# Patient Record
Sex: Female | Born: 1965
Health system: Southern US, Community
[De-identification: ages and names within clinical notes are randomized; demographics above are authoritative.]

## PROBLEM LIST (undated history)

## (undated) DIAGNOSIS — J849 Interstitial pulmonary disease, unspecified: Secondary | ICD-10-CM

## (undated) DIAGNOSIS — F32A Depression, unspecified: Secondary | ICD-10-CM

## (undated) DIAGNOSIS — M3320 Polymyositis, organ involvement unspecified: Secondary | ICD-10-CM

## (undated) DIAGNOSIS — G43909 Migraine, unspecified, not intractable, without status migrainosus: Secondary | ICD-10-CM

## (undated) DIAGNOSIS — M797 Fibromyalgia: Secondary | ICD-10-CM

## (undated) DIAGNOSIS — R011 Cardiac murmur, unspecified: Secondary | ICD-10-CM

## (undated) DIAGNOSIS — R0600 Dyspnea, unspecified: Secondary | ICD-10-CM

## (undated) DIAGNOSIS — E559 Vitamin D deficiency, unspecified: Secondary | ICD-10-CM

## (undated) DIAGNOSIS — I341 Nonrheumatic mitral (valve) prolapse: Secondary | ICD-10-CM

## (undated) DIAGNOSIS — I773 Arterial fibromuscular dysplasia: Secondary | ICD-10-CM

## (undated) DIAGNOSIS — R0902 Hypoxemia: Secondary | ICD-10-CM

## (undated) DIAGNOSIS — N920 Excessive and frequent menstruation with regular cycle: Secondary | ICD-10-CM

## (undated) DIAGNOSIS — R42 Dizziness and giddiness: Secondary | ICD-10-CM

## (undated) DIAGNOSIS — R7989 Other specified abnormal findings of blood chemistry: Secondary | ICD-10-CM

## (undated) DIAGNOSIS — R945 Abnormal results of liver function studies: Secondary | ICD-10-CM

## (undated) DIAGNOSIS — M069 Rheumatoid arthritis, unspecified: Secondary | ICD-10-CM

## (undated) DIAGNOSIS — I10 Essential (primary) hypertension: Secondary | ICD-10-CM

## (undated) DIAGNOSIS — E538 Deficiency of other specified B group vitamins: Secondary | ICD-10-CM

## (undated) DIAGNOSIS — M1990 Unspecified osteoarthritis, unspecified site: Secondary | ICD-10-CM

## (undated) DIAGNOSIS — M2550 Pain in unspecified joint: Secondary | ICD-10-CM

## (undated) DIAGNOSIS — J8410 Pulmonary fibrosis, unspecified: Secondary | ICD-10-CM

## (undated) DIAGNOSIS — G4730 Sleep apnea, unspecified: Secondary | ICD-10-CM

## (undated) DIAGNOSIS — F419 Anxiety disorder, unspecified: Secondary | ICD-10-CM

## (undated) HISTORY — DX: Rheumatoid arthritis, unspecified: M06.9

## (undated) HISTORY — DX: Migraine, unspecified, not intractable, without status migrainosus: G43.909

## (undated) HISTORY — DX: Nonrheumatic mitral (valve) prolapse: I34.1

## (undated) HISTORY — DX: Excessive and frequent menstruation with regular cycle: N92.0

## (undated) HISTORY — DX: Pain in unspecified joint: M25.50

## (undated) HISTORY — DX: Hypoxemia: R09.02

## (undated) HISTORY — DX: Essential (primary) hypertension: I10

## (undated) HISTORY — DX: Other specified abnormal findings of blood chemistry: R79.89

## (undated) HISTORY — DX: Anxiety disorder, unspecified: F41.9

## (undated) HISTORY — DX: Interstitial pulmonary disease, unspecified: J84.9

## (undated) HISTORY — DX: Sleep apnea, unspecified: G47.30

## (undated) HISTORY — DX: Dyspnea, unspecified: R06.00

## (undated) HISTORY — DX: Arterial fibromuscular dysplasia: I77.3

## (undated) HISTORY — DX: Polymyositis, organ involvement unspecified: M33.20

## (undated) HISTORY — DX: Depression, unspecified: F32.A

## (undated) HISTORY — DX: Abnormal results of liver function studies: R94.5

## (undated) HISTORY — DX: Cardiac murmur, unspecified: R01.1

## (undated) HISTORY — DX: Dizziness and giddiness: R42

## (undated) HISTORY — DX: Vitamin D deficiency, unspecified: E55.9

## (undated) HISTORY — DX: Fibromyalgia: M79.7

## (undated) HISTORY — DX: Pulmonary fibrosis, unspecified: J84.10

## (undated) HISTORY — DX: Unspecified osteoarthritis, unspecified site: M19.90

## (undated) HISTORY — DX: Deficiency of other specified B group vitamins: E53.8

---

## 2000-03-05 ENCOUNTER — Encounter: Admission: RE | Admit: 2000-03-05 | Discharge: 2000-03-05 | Payer: Self-pay | Admitting: Family Medicine

## 2000-03-05 ENCOUNTER — Encounter: Payer: Self-pay | Admitting: Family Medicine

## 2000-07-09 ENCOUNTER — Encounter: Admission: RE | Admit: 2000-07-09 | Discharge: 2000-07-09 | Payer: Self-pay | Admitting: Orthopedic Surgery

## 2000-07-09 ENCOUNTER — Encounter: Payer: Self-pay | Admitting: Orthopedic Surgery

## 2000-08-06 HISTORY — PX: CARDIAC CATHETERIZATION: SHX172

## 2000-09-20 ENCOUNTER — Ambulatory Visit (HOSPITAL_COMMUNITY): Admission: RE | Admit: 2000-09-20 | Discharge: 2000-09-20 | Payer: Self-pay | Admitting: Family Medicine

## 2001-05-05 ENCOUNTER — Observation Stay (HOSPITAL_COMMUNITY): Admission: EM | Admit: 2001-05-05 | Discharge: 2001-05-06 | Payer: Self-pay | Admitting: *Deleted

## 2001-05-05 ENCOUNTER — Encounter: Payer: Self-pay | Admitting: *Deleted

## 2001-05-30 ENCOUNTER — Ambulatory Visit (HOSPITAL_COMMUNITY): Admission: RE | Admit: 2001-05-30 | Discharge: 2001-05-30 | Payer: Self-pay | Admitting: *Deleted

## 2001-05-30 ENCOUNTER — Encounter: Payer: Self-pay | Admitting: *Deleted

## 2001-07-07 ENCOUNTER — Ambulatory Visit (HOSPITAL_COMMUNITY): Admission: RE | Admit: 2001-07-07 | Discharge: 2001-07-07 | Payer: Self-pay | Admitting: *Deleted

## 2001-07-07 ENCOUNTER — Encounter: Payer: Self-pay | Admitting: Family Medicine

## 2004-11-30 ENCOUNTER — Encounter: Admission: RE | Admit: 2004-11-30 | Discharge: 2004-11-30 | Payer: Self-pay | Admitting: Family Medicine

## 2005-02-27 ENCOUNTER — Encounter: Admission: RE | Admit: 2005-02-27 | Discharge: 2005-02-27 | Payer: Self-pay | Admitting: Family Medicine

## 2005-03-23 ENCOUNTER — Encounter: Admission: RE | Admit: 2005-03-23 | Discharge: 2005-03-23 | Payer: Self-pay | Admitting: Family Medicine

## 2005-03-28 ENCOUNTER — Ambulatory Visit: Payer: Self-pay | Admitting: Pulmonary Disease

## 2005-04-02 ENCOUNTER — Ambulatory Visit: Admission: RE | Admit: 2005-04-02 | Discharge: 2005-04-02 | Payer: Self-pay | Admitting: Pulmonary Disease

## 2005-04-10 ENCOUNTER — Ambulatory Visit: Payer: Self-pay | Admitting: Pulmonary Disease

## 2005-05-07 ENCOUNTER — Ambulatory Visit: Payer: Self-pay | Admitting: Pulmonary Disease

## 2005-06-13 ENCOUNTER — Ambulatory Visit: Payer: Self-pay | Admitting: Pulmonary Disease

## 2005-07-27 ENCOUNTER — Ambulatory Visit: Payer: Self-pay | Admitting: Gastroenterology

## 2005-08-08 ENCOUNTER — Ambulatory Visit: Payer: Self-pay | Admitting: Gastroenterology

## 2005-08-20 ENCOUNTER — Ambulatory Visit: Payer: Self-pay | Admitting: Pulmonary Disease

## 2005-11-13 ENCOUNTER — Encounter: Admission: RE | Admit: 2005-11-13 | Discharge: 2005-11-13 | Payer: Self-pay | Admitting: Rheumatology

## 2005-12-27 ENCOUNTER — Ambulatory Visit: Payer: Self-pay | Admitting: Pulmonary Disease

## 2007-03-28 ENCOUNTER — Encounter: Admission: RE | Admit: 2007-03-28 | Discharge: 2007-03-28 | Payer: Self-pay | Admitting: Rheumatology

## 2008-01-29 ENCOUNTER — Encounter: Admission: RE | Admit: 2008-01-29 | Discharge: 2008-01-29 | Payer: Self-pay | Admitting: Rheumatology

## 2008-02-10 DIAGNOSIS — J479 Bronchiectasis, uncomplicated: Secondary | ICD-10-CM | POA: Insufficient documentation

## 2008-02-11 ENCOUNTER — Ambulatory Visit: Payer: Self-pay | Admitting: Pulmonary Disease

## 2008-02-11 DIAGNOSIS — I059 Rheumatic mitral valve disease, unspecified: Secondary | ICD-10-CM | POA: Insufficient documentation

## 2008-02-11 DIAGNOSIS — M3320 Polymyositis, organ involvement unspecified: Secondary | ICD-10-CM | POA: Insufficient documentation

## 2008-02-17 ENCOUNTER — Ambulatory Visit: Payer: Self-pay | Admitting: Internal Medicine

## 2008-02-26 ENCOUNTER — Telehealth: Payer: Self-pay | Admitting: Pulmonary Disease

## 2008-02-29 ENCOUNTER — Emergency Department (HOSPITAL_COMMUNITY): Admission: EM | Admit: 2008-02-29 | Discharge: 2008-02-29 | Payer: Self-pay | Admitting: Emergency Medicine

## 2008-03-03 ENCOUNTER — Ambulatory Visit: Payer: Self-pay | Admitting: Pulmonary Disease

## 2008-03-05 ENCOUNTER — Ambulatory Visit: Payer: Self-pay | Admitting: Pulmonary Disease

## 2008-03-05 DIAGNOSIS — R42 Dizziness and giddiness: Secondary | ICD-10-CM | POA: Insufficient documentation

## 2008-04-20 ENCOUNTER — Ambulatory Visit: Payer: Self-pay | Admitting: Pulmonary Disease

## 2008-05-19 ENCOUNTER — Ambulatory Visit: Payer: Self-pay | Admitting: Pulmonary Disease

## 2008-07-06 LAB — CONVERTED CEMR LAB: Pap Smear: NEGATIVE

## 2008-07-16 ENCOUNTER — Encounter: Payer: Self-pay | Admitting: Pulmonary Disease

## 2008-08-02 ENCOUNTER — Ambulatory Visit: Payer: Self-pay | Admitting: Pulmonary Disease

## 2008-08-06 HISTORY — PX: OTHER SURGICAL HISTORY: SHX169

## 2008-08-31 ENCOUNTER — Encounter (HOSPITAL_COMMUNITY): Payer: Self-pay | Admitting: Obstetrics and Gynecology

## 2008-08-31 ENCOUNTER — Ambulatory Visit (HOSPITAL_COMMUNITY): Admission: RE | Admit: 2008-08-31 | Discharge: 2008-08-31 | Payer: Self-pay | Admitting: Obstetrics and Gynecology

## 2008-08-31 HISTORY — PX: DILATION AND CURETTAGE OF UTERUS: SHX78

## 2008-09-10 ENCOUNTER — Ambulatory Visit: Payer: Self-pay | Admitting: Pulmonary Disease

## 2008-09-10 ENCOUNTER — Ambulatory Visit: Payer: Self-pay | Admitting: Internal Medicine

## 2008-09-14 ENCOUNTER — Encounter: Payer: Self-pay | Admitting: Adult Health

## 2008-09-14 ENCOUNTER — Ambulatory Visit: Payer: Self-pay | Admitting: Pulmonary Disease

## 2008-09-17 ENCOUNTER — Telehealth: Payer: Self-pay | Admitting: Adult Health

## 2008-09-17 ENCOUNTER — Encounter (INDEPENDENT_AMBULATORY_CARE_PROVIDER_SITE_OTHER): Payer: Self-pay

## 2008-11-30 ENCOUNTER — Ambulatory Visit: Payer: Self-pay | Admitting: Pulmonary Disease

## 2008-12-01 ENCOUNTER — Ambulatory Visit: Payer: Self-pay | Admitting: Cardiology

## 2008-12-21 ENCOUNTER — Ambulatory Visit: Payer: Self-pay | Admitting: Pulmonary Disease

## 2009-02-22 ENCOUNTER — Encounter: Admission: RE | Admit: 2009-02-22 | Discharge: 2009-02-22 | Payer: Self-pay | Admitting: Orthopedic Surgery

## 2009-04-18 ENCOUNTER — Ambulatory Visit: Payer: Self-pay | Admitting: Pulmonary Disease

## 2009-04-19 LAB — CONVERTED CEMR LAB
ALT: 37 units/L — ABNORMAL HIGH (ref 0–35)
BUN: 10 mg/dL (ref 6–23)
Bilirubin, Direct: 0.1 mg/dL (ref 0.0–0.3)
Calcium: 9.2 mg/dL (ref 8.4–10.5)
Creatinine, Ser: 0.7 mg/dL (ref 0.4–1.2)
Eosinophils Absolute: 0.1 10*3/uL (ref 0.0–0.7)
Eosinophils Relative: 1.5 % (ref 0.0–5.0)
GFR calc non Af Amer: 117.21 mL/min (ref >60)
Glucose, Bld: 69 mg/dL — ABNORMAL LOW (ref 70–99)
Lymphocytes Relative: 16 % (ref 12.0–46.0)
MCHC: 33.6 g/dL (ref 30.0–36.0)
Monocytes Absolute: 0.6 10*3/uL (ref 0.1–1.0)
Neutro Abs: 6.5 10*3/uL (ref 1.4–7.7)
Platelets: 230 10*3/uL (ref 150.0–400.0)
Potassium: 3.7 meq/L (ref 3.5–5.1)
RBC: 4.41 M/uL (ref 3.87–5.11)
Total Protein: 7.7 g/dL (ref 6.0–8.3)

## 2009-05-25 ENCOUNTER — Ambulatory Visit: Payer: Self-pay | Admitting: Pulmonary Disease

## 2009-06-20 ENCOUNTER — Ambulatory Visit: Payer: Self-pay | Admitting: Internal Medicine

## 2009-06-20 DIAGNOSIS — M797 Fibromyalgia: Secondary | ICD-10-CM | POA: Insufficient documentation

## 2009-07-07 ENCOUNTER — Encounter: Payer: Self-pay | Admitting: Pulmonary Disease

## 2009-07-07 ENCOUNTER — Ambulatory Visit: Payer: Self-pay

## 2009-07-07 ENCOUNTER — Encounter: Payer: Self-pay | Admitting: Internal Medicine

## 2009-07-07 ENCOUNTER — Ambulatory Visit (HOSPITAL_COMMUNITY): Admission: RE | Admit: 2009-07-07 | Discharge: 2009-07-07 | Payer: Self-pay | Admitting: Internal Medicine

## 2009-07-07 ENCOUNTER — Ambulatory Visit: Payer: Self-pay | Admitting: Cardiology

## 2009-07-08 ENCOUNTER — Ambulatory Visit: Payer: Self-pay | Admitting: Internal Medicine

## 2009-07-08 LAB — CONVERTED CEMR LAB
Albumin: 3.9 g/dL (ref 3.5–5.2)
BUN: 7 mg/dL (ref 6–23)
Bilirubin Urine: NEGATIVE
Calcium: 9.3 mg/dL (ref 8.4–10.5)
Cholesterol: 158 mg/dL (ref 0–200)
Creatinine, Ser: 0.6 mg/dL (ref 0.4–1.2)
GFR calc non Af Amer: 139.89 mL/min (ref >60)
Glucose, Bld: 74 mg/dL (ref 70–99)
HDL: 58 mg/dL (ref >39.00)
Hemoglobin: 13 g/dL (ref 12.0–15.0)
Ketones, ur: NEGATIVE mg/dL
LDL Cholesterol: 93 mg/dL (ref 0–99)
MCHC: 33.5 g/dL (ref 30.0–36.0)
MCV: 88.4 fL (ref 78.0–100.0)
Potassium: 4.2 meq/L (ref 3.5–5.1)
RBC: 4.37 M/uL (ref 3.87–5.11)
RDW: 13.7 % (ref 11.5–14.6)
Specific Gravity, Urine: 1.01 (ref 1.000–1.030)
Total Bilirubin: 0.7 mg/dL (ref 0.3–1.2)
Total Protein: 7.4 g/dL (ref 6.0–8.3)
Triglycerides: 37 mg/dL (ref 0.0–149.0)
Urine Glucose: NEGATIVE mg/dL
VLDL: 7.4 mg/dL (ref 0.0–40.0)
Vit D, 25-Hydroxy: 48 ng/mL (ref 30–89)
WBC: 7.6 10*3/uL (ref 4.5–10.5)

## 2009-07-11 ENCOUNTER — Encounter: Payer: Self-pay | Admitting: Internal Medicine

## 2009-07-12 ENCOUNTER — Ambulatory Visit: Payer: Self-pay | Admitting: Internal Medicine

## 2009-07-12 DIAGNOSIS — Z8639 Personal history of other endocrine, nutritional and metabolic disease: Secondary | ICD-10-CM

## 2009-07-12 DIAGNOSIS — Z862 Personal history of diseases of the blood and blood-forming organs and certain disorders involving the immune mechanism: Secondary | ICD-10-CM | POA: Insufficient documentation

## 2009-07-12 LAB — CONVERTED CEMR LAB
HCV Ab: NEGATIVE
Hep A IgM: NEGATIVE
Hep B C IgM: NEGATIVE
Hepatitis B Surface Ag: NEGATIVE

## 2009-07-15 ENCOUNTER — Encounter: Payer: Self-pay | Admitting: Internal Medicine

## 2009-11-22 ENCOUNTER — Encounter: Payer: Self-pay | Admitting: Pulmonary Disease

## 2009-12-27 ENCOUNTER — Encounter: Payer: Self-pay | Admitting: Pulmonary Disease

## 2009-12-27 ENCOUNTER — Encounter: Payer: Self-pay | Admitting: Internal Medicine

## 2009-12-30 ENCOUNTER — Ambulatory Visit: Payer: Self-pay | Admitting: Pulmonary Disease

## 2010-02-15 ENCOUNTER — Ambulatory Visit: Payer: Self-pay | Admitting: Internal Medicine

## 2010-02-15 DIAGNOSIS — G43909 Migraine, unspecified, not intractable, without status migrainosus: Secondary | ICD-10-CM | POA: Insufficient documentation

## 2010-03-21 ENCOUNTER — Encounter: Payer: Self-pay | Admitting: Internal Medicine

## 2010-03-31 ENCOUNTER — Telehealth: Payer: Self-pay | Admitting: Internal Medicine

## 2010-05-26 ENCOUNTER — Ambulatory Visit: Payer: Self-pay | Admitting: Internal Medicine

## 2010-05-26 DIAGNOSIS — N3941 Urge incontinence: Secondary | ICD-10-CM | POA: Insufficient documentation

## 2010-05-26 LAB — CONVERTED CEMR LAB
Hemoglobin, Urine: NEGATIVE
Specific Gravity, Urine: 1.02 (ref 1.000–1.030)
Total Protein, Urine: NEGATIVE mg/dL
Urine Glucose: NEGATIVE mg/dL
pH: 5.5 (ref 5.0–8.0)

## 2010-06-01 ENCOUNTER — Ambulatory Visit: Payer: Self-pay | Admitting: Pulmonary Disease

## 2010-07-11 ENCOUNTER — Encounter: Payer: Self-pay | Admitting: Pulmonary Disease

## 2010-07-11 ENCOUNTER — Encounter: Payer: Self-pay | Admitting: Internal Medicine

## 2010-08-27 ENCOUNTER — Encounter: Payer: Self-pay | Admitting: Gastroenterology

## 2010-09-07 NOTE — Letter (Signed)
Summary: Aundra Dubin MD  Aundra Dubin MD   Imported By: Sherian Rein 12/21/2009 13:30:31  _____________________________________________________________________  External Attachment:    Type:   Image     Comment:   External Document

## 2010-09-07 NOTE — Letter (Signed)
Summary: [name] MD  [name] MD   Imported By: Sherian Rein 01/17/2010 10:44:36  _____________________________________________________________________  External Attachment:    Type:   Image     Comment:   External Document

## 2010-09-07 NOTE — Letter (Signed)
Summary: Aundra Dubin MD  Aundra Dubin MD   Imported By: Lester [place] 07/24/2010 07:47:06  _____________________________________________________________________  External Attachment:    Type:   Image     Comment:   External Document

## 2010-09-07 NOTE — Letter (Signed)
Summary: [name] MD  [name] MD   Imported By: Lennie Odor 07/20/2010 11:24:23  _____________________________________________________________________  External Attachment:    Type:   Image     Comment:   External Document

## 2010-09-07 NOTE — Letter (Signed)
Summary: [name] MD  [name] MD   Imported By: Sherian Rein 03/29/2010 13:44:51  _____________________________________________________________________  External Attachment:    Type:   Image     Comment:   External Document

## 2010-09-07 NOTE — Assessment & Plan Note (Signed)
Summary: 4-6 months/apc   Visit Type:  Follow-up Copy to:  [name] Primary Chelsei Mcchesney/Referring Jill Harper:  Newt Lukes MD  CC:  Bronchiectasis follow-up...the patient says her mucus production increased after she stopped the Prednisone therapy...she is not using Symbicort on a daily basis.  History of Present Illness: I saw [name] in follow up for her bronchiectasis, polymyositis, and dyspnea.  She has noticed some increase in cough and sputum since she stopped prednisone.  She is bringing up clear sputum.  She gets occasional chest tightness and wheeze with exertion.  She has not been using her symbicort on a regular basis.  She has not been using her proventil.  Last chest xray Dec 30, 2009.  Current Medications (verified): 1)  Symbicort 80-4.5 Mcg/act Aero (Budesonide-Formoterol Fumarate) .... Two Puffs Two Times A Day 2)  Proventil Hfa 108 (90 Base) Mcg/act  Aers (Albuterol Sulfate) .... 2 Puffs Qid As Needed 3)  Methotrexate 2.5 Mg  Tabs (Methotrexate Sodium) .... Take 8 Pills Every Saturday. 4)  Folic Acid 1 Mg  Tabs (Folic Acid) .... Take One Tablet By Mouth Once Daily 5)  Atenolol 25 Mg  Tabs (Atenolol) .... Take 1/2 Tab By Mouth Once Daily As Needed 6)  Tramadol Hcl 50 Mg Tabs (Tramadol Hcl) .[name].. 1 By Mouth 3-4 Times Daily As Needed 7)  Cyclobenzaprine Hcl 10 Mg  Tabs (Cyclobenzaprine Hcl) .... 1/2 Tab At Bedtime As Needed 8)  Midrin 325-65-100 Mg  Caps (Apap-Isometheptene-Dichloral) .... Tale One Capsule By Mouth At Onset of Headache; Repeat in 2 Hours. Max Is 8 Capsules Per Day. 9)  Meclizine Hcl 25 Mg  Chew (Meclizine Hcl) .... Take One Tablet By Mouth Every 6 Hours As Needed. 10)  Mucinex 600 Mg Xr12h-Tab (Guaifenesin) .... One By Mouth Two Times A Day As Needed Cough 11)  Imitrex 50 Mg Tabs (Sumatriptan Succinate) .[name].. 1 By Mouth At Onset of Migraine, May Repeat in 2hour If Needed - Max 200mg /24h 12)  Toviaz 4 Mg Xr24h-Tab (Fesoterodine Fumarate) .[name].. 1 By  Mouth Once Daily  Allergies (verified): 1)  ! Sulfa  Past History:  Past Medical History: Reviewed history from 05/26/2010 and no changes required. Bronchiectasis      - PFT 03/03/08 FEV1 3.18 (103%), FVC 3.83 (97%), FEV1% 83, TLC 4.85 (83%), DLCO 59%, no BD      - PFT 12/21/08 FEV1 3.22 (109%), FVC 3.71 (97%), FEV1% 87, TLC 4.68 (83%), DLCO 66%, no BD Polymyositis      - Dr. [name] MVP Fibromyalgia Abnormal liver function tests Menorrhagia OAB    MD roster - rheum - [facility] gyn-[facility] - Sood  Past Surgical History: Reviewed history from 05/25/2009 and no changes required. Normal cardiac cath 2002 D & C       - 08/31/08 Dr. Zelphia Cairo  Review of Systems       The patient complains of shortness of breath with activity, productive cough, and non-productive cough.  The patient denies shortness of breath at rest, coughing up blood, chest pain, difficulty swallowing, nasal congestion/difficulty breathing through nose, rash, change in color of mucus, and fever.    Vital Signs:  Patient profile:   45 year old female Height:      67 inches (170.18 cm) Weight:      215.38 pounds (97.90 kg) BMI:     33.86 O2 Sat:      98 % on Room air Temp:     98.2 degrees F (36.78 degrees C) oral  Pulse rate:   80 / minute BP sitting:   118 / 68  (left arm) Cuff size:   regular  Vitals Entered By: Michel Bickers CMA (June 01, 2010 2:32 PM)  O2 Sat at Rest %:  98 O2 Flow:  Room air CC: Bronchiectasis follow-up...the patient says her mucus production increased after she stopped the Prednisone therapy...she is not using Symbicort on a daily basis Comments Medications reviewed with patient Michel Bickers [facility]  June 01, 2010 2:33 PM   Physical Exam  General:  normal appearance and obese.   Nose:  no sinus tenderness, prominent turbinates Mouth:  MP 3, 2+ tonsills Neck:  no JVD.   Lungs:  decreased breath sounds, basilar crackles partially clear with cough, no  wheezing Heart:  regular rhythm, normal rate, and no murmurs.   Extremities:  no edema Cervical Nodes:  no significant adenopathy Psych:  alert and cooperative; normal mood and affect; normal attention span and concentration   Impression & Recommendations:  Problem # 1:  BRONCHIECTASIS (ICD-494.0) Will stop symbicort and restart Qvar.  Explained instructions on how to use this.  Will use proventil as needed.  Will give flu shot today.  Problem # 2:  POLYMYOSITIS (ICD-710.4)  She is maintained on MTX er rheumatology.  Medications Added to Medication List This Visit: 1)  Qvar 80 Mcg/act Aers (Beclomethasone dipropionate) .... Two puffs two times a day for 2 weeks, and if okay then 2 puffs at bedtime  Complete Medication List: 1)  Proventil Hfa 108 (90 Base) Mcg/act Aers (Albuterol sulfate) .... 2 puffs qid as needed 2)  Methotrexate 2.5 Mg Tabs (Methotrexate sodium) .... Take 8 pills every saturday. 3)  Folic Acid 1 Mg Tabs (Folic acid) .... Take one tablet by mouth once daily 4)  Atenolol 25 Mg Tabs (Atenolol) .... Take 1/2 tab by mouth once daily as needed 5)  Tramadol Hcl 50 Mg Tabs (Tramadol hcl) .[name].. 1 by mouth 3-4 times daily as needed 6)  Cyclobenzaprine Hcl 10 Mg Tabs (Cyclobenzaprine hcl) .... 1/2 tab at bedtime as needed 7)  Midrin 325-65-100 Mg Caps (Apap-isometheptene-dichloral) .... Tale one capsule by mouth at onset of headache; repeat in 2 hours. max is 8 capsules per day. 8)  Meclizine Hcl 25 Mg Chew (Meclizine hcl) .... Take one tablet by mouth every 6 hours as needed. 9)  Mucinex 600 Mg Xr12h-tab (Guaifenesin) .... One by mouth two times a day as needed cough 10)  Imitrex 50 Mg Tabs (Sumatriptan succinate) .[name].. 1 by mouth at onset of migraine, may repeat in 2hour if needed - max 200mg /24h 11)  Toviaz 4 Mg Xr24h-tab (Fesoterodine fumarate) .[name].. 1 by mouth once daily 12)  Qvar 80 Mcg/act Aers (Beclomethasone dipropionate) .... Two puffs two times a day for 2 weeks, and if  okay then 2 puffs at bedtime  Other Orders: Est. Patient Level III ([address])  Patient Instructions: 1)  Stop symbicort 2)  Qvar two puffs two times a day for two weeks, and if okay then two puffs at bedtime.  Rinse your mouth after using Qvar 3)  Proventil two puffs as needed  4)  Flu shot today 5)  Follow up in 4 to 6 months Prescriptions: PROVENTIL HFA 108 (90 BASE) MCG/ACT  AERS (ALBUTEROL SULFATE) 2 puffs qid as needed  #1 x 6   Entered and Authorized by:   [name] MD   Signed by:   [name] MD on 06/01/2010   Method used:   Electronically to  [facility] [address] [phone]* (retail)       [address]       [address]       [place], [place]  [address]       Ph: [phone]       Fax: [phone]   RxID:   [id number] QVAR 80 MCG/ACT AERS (BECLOMETHASONE DIPROPIONATE) two puffs two times a day for 2 weeks, and if okay then 2 puffs at bedtime  #1 x 6   Entered and Authorized by:   [name] MD   Signed by:   [name] MD on 06/01/2010   Method used:   Electronically to        [facility] [phone]* (retail)       [address]       [address]       [place], [place]  [address]       Ph: [phone]       Fax: [phone]   RxID:   [id number]   Appended Document: flu update Flu Vaccine Consent Questions     Do you have a history of severe allergic reactions to this vaccine? no    Any prior history of allergic reactions to egg and/or gelatin? no    Do you have a sensitivity to the preservative Thimersol? no    Do you have a past history of Guillan-Barre Syndrome? no    Do you currently have an acute febrile illness? no    Have you ever had a severe reaction to latex? no    Vaccine information given and explained to patient? yes    Are you currently pregnant? no    Lot Number:[id number]   Exp Date:02/03/2011   Site Given  Left Deltoid IM  Mindy Silva  June 01, 2010 3:00 PM    Clinical Lists Changes  Orders: Added new Service  order of Admin 1st Vaccine (17616) - Signed Added new Service order of Flu Vaccine 56yrs + [phone]) - Signed Observations: Added new observation of FLU VAX VIS: 02/28/10 version (06/01/2010 14:59) Added new observation of FLU VAXLOT: [id number] (06/01/2010 14:59) Added new observation of FLU VAXMFR: [employer] (06/01/2010 14:59) Added new observation of FLU VAX EXP: 02/03/2011 (06/01/2010 14:59) Added new observation of FLU VAX DSE: 0.73ml (06/01/2010 14:59) Added new observation of FLU VAX: Fluvax 3+ (06/01/2010 14:59)

## 2010-09-07 NOTE — Assessment & Plan Note (Signed)
Summary: rov/apc   Visit Type:  Follow-up Copy to:  [name] Primary Provider/Referring Provider:  Newt Lukes MD  CC:  Bronchiectasis.  The patient c/o occ SOB. No change in her cough or mucus production. She says she is only using the Symbicort once a week and is not using her flutter valve.[name]  History of Present Illness: I saw [name] in follow up for her bronchiectasis, polymyositis, and dyspnea.  She was doing okay with her breathing until a few weeks ago.  This was associated with an attempt to decrease her MTX dose.  She developed worsening muscle pain as well.  She has been having trouble with her LFT's and this was felt to be related to MTX.  She was started on prednisone, and will remain on this through June.  She gets occasional cough.  She will bring up sputum occasionally and this is clear to yellow.  She is not having fever, chest pain, or wheezing.  She only uses her symbicort sporadically, and has not been using her proventil.    Current Medications (verified): 1)  Symbicort 80-4.5 Mcg/act Aero (Budesonide-Formoterol Fumarate) .... Two Puffs Two Times A Day 2)  Proventil Hfa 108 (90 Base) Mcg/act  Aers (Albuterol Sulfate) .... 2 Puffs Qid As Needed 3)  Methotrexate 2.5 Mg  Tabs (Methotrexate Sodium) .... Take 8 Pills Every Saturday. 4)  Folic Acid 1 Mg  Tabs (Folic Acid) .... Take One Tablet By Mouth Once Daily 5)  Atenolol 25 Mg  Tabs (Atenolol) .... Take 1/2 Tab By Mouth Once Daily As Needed 6)  Tramadol Hcl 50 Mg Tabs (Tramadol Hcl) .[name].. 1 By Mouth 3-4 Times Daily As Needed 7)  Cyclobenzaprine Hcl 10 Mg  Tabs (Cyclobenzaprine Hcl) .... 1/2 Tab At Bedtime As Needed 8)  Midrin 325-65-100 Mg  Caps (Apap-Isometheptene-Dichloral) .... Tale One Capsule By Mouth At Onset of Headache; Repeat in 2 Hours. Max Is 8 Capsules Per Day. 9)  Meclizine Hcl 25 Mg  Chew (Meclizine Hcl) .... Take One Tablet By Mouth Every 6 Hours As Needed. 10)  Mucinex 600 Mg Xr12h-Tab  (Guaifenesin) .... One By Mouth Two Times A Day As Needed Cough 11)  Prednisone 5 Mg Tabs (Prednisone) .[name].. 10 Mg Every Other Day Alternating With 5 Mg Every Other Day  Allergies (verified): 1)  ! Sulfa  Past History:  Past Medical History: Bronchiectasis      - PFT 03/03/08 FEV1 3.18 (103%), FVC 3.83 (97%), FEV1% 83, TLC 4.85 (83%), DLCO 59%, no BD      - PFT 12/21/08 FEV1 3.22 (109%), FVC 3.71 (97%), FEV1% 87, TLC 4.68 (83%), DLCO 66%, no BD Polymyositis      - Dr. [name] MVP Fibromyalgia Abnormal liver function tests Menorrhagia  MD rooster - rheum - [facility]low gyn-[facility] - Jolyn Deshmukh  Past Surgical History: Reviewed history from 05/25/2009 and no changes required. Normal cardiac cath 2002 D & C       - 08/31/08 Dr. Zelphia Cairo  Vital Signs:  Patient profile:   45 year old female Height:      67 inches (170.18 cm) Weight:      220 pounds (100.00 kg) BMI:     34.58 O2 Sat:      99 % on Room air Temp:     98.3 degrees F (36.83 degrees C) oral Pulse rate:   83 / minute BP sitting:   110 / 74  (left arm) Cuff size:   regular  Vitals Entered  By: Michel Bickers CMA (Dec 30, 2009 3:10 PM)  O2 Sat at Rest %:  99 O2 Flow:  Room air CC: Bronchiectasis.  The patient c/o occ SOB. No change in her cough or mucus production. She says she is only using the Symbicort once a week and is not using her flutter valve. Comments Medications reviewed. Daytime phone number verified. Michel Bickers CMA  Dec 30, 2009 3:11 PM   Physical Exam  General:  normal appearance and obese.   Nose:  no sinus tenderness, prominent turbinates Mouth:  MP 3, 2+ tonsills Neck:  no JVD.   Lungs:  decreased breath sounds, basilar crackles, no wheezing Heart:  regular rhythm, normal rate, and no murmurs.   Extremities:  no edema Cervical Nodes:  no significant adenopathy   Impression & Recommendations:  Problem # 1:  BRONCHIECTASIS (ICD-494.0) She may be getting benefit from her  prednisone use.  I will repeat her chest xray today.  She can use symbicort and proventil as needed.  Advised her to monitor her symptoms as she comes down on the dose of her prednisone.  Problem # 2:  POLYMYOSITIS (ICD-710.4) She will f/u with rheumatology.  Medications Added to Medication List This Visit: 1)  Tramadol Hcl 50 Mg Tabs (Tramadol hcl) .[name].. 1 by mouth 3-4 times daily as needed 2)  Prednisone 5 Mg Tabs (Prednisone) .[name].. 10 mg every other day alternating with 5 mg every other day  Complete Medication List: 1)  Symbicort 80-4.5 Mcg/act Aero (Budesonide-formoterol fumarate) .... Two puffs two times a day 2)  Proventil Hfa 108 (90 Base) Mcg/act Aers (Albuterol sulfate) .... 2 puffs qid as needed 3)  Methotrexate 2.5 Mg Tabs (Methotrexate sodium) .... Take 8 pills every saturday. 4)  Folic Acid 1 Mg Tabs (Folic acid) .... Take one tablet by mouth once daily 5)  Atenolol 25 Mg Tabs (Atenolol) .... Take 1/2 tab by mouth once daily as needed 6)  Tramadol Hcl 50 Mg Tabs (Tramadol hcl) .[name].. 1 by mouth 3-4 times daily as needed 7)  Cyclobenzaprine Hcl 10 Mg Tabs (Cyclobenzaprine hcl) .... 1/2 tab at bedtime as needed 8)  Midrin 325-65-100 Mg Caps (Apap-isometheptene-dichloral) .... Tale one capsule by mouth at onset of headache; repeat in 2 hours. max is 8 capsules per day. 9)  Meclizine Hcl 25 Mg Chew (Meclizine hcl) .... Take one tablet by mouth every 6 hours as needed. 10)  Mucinex 600 Mg Xr12h-tab (Guaifenesin) .... One by mouth two times a day as needed cough 11)  Prednisone 5 Mg Tabs (Prednisone) .[name].. 10 mg every other day alternating with 5 mg every other day  Other Orders: Est. Patient Level III (53664) T-2 View CXR (71020TC)  Patient Instructions: 1)  Chest xray today 2)  Follow up in 4 to 6 months

## 2010-09-07 NOTE — Letter (Signed)
Summary: Aundra Dubin MD  Aundra Dubin MD   Imported By: Lester Smyer 01/13/2010 08:15:38  _____________________________________________________________________  External Attachment:    Type:   Image     Comment:   External Document

## 2010-09-07 NOTE — Assessment & Plan Note (Signed)
Summary: problem controlling bladder/cd   Vital Signs:  Patient profile:   45 year old female Height:      67 inches Weight:      212 pounds BMI:     33.32 O2 Sat:      99 % on Room air Temp:     98.7 degrees F oral Pulse rate:   79 / minute BP sitting:   112 / 70  (left arm) Cuff size:   regular  Vitals Entered By: Alysia Penna (May 26, 2010 11:14 AM)  O2 Flow:  Room air CC: pt c/o of periods of not being able to control her bladder. /cp sma   Primary Care Provider:  Newt Lukes MD  CC:  pt c/o of periods of not being able to control her bladder. /cp sma.  History of Present Illness: c/o overactive bladder symptoms  onset 3 mo ago, course progressive episodes occur 1x/week or more describes as sudden urge assoc with episode of incontinence driving home due to symptoms  no dysuria or hematuria no recent change in meds -  no uro intervention or surg in past 12 mo no back pain, no leg weakness or numbness in "saddle"  Clinical Review Panels:  Immunizations   Last Tetanus Booster:  Tdap (07/12/2009)   Last Flu Vaccine:  Historical (05/15/2009)   Last Pneumovax:  Historical (08/07/2007)  CBC   WBC:  7.6 (07/08/2009)   RBC:  4.37 (07/08/2009)   Hgb:  13.0 (07/08/2009)   Hct:  38.6 (07/08/2009)   Platelets:  239.0 (07/08/2009)   MCV  88.4 (07/08/2009)   MCHC  33.5 (07/08/2009)   RDW  13.7 (07/08/2009)   PMN:  68.3 (07/08/2009)   Lymphs:  23.0 (07/08/2009)   Monos:  6.2 (07/08/2009)   Eosinophils:  1.8 (07/08/2009)   Basophil:  0.7 (07/08/2009)  Complete Metabolic Panel   Glucose:  74 (07/08/2009)   Sodium:  141 (07/08/2009)   Potassium:  4.2 (07/08/2009)   Chloride:  105 (07/08/2009)   CO2:  29 (07/08/2009)   BUN:  7 (07/08/2009)   Creatinine:  0.6 (07/08/2009)   Albumin:  3.9 (07/08/2009)   Total Protein:  7.4 (07/08/2009)   Calcium:  9.3 (07/08/2009)   Total Bili:  0.7 (07/08/2009)   Alk Phos:  62 (07/08/2009)   SGPT (ALT):  108  (07/08/2009)   SGOT (AST):  70 (07/08/2009)   Current Medications (verified): 1)  Symbicort 80-4.5 Mcg/act Aero (Budesonide-Formoterol Fumarate) .... Two Puffs Two Times A Day 2)  Proventil Hfa 108 (90 Base) Mcg/act  Aers (Albuterol Sulfate) .... 2 Puffs Qid As Needed 3)  Methotrexate 2.5 Mg  Tabs (Methotrexate Sodium) .... Take 8 Pills Every Saturday. 4)  Folic Acid 1 Mg  Tabs (Folic Acid) .... Take One Tablet By Mouth Once Daily 5)  Atenolol 25 Mg  Tabs (Atenolol) .... Take 1/2 Tab By Mouth Once Daily As Needed 6)  Tramadol Hcl 50 Mg Tabs (Tramadol Hcl) .[name].. 1 By Mouth 3-4 Times Daily As Needed 7)  Cyclobenzaprine Hcl 10 Mg  Tabs (Cyclobenzaprine Hcl) .... 1/2 Tab At Bedtime As Needed 8)  Midrin 325-65-100 Mg  Caps (Apap-Isometheptene-Dichloral) .... Tale One Capsule By Mouth At Onset of Headache; Repeat in 2 Hours. Max Is 8 Capsules Per Day. 9)  Meclizine Hcl 25 Mg  Chew (Meclizine Hcl) .... Take One Tablet By Mouth Every 6 Hours As Needed. 10)  Mucinex 600 Mg Xr12h-Tab (Guaifenesin) .... One By Mouth Two Times A Day As Needed  Cough 11)  Imitrex 50 Mg Tabs (Sumatriptan Succinate) .[name].. 1 By Mouth At Onset of Migraine, May Repeat in 2hour If Needed - Max 200mg /24h  Allergies (verified): 1)  ! Sulfa  Past History:  Past Medical History: Bronchiectasis      - PFT 03/03/08 FEV1 3.18 (103%), FVC 3.83 (97%), FEV1% 83, TLC 4.85 (83%), DLCO 59%, no BD      - PFT 12/21/08 FEV1 3.22 (109%), FVC 3.71 (97%), FEV1% 87, TLC 4.68 (83%), DLCO 66%, no BD Polymyositis      - Dr. [name] MVP Fibromyalgia Abnormal liver function tests Menorrhagia OAB    MD roster - rheum - Truslow gyn-[facility] - Sood  Review of Systems  The patient denies weight loss, abdominal pain, and hematuria.    Physical Exam  General:  overweight-appearing.  alert, well-developed, well-nourished, and cooperative to examination.    Lungs:  normal respiratory effort, no intercostal retractions or use  of accessory muscles; normal breath sounds bilaterally - no crackles and no wheezes.    Heart:  normal rate, regular rhythm, no murmur, and no rub. BLE without edema.    Impression & Recommendations:  Problem # 1:  INCONTINENCE, URGE (ICD-788.31) OAB symptoms evident by hx -  check urine studies and treat if +infx start toviaz trial - samples and erx done if unimproved, refer for urodynamic testing - uro or ?gyn ([facility]) Orders: TLB-Udip w/ Micro (81001-URINE) T-Culture, Urine ([id number]) Prescription Created Electronically [id number])  Complete Medication List: 1)  Symbicort 80-4.5 Mcg/act Aero (Budesonide-formoterol fumarate) .... Two puffs two times a day 2)  Proventil Hfa 108 (90 Base) Mcg/act Aers (Albuterol sulfate) .... 2 puffs qid as needed 3)  Methotrexate 2.5 Mg Tabs (Methotrexate sodium) .... Take 8 pills every saturday. 4)  Folic Acid 1 Mg Tabs (Folic acid) .... Take one tablet by mouth once daily 5)  Atenolol 25 Mg Tabs (Atenolol) .... Take 1/2 tab by mouth once daily as needed 6)  Tramadol Hcl 50 Mg Tabs (Tramadol hcl) .[name].. 1 by mouth 3-4 times daily as needed 7)  Cyclobenzaprine Hcl 10 Mg Tabs (Cyclobenzaprine hcl) .... 1/2 tab at bedtime as needed 8)  Midrin 325-65-100 Mg Caps (Apap-isometheptene-dichloral) .... Tale one capsule by mouth at onset of headache; repeat in 2 hours. max is 8 capsules per day. 9)  Meclizine Hcl 25 Mg Chew (Meclizine hcl) .... Take one tablet by mouth every 6 hours as needed. 10)  Mucinex 600 Mg Xr12h-tab (Guaifenesin) .... One by mouth two times a day as needed cough 11)  Imitrex 50 Mg Tabs (Sumatriptan succinate) .[name].. 1 by mouth at onset of migraine, may repeat in 2hour if needed - max 200mg /24h 12)  Toviaz 4 Mg Xr24h-tab (Fesoterodine fumarate) .[name].. 1 by mouth once daily  Patient Instructions: 1)  it was good to see you today. 2)  test(s) ordered today - your results will be posted on the phone tree for review in 48-72 hours from the time of  test completion; call [phone] and enter your 9 digit MRN (listed above on this page, just below your name); if any changes need to be made or there are abnormal results, you will be contacted directly. 3)  start Toviaz for overactive bladder symptoms (urge incontence) - samples + coupon given today and your prescription has been electronically submitted to your pharmacy. Please take as directed. Contact our office if you believe you're having problems with the medication(s).  4)  If continued symptoms despite medication, let us know  and we can refer to urologist for "urodynamic" testing to help with this problem Prescriptions: TOVIAZ 4 MG XR24H-TAB (FESOTERODINE FUMARATE) 1 by mouth once daily  #30 x 3   Entered and Authorized by:   Newt Lukes MD   Signed by:   Newt Lukes MD on 05/26/2010   Method used:   Electronically to        [facility] [phone]* (retail)       [address]       [address]       [place], [place]  [address]       Ph: [phone]       Fax: [phone]   RxID:   [id number]    Orders Added: 1)  Est. Patient Level IV [64403] 2)  TLB-Udip w/ Micro [81001-URINE] 3)  T-Culture, Urine [[id number]] 4)  Prescription Created Electronically [id number]

## 2010-09-07 NOTE — Assessment & Plan Note (Signed)
Summary: migraines/cd   Vital Signs:  Patient profile:   45 year old female Height:      67 inches (170.18 cm) Weight:      215.12 pounds (97.78 kg) O2 Sat:      98 % on Room air Temp:     98.9 degrees F (37.17 degrees C) oral Pulse rate:   97 / minute BP sitting:   110 / 72  (left arm) Cuff size:   large  Vitals Entered By: Orlan Leavens (February 15, 2010 1:40 PM)  O2 Flow:  Room air CC: migraines, Headaches Is Patient Diabetic? No Pain Assessment Patient in pain? no        Primary Care Provider:  Newt Lukes MD  CC:  migraines and Headaches.  History of Present Illness:  Headaches      This is a 45 year old woman who presents with Headaches.  The symptoms began 1 week ago.  On a scale of 1 to 10, the intensity is described as a 5.  +prior hx migraines but none in recent years - prev relieved with midrin rx.  The patient reports photophobia, but denies nausea, vomiting, nasal congestion, sinus pain, and sinus pressure.  The headache is described as intermittent and sharp.  The location of the pain is bitemporal.  The patient denies the following high-risk features: fever, neck pain/stiffness, vision loss or change, focal weakness, altered mental status, rash, trauma, pain worse with exertion, new type of headache, and age >50 years.  The headaches are precipitated by change in weather.  Prior treatment has included no medication and a narcotic.    Current Medications (verified): 1)  Symbicort 80-4.5 Mcg/act Aero (Budesonide-Formoterol Fumarate) .... Two Puffs Two Times A Day 2)  Proventil Hfa 108 (90 Base) Mcg/act  Aers (Albuterol Sulfate) .... 2 Puffs Qid As Needed 3)  Methotrexate 2.5 Mg  Tabs (Methotrexate Sodium) .... Take 8 Pills Every Saturday. 4)  Folic Acid 1 Mg  Tabs (Folic Acid) .... Take One Tablet By Mouth Once Daily 5)  Atenolol 25 Mg  Tabs (Atenolol) .... Take 1/2 Tab By Mouth Once Daily As Needed 6)  Tramadol Hcl 50 Mg Tabs (Tramadol Hcl) .[name].. 1 By Mouth 3-4  Times Daily As Needed 7)  Cyclobenzaprine Hcl 10 Mg  Tabs (Cyclobenzaprine Hcl) .... 1/2 Tab At Bedtime As Needed 8)  Midrin 325-65-100 Mg  Caps (Apap-Isometheptene-Dichloral) .... Tale One Capsule By Mouth At Onset of Headache; Repeat in 2 Hours. Max Is 8 Capsules Per Day. 9)  Meclizine Hcl 25 Mg  Chew (Meclizine Hcl) .... Take One Tablet By Mouth Every 6 Hours As Needed. 10)  Mucinex 600 Mg Xr12h-Tab (Guaifenesin) .... One By Mouth Two Times A Day As Needed Cough 11)  Prednisone 5 Mg Tabs (Prednisone) .[name].. 10 Mg Every Other Day Alternating With 5 Mg Every Other Day  Allergies (verified): 1)  ! Sulfa  Past History:  Past Medical History: Bronchiectasis      - PFT 03/03/08 FEV1 3.18 (103%), FVC 3.83 (97%), FEV1% 83, TLC 4.85 (83%), DLCO 59%, no BD      - PFT 12/21/08 FEV1 3.22 (109%), FVC 3.71 (97%), FEV1% 87, TLC 4.68 (83%), DLCO 66%, no BD Polymyositis      - Dr. [name] MVP Fibromyalgia Abnormal liver function tests Menorrhagia  MD roster - rheum - Truslow gyn-[facility]kins pulmonary - Sood  Review of Systems  The patient denies vision loss, chest pain, and syncope.    Physical Exam  General:  overweight-appearing.  alert, well-developed, well-nourished, and cooperative to examination.    Lungs:  normal respiratory effort, no intercostal retractions or use of accessory muscles; normal breath sounds bilaterally - no crackles and no wheezes.    Heart:  normal rate, regular rhythm, no murmur, and no rub. BLE without edema.  Neurologic:  alert & oriented X3 and cranial nerves II-XII symetrically intact.  strength normal in all extremities, sensation intact to light touch, and gait normal. speech fluent without dysarthria or aphasia; follows commands with good comprehension.    Impression & Recommendations:  Problem # 1:  MIGRAINE HEADACHE (ICD-346.90)  hx same - reports prior neuro eval including MRI neg - neuro exam benign good relief in past with midrin - not avaiable  at this time will try imitrex with NSAID at the start of each episode - erx done if worse or not improved, refer to headache center for further eval and yx - pt agrees Her updated medication list for this problem includes:    Atenolol 25 Mg Tabs (Atenolol) .[name]... Take 1/2 tab by mouth once daily as needed    Tramadol Hcl 50 Mg Tabs (Tramadol hcl) .[name]... 1 by mouth 3-4 times daily as needed    Midrin 325-65-100 Mg Caps (Apap-isometheptene-dichloral) .[name]... Tale one capsule by mouth at onset of headache; repeat in 2 hours. max is 8 capsules per day.    Imitrex 50 Mg Tabs (Sumatriptan succinate) .[name]... 1 by mouth at onset of migraine, may repeat in 2hour if needed - max 200mg /24h  Headache diary reviewed.  Orders: Prescription Created Electronically [phone])  Complete Medication List: 1)  Symbicort 80-4.5 Mcg/act Aero (Budesonide-formoterol fumarate) .... Two puffs two times a day 2)  Proventil Hfa 108 (90 Base) Mcg/act Aers (Albuterol sulfate) .... 2 puffs qid as needed 3)  Methotrexate 2.5 Mg Tabs (Methotrexate sodium) .... Take 8 pills every saturday. 4)  Folic Acid 1 Mg Tabs (Folic acid) .... Take one tablet by mouth once daily 5)  Atenolol 25 Mg Tabs (Atenolol) .... Take 1/2 tab by mouth once daily as needed 6)  Tramadol Hcl 50 Mg Tabs (Tramadol hcl) .[name].. 1 by mouth 3-4 times daily as needed 7)  Cyclobenzaprine Hcl 10 Mg Tabs (Cyclobenzaprine hcl) .... 1/2 tab at bedtime as needed 8)  Midrin 325-65-100 Mg Caps (Apap-isometheptene-dichloral) .... Tale one capsule by mouth at onset of headache; repeat in 2 hours. max is 8 capsules per day. 9)  Meclizine Hcl 25 Mg Chew (Meclizine hcl) .... Take one tablet by mouth every 6 hours as needed. 10)  Mucinex 600 Mg Xr12h-tab (Guaifenesin) .... One by mouth two times a day as needed cough 11)  Prednisone 5 Mg Tabs (Prednisone) .[name].. 10 mg every other day alternating with 5 mg every other day 12)  Imitrex 50 Mg Tabs (Sumatriptan succinate) .[name].. 1 by mouth  at onset of migraine, may repeat in 2hour if needed - max 200mg /24h  Patient Instructions: 1)  it was good to see you today. 2)  try imitrex for your migriane symptoms as discussed - take with ibuprofen or naproxene for antinflammatiry relief as needed  3)  your prescriptions have been electronically submitted to your pharmacy. Please take as directed. Contact our office if you believe you're having problems with the medication(s).  4)  if continued or worsening symptoms, call and we can make referral to neurology or try different medications as needed  Prescriptions: IMITREX 50 MG TABS (SUMATRIPTAN SUCCINATE) 1 by mouth at onset of  migraine, may repeat in 2hour if needed - max 200mg /24h  #12 x 1   Entered and Authorized by:   Newt Lukes MD   Signed by:   Newt Lukes MD on 02/15/2010   Method used:   Electronically to        [facility] [phone]* (retail)       [address]       [address]       [place], [place]  [address]       Ph: [phone]       Fax: [phone]   RxID:   [id number]

## 2010-09-07 NOTE — Progress Notes (Signed)
Summary: Call Report  Phone Note Other Incoming   Caller: [facility] Summary of Call: [facility] Triage Call Report Triage Record Num: [id number] Operator: Coralee North Royal Patient Name: Jill Harper Call Date & Time: 03/30/2010 6:57:54PM Patient Phone: [phone] PCP: Rene Paci Patient Gender: Female PCP Fax : [phone] Patient DOB: [birth year] Practice Name: [facility] Reason for Call: Pt calling about thinking she was dehydrated and has been drinking but is feeling worse. Onset: 8/25, afebrile. When she stands to walk she starts having so much muscle aches that she sits back down. Advised to f/u with office for an appt within 72 hrs. Protocol(s) Used: Muscle Pain Recommended Outcome per Protocol: See Provider within 72 Hours Reason for Outcome: Impairs ability to perform activities of daily living (ADLs) Care Advice:  ~ Maintain good posture. Maintain physical activity through regular weight bearing mild exercise such as walking 20-30 minutes, 3 to 4 times per week if approved by provider.  ~  ~ SYMPTOM / CONDITION MANAGEMENT 03/30/2010 7:10:18PM Page 1 of 1 [email or web address] Initial call taken by: Margaret Pyle, CMA,  March 31, 2010 8:10 AM  Follow-up for Phone Call        noted - Follow-up by: Newt Lukes MD,  March 31, 2010 9:21 AM

## 2010-10-03 ENCOUNTER — Encounter: Payer: Self-pay | Admitting: Pulmonary Disease

## 2010-10-03 ENCOUNTER — Ambulatory Visit (INDEPENDENT_AMBULATORY_CARE_PROVIDER_SITE_OTHER): Payer: Federal, State, Local not specified - PPO | Admitting: Pulmonary Disease

## 2010-10-03 DIAGNOSIS — J479 Bronchiectasis, uncomplicated: Secondary | ICD-10-CM

## 2010-10-03 DIAGNOSIS — M3320 Polymyositis, organ involvement unspecified: Secondary | ICD-10-CM

## 2010-10-12 NOTE — Assessment & Plan Note (Signed)
Summary: rov/jd   Copy to:  [name] Primary Provider/Referring Provider:  Newt Lukes MD  CC:  follow up. Pt states her breathing has been doing "pretty good". Pt c/o chest tightness x couple days and cough w/ dark yellow phlem. [name]  History of Present Illness: 45 yo female with bronchiectasis, polymyositis, and dyspnea.  She has occasional cough with yellow sputum.  She has noticed the sputum is a little more dark in color.  She denies wheeze or chest pain.  She has not had fever.  Her sinuses and throat have been okay.  She is not using proventil much.    Current Medications (verified): 1)  Proventil Hfa 108 (90 Base) Mcg/act  Aers (Albuterol Sulfate) .... 2 Puffs Qid As Needed 2)  Methotrexate 2.5 Mg  Tabs (Methotrexate Sodium) .... Take 8 Pills Every Saturday. 3)  Folic Acid 1 Mg  Tabs (Folic Acid) .... Take One Tablet By Mouth Once Daily 4)  Atenolol 25 Mg  Tabs (Atenolol) .... Take 1/2 Tab By Mouth Once Daily As Needed 5)  Tramadol Hcl 50 Mg Tabs (Tramadol Hcl) .[name].. 1 By Mouth 3-4 Times Daily As Needed 6)  Cyclobenzaprine Hcl 10 Mg  Tabs (Cyclobenzaprine Hcl) .... 1/2 Tab At Bedtime As Needed 7)  Midrin 325-65-100 Mg  Caps (Apap-Isometheptene-Dichloral) .... Tale One Capsule By Mouth At Onset of Headache; Repeat in 2 Hours. Max Is 8 Capsules Per Day. 8)  Meclizine Hcl 25 Mg  Chew (Meclizine Hcl) .... Take One Tablet By Mouth Every 6 Hours As Needed. 9)  Mucinex 600 Mg Xr12h-Tab (Guaifenesin) .... One By Mouth Two Times A Day As Needed Cough 10)  Imitrex 50 Mg Tabs (Sumatriptan Succinate) .[name].. 1 By Mouth At Onset of Migraine, May Repeat in 2hour If Needed - Max 200mg /24h 11)  Qvar 80 Mcg/act Aers (Beclomethasone Dipropionate) .... Two Puffs Two Times A Day For 2 Weeks, and If Okay Then 2 Puffs At Bedtime  Allergies (verified): 1)  ! Sulfa  Past History:  Past Medical History: Reviewed history from 05/26/2010 and no changes required. Bronchiectasis      - PFT  03/03/08 FEV1 3.18 (103%), FVC 3.83 (97%), FEV1% 83, TLC 4.85 (83%), DLCO 59%, no BD      - PFT 12/21/08 FEV1 3.22 (109%), FVC 3.71 (97%), FEV1% 87, TLC 4.68 (83%), DLCO 66%, no BD Polymyositis      - Dr. [name] MVP Fibromyalgia Abnormal liver function tests Menorrhagia OAB    MD roster - rheum - Tr[facility]low gyn-[facility] pulmonary - Rhia Blatchford  Past Surgical History: Reviewed history from 05/25/2009 and no changes required. Normal cardiac cath 2002 D & C       - 08/31/08 Dr. Zelphia Cairo  Vital Signs:  Patient profile:   45 year old female Height:      67 inches Weight:      205.25 pounds BMI:     32.26 O2 Sat:      99 % on Room air Temp:     98.2 degrees F oral Pulse rate:   82 / minute BP sitting:   108 / 78  (left arm) Cuff size:   large  Vitals Entered By: Carver Fila (October 03, 2010 12:04 PM)  O2 Flow:  Room air CC: follow up. Pt states her breathing has been doing "pretty good". Pt c/o chest tightness x couple days, cough w/ dark yellow phlem.  Comments meds and allergies updated Phone number updated Carver Fila  October 03, 2010 12:06  PM    Physical Exam  General:  normal appearance and obese.   Nose:  no sinus tenderness, prominent turbinates Mouth:  MP 3, 2+ tonsills Neck:  no JVD.   Lungs:  decreased breath sounds, no raales, no wheezing Heart:  regular rhythm, normal rate, and no murmurs.   Extremities:  no edema Neurologic:  normal CN II-XII.   Cervical Nodes:  no significant adenopathy   Impression & Recommendations:  Problem # 1:  BRONCHIECTASIS (ICD-494.0) Advised her to use proventil more and continue Qvar.  She will call to update her status, and then decide if she needs to restart Symbicort.  Problem # 2:  POLYMYOSITIS (ICD-710.4)  She is maintained on MTX er rheumatology.  Complete Medication List: 1)  Proventil Hfa 108 (90 Base) Mcg/act Aers (Albuterol sulfate) .... 2 puffs qid as needed 2)  Methotrexate 2.5 Mg Tabs  (Methotrexate sodium) .... Take 8 pills every saturday. 3)  Folic Acid 1 Mg Tabs (Folic acid) .... Take one tablet by mouth once daily 4)  Atenolol 25 Mg Tabs (Atenolol) .... Take 1/2 tab by mouth once daily as needed 5)  Tramadol Hcl 50 Mg Tabs (Tramadol hcl) .[name].. 1 by mouth 3-4 times daily as needed 6)  Cyclobenzaprine Hcl 10 Mg Tabs (Cyclobenzaprine hcl) .... 1/2 tab at bedtime as needed 7)  Midrin 325-65-100 Mg Caps (Apap-isometheptene-dichloral) .... Tale one capsule by mouth at onset of headache; repeat in 2 hours. max is 8 capsules per day. 8)  Meclizine Hcl 25 Mg Chew (Meclizine hcl) .... Take one tablet by mouth every 6 hours as needed. 9)  Mucinex 600 Mg Xr12h-tab (Guaifenesin) .... One by mouth two times a day as needed cough 10)  Imitrex 50 Mg Tabs (Sumatriptan succinate) .[name].. 1 by mouth at onset of migraine, may repeat in 2hour if needed - max 200mg /24h 11)  Qvar 80 Mcg/act Aers (Beclomethasone dipropionate) .... Two puffs two times a day for 2 weeks, and if okay then 2 puffs at bedtime  Other Orders: Est. Patient Level III (16109)  Patient Instructions: 1)  Try using proventil more as needed 2)  Follow up in 6 months

## 2010-11-20 LAB — CBC
HCT: 31.3 % — ABNORMAL LOW (ref 36.0–46.0)
MCHC: 33 g/dL (ref 30.0–36.0)
Platelets: 257 10*3/uL (ref 150–400)
RBC: 3.73 MIL/uL — ABNORMAL LOW (ref 3.87–5.11)

## 2010-12-19 NOTE — Op Note (Signed)
NAMEMARIBETH, Jill Harper                  ACCOUNT NO.:  [id number]   MEDICAL RECORD NO.:  [id number]          PATIENT TYPE:  AMB   LOCATION:  SDC                           FACILITY:  [facility]   PHYSICIAN:  Zelphia Cairo, MD    DATE OF BIRTH:  1966-02-16   DATE OF PROCEDURE:  08/31/2008  DATE OF DISCHARGE:                               OPERATIVE REPORT   PREOPERATIVE DIAGNOSIS:  Menorrhagia.   POSTOPERATIVE DIAGNOSIS:  Menorrhagia, path pending.   PROCEDURES:  1. Cervical block.  2. Hysteroscopy.  3. Dilation and curettage.  4. NovaSure ablation.   SURGEON:  Zelphia Cairo, MD   ASSISTANT:  None.   ANESTHESIA:  Local with general.   SPECIMEN:  Endometrial curettings to Pathology.   ESTIMATED BLOOD LOSS:  Minimal.   COMPLICATIONS:  None.   CONDITION:  Stable to recovery room.   PROCEDURE:  The patient was taken to the operating room where anesthesia  was found to be adequate.  She was placed in the dorsal lithotomy  position using Allen stirrups.  She was prepped and draped in sterile  fashion and a catheter was used to drain her bladder sterilely.  Bivalve  speculum was placed in the vagina and a single-tooth tenaculum was  placed on the anterior lip of the cervix.  Cervical block was provided  using 1% Nesacaine.  The uterine cavity sounded to 8 cm.  The cervical  length was 4 cm.  Hysteroscopy was inserted under direct visualization  and a survey of the endometrial cavity was performed.  Bilateral ostia  were visualized and appeared normal.  Uterine cavity appeared normal.  There was fluffy thickened tissue noted on the anterior lower uterine  segment of the uterus.  Hysteroscope was removed and a gentle curetting  was performed.  Specimen was placed on Telfa and passed off to be sent  to Pathology.   The NovaSure device was then inserted into the endometrial cavity and  NovaSure ablation was performed using standard manufacture guidelines.  Once the cycle time was  complete, the NovaSure device was removed.  Single-tooth tenaculum was removed.  Cervix was found to be hemostatic,  and the patient was taken to the recovery room in stable condition.  Sponge, lap, needle, and instrument counts were correct x2.      Zelphia Cairo, MD  Electronically Signed     GA/MEDQ  D:  08/31/2008  T:  08/31/2008  Job:  [id number]

## 2010-12-22 NOTE — Discharge Summary (Signed)
[place]  Patient:    Jill Harper, Jill Harper Visit Number: [id number] MRN: [id number]          Service Type: MED Location: 2000 2010 01 Attending Physician:  Veneda Melter Dictated by:   Tereso Newcomer, P.A. Admit Date:  05/05/2001 Discharge Date: 05/06/2001   CC:         Duffy Rhody C. Andrey Campanile, M.D.   Discharge Summary  DATE OF BIRTH:  1965/12/05  DISCHARGE DIAGNOSES: 1. Chest pain, etiology unclear. 2. Chest CT this admission revealing shotty mediastinal and hilar lymph nodes    and definite enlarged lymph nodes by CT criteria within the chest.    Scattered small peripheral nodular opacities within both lungs dependently.    These are nonspecific and could represent inflammatory process.  Recommend    followup in three months. 3. History of mitral valve prolapse by report. 4. Migraine headaches. 5. Family history of premature coronary artery disease. 6. Depression.  HOSPITAL COURSE:  This 45 year old female was sent to the emergency room by her primary care physician, Dr. Duffy Rhody C. Wilson, on May 05, 2001 for the evaluation of chest pain.  Patient has a long history of chest pain associated with her mitral valve prolapse.  Over the three to four days prior to admission, she had noticed substernal chest pain that was similar to what she had experienced in the past with her mitral valve prolapse.  She had substernal tightness that was worse with exertion and better with rest. Instead of taking her atenolol 25 mg one-half tablet, she took one whole tablet q.d. for the past three to four days prior to admission.  She thought this would help.  Her pain worsened.  She reported being unable to do minimal exertion without getting chest pain (for example, walking down the hallway in her home).  She also noted associated shortness of breath and radiation down bilateral upper extremities and into her neck and diaphoresis but no nausea. She had also  noticed decreased exercise tolerance of late.  She was seen in her primary care physicians office on the date of admission and then was sent to the emergency room for further evaluation.  She denied any recent prolonged trips.  She denied any pleuritic chest pain.  She did note that her chest pain got worse when she had her arms extended above her head for a prolonged period of time.  She denied any syncope but did feel dizzy over the past few days. On initial exam, her blood pressure was 112/69, pulse 48 to 55.  Neck without JVD.  Cardiac:  S1 click, S2, regular rate and rhythm with no murmurs.  Lungs clear to auscultation.  Extremities without edema.  Abdomen was soft with mild epigastric tenderness, positive bowel sounds.  Chest wall with positive pain to palpation in the substernal and pectoral regions, especially on the right. EKG showed sinus bradycardia with a heart rate of 54 and nonspecific ST-T wave changes.  Chest x-ray showed no acute disease.  She was admitted and ruled out for MI by enzymes.  Her CK-MB and troponin Is were both negative x 3. D-dimer was elevated at 1.13.  She was sent for a chest CT to rule out pulmonary embolism.  This showed no evidence of pulmonary embolus or DVT. There was also some bibasilar atelectasis/scarring noted.  Also, there were shotty mediastinal and hilar lymph nodes and definite enlarged lymph nodes by CT criteria within the chest as noted in the  discharge diagnoses above. Patient was seen on May 06, 2001.  She was still complaining of chest pain while moving around in her room.  She was seen by Dr. Corinda Gubler on this date. It was decided to place the patient on ibuprofen 600 mg q.8h. x 5 to 7 days for possible chest wall pain.  She would need a followup chest CT in three months.  ANA and erythrocyte sedimentation rate were also ordered and are pending at the time of this dictation.  It was felt that she could be discharged to home with followup  stress-only Cardiolite in the office on Friday, October 4th, at 1:15 p.m.  She would need close followup with her primary care physician, Dr. Andrey Campanile, as well.  LABORATORY DATA:  WBC 7600, hemoglobin 13.2, hematocrit 39, platelet count 257,000, MCV 80.3, normal differential.  INR 1.1.  D-dimer as noted above 1.13.  Sodium 140, potassium 3.7, chloride 108, CO2 27, glucose 91, BUN 6, creatinine 0.8, total bilirubin 0.9, alkaline phosphatase 50, AST 20, ALT 19, total protein 8.0, albumin 3.9, calcium 8.9.  Cardiac enzymes as noted above. TSH 4.729.  Pending at the time of this dictation:  Lipid profile, ANA and sed rate.  DISCHARGE MEDICATIONS: 1. Atenolol 25 mg one-half daily. 2. Paxil 20 mg q.d. 3. Coated aspirin 325 mg q.d. 4. Ibuprofen 600 mg q.8h. x 5 to 7 days.  ACTIVITY:  Activity is as tolerated.  DIET:  Low fat, low sodium.  FOLLOWUP:  The patient is to follow up with Dr. Andrey Campanile in 7 to 10 days and she should call for an appointment; she will also follow up in our office for a stress-only Cardiolite on Friday, October 4th, at 1:15 p.m.  FOLLOWUP CONSIDERATIONS: 1. The patient needs a followup chest CT in three months to be compared to the    scan that was done on May 05, 2001.  Please see the above discharge    diagnoses for the complete report from that chest CT. 2. The patient has an erythrocyte sedimentation rate and ANA test pending at    the time of this dictation.  She will need to follow up with her primary    care physician for these issues. 3. As noted above, she has a stress test on Friday, October 4th, to rule out    ischemic heart disease. Dictated by:   Tereso Newcomer, P.A. Attending Physician:  Veneda Melter DD:  05/06/01 TD:  05/06/01 Job: [id number]

## 2010-12-22 NOTE — Cardiovascular Report (Signed)
[place]  Patient:    Jill Harper, Jill Harper Visit Number: [id number] MRN: [id number]          Service Type: CAT Location: [facility] 2899 13 Attending Physician:  Veneda Melter Dictated by:   Daisey Must, M.D. [facility] Proc. Date: 05/30/01 Admit Date:  05/30/2001 Discharge Date: 05/30/2001   CC:         Duffy Rhody C. Andrey Campanile, M.D.  Veneda Melter, M.D.  Cardiac Catheterization Laboratory   Cardiac Catheterization  PROCEDURES PERFORMED: Left heart catheterization with coronary angiography and left ventriculography.  INDICATIONS: The patient is a 45 year old woman, with a history of mitral valve prolapse and chest pain. She has had recurrent episodes of substernal chest pain. A stress Cardiolite showed no evidence of ischemia. However, because of her recurrent chest pain, it was felt best to proceed with cardiac catheterization to rule out coronary artery disease.  DESCRIPTION OF PROCEDURE: A 6 French sheath was placed in the right femoral artery. Standard Judkins 6 French catheters were utilized. Contrast was Omnipaque. There were no complications.  RESULTS:  HEMODYNAMICS: Left ventricular pressure 122/12, aortic pressure 116/72. There was no aortic valve gradient.  LEFT VENTRICULOGRAM: Wall motion is normal. Ejection fraction calculated at 58%. There is no mitral regurgitation.  CORONARY ARTERIOGRAPHY: (Left dominant).  Left main: Normal.  Left anterior descending: The left anterior descending artery is a large vessel which wraps around the apex and supplies the distal portion of the inferior septal wall. The LAD is free of angiographic disease.  Left circumflex: The left circumflex is a dominant vessel. It gives rise to a normal sized branching ramus intermedius, small marginal branch, small posterolateral branch and a small posterior descending artery. The left circumflex is free of angiographic disease.  Right coronary artery: The right  coronary artery is a small nondominant vessel. It is free of angiographic disease.  IMPRESSIONS: 1. Normal left ventricular systolic function. 2. Normal coronary arteries by angiography. Dictated by:   Daisey Must, M.D. [facility] Attending Physician:  Veneda Melter DD:  05/30/01 TD:  06/02/01 Job: [id number]

## 2011-01-25 ENCOUNTER — Encounter: Payer: Self-pay | Admitting: Adult Health

## 2011-01-25 ENCOUNTER — Ambulatory Visit (INDEPENDENT_AMBULATORY_CARE_PROVIDER_SITE_OTHER): Payer: Federal, State, Local not specified - PPO | Admitting: Adult Health

## 2011-01-25 DIAGNOSIS — J479 Bronchiectasis, uncomplicated: Secondary | ICD-10-CM

## 2011-01-25 MED ORDER — AMOXICILLIN-POT CLAVULANATE 875-125 MG PO TABS: 1.0000 | ORAL_TABLET | Freq: Two times a day (BID) | ORAL | Status: AC

## 2011-01-25 NOTE — Progress Notes (Signed)
  Subjective:    Patient ID: Jill Harper, female    DOB: [birth year], 45 y.o.   MRN: [id number]  HPI 45 yo female with bronchiectasis, polymyositis, and dyspnea.  01/25/11 Acute OV  Pt complains of increased cough and congestion x couple of weeks.   Mucus colored changed to dark yellow and Mccravy x 3 days ago.  Slight increased SOB with exertion.  Mild wheezing at times. Mucus is very thick at times. OTC not helping.  She is on Methotrexate weekly by Rheumatology.   Review of Systems Constitutional:   No  weight loss, night sweats,  Fevers, chills, fatigue, or  lassitude.  HEENT:   No headaches,  Difficulty swallowing,  Tooth/dental problems, or  Sore throat,                No sneezing, itching, ear ache, nasal congestion, post nasal drip,   CV:  No chest pain,  Orthopnea, PND, swelling in lower extremities, anasarca, dizziness, palpitations, syncope.   GI  No heartburn, indigestion, abdominal pain, nausea, vomiting, diarrhea, change in bowel habits, loss of appetite, bloody stools.   Resp:  No coughing up of blood.     No chest wall deformity  Skin: no rash or lesions.  GU: no dysuria, change in color of urine, no urgency or frequency.  No flank pain, no hematuria      Psych:  No change in mood or affect. No depression or anxiety.  No memory loss.         Objective:   Physical Exam GEN: A/Ox3; pleasant , NAD, well nourished   HEENT:  [place]/AT,  EACs-clear, TMs-wnl, NOSE-clear, THROAT-clear, no lesions, no postnasal drip or exudate noted.   NECK:  Supple w/ fair ROM; no JVD; normal carotid impulses w/o bruits; no thyromegaly or nodules palpated; no lymphadenopathy.  RESP  Coarse BS w/ no wheezing  no accessory muscle use, no dullness to percussion  CARD:  RRR, no m/r/g  , no peripheral edema, pulses intact, no cyanosis or clubbing.  GI:   Soft & nt; nml bowel sounds; no organomegaly or masses detected.  Musco: Warm bil, no deformities or joint swelling noted.   Neuro:  alert, no focal deficits noted.    Skin: Warm, no lesions or rashes         Assessment & Plan:

## 2011-01-25 NOTE — Patient Instructions (Signed)
Augmentin 875mg  Twice daily  For 10 days  Mucinex DM Twice daily  As needed   Fluids and rest  Please contact office for sooner follow up if symptoms do not improve or worsen or seek emergency care  follow up Dr. Craige Cotta as planned and As needed

## 2011-01-25 NOTE — Assessment & Plan Note (Addendum)
Flare   Plan  Augmentin 875mg  Twice daily  For 10 days  Mucinex DM Twice daily  As needed   Fluids and rest  Please contact office for sooner follow up if symptoms do not improve or worsen or seek emergency care  follow up Dr. Craige Cotta as planned and As needed

## 2011-01-26 NOTE — Progress Notes (Signed)
Reviewed, and agree with plan.

## 2011-01-30 ENCOUNTER — Encounter: Payer: Self-pay | Admitting: Internal Medicine

## 2011-03-13 ENCOUNTER — Ambulatory Visit (INDEPENDENT_AMBULATORY_CARE_PROVIDER_SITE_OTHER): Payer: Federal, State, Local not specified - PPO | Admitting: Pulmonary Disease

## 2011-03-13 ENCOUNTER — Encounter: Payer: Self-pay | Admitting: Pulmonary Disease

## 2011-03-13 DIAGNOSIS — J479 Bronchiectasis, uncomplicated: Secondary | ICD-10-CM

## 2011-03-13 DIAGNOSIS — M3320 Polymyositis, organ involvement unspecified: Secondary | ICD-10-CM

## 2011-03-13 NOTE — Patient Instructions (Signed)
Follow up in 6 months 

## 2011-03-13 NOTE — Assessment & Plan Note (Signed)
She is to continue MTX per Dr. Truslow. 

## 2011-03-13 NOTE — Assessment & Plan Note (Signed)
She has recovered from episode of bronchitis in June.  She is to continue her current inhaler regimen.

## 2011-03-13 NOTE — Progress Notes (Signed)
  Subjective:    Patient ID: CHYENNE SOBCZAK, female    DOB: 1965/11/29, 45 y.o.   MRN: [id number]  HPI CC: [name]  45 yo female with bronchiectasis in the setting of polymyositis.  She was treated for a bronchitis in June.  She has improved.  She gets occasional rattle in her chest, and this improves with proventil.  She is not needing to use proventil much.  She is not having much sputum.  She denies hemoptysis, fever, or chest pain.  She was not able to tolerate lowering of her MTX dose.  She had lab work last week with Dr. Linus Galas.  Review of Systems     Objective:   Physical Exam  Blood pressure 124/72, pulse 63, temperature 97.2 F (36.2 C), temperature source Oral, height 5' 7.5" (1.715 m), weight 208 lb 3.2 oz (94.439 kg), SpO2 100.00%.  HEENT - no sinus tenderness, no oral exudate, no LAN Cardiac - s1s2 regular, no murmur Chest - no wheeze/rales Abd - soft, non-tender Ext - no edema Neuro - normal strength, CN intact Psych - normal mood, behavior      Assessment & Plan:

## 2011-04-24 ENCOUNTER — Other Ambulatory Visit: Payer: Self-pay | Admitting: *Deleted

## 2011-04-24 MED ORDER — ATENOLOL 25 MG PO TABS: 12.5000 mg | ORAL_TABLET | Freq: Every day | ORAL | Status: DC | PRN

## 2011-04-24 NOTE — Telephone Encounter (Signed)
Pt left msg on vm Monday needing new rx for her Atenolol. Old rx has expired. Tried to call pt back this am was out of office on yesterday. Left msg sending 30 day to [facility]/[place] she is overdue for yearly physical..Marland KitchenMarland Kitchen9/18/12@10 :53am/LMB

## 2011-05-04 ENCOUNTER — Encounter: Payer: Self-pay | Admitting: Adult Health

## 2011-05-04 ENCOUNTER — Ambulatory Visit (INDEPENDENT_AMBULATORY_CARE_PROVIDER_SITE_OTHER)
Admission: RE | Admit: 2011-05-04 | Discharge: 2011-05-04 | Disposition: A | Discharge status: Home / Self Care | Payer: Federal, State, Local not specified - PPO | Source: Ambulatory Visit | Attending: Adult Health | Admitting: Adult Health

## 2011-05-04 ENCOUNTER — Ambulatory Visit (INDEPENDENT_AMBULATORY_CARE_PROVIDER_SITE_OTHER): Payer: Federal, State, Local not specified - PPO | Admitting: Adult Health

## 2011-05-04 ENCOUNTER — Encounter: Payer: Self-pay | Admitting: *Deleted

## 2011-05-04 DIAGNOSIS — J479 Bronchiectasis, uncomplicated: Secondary | ICD-10-CM

## 2011-05-04 LAB — POCT I-STAT, CHEM 8
BUN: 10
Calcium, Ion: 1.17
Chloride: 107
Creatinine, Ser: 0.8
Glucose, Bld: 87
HCT: 37
Hemoglobin: 12.6
Potassium: 4
Sodium: 141
TCO2: 25

## 2011-05-04 LAB — DIFFERENTIAL
Basophils Relative: 1
Eosinophils Absolute: 0
Eosinophils Relative: 0
Lymphs Abs: 1.9
Monocytes Relative: 8

## 2011-05-04 LAB — CBC
HCT: 37.1
MCHC: 32.8
MCV: 87.7
Platelets: 254
RBC: 4.23
WBC: 9.5

## 2011-05-04 MED ORDER — CEFDINIR 300 MG PO CAPS: 300.0000 mg | ORAL_CAPSULE | Freq: Two times a day (BID) | ORAL | Status: AC

## 2011-05-04 NOTE — Progress Notes (Signed)
Reviewed and agree with plan.

## 2011-05-04 NOTE — Patient Instructions (Signed)
Omnicef 300mg   Twice daily  For 10 days  Mucinex DM Twice daily  As needed   Fluids and rest  Please contact office for sooner follow up if symptoms do not improve or worsen or seek emergency care  follow up Dr. Craige Cotta as planned and As needed

## 2011-05-04 NOTE — Progress Notes (Signed)
  Subjective:    Patient ID: Jill Harper, female    DOB: 04/11/1966, 45 y.o.   MRN: [id number]  HPI  CC: [name]  45 yo female with bronchiectasis in the setting of polymyositis.  03/13/11  Follow up OV  She was treated for a bronchitis in June.  She has improved.  She gets occasional rattle in her chest, and this improves with proventil.  She is not needing to use proventil much.  She is not having much sputum.  She denies hemoptysis, fever, or chest pain.  She was not able to tolerate lowering of her MTX dose.  She had lab work last week with Dr. Linus Galas. >>no changes   05/04/2011 Acute OV  Complains of  cough w/ [place] phlem, chest congestion, chest tightness for  4 days .  Pt also states she feels extremely tired. OTC not helping. Did have trace of blood specs in mucus initially.  No chest pain or edema. Appetite is good. No recent travel or abx  Use .   Review of Systems Constitutional:   No  weight loss, night sweats,  Fevers, chills,  +fatigue, or  lassitude.  HEENT:   No headaches,  Difficulty swallowing,  Tooth/dental problems, or  Sore throat,                + sneezing nasal congestion, post nasal drip,   CV:  No chest pain,  Orthopnea, PND, swelling in lower extremities, anasarca, dizziness, palpitations, syncope.   GI  No heartburn, indigestion, abdominal pain, nausea, vomiting, diarrhea, change in bowel habits, loss of appetite, bloody stools.   Resp:   No coughing up of blood.     No chest wall deformity  Skin: no rash or lesions.  GU: no dysuria, change in color of urine, no urgency or frequency.  No flank pain, no hematuria   MS:  No joint pain or swelling.    Psych:  No change in mood or affect. No depression or anxiety.  No memory loss.          Objective:   Physical Exam GEN: A/Ox3; pleasant , NAD, well nourished   HEENT:  [place]/AT,  EACs-clear, TMs-wnl, NOSE-clear, THROAT-clear, no lesions, no postnasal drip or exudate noted.   NECK:  Supple w/  fair ROM; no JVD; normal carotid impulses w/o bruits; no thyromegaly or nodules palpated; no lymphadenopathy.  RESP  Coarse BS w/o wheezing no accessory muscle use, no dullness to percussion  CARD:  RRR, no m/r/g  , no peripheral edema, pulses intact, no cyanosis or clubbing.  GI:   Soft & nt; nml bowel sounds; no organomegaly or masses detected.  Musco: Warm bil, no deformities or joint swelling noted.   Neuro: alert, no focal deficits noted.    Skin: Warm, no lesions or rashes    Blood pressure 120/68, pulse 76, temperature 98.2 F (36.8 C), temperature source Oral, height 5' 7.5" (1.715 m), weight 214 lb (97.07 kg), SpO2 99.00%.        Assessment & Plan:

## 2011-05-04 NOTE — Assessment & Plan Note (Signed)
Flare  xray pending.   Plan;  Omnicef 300mg   Twice daily  For 10 days  Mucinex DM Twice daily  As needed   Fluids and rest  Please contact office for sooner follow up if symptoms do not improve or worsen or seek emergency care  follow up Dr. Craige Cotta as planned and As needed

## 2011-05-11 ENCOUNTER — Telehealth: Payer: Self-pay | Admitting: *Deleted

## 2011-05-11 DIAGNOSIS — Z0000 Encounter for general adult medical examination without abnormal findings: Secondary | ICD-10-CM

## 2011-05-11 NOTE — Telephone Encounter (Signed)
Received staff msg pt made appt for cpx in November. Needing labs entered in EPIC....05/11/11@2 :48pm/LMB

## 2011-06-14 ENCOUNTER — Other Ambulatory Visit (INDEPENDENT_AMBULATORY_CARE_PROVIDER_SITE_OTHER): Payer: Federal, State, Local not specified - PPO

## 2011-06-14 DIAGNOSIS — Z0000 Encounter for general adult medical examination without abnormal findings: Secondary | ICD-10-CM

## 2011-06-14 LAB — BASIC METABOLIC PANEL
CO2: 28 mEq/L (ref 19–32)
Chloride: 107 mEq/L (ref 96–112)
Creatinine, Ser: 0.6 mg/dL (ref 0.4–1.2)
Sodium: 140 mEq/L (ref 135–145)

## 2011-06-14 LAB — CBC WITH DIFFERENTIAL/PLATELET
Basophils Absolute: 0 10*3/uL (ref 0.0–0.1)
Hemoglobin: 12.9 g/dL (ref 12.0–15.0)
Lymphocytes Relative: 21 % (ref 12.0–46.0)
Monocytes Relative: 8 % (ref 3.0–12.0)
Neutro Abs: 5.6 10*3/uL (ref 1.4–7.7)
RBC: 4.22 Mil/uL (ref 3.87–5.11)
RDW: 13.4 % (ref 11.5–14.6)
WBC: 8.1 10*3/uL (ref 4.5–10.5)

## 2011-06-14 LAB — URINALYSIS, ROUTINE W REFLEX MICROSCOPIC
Ketones, ur: NEGATIVE
Leukocytes, UA: NEGATIVE
Specific Gravity, Urine: 1.015 (ref 1.000–1.030)
Total Protein, Urine: NEGATIVE
Urine Glucose: NEGATIVE
pH: 5.5 (ref 5.0–8.0)

## 2011-06-14 LAB — HEPATIC FUNCTION PANEL
AST: 21 U/L (ref 0–37)
Alkaline Phosphatase: 52 U/L (ref 39–117)
Bilirubin, Direct: 0.1 mg/dL (ref 0.0–0.3)
Total Bilirubin: 0.8 mg/dL (ref 0.3–1.2)

## 2011-06-14 LAB — LIPID PANEL: Total CHOL/HDL Ratio: 2

## 2011-06-18 ENCOUNTER — Ambulatory Visit (INDEPENDENT_AMBULATORY_CARE_PROVIDER_SITE_OTHER): Payer: Federal, State, Local not specified - PPO | Admitting: Internal Medicine

## 2011-06-18 ENCOUNTER — Encounter: Payer: Self-pay | Admitting: Internal Medicine

## 2011-06-18 VITALS — BP 110/72 | HR 92 | Temp 99.1°F | Resp 14 | Ht 68.0 in | Wt 215.5 lb

## 2011-06-18 DIAGNOSIS — Z0000 Encounter for general adult medical examination without abnormal findings: Secondary | ICD-10-CM

## 2011-06-18 DIAGNOSIS — Z136 Encounter for screening for cardiovascular disorders: Secondary | ICD-10-CM

## 2011-06-18 NOTE — Progress Notes (Signed)
Subjective:    Patient ID: Jill Harper, female    DOB: Feb 09, 1966, 45 y.o.   MRN: [id number]  HPI  patient is here today for annual physical. Patient feels well and has no complaints.  Past Medical History  Diagnosis Date  . Bronchiectasis     PFT 03-03-08 FEV1 3.18 (103%), FVC 3,83 (97%), FEV1% 83, TLC 4,85 (83%),DLCO 59%, no BD. PFT 12-21-08 FEV1 3.22 (109%), FVC 3.71 (97%), FEV1% 87, TLC 4.68 (83%), DLCO 66%, no BD  . Polymyositis     Dr [name]; chronic steroids  . MVP (mitral valve prolapse)   . Fibromyalgia   . Abnormal liver function test   . Menorrhagia   . OAB (overactive bladder)   . MIGRAINE HEADACHE    Family History  Problem Relation Age of Onset  . Diabetes Mother   . Fibromyalgia Mother   . Ulcers Mother    History  Substance Use Topics  . Smoking status: Never Smoker   . Smokeless tobacco: Never Used   Comment: Married, lives with spouse. 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 Alcohol Use: No    Review of Systems Constitutional: Negative for fever or weight change.  Respiratory: Negative for cough and shortness of breath.   Cardiovascular: Negative for chest pain or palpitations.  Gastrointestinal: Negative for abdominal pain, no bowel changes.  Musculoskeletal: Negative for gait problem or joint swelling.  Skin: Negative for rash.  Neurological: Negative for dizziness or headache.  No other specific complaints in a complete review of systems (except as listed in HPI above).     Objective:   Physical Exam BP 110/72  Pulse 92  Temp(Src) 99.1 F (37.3 C) (Oral)  Resp 14  Ht 5\' 8"  (1.727 m)  Wt 215 lb 8 oz (97.75 kg)  BMI 32.77 kg/m2  SpO2 99% Wt Readings from Last 3 Encounters:  06/18/11 215 lb 8 oz (97.75 kg)  05/04/11 214 lb (97.07 kg)  03/13/11 208 lb 3.2 oz (94.439 kg)   Constitutional: She is overweight; appears well-developed and well-nourished. No distress.  HENT: Head: Normocephalic and  atraumatic. Ears: B TMs ok, no erythema or effusion; Nose: Nose normal.  Mouth/Throat: Oropharynx is clear and moist. No oropharyngeal exudate.  Eyes: Conjunctivae and EOM are normal. Pupils are equal, round, and reactive to light. No scleral icterus.  Neck: Normal range of motion. Neck supple. No JVD present. No thyromegaly present.  Cardiovascular: Normal rate, regular rhythm and normal heart sounds.  No murmur heard. No BLE edema. Pulmonary/Chest: Effort normal and breath sounds normal. No respiratory distress. She has no wheezes.  Abdominal: Soft. Bowel sounds are normal. She exhibits no distension. There is no tenderness. no masses Musculoskeletal: Normal range of motion, no joint effusions. No gross deformities Neurological: She is alert and oriented to person, place, and time. No cranial nerve deficit. Coordination normal.  Skin: Skin is warm and dry. No rash noted. No erythema.  Psychiatric: She has a normal mood and affect. Her behavior is normal. Judgment and thought content normal.   Lab Results  Component Value Date   WBC 8.1 06/14/2011   HGB 12.9 06/14/2011   HCT 38.3 06/14/2011   PLT 200.0 06/14/2011   GLUCOSE 75 06/14/2011   CHOL 133 06/14/2011   TRIG 34.0 06/14/2011   HDL 65.80 06/14/2011   LDLCALC 60 06/14/2011   ALT 22 06/14/2011   AST 21 06/14/2011   NA 140 06/14/2011   K 3.7  06/14/2011   CL 107 06/14/2011   CREATININE 0.6 06/14/2011   BUN 14 06/14/2011   CO2 28 06/14/2011   TSH 2.26 06/14/2011    ECG: NSR 75bpm - short PR(.114) - new since 2010    Assessment & Plan:   CPX - v70.0 - Patient has been counseled on age-appropriate routine health concerns for screening and prevention. These are reviewed and up-to-date. Immunizations are up-to-date or declined. Labs and ECG reviewed.

## 2011-06-18 NOTE — Patient Instructions (Addendum)
It was good to see you today. Exam looks good and labs reviewed - keep up the good work! Health Maintenance reviewed - everything up to date.  Continue to follow with your other providers as ongoing Work on lifestyle changes as discussed (low fat, low carb, increased protein diet; improved exercise efforts; weight loss) to control sugar, blood pressure and cholesterol levels and/or reduce risk of developing other medical problems. Look into [email or web address] or other type of food journal to assist you in this process. Please schedule followup in 12 months for annual physical, call sooner if problems.

## 2011-08-02 ENCOUNTER — Ambulatory Visit (INDEPENDENT_AMBULATORY_CARE_PROVIDER_SITE_OTHER): Payer: Federal, State, Local not specified - PPO | Admitting: Internal Medicine

## 2011-08-02 ENCOUNTER — Encounter: Payer: Self-pay | Admitting: Internal Medicine

## 2011-08-02 VITALS — BP 122/70 | HR 76 | Temp 97.8°F

## 2011-08-02 DIAGNOSIS — J019 Acute sinusitis, unspecified: Secondary | ICD-10-CM

## 2011-08-02 DIAGNOSIS — M3320 Polymyositis, organ involvement unspecified: Secondary | ICD-10-CM

## 2011-08-02 MED ORDER — FLUTICASONE PROPIONATE 50 MCG/ACT NA SUSP: 2.0000 | Freq: Every day | NASAL | Status: DC

## 2011-08-02 MED ORDER — AMOXICILLIN-POT CLAVULANATE 875-125 MG PO TABS: 1.0000 | ORAL_TABLET | Freq: Two times a day (BID) | ORAL | Status: AC

## 2011-08-02 NOTE — Assessment & Plan Note (Signed)
On chronic methotrexate  follows with rheumatology Dr. Kellie Simmering and pulmonary Dr. Craige Cotta for same

## 2011-08-02 NOTE — Patient Instructions (Signed)
It was good to see you today. For your sinus symptoms, start Flonase spray daily for next 30 days and Augmentin antibiotics for next 10 days as discussed - Your prescription(s) have been submitted to your pharmacy. Please take as directed and contact our office if you believe you are having problem(s) with the medication(s). Okay to continue Sudafed over-the-counter as a discussed Please call if symptoms unimproved, sooner if worse

## 2011-08-02 NOTE — Progress Notes (Signed)
Subjective:    Patient ID: Jill Harper, female    DOB: February 08, 1966, 45 y.o.   MRN: [id number]  HPI  Complains of sinus pressure Located in the frontal and maxillary region bilaterally Onset of symptoms 3 weeks ago, gradually progressive pressure Associated with mild frontal headache and upper toothaches/pressure Pain worse with leaning head forward and down Denies nasal discharge but positive postnasal drip No specific fever but chills and fatigue No change in chronic immunosuppression methotrexate dose Started Sudafed 48 hours ago with questionable minimal improvement> increase in postnasal drip yellow Ambroise discoloration of sputum No history of prior allergies or sinus problems  Past Medical History  Diagnosis Date  . Bronchiectasis     PFT 03-03-08 FEV1 3.18 (103%), FVC 3,83 (97%), FEV1% 83, TLC 4,85 (83%),DLCO 59%, no BD. PFT 12-21-08 FEV1 3.22 (109%), FVC 3.71 (97%), FEV1% 87, TLC 4.68 (83%), DLCO 66%, no BD  . Polymyositis     Dr [name]; chronic MTX  . MVP (mitral valve prolapse)   . Fibromyalgia   . Abnormal liver function test   . Menorrhagia   . OAB (overactive bladder)   . MIGRAINE HEADACHE     Review of Systems  HENT: Positive for congestion, postnasal drip and sinus pressure. Negative for ear pain, nosebleeds, rhinorrhea, sneezing and dental problem.   Respiratory: Negative for cough, chest tightness and shortness of breath.   Cardiovascular: Negative for palpitations and leg swelling.  Neurological: Negative for seizures, facial asymmetry and weakness.       Objective:   Physical Exam BP 122/70  Pulse 76  Temp(Src) 97.8 F (36.6 C) (Oral)  SpO2 98% Constitutional: She appears well-developed and well-nourished. No distress.  HENT: Head: Normocephalic and atraumatic, mild swelling over right maxillary region with tenderness over both maxillary greater than frontal regions. Ears: B TMs ok, no erythema or effusion; Nose: Nose swollen turbinates but no  purulent discharge or ulceration. Mouth/Throat: Oropharynx is mildly red but clear and moist. No oropharyngeal exudate.  Eyes: Conjunctivae and EOM are normal. Pupils are equal, round, and reactive to light. No scleral icterus.  Neck: Normal range of motion. Neck supple. No JVD present. No thyromegaly present.  Cardiovascular: Normal rate, regular rhythm and normal heart sounds.  No murmur heard. No BLE edema. Pulmonary/Chest: Effort normal and breath sounds normal. No respiratory distress. She has no wheezes.  Neurological: She is alert and oriented to person, place, and time. No cranial nerve deficit. Coordination normal.  Skin: Skin is warm and dry. No rash noted. No erythema.  Psychiatric: She has a normal mood and affect. Her behavior is normal. Judgment and thought content normal.   Lab Results  Component Value Date   WBC 8.1 06/14/2011   HGB 12.9 06/14/2011   HCT 38.3 06/14/2011   PLT 200.0 06/14/2011   GLUCOSE 75 06/14/2011   CHOL 133 06/14/2011   TRIG 34.0 06/14/2011   HDL 65.80 06/14/2011   LDLCALC 60 06/14/2011   ALT 22 06/14/2011   AST 21 06/14/2011   NA 140 06/14/2011   K 3.7 06/14/2011   CL 107 06/14/2011   CREATININE 0.6 06/14/2011   BUN 14 06/14/2011   CO2 28 06/14/2011   TSH 2.26 06/14/2011        Assessment & Plan:  Pansinusitis - acute progressive symptoms over past 3 weeks Subjective fever and chills but afebrile today High-risk due to chronic immunosuppression therapy -but nontoxic today Treat for typical bacterial infection with 10 days Augmentin and initiation of nasal  steroid Continue systemic decongestants and use Tylenol as needed Patient to call if symptoms worse or unimproved with therapy

## 2011-09-11 ENCOUNTER — Other Ambulatory Visit (INDEPENDENT_AMBULATORY_CARE_PROVIDER_SITE_OTHER): Payer: Federal, State, Local not specified - PPO

## 2011-09-11 ENCOUNTER — Encounter: Payer: Self-pay | Admitting: Internal Medicine

## 2011-09-11 ENCOUNTER — Ambulatory Visit (INDEPENDENT_AMBULATORY_CARE_PROVIDER_SITE_OTHER): Payer: Federal, State, Local not specified - PPO | Admitting: Internal Medicine

## 2011-09-11 DIAGNOSIS — R5383 Other fatigue: Secondary | ICD-10-CM

## 2011-09-11 DIAGNOSIS — R5381 Other malaise: Secondary | ICD-10-CM

## 2011-09-11 DIAGNOSIS — R079 Chest pain, unspecified: Secondary | ICD-10-CM

## 2011-09-11 DIAGNOSIS — M3320 Polymyositis, organ involvement unspecified: Secondary | ICD-10-CM

## 2011-09-11 LAB — CBC WITH DIFFERENTIAL/PLATELET
Basophils Absolute: 0 10*3/uL (ref 0.0–0.1)
HCT: 39.1 % (ref 36.0–46.0)
Lymphs Abs: 1.3 10*3/uL (ref 0.7–4.0)
MCHC: 34.1 g/dL (ref 30.0–36.0)
MCV: 90.3 fl (ref 78.0–100.0)
Monocytes Absolute: 0.5 10*3/uL (ref 0.1–1.0)
Platelets: 197 10*3/uL (ref 150.0–400.0)
RDW: 13.3 % (ref 11.5–14.6)

## 2011-09-11 LAB — BASIC METABOLIC PANEL
BUN: 14 mg/dL (ref 6–23)
Chloride: 107 mEq/L (ref 96–112)
Glucose, Bld: 71 mg/dL (ref 70–99)
Potassium: 4 mEq/L (ref 3.5–5.1)

## 2011-09-11 LAB — SEDIMENTATION RATE: Sed Rate: 21 mm/hr (ref 0–22)

## 2011-09-11 LAB — HEPATIC FUNCTION PANEL: Total Bilirubin: 0.8 mg/dL (ref 0.3–1.2)

## 2011-09-11 NOTE — Progress Notes (Signed)
Subjective:    Patient ID: Jill Harper, female    DOB: Jan 11, 1966, 46 y.o.   MRN: [id number]  HPI  complains of chest pain  Onset 5 days ago - 10/10 initially, now 2-3/10 Located L anterior chest Radiates to R side chest across sternum Described initially as gripping and tight Lasted >12h Unchanged by intake, activity, position or meds (seltzer, Bbloc or muscle relaxer) lessenede discomfort to heavy pressure persisting for last 3 days - 2-3/10 now  Past Medical History  Diagnosis Date  . Bronchiectasis     PFT 03-03-08 FEV1 3.18 (103%), FVC 3,83 (97%), FEV1% 83, TLC 4,85 (83%),DLCO 59%, no BD. PFT 12-21-08 FEV1 3.22 (109%), FVC 3.71 (97%), FEV1% 87, TLC 4.68 (83%), DLCO 66%, no BD  . Polymyositis     Dr [name]; chronic MTX  . MVP (mitral valve prolapse)   . Fibromyalgia   . Abnormal liver function test   . Menorrhagia   . OAB (overactive bladder)   . MIGRAINE HEADACHE     Review of Systems  Constitutional: Positive for fatigue. Negative for fever.  Respiratory: Negative for cough, chest tightness and shortness of breath.   Cardiovascular: Positive for chest pain. Negative for palpitations and leg swelling.  Gastrointestinal: Negative for nausea, vomiting and abdominal pain.  Neurological: Negative for seizures, facial asymmetry and weakness.       Objective:   Physical Exam  BP 110/70  Pulse 80  Temp(Src) 99.6 F (37.6 C) (Oral)  Ht 5\' 7"  (1.702 m)  Wt 226 lb 4 oz (102.626 kg)  BMI 35.44 kg/m2  SpO2 98% Wt Readings from Last 3 Encounters:  09/11/11 226 lb 4 oz (102.626 kg)  06/18/11 215 lb 8 oz (97.75 kg)  05/04/11 214 lb (97.07 kg)   Constitutional: She appears well-developed and well-nourished. No distress.  Eyes: Conjunctivae and EOM are normal. Pupils are equal, round, and reactive to light. No scleral icterus.  Neck: Normal range of motion. Neck supple. No JVD present. No thyromegaly present.  Cardiovascular: Normal rate, regular rhythm and normal  heart sounds.  No murmur or rub heard. No BLE edema. Pulmonary/Chest: Effort normal and breath sounds normal. No respiratory distress. She has no wheezes.  Neurological: She is alert and oriented to person, place, and time. No cranial nerve deficit. Coordination normal.  Skin: Skin is warm and dry. No rash noted. No erythema.  Psychiatric: She has a normal mood and affect. Her behavior is normal. Judgment and thought content normal.   Lab Results  Component Value Date   WBC 8.1 06/14/2011   HGB 12.9 06/14/2011   HCT 38.3 06/14/2011   PLT 200.0 06/14/2011   GLUCOSE 75 06/14/2011   CHOL 133 06/14/2011   TRIG 34.0 06/14/2011   HDL 65.80 06/14/2011   LDLCALC 60 06/14/2011   ALT 22 06/14/2011   AST 21 06/14/2011   NA 140 06/14/2011   K 3.7 06/14/2011   CL 107 06/14/2011   CREATININE 0.6 06/14/2011   BUN 14 06/14/2011   CO2 28 06/14/2011   TSH 2.26 06/14/2011   EKG: NSR @ 72 - no ST-T changes     Assessment & Plan:   1) Chest pain - episode 5 days ago reviewed as above Tight gripping pain x 12h unrelieved with belching, beta-blocker or muscle relaxer Pain lessened to "pressure" but remains unresolved  2) polymyositis - see below - on immunosuppressant and follows with rheum for same - stable dz  3) fatigue   Plan: EKG today >  no ischemic change Check labs, CXR and arrange for stress echo  No med changes at this time

## 2011-09-11 NOTE — Patient Instructions (Addendum)
It was good to see you today. EKG today looks ok - no evidence of heart problems at this time - but will need further evaluation of same Test(s) ordered today. Your results will be called to you after review (48-72hours after test completion). If any changes need to be made, you will be notified at that time. we'll make referral for stress test of heart . Our office will contact you regarding appointment(s) once made. Medications reviewed, no changes at this time. if your symptoms continue to worsen (pain, fever, etc), or if you are unable take anything by mouth (pills, fluids, etc) before outpatient tests complete, you should go to the emergency room for further evaluation and treatment.

## 2011-09-11 NOTE — Assessment & Plan Note (Signed)
On chronic methotrexate  follows with rheumatology Dr. Truslow and pulmonary Dr. Sood for same 

## 2011-09-20 ENCOUNTER — Ambulatory Visit (HOSPITAL_COMMUNITY): Payer: Federal, State, Local not specified - PPO | Attending: Internal Medicine | Admitting: Radiology

## 2011-09-20 ENCOUNTER — Ambulatory Visit (HOSPITAL_BASED_OUTPATIENT_CLINIC_OR_DEPARTMENT_OTHER): Payer: Federal, State, Local not specified - PPO | Admitting: Radiology

## 2011-09-20 ENCOUNTER — Other Ambulatory Visit (HOSPITAL_COMMUNITY): Payer: Self-pay | Admitting: Internal Medicine

## 2011-09-20 DIAGNOSIS — R072 Precordial pain: Secondary | ICD-10-CM

## 2011-09-20 DIAGNOSIS — R079 Chest pain, unspecified: Secondary | ICD-10-CM | POA: Insufficient documentation

## 2011-09-20 DIAGNOSIS — I059 Rheumatic mitral valve disease, unspecified: Secondary | ICD-10-CM | POA: Insufficient documentation

## 2011-09-20 DIAGNOSIS — Z8249 Family history of ischemic heart disease and other diseases of the circulatory system: Secondary | ICD-10-CM | POA: Insufficient documentation

## 2011-09-20 DIAGNOSIS — R0989 Other specified symptoms and signs involving the circulatory and respiratory systems: Secondary | ICD-10-CM

## 2011-09-20 DIAGNOSIS — M3320 Polymyositis, organ involvement unspecified: Secondary | ICD-10-CM

## 2011-09-20 DIAGNOSIS — IMO0001 Reserved for inherently not codable concepts without codable children: Secondary | ICD-10-CM | POA: Insufficient documentation

## 2011-09-20 DIAGNOSIS — G43909 Migraine, unspecified, not intractable, without status migrainosus: Secondary | ICD-10-CM | POA: Insufficient documentation

## 2011-09-20 DIAGNOSIS — J479 Bronchiectasis, uncomplicated: Secondary | ICD-10-CM | POA: Insufficient documentation

## 2011-10-10 ENCOUNTER — Ambulatory Visit (INDEPENDENT_AMBULATORY_CARE_PROVIDER_SITE_OTHER): Payer: Federal, State, Local not specified - PPO | Admitting: Pulmonary Disease

## 2011-10-10 ENCOUNTER — Ambulatory Visit (INDEPENDENT_AMBULATORY_CARE_PROVIDER_SITE_OTHER)
Admission: RE | Admit: 2011-10-10 | Discharge: 2011-10-10 | Disposition: A | Discharge status: Home / Self Care | Payer: Federal, State, Local not specified - PPO | Source: Ambulatory Visit | Attending: Pulmonary Disease | Admitting: Pulmonary Disease

## 2011-10-10 ENCOUNTER — Ambulatory Visit: Payer: Federal, State, Local not specified - PPO

## 2011-10-10 ENCOUNTER — Encounter: Payer: Self-pay | Admitting: Pulmonary Disease

## 2011-10-10 VITALS — BP 134/82 | HR 69 | Temp 97.3°F | Ht 67.5 in | Wt 232.6 lb

## 2011-10-10 DIAGNOSIS — J479 Bronchiectasis, uncomplicated: Secondary | ICD-10-CM

## 2011-10-10 MED ORDER — PREDNISONE 10 MG PO TABS: ORAL_TABLET | ORAL | Status: DC

## 2011-10-10 MED ORDER — BECLOMETHASONE DIPROPIONATE 80 MCG/ACT IN AERS: 2.0000 | INHALATION_SPRAY | Freq: Two times a day (BID) | RESPIRATORY_TRACT | Status: DC

## 2011-10-10 NOTE — Assessment & Plan Note (Signed)
She has progressive symptoms.  I have advised her to try using her albuterol more.  Will give her course of prednisone.  Will arrange for PFT's and chest xray today.  Will follow up in two weeks, and re-assess.  She may also need f/u CT chest.  I don't think she needs antibiotics.  She is to continue using Qvar.

## 2011-10-10 NOTE — Patient Instructions (Signed)
Prednisone 10 mg pills>>3 pills for 3 days, then 2 pills for 3 days, then 1 pill for 3 days, then 1/2 pill for 4 days Chest xray today Will schedule breathing test (PFT) Follow up in 2 to 3 weeks

## 2011-10-10 NOTE — Progress Notes (Signed)
Chief Complaint  Patient presents with  . Follow-up    Pt c/o increased sob, cold air makes this worse...breathing is labored when talking...increased mucus prod with cough, bloody at times...recent Echo, stress test and EKG.    History of Present Illness: Jill Harper is a 46 y.o. female with bronchiectasis in the setting of polymyositis.  She has been getting more trouble with her breathing since February.  She went out into the cold, and felt like her heart was getting squeezed.  She has tried taking prilosec and flexeril for this w/o help.  She had recent cardiac evaluation which was negative.  She was seen by PCP and rheumatology, and advised to follow up with pulmonary.  She has been feeling more fatigued.  She has low grade temp of 99.8, and gets better with tylenol.  She has more cough with dark sputum, and some times has blood streaks in this.  She also has more sinus congestion.  She is not wheezing much.  She continues to use Qvar.  She has not been using her proventil.  She forgets about this.   Past Medical History  Diagnosis Date  . Bronchiectasis     PFT 03-03-08 FEV1 3.18 (103%), FVC 3,83 (97%), FEV1% 83, TLC 4,85 (83%),DLCO 59%, no BD. PFT 12-21-08 FEV1 3.22 (109%), FVC 3.71 (97%), FEV1% 87, TLC 4.68 (83%), DLCO 66%, no BD  . Polymyositis     Dr [name]; chronic MTX  . MVP (mitral valve prolapse)   . Fibromyalgia   . Abnormal liver function test   . Menorrhagia   . OAB (overactive bladder)   . MIGRAINE HEADACHE     Past Surgical History  Procedure Date  . Cardiac catheterization 2002    normal  . Dilation and curettage of uterus 08-31-08    Dr Zelphia Cairo    Allergies  Allergen Reactions  . Sulfonamide Derivatives     REACTION: hives    Physical Exam:  Blood pressure 134/82, pulse 69, temperature 97.3 F (36.3 C), temperature source Oral, height 5' 7.5" (1.715 m), weight 232 lb 9.6 oz (105.507 kg), SpO2 98.00%. Body mass index is 35.89  kg/(m^2). Wt Readings from Last 2 Encounters:  10/10/11 232 lb 9.6 oz (105.507 kg)  09/11/11 226 lb 4 oz (102.626 kg)   General - no distress HEENT - no sinus tenderness, no oral exudate, no LAN  Cardiac - s1s2 regular, no murmur  Chest - no wheeze/rales  Abd - soft, non-tender  Ext - no edema  Neuro - normal strength, CN intact  Psych - normal mood, behavior   Assessment/Plan:  Outpatient Encounter Prescriptions as of 10/10/2011  Medication Sig Dispense Refill  . albuterol (PROVENTIL HFA) 108 (90 BASE) MCG/ACT inhaler Inhale 2 puffs into the lungs 4 (four) times daily as needed.        [name] atenolol (TENORMIN) 25 MG tablet Take 0.5 tablets (12.5 mg total) by mouth daily as needed.  30 tablet  0  . beclomethasone (QVAR) 80 MCG/ACT inhaler Inhale 2 puffs into the lungs 2 (two) times daily.        . cyclobenzaprine (FLEXERIL) 10 MG tablet Take 5 mg by mouth at bedtime as needed.        . fluticasone (FLONASE) 50 MCG/ACT nasal spray Place 2 sprays into the nose daily as needed.      . folic acid (FOLVITE) 1 MG tablet Take 1 mg by mouth daily.        [name] guaiFENesin (  MUCINEX) 600 MG 12 hr tablet Take 1,200 mg by mouth 2 (two) times daily as needed.       . isometheptene-acetaminophen-dichloralphenazone (MIDRIN) 65-325-100 MG per capsule Take 1 capsule by mouth at onset of headache; repeat in 2 hours. Max is 8 capsules per day       . meclizine (ANTIVERT) 25 MG tablet Take 25 mg by mouth every 6 (six) hours as needed.        . methotrexate (RHEUMATREX) 2.5 MG tablet Take 8 pills every Saturday. Caution:Chemotherapy. Protect from light.       . SUMAtriptan (IMITREX) 50 MG tablet Take 50 mg by mouth every 2 (two) hours as needed.        . traMADol (ULTRAM) 50 MG tablet Take 50 mg by mouth every 6 (six) hours as needed.        . VOLTAREN 1 % GEL As needed      . DISCONTD: fluticasone (FLONASE) 50 MCG/ACT nasal spray Place 2 sprays into the nose daily.  16 g  2    Jill Harper Pager:   [phone] 10/10/2011, 3:47 PM

## 2011-10-11 ENCOUNTER — Telehealth: Payer: Self-pay | Admitting: Pulmonary Disease

## 2011-10-11 NOTE — Telephone Encounter (Signed)
Dg Chest 2 View  10/10/2011  *RADIOLOGY REPORT*  Clinical Data: Progressive dyspnea.  Productive coughing.  History of bronchiectasis.  CHEST - 2 VIEW  Comparison: 05/04/2011.  Findings: The cardiac silhouette is normal size and shape.  Hilar and mediastinal contours appear stable.  There is slight ectasia of the aorta.  On the lateral image there is slight increased perihilar markings with minimal central peribronchial thickening. There is slight prominence of the reticular interstitial markings in the posterior base on lateral image.  No skeletal lesions are seen.  IMPRESSION: Slight increased perihilar markings with minimal central peribronchial thickening.  This may be associated with bronchitis, asthma, and reactive airway disease.  No peripheral infiltrate or consolidation is seen.  There is slight nonspecific prominence of reticular interstitial markings in the posterior base.  Original Report Authenticated By: Crawford Givens, M.D.    Discussed with pt over phone.  No change to treatment plan detailed from 10/10/11.

## 2011-10-30 ENCOUNTER — Ambulatory Visit (INDEPENDENT_AMBULATORY_CARE_PROVIDER_SITE_OTHER): Payer: Federal, State, Local not specified - PPO | Admitting: Pulmonary Disease

## 2011-10-30 DIAGNOSIS — J479 Bronchiectasis, uncomplicated: Secondary | ICD-10-CM

## 2011-10-30 LAB — PULMONARY FUNCTION TEST

## 2011-10-30 NOTE — Progress Notes (Signed)
PFT done today. 

## 2011-10-31 ENCOUNTER — Ambulatory Visit (INDEPENDENT_AMBULATORY_CARE_PROVIDER_SITE_OTHER): Payer: Federal, State, Local not specified - PPO | Admitting: Pulmonary Disease

## 2011-10-31 ENCOUNTER — Encounter: Payer: Self-pay | Admitting: Pulmonary Disease

## 2011-10-31 VITALS — BP 122/78 | HR 65 | Temp 98.9°F | Ht 67.5 in | Wt 232.2 lb

## 2011-10-31 DIAGNOSIS — J479 Bronchiectasis, uncomplicated: Secondary | ICD-10-CM

## 2011-10-31 NOTE — Patient Instructions (Signed)
Follow up in 6 months 

## 2011-10-31 NOTE — Assessment & Plan Note (Signed)
She has improved after recent therapy with prednisone.  I don't think she needs additional prednisone at this time.  Her PFT was essentially normal.  Will continue Qvar, prn albuterol, and bronchial hygiene.  She is to call if her symptoms progress.

## 2011-10-31 NOTE — Progress Notes (Signed)
Chief Complaint  Patient presents with  . Follow-up    pft 10/30/11.  Pt states her breathing is better from a few weeks, occasional cough w/ green to [place] phlem. denies any wheezing,/chest tightness   CC: Jill Harper  History of Present Illness: Jill Harper is a 46 y.o. female with bronchiectasis in the setting of polymyositis.  She has been feeling better after recent course of prednisone.  She finished this, and has not noticed her breathing get worse off prednisone.  She still has occasional cough with clear to [place] sputum.  She is not needing to use albuterol much.   Past Medical History  Diagnosis Date  . Bronchiectasis     PFT 03-03-08 FEV1 3.18 (103%), FVC 3,83 (97%), FEV1% 83, TLC 4,85 (83%),DLCO 59%, no BD. PFT 12-21-08 FEV1 3.22 (109%), FVC 3.71 (97%), FEV1% 87, TLC 4.68 (83%), DLCO 66%, no BD  . Polymyositis     Dr Jill Harper; chronic MTX  . MVP (mitral valve prolapse)   . Fibromyalgia   . Abnormal liver function test   . Menorrhagia   . OAB (overactive bladder)   . MIGRAINE HEADACHE     Past Surgical History  Procedure Date  . Cardiac catheterization 2002    normal  . Dilation and curettage of uterus 08-31-08    Dr Zelphia Cairo    Allergies  Allergen Reactions  . Sulfonamide Derivatives     REACTION: hives    Physical Exam:  Blood pressure 122/78, pulse 65, temperature 98.9 F (37.2 C), temperature source Oral, height 5' 7.5" (1.715 m), weight 232 lb 3.2 oz (105.325 kg), SpO2 100.00%.  Body mass index is 35.83 kg/(m^2).  Wt Readings from Last 2 Encounters:  10/31/11 232 lb 3.2 oz (105.325 kg)  10/10/11 232 lb 9.6 oz (105.507 kg)    General - no distress HEENT - no sinus tenderness, no oral exudate, no LAN  Cardiac - s1s2 regular, no murmur  Chest - no wheeze/rales  Abd - soft, non-tender  Ext - no edema  Neuro - normal strength, CN intact  Psych - normal mood, behavior   PFT 10/30/11>>FEV1 3.31 (114%), FEV1% 85, TLC 6.06 (108%),  DLCO 77%, no BD response   Assessment/Plan:  Outpatient Encounter Prescriptions as of 10/31/2011  Medication Sig Dispense Refill  . albuterol (PROVENTIL HFA) 108 (90 BASE) MCG/ACT inhaler Inhale 2 puffs into the lungs 4 (four) times daily as needed.        [name] atenolol (TENORMIN) 25 MG tablet Take 0.5 tablets (12.5 mg total) by mouth daily as needed.  30 tablet  0  . beclomethasone (QVAR) 80 MCG/ACT inhaler Inhale 2 puffs into the lungs 2 (two) times daily.  1 Inhaler  11  . cyclobenzaprine (FLEXERIL) 10 MG tablet Take 5 mg by mouth at bedtime as needed.        . fluticasone (FLONASE) 50 MCG/ACT nasal spray Place 2 sprays into the nose daily as needed.      . folic acid (FOLVITE) 1 MG tablet Take 1 mg by mouth daily.        [name] guaiFENesin (MUCINEX) 600 MG 12 hr tablet Take 1,200 mg by mouth 2 (two) times daily as needed.       . isometheptene-acetaminophen-dichloralphenazone (MIDRIN) 65-325-100 MG per capsule Take 1 capsule by mouth at onset of headache; repeat in 2 hours. Max is 8 capsules per day       . meclizine (ANTIVERT) 25 MG tablet Take 25 mg by mouth  every 6 (six) hours as needed.        . methotrexate (RHEUMATREX) 2.5 MG tablet Take 8 pills every Saturday. Caution:Chemotherapy. Protect from light.       . SUMAtriptan (IMITREX) 50 MG tablet Take 50 mg by mouth every 2 (two) hours as needed.        . traMADol (ULTRAM) 50 MG tablet Take 50 mg by mouth every 6 (six) hours as needed.        . VOLTAREN 1 % GEL As needed      . DISCONTD: predniSONE (DELTASONE) 10 MG tablet 3 pills for 3 days, then 2 pills for 3 days, then 1 pill for 3 days, then 1/2 pill for 4 days  20 tablet  0    Okey Zelek Pager:  [phone] 10/31/2011, 11:59 AM

## 2011-12-11 ENCOUNTER — Encounter: Payer: Self-pay | Admitting: Internal Medicine

## 2011-12-11 ENCOUNTER — Ambulatory Visit (INDEPENDENT_AMBULATORY_CARE_PROVIDER_SITE_OTHER): Payer: Federal, State, Local not specified - PPO | Admitting: Internal Medicine

## 2011-12-11 VITALS — BP 112/72 | HR 85 | Temp 98.1°F | Ht 67.5 in | Wt 228.8 lb

## 2011-12-11 DIAGNOSIS — R05 Cough: Secondary | ICD-10-CM

## 2011-12-11 DIAGNOSIS — J479 Bronchiectasis, uncomplicated: Secondary | ICD-10-CM

## 2011-12-11 DIAGNOSIS — J309 Allergic rhinitis, unspecified: Secondary | ICD-10-CM

## 2011-12-11 DIAGNOSIS — R059 Cough, unspecified: Secondary | ICD-10-CM

## 2011-12-11 MED ORDER — PHENYLEPH-PROMETHAZINE-COD 5-6.25-10 MG/5ML PO SYRP: 5.0000 mL | ORAL_SOLUTION | Freq: Three times a day (TID) | ORAL | Status: AC | PRN

## 2011-12-11 MED ORDER — CETIRIZINE HCL 10 MG PO TABS: 10.0000 mg | ORAL_TABLET | Freq: Every day | ORAL | Status: DC

## 2011-12-11 NOTE — Assessment & Plan Note (Signed)
Flare 10/2011 causing chest pain symptoms - resolved s/p pred No evidence of current flare but will reconsider same if not responding to tx of allergic rhinitis as above

## 2011-12-11 NOTE — Patient Instructions (Signed)
It was good to see you today. If you develop worsening symptoms or fever, call and we can reconsider antibiotics, but it does not appear necessary to use antibiotics at this time. Use Zyrtec 10mg  once daily, resume your flonase nose spray daily and start cough syrup as needed, especially at night Your prescription(s) have been submitted to your pharmacy. Please take as directed and contact our office if you believe you are having problem(s) with the medication(s). Use Albuterol rescue inhaler for cough symptoms during daytime as needed

## 2011-12-11 NOTE — Progress Notes (Signed)
  Subjective:    Patient ID: Jill Harper, female    DOB: Nov 18, 1965, 46 y.o.   MRN: [id number]  HPI  complains of nasal congestion and drainage associated with sore throat and cough, esp worse at night Not improved with OTC medications - benadryl, claritin, zyrtec  Past Medical History  Diagnosis Date  . Bronchiectasis     PFT 03-03-08 FEV1 3.18 (103%), FVC 3,83 (97%), FEV1% 83, TLC 4,85 (83%),DLCO 59%, no BD. PFT 12-21-08 FEV1 3.22 (109%), FVC 3.71 (97%), FEV1% 87, TLC 4.68 (83%), DLCO 66%, no BD  . Polymyositis     Dr [name]; chronic MTX  . MVP (mitral valve prolapse)   . Fibromyalgia   . Abnormal liver function test   . Menorrhagia   . OAB (overactive bladder)   . MIGRAINE HEADACHE     Review of Systems  Constitutional: Negative for fever and fatigue.  HENT: Positive for congestion, rhinorrhea, sneezing, postnasal drip and sinus pressure. Negative for nosebleeds.   Eyes: Negative for itching.  Respiratory: Positive for cough. Negative for chest tightness and wheezing.   Cardiovascular: Negative for chest pain and leg swelling.  Neurological: Negative for headaches.       Objective:   Physical Exam BP 112/72  Pulse 85  Temp(Src) 98.1 F (36.7 C) (Oral)  Ht 5' 7.5" (1.715 m)  Wt 228 lb 12.8 oz (103.783 kg)  BMI 35.31 kg/m2  SpO2 98%  Constitutional: She appears well-developed and well-nourished. No distress.  HENT: Head: Normocephalic and atraumatic, sinus non tender to palpation. Ears: B TMs ok, no erythema or effusion; Nose: Nose normal. Mouth/Throat: Oropharynx is clear and moist. Clear PND evident. No oropharyngeal exudate.  Eyes: Conjunctivae and EOM are normal. Pupils are equal, round, and reactive to light. No scleral icterus.  Neck: Normal range of motion. Neck supple. No JVD present. No thyromegaly present.  Cardiovascular: Normal rate, regular rhythm and normal heart sounds.  No murmur heard. No BLE edema. Pulmonary/Chest: Effort normal and breath  sounds normal. No respiratory distress. She has no wheezes.  Skin: Skin is warm and dry. No rash noted. No erythema.  Psychiatric: She has a normal mood and affect. Her behavior is normal. Judgment and thought content normal.   Lab Results  Component Value Date   WBC 7.1 09/11/2011   HGB 13.3 09/11/2011   HCT 39.1 09/11/2011   PLT 197.0 09/11/2011   GLUCOSE 71 09/11/2011   CHOL 133 06/14/2011   TRIG 34.0 06/14/2011   HDL 65.80 06/14/2011   LDLCALC 60 06/14/2011   ALT 24 09/11/2011   AST 23 09/11/2011   NA 138 09/11/2011   K 4.0 09/11/2011   CL 107 09/11/2011   CREATININE 0.6 09/11/2011   BUN 14 09/11/2011   CO2 24 09/11/2011   TSH 2.26 06/14/2011      Assessment & Plan:  allergic rhinitis Cough, suspect due to PND  will add daily antihistamine, resume nasal steroid and use prometh VC with codeine for nocturnal congestion/cough - no evidence for infection or bronchiectasis flare at this time, but pt agrees to call if worse or unimproved

## 2012-02-26 ENCOUNTER — Encounter: Payer: Self-pay | Admitting: Internal Medicine

## 2012-02-26 ENCOUNTER — Ambulatory Visit (INDEPENDENT_AMBULATORY_CARE_PROVIDER_SITE_OTHER): Payer: Federal, State, Local not specified - PPO | Admitting: Internal Medicine

## 2012-02-26 VITALS — BP 114/70 | HR 72 | Temp 98.2°F | Wt 230.0 lb

## 2012-02-26 DIAGNOSIS — M3320 Polymyositis, organ involvement unspecified: Secondary | ICD-10-CM

## 2012-02-26 DIAGNOSIS — G43909 Migraine, unspecified, not intractable, without status migrainosus: Secondary | ICD-10-CM

## 2012-02-26 MED ORDER — ELETRIPTAN HYDROBROMIDE 20 MG PO TABS: 20.0000 mg | ORAL_TABLET | ORAL | Status: DC | PRN

## 2012-02-26 NOTE — Assessment & Plan Note (Signed)
On chronic methotrexate - denies recent flares or change in symptoms  follows with rheumatology Dr. Kellie Simmering and pulmonary Dr. Craige Cotta for same

## 2012-02-26 NOTE — Assessment & Plan Note (Signed)
Chronic history of same since 1990s Change in pattern last week - duration >72h and not responding to Imitrex Today, no pain and neuro exam benign Change triptan and check MRI brain given co-morbid rheumatologic dz Continue Midrin as needed

## 2012-02-26 NOTE — Progress Notes (Signed)
Subjective:    Patient ID: Jill Harper, female    DOB: 10-07-1965, 46 y.o.   MRN: [id number]  Migraine  This is a recurrent problem. The current episode started in the past 7 days. The problem occurs intermittently. The problem has been waxing and waning. The pain is located in the occipital region. The pain does not radiate. The pain quality is similar to prior headaches (but episode last week lasted 3 days (change in pattern, usually <24h)). The pain is moderate. Associated symptoms include blurred vision (transient upon waking). Pertinent negatives include no abnormal behavior, coughing, eye pain, eye redness, eye watering, fever, hearing loss, loss of balance, nausea, neck pain, numbness, phonophobia, photophobia, scalp tenderness, seizures, sinus pressure, sore throat or tinnitus. The symptoms are aggravated by unknown. She has tried triptans and acetaminophen for the symptoms. The treatment provided mild relief. Her past medical history is significant for immunosuppression and migraine headaches. There is no history of cancer, hypertension, recent head traumas or sinus disease.   Past Medical History  Diagnosis Date  . Bronchiectasis     PFT 03-03-08 FEV1 3.18 (103%), FVC 3,83 (97%), FEV1% 83, TLC 4,85 (83%),DLCO 59%, no BD. PFT 12-21-08 FEV1 3.22 (109%), FVC 3.71 (97%), FEV1% 87, TLC 4.68 (83%), DLCO 66%, no BD  . Polymyositis     Dr [name]; chronic MTX  . MVP (mitral valve prolapse)   . Fibromyalgia   . Abnormal liver function test   . Menorrhagia   . OAB (overactive bladder)   . MIGRAINE HEADACHE     Review of Systems  Constitutional: Negative for fever.  HENT: Negative for hearing loss, sore throat, neck pain, sinus pressure and tinnitus.   Eyes: Positive for blurred vision (transient upon waking). Negative for photophobia, pain and redness.  Respiratory: Negative for cough.   Gastrointestinal: Negative for nausea.  Neurological: Negative for seizures, numbness and loss of  balance.       Objective:   Physical Exam BP 114/70  Pulse 72  Temp 98.2 F (36.8 C) (Oral)  Wt 230 lb (104.327 kg)  SpO2 98% Wt Readings from Last 3 Encounters:  02/26/12 230 lb (104.327 kg)  12/11/11 228 lb 12.8 oz (103.783 kg)  10/31/11 232 lb 3.2 oz (105.325 kg)   Constitutional: She appears well-developed and well-nourished. No distress.  HENT: Head: Normocephalic and atraumatic. Ears: B TMs ok, no erythema or effusion; Nose: Nose normal. Mouth/Throat: Oropharynx is clear and moist. No oropharyngeal exudate.  Eyes: Conjunctivae and EOM are normal. Pupils are equal, round, and reactive to light. No scleral icterus.  Neck: Normal range of motion. Neck supple. No JVD present. No thyromegaly present.  Cardiovascular: Normal rate, regular rhythm and normal heart sounds.  No murmur heard. No BLE edema. Pulmonary/Chest: Effort normal and breath sounds normal. No respiratory distress. She has no wheezes.  Neurological: She is alert and oriented to person, place, and time. No cranial nerve deficit. Coordination normal.  Skin: Skin is warm and dry. No rash noted. No erythema.  Psychiatric: She has a normal mood and affect. Her behavior is normal. Judgment and thought content normal.   Lab Results  Component Value Date   WBC 7.1 09/11/2011   HGB 13.3 09/11/2011   HCT 39.1 09/11/2011   PLT 197.0 09/11/2011   GLUCOSE 71 09/11/2011   CHOL 133 06/14/2011   TRIG 34.0 06/14/2011   HDL 65.80 06/14/2011   LDLCALC 60 06/14/2011   ALT 24 09/11/2011   AST 23 09/11/2011  NA 138 09/11/2011   K 4.0 09/11/2011   CL 107 09/11/2011   CREATININE 0.6 09/11/2011   BUN 14 09/11/2011   CO2 24 09/11/2011   TSH 2.26 06/14/2011       Assessment & Plan:   See problem list. Medications and labs reviewed today.

## 2012-02-26 NOTE — Patient Instructions (Signed)
It was good to see you today. We have reviewed your prior records including labs and tests today we'll make referral for MRI brain today. Our office will contact you regarding appointment(s) once made. Stop Imitrex and use Relpax as needed for migraine symptoms - also Midrin if continued headache pain symptoms  Your prescription(s) have been submitted to your pharmacy. Please take as directed and contact our office if you believe you are having problem(s) with the medication(s). Other Medications reviewed, no additional changes at this time.

## 2012-03-04 ENCOUNTER — Ambulatory Visit
Admission: RE | Admit: 2012-03-04 | Discharge: 2012-03-04 | Disposition: A | Discharge status: Home / Self Care | Payer: Federal, State, Local not specified - PPO | Source: Ambulatory Visit | Attending: Internal Medicine | Admitting: Internal Medicine

## 2012-03-04 DIAGNOSIS — G43909 Migraine, unspecified, not intractable, without status migrainosus: Secondary | ICD-10-CM

## 2012-05-07 ENCOUNTER — Ambulatory Visit (INDEPENDENT_AMBULATORY_CARE_PROVIDER_SITE_OTHER): Payer: Federal, State, Local not specified - PPO | Admitting: Pulmonary Disease

## 2012-05-07 ENCOUNTER — Encounter: Payer: Self-pay | Admitting: Pulmonary Disease

## 2012-05-07 VITALS — BP 118/62 | HR 65 | Temp 98.6°F | Ht 67.5 in | Wt 242.6 lb

## 2012-05-07 DIAGNOSIS — J479 Bronchiectasis, uncomplicated: Secondary | ICD-10-CM

## 2012-05-07 DIAGNOSIS — M3320 Polymyositis, organ involvement unspecified: Secondary | ICD-10-CM

## 2012-05-07 DIAGNOSIS — R6889 Other general symptoms and signs: Secondary | ICD-10-CM | POA: Insufficient documentation

## 2012-05-07 NOTE — Assessment & Plan Note (Signed)
Likely related to polymyositis.  She is stable on MTX and Qvar.  Will see if she can tolerate dose reduction of Qvar.  She can use albuterol as needed.  She will arrange for flu shot later this month.

## 2012-05-07 NOTE — Patient Instructions (Signed)
Try decrease dose of Qvar as tolerated.  Give two weeks in between each dose change. Call if symptoms get worse Follow up in 6 months

## 2012-05-07 NOTE — Assessment & Plan Note (Signed)
Advised her to monitor her symptoms, and call back if this gets worse.

## 2012-05-07 NOTE — Progress Notes (Signed)
Chief Complaint  Patient presents with  . Follow-up    breathing is okay. cough has unchanged-color of phlem is light but it doens't heppen daily. denies any wheezing, chest tx,   CC: Jill Harper Harper  History of Present Illness: Jill Harper Harper is a 46 y.o. female with bronchiectasis in the setting of polymyositis.  Overall she has been doing well.  She has not needed prednisone since her last visit.  Her muscle strength has been good, and she has been on stable dose of MTX.  Her lab work is followed by Dr. Kellie Simmering.  She is using Qvar, and this helps.  She is not using albuterol much.  She gets occasional cough with clear to yellow sputum.  She denies fever, sweats, wheeze, chest pain, or hemoptysis.    She has been clearing her throat.  She does not noticed this much, but has been told by others she does this.  She denies post-nasal drip or reflux.  Tests: CT chest 03/21/05>>cylindrical BTX b/l lower lobes with areas of GGO CT chest 02/17/08>>peripheral and basilar predominant peribronchovascular and subpleural GGO. CT chest 12/01/08>>Subpleural and peribronchovascular GGO is again seen Echo 07/07/09>>EF 60 to 65%, grade 1 diastolic dysfx, mild LA dilation Stress Echo 09/20/11>>normal PFT 10/30/11>>FEV1 3.31 (114%), FEV1% 85, TLC 6.06 (108%), DLCO 77%, no BD response  Past Medical History  Diagnosis Date  . Bronchiectasis        . Polymyositis     Dr Jill Harper Harper; chronic MTX  . MVP (mitral valve prolapse)   . Fibromyalgia   . Abnormal liver function test   . Menorrhagia   . OAB (overactive bladder)   . MIGRAINE HEADACHE     Past Surgical History  Procedure Date  . Cardiac catheterization 2002    normal  . Dilation and curettage of uterus 08-31-08    Dr Zelphia Cairo    Allergies  Allergen Reactions  . Sulfonamide Derivatives     REACTION: hives    Physical Exam:  Blood pressure 118/62, pulse 65, temperature 98.6 F (37 C), temperature source Oral, height 5'  7.5" (1.715 m), weight 242 lb 9.6 oz (110.043 kg), SpO2 98.00%.  Body mass index is 37.44 kg/(m^2).  Wt Readings from Last 2 Encounters:  05/07/12 242 lb 9.6 oz (110.043 kg)  02/26/12 230 lb (104.327 kg)    General - no distress HEENT - no sinus tenderness, no oral exudate, no LAN  Cardiac - s1s2 regular, no murmur  Chest - no wheeze/rales  Abd - soft, non-tender  Ext - no edema  Neuro - normal strength, CN intact  Psych - normal mood, behavior   Assessment/Plan:  Outpatient Encounter Prescriptions as of 05/07/2012  Medication Sig Dispense Refill  . albuterol (PROVENTIL HFA) 108 (90 BASE) MCG/ACT inhaler Inhale 2 puffs into the lungs 4 (four) times daily as needed.        [name] atenolol (TENORMIN) 25 MG tablet Take 0.5 tablets (12.5 mg total) by mouth daily as needed.  30 tablet  0  . beclomethasone (QVAR) 80 MCG/ACT inhaler Inhale 2 puffs into the lungs 2 (two) times daily.  1 Inhaler  11  . cetirizine (ZYRTEC) 10 MG tablet Take 1 tablet (10 mg total) by mouth daily.  30 tablet  11  . cyclobenzaprine (FLEXERIL) 10 MG tablet Take 5 mg by mouth at bedtime as needed.        . eletriptan (RELPAX) 20 MG tablet One tablet by mouth at onset of headache. May repeat in  2 hours if headache persists or recurs.  10 tablet  0  . fluticasone (FLONASE) 50 MCG/ACT nasal spray Place 2 sprays into the nose daily as needed.      . folic acid (FOLVITE) 1 MG tablet Take 1 mg by mouth daily.        [name] guaiFENesin (MUCINEX) 600 MG 12 hr tablet Take 1,200 mg by mouth 2 (two) times daily as needed.       . isometheptene-acetaminophen-dichloralphenazone (MIDRIN) 65-325-100 MG per capsule Take 1 capsule by mouth at onset of headache; repeat in 2 hours. Max is 8 capsules per day       . meclizine (ANTIVERT) 25 MG tablet Take 25 mg by mouth every 6 (six) hours as needed.        . methotrexate (RHEUMATREX) 2.5 MG tablet Take 8 pills every Saturday. Caution:Chemotherapy. Protect from light.       . traMADol (ULTRAM)  50 MG tablet Take 50 mg by mouth every 6 (six) hours as needed.        . VOLTAREN 1 % GEL As needed        Jill Harper Harper Pager:  [phone] 05/07/2012, 3:20 PM

## 2012-05-07 NOTE — Assessment & Plan Note (Signed)
She is to continue MTX per Dr. Truslow. 

## 2012-05-27 ENCOUNTER — Encounter: Payer: Self-pay | Admitting: Internal Medicine

## 2012-05-27 ENCOUNTER — Ambulatory Visit (INDEPENDENT_AMBULATORY_CARE_PROVIDER_SITE_OTHER): Payer: Federal, State, Local not specified - PPO | Admitting: Internal Medicine

## 2012-05-27 ENCOUNTER — Other Ambulatory Visit: Payer: Self-pay | Admitting: Cardiology

## 2012-05-27 ENCOUNTER — Encounter (INDEPENDENT_AMBULATORY_CARE_PROVIDER_SITE_OTHER): Payer: Federal, State, Local not specified - PPO

## 2012-05-27 VITALS — BP 110/70 | HR 94 | Temp 98.2°F | Ht 67.5 in | Wt 236.0 lb

## 2012-05-27 DIAGNOSIS — M7989 Other specified soft tissue disorders: Secondary | ICD-10-CM

## 2012-05-27 DIAGNOSIS — R609 Edema, unspecified: Secondary | ICD-10-CM

## 2012-05-27 DIAGNOSIS — I8390 Asymptomatic varicose veins of unspecified lower extremity: Secondary | ICD-10-CM

## 2012-05-27 DIAGNOSIS — M3320 Polymyositis, organ involvement unspecified: Secondary | ICD-10-CM

## 2012-05-27 DIAGNOSIS — M79609 Pain in unspecified limb: Secondary | ICD-10-CM

## 2012-05-27 NOTE — Assessment & Plan Note (Signed)
On chronic methotrexate - denies recent flares or change in symptoms  follows with rheumatology Dr. Truslow and pulmonary Dr. Sood for same  

## 2012-05-27 NOTE — Patient Instructions (Signed)
It was good to see you today. We have reviewed your prior records including labs and tests today I suspect the swelling and discoloration is from irritated varicose vein valve -continue ice and Tylenol as needed Refer today for venous Doppler to exclude other problem causing the swelling -we will then contact you after test is complete with the results and treatment (if any changes needed) Medications reviewed, no changes at this time.

## 2012-05-27 NOTE — Progress Notes (Signed)
  Subjective:    Patient ID: Jill Harper, female    DOB: [birth year], 46 y.o.   MRN: [id number]  HPI   complains of nodular swelling over proximal left calf Onset 4 days ago, gradually improving since that time Initially associated with tender, soft nodular swelling, progressed to flattened skin surface but with round skin discoloration at site of prior swelling Denies trauma, insect bite Denies prior history of clotting or soft tissue swelling No flare of myositis symptoms: Specifically no joint swelling, diffuse muscle tenderness, weakness, shortness of breath or rash No recent medication changes, last labs at rheumatology 8 weeks ago "stable" per patient report    Past Medical History  Diagnosis Date  . Bronchiectasis        . Polymyositis     Dr [name]; chronic MTX  . MVP (mitral valve prolapse)   . Fibromyalgia   . Abnormal liver function test   . Menorrhagia   . OAB (overactive bladder)   . MIGRAINE HEADACHE     Review of Systems  Constitutional: Negative for fever, activity change and fatigue.  Respiratory: Negative for chest tightness, shortness of breath and wheezing.   Cardiovascular: Positive for leg swelling. Negative for chest pain.  Musculoskeletal: Negative for myalgias, joint swelling and arthralgias.       Objective:   Physical Exam  BP 110/70  Pulse 94  Temp 98.2 F (36.8 C) (Oral)  Ht 5' 7.5" (1.715 m)  Wt 236 lb (107.049 kg)  BMI 36.42 kg/m2  SpO2 98%  Constitutional: She appears well-developed and well-nourished. No distress.  nontoxic Neck: Normal range of motion. Neck supple. No JVD present. No thyromegaly present.  Cardiovascular: Normal rate, regular rhythm and normal heart sounds.  No murmur heard. No BLE edema. Pulmonary/Chest: Effort normal and breath sounds normal. No respiratory distress. She has no wheezes. MSkel - 1.5 cm round skin discoloration on left proximal calf, lateral edge below posterior knee. Soft tissue fullness to  palpation but no visual evidence of swelling. Fullness of Baker's cyst posterior knee. Varicose veins evident on palpation exam  Skin: As above. Skin is warm and dry. No rash noted. No erythema.  Psychiatric: She has a normal mood and affect. Her behavior is normal. Judgment and thought content normal.   Lab Results  Component Value Date   WBC 7.1 09/11/2011   HGB 13.3 09/11/2011   HCT 39.1 09/11/2011   PLT 197.0 09/11/2011   GLUCOSE 71 09/11/2011   CHOL 133 06/14/2011   TRIG 34.0 06/14/2011   HDL 65.80 06/14/2011   LDLCALC 60 06/14/2011   ALT 24 09/11/2011   AST 23 09/11/2011   NA 138 09/11/2011   K 4.0 09/11/2011   CL 107 09/11/2011   CREATININE 0.6 09/11/2011   BUN 14 09/11/2011   CO2 24 09/11/2011   TSH 2.26 06/14/2011      Assessment & Plan:   Nodular swelling LLE, proximal calf Suspect varicose vein dysfunction with improving inflammation no evidence for vasculitis or other autoimmune/rheumatologic flare on history or exam  Check lower extremity venous Doppler now, rule out DVT, Baker's cyst and evaluate venous structure Reassurance provided, continue symptomatic care pending ultrasound results

## 2012-06-09 ENCOUNTER — Ambulatory Visit: Payer: Federal, State, Local not specified - PPO | Admitting: Internal Medicine

## 2012-06-18 ENCOUNTER — Encounter: Payer: Self-pay | Admitting: Internal Medicine

## 2012-06-18 ENCOUNTER — Ambulatory Visit (INDEPENDENT_AMBULATORY_CARE_PROVIDER_SITE_OTHER): Payer: Federal, State, Local not specified - PPO | Admitting: Internal Medicine

## 2012-06-18 VITALS — BP 110/70 | HR 66 | Temp 99.9°F | Ht 67.5 in | Wt 237.1 lb

## 2012-06-18 DIAGNOSIS — J479 Bronchiectasis, uncomplicated: Secondary | ICD-10-CM

## 2012-06-18 DIAGNOSIS — Z23 Encounter for immunization: Secondary | ICD-10-CM

## 2012-06-18 DIAGNOSIS — M3320 Polymyositis, organ involvement unspecified: Secondary | ICD-10-CM

## 2012-06-18 DIAGNOSIS — L52 Erythema nodosum: Secondary | ICD-10-CM

## 2012-06-18 NOTE — Assessment & Plan Note (Signed)
On chronic methotrexate - denies recent flares or change in symptoms  follows with rheumatology Dr. Kellie Simmering and pulmonary Dr. Craige Cotta for same Reports normal labs late 06/02/12 at rheum office

## 2012-06-18 NOTE — Assessment & Plan Note (Addendum)
Follows with pulm for same, felt related to PM, on MTX and Qvar for same

## 2012-06-18 NOTE — Addendum Note (Signed)
Addended by: Deatra James on: 06/18/2012 09:56 AM   Modules accepted: Orders

## 2012-06-18 NOTE — Progress Notes (Signed)
  Subjective:    Patient ID: Jill Harper, female    DOB: March 15, 1966, 46 y.o.   MRN: [id number]  HPI complains of tender/painful nodule/bruise at R ankle Onset 1 week ago, iproved Hx same - LLE eval here late 05/2012 Also episodes on BUE in past 3 mo Denies flare of PM symptoms No productive sputum, other rash, or URI symptoms   Past Medical History  Diagnosis Date  . Bronchiectasis        . Polymyositis     Dr [name]; chronic MTX  . MVP (mitral valve prolapse)   . Fibromyalgia   . Abnormal liver function test   . Menorrhagia   . OAB (overactive bladder)   . MIGRAINE HEADACHE     Review of Systems  Constitutional: Negative for fatigue and unexpected weight change.  HENT: Negative for sore throat and sinus pressure.   Cardiovascular: Negative for chest pain and leg swelling.  Gastrointestinal: Negative for nausea, vomiting, abdominal pain and diarrhea.  Genitourinary: Negative for dysuria and frequency.       Objective:   Physical Exam BP 110/70  Pulse 66  Temp 99.9 F (37.7 C) (Oral)  Ht 5' 7.5" (1.715 m)  Wt 237 lb 1.9 oz (107.557 kg)  BMI 36.59 kg/m2  SpO2 99% Wt Readings from Last 3 Encounters:  06/18/12 237 lb 1.9 oz (107.557 kg)  05/27/12 236 lb (107.049 kg)  05/07/12 242 lb 9.6 oz (110.043 kg)   Constitutional: She appears well-developed and well-nourished. No distress.  Neck: Normal range of motion. Neck supple. No JVD present. No thyromegaly present.  Cardiovascular: Normal rate, regular rhythm and normal heart sounds.  No murmur heard. No BLE edema. Pulmonary/Chest: Effort normal and breath sounds normal. No respiratory distress. She has no wheezes.  Neurological: She is alert and oriented to person, place, and time. No cranial nerve deficit. Coordination normal.  Skin: erythema nodosum nodule distal RLE - fading "bruise" at prior LLE nodule and R shin. Remaining skin is warm and dry. No rash noted. No erythema.  Psychiatric: She has a normal  mood and affect. Her behavior is normal. Judgment and thought content normal.    Lab Results  Component Value Date   WBC 7.1 09/11/2011   HGB 13.3 09/11/2011   HCT 39.1 09/11/2011   PLT 197.0 09/11/2011   GLUCOSE 71 09/11/2011   CHOL 133 06/14/2011   TRIG 34.0 06/14/2011   HDL 65.80 06/14/2011   LDLCALC 60 06/14/2011   ALT 24 09/11/2011   AST 23 09/11/2011   NA 138 09/11/2011   K 4.0 09/11/2011   CL 107 09/11/2011   CREATININE 0.6 09/11/2011   BUN 14 09/11/2011   CO2 24 09/11/2011   TSH 2.26 06/14/2011        Assessment & Plan:   erythema nodosum -  new nodule on R ankle, prior LLE episode reviewed Educated on dx and etiology The patient is reassured that these symptoms do not appear to represent a serious or threatening condition.

## 2012-06-18 NOTE — Patient Instructions (Signed)
It was good to see you today. I believe your tender leg spots are related to erythema nodosum -  If continued symptoms, please follow up with your rheumatologist as needed for review  Erythema Nodosum Erythema nodosum is also called "painful red nodules on the legs". Symptoms can include sudden onset of fever, fatigue and joint pains. Red, deep, tender, raised, bruise-like bumps form on the shin, or sometimes on the arms or trunk. The skin over the bumps is usually shiny. The symptoms usually last 1-2 weeks. The symptoms usually improve once the cause is treated. This illness may be caused by strep or fungal infection, drug reaction, pregnancy, or other medical conditions. Treating the cause is important to early recovery. Erythema nodosum occurs at any age and in both sexes but more often in young adult women. Erythema nodosum is not a skin infection. It is a reaction to something internal. In most cases the illness and bumps go away with treatment. You should avoid any medicine that may have caused this reaction. Antihistamine drugs, anti-inflammatory drugs or cortisone medicine may be prescribed to relieve symptoms. SSKI drops ([place]) taken with juice at breakfast, lunch and dinner may have an anti-inflammatory effect and speed the healing. Bed rest and limiting vigorous exercise help to shorten the course of erythema nodosum. Elevation of the affected limb also helps in recovery. As the condition gets better, the bumps flatten and your skin may heal with temporary bruise marks. These dark marks will clear up in several months and they are a good sign that your skin is healing.   SEEK IMMEDIATE MEDICAL CARE IF:    Your condition worsens, or if you have more severe symptoms such a high fever, sore throat, or repeated vomiting.  Document Released: 08/30/2004 Document Revised: 10/15/2011 Document Reviewed: 09/03/2008 [facility] Patient Information 2013 [place], [place].

## 2012-08-26 ENCOUNTER — Ambulatory Visit: Payer: Federal, State, Local not specified - PPO | Admitting: Internal Medicine

## 2012-08-28 ENCOUNTER — Encounter: Payer: Self-pay | Admitting: Internal Medicine

## 2012-08-28 ENCOUNTER — Ambulatory Visit (INDEPENDENT_AMBULATORY_CARE_PROVIDER_SITE_OTHER): Payer: Federal, State, Local not specified - PPO | Admitting: Internal Medicine

## 2012-08-28 ENCOUNTER — Other Ambulatory Visit (INDEPENDENT_AMBULATORY_CARE_PROVIDER_SITE_OTHER): Payer: Federal, State, Local not specified - PPO

## 2012-08-28 VITALS — BP 122/72 | HR 71 | Temp 99.0°F | Ht 67.5 in | Wt 242.8 lb

## 2012-08-28 DIAGNOSIS — Z0000 Encounter for general adult medical examination without abnormal findings: Secondary | ICD-10-CM

## 2012-08-28 DIAGNOSIS — I059 Rheumatic mitral valve disease, unspecified: Secondary | ICD-10-CM

## 2012-08-28 DIAGNOSIS — M3320 Polymyositis, organ involvement unspecified: Secondary | ICD-10-CM

## 2012-08-28 DIAGNOSIS — L52 Erythema nodosum: Secondary | ICD-10-CM

## 2012-08-28 DIAGNOSIS — I341 Nonrheumatic mitral (valve) prolapse: Secondary | ICD-10-CM

## 2012-08-28 LAB — URINALYSIS, ROUTINE W REFLEX MICROSCOPIC
Ketones, ur: NEGATIVE
Leukocytes, UA: NEGATIVE
Specific Gravity, Urine: 1.015 (ref 1.000–1.030)
Urine Glucose: NEGATIVE
pH: 6 (ref 5.0–8.0)

## 2012-08-28 LAB — LIPID PANEL
HDL: 57.1 mg/dL (ref >39.00)
Triglycerides: 51 mg/dL (ref 0.0–149.0)

## 2012-08-28 LAB — BASIC METABOLIC PANEL
Calcium: 9.1 mg/dL (ref 8.4–10.5)
GFR: 128.03 mL/min (ref >60.00)
Sodium: 139 mEq/L (ref 135–145)

## 2012-08-28 LAB — HEPATIC FUNCTION PANEL
ALT: 27 U/L (ref 0–35)
AST: 26 U/L (ref 0–37)
Albumin: 3.5 g/dL (ref 3.5–5.2)
Total Protein: 7.3 g/dL (ref 6.0–8.3)

## 2012-08-28 LAB — CBC WITH DIFFERENTIAL/PLATELET
Basophils Absolute: 0 10*3/uL (ref 0.0–0.1)
Basophils Relative: 0.5 % (ref 0.0–3.0)
Hemoglobin: 13.7 g/dL (ref 12.0–15.0)
Lymphocytes Relative: 13.9 % (ref 12.0–46.0)
Monocytes Relative: 6 % (ref 3.0–12.0)
Neutro Abs: 5.8 10*3/uL (ref 1.4–7.7)
RBC: 4.6 Mil/uL (ref 3.87–5.11)

## 2012-08-28 LAB — TSH: TSH: 3.27 u[IU]/mL (ref 0.35–5.50)

## 2012-08-28 MED ORDER — ATENOLOL 25 MG PO TABS: 12.5000 mg | ORAL_TABLET | Freq: Every day | ORAL | Status: DC | PRN

## 2012-08-28 MED ORDER — FLUTICASONE PROPIONATE 50 MCG/ACT NA SUSP: 2.0000 | Freq: Every day | NASAL | Status: DC | PRN

## 2012-08-28 NOTE — Assessment & Plan Note (Signed)
On chronic methotrexate - denies recent flares or change in symptoms  follows with rheumatology Dr. Kellie Simmering and pulmonary Dr. Craige Cotta for same Reports normal labs late 06/02/12 at rheum office, follows q40mo for CBC and LFTs

## 2012-08-28 NOTE — Assessment & Plan Note (Signed)
Occasional symptomatic palpitation - takes prn low dose beta-blocker for same Refill today The current medical regimen is effective;  continue present plan and medications.

## 2012-08-28 NOTE — Progress Notes (Signed)
Subjective:    Patient ID: Jill Harper, female    DOB: 05-21-1966, 47 y.o.   MRN: [id number]  Pt here for follow-up of chronic medical problems.  Pt overall reports feeling well. HPI Polymyositis- Pt denies flares of symptoms but c/o good and bad days.  Pt continuing MTX as prescribed and follows with Dr. Marin Roberts and Dr. Wendelyn Breslow. Erythema Nodosum- Pt seen last visit for nodules on lower legs in November.  Pt notes resolution of painful tenderness of nodules and legs with only residual bruising remaining for some sites.   Obesity- Pt aware of weight status.  Pt follows healthy diet but has trouble maintaining exercise regimen with polymyositis.  Past Medical History  Diagnosis Date  . Bronchiectasis        . Polymyositis     Dr [name]; chronic MTX  . MVP (mitral valve prolapse)   . Fibromyalgia   . Abnormal liver function test   . Menorrhagia   . OAB (overactive bladder)   . MIGRAINE HEADACHE     Review of Systems  Constitutional: Negative for fever, activity change and fatigue.  Respiratory: Negative for cough and shortness of breath.   Cardiovascular: Negative for chest pain and leg swelling.  Neurological: Negative for syncope and headaches.      Objective:   Physical Exam  Constitutional: She is oriented to person, place, and time. She appears well-developed and well-nourished. No distress.  Neck: Normal range of motion. Neck supple. No thyromegaly present.  Cardiovascular: Normal rate and regular rhythm.  Exam reveals no gallop and no friction rub.   Murmur heard.  Systolic murmur is present with a grade of 2/6       Mitral Valve Prolapse-end systolic soft click.  Pulmonary/Chest: Effort normal and breath sounds normal. No respiratory distress. She has no wheezes.  Abdominal: Soft. Normal appearance and bowel sounds are normal. She exhibits no distension. There is no tenderness.  Lymphadenopathy:    She has no cervical adenopathy.    Neurological: She is alert and oriented to person, place, and time.  Skin: Skin is warm and dry. No erythema.  Psychiatric: She has a normal mood and affect. Her behavior is normal. Judgment and thought content normal.   BP 122/72  Pulse 71  Temp 99 F (37.2 C) (Oral)  Ht 5' 7.5" (1.715 m)  Wt 242 lb 12.8 oz (110.133 kg)  BMI 37.47 kg/m2  SpO2 99% Wt Readings from Last 3 Encounters:  08/28/12 242 lb 12.8 oz (110.133 kg)  06/18/12 237 lb 1.9 oz (107.557 kg)  05/27/12 236 lb (107.049 kg)   Lab Results  Component Value Date   WBC 7.1 09/11/2011   HGB 13.3 09/11/2011   HCT 39.1 09/11/2011   PLT 197.0 09/11/2011   GLUCOSE 71 09/11/2011   CHOL 133 06/14/2011   TRIG 34.0 06/14/2011   HDL 65.80 06/14/2011   LDLCALC 60 06/14/2011   ALT 24 09/11/2011   AST 23 09/11/2011   NA 138 09/11/2011   K 4.0 09/11/2011   CL 107 09/11/2011   CREATININE 0.6 09/11/2011   BUN 14 09/11/2011   CO2 24 09/11/2011   TSH 2.26 06/14/2011      Assessment & Plan:  Follow-up for Polymyosyitis, Erythema Nodosum, and obesity.  Pt compliant with medications with no reports of adverse side effects. No changes to medical regimen today. Will continue healthy diet and attempt exercise as able.  Refill on atenolol for MVP and flonase for allergies/nasal congestion today.  Follow-up  with GYN in 10/2012 for yearly pelvic and mammogram.  Follow-up for yearly general medical physical in 1 year.  Advised to call with any issues/concerns before then.   I have personally reviewed this case with PA student. I also personally examined this patient. I agree with history and findings as documented above. I reviewed, discussed and approve of the assessment and plan as listed above. Rene Paci, MD

## 2012-08-28 NOTE — Assessment & Plan Note (Signed)
Flare summer-fall 2013, stable at this time Reviewed symptoms - pt will call as needed for any future flare

## 2012-08-28 NOTE — Progress Notes (Signed)
Subjective:    Patient ID: Jill Harper, female    DOB: August 06, 1966, 47 y.o.   MRN: [id number]  HPI  patient is here today for annual physical and follow up. Patient feels well overall and has no complaints.  Past Medical History  Diagnosis Date  . Bronchiectasis        . Polymyositis     Dr [name]; chronic MTX  . MVP (mitral valve prolapse)   . Fibromyalgia   . Abnormal liver function test   . Menorrhagia   . OAB (overactive bladder)   . MIGRAINE HEADACHE    Family History  Problem Relation Age of Onset  . Diabetes Mother   . Fibromyalgia Mother   . Ulcers Mother    History  Substance Use Topics  . Smoking status: Never Smoker   . Smokeless tobacco: Never Used     Comment: Married, lives with spouse. 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 Alcohol Use: No    Review of Systems  Constitutional: Negative for fever or weight change.  Respiratory: Negative for cough and shortness of breath.   Cardiovascular: Negative for chest pain or palpitations.  Gastrointestinal: Negative for abdominal pain, no bowel changes.  Musculoskeletal: Negative for gait problem or joint swelling.  Skin: Negative for rash.  Neurological: Negative for dizziness or headache.  No other specific complaints in a complete review of systems (except as listed in HPI above).     Objective:   Physical Exam  BP 122/72  Pulse 71  Temp 99 F (37.2 C) (Oral)  Ht 5' 7.5" (1.715 m)  Wt 242 lb 12.8 oz (110.133 kg)  BMI 37.47 kg/m2  SpO2 99% Wt Readings from Last 3 Encounters:  08/28/12 242 lb 12.8 oz (110.133 kg)  06/18/12 237 lb 1.9 oz (107.557 kg)  05/27/12 236 lb (107.049 kg)   Constitutional: She is overweight, but appears well-developed and well-nourished. No distress.  HENT: Head: Normocephalic and atraumatic. Ears: B TMs ok, no erythema or effusion; Nose: Nose normal. Mouth/Throat: Oropharynx is clear and moist. No oropharyngeal exudate.  Eyes:  Conjunctivae and EOM are normal. Pupils are equal, round, and reactive to light. No scleral icterus.  Neck: Normal range of motion. Neck supple. No JVD present. No thyromegaly present.  Cardiovascular: Normal rate, regular rhythm and normal heart sounds.  Soft MVP click, no significant murmur heard. No BLE edema. Pulmonary/Chest: Effort normal and breath sounds normal. No respiratory distress. She has no wheezes.  Abdominal: Soft. Bowel sounds are normal. She exhibits no distension. There is no tenderness. no masses Musculoskeletal: Normal range of motion, no joint effusions. No gross deformities Neurological: She is alert and oriented to person, place, and time. No cranial nerve deficit. Coordination normal.  Skin: Skin is warm and dry. No rash noted. No erythema. faded dark areas at prior EN sites over BLE Psychiatric: She has a normal mood and affect. Her behavior is normal. Judgment and thought content normal.   Lab Results  Component Value Date   WBC 7.1 09/11/2011   HGB 13.3 09/11/2011   HCT 39.1 09/11/2011   PLT 197.0 09/11/2011   GLUCOSE 71 09/11/2011   CHOL 133 06/14/2011   TRIG 34.0 06/14/2011   HDL 65.80 06/14/2011   LDLCALC 60 06/14/2011   ALT 24 09/11/2011   AST 23 09/11/2011   NA 138 09/11/2011   K 4.0 09/11/2011   CL 107 09/11/2011   CREATININE 0.6 09/11/2011   BUN  14 09/11/2011   CO2 24 09/11/2011   TSH 2.26 06/14/2011       Assessment & Plan:   CPX - v70.0 - Patient has been counseled on age-appropriate routine health concerns for screening and prevention. These are reviewed and up-to-date. Immunizations are up-to-date or declined. Labs ordered and reviewed.  Also See problem list. Medications and labs reviewed today.

## 2012-08-28 NOTE — Patient Instructions (Addendum)
It was good to see you today. We have reviewed your prior records including labs and tests today Health Maintenance reviewed - all recommended immunizations and age-appropriate screenings are up-to-date. Test(s) ordered today. Your results will be released to MyChart (or called to you) after review, usually within 72hours after test completion. If any changes need to be made, you will be notified at that same time. Medications reviewed and updated, no changes at this time. Refill on medication(s) as discussed today. Continue working with your other specialists as ongoing Please schedule followup in one year for annual medical physical and review, call sooner if problems. Health Maintenance, Females A healthy lifestyle and preventative care can promote health and wellness.  Maintain regular health, dental, and eye exams.   Eat a healthy diet. Foods like vegetables, fruits, whole grains, low-fat dairy products, and lean protein foods contain the nutrients you need without too many calories. Decrease your intake of foods high in solid fats, added sugars, and salt. Get information about a proper diet from your caregiver, if necessary.   Regular physical exercise is one of the most important things you can do for your health. Most adults should get at least 150 minutes of moderate-intensity exercise (any activity that increases your heart rate and causes you to sweat) each week. In addition, most adults need muscle-strengthening exercises on 2 or more days a week.     Maintain a healthy weight. The body mass index (BMI) is a screening tool to identify possible weight problems. It provides an estimate of body fat based on height and weight. Your caregiver can help determine your BMI, and can help you achieve or maintain a healthy weight. For adults 20 years and older:   A BMI below 18.5 is considered underweight.   A BMI of 18.5 to 24.9 is normal.   A BMI of 25 to 29.9 is considered overweight.   A  BMI of 30 and above is considered obese.   Maintain normal blood lipids and cholesterol by exercising and minimizing your intake of saturated fat. Eat a balanced diet with plenty of fruits and vegetables. Blood tests for lipids and cholesterol should begin at age 37 and be repeated every 5 years. If your lipid or cholesterol levels are high, you are over 50, or you are a high risk for heart disease, you may need your cholesterol levels checked more frequently. Ongoing high lipid and cholesterol levels should be treated with medicines if diet and exercise are not effective.   If you smoke, find out from your caregiver how to quit. If you do not use tobacco, do not start.   If you are pregnant, do not drink alcohol. If you are breastfeeding, be very cautious about drinking alcohol. If you are not pregnant and choose to drink alcohol, do not exceed 1 drink per day. One drink is considered to be 12 ounces (355 mL) of beer, 5 ounces (148 mL) of wine, or 1.5 ounces (44 mL) of liquor.   Avoid use of street drugs. Do not share needles with anyone. Ask for help if you need support or instructions about stopping the use of drugs.   High blood pressure causes heart disease and increases the risk of stroke. Blood pressure should be checked at least every 1 to 2 years. Ongoing high blood pressure should be treated with medicines, if weight loss and exercise are not effective.   If you are 79 to 47 years old, ask your caregiver if you should take  aspirin to prevent strokes.   Diabetes screening involves taking a blood sample to check your fasting blood sugar level. This should be done once every 3 years, after age 93, if you are within normal weight and without risk factors for diabetes. Testing should be considered at a younger age or be carried out more frequently if you are overweight and have at least 1 risk factor for diabetes.   Breast cancer screening is essential preventative care for women. You should  practice "breast self-awareness." This means understanding the normal appearance and feel of your breasts and may include breast self-examination. Any changes detected, no matter how small, should be reported to a caregiver. Women in their 43s and 30s should have a clinical breast exam (CBE) by a caregiver as part of a regular health exam every 1 to 3 years. After age 41, women should have a CBE every year. Starting at age 7, women should consider having a mammogram (breast X-ray) every year. Women who have a family history of breast cancer should talk to their caregiver about genetic screening. Women at a high risk of breast cancer should talk to their caregiver about having an MRI and a mammogram every year.   The Pap test is a screening test for cervical cancer. Women should have a Pap test starting at age 54. Between ages 71 and 12, Pap tests should be repeated every 2 years. Beginning at age 67, you should have a Pap test every 3 years as long as the past 3 Pap tests have been normal. If you had a hysterectomy for a problem that was not cancer or a condition that could lead to cancer, then you no longer need Pap tests. If you are between ages 40 and 35, and you have had normal Pap tests going back 10 years, you no longer need Pap tests. If you have had past treatment for cervical cancer or a condition that could lead to cancer, you need Pap tests and screening for cancer for at least 20 years after your treatment. If Pap tests have been discontinued, risk factors (such as a new sexual partner) need to be reassessed to determine if screening should be resumed. Some women have medical problems that increase the chance of getting cervical cancer. In these cases, your caregiver may recommend more frequent screening and Pap tests.   The human papillomavirus (HPV) test is an additional test that may be used for cervical cancer screening. The HPV test looks for the virus that can cause the cell changes on the  cervix. The cells collected during the Pap test can be tested for HPV. The HPV test could be used to screen women aged 20 years and older, and should be used in women of any age who have unclear Pap test results. After the age of 48, women should have HPV testing at the same frequency as a Pap test.   Colorectal cancer can be detected and often prevented. Most routine colorectal cancer screening begins at the age of 58 and continues through age 54. However, your caregiver may recommend screening at an earlier age if you have risk factors for colon cancer. On a yearly basis, your caregiver may provide home test kits to check for hidden blood in the stool. Use of a small camera at the end of a tube, to directly examine the colon (sigmoidoscopy or colonoscopy), can detect the earliest forms of colorectal cancer. Talk to your caregiver about this at age 88, when routine screening begins.  Direct examination of the colon should be repeated every 5 to 10 years through age 34, unless early forms of pre-cancerous polyps or small growths are found.   Hepatitis C blood testing is recommended for all people born from [birth year] through 1965 and any individual with known risks for hepatitis C.   Practice safe sex. Use condoms and avoid high-risk sexual practices to reduce the spread of sexually transmitted infections (STIs). Sexually active women aged 98 and younger should be checked for Chlamydia, which is a common sexually transmitted infection. Older women with new or multiple partners should also be tested for Chlamydia. Testing for other STIs is recommended if you are sexually active and at increased risk.   Osteoporosis is a disease in which the bones lose minerals and strength with aging. This can result in serious bone fractures. The risk of osteoporosis can be identified using a bone density scan. Women ages 23 and over and women at risk for fractures or osteoporosis should discuss screening with their caregivers. Ask  your caregiver whether you should be taking a calcium supplement or vitamin D to reduce the rate of osteoporosis.   Menopause can be associated with physical symptoms and risks. Hormone replacement therapy is available to decrease symptoms and risks. You should talk to your caregiver about whether hormone replacement therapy is right for you.   Use sunscreen with a sun protection factor (SPF) of 30 or greater. Apply sunscreen liberally and repeatedly throughout the day. You should seek shade when your shadow is shorter than you. Protect yourself by wearing long sleeves, pants, a wide-brimmed hat, and sunglasses year round, whenever you are outdoors.   Notify your caregiver of new moles or changes in moles, especially if there is a change in shape or color. Also notify your caregiver if a mole is larger than the size of a pencil eraser.   Stay current with your immunizations.  Document Released: 02/05/2011 Document Revised: 10/15/2011 Document Reviewed: 02/05/2011 [facility] Patient Information 2013 [place], [place].

## 2012-10-23 ENCOUNTER — Other Ambulatory Visit: Payer: Self-pay | Admitting: Obstetrics and Gynecology

## 2012-10-23 DIAGNOSIS — R928 Other abnormal and inconclusive findings on diagnostic imaging of breast: Secondary | ICD-10-CM

## 2012-11-03 ENCOUNTER — Ambulatory Visit
Admission: RE | Admit: 2012-11-03 | Discharge: 2012-11-03 | Disposition: A | Discharge status: Home / Self Care | Payer: Federal, State, Local not specified - PPO | Source: Ambulatory Visit | Attending: Obstetrics and Gynecology | Admitting: Obstetrics and Gynecology

## 2012-11-03 DIAGNOSIS — R928 Other abnormal and inconclusive findings on diagnostic imaging of breast: Secondary | ICD-10-CM

## 2012-11-05 ENCOUNTER — Other Ambulatory Visit (INDEPENDENT_AMBULATORY_CARE_PROVIDER_SITE_OTHER): Payer: Federal, State, Local not specified - PPO

## 2012-11-05 ENCOUNTER — Ambulatory Visit (INDEPENDENT_AMBULATORY_CARE_PROVIDER_SITE_OTHER): Payer: Federal, State, Local not specified - PPO | Admitting: Pulmonary Disease

## 2012-11-05 ENCOUNTER — Ambulatory Visit (INDEPENDENT_AMBULATORY_CARE_PROVIDER_SITE_OTHER)
Admission: RE | Admit: 2012-11-05 | Discharge: 2012-11-05 | Disposition: A | Discharge status: Home / Self Care | Payer: Federal, State, Local not specified - PPO | Source: Ambulatory Visit | Attending: Pulmonary Disease | Admitting: Pulmonary Disease

## 2012-11-05 ENCOUNTER — Encounter: Payer: Self-pay | Admitting: Pulmonary Disease

## 2012-11-05 VITALS — BP 118/72 | HR 68 | Temp 97.8°F | Ht 67.5 in | Wt 251.2 lb

## 2012-11-05 DIAGNOSIS — R6889 Other general symptoms and signs: Secondary | ICD-10-CM

## 2012-11-05 DIAGNOSIS — M3320 Polymyositis, organ involvement unspecified: Secondary | ICD-10-CM

## 2012-11-05 DIAGNOSIS — J479 Bronchiectasis, uncomplicated: Secondary | ICD-10-CM

## 2012-11-05 LAB — COMPREHENSIVE METABOLIC PANEL
Albumin: 3.4 g/dL — ABNORMAL LOW (ref 3.5–5.2)
BUN: 8 mg/dL (ref 6–23)
CO2: 28 mEq/L (ref 19–32)
Calcium: 8.8 mg/dL (ref 8.4–10.5)
Chloride: 105 mEq/L (ref 96–112)
GFR: 125.65 mL/min (ref >60.00)
Glucose, Bld: 95 mg/dL (ref 70–99)
Potassium: 4.2 mEq/L (ref 3.5–5.1)
Sodium: 138 mEq/L (ref 135–145)
Total Protein: 7.1 g/dL (ref 6.0–8.3)

## 2012-11-05 LAB — CBC WITH DIFFERENTIAL/PLATELET
Basophils Relative: 0.6 % (ref 0.0–3.0)
Eosinophils Relative: 2.1 % (ref 0.0–5.0)
Lymphocytes Relative: 20.1 % (ref 12.0–46.0)
MCV: 87.7 fl (ref 78.0–100.0)
Monocytes Absolute: 0.5 10*3/uL (ref 0.1–1.0)
Monocytes Relative: 7.7 % (ref 3.0–12.0)
Neutrophils Relative %: 69.5 % (ref 43.0–77.0)
RBC: 4.46 Mil/uL (ref 3.87–5.11)
WBC: 6.9 10*3/uL (ref 4.5–10.5)

## 2012-11-05 LAB — CK: Total CK: 53 U/L (ref 7–177)

## 2012-11-05 LAB — SEDIMENTATION RATE: Sed Rate: 22 mm/hr (ref 0–22)

## 2012-11-05 NOTE — Patient Instructions (Signed)
Lab work and chest xray today and call with results Will schedule breathing test and call with results Follow up in 6 months

## 2012-11-05 NOTE — Progress Notes (Signed)
Chief Complaint  Patient presents with  . Follow-up    Pt states her breathing has been okay. C/o cough w/ yellow-green phlem. Occasionally with have tinge of blood. Pt reports if she is trying to do several different things at one time then she feels like she does not get enough air. Denies any wheezing. Pt would life to discuss setting up PFT.     CC: Jill Harper  History of Present Illness: Jill Harper is a 47 y.o. female with bronchiectasis in the setting of polymyositis.  She has been getting more cough with sputum.  She has more discomfort in her chest.  She is noticing more trouble getting winded with activity.  She denies fever.  She has been on stable dose of methotrexate.    TESTS: CT chest 03/23/05 >> patchy b/l lower lung ASD with cylindrical BTX CT chest 02/17/08 >> peripheral and basilar predominant subpleural GGO CT chest 12/01/08 >> no change PFT 10/30/11 >> FEV1 3.31 (114%), FEV1% 85, TLC 6.06 (108%), DLCO 77%, no BD  Jill Harper  has a past medical history of Bronchiectasis; Polymyositis; MVP (mitral valve prolapse); Fibromyalgia; Abnormal liver function test; Menorrhagia; OAB (overactive bladder); and MIGRAINE HEADACHE.  Jill Harper  has past surgical history that includes Cardiac catheterization (2002) and Dilation and curettage of uterus (08-31-08).  Prior to Admission medications   Medication Sig Start Date End Date Taking? Authorizing Provider  albuterol (PROVENTIL HFA) 108 (90 BASE) MCG/ACT inhaler Inhale 2 puffs into the lungs 4 (four) times daily as needed.     Yes Historical Provider, MD  atenolol (TENORMIN) 25 MG tablet Take 0.5 tablets (12.5 mg total) by mouth daily as needed. 08/28/12  Yes Newt Lukes, MD  beclomethasone (QVAR) 80 MCG/ACT inhaler Inhale 2 puffs into the lungs daily. 10/10/11  Yes [name], MD  cetirizine (ZYRTEC) 10 MG tablet Take 10 mg by mouth daily as needed. 12/11/11 12/10/12 Yes Newt Lukes, MD  cyclobenzaprine (FLEXERIL)  10 MG tablet Take 5 mg by mouth at bedtime as needed.     Yes Historical Provider, MD  eletriptan (RELPAX) 20 MG tablet One tablet by mouth at onset of headache. May repeat in 2 hours if headache persists or recurs. 02/26/12  Yes Newt Lukes, MD  fluticasone (FLONASE) 50 MCG/ACT nasal spray Place 2 sprays into the nose daily as needed. 08/28/12  Yes Newt Lukes, MD  folic acid (FOLVITE) 1 MG tablet Take 1 mg by mouth daily.     Yes Historical Provider, MD  guaiFENesin (MUCINEX) 600 MG 12 hr tablet Take 1,200 mg by mouth 2 (two) times daily as needed.    Yes Historical Provider, MD  isometheptene-acetaminophen-dichloralphenazone (MIDRIN) 65-325-100 MG per capsule Take 1 capsule by mouth at onset of headache; repeat in 2 hours. Max is 8 capsules per day    Yes Historical Provider, MD  meclizine (ANTIVERT) 25 MG tablet Take 25 mg by mouth every 6 (six) hours as needed.     Yes Historical Provider, MD  methotrexate (RHEUMATREX) 2.5 MG tablet Take 8 pills every Saturday. Caution:Chemotherapy. Protect from light.    Yes Historical Provider, MD  traMADol (ULTRAM) 50 MG tablet Take 50 mg by mouth every 6 (six) hours as needed.     Yes Historical Provider, MD  VOLTAREN 1 % GEL As needed   Yes Historical Provider, MD    Allergies  Allergen Reactions  . Sulfonamide Derivatives     REACTION: hives  Physical Exam:  General - No distress ENT - No sinus tenderness, no oral exudate, no LAN Cardiac - s1s2 regular, no murmur Chest - No wheeze/rales/dullness Back - No focal tenderness Abd - Soft, non-tender Ext - No edema Neuro - Normal strength Skin - No rashes Psych - normal mood, and behavior   Assessment/Plan:  Chesley Mires, MD [place] [facility]/Critical Care/Sleep Pager:  [phone]

## 2012-11-09 NOTE — Assessment & Plan Note (Signed)
Likely related to post-nasal drip.  She is to continue flonase and zyrtec.

## 2012-11-09 NOTE — Assessment & Plan Note (Addendum)
She has some progression in her symptoms, but these are mild. Will check her chest xray, lab work, and PFT and call her with results.  Depending on results will determine if she needs repeat CT chest.  She is to continue qvar, prn albuterol, and mucinex.

## 2012-11-09 NOTE — Assessment & Plan Note (Signed)
She is to continue MTX per Dr. Kellie Simmering.

## 2012-11-13 ENCOUNTER — Telehealth: Payer: Self-pay | Admitting: Pulmonary Disease

## 2012-11-13 NOTE — Telephone Encounter (Signed)
11/05/2012  *RADIOLOGY REPORT*   Clinical Data: Polymyositis, bronchiectasis, increased dyspnea, follow-up   CHEST - 2 VIEW   Comparison: 10/10/2011  Findings: Normal heart size, mediastinal contours, and pulmonary vascularity. Bibasilar volume loss and mild atelectasis versus scarring. Minimal peribronchial thickening. No segmental consolidation, pleural effusion or pneumothorax. Bones unremarkable.  IMPRESSION: Mild bibasilar atelectasis versus scarring and minimal bronchitic changes. No acute infiltrate.    Original Report Authenticated By: Ulyses Southward, M.D.     [facility]     Component Value Date/Time   NA 138 11/05/2012 1008   K 4.2 11/05/2012 1008   CL 105 11/05/2012 1008   CO2 28 11/05/2012 1008   GLUCOSE 95 11/05/2012 1008   BUN 8 11/05/2012 1008   CREATININE 0.7 11/05/2012 1008   CALCIUM 8.8 11/05/2012 1008   PROT 7.1 11/05/2012 1008   ALBUMIN 3.4* 11/05/2012 1008   AST 23 11/05/2012 1008   ALT 23 11/05/2012 1008   ALKPHOS 72 11/05/2012 1008   BILITOT 0.7 11/05/2012 1008    CBC    Component Value Date/Time   WBC 6.9 11/05/2012 1008   RBC 4.46 11/05/2012 1008   HGB 13.4 11/05/2012 1008   HCT 39.1 11/05/2012 1008   PLT 242.0 11/05/2012 1008   MCV 87.7 11/05/2012 1008   MCHC 34.2 11/05/2012 1008   RDW 13.2 11/05/2012 1008   LYMPHSABS 1.4 11/05/2012 1008   MONOABS 0.5 11/05/2012 1008   EOSABS 0.1 11/05/2012 1008   BASOSABS 0.0 11/05/2012 1008    Lab Results  Component Value Date   CKTOTAL 53 11/05/2012    Lab Results  Component Value Date   ESRSEDRATE 22 11/05/2012    Left message detailing that lab results are normal, and CXR shows stable changes.  No change to current tx plan.  Await PFT results.  Advised pt to call back with questions.

## 2012-11-18 ENCOUNTER — Ambulatory Visit (INDEPENDENT_AMBULATORY_CARE_PROVIDER_SITE_OTHER): Payer: Federal, State, Local not specified - PPO | Admitting: Pulmonary Disease

## 2012-11-18 DIAGNOSIS — J479 Bronchiectasis, uncomplicated: Secondary | ICD-10-CM

## 2012-11-18 NOTE — Progress Notes (Signed)
PFT done today. 

## 2012-11-26 ENCOUNTER — Telehealth: Payer: Self-pay | Admitting: Pulmonary Disease

## 2012-11-26 NOTE — Telephone Encounter (Signed)
PFT 10/30/11 >> FEV1 3.31 (114%), FEV1% 85, TLC 6.06 (108%), DLCO 77%, no BD PFT 11/18/12 >> FEV1 3.18 (111%), FEV1% 84, TLC 5.34 (95%), DLCO 67%, no BD  Results d/w pt.  Explained that there is very minimal change from 2013.  Her recent lab work was unremarkable, and recent CXR did not show significant change.  She is still feeling fatigue.  Explained that it is less likely her fatigue is related to her lung disease.  Advised her to d/w her PCP and rheumatologist to further assess.  No change to current pulmonary regimen.

## 2013-01-28 ENCOUNTER — Encounter: Payer: Self-pay | Admitting: Internal Medicine

## 2013-01-28 ENCOUNTER — Ambulatory Visit (INDEPENDENT_AMBULATORY_CARE_PROVIDER_SITE_OTHER): Payer: Federal, State, Local not specified - PPO | Admitting: Internal Medicine

## 2013-01-28 VITALS — BP 128/78 | HR 95 | Temp 98.2°F | Wt 248.8 lb

## 2013-01-28 DIAGNOSIS — L52 Erythema nodosum: Secondary | ICD-10-CM

## 2013-01-28 DIAGNOSIS — M3320 Polymyositis, organ involvement unspecified: Secondary | ICD-10-CM

## 2013-01-28 MED ORDER — PREDNISONE 5 MG PO TABS: 5.0000 mg | ORAL_TABLET | Freq: Every day | ORAL | Status: DC

## 2013-01-28 NOTE — Progress Notes (Signed)
  Subjective:    Patient ID: Jill Harper, female    DOB: [birth year], 47 y.o.   MRN: [id number]  HPI   complains leg swelling B Onset 48h ago recent flare of myositis symptoms: Specifically diffuse muscle tenderness, weakness, shortness of breath and rash - has been on 6 weeks pred for tx same,  Last dose 24h prior to onset of recurrent symptoms    Past Medical History  Diagnosis Date  . Bronchiectasis        . Polymyositis     Dr [name]; chronic MTX  . MVP (mitral valve prolapse)   . Fibromyalgia   . Abnormal liver function test   . Menorrhagia   . OAB (overactive bladder)   . MIGRAINE HEADACHE     Review of Systems  Constitutional: Negative for fever, activity change and fatigue.  Respiratory: Negative for chest tightness, shortness of breath and wheezing.   Cardiovascular: Positive for leg swelling. Negative for chest pain.  Musculoskeletal: Negative for myalgias, joint swelling and arthralgias.       Objective:   Physical Exam  BP 128/78  Pulse 95  Temp(Src) 98.2 F (36.8 C) (Oral)  Wt 248 lb 12.8 oz (112.855 kg)  BMI 38.37 kg/m2  SpO2 98%  Constitutional: She is overweight, but appears well-developed and well-nourished. No distress.  nontoxic Neck: Normal range of motion. Neck supple. No JVD present. No thyromegaly present.  Cardiovascular: Normal rate, regular rhythm and normal heart sounds.  No murmur heard. No BLE edema. Pulmonary/Chest: Effort normal and breath sounds normal. No respiratory distress. She has no wheezes. MSkel - Soft tissue fullness B distal legs and forearms, tender to palpation but no visual evidence of swelling. No joint effusions or gross deformities - FROM  Skin: mild EN B distal legs. Skin is warm and dry. No rash noted. No erythema.  Psychiatric: She has a normal mood and affect. Her behavior is normal. Judgment and thought content normal.   Lab Results  Component Value Date   WBC 6.9 11/05/2012   HGB 13.4 11/05/2012   HCT 39.1  11/05/2012   PLT 242.0 11/05/2012   GLUCOSE 95 11/05/2012   CHOL 141 08/28/2012   TRIG 51.0 08/28/2012   HDL 57.10 08/28/2012   LDLCALC 74 08/28/2012   ALT 23 11/05/2012   AST 23 11/05/2012   NA 138 11/05/2012   K 4.2 11/05/2012   CL 105 11/05/2012   CREATININE 0.7 11/05/2012   BUN 8 11/05/2012   CO2 28 11/05/2012   TSH 3.27 08/28/2012      Assessment & Plan:   Flare of PM/EN -  Recent completion of 6 week pred tx for same - prompting recurrent symptoms within 24h of last pred dose Resume pred now - continue low dose and slow taper -  No edema or joint effusions on exam Pulm and CV exam benign Reassurance provided - to follow up rheum as planned, sooner if continued problems

## 2013-01-28 NOTE — Patient Instructions (Signed)
It was good to see you today. Continue "slow" prednisone taper as discussed: 5mg  daily x 1 week, then 2.5 mg daily x 1 week - then ?every other day - talk with rheumatology if questions or continued problems

## 2013-02-12 ENCOUNTER — Other Ambulatory Visit: Payer: Self-pay | Admitting: Pulmonary Disease

## 2013-05-25 ENCOUNTER — Ambulatory Visit (INDEPENDENT_AMBULATORY_CARE_PROVIDER_SITE_OTHER): Payer: Federal, State, Local not specified - PPO | Admitting: Pulmonary Disease

## 2013-05-25 ENCOUNTER — Encounter: Payer: Self-pay | Admitting: Pulmonary Disease

## 2013-05-25 VITALS — BP 126/84 | HR 74 | Ht 67.5 in | Wt 260.0 lb

## 2013-05-25 DIAGNOSIS — J479 Bronchiectasis, uncomplicated: Secondary | ICD-10-CM

## 2013-05-25 DIAGNOSIS — M3320 Polymyositis, organ involvement unspecified: Secondary | ICD-10-CM

## 2013-05-25 MED ORDER — LEVOFLOXACIN 500 MG PO TABS: 500.0000 mg | ORAL_TABLET | Freq: Every day | ORAL | Status: DC

## 2013-05-25 NOTE — Progress Notes (Signed)
Chief Complaint  Patient presents with  . Recurrent Pneumonia    Breathing is unchanged. Reports cough. Denies  chest tightness, SOB or wheezing.    CC: Jill Harper  History of Present Illness: Jill Harper is a 47 y.o. female with bronchiectasis in the setting of polymyositis.  She has intermittent cough with sputum, and appearance is greener than usual.  She denies wheezing or chest tightness.  She has not had fever, sweats, weight change, skin rashes, joint swelling, or gland swelling.  She has been off prednisone since August.  She had her MTX dose increased 3 weeks ago.  TESTS: CT chest 03/23/05 >> patchy b/l lower lung ASD with cylindrical BTX CT chest 02/17/08 >> peripheral and basilar predominant subpleural GGO CT chest 12/01/08 >> no change PFT 10/30/11 >> FEV1 3.31 (114%), FEV1% 85, TLC 6.06 (108%), DLCO 77%, no BD PFT 11/18/12 >> FEV1 3.18 (111%), FEV1% 84, TLC 5.34 (95%), DLCO 67%, no BD   Jill Harper  has a past medical history of Bronchiectasis; Polymyositis; MVP (mitral valve prolapse); Fibromyalgia; Abnormal liver function test; Menorrhagia; OAB (overactive bladder); and MIGRAINE HEADACHE.  Jill Harper  has past surgical history that includes Cardiac catheterization (2002) and Dilation and curettage of uterus (08-31-08).  Prior to Admission medications   Medication Sig Start Date End Date Taking? Authorizing Provider  albuterol (PROVENTIL HFA) 108 (90 BASE) MCG/ACT inhaler Inhale 2 puffs into the lungs 4 (four) times daily as needed.     Yes Historical Provider, MD  atenolol (TENORMIN) 25 MG tablet Take 0.5 tablets (12.5 mg total) by mouth daily as needed. 08/28/12  Yes Newt Lukes, MD  beclomethasone (QVAR) 80 MCG/ACT inhaler Inhale 2 puffs into the lungs daily. 10/10/11  Yes Jill Helling, MD  cetirizine (ZYRTEC) 10 MG tablet Take 10 mg by mouth daily as needed. 12/11/11 12/10/12 Yes Newt Lukes, MD  cyclobenzaprine (FLEXERIL) 10 MG tablet Take 5 mg by mouth at  bedtime as needed.     Yes Historical Provider, MD  eletriptan (RELPAX) 20 MG tablet One tablet by mouth at onset of headache. May repeat in 2 hours if headache persists or recurs. 02/26/12  Yes Newt Lukes, MD  fluticasone (FLONASE) 50 MCG/ACT nasal spray Place 2 sprays into the nose daily as needed. 08/28/12  Yes Newt Lukes, MD  folic acid (FOLVITE) 1 MG tablet Take 1 mg by mouth daily.     Yes Historical Provider, MD  guaiFENesin (MUCINEX) 600 MG 12 hr tablet Take 1,200 mg by mouth 2 (two) times daily as needed.    Yes Historical Provider, MD  isometheptene-acetaminophen-dichloralphenazone (MIDRIN) 65-325-100 MG per capsule Take 1 capsule by mouth at onset of headache; repeat in 2 hours. Max is 8 capsules per day    Yes Historical Provider, MD  meclizine (ANTIVERT) 25 MG tablet Take 25 mg by mouth every 6 (six) hours as needed.     Yes Historical Provider, MD  methotrexate (RHEUMATREX) 2.5 MG tablet Take 8 pills every Saturday. Caution:Chemotherapy. Protect from light.    Yes Historical Provider, MD  traMADol (ULTRAM) 50 MG tablet Take 50 mg by mouth every 6 (six) hours as needed.     Yes Historical Provider, MD  VOLTAREN 1 % GEL As needed   Yes Historical Provider, MD    Allergies  Allergen Reactions  . Sulfonamide Derivatives     REACTION: hives     Physical Exam:  General - No distress ENT - No sinus  tenderness, no oral exudate, no LAN Cardiac - s1s2 regular, no murmur Chest - No wheeze/rales/dullness Back - No focal tenderness Abd - Soft, non-tender Ext - No edema Neuro - Normal strength Skin - No rashes Psych - normal mood, and behavior   Assessment/Plan:  Jill Helling, MD [place] [facility]/Critical Care/Sleep Pager:  [phone]

## 2013-05-25 NOTE — Patient Instructions (Signed)
levaquin 500 mg pill >> one pill daily for 7 days Call if not feeling better Follow up in 6 months

## 2013-05-26 NOTE — Assessment & Plan Note (Signed)
She is to follow up with rheumatology to adjust her MTX. 

## 2013-05-26 NOTE — Assessment & Plan Note (Signed)
She may be getting mild flare.  Will give course of levaquin.  She is to continue Qvar and prn albuterol.

## 2013-06-11 ENCOUNTER — Other Ambulatory Visit: Payer: Self-pay

## 2013-08-27 ENCOUNTER — Encounter: Payer: Self-pay | Admitting: Internal Medicine

## 2013-08-27 ENCOUNTER — Ambulatory Visit (INDEPENDENT_AMBULATORY_CARE_PROVIDER_SITE_OTHER): Payer: Federal, State, Local not specified - PPO | Admitting: Internal Medicine

## 2013-08-27 VITALS — BP 114/70 | HR 73 | Temp 98.8°F | Ht 68.5 in | Wt 258.5 lb

## 2013-08-27 DIAGNOSIS — J479 Bronchiectasis, uncomplicated: Secondary | ICD-10-CM

## 2013-08-27 DIAGNOSIS — R221 Localized swelling, mass and lump, neck: Secondary | ICD-10-CM

## 2013-08-27 DIAGNOSIS — R220 Localized swelling, mass and lump, head: Secondary | ICD-10-CM

## 2013-08-27 NOTE — Patient Instructions (Signed)
Please continue all other medications as before, and refills have been done if requested.  Please have the pharmacy call with any other refills you may need.  You will be contacted regarding the referral for: thyroid (neck) ultrasound

## 2013-08-27 NOTE — Assessment & Plan Note (Signed)
Ok for neck u/s but cannot tell any > 1 cm at this time, consider ENT for abnormal u/s, hold on antibx at this time

## 2013-08-27 NOTE — Progress Notes (Signed)
Subjective:    Patient ID: Jill Harper, female    DOB: 06-11-1966, 48 y.o.   MRN: [id number]  HPI Here to f/u after seeing dentist originally nov 2014 with noting apparently submandib/neck lumps, then more recent f/u last wk with mention per dental suspicion of lumps larger, asked to f/u here. Pt had not noted any problem or lumbs, no pain, no recent URI, and  Pt denies fever, wt loss, night sweats, loss of appetite, or other constitutional symptoms  Has ongoing chronic cough related to bronchiectasis no change in prod, freq, severity.  No hemoptysis. 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 Past Medical History  Diagnosis Date  . Bronchiectasis        . Polymyositis     Dr [name]; chronic MTX  . MVP (mitral valve prolapse)   . Fibromyalgia   . Abnormal liver function test   . Menorrhagia   . OAB (overactive bladder)   . MIGRAINE HEADACHE    Past Surgical History  Procedure Laterality Date  . Cardiac catheterization  2002    normal  . Dilation and curettage of uterus  08-31-08    Dr [name]    reports that she has never smoked. She has never used smokeless tobacco. She reports that she does not drink alcohol or use illicit drugs. family history includes Diabetes in her mother; Fibromyalgia in her mother; Ulcers in her mother. Allergies  Allergen Reactions  . Sulfonamide Derivatives     REACTION: hives   Current Outpatient Prescriptions on File Prior to Visit  Medication Sig Dispense Refill  . albuterol (PROVENTIL HFA) 108 (90 BASE) MCG/ACT inhaler Inhale 2 puffs into the lungs 4 (four) times daily as needed.        [name] atenolol (TENORMIN) 25 MG tablet Take 0.5 tablets (12.5 mg total) by mouth daily as needed.  30 tablet  5  . beclomethasone (QVAR) 80 MCG/ACT inhaler Inhale 2 puffs into the lungs daily.      . cyclobenzaprine (FLEXERIL) 10 MG tablet Take 5 mg by mouth at bedtime as needed.        . eletriptan (RELPAX) 20 MG tablet One tablet by  mouth at onset of headache. May repeat in 2 hours if headache persists or recurs.  10 tablet  0  . fluticasone (FLONASE) 50 MCG/ACT nasal spray Place 2 sprays into the nose daily as needed.  16 g  2  . folic acid (FOLVITE) 1 MG tablet Take 1 mg by mouth daily.        [name] guaiFENesin (MUCINEX) 600 MG 12 hr tablet Take 1,200 mg by mouth 2 (two) times daily as needed.       . isometheptene-acetaminophen-dichloralphenazone (MIDRIN) 65-325-100 MG per capsule Take 1 capsule by mouth at onset of headache; repeat in 2 hours. Max is 8 capsules per day       . methotrexate (RHEUMATREX) 2.5 MG tablet Take 10 pills every Saturday. Caution:Chemotherapy. Protect from light.      . traMADol (ULTRAM) 50 MG tablet Take 50 mg by mouth every 6 (six) hours as needed.        . VOLTAREN 1 % GEL As needed      . cetirizine (ZYRTEC) 10 MG tablet Take 10 mg by mouth daily as needed.       No current facility-administered medications on file prior to visit.   Review of Systems  Constitutional: Negative for unexpected weight change, or unusual diaphoresis  HENT: Negative for tinnitus.   Eyes: Negative for photophobia and visual disturbance.  Respiratory: Negative for choking and stridor.   Gastrointestinal: Negative for vomiting and blood in stool.  Genitourinary: Negative for hematuria and decreased urine volume.  Musculoskeletal: Negative for acute joint swelling Skin: Negative for color change and wound.  Neurological: Negative for tremors and numbness other than noted  Psychiatric/Behavioral: Negative for decreased concentration or  hyperactivity.       Objective:   Physical Exam BP 114/70  Pulse 73  Temp(Src) 98.8 F (37.1 C) (Oral)  Ht 5' 8.5" (1.74 m)  Wt 258 lb 8 oz (117.255 kg)  BMI 38.73 kg/m2  SpO2 99% VS noted,  Constitutional: Pt appears well-developed and well-nourished.  HENT: Head: NCAT.  Right Ear: External ear normal.  Left Ear: External ear normal.  Eyes: Conjunctivae and EOM are  normal. Pupils are equal, round, and reactive to light.  Neck: Normal range of motion. Neck supple. Has several submandib prob Lymph nodes and right pre-scm as well, NT, none > 1 cm I can tell Cardiovascular: Normal rate and regular rhythm.   Pulmonary/Chest: Effort normal and breath sounds normal.  Abd:  Soft, NT, non-distended, + BS, no HSM Neurological: Pt is alert. Not confused  Skin: Skin is warm. No erythema.  Psychiatric: Pt behavior is normal. Thought content normal.     Assessment & Plan:

## 2013-08-27 NOTE — Assessment & Plan Note (Signed)
stable overall by history and exam,  and pt to continue medical treatment as before,  to f/u any worsening symptoms or concerns, d/w pt - very unlikely relate to any neck symptoms or lumps/LA

## 2013-08-27 NOTE — Progress Notes (Signed)
Pre-visit discussion using our clinic review tool. No additional management support is needed unless otherwise documented below in the visit note.  

## 2013-09-01 ENCOUNTER — Ambulatory Visit
Admission: RE | Admit: 2013-09-01 | Discharge: 2013-09-01 | Disposition: A | Discharge status: Home / Self Care | Payer: Federal, State, Local not specified - PPO | Source: Ambulatory Visit | Attending: Internal Medicine | Admitting: Internal Medicine

## 2013-09-01 ENCOUNTER — Other Ambulatory Visit: Payer: Self-pay | Admitting: Internal Medicine

## 2013-09-01 DIAGNOSIS — R221 Localized swelling, mass and lump, neck: Secondary | ICD-10-CM

## 2013-09-07 ENCOUNTER — Other Ambulatory Visit: Payer: Self-pay | Admitting: Otolaryngology

## 2013-09-07 DIAGNOSIS — D380 Neoplasm of uncertain behavior of larynx: Secondary | ICD-10-CM

## 2013-09-09 ENCOUNTER — Encounter: Payer: Self-pay | Admitting: Pulmonary Disease

## 2013-09-09 ENCOUNTER — Ambulatory Visit (INDEPENDENT_AMBULATORY_CARE_PROVIDER_SITE_OTHER): Payer: Federal, State, Local not specified - PPO | Admitting: Pulmonary Disease

## 2013-09-09 ENCOUNTER — Ambulatory Visit
Admission: RE | Admit: 2013-09-09 | Discharge: 2013-09-09 | Disposition: A | Discharge status: Home / Self Care | Payer: Federal, State, Local not specified - PPO | Source: Ambulatory Visit | Attending: Otolaryngology | Admitting: Otolaryngology

## 2013-09-09 VITALS — BP 112/82 | HR 76 | Ht 68.0 in | Wt 265.0 lb

## 2013-09-09 DIAGNOSIS — M3320 Polymyositis, organ involvement unspecified: Secondary | ICD-10-CM

## 2013-09-09 DIAGNOSIS — D380 Neoplasm of uncertain behavior of larynx: Secondary | ICD-10-CM

## 2013-09-09 DIAGNOSIS — J479 Bronchiectasis, uncomplicated: Secondary | ICD-10-CM

## 2013-09-09 MED ORDER — MOMETASONE FURO-FORMOTEROL FUM 100-5 MCG/ACT IN AERO: 2.0000 | INHALATION_SPRAY | Freq: Two times a day (BID) | RESPIRATORY_TRACT | Status: DC

## 2013-09-09 MED ORDER — IOHEXOL 300 MG/ML  SOLN
75.0000 mL | Freq: Once | INTRAMUSCULAR | Status: AC | PRN
Start: 2013-09-09 — End: 2013-09-09
  Administered 2013-09-09: 75 mL via INTRAVENOUS

## 2013-09-09 NOTE — Patient Instructions (Signed)
Try Dulera two puffs twice per day >> rinse mouth after each use. Call in two weeks to update status Can try over the counter mucinex and/or delsym to help with cough Follow up in 6 months

## 2013-09-09 NOTE — Progress Notes (Signed)
Chief Complaint  Patient presents with  . Recurrent Pneumonia    Coughing has been present for about 3 weeks. Non productive. Was given rx for Tessalon without relief.    CC: Jill Harper  History of Present Illness: Jill Harper is a 48 y.o. female with bronchiectasis in the setting of polymyositis.  She was treated with levaquin in October 2014 for bronchitis and this helped.  She continues to have cough.  She is not bringing up as much sputum, and the phlegm has a clearer appearance.  She has been using Robitussin DM at night.  She tried tessalon, but this did not help.  She denies fever, wheeze, or hemoptysis.  She does sometimes hear a rattle in her chest.  She increased Qvar to two puffs bid, but this has not helped.  She has not been using albuterol.  She was seen by Dr. Erik Obey with ENT recently for neck gland swelling, and had a CT neck earlier this month.  TESTS: CT chest 03/23/05 >> patchy b/l lower lung ASD with cylindrical BTX CT chest 02/17/08 >> peripheral and basilar predominant subpleural GGO CT chest 12/01/08 >> no change PFT 10/30/11 >> FEV1 3.31 (114%), FEV1% 85, TLC 6.06 (108%), DLCO 77%, no BD PFT 11/18/12 >> FEV1 3.18 (111%), FEV1% 84, TLC 5.34 (95%), DLCO 67%, no BD   Jill Harper  has a past medical history of Bronchiectasis; Polymyositis; MVP (mitral valve prolapse); Fibromyalgia; Abnormal liver function test; Menorrhagia; OAB (overactive bladder); and MIGRAINE HEADACHE.  Jill Harper  has past surgical history that includes Cardiac catheterization (2002) and Dilation and curettage of uterus (08-31-08).  Prior to Admission medications   Medication Sig Start Date End Date Taking? Authorizing Provider  albuterol (PROVENTIL HFA) 108 (90 BASE) MCG/ACT inhaler Inhale 2 puffs into the lungs 4 (four) times daily as needed.     Yes Historical Provider, MD  atenolol (TENORMIN) 25 MG tablet Take 0.5 tablets (12.5 mg total) by mouth daily as needed. 08/28/12  Yes Rowe Clack, MD  beclomethasone (QVAR) 80 MCG/ACT inhaler Inhale 2 puffs into the lungs daily. 10/10/11  Yes Chesley Mires, MD  cetirizine (ZYRTEC) 10 MG tablet Take 10 mg by mouth daily as needed. 12/11/11 12/10/12 Yes Rowe Clack, MD  cyclobenzaprine (FLEXERIL) 10 MG tablet Take 5 mg by mouth at bedtime as needed.     Yes Historical Provider, MD  eletriptan (RELPAX) 20 MG tablet One tablet by mouth at onset of headache. May repeat in 2 hours if headache persists or recurs. 02/26/12  Yes Rowe Clack, MD  fluticasone (FLONASE) 50 MCG/ACT nasal spray Place 2 sprays into the nose daily as needed. 08/28/12  Yes Rowe Clack, MD  folic acid (FOLVITE) 1 MG tablet Take 1 mg by mouth daily.     Yes Historical Provider, MD  guaiFENesin (MUCINEX) 600 MG 12 hr tablet Take 1,200 mg by mouth 2 (two) times daily as needed.    Yes Historical Provider, MD  isometheptene-acetaminophen-dichloralphenazone (MIDRIN) 65-325-100 MG per capsule Take 1 capsule by mouth at onset of headache; repeat in 2 hours. Max is 8 capsules per day    Yes Historical Provider, MD  meclizine (ANTIVERT) 25 MG tablet Take 25 mg by mouth every 6 (six) hours as needed.     Yes Historical Provider, MD  methotrexate (RHEUMATREX) 2.5 MG tablet Take 8 pills every Saturday. Caution:Chemotherapy. Protect from light.    Yes Historical Provider, MD  traMADol (ULTRAM) 50 MG tablet  Take 50 mg by mouth every 6 (six) hours as needed.     Yes Historical Provider, MD  VOLTAREN 1 % GEL As needed   Yes Historical Provider, MD    Allergies  Allergen Reactions  . Sulfonamide Derivatives     REACTION: hives     Physical Exam:  General - No distress ENT - No sinus tenderness, no oral exudate, no LAN Cardiac - s1s2 regular, no murmur Chest - No wheeze/rales/dullness Back - No focal tenderness Abd - Soft, non-tender Ext - No edema Neuro - Normal strength Skin - No rashes Psych - normal mood, and behavior   Assessment/Plan:  Chesley Mires, MD [facility] Pager:  [phone]

## 2013-09-10 NOTE — Assessment & Plan Note (Signed)
Will give her trial of dulera in place of Qvar.  She is to continue albuterol prn >> reviewed when she should consider using albuterol.  I don't think she needs additional antibiotics or chest xray at this time.

## 2013-09-10 NOTE — Assessment & Plan Note (Signed)
She is to follow up with rheumatology to adjust her MTX. 

## 2013-09-11 ENCOUNTER — Telehealth: Payer: Self-pay | Admitting: Pulmonary Disease

## 2013-09-11 MED ORDER — AZITHROMYCIN 250 MG PO TABS: ORAL_TABLET | ORAL | Status: DC

## 2013-09-11 NOTE — Telephone Encounter (Signed)
Pt advised and rx sent. Jennifer Castillo, CMA  

## 2013-09-11 NOTE — Telephone Encounter (Signed)
Ok to try zpak

## 2013-09-11 NOTE — Telephone Encounter (Signed)
lmomtcb x1 for pt 

## 2013-09-11 NOTE — Telephone Encounter (Signed)
Spoke with pt. She saw VS on 09/09/13. Pt report she was coughing then. This AM she woke up and once she coughed up a blood clot. Has not happened since and is not having a nose bleed. Pt reports last time this happened we palced her on ABX. She does not want to come in since she was just seen. Please advise MW as VS is not in thanks  Allergies  Allergen Reactions  . Sulfonamide Derivatives     REACTION: hives

## 2013-10-13 ENCOUNTER — Telehealth: Payer: Self-pay | Admitting: Pulmonary Disease

## 2013-10-13 MED ORDER — BECLOMETHASONE DIPROPIONATE 80 MCG/ACT IN AERS: 2.0000 | INHALATION_SPRAY | Freq: Every day | RESPIRATORY_TRACT | Status: DC

## 2013-10-13 NOTE — Telephone Encounter (Signed)
She can resume Qvar 80 mcg two puffs bid and stop dulera.

## 2013-10-13 NOTE — Telephone Encounter (Signed)
Pt is aware of VS's recs. She will need new rx for Qvar sent to her pharmacy. This has been done. Nothing further is needed.

## 2013-10-13 NOTE — Telephone Encounter (Signed)
Spoke with pt. At her last OV, VS started her on Dulera 100mg . 15-20 minutes after using Dulera she would get a migraine. She used the inhaler for several days and each time she used it, the same pain would come back. Was seen for a cough that she has had since 06/2013. Dulera did not help with cough. Would like to know how to proceed.  VS - please advise. Thanks.  *Pt is to follow up in 11/2013.*

## 2013-10-14 ENCOUNTER — Encounter (HOSPITAL_COMMUNITY): Payer: Self-pay | Admitting: Emergency Medicine

## 2013-10-14 ENCOUNTER — Emergency Department (HOSPITAL_COMMUNITY): Payer: Federal, State, Local not specified - PPO

## 2013-10-14 ENCOUNTER — Telehealth: Payer: Self-pay | Admitting: Internal Medicine

## 2013-10-14 ENCOUNTER — Emergency Department (HOSPITAL_COMMUNITY)
Admission: EM | Admit: 2013-10-14 | Discharge: 2013-10-14 | Disposition: A | Discharge status: Home / Self Care | Payer: Federal, State, Local not specified - PPO | Attending: Emergency Medicine | Admitting: Emergency Medicine

## 2013-10-14 DIAGNOSIS — R05 Cough: Secondary | ICD-10-CM

## 2013-10-14 DIAGNOSIS — R509 Fever, unspecified: Secondary | ICD-10-CM | POA: Insufficient documentation

## 2013-10-14 DIAGNOSIS — Z79899 Other long term (current) drug therapy: Secondary | ICD-10-CM | POA: Insufficient documentation

## 2013-10-14 DIAGNOSIS — Z8709 Personal history of other diseases of the respiratory system: Secondary | ICD-10-CM | POA: Insufficient documentation

## 2013-10-14 DIAGNOSIS — IMO0002 Reserved for concepts with insufficient information to code with codable children: Secondary | ICD-10-CM | POA: Insufficient documentation

## 2013-10-14 DIAGNOSIS — Z8679 Personal history of other diseases of the circulatory system: Secondary | ICD-10-CM | POA: Insufficient documentation

## 2013-10-14 DIAGNOSIS — J029 Acute pharyngitis, unspecified: Secondary | ICD-10-CM | POA: Insufficient documentation

## 2013-10-14 DIAGNOSIS — Z8612 Personal history of poliomyelitis: Secondary | ICD-10-CM | POA: Insufficient documentation

## 2013-10-14 DIAGNOSIS — R5381 Other malaise: Secondary | ICD-10-CM | POA: Insufficient documentation

## 2013-10-14 DIAGNOSIS — R059 Cough, unspecified: Secondary | ICD-10-CM | POA: Insufficient documentation

## 2013-10-14 DIAGNOSIS — J3489 Other specified disorders of nose and nasal sinuses: Secondary | ICD-10-CM | POA: Insufficient documentation

## 2013-10-14 DIAGNOSIS — R5383 Other fatigue: Secondary | ICD-10-CM

## 2013-10-14 MED ORDER — HYDROCOD POLST-CHLORPHEN POLST 10-8 MG/5ML PO LQCR
5.0000 mL | Freq: Once | ORAL | Status: AC
  Administered 2013-10-14: 5 mL via ORAL
  Filled 2013-10-14: qty 5

## 2013-10-14 MED ORDER — HYDROCOD POLST-CHLORPHEN POLST 10-8 MG/5ML PO LQCR: 5.0000 mL | Freq: Two times a day (BID) | ORAL | Status: DC | PRN

## 2013-10-14 NOTE — ED Provider Notes (Signed)
CSN: [id number]     Arrival date & time 10/14/13  1700 History  This chart was scribed for non-physician practitioner Charlann Lange working with Tanna Furry, MD by Mercy Moore, ED Scribe. This patient was seen in room WTR6/WTR6 and the patient's care was started at 5:30 PM.   Chief Complaint  Patient presents with  . Cough  . Nasal Congestion      The history is provided by the patient. No language interpreter was used.  HPI Comments: Jill Harper is a 48 y.o. female who presents to the Emergency Department complaining of fatigue, onset 5 days ago. Patient additionally reports sore throat, subjective fever, nasal congestion, dry croupy cough, difficulty breathing and low grade fever. Patient reports that Friday her symptoms presented. Saturday, chills presented. Monday night patient says she awakened due to her sore throat and her feeling as if she had been hit by a truck. Tuesday, rhinorrhea presented and has been clear, persistent drainage. Patient reports she called her PCP and was advised to come into the ED. While on the phone with Triage patient reports that her labored breathing was noted and she was advised to come in for a CXR to rule out pneumonia.   Patient shares that her cough has been present since November and that in October she coughed up blood and was prescribed Levaquin. She again  coughed up a blood clot in January or February for which she was given a Z pack. Patient reports visiting her pulmonologist throughout these instances and has not had any other episodes of hemoptysis.    Patient reports that she has not taken any medication, because she has been unsure of what exactly she is treating.  Past Medical History  Diagnosis Date  . Bronchiectasis        . Polymyositis     Dr [name]; chronic MTX  . MVP (mitral valve prolapse)   . Fibromyalgia   . Abnormal liver function test   . Menorrhagia   . OAB (overactive bladder)   . MIGRAINE HEADACHE    Past  Surgical History  Procedure Laterality Date  . Cardiac catheterization  2002    normal  . Dilation and curettage of uterus  08-31-08    Dr [name]   Family History  Problem Relation Age of Onset  . Diabetes Mother   . Fibromyalgia Mother   . Ulcers Mother    History  Substance Use Topics  . Smoking status: Never Smoker   . Smokeless tobacco: Never Used     Comment: Married, lives with spouse. 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 Alcohol Use: No   OB History   Grav Para Term Preterm Abortions TAB SAB Ect Mult Living                 Review of Systems  Constitutional: Positive for fever, chills and fatigue.  HENT: Positive for congestion, rhinorrhea and sore throat. Negative for ear pain.   Respiratory: Positive for cough.   Gastrointestinal: Negative for nausea, vomiting, abdominal pain, diarrhea and constipation.      Allergies  Sulfonamide derivatives  Home Medications   Current Outpatient Rx  Name  Route  Sig  Dispense  Refill  . albuterol (PROVENTIL HFA) 108 (90 BASE) MCG/ACT inhaler   Inhalation   Inhale 2 puffs into the lungs 4 (four) times daily as needed.           [name] atenolol (TENORMIN)  25 MG tablet   Oral   Take 0.5 tablets (12.5 mg total) by mouth daily as needed.   30 tablet   5   . azithromycin (ZITHROMAX) 250 MG tablet      As directed   6 each   0   . beclomethasone (QVAR) 80 MCG/ACT inhaler   Inhalation   Inhale 2 puffs into the lungs daily.   1 Inhaler   5   . cetirizine (ZYRTEC) 10 MG tablet   Oral   Take 10 mg by mouth daily as needed.         . cyclobenzaprine (FLEXERIL) 10 MG tablet   Oral   Take 5 mg by mouth at bedtime as needed.           . eletriptan (RELPAX) 20 MG tablet   Oral   One tablet by mouth at onset of headache. May repeat in 2 hours if headache persists or recurs.   10 tablet   0   . fluticasone (FLONASE) 50 MCG/ACT nasal spray   Nasal   Place 2 sprays into  the nose daily as needed.   16 g   2   . folic acid (FOLVITE) 1 MG tablet   Oral   Take 1 mg by mouth daily.           [name] guaiFENesin (MUCINEX) 600 MG 12 hr tablet   Oral   Take 1,200 mg by mouth 2 (two) times daily as needed.          . isometheptene-acetaminophen-dichloralphenazone (MIDRIN) 65-325-100 MG per capsule      Take 1 capsule by mouth at onset of headache; repeat in 2 hours. Max is 8 capsules per day          . methotrexate (RHEUMATREX) 2.5 MG tablet      Take 10 pills every Saturday. Caution:Chemotherapy. Protect from light.         . mometasone-formoterol (DULERA) 100-5 MCG/ACT AERO   Inhalation   Inhale 2 puffs into the lungs 2 (two) times daily.   1 Inhaler   5   . traMADol (ULTRAM) 50 MG tablet   Oral   Take 50 mg by mouth every 6 (six) hours as needed.           . VOLTAREN 1 % GEL      As needed          BP 158/77  Pulse 75  Temp(Src) 98.6 F (37 C) (Oral)  Resp 18  SpO2 100% Physical Exam  Nursing note and vitals reviewed. Constitutional: She is oriented to person, place, and time. She appears well-developed and well-nourished. No distress.  HENT:  Head: Normocephalic and atraumatic.  Eyes: EOM are normal.  Neck: Neck supple. No tracheal deviation present.  Cardiovascular: Normal rate, regular rhythm and normal heart sounds.   No murmur heard. Pulmonary/Chest: Effort normal and breath sounds normal. No respiratory distress. She has no wheezes. She has no rales.  Musculoskeletal: Normal range of motion.  Neurological: She is alert and oriented to person, place, and time.  Skin: Skin is warm and dry.  Psychiatric: She has a normal mood and affect. Her behavior is normal.    ED Course  Procedures (including critical care time) DIAGNOSTIC STUDIES: Oxygen Saturation is 100% on room air, normal by my interpretation.    COORDINATION OF CARE: 5:40 PM- Pt advised of plan for treatment and pt agrees.    Labs Review Labs Reviewed  - No data  to display Imaging Review No results found.   EKG Interpretation None      MDM   Final diagnoses:  None   1. Cough   Discussed with Dr. Jeneen Rinks. CXR negative. VSS in ED. She does not appear to be SOB and appears comfortable. Will give cough medication (Tussionex) and encourage follow up with [name] for recheck tomorrow. Patient is comfortable with discharge home.   I personally performed the services described in this documentation, which was scribed in my presence. The recorded information has been reviewed and is accurate.     Dewaine Oats, PA-C 10/17/13 [phone]

## 2013-10-14 NOTE — ED Notes (Signed)
Pt c/o fatigue since Friday, subjective fever, sore throat, nasal congestion, cough.  Hx of bronchiectasis.

## 2013-10-14 NOTE — Discharge Instructions (Signed)
Cough, Adult  A cough is a reflex. It helps you clear your throat and airways. A cough can help heal your body. A cough can last 2 or 3 weeks (acute) or may last more than 8 weeks (chronic). Some common causes of a cough can include an infection, allergy, or a cold. HOME CARE  Only take medicine as told by your doctor.  If given, take your medicines (antibiotics) as told. Finish them even if you start to feel better.  Use a cold steam vaporizer or humidier in your home. This can help loosen thick spit (secretions).  Sleep so you are almost sitting up (semi-upright). Use pillows to do this. This helps reduce coughing.  Rest as needed.  Stop smoking if you smoke. GET HELP RIGHT AWAY IF:  You have yellowish-white fluid (pus) in your thick spit.  Your cough gets worse.  Your medicine does not reduce coughing, and you are losing sleep.  You cough up blood.  You have trouble breathing.  Your pain gets worse and medicine does not help.  You have a fever. MAKE SURE YOU:   Understand these instructions.  Will watch your condition.  Will get help right away if you are not doing well or get worse. Document Released: 04/05/2011 Document Revised: 10/15/2011 Document Reviewed: 04/05/2011 Exit[employer] Patient Information 2014 [employer].  

## 2013-10-14 NOTE — Telephone Encounter (Signed)
Patient Information:  Caller Name: Juanisha  Phone: [phone]  Patient: Jill Harper, Jill Harper  Gender: Female  DOB: May 27, 1966  Age: 48 Years  PCP: Gwendolyn Grant (Adults only)  Pregnant: No  Office Follow Up:  Does the office need to follow up with this patient?: Yes  Instructions For The Office: disp is ED, but would like to be seen in the office or go downstairs for a CXR; please call back   Symptoms  Reason For Call & Symptoms: c/o possible flu sxs; constant clear runny nose; feels like she has been hit by a truck c/o cough and sore throat; c/o fatigue Friday and over the w/e with no fever; cold sxs started Monday; worsened 3/9;  Reviewed Health History In EMR: Yes  Reviewed Medications In EMR: Yes  Reviewed Allergies In EMR: Yes  Reviewed Surgeries / Procedures: Yes  Date of Onset of Symptoms: 10/09/2013  Treatments Tried: Qvar inhaler  Treatments Tried Worked: No  Any Fever: Yes  Fever Taken: Ear Thermometer  Fever Time Of Reading: 14:30:00  Fever Last Reading: 100 OB / GYN:  LMP: Unknown  Guideline(s) Used:  Colds  Cough  Disposition Per Guideline:   Go to ED Now  Reason For Disposition Reached:   Difficulty breathing  Advice Given:  N/A  RN Overrode Recommendation:  Follow Up With Office Later  would like to come in the office

## 2013-10-17 NOTE — ED Provider Notes (Signed)
Medical screening examination/treatment/procedure(s) were performed by non-physician practitioner and as supervising physician I was immediately available for consultation/collaboration.   EKG Interpretation None        Kimla Furth, MD 10/17/13 0721 

## 2013-10-30 ENCOUNTER — Encounter: Payer: Self-pay | Admitting: Pulmonary Disease

## 2013-10-30 ENCOUNTER — Ambulatory Visit (INDEPENDENT_AMBULATORY_CARE_PROVIDER_SITE_OTHER): Payer: Federal, State, Local not specified - PPO | Admitting: Pulmonary Disease

## 2013-10-30 VITALS — BP 148/84 | HR 71 | Temp 98.9°F | Ht 68.0 in | Wt 262.2 lb

## 2013-10-30 DIAGNOSIS — J479 Bronchiectasis, uncomplicated: Secondary | ICD-10-CM

## 2013-10-30 DIAGNOSIS — R0989 Other specified symptoms and signs involving the circulatory and respiratory systems: Secondary | ICD-10-CM

## 2013-10-30 DIAGNOSIS — R6889 Other general symptoms and signs: Secondary | ICD-10-CM

## 2013-10-30 NOTE — Patient Instructions (Signed)
Follow up in 6 months 

## 2013-10-30 NOTE — Progress Notes (Signed)
Chief Complaint  Patient presents with  . Follow-up    Pt states breathing has improved since last visit. C/o same cough since Nov., producing sputum is yellow. Pt feels chest soreness when coughing.     CC: Jill Harper  History of Present Illness: Jill Harper is a 48 y.o. female with bronchiectasis in the setting of polymyositis.  She thinks she had a respiratory virus for the past few weeks.  She had to go to the ER.  She was getting sinus drainage and low grade temperature.  She also felt achy and tired.  With this her cough was worse with more sputum production.  She was given tussionex from ER, but was not given antibiotics or prednisone.  She is much better now.  She tried dulera after her last visit, but this caused terrible headaches.  She has since changed to Qvar, and this helps.  She is not needing to use albuterol as much.  She still gets cough from post-nasal drip.  She had CXR on 10/14/13 which did not show any new findings.  TESTS: CT chest 03/23/05 >> patchy b/l lower lung ASD with cylindrical BTX CT chest 02/17/08 >> peripheral and basilar predominant subpleural GGO CT chest 12/01/08 >> no change PFT 10/30/11 >> FEV1 3.31 (114%), FEV1% 85, TLC 6.06 (108%), DLCO 77%, no BD PFT 11/18/12 >> FEV1 3.18 (111%), FEV1% 84, TLC 5.34 (95%), DLCO 67%, no BD   Jill Harper  has a past medical history of Bronchiectasis; Polymyositis; MVP (mitral valve prolapse); Fibromyalgia; Abnormal liver function test; Menorrhagia; OAB (overactive bladder); and MIGRAINE HEADACHE.  Jill Harper  has past surgical history that includes Cardiac catheterization (2002) and Dilation and curettage of uterus (08-31-08).  Prior to Admission medications   Medication Sig Start Date End Date Taking? Authorizing Provider  albuterol (PROVENTIL HFA) 108 (90 BASE) MCG/ACT inhaler Inhale 2 puffs into the lungs 4 (four) times daily as needed.     Yes Historical Provider, MD  atenolol (TENORMIN) 25 MG tablet Take  0.5 tablets (12.5 mg total) by mouth daily as needed. 08/28/12  Yes Rowe Clack, MD  beclomethasone (QVAR) 80 MCG/ACT inhaler Inhale 2 puffs into the lungs daily. 10/10/11  Yes Chesley Mires, MD  cetirizine (ZYRTEC) 10 MG tablet Take 10 mg by mouth daily as needed. 12/11/11 12/10/12 Yes Rowe Clack, MD  cyclobenzaprine (FLEXERIL) 10 MG tablet Take 5 mg by mouth at bedtime as needed.     Yes Historical Provider, MD  eletriptan (RELPAX) 20 MG tablet One tablet by mouth at onset of headache. May repeat in 2 hours if headache persists or recurs. 02/26/12  Yes Rowe Clack, MD  fluticasone (FLONASE) 50 MCG/ACT nasal spray Place 2 sprays into the nose daily as needed. 08/28/12  Yes Rowe Clack, MD  folic acid (FOLVITE) 1 MG tablet Take 1 mg by mouth daily.     Yes Historical Provider, MD  guaiFENesin (MUCINEX) 600 MG 12 hr tablet Take 1,200 mg by mouth 2 (two) times daily as needed.    Yes Historical Provider, MD  isometheptene-acetaminophen-dichloralphenazone (MIDRIN) 65-325-100 MG per capsule Take 1 capsule by mouth at onset of headache; repeat in 2 hours. Max is 8 capsules per day    Yes Historical Provider, MD  meclizine (ANTIVERT) 25 MG tablet Take 25 mg by mouth every 6 (six) hours as needed.     Yes Historical Provider, MD  methotrexate (RHEUMATREX) 2.5 MG tablet Take 8 pills every Saturday. Caution:Chemotherapy.  Protect from light.    Yes Historical Provider, MD  traMADol (ULTRAM) 50 MG tablet Take 50 mg by mouth every 6 (six) hours as needed.     Yes Historical Provider, MD  VOLTAREN 1 % GEL As needed   Yes Historical Provider, MD    Allergies  Allergen Reactions  . Sulfonamide Derivatives     REACTION: hives     Physical Exam:  General - No distress ENT - No sinus tenderness, no oral exudate, no LAN Cardiac - s1s2 regular, no murmur Chest - No wheeze/rales/dullness Back - No focal tenderness Abd - Soft, non-tender Ext - No edema Neuro - Normal strength Skin - No  rashes Psych - normal mood, and behavior   Dg Chest 2 View  10/14/2013   CLINICAL DATA Cough and nasal congestion  EXAM CHEST  2 VIEW  COMPARISON 11/05/2012  FINDINGS Moderate interstitial coarsening at the bases, which is not progressed from priors. No fibrotic change. No acute infiltrate, edema, effusion, or pneumothorax. Normal heart size and mediastinal contours.  IMPRESSION Negative for pneumonia.  SIGNATURE  Electronically Signed   By: Jorje Guild M.D.   On: 10/14/2013 18:16    Assessment/Plan:  Chesley Mires, MD [facility]/Sleep Pager:  [phone]

## 2013-11-04 NOTE — Assessment & Plan Note (Signed)
She is to continue Qvar and prn albuterol.

## 2013-11-04 NOTE — Assessment & Plan Note (Signed)
Current symptoms likely related to recent upper respiratory infection with persistent post-nasal drip.  Advised her to call back if this persists.

## 2014-04-20 ENCOUNTER — Ambulatory Visit (INDEPENDENT_AMBULATORY_CARE_PROVIDER_SITE_OTHER): Payer: Federal, State, Local not specified - PPO | Admitting: Pulmonary Disease

## 2014-04-20 ENCOUNTER — Encounter: Payer: Self-pay | Admitting: Pulmonary Disease

## 2014-04-20 VITALS — BP 112/70 | HR 66 | Temp 97.8°F | Ht 68.0 in | Wt 264.4 lb

## 2014-04-20 DIAGNOSIS — Z23 Encounter for immunization: Secondary | ICD-10-CM

## 2014-04-20 DIAGNOSIS — M3320 Polymyositis, organ involvement unspecified: Secondary | ICD-10-CM

## 2014-04-20 DIAGNOSIS — R0989 Other specified symptoms and signs involving the circulatory and respiratory systems: Secondary | ICD-10-CM

## 2014-04-20 DIAGNOSIS — R6889 Other general symptoms and signs: Secondary | ICD-10-CM

## 2014-04-20 DIAGNOSIS — J479 Bronchiectasis, uncomplicated: Secondary | ICD-10-CM

## 2014-04-20 NOTE — Progress Notes (Signed)
Chief Complaint  Patient presents with  . Follow-up    Pt denies any breathing issues since last OV. Using QVAR qd and albuterol prn.     CC: [name]  History of Present Illness: ELANIE HAMMITT is a 48 y.o. female with bronchiectasis in the setting of polymyositis.  Her cough and throat clearing have improved.  She notices this more in the morning.  She also notices more cough around cool air from a fan and from dust exposure.  She is using Qvar once daily.  She has not needed to use proventil.  She denies chest pain, fever, skin rash, or wheeze.  TESTS: CT chest 03/23/05 >> patchy b/l lower lung ASD with cylindrical BTX CT chest 02/17/08 >> peripheral and basilar predominant subpleural GGO CT chest 12/01/08 >> no change PFT 10/30/11 >> FEV1 3.31 (114%), FEV1% 85, TLC 6.06 (108%), DLCO 77%, no BD PFT 11/18/12 >> FEV1 3.18 (111%), FEV1% 84, TLC 5.34 (95%), DLCO 67%, no BD  PMHx, PSHx, Medications, Allergies, Fhx, Shx reviewed.  Physical Exam:  General - No distress ENT - No sinus tenderness, no oral exudate, no LAN Cardiac - s1s2 regular, no murmur Chest - No wheeze/rales/dullness Back - No focal tenderness Abd - Soft, non-tender Ext - No edema Neuro - Normal strength Skin - No rashes Psych - normal mood, and behavior   Assessment/Plan:  Chesley Mires, MD [facility]/Critical [facility]/Sleep Pager:  [phone]

## 2014-04-20 NOTE — Assessment & Plan Note (Signed)
She is to follow up with rheumatology to adjust her MTX.

## 2014-04-20 NOTE — Assessment & Plan Note (Signed)
Likely related to allergies ?from dust mite exposure.  Improved since last visit.  Discussed environmental control techniques.

## 2014-04-20 NOTE — Assessment & Plan Note (Signed)
She is to continue Qvar daily and prn albuterol.

## 2014-04-20 NOTE — Patient Instructions (Signed)
Flu shot today Follow up in 6 months 

## 2014-09-15 ENCOUNTER — Ambulatory Visit (INDEPENDENT_AMBULATORY_CARE_PROVIDER_SITE_OTHER): Payer: Federal, State, Local not specified - PPO | Admitting: Pulmonary Disease

## 2014-09-15 ENCOUNTER — Encounter: Payer: Self-pay | Admitting: Pulmonary Disease

## 2014-09-15 VITALS — BP 124/76 | HR 67 | Temp 97.0°F | Ht 68.0 in | Wt 267.6 lb

## 2014-09-15 DIAGNOSIS — J471 Bronchiectasis with (acute) exacerbation: Secondary | ICD-10-CM

## 2014-09-15 MED ORDER — LEVOFLOXACIN 750 MG PO TABS
750.0000 mg | ORAL_TABLET | Freq: Every day | ORAL | Status: DC
Start: 2014-09-15 — End: 2014-10-27

## 2014-09-15 NOTE — Patient Instructions (Signed)
Will treat with levaquin 750mg  one a day for 7 days for your flare. Drink plenty of fluids, and can try mucinex to see if helps. Keep your followup with [name], but call us if not getting better.

## 2014-09-15 NOTE — Progress Notes (Signed)
   Subjective:    Patient ID: Jill Harper, female    DOB: 1966-03-30, 49 y.o.   MRN: [id number]  HPI The patient comes in today for an acute sick visit. She has known bronchiectasis, and gives a one-week history of increasing chest congestion with cough of purulent mucus. She has seen an increase in the quantity of mucus and change in character over her usual baseline. It started out with a runny nose and sore throat, but it has persisted and is even getting worse. This makes a viral infection less likely. It should be noted the patient is on methotrexate.   Review of Systems  Constitutional: Negative for fever and unexpected weight change.  HENT: Positive for congestion, postnasal drip and rhinorrhea. Negative for dental problem, ear pain, nosebleeds, sinus pressure, sneezing, sore throat and trouble swallowing.   Eyes: Negative for redness and itching.  Respiratory: Positive for cough. Negative for chest tightness, shortness of breath and wheezing.   Cardiovascular: Negative for palpitations and leg swelling.  Gastrointestinal: Negative for nausea and vomiting.  Genitourinary: Negative for dysuria.  Musculoskeletal: Negative for joint swelling.  Skin: Negative for rash.  Neurological: Negative for headaches.  Hematological: Does not bruise/bleed easily.  Psychiatric/Behavioral: Negative for dysphoric mood. The patient is not nervous/anxious.        Objective:   Physical Exam Overweight female in no acute distress Nose without purulence or discharge noted Neck without lymphadenopathy or thyromegaly Chest with a few scattered crackles, no wheezing Cardiac exam with regular rate and rhythm Lower extremities with minimal edema, no cyanosis Alert and oriented, moves all 4 extremities.       Assessment & Plan:

## 2014-09-15 NOTE — Progress Notes (Signed)
Reviewed and agree with assessment/plan. 

## 2014-09-15 NOTE — Assessment & Plan Note (Signed)
The patient is describing increasing chest congestion for the last week with a significant change in the color and quantity of her mucus. She is also had some low-grade fever. Her history is most consistent with a bronchiectasis flare, and we'll therefore treat her with an antibiotic. I would prefer something more broad-spectrum given her history of bronchiectasis and the fact that she is on immunosuppressive agents.

## 2014-10-12 ENCOUNTER — Other Ambulatory Visit: Payer: Self-pay | Admitting: Otolaryngology

## 2014-10-12 DIAGNOSIS — D380 Neoplasm of uncertain behavior of larynx: Secondary | ICD-10-CM

## 2014-10-15 ENCOUNTER — Ambulatory Visit
Admission: RE | Admit: 2014-10-15 | Discharge: 2014-10-15 | Disposition: A | Discharge status: Home / Self Care | Payer: Federal, State, Local not specified - PPO | Source: Ambulatory Visit | Attending: Otolaryngology | Admitting: Otolaryngology

## 2014-10-15 DIAGNOSIS — D380 Neoplasm of uncertain behavior of larynx: Secondary | ICD-10-CM

## 2014-10-27 ENCOUNTER — Ambulatory Visit (INDEPENDENT_AMBULATORY_CARE_PROVIDER_SITE_OTHER): Payer: Federal, State, Local not specified - PPO | Admitting: Pulmonary Disease

## 2014-10-27 ENCOUNTER — Encounter: Payer: Self-pay | Admitting: Pulmonary Disease

## 2014-10-27 VITALS — BP 110/72 | HR 70 | Ht 68.0 in | Wt 271.4 lb

## 2014-10-27 DIAGNOSIS — M3320 Polymyositis, organ involvement unspecified: Secondary | ICD-10-CM

## 2014-10-27 DIAGNOSIS — R198 Other specified symptoms and signs involving the digestive system and abdomen: Secondary | ICD-10-CM

## 2014-10-27 DIAGNOSIS — J479 Bronchiectasis, uncomplicated: Secondary | ICD-10-CM | POA: Diagnosis not present

## 2014-10-27 DIAGNOSIS — R6889 Other general symptoms and signs: Secondary | ICD-10-CM

## 2014-10-27 DIAGNOSIS — R0989 Other specified symptoms and signs involving the circulatory and respiratory systems: Secondary | ICD-10-CM

## 2014-10-27 NOTE — Patient Instructions (Signed)
Can try nasacort if sinus congestion gets worse Can try prilosec 20 mg if you have more trouble with reflux Sip water when you have urge to cough Follow up in 6 months

## 2014-10-27 NOTE — Progress Notes (Signed)
Chief Complaint  Patient presents with  . Follow-up    Pt reports no change since last seen - reports good and bad days. Pt states that she is still coughing up normal mucus with some discoloration but no blood.    CC: Jill Harper  History of Present Illness: Jill Harper is a 49 y.o. female with bronchiectasis in the setting of polymyositis.  She was seen by Dr. Gwenette Greet in February for BTX exacerbation.  She has been doing better since.  She still has episodes of cough.  She usually feels like something is stuck in her throat.  She will clear her throat.  She sometimes brings up clear sputum.  She denies chest pain, hemoptysis, fever, or wheeze.  TESTS: CT chest 03/23/05 >> patchy b/l lower lung ASD with cylindrical BTX CT chest 02/17/08 >> peripheral and basilar predominant subpleural GGO CT chest 12/01/08 >> no change PFT 10/30/11 >> FEV1 3.31 (114%), FEV1% 85, TLC 6.06 (108%), DLCO 77%, no BD PFT 11/18/12 >> FEV1 3.18 (111%), FEV1% 84, TLC 5.34 (95%), DLCO 67%, no BD  PMHx >> MVP, HA  PSHx, Medications, Allergies, Fhx, Shx reviewed.  Physical Exam: Blood pressure 110/72, pulse 70, height 5\' 8"  (1.727 m), weight 271 lb 6.4 oz (123.106 kg), SpO2 97 %. Body mass index is 41.28 kg/(m^2).  General - No distress ENT - No sinus tenderness, no oral exudate, no LAN Cardiac - s1s2 regular, no murmur Chest - No wheeze/rales/dullness Back - No focal tenderness Abd - Soft, non-tender Ext - No edema Neuro - Normal strength Skin - No rashes Psych - normal mood, and behavior  Assessment/Plan:  Bronchiectasis in setting of polymyositis. Plan: - continue Qvar and prn albuterol  Throat clearing and cough. Plan: - advised her to sip water when she has urge to cough - she can try OTC nasacort if her post-nasal drip gets worse - she can try OTC prilosec if she has more trouble with reflux   Chesley Mires, MD [facility]/Critical Care/Sleep Pager:  [phone]

## 2014-11-26 ENCOUNTER — Other Ambulatory Visit: Payer: Self-pay | Admitting: Pulmonary Disease

## 2015-02-15 ENCOUNTER — Other Ambulatory Visit: Payer: Self-pay | Admitting: Pulmonary Disease

## 2015-04-15 ENCOUNTER — Other Ambulatory Visit: Payer: Self-pay | Admitting: Pulmonary Disease

## 2015-05-26 ENCOUNTER — Ambulatory Visit (INDEPENDENT_AMBULATORY_CARE_PROVIDER_SITE_OTHER): Payer: Federal, State, Local not specified - PPO | Admitting: Adult Health

## 2015-05-26 ENCOUNTER — Encounter: Payer: Self-pay | Admitting: Adult Health

## 2015-05-26 VITALS — BP 134/84 | HR 66 | Temp 98.3°F | Ht 68.0 in | Wt 268.0 lb

## 2015-05-26 DIAGNOSIS — M3320 Polymyositis, organ involvement unspecified: Secondary | ICD-10-CM | POA: Diagnosis not present

## 2015-05-26 DIAGNOSIS — Z23 Encounter for immunization: Secondary | ICD-10-CM

## 2015-05-26 NOTE — Progress Notes (Signed)
   Subjective:    Patient ID: IYAUNA SING, female    DOB: 06/16/1966, 49 y.o.   MRN: [id number]  HPI 49 yo female with Bronchiectasis in setting of polymyositis   05/26/2015 Follow up : Bronchiectasis Returns for 6 month follow up . Doing well overall.  Feels her breathing is doing well with no flare of coughing or dyspnea.  No flare >3month , no abx >6 month .  Needs flu shot today.  Remains on Methotrexate , fby Rheumatology    TEST  CT chest 03/23/05 >> patchy b/l lower lung ASD with cylindrical BTX CT chest 02/17/08 >> peripheral and basilar predominant subpleural GGO CT chest 12/01/08 >> no change PFT 10/30/11 >> FEV1 3.31 (114%), FEV1% 85, TLC 6.06 (108%), DLCO 77%, no BD PFT 11/18/12 >> FEV1 3.18 (111%), FEV1% 84, TLC 5.34 (95%), DLCO 67%, no BD   Review of Systems Constitutional:   No  weight loss, night sweats,  Fevers, chills, fatigue, or  lassitude.  HEENT:   No headaches,  Difficulty swallowing,  Tooth/dental problems, or  Sore throat,                No sneezing, itching, ear ache, nasal congestion, post nasal drip,   CV:  No chest pain,  Orthopnea, PND, swelling in lower extremities, anasarca, dizziness, palpitations, syncope.   GI  No heartburn, indigestion, abdominal pain, nausea, vomiting, diarrhea, change in bowel habits, loss of appetite, bloody stools.   Resp: No shortness of breath with exertion or at rest.  No excess mucus, no productive cough,  No non-productive cough,  No coughing up of blood.  No change in color of mucus.  No wheezing.  No chest wall deformity  Skin: no rash or lesions.  GU: no dysuria, change in color of urine, no urgency or frequency.  No flank pain, no hematuria   MS:  No joint pain or swelling.  No decreased range of motion.  No back pain.  Psych:  No change in mood or affect. No depression or anxiety.  No memory loss.         Objective:   Physical Exam  GEN: A/Ox3; pleasant , NAD,obese   HEENT:  [place]/AT,  EACs-clear, TMs-wnl,  NOSE-clear, THROAT-clear, no lesions, no postnasal drip or exudate noted.   NECK:  Supple w/ fair ROM; no JVD; normal carotid impulses w/o bruits; no thyromegaly or nodules palpated; no lymphadenopathy.  RESP  Clear  P & A; w/o, wheezes/ rales/ or rhonchi.no accessory muscle use, no dullness to percussion  CARD:  RRR, no m/r/g  , no peripheral edema, pulses intact, no cyanosis or clubbing.  GI:   Soft & nt; nml bowel sounds; no organomegaly or masses detected.  Musco: Warm bil, no deformities or joint swelling noted.   Neuro: alert, no focal deficits noted.    Skin: Warm, no lesions or rashes        Assessment & Plan:

## 2015-05-26 NOTE — Progress Notes (Signed)
Reviewed and agree with assessment/plan. 

## 2015-05-26 NOTE — Patient Instructions (Signed)
Flu shot today Follow up in 6 months with [name]  .

## 2015-05-26 NOTE — Assessment & Plan Note (Signed)
Doing well without flare   Plan  Flu shot today Follow up in 6 months with [name]  .

## 2015-05-26 NOTE — Assessment & Plan Note (Signed)
Doing well with no flare   Plan  follow up with Rheumatology as planned

## 2015-07-12 ENCOUNTER — Encounter: Payer: Self-pay | Admitting: Family

## 2015-07-12 ENCOUNTER — Ambulatory Visit (INDEPENDENT_AMBULATORY_CARE_PROVIDER_SITE_OTHER): Payer: Federal, State, Local not specified - PPO | Admitting: Family

## 2015-07-12 VITALS — BP 130/90 | HR 87 | Temp 98.4°F | Resp 16 | Ht 68.0 in | Wt 261.0 lb

## 2015-07-12 DIAGNOSIS — J069 Acute upper respiratory infection, unspecified: Secondary | ICD-10-CM | POA: Diagnosis not present

## 2015-07-12 MED ORDER — LEVOFLOXACIN 500 MG PO TABS: 500.0000 mg | ORAL_TABLET | Freq: Every day | ORAL | Status: DC

## 2015-07-12 MED ORDER — FLUCONAZOLE 150 MG PO TABS: 150.0000 mg | ORAL_TABLET | Freq: Once | ORAL | Status: DC

## 2015-07-12 NOTE — Progress Notes (Signed)
Subjective:    Patient ID: MERRIAM REVEAL, female    DOB: 1966/03/26, 49 y.o.   MRN: [id number]  Chief Complaint  Patient presents with  . Nasal Congestion    a week and a half, congestion, no appetite, body aches, cough, low grade fever    HPI:  SERINA JAMESON is a 49 y.o. female who  has a past medical history of Bronchiectasis; Polymyositis ([place]); MVP (mitral valve prolapse); Fibromyalgia; Abnormal liver function test; Menorrhagia; OAB (overactive bladder); and MIGRAINE HEADACHE. and presents today for an acute office visit.  This is a new problem. Associated symptoms of congestion, decreased appetite, body aches, cough, and low-grade fever going on for approximately a week and a half. Modifying factors include Tylenol multi-symptom which did not help very much. Notes that she was starting to feel better however got worse the last several days.  T-max was about 100 and the last fever was 2 days ago. Denies any recent antibiotics.    Allergies  Allergen Reactions  . Sulfonamide Derivatives     REACTION: hives     Current Outpatient Prescriptions on File Prior to Visit  Medication Sig Dispense Refill  . albuterol (PROVENTIL HFA) 108 (90 BASE) MCG/ACT inhaler Inhale 2 puffs into the lungs 4 (four) times daily as needed.      [name] atenolol (TENORMIN) 25 MG tablet Take 12.5 mg by mouth daily as needed (for symptoms of mitral prolapse).    . beclomethasone (QVAR) 80 MCG/ACT inhaler Inhale 2 puffs into the lungs daily.     . cyclobenzaprine (FLEXERIL) 10 MG tablet Take 5 mg by mouth at bedtime as needed.      . eletriptan (RELPAX) 20 MG tablet One tablet by mouth at onset of headache. May repeat in 2 hours if headache persists or recurs. 10 tablet 0  . folic acid (FOLVITE) 1 MG tablet Take 1 mg by mouth daily.      . methotrexate (RHEUMATREX) 2.5 MG tablet Take 10 pills every Saturday. Caution:Chemotherapy. Protect from light.    [name] QVAR 80 MCG/ACT inhaler INHALE 2 PUFFS INTO THE LUNGS EVERY  DAY 8.7 g 4  . traMADol (ULTRAM) 50 MG tablet Take 50 mg by mouth every 6 (six) hours as needed for moderate pain.     [name] VOLTAREN 1 % GEL Apply 2 g topically as needed (pain).      No current facility-administered medications on file prior to visit.    Review of Systems  Constitutional: Positive for fever and fatigue. Negative for chills.  HENT: Positive for congestion. Negative for sinus pressure and sore throat.   Respiratory: Positive for cough. Negative for chest tightness and shortness of breath.   Cardiovascular: Negative for chest pain.  Neurological: Positive for headaches.      Objective:    BP 130/90 mmHg  Pulse 87  Temp(Src) 98.4 F (36.9 C) (Oral)  Resp 16  Ht 5\' 8"  (1.727 m)  Wt 261 lb (118.389 kg)  BMI 39.69 kg/m2  SpO2 98% Nursing note and vital signs reviewed.  Physical Exam  Constitutional: She is oriented to person, place, and time. She appears well-developed and well-nourished. No distress.  HENT:  Right Ear: Hearing, tympanic membrane, external ear and ear canal normal.  Left Ear: Hearing, tympanic membrane, external ear and ear canal normal.  Nose: Nose normal.  Mouth/Throat: Uvula is midline, oropharynx is clear and moist and mucous membranes are normal.  Eyes: Conjunctivae are normal. Pupils are equal, round, and reactive  to light.  Cardiovascular: Normal rate, regular rhythm, normal heart sounds and intact distal pulses.   Pulmonary/Chest: Effort normal and breath sounds normal. She has no wheezes. She has no rales.  Neurological: She is alert and oriented to person, place, and time.  Skin: Skin is warm and dry.  Psychiatric: She has a normal mood and affect. Her behavior is normal. Judgment and thought content normal.       Assessment & Plan:   Problem List Items Addressed This Visit      Respiratory   Acute upper respiratory infection - Primary    Symptoms and exam consistent with a bacterial upper respiratory infection. Start levofloxacin.  Start fluconazole as needed for post-antibiotic candidiasis. Continue over the counter medications as needed for symptom relief and supportive care. Follow up if symptoms worsen or fail to improve.       Relevant Medications   levofloxacin (LEVAQUIN) 500 MG tablet   fluconazole (DIFLUCAN) 150 MG tablet

## 2015-07-12 NOTE — Patient Instructions (Addendum)
Thank you for choosing [facility].  Summary/Instructions:  Your prescription(s) have been submitted to your pharmacy or been printed and provided for you. Please take as directed and contact our office if you believe you are having problem(s) with the medication(s) or have any questions.  If your symptoms worsen or fail to improve, please contact our office for further instruction, or in case of emergency go directly to the emergency room at the closest medical facility.   General Recommendations:    Please drink plenty of fluids.  Get plenty of rest   Sleep in humidified air  Use saline nasal sprays  Netti pot   OTC Medications:  Decongestants - helps relieve congestion   Flonase (generic fluticasone) or Nasacort (generic triamcinolone) - please make sure to use the "cross-over" technique at a 45 degree angle towards the opposite eye as opposed to straight up the nasal passageway.   Sudafed (generic pseudoephedrine - Note this is the one that is available behind the pharmacy counter); Products with phenylephrine (-PE) may also be used but is often not as effective as pseudoephedrine.   If you have HIGH BLOOD PRESSURE - Coricidin HBP; AVOID any product that is -D as this contains pseudoephedrine which may increase your blood pressure.  Afrin (oxymetazoline) every 6-8 hours for up to 3 days.   Allergies - helps relieve runny nose, itchy eyes and sneezing   Claritin (generic loratidine), Allegra (fexofenidine), or Zyrtec (generic cyrterizine) for runny nose. These medications should not cause drowsiness.  Note - Benadryl (generic diphenhydramine) may be used however may cause drowsiness  Cough -   Delsym or Robitussin (generic dextromethorphan)  Expectorants - helps loosen mucus to ease removal   Mucinex (generic guaifenesin) as directed on the package.  Headaches / General Aches   Tylenol (generic acetaminophen) - DO NOT EXCEED 3 grams (3,000 mg) in a 24  hour time period  Advil/Motrin (generic ibuprofen)   Sore Throat -   Salt water gargle   Chloraseptic (generic benzocaine) spray or lozenges / Sucrets (generic dyclonine)      

## 2015-07-12 NOTE — Assessment & Plan Note (Signed)
Symptoms and exam consistent with a bacterial upper respiratory infection. Start levofloxacin. Start fluconazole as needed for post-antibiotic candidiasis. Continue over the counter medications as needed for symptom relief and supportive care. Follow up if symptoms worsen or fail to improve.

## 2015-07-12 NOTE — Progress Notes (Signed)
Pre visit review using our clinic review tool, if applicable. No additional management support is needed unless otherwise documented below in the visit note. 

## 2015-07-18 ENCOUNTER — Encounter: Payer: Self-pay | Admitting: Family

## 2015-07-18 ENCOUNTER — Ambulatory Visit (INDEPENDENT_AMBULATORY_CARE_PROVIDER_SITE_OTHER)
Admission: RE | Admit: 2015-07-18 | Discharge: 2015-07-18 | Disposition: A | Discharge status: Home / Self Care | Payer: Federal, State, Local not specified - PPO | Source: Ambulatory Visit | Attending: Family | Admitting: Family

## 2015-07-18 ENCOUNTER — Ambulatory Visit (INDEPENDENT_AMBULATORY_CARE_PROVIDER_SITE_OTHER): Payer: Federal, State, Local not specified - PPO | Admitting: Family

## 2015-07-18 VITALS — BP 118/80 | HR 73 | Temp 98.3°F | Resp 14 | Ht 68.0 in | Wt 261.8 lb

## 2015-07-18 DIAGNOSIS — R05 Cough: Secondary | ICD-10-CM

## 2015-07-18 DIAGNOSIS — R059 Cough, unspecified: Secondary | ICD-10-CM | POA: Insufficient documentation

## 2015-07-18 MED ORDER — AZITHROMYCIN 250 MG PO TABS: ORAL_TABLET | ORAL | Status: DC

## 2015-07-18 NOTE — Assessment & Plan Note (Signed)
Continues to experience symptoms or cough with one day remaining of her level levofloxacin. Describes extreme fatigue and chest discomfort with cough. Obtain chest x-ray to rule out underlying pathology, however cannot rule out bronchiectasis. Start azithromycin if symptoms do not improve following completion of levofloxacin. Follow-up with pulmonology if no resolution is obtained after azithromycin.

## 2015-07-18 NOTE — Patient Instructions (Signed)
Thank you for choosing [facility].  Summary/Instructions:  Your prescription(s) have been submitted to your pharmacy or been printed and provided for you. Please take as directed and contact our office if you believe you are having problem(s) with the medication(s) or have any questions.  Please stop by radiology on the basement level of the building for your x-rays. Your results will be released to MyChart (or called to you) after review, usually within 72 hours after test completion. If any treatments or changes are necessary, you will be notified at that same time.  If your symptoms worsen or fail to improve, please contact our office for further instruction, or in case of emergency go directly to the emergency room at the closest medical facility.     

## 2015-07-18 NOTE — Progress Notes (Signed)
Subjective:    Patient ID: Jill Harper, female    DOB: 12/03/1965, 49 y.o.   MRN: [id number]  Chief Complaint  Patient presents with  . Fatigue    the antibiotic that was given to her last visit has helped some sxs but now she is extremely fatigue, chills, cough that hurts chest and back, little appetite, feels like she needs another antibiotic    HPI:  Jill Harper is a 49 y.o. female who  has a past medical history of Bronchiectasis; Polymyositis ([place]); MVP (mitral valve prolapse); Fibromyalgia; Abnormal liver function test; Menorrhagia; OAB (overactive bladder); and MIGRAINE HEADACHE. and presents today for a follow up office visit.   Recently seen in the office and diagnosed with acute upper respiratory infection and treated with a course of levofloxacin. Takes the antibiotic as prescribed and denies adverse side effects. Notes that the antibiotic helped a little, however continues to experience fatigue, chills, cough, and decreased appetite. Denies fevers. Describes cough as not as productive, but feels it in her chest more. Concern for exacerbation of the bronchiectasis.   Allergies  Allergen Reactions  . Sulfonamide Derivatives     REACTION: hives     Current Outpatient Prescriptions on File Prior to Visit  Medication Sig Dispense Refill  . albuterol (PROVENTIL HFA) 108 (90 BASE) MCG/ACT inhaler Inhale 2 puffs into the lungs 4 (four) times daily as needed.      [name] atenolol (TENORMIN) 25 MG tablet Take 12.5 mg by mouth daily as needed (for symptoms of mitral prolapse).    . beclomethasone (QVAR) 80 MCG/ACT inhaler Inhale 2 puffs into the lungs daily.     . cyclobenzaprine (FLEXERIL) 10 MG tablet Take 5 mg by mouth at bedtime as needed.      . eletriptan (RELPAX) 20 MG tablet One tablet by mouth at onset of headache. May repeat in 2 hours if headache persists or recurs. 10 tablet 0  . fluconazole (DIFLUCAN) 150 MG tablet Take 1 tablet (150 mg total) by mouth once. 1 tablet 0  .  folic acid (FOLVITE) 1 MG tablet Take 1 mg by mouth daily.      [name] levofloxacin (LEVAQUIN) 500 MG tablet Take 1 tablet (500 mg total) by mouth daily. 7 tablet 0  . methotrexate (RHEUMATREX) 2.5 MG tablet Take 10 pills every Saturday. Caution:Chemotherapy. Protect from light.    [name] QVAR 80 MCG/ACT inhaler INHALE 2 PUFFS INTO THE LUNGS EVERY DAY 8.7 g 4  . traMADol (ULTRAM) 50 MG tablet Take 50 mg by mouth every 6 (six) hours as needed for moderate pain.     [name] VOLTAREN 1 % GEL Apply 2 g topically as needed (pain).      No current facility-administered medications on file prior to visit.    Review of Systems  Constitutional: Positive for appetite change and fatigue. Negative for fever and chills.  Respiratory: Positive for cough.       Objective:    BP 118/80 mmHg  Pulse 73  Temp(Src) 98.3 F (36.8 C) (Oral)  Resp 14  Ht 5\' 8"  (1.727 m)  Wt 261 lb 12.8 oz (118.752 kg)  BMI 39.82 kg/m2  SpO2 99% Nursing note and vital signs reviewed.  Physical Exam  Constitutional: She is oriented to person, place, and time. She appears well-developed and well-nourished. No distress.  HENT:  Right Ear: Hearing, tympanic membrane, external ear and ear canal normal.  Left Ear: Hearing, tympanic membrane, external ear and ear canal normal.  Nose: Nose normal. Right sinus exhibits no maxillary sinus tenderness and no frontal sinus tenderness. Left sinus exhibits no maxillary sinus tenderness and no frontal sinus tenderness.  Mouth/Throat: Uvula is midline, oropharynx is clear and moist and mucous membranes are normal.  Cardiovascular: Normal rate, regular rhythm, normal heart sounds and intact distal pulses.   Pulmonary/Chest: Effort normal and breath sounds normal. No respiratory distress. She has no wheezes. She has no rales.  Neurological: She is alert and oriented to person, place, and time.  Skin: Skin is warm and dry.  Psychiatric: She has a normal mood and affect. Her behavior is normal. Judgment  and thought content normal.       Assessment & Plan:   Problem List Items Addressed This Visit      Other   Cough - Primary    Continues to experience symptoms or cough with one day remaining of her level levofloxacin. Describes extreme fatigue and chest discomfort with cough. Obtain chest x-ray to rule out underlying pathology, however cannot rule out bronchiectasis. Start azithromycin if symptoms do not improve following completion of levofloxacin. Follow-up with pulmonology if no resolution is obtained after azithromycin.      Relevant Medications   azithromycin (ZITHROMAX) 250 MG tablet   Other Relevant Orders   DG Chest 2 View

## 2015-07-18 NOTE — Progress Notes (Signed)
Pre visit review using our clinic review tool, if applicable. No additional management support is needed unless otherwise documented below in the visit note. 

## 2015-07-26 ENCOUNTER — Telehealth: Payer: Self-pay | Admitting: Pulmonary Disease

## 2015-07-26 NOTE — Telephone Encounter (Signed)
Spoke with pt and relayed the below recs.  Pt opted for ov instead of prednisone.  Pt scheduled for Thursday as this was the next available appt.  Nothing further needed at this time.

## 2015-07-26 NOTE — Telephone Encounter (Signed)
Both are strong abx should have helped CXR did not show any pna.  Can see her on Friday if she can come in  If unable can try prednisone 10mg  4 tabs for 2 days, then 3 tabs for 2 days, 2 tabs for 2 days, then 1 tab for 2 days, then stop  #20 .  If she is not feeling better will need ov to be assessed.  She has complicated hx with polymyositis on MTX and bronchiectasis .  Please contact office for sooner follow up if symptoms do not improve or worsen or seek emergency care

## 2015-07-26 NOTE — Telephone Encounter (Signed)
Spoke with pt, for 3 weeks c/o fatigue, weakness, prod cough with dark yellow/[place] mucus, chest tightness.  Denies nausea, unsure of fever.  Has seen PCP twice for this, was given levaquin and zpak.  Took last dose of zpak today.  Pt also had cxr ordered by PCP, was told this was "fine".  cxr in Epic.  Pt requesting further recs.   Pt uses [facility] on [address].   Sending to TP as VS is unavailable.  TP please advise on further recs.  Thanks!

## 2015-07-28 ENCOUNTER — Encounter: Payer: Self-pay | Admitting: Adult Health

## 2015-07-28 ENCOUNTER — Ambulatory Visit (INDEPENDENT_AMBULATORY_CARE_PROVIDER_SITE_OTHER): Payer: Federal, State, Local not specified - PPO | Admitting: Adult Health

## 2015-07-28 VITALS — BP 126/84 | HR 78 | Temp 98.5°F | Ht 68.0 in | Wt 259.0 lb

## 2015-07-28 DIAGNOSIS — J471 Bronchiectasis with (acute) exacerbation: Secondary | ICD-10-CM | POA: Diagnosis not present

## 2015-07-28 MED ORDER — PREDNISONE 10 MG PO TABS: ORAL_TABLET | ORAL | Status: DC

## 2015-07-28 NOTE — Patient Instructions (Addendum)
Prednisone taper over next week.  Increase QVAR 2 puffs Twice daily  Until better then resume daily dosing.  Mucinex Twice daily  As needed cough and congestion  Saline nasal rinses As needed   Please contact office for sooner follow up if symptoms do not improve or worsen or seek emergency care  Follow up 4 months with [name]  And As needed

## 2015-07-28 NOTE — Assessment & Plan Note (Signed)
Slow to resolve flare  Hold on additonal abx for now - Steroid trial   Plan  Prednisone taper over next week.  Mucinex Twice daily  As needed cough and congestion  Saline nasal rinses As needed   Please contact office for sooner follow up if symptoms do not improve or worsen or seek emergency care  Follow up 4 months with [name]  And As needed

## 2015-07-28 NOTE — Progress Notes (Signed)
   Subjective:    Patient ID: Jill Harper, female    DOB: 11/30/1965, 49 y.o.   MRN: [id number]  HPI  49 yo female with Bronchiectasis in setting of polymyositis   07/28/2015 Acute OV  : Bronchiectasis Presents for an acute office visit.  Complains of 2-3 weeks of  prod cough with dark green mucus, chest congestion/tightness, fatigue, and wheezing at times. Low grade fever, chills, nausea and vomiting initially . Vomiting has resolved . Has low appetite .  CXR on 12/12 with chronic changes.  Seen by PCP , She completed zpak and levaquin.  She is feeling some better but remains very weak. Denies any chest pain, orthopnea, PND, leg swelling.  Remains on Methotrexate , fby Rheumatology     TEST  CT chest 03/23/05 >> patchy b/l lower lung ASD with cylindrical BTX CT chest 02/17/08 >> peripheral and basilar predominant subpleural GGO CT chest 12/01/08 >> no change PFT 10/30/11 >> FEV1 3.31 (114%), FEV1% 85, TLC 6.06 (108%), DLCO 77%, no BD PFT 11/18/12 >> FEV1 3.18 (111%), FEV1% 84, TLC 5.34 (95%), DLCO 67%, no BD   Review of Systems  Constitutional:   No  weight loss, night sweats,  Fevers, chills, +fatigue, or  lassitude.  HEENT:   No headaches,  Difficulty swallowing,  Tooth/dental problems, or  Sore throat,                No sneezing, itching, ear ache, nasal congestion, post nasal drip,   CV:  No chest pain,  Orthopnea, PND, swelling in lower extremities, anasarca, dizziness, palpitations, syncope.   GI  No heartburn, indigestion, abdominal pain, nausea, vomiting, diarrhea, change in bowel habits, loss of appetite, bloody stools.   Resp:    No chest wall deformity  Skin: no rash or lesions.  GU: no dysuria, change in color of urine, no urgency or frequency.  No flank pain, no hematuria   MS:  No joint pain or swelling.  No decreased range of motion.  No back pain.  Psych:  No change in mood or affect. No depression or anxiety.  No memory loss.         Objective:   Physical Exam  Filed Vitals:   07/28/15 1114  BP: 126/84  Pulse: 78  Temp: 98.5 F (36.9 C)  TempSrc: Oral  Height: 5\' 8"  (1.727 m)  Weight: 259 lb (117.482 kg)  SpO2: 98%    GEN: A/Ox3; pleasant , NAD,obese   HEENT:  [place]/AT,  EACs-clear, TMs-wnl, NOSE-clear, THROAT-clear, no lesions, no postnasal drip or exudate noted.   NECK:  Supple w/ fair ROM; no JVD; normal carotid impulses w/o bruits; no thyromegaly or nodules palpated; no lymphadenopathy.  RESP  Clear  P & A; w/o, wheezes/ rales/ or rhonchi.no accessory muscle use, no dullness to percussion  CARD:  RRR, no m/r/g  , no peripheral edema, pulses intact, no cyanosis or clubbing.  GI:   Soft & nt; nml bowel sounds; no organomegaly or masses detected.  Musco: Warm bil, no deformities or joint swelling noted.   Neuro: alert, no focal deficits noted.    Skin: Warm, no lesions or rashes        Assessment & Plan:

## 2015-07-30 NOTE — Progress Notes (Signed)
Reviewed and agree with assessment/plan. 

## 2015-08-15 ENCOUNTER — Telehealth: Payer: Self-pay | Admitting: Pulmonary Disease

## 2015-08-15 NOTE — Telephone Encounter (Signed)
Spoke with the pt  She states that is calling to check the status of her FMLA forms  They were supposed to have already been faxed to [name] according to [employer]  I do not see these in Dr Juanetta Gosling lookat  Will forward to him and Ashtyn to check on these  Please advise thanks!

## 2015-08-16 NOTE — Telephone Encounter (Signed)
FMLA forms completed and have been sent back to [place] records to be processed.  ATTNElmyra Ricks w/ [employer]  Pt aware that forms are being sent back to [place] at [facility] records.  Nothing further needed.

## 2015-08-16 NOTE — Telephone Encounter (Signed)
I do not have anything in my possession nor is there anything in Dr Collie Siad look at. I will check with him this afternoon to see if he has these forms.

## 2015-08-16 NOTE — Telephone Encounter (Signed)
Will route message to Jill Harper. VS might return the form to her.

## 2015-08-16 NOTE — Telephone Encounter (Signed)
Form completed.

## 2015-09-03 ENCOUNTER — Encounter: Payer: Self-pay | Admitting: Primary Care

## 2015-09-03 ENCOUNTER — Ambulatory Visit (INDEPENDENT_AMBULATORY_CARE_PROVIDER_SITE_OTHER): Payer: Federal, State, Local not specified - PPO | Admitting: Primary Care

## 2015-09-03 VITALS — BP 120/70 | HR 75 | Temp 98.3°F | Wt 263.0 lb

## 2015-09-03 DIAGNOSIS — R059 Cough, unspecified: Secondary | ICD-10-CM

## 2015-09-03 DIAGNOSIS — R05 Cough: Secondary | ICD-10-CM

## 2015-09-03 MED ORDER — BENZONATATE 200 MG PO CAPS: 200.0000 mg | ORAL_CAPSULE | Freq: Three times a day (TID) | ORAL | Status: DC | PRN

## 2015-09-03 NOTE — Progress Notes (Signed)
Subjective:    Patient ID: Jill Harper, female    DOB: 1966/07/16, 50 y.o.   MRN: [id number]  HPI  Jill Harper is a 50 year old female who presents today with a chief complaint of cough. She was evaluated in December 2016, diagnosed with acute bacterial URI, and initiated on a levaquin course.She was evaluated again several days later for post infectious cough. She underwent chest xray and was though to have bronchiectasis. She was initiated on a Zpak during that visit and sent to pulmonology for further evaluation. She followed with pulmonolgy several days later and was placed on a prednisone taper, mucinex, and saline nasal rinses.   Her cough recently has been present for the previous 3 days. She also reports scratchy throat, post nasal drip, swelling to her glands, chills. Her cough is productive with yellow/[place]. Denies fevers. She's been taking Mucinex, tylenol, and increase intake of fluids. She is afraid that the cycle of symptoms from early December are re-starting. She has an appointment Wednesday next week to see pulmonology.   Review of Systems  Constitutional: Positive for chills and fatigue. Negative for fever.  HENT: Positive for postnasal drip and rhinorrhea. Negative for congestion, ear pain and sinus pressure.   Respiratory: Positive for cough. Negative for shortness of breath and wheezing.   Cardiovascular: Negative for chest pain.       Past Medical History  Diagnosis Date  . Bronchiectasis        . Polymyositis ([place])     Dr [name]; chronic MTX  . MVP (mitral valve prolapse)   . Fibromyalgia   . Abnormal liver function test   . Menorrhagia   . OAB (overactive bladder)   . MIGRAINE HEADACHE     Social History   Social History  . Marital Status: Married    Spouse Name: N/A  . Number of Children: N/A  . Years of Education: N/A   Occupational History  . [name] in Sales executive for delivery     Social History Main Topics  .  Smoking status: Never Smoker   . Smokeless tobacco: Never Used     Comment: Married, lives with spouse. 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 Alcohol Use: No  . Drug Use: No  . Sexual Activity: Not on file   Other Topics Concern  . Not on file   Social History Narrative    Past Surgical History  Procedure Laterality Date  . Cardiac catheterization  2002    normal  . Dilation and curettage of uterus  08-31-08    Dr [name]    Family History  Problem Relation Age of Onset  . Diabetes Mother   . Fibromyalgia Mother   . Ulcers Mother     Allergies  Allergen Reactions  . Sulfonamide Derivatives     REACTION: hives    Current Outpatient Prescriptions on File Prior to Visit  Medication Sig Dispense Refill  . atenolol (TENORMIN) 25 MG tablet Take 12.5 mg by mouth daily as needed (for symptoms of mitral prolapse).    . beclomethasone (QVAR) 80 MCG/ACT inhaler Inhale 2 puffs into the lungs daily.     [name] eletriptan (RELPAX) 20 MG tablet One tablet by mouth at onset of headache. May repeat in 2 hours if headache persists or recurs. 10 tablet 0  . folic acid (FOLVITE) 1 MG tablet Take 1 mg by mouth daily.      [name]  methotrexate (RHEUMATREX) 2.5 MG tablet Take 10 pills every Saturday. Caution:Chemotherapy. Protect from light.    . predniSONE (DELTASONE) 10 MG tablet 4 tabs for 2 days, then 3 tabs for 2 days, 2 tabs for 2 days, then 1 tab for 2 days, then stop 20 tablet 0  . VOLTAREN 1 % GEL Apply 2 g topically as needed (pain).     [name] albuterol (PROVENTIL HFA) 108 (90 BASE) MCG/ACT inhaler Inhale 2 puffs into the lungs 4 (four) times daily as needed. Reported on 09/03/2015    . cyclobenzaprine (FLEXERIL) 10 MG tablet Take 5 mg by mouth at bedtime as needed. Reported on 09/03/2015    . traMADol (ULTRAM) 50 MG tablet Take 50 mg by mouth every 6 (six) hours as needed for moderate pain. Reported on 09/03/2015     No current facility-administered  medications on file prior to visit.    BP 120/70 mmHg  Pulse 75  Temp(Src) 98.3 F (36.8 C)  Wt 263 lb (119.296 kg)  SpO2 99%    Objective:   Physical Exam  Constitutional: She appears well-nourished.  HENT:  Right Ear: Ear canal normal.  Left Ear: Ear canal normal.  Nose: Mucosal edema present. Right sinus exhibits no frontal sinus tenderness. Left sinus exhibits no frontal sinus tenderness.  Mouth/Throat: Oropharynx is clear and moist.  Dullness to TM's bilaterally. Mild tenderness to maxillary sinuses bilaterally.  Cardiovascular: Normal rate and regular rhythm.   Pulmonary/Chest: Effort normal. She has no wheezes. She has no rales.  Skin: Skin is warm and dry.          Assessment & Plan:  Allergic Rhinitis vs URI:  Cough, postnasal drip, rhinorrhea, scrachy throat x 3 days. No fevers, vomiting. Exam with clear lungs throughout, nasal mucosa edema to nares. Postnasal drip noted to posterior pharynx.  Suspect viral/allergy involvement at this point. Does not appear toxic and is stable for follow up with pulmonology next week if symptoms persist.  Discussed course of viral URI and allergic rhinitis and provided home instructions for treatment. Start Zyrtec HS for postnasal drip. Continue Mucinex. RX for tessalon pearls sent for cough. Fluids, rest, return precautions provided.

## 2015-09-03 NOTE — Patient Instructions (Signed)
You may take Benzonatate capsules for cough. Take 1 capsule by mouth three times daily as needed for cough.  Continue taking Mucinex DM for chest congestion and cough.  Start taking Zyrtec at bedtime for throat drainage.  Keep you appointment with Pulmonology on Wednesday.  Ensure you are staying hydrated with water.  It was a pleasure meeting you!   Upper Respiratory Infection, Adult Most upper respiratory infections (URIs) are a viral infection of the air passages leading to the lungs. A URI affects the nose, throat, and upper air passages. The most common type of URI is nasopharyngitis and is typically referred to as "the common cold." URIs run their course and usually go away on their own. Most of the time, a URI does not require medical attention, but sometimes a bacterial infection in the upper airways can follow a viral infection. This is called a secondary infection. Sinus and middle ear infections are common types of secondary upper respiratory infections. Bacterial pneumonia can also complicate a URI. A URI can worsen asthma and chronic obstructive pulmonary disease (COPD). Sometimes, these complications can require emergency medical care and may be life threatening.  CAUSES Almost all URIs are caused by viruses. A virus is a type of germ and can spread from one person to another.  RISKS FACTORS You may be at risk for a URI if:   You smoke.   You have chronic heart or lung disease.  You have a weakened defense (immune) system.   You are very young or very old.   You have nasal allergies or asthma.  You work in crowded or poorly ventilated areas.  You work in health care facilities or schools. SIGNS AND SYMPTOMS  Symptoms typically develop 2-3 days after you come in contact with a cold virus. Most viral URIs last 7-10 days. However, viral URIs from the influenza virus (flu virus) can last 14-18 days and are typically more severe. Symptoms may include:   Runny or  stuffy (congested) nose.   Sneezing.   Cough.   Sore throat.   Headache.   Fatigue.   Fever.   Loss of appetite.   Pain in your forehead, behind your eyes, and over your cheekbones (sinus pain).  Muscle aches.  DIAGNOSIS  Your health care provider may diagnose a URI by:  Physical exam.  Tests to check that your symptoms are not due to another condition such as:  Strep throat.  Sinusitis.  Pneumonia.  Asthma. TREATMENT  A URI goes away on its own with time. It cannot be cured with medicines, but medicines may be prescribed or recommended to relieve symptoms. Medicines may help:  Reduce your fever.  Reduce your cough.  Relieve nasal congestion. HOME CARE INSTRUCTIONS   Take medicines only as directed by your health care provider.   Gargle warm saltwater or take cough drops to comfort your throat as directed by your health care provider.  Use a warm mist humidifier or inhale steam from a shower to increase air moisture. This may make it easier to breathe.  Drink enough fluid to keep your urine clear or pale yellow.   Eat soups and other clear broths and maintain good nutrition.   Rest as needed.   Return to work when your temperature has returned to normal or as your health care provider advises. You may need to stay home longer to avoid infecting others. You can also use a face mask and careful hand washing to prevent spread of the virus.  Increase the usage of your inhaler if you have asthma.   Do not use any tobacco products, including cigarettes, chewing tobacco, or electronic cigarettes. If you need help quitting, ask your health care provider. PREVENTION  The best way to protect yourself from getting a cold is to practice good hygiene.   Avoid oral or hand contact with people with cold symptoms.   Wash your hands often if contact occurs.  There is no clear evidence that vitamin C, vitamin E, echinacea, or exercise reduces the chance  of developing a cold. However, it is always recommended to get plenty of rest, exercise, and practice good nutrition.  SEEK MEDICAL CARE IF:   You are getting worse rather than better.   Your symptoms are not controlled by medicine.   You have chills.  You have worsening shortness of breath.  You have Fait or red mucus.  You have yellow or [place] nasal discharge.  You have pain in your face, especially when you bend forward.  You have a fever.  You have swollen neck glands.  You have pain while swallowing.  You have white areas in the back of your throat. SEEK IMMEDIATE MEDICAL CARE IF:   You have severe or persistent:  Headache.  Ear pain.  Sinus pain.  Chest pain.  You have chronic lung disease and any of the following:  Wheezing.  Prolonged cough.  Coughing up blood.  A change in your usual mucus.  You have a stiff neck.  You have changes in your:  Vision.  Hearing.  Thinking.  Mood. MAKE SURE YOU:   Understand these instructions.  Will watch your condition.  Will get help right away if you are not doing well or get worse.   This information is not intended to replace advice given to you by your health care provider. Make sure you discuss any questions you have with your health care provider.   Document Released: 01/16/2001 Document Revised: 12/07/2014 Document Reviewed: 10/28/2013 Elsevier Interactive Patient Education [employer].

## 2015-09-07 ENCOUNTER — Ambulatory Visit (INDEPENDENT_AMBULATORY_CARE_PROVIDER_SITE_OTHER): Payer: Federal, State, Local not specified - PPO | Admitting: Adult Health

## 2015-09-07 ENCOUNTER — Ambulatory Visit (INDEPENDENT_AMBULATORY_CARE_PROVIDER_SITE_OTHER)
Admission: RE | Admit: 2015-09-07 | Discharge: 2015-09-07 | Disposition: A | Discharge status: Home / Self Care | Payer: Federal, State, Local not specified - PPO | Source: Ambulatory Visit | Attending: Cardiovascular Disease | Admitting: Cardiovascular Disease

## 2015-09-07 ENCOUNTER — Ambulatory Visit (INDEPENDENT_AMBULATORY_CARE_PROVIDER_SITE_OTHER)
Admission: RE | Admit: 2015-09-07 | Discharge: 2015-09-07 | Disposition: A | Discharge status: Home / Self Care | Payer: Federal, State, Local not specified - PPO | Source: Ambulatory Visit | Attending: Adult Health | Admitting: Adult Health

## 2015-09-07 ENCOUNTER — Encounter: Payer: Self-pay | Admitting: Adult Health

## 2015-09-07 VITALS — BP 122/82 | HR 84 | Temp 98.7°F | Ht 68.0 in | Wt 257.0 lb

## 2015-09-07 DIAGNOSIS — J479 Bronchiectasis, uncomplicated: Secondary | ICD-10-CM

## 2015-09-07 DIAGNOSIS — J471 Bronchiectasis with (acute) exacerbation: Secondary | ICD-10-CM

## 2015-09-07 MED ORDER — HYDROCODONE-HOMATROPINE 5-1.5 MG/5ML PO SYRP: ORAL_SOLUTION | ORAL | Status: DC

## 2015-09-07 MED ORDER — LEVALBUTEROL HCL 0.63 MG/3ML IN NEBU
0.6300 mg | INHALATION_SOLUTION | Freq: Once | RESPIRATORY_TRACT | Status: AC
  Administered 2015-09-07: 0.63 mg via RESPIRATORY_TRACT

## 2015-09-07 MED ORDER — PREDNISONE 10 MG PO TABS: ORAL_TABLET | ORAL | Status: DC

## 2015-09-07 MED ORDER — AMOXICILLIN-POT CLAVULANATE 875-125 MG PO TABS: 1.0000 | ORAL_TABLET | Freq: Two times a day (BID) | ORAL | Status: AC

## 2015-09-07 NOTE — Patient Instructions (Signed)
Augmentin 875mg  Twice daily  For 10 days . Take with food Eat yogurt daily .  Prednisone taper over next week.  Hydromet 1 tsp every 6hr As needed  Cough, may make you sleepy .  Continue on  QVAR 2 puffs Twice daily  Until better then resume daily dosing.  Mucinex Twice daily  As needed cough and congestion  Saline nasal rinses As needed  Set up for CT sinus and HR CT Chest .   Please contact office for sooner follow up if symptoms do not improve or worsen or seek emergency care  Follow up 2  months with [name]  And As needed

## 2015-09-07 NOTE — Progress Notes (Signed)
   Subjective:    Patient ID: Jill Harper, female    DOB: May 04, 1966, 50 y.o.   MRN: [id number]  HPI 50 yo female with Bronchiectasis in setting of polymyositis   09/07/2015 Acute OV  : Bronchiectasis Presents for an acute office visit.  Complains of 1 week of chest congestion/tightness, prod cough with yellow to tan colored mucus, and occasional wheezing starting on 08/31/15. Denies any sinus drainage/pressure, fever, nausea or vomiting.    seen for similar symptoms in 12/22 , w/ slow to resolve flare  Tx with prednsione taper . She had taken 2 abx prior to that visit . CXR on 12/12 with chronic changes. Says she got completely better unil last. Week.  She was seen by PCP last week , tx w/ zytec and tessalon.   Remains on Methotrexate , fby Rheumatology    TEST  CT chest 03/23/05 >> patchy b/l lower lung ASD with cylindrical BTX CT chest 02/17/08 >> peripheral and basilar predominant subpleural GGO CT chest 12/01/08 >> no change PFT 10/30/11 >> FEV1 3.31 (114%), FEV1% 85, TLC 6.06 (108%), DLCO 77%, no BD PFT 11/18/12 >> FEV1 3.18 (111%), FEV1% 84, TLC 5.34 (95%), DLCO 67%, no BD   Review of Systems Constitutional:   No  weight loss, night sweats,  Fevers, chills, +fatigue, or  lassitude.  HEENT:   No headaches,  Difficulty swallowing,  Tooth/dental problems, or  Sore throat,                No sneezing, itching, ear ache +, nasal congestion, post nasal drip,   CV:  No chest pain,  Orthopnea, PND, swelling in lower extremities, anasarca, dizziness, palpitations, syncope.   GI  No heartburn, indigestion, abdominal pain, nausea, vomiting, diarrhea, change in bowel habits, loss of appetite, bloody stools.   Resp:    No chest wall deformity  Skin: no rash or lesions.  GU: no dysuria, change in color of urine, no urgency or frequency.  No flank pain, no hematuria   MS:  No joint pain or swelling.  No decreased range of motion.  No back pain.  Psych:  No change in mood or affect. No  depression or anxiety.  No memory loss.         Objective:   Physical Exam Filed Vitals:   09/07/15 1155  BP: 122/82  Pulse: 84  Temp: 98.7 F (37.1 C)  TempSrc: Oral  Height: 5\' 8"  (1.727 m)  Weight: 257 lb (116.574 kg)  SpO2: 97%    GEN: A/Ox3; pleasant , NAD,obese   HEENT:  [place]/AT,  EACs-clear, TMs-wnl, NOSE-clear, mild sinus tenderness THROAT-clear, no lesions, no postnasal drip or exudate noted.   NECK:  Supple w/ fair ROM; no JVD; normal carotid impulses w/o bruits; no thyromegaly or nodules palpated; no lymphadenopathy.  RESP  Few trace rhonchi , no accessory muscle use, no dullness to percussion  CARD:  RRR, no m/r/g  , no peripheral edema, pulses intact, no cyanosis or clubbing.  GI:   Soft & nt; nml bowel sounds; no organomegaly or masses detected.  Musco: Warm bil, no deformities or joint swelling noted.   Neuro: alert, no focal deficits noted.    Skin: Warm, no lesions or rashes        Assessment & Plan:

## 2015-09-08 ENCOUNTER — Other Ambulatory Visit: Payer: Self-pay | Admitting: Adult Health

## 2015-09-08 DIAGNOSIS — R05 Cough: Secondary | ICD-10-CM

## 2015-09-08 DIAGNOSIS — J479 Bronchiectasis, uncomplicated: Secondary | ICD-10-CM

## 2015-09-08 DIAGNOSIS — R059 Cough, unspecified: Secondary | ICD-10-CM

## 2015-09-08 NOTE — Progress Notes (Signed)
Quick Note:  Called and spoke with patient. Informed her of TP's results and recs. Scheduled her for follow up ov with TP on 10/11/15. Pt voiced understanding and had no further questions. ______

## 2015-09-08 NOTE — Progress Notes (Signed)
Reviewed and agree with assessment/plan. 

## 2015-09-08 NOTE — Assessment & Plan Note (Addendum)
Recurrent exacerbation +/-sinusitis  Pt is on Methotrexate , will set up for HRCT to evaluate for possible MTX pulm tox.  Also check CT sinus for possible sinus involvement  Would check Full PFT on return   Plan  Augmentin 875mg  Twice daily  For 10 days . Take with food Eat yogurt daily .  Prednisone taper over next week.  Hydromet 1 tsp every 6hr As needed  Cough, may make you sleepy .  Continue on  QVAR 2 puffs Twice daily  Until better then resume daily dosing.  Mucinex Twice daily  As needed cough and congestion  Saline nasal rinses As needed  Set up for CT sinus and HR CT Chest .   Please contact office for sooner follow up if symptoms do not improve or worsen or seek emergency care  Follow up 2  months with [name]  And As needed

## 2015-09-08 NOTE — Progress Notes (Signed)
Quick Note:  Called and spoke with pt. Reviewed results and recs. Pt voiced understanding and had no further questions. ______ 

## 2015-09-12 ENCOUNTER — Other Ambulatory Visit: Payer: Federal, State, Local not specified - PPO

## 2015-09-12 ENCOUNTER — Inpatient Hospital Stay: Admission: RE | Admit: 2015-09-12 | Payer: Federal, State, Local not specified - PPO | Source: Ambulatory Visit

## 2015-09-20 ENCOUNTER — Telehealth: Payer: Self-pay | Admitting: Pulmonary Disease

## 2015-09-20 DIAGNOSIS — R059 Cough, unspecified: Secondary | ICD-10-CM

## 2015-09-20 DIAGNOSIS — R05 Cough: Secondary | ICD-10-CM

## 2015-09-20 NOTE — Telephone Encounter (Signed)
Spoke with pt, states that since Sunday pt has L sided "heaviness" on mid/upper back.  Pt finished augmentin prescribed by TP at last ov on Sunday Morning- s/s improved but prod cough with clear/white mucus still present, as well as SOB and fatigue.  Denies fever, nausea, chills.   Pt uses [facility] on [address]   TP please advise on recs, as VS is on vacation.  Thanks!

## 2015-09-20 NOTE — Telephone Encounter (Signed)
CT showed possible PNA , if not better will need ov  Dr. Melvyn Novas  Has opening tomorrow, can place with him as I have seen last few times.  May need cxr prior to visit to check to see if PNA is clearing.

## 2015-09-20 NOTE — Telephone Encounter (Signed)
Called pt, aware of recs.  Scheduled at 2:15 with MW tomorrow.  cxr ordered, pt will get this prior to ov.  Nothing further needed.

## 2015-09-21 ENCOUNTER — Ambulatory Visit (INDEPENDENT_AMBULATORY_CARE_PROVIDER_SITE_OTHER)
Admission: RE | Admit: 2015-09-21 | Discharge: 2015-09-21 | Disposition: A | Discharge status: Home / Self Care | Payer: Federal, State, Local not specified - PPO | Source: Ambulatory Visit | Attending: Adult Health | Admitting: Adult Health

## 2015-09-21 ENCOUNTER — Ambulatory Visit (INDEPENDENT_AMBULATORY_CARE_PROVIDER_SITE_OTHER): Payer: Federal, State, Local not specified - PPO | Admitting: Internal Medicine

## 2015-09-21 ENCOUNTER — Encounter: Payer: Self-pay | Admitting: Internal Medicine

## 2015-09-21 VITALS — BP 130/84 | HR 84 | Temp 98.0°F | Ht 68.0 in | Wt 255.2 lb

## 2015-09-21 DIAGNOSIS — R05 Cough: Secondary | ICD-10-CM | POA: Diagnosis not present

## 2015-09-21 DIAGNOSIS — J471 Bronchiectasis with (acute) exacerbation: Secondary | ICD-10-CM

## 2015-09-21 DIAGNOSIS — R059 Cough, unspecified: Secondary | ICD-10-CM

## 2015-09-21 MED ORDER — PREDNISONE 10 MG PO TABS: ORAL_TABLET | ORAL | Status: DC

## 2015-09-21 MED ORDER — LEVOFLOXACIN 500 MG PO TABS: 500.0000 mg | ORAL_TABLET | Freq: Every day | ORAL | Status: DC

## 2015-09-21 MED ORDER — FLUTTER DEVI: Status: DC

## 2015-09-21 NOTE — Assessment & Plan Note (Addendum)
CT sinus 09/07/2015 neg for acute dz.  HRCT 09/07/2015 >Basilar predominant peribronchovascular ground-glass is indicative of bronchopneumonia. . No definitive evidence of underlying interstitial lung disease. - Added flutter 09/21/2015 >>>  Pattern of recurrent localized cp typical of bronchiectasis partially resp to augmentin suggesting may now be colonized with resistant gnrs so rec rx with levaquin and more aggressive measures to improve mucus clearance including perhaps the VEST is not better with saba and flutter and max mucinex  I had an extended discussion with the patient reviewing all relevant studies completed to date and  lasting  25 minutes of a 40 minute acute extended  visit    Each maintenance medication was reviewed in detail including most importantly the difference between maintenance and prns and under what circumstances the prns are to be triggered using an action plan format that is not reflected in the computer generated alphabetically organized AVS.    Please see instructions for details which were reviewed in writing and the patient given a copy highlighting the part that I personally wrote and discussed at today's ov.

## 2015-09-21 NOTE — Patient Instructions (Addendum)
Levaquin 500 mg daily x 7 days  Prednisone 10 mg take  4 each am x 2 days,   2 each am x 2 days,  1 each am x 2 days and stop   For cough use mucinex up to 12 hours every 12 hours as needed and use the flutter valve  If still coughing use the tramadol 50 mg one every 4 hours as needed   Keep appt to see Tammy as planned but call us in meantime if needed

## 2015-09-21 NOTE — Progress Notes (Signed)
Subjective:    Patient ID: Jill Harper, female    DOB: [birth year], 50 y.o.   MRN: [id number]  HPI 18 yobf never smoker  with Bronchiectasis in setting of polymyositis   09/07/2015 NP Acute OV  : Bronchiectasis Presents for an acute office visit.  Complains of 1 week of chest congestion/tightness, prod cough with yellow to tan colored mucus, and occasional wheezing starting on 08/31/15. Denies any sinus drainage/pressure, fever, nausea or vomiting.    seen for similar symptoms in 12/22 , w/ slow to resolve flare  Tx with prednsione taper . She had taken 2 abx prior to that visit . CXR on 12/12 with chronic changes. Says she got completely better unil last. Week.  She was seen by PCP last week , tx w/ zytec and tessalon.  Remains on Methotrexate , fby Rheumatology  rec Augmentin 875mg  Twice daily  For 10 days . Take with food Eat yogurt daily .  Prednisone taper over next week.  Hydromet 1 tsp every 6hr As needed  Cough, may make you sleepy .  Continue on  QVAR 2 puffs Twice daily  Until better then resume daily dosing.  Mucinex Twice daily  As needed cough and congestion  Saline nasal rinses As needed  Set up for CT sinus and HR CT Chest .    09/21/2015 acute extended ov/Wert re: recurrent cough in pt with bronchiectasis/ polymyositis on maint mtx  Chief Complaint  Patient presents with  . Acute Visit    Pt c/o "heaviness" in her chest for the past 4-5 days. She still has some lingering cough and congestion since her last visit here. Her cough is prod with white to dark yellow/[place] sputum.    mucus was dark yellow, scant bloody abx finished abx sat 2/11 / then 2/13 turned dark [place], assoc localized L post cp 24/7 no worse with cough  Or deep breath.  No sob over baseline or need for extra saba - pain is in same areas as prev flares of bronchiectasis  No obvious day to day or daytime variability or assoc  chest tightness, subjective wheeze or overt sinus or hb symptoms. No unusual exp hx  or h/o childhood pna/ asthma or knowledge of premature birth.  Sleeping ok without nocturnal  or early am exacerbation  of respiratory  c/o's or need for noct saba. Also denies any obvious fluctuation of symptoms with weather or environmental changes or other aggravating or alleviating factors except as outlined above   Current Medications, Allergies, Complete Past Medical History, Past Surgical History, Family History, and Social History were reviewed in [employer] record.  ROS  The following are not active complaints unless bolded sore throat, dysphagia, dental problems, itching, sneezing,  nasal congestion or excess/ purulent secretions, ear ache,   fever, chills, sweats, unintended wt loss, classically pleuritic or exertional cp, hemoptysis,  orthopnea pnd or leg swelling, presyncope, palpitations, abdominal pain, anorexia, nausea, vomiting, diarrhea  or change in bowel or bladder habits, change in stools or urine, dysuria,hematuria,  rash, arthralgias, visual complaints, headache, numbness, weakness or ataxia or problems with walking or coordination,  change in mood/affect or memory.           TEST  CT chest 03/23/05 >> patchy b/l lower lung ASD with cylindrical BTX CT chest 02/17/08 >> peripheral and basilar predominant subpleural GGO CT chest 12/01/08 >> no change PFT 10/30/11 >> FEV1 3.31 (114%), FEV1% 85, TLC 6.06 (108%), DLCO 77%, no BD PFT  11/18/12 >> FEV1 3.18 (111%), FEV1% 84, TLC 5.34 (95%), DLCO 67%, no BD           Objective:   Physical Exam   GEN: A/Ox3; pleasant , NAD,obese    Wt Readings from Last 3 Encounters:  09/21/15 255 lb 3.2 oz (115.758 kg)  09/07/15 257 lb (116.574 kg)  09/03/15 263 lb (119.296 kg)    Vital signs reviewed - note sats 98% RA  HEENT:  [place]/AT,  EACs-clear, TMs-wnl, NOSE-clear, mild sinus tenderness THROAT-clear, no lesions, no postnasal drip or exudate noted.   NECK:  Supple w/ fair ROM; no JVD; normal carotid impulses  w/o bruits; no thyromegaly or nodules palpated; no lymphadenopathy.  RESP  no accessory muscle use, no dullness to percussion/ minimal insp and exp rhonchi bilaterally   CARD:  RRR, no m/r/g  , no peripheral edema, pulses intact, no cyanosis or clubbing.  GI:   Soft & nt; nml bowel sounds; no organomegaly or masses detected.  Musco: Warm bil, no deformities or joint swelling noted.   Neuro: alert, no focal deficits noted.    Skin: Warm, no lesions or rashes    CXR PA and Lateral:   09/21/2015 :    I personally reviewed images and agree with radiology impression as follows:   No visible focal airspace opacity. No active disease.    Assessment & Plan:

## 2015-10-10 ENCOUNTER — Encounter: Payer: Self-pay | Admitting: Adult Health

## 2015-10-10 ENCOUNTER — Ambulatory Visit (INDEPENDENT_AMBULATORY_CARE_PROVIDER_SITE_OTHER): Payer: Federal, State, Local not specified - PPO | Admitting: Adult Health

## 2015-10-10 VITALS — BP 132/82 | HR 74 | Temp 97.7°F | Ht 68.0 in | Wt 258.0 lb

## 2015-10-10 DIAGNOSIS — J471 Bronchiectasis with (acute) exacerbation: Secondary | ICD-10-CM | POA: Diagnosis not present

## 2015-10-10 MED ORDER — ALBUTEROL SULFATE HFA 108 (90 BASE) MCG/ACT IN AERS: 2.0000 | INHALATION_SPRAY | Freq: Four times a day (QID) | RESPIRATORY_TRACT | Status: DC | PRN

## 2015-10-10 NOTE — Patient Instructions (Signed)
Continue on current regimen .  So glad your are better.  Follow up [name]  Next month as planned with PFT .

## 2015-10-10 NOTE — Addendum Note (Signed)
Addended by: Osa Craver on: 10/10/2015 10:35 AM   Modules accepted: Orders

## 2015-10-10 NOTE — Progress Notes (Signed)
Reviewed and agree with assessment/plan. 

## 2015-10-10 NOTE — Progress Notes (Signed)
   Subjective:    Patient ID: Jill Harper, female    DOB: 05-07-1966, 50 y.o.   MRN: [id number]  HPI  50 yo female with Bronchiectasis in setting of polymyositis   10/10/2015 Follow up   : Bronchiectasis Presents for follow up for PNA and slow to resolve/recurrent  bronchiectasis flare . She had a slow to resolve flare since 07/2015 , tx w/ 2 courses of abx and steroids. HRCT Chest and Sinus CT showed BB GGO c/w PNA and neg CT sinus. She was given Levaquin and pred taper by Dr. Melvyn Novas  3 weeks ago, says she is feeling much better. Cough and congestion are much better. Started on flutter valve.   Remains on Methotrexate , fby Rheumatology for polymyositis .    TEST  HRCT chest 09/07/15 BB GGO , no definite bronchiectasis  CT sinus 09/07/15  Very mild paranasal sinus dz, no acute findings.  CT chest 03/23/05 >> patchy b/l lower lung ASD with cylindrical BTX CT chest 02/17/08 >> peripheral and basilar predominant subpleural GGO CT chest 12/01/08 >> no change PFT 10/30/11 >> FEV1 3.31 (114%), FEV1% 85, TLC 6.06 (108%), DLCO 77%, no BD PFT 11/18/12 >> FEV1 3.18 (111%), FEV1% 84, TLC 5.34 (95%), DLCO 67%, no BD   Review of Systems  Constitutional:   No  weight loss, night sweats,  Fevers, chills,fatigue, or  lassitude.  HEENT:   No headaches,  Difficulty swallowing,  Tooth/dental problems, or  Sore throat,                No sneezing, itching, ear ache, nasal congestion, post nasal drip,   CV:  No chest pain,  Orthopnea, PND, swelling in lower extremities, anasarca, dizziness, palpitations, syncope.   GI  No heartburn, indigestion, abdominal pain, nausea, vomiting, diarrhea, change in bowel habits, loss of appetite, bloody stools.   Resp:    No chest wall deformity  Skin: no rash or lesions.  GU: no dysuria, change in color of urine, no urgency or frequency.  No flank pain, no hematuria   MS:  No joint pain or swelling.  No decreased range of motion.  No back pain.  Psych:  No change in mood or  affect. No depression or anxiety.  No memory loss.         Objective:   Physical Exam  Filed Vitals:   10/10/15 0921  BP: 132/82  Pulse: 74  Temp: 97.7 F (36.5 C)  TempSrc: Oral  Height: 5\' 8"  (1.727 m)  Weight: 258 lb (117.028 kg)  SpO2: 97%    GEN: A/Ox3; pleasant , NAD,obese   HEENT:  [place]/AT,  EACs-clear, TMs-wnl, NOSE-clear, mild sinus tenderness THROAT-clear, no lesions, no postnasal drip or exudate noted.   NECK:  Supple w/ fair ROM; no JVD; normal carotid impulses w/o bruits; no thyromegaly or nodules palpated; no lymphadenopathy.  RESP CTA w /no wheezing no accessory muscle use, no dullness to percussion  CARD:  RRR, no m/r/g  , no peripheral edema, pulses intact, no cyanosis or clubbing.  GI:   Soft & nt; nml bowel sounds; no organomegaly or masses detected.  Musco: Warm bil, no deformities or joint swelling noted.   Neuro: alert, no focal deficits noted.    Skin: Warm, no lesions or rashes        Assessment & Plan:

## 2015-10-10 NOTE — Assessment & Plan Note (Addendum)
Recent flare +possible PNA on CT chest  No ILD on CT chest .  Check PFT on return   Plan  Continue on current regimen .  So glad your are better.  Follow up [name]  Next month as planned with PFT .

## 2015-10-27 ENCOUNTER — Telehealth: Payer: Self-pay | Admitting: Pulmonary Disease

## 2015-10-27 MED ORDER — AZITHROMYCIN 250 MG PO TABS: ORAL_TABLET | ORAL | Status: AC

## 2015-10-27 NOTE — Telephone Encounter (Signed)
Spoke with pt. Reports cough, wheezing and fever for the past 2 days. Cough is producing dark yellow mucus. Denies chest tightness or SOB. Temp has been running 100-101. Has been taking Mucinex and Tylenol with minimal relief. Would like medication to be called in.  Allergies  Allergen Reactions  . Sulfonamide Derivatives     REACTION: hives   Current Outpatient Prescriptions on File Prior to Visit  Medication Sig Dispense Refill  . albuterol (PROVENTIL HFA) 108 (90 Base) MCG/ACT inhaler Inhale 2 puffs into the lungs 4 (four) times daily as needed. Reported on 09/03/2015 1 Inhaler 4  . atenolol (TENORMIN) 25 MG tablet Take 12.5 mg by mouth daily as needed (for symptoms of mitral prolapse).    . beclomethasone (QVAR) 80 MCG/ACT inhaler Inhale 2 puffs into the lungs daily.     . cyclobenzaprine (FLEXERIL) 10 MG tablet Take 5 mg by mouth at bedtime as needed. Reported on 09/03/2015    . eletriptan (RELPAX) 20 MG tablet One tablet by mouth at onset of headache. May repeat in 2 hours if headache persists or recurs. 10 tablet 0  . folic acid (FOLVITE) 1 MG tablet Take 1 mg by mouth daily.      [name] HYDROcodone-homatropine (HYCODAN) 5-1.5 MG/5ML syrup 1 tsp every 6 hours as needed for cough (may make you sleepy) (Patient not taking: Reported on 10/10/2015) 240 mL 0  . methotrexate (RHEUMATREX) 2.5 MG tablet Take 10 pills every Saturday. Caution:Chemotherapy. Protect from light.    [name] Respiratory Therapy Supplies (FLUTTER) DEVI Use as directed 1 each 0  . traMADol (ULTRAM) 50 MG tablet Take 50 mg by mouth every 6 (six) hours as needed for moderate pain. Reported on 09/03/2015    . VOLTAREN 1 % GEL Apply 2 g topically as needed (pain).      No current facility-administered medications on file prior to visit.    CY - please advise since VS in unavailable. Thanks.

## 2015-10-27 NOTE — Telephone Encounter (Signed)
Offer Zpak 

## 2015-10-27 NOTE — Telephone Encounter (Signed)
Pt is aware of CY's recommendation. Rx has been sent in. Nothing further was needed. 

## 2015-11-23 DIAGNOSIS — M25512 Pain in left shoulder: Secondary | ICD-10-CM | POA: Diagnosis not present

## 2015-11-25 DIAGNOSIS — M25512 Pain in left shoulder: Secondary | ICD-10-CM | POA: Diagnosis not present

## 2015-11-28 DIAGNOSIS — M25512 Pain in left shoulder: Secondary | ICD-10-CM | POA: Diagnosis not present

## 2015-11-29 ENCOUNTER — Ambulatory Visit: Payer: Federal, State, Local not specified - PPO | Admitting: Pulmonary Disease

## 2015-11-30 DIAGNOSIS — M25512 Pain in left shoulder: Secondary | ICD-10-CM | POA: Diagnosis not present

## 2015-12-02 ENCOUNTER — Encounter: Payer: Self-pay | Admitting: Pulmonary Disease

## 2015-12-02 ENCOUNTER — Ambulatory Visit (INDEPENDENT_AMBULATORY_CARE_PROVIDER_SITE_OTHER): Payer: Federal, State, Local not specified - PPO | Admitting: Pulmonary Disease

## 2015-12-02 VITALS — BP 124/82 | HR 92 | Ht 68.5 in | Wt 260.0 lb

## 2015-12-02 DIAGNOSIS — R059 Cough, unspecified: Secondary | ICD-10-CM

## 2015-12-02 DIAGNOSIS — M3320 Polymyositis, organ involvement unspecified: Secondary | ICD-10-CM

## 2015-12-02 DIAGNOSIS — J479 Bronchiectasis, uncomplicated: Secondary | ICD-10-CM

## 2015-12-02 DIAGNOSIS — R05 Cough: Secondary | ICD-10-CM

## 2015-12-02 LAB — PULMONARY FUNCTION TEST
DL/VA % PRED: 79 %
DL/VA: 4.19 ml/min/mmHg/L
DLCO COR: 19.46 ml/min/mmHg
DLCO UNC: 19.92 ml/min/mmHg
DLCO cor % pred: 64 %
DLCO unc % pred: 65 %
FEF 25-75 POST: 4.46 L/s
FEF 25-75 Pre: 4.32 L/sec
FEF2575-%Change-Post: 3 %
FEF2575-%Pred-Post: 160 %
FEF2575-%Pred-Pre: 155 %
FEV1-%CHANGE-POST: 0 %
FEV1-%PRED-PRE: 108 %
FEV1-%Pred-Post: 108 %
FEV1-POST: 2.96 L
FEV1-PRE: 2.97 L
FEV1FVC-%CHANGE-POST: 4 %
FEV1FVC-%Pred-Pre: 107 %
FEV6-%Change-Post: -4 %
FEV6-%PRED-PRE: 102 %
FEV6-%Pred-Post: 97 %
FEV6-POST: 3.24 L
FEV6-PRE: 3.4 L
FEV6FVC-%PRED-POST: 103 %
FEV6FVC-%Pred-Pre: 103 %
FVC-%Change-Post: -4 %
FVC-%PRED-PRE: 99 %
FVC-%Pred-Post: 94 %
FVC-PRE: 3.4 L
FVC-Post: 3.24 L
POST FEV6/FVC RATIO: 100 %
PRE FEV6/FVC RATIO: 100 %
Post FEV1/FVC ratio: 92 %
Pre FEV1/FVC ratio: 87 %
RV % PRED: 76 %
RV: 1.54 L
TLC % PRED: 86 %
TLC: 4.95 L

## 2015-12-02 NOTE — Progress Notes (Signed)
Current Outpatient Prescriptions on File Prior to Visit  Medication Sig  . albuterol (PROVENTIL HFA) 108 (90 Base) MCG/ACT inhaler Inhale 2 puffs into the lungs 4 (four) times daily as needed. Reported on 09/03/2015  . atenolol (TENORMIN) 25 MG tablet Take 12.5 mg by mouth daily as needed (for symptoms of mitral prolapse).  . beclomethasone (QVAR) 80 MCG/ACT inhaler Inhale 2 puffs into the lungs daily.   . cyclobenzaprine (FLEXERIL) 10 MG tablet Take 5 mg by mouth at bedtime as needed. Reported on 09/03/2015  . eletriptan (RELPAX) 20 MG tablet One tablet by mouth at onset of headache. May repeat in 2 hours if headache persists or recurs.  . folic acid (FOLVITE) 1 MG tablet Take 1 mg by mouth daily.    . methotrexate (RHEUMATREX) 2.5 MG tablet Take 10 pills every Saturday. Caution:Chemotherapy. Protect from light.  [name] Respiratory Therapy Supplies (FLUTTER) DEVI Use as directed  . traMADol (ULTRAM) 50 MG tablet Take 50 mg by mouth every 6 (six) hours as needed for moderate pain. Reported on 09/03/2015  . VOLTAREN 1 % GEL Apply 2 g topically as needed (pain).    No current facility-administered medications on file prior to visit.    Chief Complaint  Patient presents with  . Follow-up    Review PFT. Denies any current issues with breathing since last seen.    Tests CT chest 03/23/05 >> patchy b/l lower lung ASD with cylindrical BTX CT chest 02/17/08 >> peripheral and basilar predominant subpleural GGO CT chest 12/01/08 >> no change HRCT chest 09/07/15 >> scattered GGO  Pulmonary function testing PFT 10/30/11 >> FEV1 3.31 (114%), FEV1% 85, TLC 6.06 (108%), DLCO 77%, no BD PFT 11/18/12 >> FEV1 3.18 (111%), FEV1% 84, TLC 5.34 (95%), DLCO 67%, no BD PFT 12/02/15 >> FEV1 2.96 (108%), FEV1% 92, TLC 4.95 (86%), DLCO 65%, no BD  Past medical history MVP, HA  PSHx, Allergies, Fhx, Shx reviewed.  Vital signs BP 124/82 mmHg  Pulse 92  Ht 5' 8.5" (1.74 m)  Wt 260 lb (117.935 kg)  BMI 38.95 kg/m2   SpO2 95%  History of Present Illness: Jill Harper is a 50 y.o. female with bronchiectasis in the setting of polymyositis.  She had trouble during the winter with recurrent respiratory infections.  She is doing better recently.  She has not needed to use albuterol.  She continues to use Qvar.  She is not having sinus congestion, post-nasal drip sore throat, fever, chest pain, wheeze, or skin rash.  Physical Exam:  General - No distress ENT - No sinus tenderness, no oral exudate, no LAN Cardiac - s1s2 regular, no murmur Chest - No wheeze/rales/dullness Back - No focal tenderness Abd - Soft, non-tender Ext - No edema Neuro - Normal strength Skin - No rashes Psych - normal mood, and behavior  Assessment/Plan:  Bronchiectasis in setting of polymyositis. - continue Qvar and prn albuterol   CC: [name]  Patient Instructions  Follow up in 6 months    Chesley Mires, MD [facility] Pager:  [phone] 12/02/2015, 2:15 PM

## 2015-12-02 NOTE — Patient Instructions (Signed)
Follow up in 6 months 

## 2015-12-02 NOTE — Progress Notes (Signed)
PFT done today. 

## 2015-12-05 DIAGNOSIS — M25512 Pain in left shoulder: Secondary | ICD-10-CM | POA: Diagnosis not present

## 2015-12-09 DIAGNOSIS — M25512 Pain in left shoulder: Secondary | ICD-10-CM | POA: Diagnosis not present

## 2015-12-12 DIAGNOSIS — M25512 Pain in left shoulder: Secondary | ICD-10-CM | POA: Diagnosis not present

## 2015-12-14 DIAGNOSIS — M7532 Calcific tendinitis of left shoulder: Secondary | ICD-10-CM | POA: Diagnosis not present

## 2015-12-14 DIAGNOSIS — J479 Bronchiectasis, uncomplicated: Secondary | ICD-10-CM | POA: Diagnosis not present

## 2015-12-14 DIAGNOSIS — M3320 Polymyositis, organ involvement unspecified: Secondary | ICD-10-CM | POA: Diagnosis not present

## 2015-12-16 DIAGNOSIS — M25512 Pain in left shoulder: Secondary | ICD-10-CM | POA: Diagnosis not present

## 2015-12-20 DIAGNOSIS — M25512 Pain in left shoulder: Secondary | ICD-10-CM | POA: Diagnosis not present

## 2015-12-28 DIAGNOSIS — M25512 Pain in left shoulder: Secondary | ICD-10-CM | POA: Diagnosis not present

## 2016-01-04 DIAGNOSIS — M25512 Pain in left shoulder: Secondary | ICD-10-CM | POA: Diagnosis not present

## 2016-01-11 DIAGNOSIS — M25512 Pain in left shoulder: Secondary | ICD-10-CM | POA: Diagnosis not present

## 2016-01-18 DIAGNOSIS — M25512 Pain in left shoulder: Secondary | ICD-10-CM | POA: Diagnosis not present

## 2016-02-01 DIAGNOSIS — M25512 Pain in left shoulder: Secondary | ICD-10-CM | POA: Diagnosis not present

## 2016-02-09 DIAGNOSIS — M7532 Calcific tendinitis of left shoulder: Secondary | ICD-10-CM | POA: Diagnosis not present

## 2016-02-09 DIAGNOSIS — J479 Bronchiectasis, uncomplicated: Secondary | ICD-10-CM | POA: Diagnosis not present

## 2016-02-09 DIAGNOSIS — M3320 Polymyositis, organ involvement unspecified: Secondary | ICD-10-CM | POA: Diagnosis not present

## 2016-02-14 DIAGNOSIS — M25442 Effusion, left hand: Secondary | ICD-10-CM | POA: Diagnosis not present

## 2016-02-14 DIAGNOSIS — M25441 Effusion, right hand: Secondary | ICD-10-CM | POA: Diagnosis not present

## 2016-02-14 DIAGNOSIS — M7532 Calcific tendinitis of left shoulder: Secondary | ICD-10-CM | POA: Diagnosis not present

## 2016-02-14 DIAGNOSIS — M3320 Polymyositis, organ involvement unspecified: Secondary | ICD-10-CM | POA: Diagnosis not present

## 2016-02-15 DIAGNOSIS — M25512 Pain in left shoulder: Secondary | ICD-10-CM | POA: Diagnosis not present

## 2016-02-21 DIAGNOSIS — Z6841 Body Mass Index (BMI) 40.0 and over, adult: Secondary | ICD-10-CM | POA: Diagnosis not present

## 2016-02-21 DIAGNOSIS — Z01419 Encounter for gynecological examination (general) (routine) without abnormal findings: Secondary | ICD-10-CM | POA: Diagnosis not present

## 2016-02-21 DIAGNOSIS — Z1231 Encounter for screening mammogram for malignant neoplasm of breast: Secondary | ICD-10-CM | POA: Diagnosis not present

## 2016-02-23 LAB — HM DEXA SCAN: HM Dexa Scan: 7182017

## 2016-02-29 DIAGNOSIS — M25512 Pain in left shoulder: Secondary | ICD-10-CM | POA: Diagnosis not present

## 2016-03-20 DIAGNOSIS — Z1211 Encounter for screening for malignant neoplasm of colon: Secondary | ICD-10-CM | POA: Diagnosis not present

## 2016-03-26 ENCOUNTER — Encounter: Payer: Self-pay | Admitting: Internal Medicine

## 2016-03-26 ENCOUNTER — Ambulatory Visit (INDEPENDENT_AMBULATORY_CARE_PROVIDER_SITE_OTHER): Payer: Federal, State, Local not specified - PPO | Admitting: Internal Medicine

## 2016-03-26 VITALS — BP 122/68 | HR 92 | Temp 98.8°F | Resp 20 | Wt 268.0 lb

## 2016-03-26 DIAGNOSIS — M069 Rheumatoid arthritis, unspecified: Secondary | ICD-10-CM

## 2016-03-26 DIAGNOSIS — M7501 Adhesive capsulitis of right shoulder: Secondary | ICD-10-CM

## 2016-03-26 DIAGNOSIS — J479 Bronchiectasis, uncomplicated: Secondary | ICD-10-CM

## 2016-03-26 DIAGNOSIS — Z833 Family history of diabetes mellitus: Secondary | ICD-10-CM

## 2016-03-26 DIAGNOSIS — Z79899 Other long term (current) drug therapy: Secondary | ICD-10-CM | POA: Insufficient documentation

## 2016-03-26 DIAGNOSIS — M3320 Polymyositis, organ involvement unspecified: Secondary | ICD-10-CM

## 2016-03-26 DIAGNOSIS — I341 Nonrheumatic mitral (valve) prolapse: Secondary | ICD-10-CM

## 2016-03-26 DIAGNOSIS — G43809 Other migraine, not intractable, without status migrainosus: Secondary | ICD-10-CM

## 2016-03-26 DIAGNOSIS — Z0000 Encounter for general adult medical examination without abnormal findings: Secondary | ICD-10-CM

## 2016-03-26 DIAGNOSIS — M7500 Adhesive capsulitis of unspecified shoulder: Secondary | ICD-10-CM | POA: Insufficient documentation

## 2016-03-26 NOTE — Assessment & Plan Note (Signed)
Occasional palpitations Has not taken atenolol in over one year

## 2016-03-26 NOTE — Assessment & Plan Note (Addendum)
Taking methotrexate Uses flexeril, voltaren gel and tramadol as needed Management per rheum

## 2016-03-26 NOTE — Assessment & Plan Note (Signed)
Stressed weight loss Discussed decreased portions and exercise to help weight loss She understands the benefits of weight loss

## 2016-03-26 NOTE — Patient Instructions (Addendum)
Test(s) ordered today. Your results will be released to [facility] (or called to you) after review, usually within 72hours after test completion. If any changes need to be made, you will be notified at that same time.  All other Health Maintenance issues reviewed.   All recommended immunizations and age-appropriate screenings are up-to-date or discussed.  No immunizations administered today.   Medications reviewed and updated.  No changes recommended at this time.  Start exercising regularly.  Work on weight loss - 80% of weight loss is decreasing calorie intake.    Please followup in one year, sooner or needed   Health Maintenance, Female Adopting a healthy lifestyle and getting preventive care can go a long way to promote health and wellness. Talk with your health care provider about what schedule of regular examinations is right for you. This is a good chance for you to check in with your provider about disease prevention and staying healthy. In between checkups, there are plenty of things you can do on your own. Experts have done a lot of research about which lifestyle changes and preventive measures are most likely to keep you healthy. Ask your health care provider for more information. WEIGHT AND DIET  Eat a healthy diet  Be sure to include plenty of vegetables, fruits, low-fat dairy products, and lean protein.  Do not eat a lot of foods high in solid fats, added sugars, or salt.  Get regular exercise. This is one of the most important things you can do for your health.  Most adults should exercise for at least 150 minutes each week. The exercise should increase your heart rate and make you sweat (moderate-intensity exercise).  Most adults should also do strengthening exercises at least twice a week. This is in addition to the moderate-intensity exercise.  Maintain a healthy weight  Body mass index (BMI) is a measurement that can be used to identify possible weight problems. It  estimates body fat based on height and weight. Your health care provider can help determine your BMI and help you achieve or maintain a healthy weight.  For females 27 years of age and older:   A BMI below 18.5 is considered underweight.  A BMI of 18.5 to 24.9 is normal.  A BMI of 25 to 29.9 is considered overweight.  A BMI of 30 and above is considered obese.  Watch levels of cholesterol and blood lipids  You should start having your blood tested for lipids and cholesterol at 50 years of age, then have this test every 5 years.  You may need to have your cholesterol levels checked more often if:  Your lipid or cholesterol levels are high.  You are older than 50 years of age.  You are at high risk for heart disease.  CANCER SCREENING   Lung Cancer  Lung cancer screening is recommended for adults 50-26 years old who are at high risk for lung cancer because of a history of smoking.  A yearly low-dose CT scan of the lungs is recommended for people who:  Currently smoke.  Have quit within the past 15 years.  Have at least a 30-pack-year history of smoking. A pack year is smoking an average of one pack of cigarettes a day for 1 year.  Yearly screening should continue until it has been 15 years since you quit.  Yearly screening should stop if you develop a health problem that would prevent you from having lung cancer treatment.  Breast Cancer  Practice breast self-awareness. This  means understanding how your breasts normally appear and feel.  It also means doing regular breast self-exams. Let your health care provider know about any changes, no matter how small.  If you are in your 50s or 30s, you should have a clinical breast exam (CBE) by a health care provider every 1-3 years as part of a regular health exam.  If you are 50 or older, have a CBE every year. Also consider having a breast X-ray (mammogram) every year.  If you have a family history of breast cancer, talk  to your health care provider about genetic screening.  If you are at high risk for breast cancer, talk to your health care provider about having an MRI and a mammogram every year.  Breast cancer gene (BRCA) assessment is recommended for women who have family members with BRCA-related cancers. BRCA-related cancers include:  Breast.  Ovarian.  Tubal.  Peritoneal cancers.  Results of the assessment will determine the need for genetic counseling and BRCA1 and BRCA2 testing. Cervical Cancer Your health care provider may recommend that you be screened regularly for cancer of the pelvic organs (ovaries, uterus, and vagina). This screening involves a pelvic examination, including checking for microscopic changes to the surface of your cervix (Pap test). You may be encouraged to have this screening done every 3 years, beginning at age 50.  For women ages 50-65, health care providers may recommend pelvic exams and Pap testing every 3 years, or they may recommend the Pap and pelvic exam, combined with testing for human papilloma virus (HPV), every 5 years. Some types of HPV increase your risk of cervical cancer. Testing for HPV may also be done on women of any age with unclear Pap test results.  Other health care providers may not recommend any screening for nonpregnant women who are considered low risk for pelvic cancer and who do not have symptoms. Ask your health care provider if a screening pelvic exam is right for you.  If you have had past treatment for cervical cancer or a condition that could lead to cancer, you need Pap tests and screening for cancer for at least 20 years after your treatment. If Pap tests have been discontinued, your risk factors (such as having a new sexual partner) need to be reassessed to determine if screening should resume. Some women have medical problems that increase the chance of getting cervical cancer. In these cases, your health care provider may recommend more  frequent screening and Pap tests. Colorectal Cancer  This type of cancer can be detected and often prevented.  Routine colorectal cancer screening usually begins at 50 years of age and continues through 50 years of age.  Your health care provider may recommend screening at an earlier age if you have risk factors for colon cancer.  Your health care provider may also recommend using home test kits to check for hidden blood in the stool.  A small camera at the end of a tube can be used to examine your colon directly (sigmoidoscopy or colonoscopy). This is done to check for the earliest forms of colorectal cancer.  Routine screening usually begins at age 7.  Direct examination of the colon should be repeated every 5-10 years through 50 years of age. However, you may need to be screened more often if early forms of precancerous polyps or small growths are found. Skin Cancer  Check your skin from head to toe regularly.  Tell your health care provider about any new moles or changes  in moles, especially if there is a change in a mole's shape or color.  Also tell your health care provider if you have a mole that is larger than the size of a pencil eraser.  Always use sunscreen. Apply sunscreen liberally and repeatedly throughout the day.  Protect yourself by wearing long sleeves, pants, a wide-brimmed hat, and sunglasses whenever you are outside. HEART DISEASE, DIABETES, AND HIGH BLOOD PRESSURE   High blood pressure causes heart disease and increases the risk of stroke. High blood pressure is more likely to develop in:  People who have blood pressure in the high end of the normal range (130-139/85-89 mm Hg).  People who are overweight or obese.  People who are African American.  If you are 8-32 years of age, have your blood pressure checked every 3-5 years. If you are 12 years of age or older, have your blood pressure checked every year. You should have your blood pressure measured  twice--once when you are at a hospital or clinic, and once when you are not at a hospital or clinic. Record the average of the two measurements. To check your blood pressure when you are not at a hospital or clinic, you can use:  An automated blood pressure machine at a pharmacy.  A home blood pressure monitor.  If you are between 66 years and 20 years old, ask your health care provider if you should take aspirin to prevent strokes.  Have regular diabetes screenings. This involves taking a blood sample to check your fasting blood sugar level.  If you are at a normal weight and have a low risk for diabetes, have this test once every three years after 50 years of age.  If you are overweight and have a high risk for diabetes, consider being tested at a younger age or more often. PREVENTING INFECTION  Hepatitis B  If you have a higher risk for hepatitis B, you should be screened for this virus. You are considered at high risk for hepatitis B if:  You were born in a country where hepatitis B is common. Ask your health care provider which countries are considered high risk.  Your parents were born in a high-risk country, and you have not been immunized against hepatitis B (hepatitis B vaccine).  You have HIV or AIDS.  You use needles to inject street drugs.  You live with someone who has hepatitis B.  You have had sex with someone who has hepatitis B.  You get hemodialysis treatment.  You take certain medicines for conditions, including cancer, organ transplantation, and autoimmune conditions. Hepatitis C  Blood testing is recommended for:  Everyone born from [birth year] through 1965.  Anyone with known risk factors for hepatitis C. Sexually transmitted infections (STIs)  You should be screened for sexually transmitted infections (STIs) including gonorrhea and chlamydia if:  You are sexually active and are younger than 50 years of age.  You are older than 50 years of age and your  health care provider tells you that you are at risk for this type of infection.  Your sexual activity has changed since you were last screened and you are at an increased risk for chlamydia or gonorrhea. Ask your health care provider if you are at risk.  If you do not have HIV, but are at risk, it may be recommended that you take a prescription medicine daily to prevent HIV infection. This is called pre-exposure prophylaxis (PrEP). You are considered at risk if:  You are  sexually active and do not regularly use condoms or know the HIV status of your partner(s).  You take drugs by injection.  You are sexually active with a partner who has HIV. Talk with your health care provider about whether you are at high risk of being infected with HIV. If you choose to begin PrEP, you should first be tested for HIV. You should then be tested every 3 months for as long as you are taking PrEP.  PREGNANCY   If you are premenopausal and you may become pregnant, ask your health care provider about preconception counseling.  If you may become pregnant, take 400 to 800 micrograms (mcg) of folic acid every day.  If you want to prevent pregnancy, talk to your health care provider about birth control (contraception). OSTEOPOROSIS AND MENOPAUSE   Osteoporosis is a disease in which the bones lose minerals and strength with aging. This can result in serious bone fractures. Your risk for osteoporosis can be identified using a bone density scan.  If you are 22 years of age or older, or if you are at risk for osteoporosis and fractures, ask your health care provider if you should be screened.  Ask your health care provider whether you should take a calcium or vitamin D supplement to lower your risk for osteoporosis.  Menopause may have certain physical symptoms and risks.  Hormone replacement therapy may reduce some of these symptoms and risks. Talk to your health care provider about whether hormone replacement  therapy is right for you.  HOME CARE INSTRUCTIONS   Schedule regular health, dental, and eye exams.  Stay current with your immunizations.   Do not use any tobacco products including cigarettes, chewing tobacco, or electronic cigarettes.  If you are pregnant, do not drink alcohol.  If you are breastfeeding, limit how much and how often you drink alcohol.  Limit alcohol intake to no more than 1 drink per day for nonpregnant women. One drink equals 12 ounces of beer, 5 ounces of wine, or 1 ounces of hard liquor.  Do not use street drugs.  Do not share needles.  Ask your health care provider for help if you need support or information about quitting drugs.  Tell your health care provider if you often feel depressed.  Tell your health care provider if you have ever been abused or do not feel safe at home.   This information is not intended to replace advice given to you by your health care provider. Make sure you discuss any questions you have with your health care provider.   Document Released: 02/05/2011 Document Revised: 08/13/2014 Document Reviewed: 06/24/2013 Elsevier Interactive Patient Education [employer].

## 2016-03-26 NOTE — Assessment & Plan Note (Signed)
Doing PT, improved

## 2016-03-26 NOTE — Progress Notes (Signed)
Subjective:    Patient ID: Jill Harper, female    DOB: 09-Dec-1965, 50 y.o.   MRN: [id number]  HPI  She is here to establish with a new pcp.  She is here for follow up.  She has had multiple lung infections earlier this year and follows with pulmonary. She is bronchiectasis and has been told it takes a long time to recover. She still feels that she is not completely recovered as far as her energy level since that time. She has been following with pulmonary regularly.  She has RA, polymyositis:  She follows with rheumatology.  She is taking her medication daily as prescribed.  She states fatigue, decreased stamina and has joint pain.  She is following regularly with rheumatology and not changes in her medication were felt to be needed.   She has fatigue and is unsure if it is related to one or both of the above.  She wants to make sure nothing else is going on or causing her fatigue.   She has no other concerns.  She is trying to exercise, but it is not regular.  Her fatigue, decreased stamina and joint pain limit her exercise.    Medications and allergies reviewed with patient and updated if appropriate.  Patient Active Problem List   Diagnosis Date Noted  . Rheumatoid arthritis ([place]) 03/26/2016  . Frozen shoulder, right 03/26/2016  . Morbid obesity ([place]) 03/26/2016  . Cough 07/18/2015  . Bronchiectasis with acute exacerbation ([place]) 09/15/2014  . Neck mass 08/27/2013  . MVP (mitral valve prolapse)   . Erythema nodosum   . Throat clearing 05/07/2012  . INCONTINENCE, URGE 05/26/2010  . Migraine headache 02/15/2010  . FIBROMYALGIA 06/20/2009  . Polymyositis ([place]) 02/11/2008  . BRONCHIECTASIS 02/10/2008    Current Outpatient Prescriptions on File Prior to Visit  Medication Sig Dispense Refill  . albuterol (PROVENTIL HFA) 108 (90 Base) MCG/ACT inhaler Inhale 2 puffs into the lungs 4 (four) times daily as needed. Reported on 09/03/2015 1 Inhaler 4  . atenolol (TENORMIN) 25 MG tablet  Take 12.5 mg by mouth daily as needed (for symptoms of mitral prolapse).    . beclomethasone (QVAR) 80 MCG/ACT inhaler Inhale 2 puffs into the lungs daily.     . cyclobenzaprine (FLEXERIL) 10 MG tablet Take 5 mg by mouth at bedtime as needed. Reported on 09/03/2015    . eletriptan (RELPAX) 20 MG tablet One tablet by mouth at onset of headache. May repeat in 2 hours if headache persists or recurs. 10 tablet 0  . folic acid (FOLVITE) 1 MG tablet Take 1 mg by mouth daily.      . methotrexate (RHEUMATREX) 2.5 MG tablet Take 10 pills every Saturday. Caution:Chemotherapy. Protect from light.    [name] Respiratory Therapy Supplies (FLUTTER) DEVI Use as directed 1 each 0  . traMADol (ULTRAM) 50 MG tablet Take 50 mg by mouth every 6 (six) hours as needed for moderate pain. Reported on 09/03/2015    . VOLTAREN 1 % GEL Apply 2 g topically as needed (pain).      No current facility-administered medications on file prior to visit.     Past Medical History:  Diagnosis Date  . Abnormal liver function test   . Bronchiectasis       . Fibromyalgia   . Menorrhagia   . MIGRAINE HEADACHE   . MVP (mitral valve prolapse)   . OAB (overactive bladder)   . Polymyositis ([place])    Dr [name]; chronic  MTX    Past Surgical History:  Procedure Laterality Date  . CARDIAC CATHETERIZATION  2002   normal  . DILATION AND CURETTAGE OF UTERUS  08-31-08   Dr [name]    Social History   Social History  . Marital status: Married    Spouse name: N/A  . Number of children: N/A  . Years of education: N/A   Occupational History  . [name] in Sales executive for delivery     Social History Main Topics  . Smoking status: Never Smoker  . Smokeless tobacco: Never Used     Comment: Married, lives with spouse. 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 Alcohol use No  . Drug use: No  . Sexual activity: Not Asked   Other Topics Concern  . None   Social  History Narrative   Exercise: trying to walk - limited by fatigue    Family History  Problem Relation Age of Onset  . Diabetes Mother   . Fibromyalgia Mother   . Ulcers Mother   . Multiple myeloma Father   . Lupus Sister   . Other Sister     abdominal adhesions resulting in bowel obstruction  . Heart disease Brother   . Rheum arthritis Sister   . Hypertension Sister   . Diabetes Sister   . Hypertension Sister   . Rheum arthritis Sister   . Diabetes Sister     Review of Systems  Constitutional: Positive for fatigue. Negative for chills and fever.  Eyes: Negative for visual disturbance.  Respiratory: Positive for cough. Negative for shortness of breath and wheezing.   Cardiovascular: Negative for chest pain, palpitations and leg swelling.  Gastrointestinal: Negative for abdominal pain, blood in stool, constipation and nausea.       No gerd  Endocrine: Negative for polydipsia and polyuria.  Genitourinary: Negative for dysuria and hematuria.  Musculoskeletal: Positive for arthralgias and joint swelling (finger, wrists). Negative for myalgias.  Skin: Negative for color change and rash.  Neurological: Negative for dizziness, light-headedness and headaches.  Psychiatric/Behavioral: Negative for dysphoric mood. The patient is not nervous/anxious.        Objective:   Vitals:   03/26/16 1413  BP: 122/68  Pulse: 92  Resp: 20  Temp: 98.8 F (37.1 C)   Filed Weights   03/26/16 1413  Weight: 268 lb (121.6 kg)   Body mass index is 40.16 kg/m.   Physical Exam Constitutional: She appears well-developed and well-nourished. No distress.  HENT:  Head: Normocephalic and atraumatic.  Right Ear: External ear normal. Normal ear canal and TM Left Ear: External ear normal.  Normal ear canal and TM Mouth/Throat: Oropharynx is clear and moist.  Eyes: Conjunctivae and EOM are normal.  Neck: Neck supple. No tracheal deviation present. No thyromegaly present.  No carotid bruit    Cardiovascular: Normal rate, regular rhythm and normal heart sounds.   No murmur heard.  No edema. Pulmonary/Chest: Effort normal and breath sounds normal. No respiratory distress. She has no wheezes. She has no rales.  Breast: deferred to Gyn Abdominal: Soft. She exhibits no distension. There is no tenderness.  Lymphadenopathy: She has no cervical adenopathy.  Skin: Skin is warm and dry. She is not diaphoretic.  Psychiatric: She has a normal mood and affect. Her behavior is normal.         Assessment & Plan:   Physical exam: Screening blood work ordered Immunizations  Up to date  Colonoscopy - referred by gyn Mammogram  - Up to date  Gyn  Up to date  Eye exams - Up to date  EKG - last done in 2013, normal Exercise - exercising some, stressed more regular exercise Weight - obese - discussed weight loss - exercise and decreased portions Skin  - no concerns Substance abuse  -  No concerns  See Problem List for Assessment and Plan of chronic medical problems.   Follow up annually, sooner if needed

## 2016-03-26 NOTE — Assessment & Plan Note (Addendum)
Taking relpax as needed Has not needed relpax in months

## 2016-03-26 NOTE — Progress Notes (Signed)
Pre visit review using our clinic review tool, if applicable. No additional management support is needed unless otherwise documented below in the visit note. 

## 2016-03-26 NOTE — Assessment & Plan Note (Addendum)
Following with rhuematology Taking methotrexate, flexeril as needed

## 2016-04-04 DIAGNOSIS — M25512 Pain in left shoulder: Secondary | ICD-10-CM | POA: Diagnosis not present

## 2016-04-18 DIAGNOSIS — M7532 Calcific tendinitis of left shoulder: Secondary | ICD-10-CM | POA: Diagnosis not present

## 2016-04-18 DIAGNOSIS — M25442 Effusion, left hand: Secondary | ICD-10-CM | POA: Diagnosis not present

## 2016-04-18 DIAGNOSIS — M3320 Polymyositis, organ involvement unspecified: Secondary | ICD-10-CM | POA: Diagnosis not present

## 2016-04-18 DIAGNOSIS — M25441 Effusion, right hand: Secondary | ICD-10-CM | POA: Diagnosis not present

## 2016-04-19 ENCOUNTER — Encounter: Payer: Self-pay | Admitting: Internal Medicine

## 2016-04-20 DIAGNOSIS — K621 Rectal polyp: Secondary | ICD-10-CM | POA: Diagnosis not present

## 2016-04-20 DIAGNOSIS — D122 Benign neoplasm of ascending colon: Secondary | ICD-10-CM | POA: Diagnosis not present

## 2016-04-20 DIAGNOSIS — Z1211 Encounter for screening for malignant neoplasm of colon: Secondary | ICD-10-CM | POA: Diagnosis not present

## 2016-04-20 DIAGNOSIS — D124 Benign neoplasm of descending colon: Secondary | ICD-10-CM | POA: Diagnosis not present

## 2016-04-20 DIAGNOSIS — K635 Polyp of colon: Secondary | ICD-10-CM | POA: Diagnosis not present

## 2016-04-24 ENCOUNTER — Other Ambulatory Visit (INDEPENDENT_AMBULATORY_CARE_PROVIDER_SITE_OTHER): Payer: Federal, State, Local not specified - PPO

## 2016-04-24 DIAGNOSIS — Z0000 Encounter for general adult medical examination without abnormal findings: Secondary | ICD-10-CM

## 2016-04-24 DIAGNOSIS — Z833 Family history of diabetes mellitus: Secondary | ICD-10-CM

## 2016-04-24 LAB — LIPID PANEL
CHOL/HDL RATIO: 3
CHOLESTEROL: 110 mg/dL (ref 0–200)
HDL: 39.2 mg/dL (ref >39.00)
LDL Cholesterol: 59 mg/dL (ref 0–99)
NonHDL: 70.38
TRIGLYCERIDES: 59 mg/dL (ref 0.0–149.0)
VLDL: 11.8 mg/dL (ref 0.0–40.0)

## 2016-04-24 LAB — HEMOGLOBIN A1C: Hgb A1c MFr Bld: 4.8 % (ref 4.6–6.5)

## 2016-04-24 LAB — COMPREHENSIVE METABOLIC PANEL
ALBUMIN: 3.3 g/dL — AB (ref 3.5–5.2)
ALK PHOS: 100 U/L (ref 39–117)
ALT: 18 U/L (ref 0–35)
AST: 23 U/L (ref 0–37)
BILIRUBIN TOTAL: 0.6 mg/dL (ref 0.2–1.2)
BUN: 8 mg/dL (ref 6–23)
CALCIUM: 8.6 mg/dL (ref 8.4–10.5)
CO2: 28 mEq/L (ref 19–32)
CREATININE: 0.67 mg/dL (ref 0.40–1.20)
Chloride: 109 mEq/L (ref 96–112)
GFR: 119.6 mL/min (ref >60.00)
Glucose, Bld: 85 mg/dL (ref 70–99)
Potassium: 3.9 mEq/L (ref 3.5–5.1)
SODIUM: 141 meq/L (ref 135–145)
TOTAL PROTEIN: 6.9 g/dL (ref 6.0–8.3)

## 2016-04-24 LAB — VITAMIN D 25 HYDROXY (VIT D DEFICIENCY, FRACTURES): VITD: 10.66 ng/mL — AB (ref 30.00–100.00)

## 2016-04-24 LAB — CBC WITH DIFFERENTIAL/PLATELET
BASOS PCT: 0.7 % (ref 0.0–3.0)
Basophils Absolute: 0 10*3/uL (ref 0.0–0.1)
EOS PCT: 2.8 % (ref 0.0–5.0)
Eosinophils Absolute: 0.2 10*3/uL (ref 0.0–0.7)
HCT: 39.5 % (ref 36.0–46.0)
Hemoglobin: 13.4 g/dL (ref 12.0–15.0)
Lymphocytes Relative: 31.5 % (ref 12.0–46.0)
Lymphs Abs: 1.8 10*3/uL (ref 0.7–4.0)
MCHC: 34 g/dL (ref 30.0–36.0)
MCV: 88 fl (ref 78.0–100.0)
MONO ABS: 0.6 10*3/uL (ref 0.1–1.0)
MONOS PCT: 10 % (ref 3.0–12.0)
NEUTROS ABS: 3.1 10*3/uL (ref 1.4–7.7)
NEUTROS PCT: 55 % (ref 43.0–77.0)
PLATELETS: 206 10*3/uL (ref 150.0–400.0)
RBC: 4.49 Mil/uL (ref 3.87–5.11)
RDW: 13.5 % (ref 11.5–15.5)
WBC: 5.6 10*3/uL (ref 4.0–10.5)

## 2016-04-24 LAB — TSH: TSH: 3.88 u[IU]/mL (ref 0.35–4.50)

## 2016-04-26 ENCOUNTER — Encounter: Payer: Self-pay | Admitting: Internal Medicine

## 2016-04-26 ENCOUNTER — Other Ambulatory Visit: Payer: Self-pay | Admitting: Internal Medicine

## 2016-04-26 DIAGNOSIS — E559 Vitamin D deficiency, unspecified: Secondary | ICD-10-CM | POA: Insufficient documentation

## 2016-04-26 MED ORDER — ERGOCALCIFEROL 1.25 MG (50000 UT) PO CAPS: 50000.0000 [IU] | ORAL_CAPSULE | ORAL | 0 refills | Status: DC

## 2016-05-31 ENCOUNTER — Ambulatory Visit (INDEPENDENT_AMBULATORY_CARE_PROVIDER_SITE_OTHER): Payer: Federal, State, Local not specified - PPO | Admitting: Pulmonary Disease

## 2016-05-31 ENCOUNTER — Encounter: Payer: Self-pay | Admitting: Pulmonary Disease

## 2016-05-31 VITALS — BP 118/80 | HR 69 | Ht 68.0 in | Wt 268.6 lb

## 2016-05-31 DIAGNOSIS — J479 Bronchiectasis, uncomplicated: Secondary | ICD-10-CM | POA: Diagnosis not present

## 2016-05-31 DIAGNOSIS — Z23 Encounter for immunization: Secondary | ICD-10-CM | POA: Diagnosis not present

## 2016-05-31 DIAGNOSIS — M3320 Polymyositis, organ involvement unspecified: Secondary | ICD-10-CM | POA: Diagnosis not present

## 2016-05-31 DIAGNOSIS — M7532 Calcific tendinitis of left shoulder: Secondary | ICD-10-CM | POA: Diagnosis not present

## 2016-05-31 DIAGNOSIS — Z79899 Other long term (current) drug therapy: Secondary | ICD-10-CM | POA: Diagnosis not present

## 2016-05-31 NOTE — Patient Instructions (Signed)
Flu shot today Follow up in 6 months 

## 2016-05-31 NOTE — Progress Notes (Signed)
Current Outpatient Prescriptions on File Prior to Visit  Medication Sig  . albuterol (PROVENTIL HFA) 108 (90 Base) MCG/ACT inhaler Inhale 2 puffs into the lungs 4 (four) times daily as needed. Reported on 09/03/2015  . atenolol (TENORMIN) 25 MG tablet Take 12.5 mg by mouth daily as needed (for symptoms of mitral prolapse).  . beclomethasone (QVAR) 80 MCG/ACT inhaler Inhale 2 puffs into the lungs daily.   . cyclobenzaprine (FLEXERIL) 10 MG tablet Take 5 mg by mouth at bedtime as needed. Reported on 09/03/2015  . eletriptan (RELPAX) 20 MG tablet One tablet by mouth at onset of headache. May repeat in 2 hours if headache persists or recurs.  . ergocalciferol (VITAMIN D2) 50000 units capsule Take 1 capsule (50,000 Units total) by mouth once a week.  . folic acid (FOLVITE) 1 MG tablet Take 1 mg by mouth daily.    . methotrexate (RHEUMATREX) 2.5 MG tablet Take 10 pills every Saturday. Caution:Chemotherapy. Protect from light.  [name] Respiratory Therapy Supplies (FLUTTER) DEVI Use as directed  . traMADol (ULTRAM) 50 MG tablet Take 50 mg by mouth every 6 (six) hours as needed for moderate pain. Reported on 09/03/2015  . VOLTAREN 1 % GEL Apply 2 g topically as needed (pain).    No current facility-administered medications on file prior to visit.     Chief Complaint  Patient presents with  . Follow-up    Pt having increased cough - pt states that this is normal with the weather change this time of year. No other complaints.  Would like flu vaccine today if able.    Tests CT chest 03/23/05 >> patchy b/l lower lung ASD with cylindrical BTX CT chest 02/17/08 >> peripheral and basilar predominant subpleural GGO CT chest 12/01/08 >> no change HRCT chest 09/07/15 >> scattered GGO  Pulmonary function testing PFT 10/30/11 >> FEV1 3.31 (114%), FEV1% 85, TLC 6.06 (108%), DLCO 77%, no BD PFT 11/18/12 >> FEV1 3.18 (111%), FEV1% 84, TLC 5.34 (95%), DLCO 67%, no BD PFT 12/02/15 >> FEV1 2.96 (108%), FEV1% 92, TLC 4.95  (86%), DLCO 65%, no BD  Past medical history MVP, HA  Past surgical history, Family history, Social history, Allergies reviewed  Vital signs BP 118/80 (BP Location: Left Arm, Cuff Size: Normal)   Pulse 69   Ht 5\' 8"  (1.727 m)   Wt 268 lb 9.6 oz (121.8 kg)   SpO2 100%   BMI 40.84 kg/m   History of Present Illness: Jill Harper is a 50 y.o. female with bronchiectasis in the setting of polymyositis.  She has noticed more of a cough with change in weather.  Brings up clear sputum.  Using Qvar.  Hasn't been using albuterol.  Denies sinus congestion, sore throat, wheeze, fever, or hemoptysis.  She was started on vitamin D supplements by PCP.  Physical Exam:  General - No distress ENT - No sinus tenderness, no oral exudate, no LAN Cardiac - s1s2 regular, no murmur Chest - No wheeze/rales/dullness Back - No focal tenderness Abd - Soft, non-tender Ext - No edema Neuro - Normal strength Skin - No rashes Psych - normal mood, and behavior   CMP Latest Ref Rng & Units 04/24/2016 11/05/2012 08/28/2012  Glucose 70 - 99 mg/dL 85 95 99  BUN 6 - 23 mg/dL 8 8 10   Creatinine 0.40 - 1.20 mg/dL 0.67 0.7 0.6  Sodium 135 - 145 mEq/L 141 138 139  Potassium 3.5 - 5.1 mEq/L 3.9 4.2 4.1  Chloride 96 - 112 mEq/L 109  105 106  CO2 19 - 32 mEq/L 28 28 28   Calcium 8.4 - 10.5 mg/dL 8.6 8.8 9.1  Total Protein 6.0 - 8.3 g/dL 6.9 7.1 7.3  Total Bilirubin 0.2 - 1.2 mg/dL 0.6 0.7 0.5  Alkaline Phos 39 - 117 U/L 100 72 75  AST 0 - 37 U/L 23 23 26   ALT 0 - 35 U/L 18 23 27     CBC Latest Ref Rng & Units 04/24/2016 11/05/2012 08/28/2012  WBC 4.0 - 10.5 K/uL 5.6 6.9 7.6  Hemoglobin 12.0 - 15.0 g/dL 13.4 13.4 13.7  Hematocrit 36.0 - 46.0 % 39.5 39.1 40.6  Platelets 150.0 - 400.0 K/uL 206.0 242.0 237.0     Assessment/Plan:  Bronchiectasis in setting of polymyositis. - continue Qvar and prn albuterol   CC: Jill Harper  Patient Instructions  Flu shot today  Follow up in 6 months   Chesley Mires,  MD [facility] Pager:  [phone] 05/31/2016, 9:20 AM

## 2016-08-03 ENCOUNTER — Other Ambulatory Visit: Payer: Self-pay | Admitting: Pulmonary Disease

## 2016-08-21 DIAGNOSIS — M3320 Polymyositis, organ involvement unspecified: Secondary | ICD-10-CM | POA: Diagnosis not present

## 2016-08-21 DIAGNOSIS — M2550 Pain in unspecified joint: Secondary | ICD-10-CM | POA: Diagnosis not present

## 2016-08-21 DIAGNOSIS — M79672 Pain in left foot: Secondary | ICD-10-CM | POA: Diagnosis not present

## 2016-08-21 DIAGNOSIS — M79671 Pain in right foot: Secondary | ICD-10-CM | POA: Diagnosis not present

## 2016-08-31 ENCOUNTER — Ambulatory Visit (INDEPENDENT_AMBULATORY_CARE_PROVIDER_SITE_OTHER): Payer: Federal, State, Local not specified - PPO | Admitting: Internal Medicine

## 2016-08-31 ENCOUNTER — Encounter: Payer: Self-pay | Admitting: Internal Medicine

## 2016-08-31 VITALS — BP 138/80 | HR 105 | Temp 98.6°F | Resp 20 | Wt 261.0 lb

## 2016-08-31 DIAGNOSIS — J471 Bronchiectasis with (acute) exacerbation: Secondary | ICD-10-CM

## 2016-08-31 DIAGNOSIS — R69 Illness, unspecified: Secondary | ICD-10-CM | POA: Diagnosis not present

## 2016-08-31 DIAGNOSIS — J111 Influenza due to unidentified influenza virus with other respiratory manifestations: Secondary | ICD-10-CM

## 2016-08-31 MED ORDER — HYDROCODONE-HOMATROPINE 5-1.5 MG/5ML PO SYRP: 5.0000 mL | ORAL_SOLUTION | Freq: Four times a day (QID) | ORAL | 0 refills | Status: AC | PRN

## 2016-08-31 MED ORDER — LEVOFLOXACIN 750 MG PO TABS: 750.0000 mg | ORAL_TABLET | Freq: Every day | ORAL | 0 refills | Status: DC

## 2016-08-31 MED ORDER — OSELTAMIVIR PHOSPHATE 75 MG PO CAPS: 75.0000 mg | ORAL_CAPSULE | Freq: Two times a day (BID) | ORAL | 0 refills | Status: DC

## 2016-08-31 NOTE — Patient Instructions (Signed)
Please take all new medication as prescribed - the tamiflu and the levaquin  Please take all new medication as prescribed - the cough medicine if needed  Please continue all other medications as before, and refills have been done if requested.  Please have the pharmacy call with any other refills you may need.  Please keep your appointments with your specialists as you may have planned

## 2016-08-31 NOTE — Progress Notes (Signed)
Subjective:    Patient ID: Jill Harper, female    DOB: 1966/02/09, 51 y.o.   MRN: [id number]  HPI  Here with acute onset mild to mod 2-3 days ST, HA, general weakness and malaise, with prod cough greenish brownish sputum, but Pt denies chest pain, increased sob or doe, wheezing, orthopnea, PND, increased LE swelling, palpitations, dizziness or syncope. Today lost voice with several chills. Pt denies new neurological symptoms such as new headache, or facial or extremity weakness or numbness   Pt denies polydipsia, polyuria,  Past Medical History:  Diagnosis Date  . Abnormal liver function test   . Bronchiectasis       . Fibromyalgia   . Menorrhagia   . MIGRAINE HEADACHE   . MVP (mitral valve prolapse)   . OAB (overactive bladder)   . Polymyositis ([place])    Dr [name]; chronic MTX   Past Surgical History:  Procedure Laterality Date  . CARDIAC CATHETERIZATION  2002   normal  . DILATION AND CURETTAGE OF UTERUS  08-31-08   Dr [name]    reports that she has never smoked. She has never used smokeless tobacco. She reports that she does not drink alcohol or use drugs. family history includes Diabetes in her mother, sister, and sister; Fibromyalgia in her mother; Heart disease in her brother; Hypertension in her sister and sister; Lupus in her sister; Multiple myeloma in her father; Other in her sister; Rheum arthritis in her sister and sister; Ulcers in her mother. Allergies  Allergen Reactions  . Sulfonamide Derivatives     REACTION: hives   Current Outpatient Prescriptions on File Prior to Visit  Medication Sig Dispense Refill  . albuterol (PROVENTIL HFA) 108 (90 Base) MCG/ACT inhaler Inhale 2 puffs into the lungs 4 (four) times daily as needed. Reported on 09/03/2015 1 Inhaler 4  . atenolol (TENORMIN) 25 MG tablet Take 12.5 mg by mouth daily as needed (for symptoms of mitral prolapse).    . beclomethasone (QVAR) 80 MCG/ACT inhaler Inhale 2 puffs into the lungs daily.       . cyclobenzaprine (FLEXERIL) 10 MG tablet Take 5 mg by mouth at bedtime as needed. Reported on 09/03/2015    . eletriptan (RELPAX) 20 MG tablet One tablet by mouth at onset of headache. May repeat in 2 hours if headache persists or recurs. 10 tablet 0  . ergocalciferol (VITAMIN D2) 50000 units capsule Take 1 capsule (50,000 Units total) by mouth once a week. 8 capsule 0  . folic acid (FOLVITE) 1 MG tablet Take 1 mg by mouth daily.      . methotrexate (RHEUMATREX) 2.5 MG tablet Take 10 pills every Saturday. Caution:Chemotherapy. Protect from light.    [name] QVAR 80 MCG/ACT inhaler INHALE 2 PUFFS INTO THE LUNGS EVERY DAY 8.7 g 3  . Respiratory Therapy Supplies (FLUTTER) DEVI Use as directed 1 each 0  . traMADol (ULTRAM) 50 MG tablet Take 50 mg by mouth every 6 (six) hours as needed for moderate pain. Reported on 09/03/2015    . VOLTAREN 1 % GEL Apply 2 g topically as needed (pain).      No current facility-administered medications on file prior to visit.    Review of Systems  Constitutional: Negative for unusual diaphoresis or night sweats HENT: Negative for ear swelling or discharge Eyes: Negative for worsening visual haziness  Respiratory: Negative for choking and stridor.   Gastrointestinal: Negative for distension or worsening eructation Genitourinary: Negative for retention or change in urine  volume.  Musculoskeletal: Negative for other MSK pain or swelling Skin: Negative for color change and worsening wound Neurological: Negative for tremors and numbness other than noted  Psychiatric/Behavioral: Negative for decreased concentration or agitation other than above   All other system neg per pt    Objective:   Physical Exam BP 138/80   Pulse (!) 105   Temp 98.6 F (37 C) (Oral)   Resp 20   Wt 261 lb (118.4 kg)   SpO2 98%   BMI 39.68 kg/m  VS noted, mild ill Constitutional: Pt appears in no apparent distress HENT: Head: NCAT.  Right Ear: External ear normal.  Left Ear: External ear  normal.  Eyes: . Pupils are equal, round, and reactive to light. Conjunctivae and EOM are normal Neck: Normal range of motion. Neck supple.  Cardiovascular: Normal rate and regular rhythm.   Pulmonary/Chest: Effort normal and breath sounds decreased without rales or wheezing.  Neurological: Pt is alert. Not confused , motor grossly intact Skin: Skin is warm. No rash, no LE edema Psychiatric: Pt behavior is normal. No agitation.  No other new exam findings    Assessment & Plan:

## 2016-08-31 NOTE — Progress Notes (Signed)
Pre visit review using our clinic review tool, if applicable. No additional management support is needed unless otherwise documented below in the visit note. 

## 2016-09-01 NOTE — Assessment & Plan Note (Signed)
Mild to mod, for empiric tamiflu course,  to f/u any worsening symptoms or concerns

## 2016-09-01 NOTE — Assessment & Plan Note (Signed)
Possible exacerbation as well, Mild to mod, for antibx course,  to f/u any worsening symptoms or concerns

## 2016-09-18 ENCOUNTER — Ambulatory Visit (INDEPENDENT_AMBULATORY_CARE_PROVIDER_SITE_OTHER): Payer: Federal, State, Local not specified - PPO | Admitting: Family

## 2016-09-18 ENCOUNTER — Encounter: Payer: Self-pay | Admitting: Family

## 2016-09-18 DIAGNOSIS — H6980 Other specified disorders of Eustachian tube, unspecified ear: Secondary | ICD-10-CM | POA: Insufficient documentation

## 2016-09-18 DIAGNOSIS — H6981 Other specified disorders of Eustachian tube, right ear: Secondary | ICD-10-CM | POA: Diagnosis not present

## 2016-09-18 NOTE — Progress Notes (Signed)
Subjective:    Patient ID: Jill Harper, female    DOB: 03-03-1966, 51 y.o.   MRN: [id number]  Chief Complaint  Patient presents with  . Follow-up    following up from visit with dr. Jenny Reichmann on 08/31/16, ears are still bothering her, tender neck     HPI:  Jill Harper is a 51 y.o. female who  has a past medical history of Abnormal liver function test; Bronchiectasis; Fibromyalgia; Menorrhagia; MIGRAINE HEADACHE; MVP (mitral valve prolapse); OAB (overactive bladder); and Polymyositis ([place]). and presents today for a follow up office visit.  Recently evaluated in the office with concern for flulike symptoms and treated with a course of levofloxacin and Hycodan as well as Tamiflu. Reports taking the medication as prescribed and denies adverse side effects. Has improved since the completion of the medication. Continues to experience the associated symptoms of ear discomfort and neck tenderness. Denies fevers. Describes her ears as a "weird feeling". No discharge or pain. Family noted that she is talking louder and tender under the ears. She does also have a heavy feeling in her flank. Coughing up sputum and has noted improvements in color. Describes that she has a piece of ice located in her lower right.   Allergies  Allergen Reactions  . Sulfonamide Derivatives     REACTION: hives      Outpatient Medications Prior to Visit  Medication Sig Dispense Refill  . albuterol (PROVENTIL HFA) 108 (90 Base) MCG/ACT inhaler Inhale 2 puffs into the lungs 4 (four) times daily as needed. Reported on 09/03/2015 1 Inhaler 4  . atenolol (TENORMIN) 25 MG tablet Take 12.5 mg by mouth daily as needed (for symptoms of mitral prolapse).    . beclomethasone (QVAR) 80 MCG/ACT inhaler Inhale 2 puffs into the lungs daily.     . cyclobenzaprine (FLEXERIL) 10 MG tablet Take 5 mg by mouth at bedtime as needed. Reported on 09/03/2015    . eletriptan (RELPAX) 20 MG tablet One tablet by mouth at onset of headache. May repeat in  2 hours if headache persists or recurs. 10 tablet 0  . ergocalciferol (VITAMIN D2) 50000 units capsule Take 1 capsule (50,000 Units total) by mouth once a week. 8 capsule 0  . folic acid (FOLVITE) 1 MG tablet Take 1 mg by mouth daily.      . methotrexate (RHEUMATREX) 2.5 MG tablet Take 10 pills every Saturday. Caution:Chemotherapy. Protect from light.    [name] Respiratory Therapy Supplies (FLUTTER) DEVI Use as directed 1 each 0  . traMADol (ULTRAM) 50 MG tablet Take 50 mg by mouth every 6 (six) hours as needed for moderate pain. Reported on 09/03/2015    . VOLTAREN 1 % GEL Apply 2 g topically as needed (pain).     [name] levofloxacin (LEVAQUIN) 750 MG tablet Take 1 tablet (750 mg total) by mouth daily. 10 tablet 0  . oseltamivir (TAMIFLU) 75 MG capsule Take 1 capsule (75 mg total) by mouth 2 (two) times daily. 10 capsule 0  . QVAR 80 MCG/ACT inhaler INHALE 2 PUFFS INTO THE LUNGS EVERY DAY 8.7 g 3   No facility-administered medications prior to visit.     Review of Systems  Constitutional: Negative for chills and fever.  HENT: Negative for congestion, sinus pain, sinus pressure and sore throat.        Positive for ear fullness.  Respiratory: Positive for cough. Negative for chest tightness and shortness of breath.   Cardiovascular: Negative for chest pain, palpitations and leg  swelling.      Objective:    BP 110/80 (BP Location: Left Arm, Patient Position: Sitting, Cuff Size: Large)   Pulse 77   Temp 98.5 F (36.9 C) (Oral)   Resp 16   Ht 5\' 8"  (1.727 m)   Wt 263 lb 12.8 oz (119.7 kg)   SpO2 95%   BMI 40.11 kg/m  Nursing note and vital signs reviewed.  Physical Exam  Constitutional: She is oriented to person, place, and time. She appears well-developed and well-nourished. No distress.  HENT:  Right Ear: Hearing, tympanic membrane, external ear and ear canal normal.  Left Ear: Hearing, tympanic membrane, external ear and ear canal normal.  Nose: Nose normal. Right sinus exhibits no  maxillary sinus tenderness and no frontal sinus tenderness. Left sinus exhibits no maxillary sinus tenderness and no frontal sinus tenderness.  Mouth/Throat: Uvula is midline, oropharynx is clear and moist and mucous membranes are normal.  Cardiovascular: Normal rate, regular rhythm, normal heart sounds and intact distal pulses.   Pulmonary/Chest: Effort normal and breath sounds normal.  Neurological: She is alert and oriented to person, place, and time.  Skin: Skin is warm and dry.  Psychiatric: She has a normal mood and affect. Her behavior is normal. Judgment and thought content normal.       Assessment & Plan:   Problem List Items Addressed This Visit      Nervous and Auditory   Eustachian tube dysfunction    Symptoms and exam consistent with eustachian tube dysfunction. Treat conservatively with over-the-counter medications as needed for symptom relief and supportive care. Consider prednisone if not improved or if hearing decreases. Follow-up as needed.          I have discontinued Ms. Wageman's QVAR, oseltamivir, and levofloxacin. I am also having her maintain her cyclobenzaprine, folic acid, methotrexate, traMADol, VOLTAREN, eletriptan, atenolol, beclomethasone, FLUTTER, albuterol, and ergocalciferol.    Follow-up: Return if symptoms worsen or fail to improve.  Mauricio Po, FNP

## 2016-09-18 NOTE — Assessment & Plan Note (Signed)
Symptoms and exam consistent with eustachian tube dysfunction. Treat conservatively with over-the-counter medications as needed for symptom relief and supportive care. Consider prednisone if not improved or if hearing decreases. Follow-up as needed.

## 2016-09-18 NOTE — Patient Instructions (Addendum)
Thank you for choosing [facility].  SUMMARY AND INSTRUCTIONS:  Recommend Sudafed or Coricidin HBP and Flonase.   Medication:  Your prescription(s) have been submitted to your pharmacy or been printed and provided for you. Please take as directed and contact our office if you believe you are having problem(s) with the medication(s) or have any questions.  Follow up:  If your symptoms worsen or fail to improve, please contact our office for further instruction, or in case of emergency go directly to the emergency room at the closest medical facility.    Barotitis Media Barotitis media is inflammation of your middle ear. This occurs when the auditory tube (eustachian tube) leading from the back of your nose (nasopharynx) to your eardrum is blocked. This blockage may result from a cold, environmental allergies, or an upper respiratory infection. Unresolved barotitis media may lead to damage or hearing loss (barotrauma), which may become permanent. HOME CARE INSTRUCTIONS   Use medicines as recommended by your health care provider. Over-the-counter medicines will help unblock the canal and can help during times of air travel.  Do not put anything into your ears to clean or unplug them. Eardrops will not be helpful.  Do not swim, dive, or fly until your health care provider says it is all right to do so. If these activities are necessary, chewing gum with frequent, forceful swallowing may help. It is also helpful to hold your nose and gently blow to pop your ears for equalizing pressure changes. This forces air into the eustachian tube.  Only take over-the-counter or prescription medicines for pain, discomfort, or fever as directed by your health care provider.  A decongestant may be helpful in decongesting the middle ear and make pressure equalization easier. SEEK MEDICAL CARE IF:  You experience a serious form of dizziness in which you feel as if the room is spinning and you feel  nauseated (vertigo).  Your symptoms only involve one ear. SEEK IMMEDIATE MEDICAL CARE IF:   You develop a severe headache, dizziness, or severe ear pain.  You have bloody or pus-like drainage from your ears.  You develop a fever.  Your problems do not improve or become worse. MAKE SURE YOU:   Understand these instructions.  Will watch your condition.  Will get help right away if you are not doing well or get worse. This information is not intended to replace advice given to you by your health care provider. Make sure you discuss any questions you have with your health care provider. Document Released: 07/20/2000 Document Revised: 05/13/2013 Document Reviewed: 02/17/2013 Elsevier Interactive Patient Education  2017 [employer].

## 2016-09-28 DIAGNOSIS — M2550 Pain in unspecified joint: Secondary | ICD-10-CM | POA: Diagnosis not present

## 2016-09-28 DIAGNOSIS — Z79899 Other long term (current) drug therapy: Secondary | ICD-10-CM | POA: Diagnosis not present

## 2016-10-29 ENCOUNTER — Telehealth: Payer: Self-pay | Admitting: Internal Medicine

## 2016-10-29 DIAGNOSIS — M3320 Polymyositis, organ involvement unspecified: Secondary | ICD-10-CM

## 2016-10-29 DIAGNOSIS — M069 Rheumatoid arthritis, unspecified: Secondary | ICD-10-CM

## 2016-10-29 NOTE — Telephone Encounter (Signed)
Referral ordered

## 2016-10-29 NOTE — Telephone Encounter (Addendum)
Patient states her current rheumatologist is retiring and needs a referral to a new rheumatologist. Patient states she would like to be referred to [facility].

## 2016-11-27 ENCOUNTER — Ambulatory Visit: Payer: Federal, State, Local not specified - PPO | Admitting: Pulmonary Disease

## 2017-01-17 ENCOUNTER — Ambulatory Visit (INDEPENDENT_AMBULATORY_CARE_PROVIDER_SITE_OTHER): Payer: Federal, State, Local not specified - PPO | Admitting: Internal Medicine

## 2017-01-17 ENCOUNTER — Encounter: Payer: Self-pay | Admitting: Internal Medicine

## 2017-01-17 DIAGNOSIS — M7582 Other shoulder lesions, left shoulder: Secondary | ICD-10-CM

## 2017-01-17 DIAGNOSIS — S4381XA Sprain of other specified parts of right shoulder girdle, initial encounter: Secondary | ICD-10-CM

## 2017-01-17 DIAGNOSIS — S4380XA Sprain of other specified parts of unspecified shoulder girdle, initial encounter: Secondary | ICD-10-CM | POA: Insufficient documentation

## 2017-01-17 DIAGNOSIS — M25511 Pain in right shoulder: Secondary | ICD-10-CM | POA: Diagnosis not present

## 2017-01-17 DIAGNOSIS — M778 Other enthesopathies, not elsewhere classified: Secondary | ICD-10-CM | POA: Insufficient documentation

## 2017-01-17 NOTE — Assessment & Plan Note (Signed)
Mild to mod, cont tramadol and flexeril prn,  to f/u any worsening symptoms or concerns

## 2017-01-17 NOTE — Patient Instructions (Signed)
Please continue all other medications as before, including the tramadol and muscle relaxer  Please have the pharmacy call with any other refills you may need.  Please keep your appointments with your specialists as you may have planned  You will be contacted regarding the referral for: Dr Tamala Julian (sports medicine in this office) but you can also make an appt at the desk as you leave

## 2017-01-17 NOTE — Assessment & Plan Note (Signed)
Etiology unclear, for cont pain control as above, also refer Dr Tamala Julian r/o rot cuff dz or frozen shoulder

## 2017-01-17 NOTE — Progress Notes (Signed)
Subjective:    Patient ID: Jill Harper, female    DOB: 1966-05-09, 51 y.o.   MRN: [id number]  HPI  Here with 1 wk sharp intermittent now more constant moderate pain to right shoulder and periscapular area, then slight decreased ROM, tried some shoulder exercise she knew of from prior PT forleft shoulder, but made the pain worse, kept her up at night.  May have started with some exercise at the gym last wk but she did not think it was all that strenous.  Tramadol qhs helped the sleep, but tramadol not owrking last 2 nights, felt some "spasm" sensation to the right anterior shoulder and right upper chest. Has seen Dr Charlestine Night with left fozen shoulder before, cortisone did not help, but PT resolved the problem, No falls or trauma.  Please continue all other medications as before, and refills have been done if requested. Past Medical History:  Diagnosis Date  . Abnormal liver function test   . Bronchiectasis       . Fibromyalgia   . Menorrhagia   . MIGRAINE HEADACHE   . MVP (mitral valve prolapse)   . OAB (overactive bladder)   . Polymyositis ([employer])    Dr [name]; chronic MTX   Past Surgical History:  Procedure Laterality Date  . CARDIAC CATHETERIZATION  2002   normal  . DILATION AND CURETTAGE OF UTERUS  08-31-08   Dr [name]    reports that she has never smoked. She has never used smokeless tobacco. She reports that she does not drink alcohol or use drugs. family history includes Diabetes in her mother, sister, and sister; Fibromyalgia in her mother; Heart disease in her brother; Hypertension in her sister and sister; Lupus in her sister; Multiple myeloma in her father; Other in her sister; Rheum arthritis in her sister and sister; Ulcers in her mother. Allergies  Allergen Reactions  . Sulfonamide Derivatives     REACTION: hives   Current Outpatient Prescriptions on File Prior to Visit  Medication Sig Dispense Refill  . albuterol (PROVENTIL HFA) 108 (90 Base) MCG/ACT  inhaler Inhale 2 puffs into the lungs 4 (four) times daily as needed. Reported on 09/03/2015 1 Inhaler 4  . atenolol (TENORMIN) 25 MG tablet Take 12.5 mg by mouth daily as needed (for symptoms of mitral prolapse).    . beclomethasone (QVAR) 80 MCG/ACT inhaler Inhale 2 puffs into the lungs daily.     . cyclobenzaprine (FLEXERIL) 10 MG tablet Take 5 mg by mouth at bedtime as needed. Reported on 09/03/2015    . eletriptan (RELPAX) 20 MG tablet One tablet by mouth at onset of headache. May repeat in 2 hours if headache persists or recurs. 10 tablet 0  . ergocalciferol (VITAMIN D2) 50000 units capsule Take 1 capsule (50,000 Units total) by mouth once a week. 8 capsule 0  . folic acid (FOLVITE) 1 MG tablet Take 1 mg by mouth daily.      . methotrexate (RHEUMATREX) 2.5 MG tablet Take 10 pills every Saturday. Caution:Chemotherapy. Protect from light.    [name] Respiratory Therapy Supplies (FLUTTER) DEVI Use as directed 1 each 0  . traMADol (ULTRAM) 50 MG tablet Take 50 mg by mouth every 6 (six) hours as needed for moderate pain. Reported on 09/03/2015    . VOLTAREN 1 % GEL Apply 2 g topically as needed (pain).      No current facility-administered medications on file prior to visit.    Review of Systems  Constitutional: Negative for other  unusual diaphoresis or sweats HENT: Negative for ear discharge or swelling Eyes: Negative for other worsening visual disturbances Respiratory: Negative for stridor or other swelling  Gastrointestinal: Negative for worsening distension or other blood Genitourinary: Negative for retention or other urinary change Musculoskeletal: Negative for other MSK pain or swelling Skin: Negative for color change or other new lesions Neurological: Negative for worsening tremors and other numbness  Psychiatric/Behavioral: Negative for worsening agitation or other fatigue All other system neg per pt    Objective:   Physical Exam BP 122/86   Pulse 91   Ht _0  (1.727 m)   Wt 261 lb  (118.4 kg)   SpO2 100%   BMI 39.68 kg/m  VS noted,  Constitutional: Pt appears in NAD HENT: Head: NCAT.  Right Ear: External ear normal.  Left Ear: External ear normal.  Eyes: . Pupils are equal, round, and reactive to light. Conjunctivae and EOM are normal Nose: without d/c or deformity Neck: Neck supple. Gross normal ROM Cardiovascular: Normal rate and regular rhythm.   Pulmonary/Chest: Effort normal and breath sounds without rales or wheezing.  Right trapezoid tender with vague non discrete swelling overall to the area; Spine nontender Right shoudler NT, no swelling and mild decresaed ROM to forward elevation and lateral abduction Neurological: Pt is alert. At baseline orientation, motor grossly intact Skin: Skin is warm. No rashes, other new lesions, no LE edema Psychiatric: Pt behavior is normal without agitation  No other exam findings    Assessment & Plan:

## 2017-01-29 ENCOUNTER — Ambulatory Visit (INDEPENDENT_AMBULATORY_CARE_PROVIDER_SITE_OTHER): Payer: Federal, State, Local not specified - PPO | Admitting: Pulmonary Disease

## 2017-01-29 ENCOUNTER — Encounter: Payer: Self-pay | Admitting: Pulmonary Disease

## 2017-01-29 VITALS — BP 138/88 | HR 77 | Ht 68.0 in | Wt 263.8 lb

## 2017-01-29 DIAGNOSIS — M19042 Primary osteoarthritis, left hand: Secondary | ICD-10-CM

## 2017-01-29 DIAGNOSIS — Z23 Encounter for immunization: Secondary | ICD-10-CM | POA: Diagnosis not present

## 2017-01-29 DIAGNOSIS — M3320 Polymyositis, organ involvement unspecified: Secondary | ICD-10-CM

## 2017-01-29 DIAGNOSIS — J479 Bronchiectasis, uncomplicated: Secondary | ICD-10-CM | POA: Diagnosis not present

## 2017-01-29 DIAGNOSIS — M19041 Primary osteoarthritis, right hand: Secondary | ICD-10-CM | POA: Insufficient documentation

## 2017-01-29 DIAGNOSIS — R768 Other specified abnormal immunological findings in serum: Secondary | ICD-10-CM | POA: Insufficient documentation

## 2017-01-29 DIAGNOSIS — M19071 Primary osteoarthritis, right ankle and foot: Secondary | ICD-10-CM | POA: Insufficient documentation

## 2017-01-29 DIAGNOSIS — R5383 Other fatigue: Secondary | ICD-10-CM | POA: Insufficient documentation

## 2017-01-29 DIAGNOSIS — M19072 Primary osteoarthritis, left ankle and foot: Secondary | ICD-10-CM

## 2017-01-29 MED ORDER — PNEUMOCOCCAL 13-VAL CONJ VACC IM SUSP
0.5000 mL | Freq: Once | INTRAMUSCULAR | Status: AC
  Administered 2017-01-29: 0.5 mL via INTRAMUSCULAR

## 2017-01-29 MED ORDER — FLUTICASONE PROPIONATE HFA 44 MCG/ACT IN AERO: 2.0000 | INHALATION_SPRAY | Freq: Two times a day (BID) | RESPIRATORY_TRACT | Status: DC

## 2017-01-29 NOTE — Patient Instructions (Signed)
Flovent two puffs twice per day >> rinse mouth after each use  Prevnar shot today  Follow up in 6 months

## 2017-01-29 NOTE — Progress Notes (Signed)
Office Visit Note  Patient: Jill Harper             Date of Birth: Dec 08, 1965           MRN: [id number]             PCP: Binnie Rail, MD Referring: Binnie Rail, MD Visit Date: 01/31/2017 Occupation: _0 @    Subjective:  Joint pain   History of Present Illness: Jill Harper is a 51 y.o. female seen in consultation per request of her PCP. According to patient him in 2007 she developed upper respiratory tract infection for which she was seen by her PCP. As her PCP heard some crackles in the R long she was referred to [name]. [name] diagnosed her with bronchiectasis and also suspected autoimmune disease. Patient was experiencing some joint pain and joint swelling and stiffness in her hands and feet. She states she had intermittent swelling in her joints. In 2007 she was referred to Dr. Charlestine Night who did x-rays of her hands and some lab work and diagnosed her with possible rheumatoid arthritis. She was started on sulfasalazine. When she went for follow-up visit due to elevation of CK she had EMG test which was consistent with polymyositis per patient. She never had muscle biopsy. Her medication was switched to methotrexate that she had a reaction to sulfasalazine. She has some persistent ongoing pain and was diagnosed with fibromyalgia syndrome as well. She states over the years she has done really well on methotrexate with occasional flares approximately twice per year with increased pain and fatigue. Her bronchiectasis was also quite well-controlled but she has had recurrent infections in the last 2 years. She states she's been infection free for the last 2 months. She could not get her labs and refills on methotrexate in the transition between her rheumatologist. She started having some discomfort in her right shoulder joint. She states she had some leftover prednisone which she started taking. She was on prednisone 30 mg a day with tapering schedule. She is currently on 5 mg tablet by  mouth daily.  Activities of Daily Living:  Patient reports morning stiffness for 10 minutes.   Patient Denies nocturnal pain.  Difficulty dressing/grooming: Denies Difficulty climbing stairs: Denies Difficulty getting out of chair: Denies Difficulty using hands for taps, buttons, cutlery, and/or writing: Denies   Review of Systems  Constitutional: Positive for fatigue. Negative for night sweats, weight gain, weight loss and weakness.  HENT: Negative for mouth sores, trouble swallowing, trouble swallowing, mouth dryness and nose dryness.   Eyes: Negative for pain, redness, visual disturbance and dryness.  Respiratory: Negative for cough, shortness of breath and difficulty breathing.   Cardiovascular: Negative for chest pain, palpitations, hypertension, irregular heartbeat and swelling in legs/feet.  Gastrointestinal: Negative for blood in stool, constipation and diarrhea.  Endocrine: Negative for increased urination.  Genitourinary: Negative for vaginal dryness.  Musculoskeletal: Positive for arthralgias, joint pain, joint swelling, myalgias, morning stiffness and myalgias. Negative for muscle weakness and muscle tenderness.  Skin: Negative for color change, rash, hair loss, skin tightness, ulcers and sensitivity to sunlight.  Allergic/Immunologic: Negative for susceptible to infections.  Neurological: Negative for dizziness, memory loss and night sweats.  Hematological: Negative for swollen glands.  Psychiatric/Behavioral: Negative for depressed mood and sleep disturbance. The patient is not nervous/anxious.     PMFS History:  Patient Active Problem List   Diagnosis Date Noted  . Jo-1 antibody positive 01/29/2017  . Primary osteoarthritis of both feet  01/29/2017  . Primary osteoarthritis of both hands 01/29/2017  . Other fatigue 01/29/2017  . Trapezoid ligament sprain 01/17/2017  . Left shoulder tendonitis 01/17/2017  . Eustachian tube dysfunction 09/18/2016  . Influenza-like  illness 08/31/2016  . Vitamin D deficiency 04/26/2016  . High risk medication use 03/26/2016  . Frozen shoulder, right 03/26/2016  . Morbid obesity ([place]) 03/26/2016  . Cough 07/18/2015  . Bronchiectasis with acute exacerbation ([place]) 09/15/2014  . Neck mass 08/27/2013  . MVP (mitral valve prolapse)   . Erythema nodosum   . Throat clearing 05/07/2012  . INCONTINENCE, URGE 05/26/2010  . Migraine headache 02/15/2010  . Fibromyalgia 06/20/2009  . Polymyositis ([place]) 02/11/2008  . BRONCHIECTASIS 02/10/2008    Past Medical History:  Diagnosis Date  . Abnormal liver function test   . Bronchiectasis       . Fibromyalgia   . Menorrhagia   . MIGRAINE HEADACHE   . MVP (mitral valve prolapse)   . OAB (overactive bladder)   . Polymyositis ([place])    Dr [name]; chronic MTX    Family History  Problem Relation Age of Onset  . Diabetes Mother   . Fibromyalgia Mother   . Ulcers Mother   . Multiple myeloma Father   . Lupus Sister   . Other Sister        abdominal adhesions resulting in bowel obstruction  . Heart disease Brother   . Rheum arthritis Sister   . Hypertension Sister   . Diabetes Sister   . Hypertension Sister   . Rheum arthritis Sister   . Diabetes Sister    Past Surgical History:  Procedure Laterality Date  . CARDIAC CATHETERIZATION  2002   normal  . DILATION AND CURETTAGE OF UTERUS  08-31-08   Dr [name]   Social History   Social History Narrative   Exercise: trying to walk - limited by fatigue     Objective: Vital Signs: BP 134/86 (BP Location: Right Arm)   Pulse 82   Resp 14   Ht _0  (1.727 m)   Wt 260 lb (117.9 kg)   BMI 39.53 kg/m    Physical Exam  Constitutional: She is oriented to person, place, and time. She appears well-developed and well-nourished.  HENT:  Head: Normocephalic and atraumatic.  Eyes: Conjunctivae and EOM are normal.  Neck: Normal range of motion.  Cardiovascular: Normal rate, regular rhythm, normal heart  sounds and intact distal pulses.   Pulmonary/Chest: Effort normal and breath sounds normal.  Abdominal: Soft. Bowel sounds are normal.  Lymphadenopathy:    She has no cervical adenopathy.  Neurological: She is alert and oriented to person, place, and time.  Skin: Skin is warm and dry. Capillary refill takes less than 2 seconds.  Psychiatric: She has a normal mood and affect. Her behavior is normal.  Nursing note and vitals reviewed.    Musculoskeletal Exam: C-spine and thoracic lumbar spine good range of motion. Shoulder joints elbow joints wrist joint MCPs PIPs DIPs with good range of motion. Hip joints knee joints ankles MTPs PIPs with good range of motion. No synovitis was noted.  CDAI Exam: CDAI Homunculus Exam:   Joint Counts:  CDAI Tender Joint count: 0 CDAI Swollen Joint count: 0  Global Assessments:  Patient Global Assessment: 4 Provider Global Assessment: 0  CDAI Calculated Score: 4    Investigation: Findings:  05/31/2016 CBC CMP normal  07/14/2014 CK 51  01/30/2006 CK 801  04/15/2006 Anti Jo 1 637, ANA positive 1:80 speckled  pattern  01/07/2006  RF negative, CCP elevated 29,     Imaging: Xr Foot 2 Views Left  Result Date: 01/31/2017 PIP/DIP narrowing was noted. No MTP joint narrowing or erosive changes were noted. She has mild first MTP narrowing. Impression: Mild osteoarthritis  Xr Foot 2 Views Right  Result Date: 01/31/2017 PIP/DIP and first MTP narrowing was noted. None of the other MTPs showed joint space narrowing or erosive changes. Impression: Findings are consistent with mild osteoarthritis  Xr Hand 2 View Left  Result Date: 01/31/2017 Minimal PIP/DIP narrowing was noted. No MCP joint narrowing or erosive changes were noted. No intercarpal joint space changes were noted. Impression: Findings are consistent with mild osteoarthritis  Xr Hand 2 View Right  Result Date: 01/31/2017 Minimal PIP/DIP narrowing was noted. No MCP joint narrowing or erosive  changes were noted. No intercarpal joint space changes were noted. Impression: Findings are consistent with mild osteoarthritis   Speciality Comments: No specialty comments available.    Procedures:  No procedures performed Allergies: Sulfonamide derivatives   Assessment / Plan:     Visit Diagnoses: Polymyositis (HCC) - Diagnosed by Dr.Truslow. 01/30/2006 CK 801, Jo 1 positive, ANA 1:80 NS, RF negative, anti-CCP +29 -patient has no muscle weakness or tenderness on examination today she was also started on prednisone taper which she's been gradually reducing and currently on 5 mg a day. Plan: Urinalysis, Routine w reflex microscopic, Sedimentation rate, CK, ANA, [id number] ENA PANEL, Myositis Assessr Plus Jo-1 Autoabs  Jo-1 antibody positive per records  High risk medication use  - Methotrexate 10 tablets by mouth every week, folic acid 1 mg by mouth daily. She ran out of her medications and currently not taking any methotrexate. Indications Contraindications were discussed handout was given consent was taken my plan is to call and methotrexate and folic acid after lab results are available. - Plan: Quantiferon tb gold assay (blood), IgG, IgA, IgM, Serum protein electrophoresis with reflex, C3 and C4, HIV antibody, Hepatitis panel, acute, Serum protein electrophoresis with reflex  Pain in both hands - Plan: XR Hand 2 View Right, XR Hand 2 View Left. X-ray shows mild osteoarthritic changes  Pain in both feet - Plan: XR Foot 2 Views Right, XR Foot 2 Views Left,. X-ray showed mild osteoarthritic changes. Rheumatoid factor, Cyclic citrul peptide antibody, IgG  Bronchiectasis without complication (HCC) treated by Dr Craige Cotta  - Patient has problem with chronic bronchiectasis.   Erythema nodosum: No recent lesions.  Fibromyalgia: Fibromyalgia is a selective with generalized pain and positive tender points.  Other fatigue - Plan: CBC with Differential/Platelet, COMPLETE METABOLIC PANEL WITH GFR,  TSH  Primary osteoarthritis of both hands  Adhesive capsulitis of right shoulder: She had recent flare  Primary osteoarthritis of both feet: Proper fitting shoes discussed.  Vitamin D deficiency - Plan: VITAMIN D 25 Hydroxy (Vit-D Deficiency, Fractures)  History of mitral valve prolapse  History of migraine  Nodule of larynx treated by Dr Lazarus Salines  Family history of lupus erythematosus - In her sister    Orders: Orders Placed This Encounter  Procedures  . XR Hand 2 View Right  . XR Hand 2 View Left  . XR Foot 2 Views Right  . XR Foot 2 Views Left  . CBC with Differential/Platelet  . COMPLETE METABOLIC PANEL WITH GFR  . Urinalysis, Routine w reflex microscopic  . Sedimentation rate  . CK  . TSH  . ANA  . Rheumatoid factor  . Cyclic citrul peptide antibody, IgG  . Quantiferon  tb gold assay (blood)  . IgG, IgA, IgM  . Serum protein electrophoresis with reflex  . C3 and C4  . [id number] ENA PANEL  . HIV antibody  . Hepatitis panel, acute  . Serum protein electrophoresis with reflex  . Myositis Assessr Plus Jo-1 Autoabs  . VITAMIN D 25 Hydroxy (Vit-D Deficiency, Fractures)   Meds ordered this encounter  Medications  . diclofenac sodium (VOLTAREN) 1 % GEL    Sig: Apply 2 g topically 4 (four) times daily as needed (pain).    Dispense:  300 g    Refill:  1    Face-to-face time spent with patient was 60 minutes. 50% of time was spent in counseling and coordination of care.  Follow-Up Instructions: Return for Polymyositis, Rheumatoid arthritis fms.   Bo Merino, MD  Note - This record has been created using Editor, commissioning.  Chart creation errors have been sought, but may not always  have been located. Such creation errors do not reflect on  the standard of medical care.

## 2017-01-29 NOTE — Progress Notes (Signed)
Current Outpatient Prescriptions on File Prior to Visit  Medication Sig  . albuterol (PROVENTIL HFA) 108 (90 Base) MCG/ACT inhaler Inhale 2 puffs into the lungs 4 (four) times daily as needed. Reported on 09/03/2015  . atenolol (TENORMIN) 25 MG tablet Take 12.5 mg by mouth daily as needed (for symptoms of mitral prolapse).  . cyclobenzaprine (FLEXERIL) 10 MG tablet Take 5 mg by mouth at bedtime as needed. Reported on 09/03/2015  . eletriptan (RELPAX) 20 MG tablet One tablet by mouth at onset of headache. May repeat in 2 hours if headache persists or recurs.  . ergocalciferol (VITAMIN D2) 50000 units capsule Take 1 capsule (50,000 Units total) by mouth once a week.  . folic acid (FOLVITE) 1 MG tablet Take 1 mg by mouth daily.    . methotrexate (RHEUMATREX) 2.5 MG tablet Take 10 pills every Saturday. Caution:Chemotherapy. Protect from light.  [name] Respiratory Therapy Supplies (FLUTTER) DEVI Use as directed  . traMADol (ULTRAM) 50 MG tablet Take 50 mg by mouth every 6 (six) hours as needed for moderate pain. Reported on 09/03/2015  . VOLTAREN 1 % GEL Apply 2 g topically as needed (pain).   [name] beclomethasone (QVAR) 80 MCG/ACT inhaler Inhale 2 puffs into the lungs daily.    No current facility-administered medications on file prior to visit.     Chief Complaint  Patient presents with  . Follow-up    pt is here for 48mo follow up for cough and brnchiectasis without complication HCC. Pt is feeling better, have had to use rescue here and there for two days in last month for cough. Pt states cough is in the morning, but nothing coming up with the cough. Pt is better when on predisone currently using predisone for shoulder injury. Pt is asking about booster for pna.     Tests CT chest 03/23/05 >> patchy b/l lower lung ASD with cylindrical BTX CT chest 02/17/08 >> peripheral and basilar predominant subpleural GGO CT chest 12/01/08 >> no change HRCT chest 09/07/15 >> scattered GGO  Pulmonary function  testing PFT 10/30/11 >> FEV1 3.31 (114%), FEV1% 85, TLC 6.06 (108%), DLCO 77%, no BD PFT 11/18/12 >> FEV1 3.18 (111%), FEV1% 84, TLC 5.34 (95%), DLCO 67%, no BD PFT 12/02/15 >> FEV1 2.96 (108%), FEV1% 92, TLC 4.95 (86%), DLCO 65%, no BD  Past medical history MVP, HA  Past surgical history, Family history, Social history, Allergies reviewed  Vital signs BP 138/88 (BP Location: Left Arm, Cuff Size: Normal)   Pulse 77   Ht 5\' 8"  (1.727 m)   Wt 263 lb 12.8 oz (119.7 kg)   SpO2 99%   BMI 40.11 kg/m   History of Present Illness: Jill Harper is a 51 y.o. female with bronchiectasis in the setting of polymyositis.  She hurt her shoulder.  She has some prednisone left over and started taking this.  This has helped her breathing.  She is in the process of shifting from Dr. Charlestine Night to Dr. Estanislado Pandy for her polymyositis.  She ran out of MTX few weeks ago, and noticed her cough and breathing got worse.  She was having chest congestion with clear to yellow sputum.  She denies chest pain, fever, skin rash, or swelling.  Physical Exam:  General - pleasant Eyes - pupils reactive ENT - no sinus tenderness, no oral exudate, no LAN Cardiac - regular, no murmur Chest - no wheeze, rales Abd - soft, non tender Ext - no edema Skin - no rashes Neuro - normal strength  Psych - normal mood  Assessment/Plan:  Bronchiectasis in setting of polymyositis. - will change to Flovent - continue prn albuterol, bronchial hygiene - prevnar shot today  Polymyositis. - she will follow up with Dr. Estanislado Pandy for rheumatology and will then determine if she is resumed on MTX   Patient Instructions  Flovent two puffs twice per day >> rinse mouth after each use  Prevnar shot today  Follow up in 6 months    Chesley Mires, MD  Pulmonary/Critical Care/Sleep Pager:  [phone] 01/29/2017, 2:30 PM

## 2017-01-31 ENCOUNTER — Ambulatory Visit (INDEPENDENT_AMBULATORY_CARE_PROVIDER_SITE_OTHER): Payer: Self-pay

## 2017-01-31 ENCOUNTER — Ambulatory Visit (INDEPENDENT_AMBULATORY_CARE_PROVIDER_SITE_OTHER): Payer: Federal, State, Local not specified - PPO | Admitting: Rheumatology

## 2017-01-31 ENCOUNTER — Encounter: Payer: Self-pay | Admitting: Rheumatology

## 2017-01-31 VITALS — BP 134/86 | HR 82 | Resp 14 | Ht 68.0 in | Wt 260.0 lb

## 2017-01-31 DIAGNOSIS — M79672 Pain in left foot: Secondary | ICD-10-CM

## 2017-01-31 DIAGNOSIS — Z79899 Other long term (current) drug therapy: Secondary | ICD-10-CM | POA: Diagnosis not present

## 2017-01-31 DIAGNOSIS — M19041 Primary osteoarthritis, right hand: Secondary | ICD-10-CM

## 2017-01-31 DIAGNOSIS — J479 Bronchiectasis, uncomplicated: Secondary | ICD-10-CM

## 2017-01-31 DIAGNOSIS — M19072 Primary osteoarthritis, left ankle and foot: Secondary | ICD-10-CM

## 2017-01-31 DIAGNOSIS — R5383 Other fatigue: Secondary | ICD-10-CM | POA: Diagnosis not present

## 2017-01-31 DIAGNOSIS — M79671 Pain in right foot: Secondary | ICD-10-CM

## 2017-01-31 DIAGNOSIS — Z840 Family history of diseases of the skin and subcutaneous tissue: Secondary | ICD-10-CM

## 2017-01-31 DIAGNOSIS — M19071 Primary osteoarthritis, right ankle and foot: Secondary | ICD-10-CM

## 2017-01-31 DIAGNOSIS — Z8669 Personal history of other diseases of the nervous system and sense organs: Secondary | ICD-10-CM

## 2017-01-31 DIAGNOSIS — M19042 Primary osteoarthritis, left hand: Secondary | ICD-10-CM

## 2017-01-31 DIAGNOSIS — E559 Vitamin D deficiency, unspecified: Secondary | ICD-10-CM | POA: Diagnosis not present

## 2017-01-31 DIAGNOSIS — M79641 Pain in right hand: Secondary | ICD-10-CM

## 2017-01-31 DIAGNOSIS — J387 Other diseases of larynx: Secondary | ICD-10-CM

## 2017-01-31 DIAGNOSIS — R768 Other specified abnormal immunological findings in serum: Secondary | ICD-10-CM

## 2017-01-31 DIAGNOSIS — L52 Erythema nodosum: Secondary | ICD-10-CM

## 2017-01-31 DIAGNOSIS — M3320 Polymyositis, organ involvement unspecified: Secondary | ICD-10-CM

## 2017-01-31 DIAGNOSIS — M79642 Pain in left hand: Secondary | ICD-10-CM | POA: Diagnosis not present

## 2017-01-31 DIAGNOSIS — Z8679 Personal history of other diseases of the circulatory system: Secondary | ICD-10-CM

## 2017-01-31 DIAGNOSIS — M797 Fibromyalgia: Secondary | ICD-10-CM

## 2017-01-31 DIAGNOSIS — M7501 Adhesive capsulitis of right shoulder: Secondary | ICD-10-CM

## 2017-01-31 LAB — CBC WITH DIFFERENTIAL/PLATELET
BASOS ABS: 0 {cells}/uL (ref 0–200)
BASOS PCT: 0 %
EOS ABS: 222 {cells}/uL (ref 15–500)
Eosinophils Relative: 3 %
HCT: 42.8 % (ref 35.0–45.0)
Hemoglobin: 14.1 g/dL (ref 11.7–15.5)
LYMPHS PCT: 24 %
Lymphs Abs: 1776 cells/uL (ref 850–3900)
MCH: 28.8 pg (ref 27.0–33.0)
MCHC: 32.9 g/dL (ref 32.0–36.0)
MCV: 87.5 fL (ref 80.0–100.0)
MONO ABS: 666 {cells}/uL (ref 200–950)
MONOS PCT: 9 %
MPV: 10 fL (ref 7.5–12.5)
NEUTROS PCT: 64 %
Neutro Abs: 4736 cells/uL (ref 1500–7800)
PLATELETS: 234 10*3/uL (ref 140–400)
RBC: 4.89 MIL/uL (ref 3.80–5.10)
RDW: 12.6 % (ref 11.0–15.0)
WBC: 7.4 10*3/uL (ref 3.8–10.8)

## 2017-01-31 LAB — TSH: TSH: 4.18 m[IU]/L

## 2017-01-31 MED ORDER — DICLOFENAC SODIUM 1 % TD GEL: 2.0000 g | Freq: Four times a day (QID) | TRANSDERMAL | 1 refills | Status: DC | PRN

## 2017-01-31 NOTE — Patient Instructions (Addendum)
Standing Labs We placed an order today for your standing lab work.    Please come back and get your standing labs in 1 month then every 3 months.    We have open lab Monday through Friday from 8:30-11:30 AM and 1:30-4 PM at the office of Dr. Tresa Moore, PA.   The office is located at [address], [place], [place], [place] [address] No appointment is necessary.   Labs are drawn by [facility].  You may receive a bill from [facility] for your lab work. If you have any questions regarding directions or hours of operation,  please call [phone].    Methotrexate tablets What is this medicine? METHOTREXATE (METH oh TREX ate) is a chemotherapy drug used to treat cancer including breast cancer, leukemia, and lymphoma. This medicine can also be used to treat psoriasis and certain kinds of arthritis. This medicine may be used for other purposes; ask your health care provider or pharmacist if you have questions. COMMON BRAND NAME(S): Rheumatrex, Trexall What should I tell my health care provider before I take this medicine? They need to know if you have any of these conditions: -fluid in the stomach area or lungs -if you often drink alcohol -infection or immune system problems -kidney disease or on hemodialysis -liver disease -low blood counts, like low white cell, platelet, or red cell counts -lung disease -radiation therapy -stomach ulcers -ulcerative colitis -an unusual or allergic reaction to methotrexate, other medicines, foods, dyes, or preservatives -pregnant or trying to get pregnant -breast-feeding How should I use this medicine? Take this medicine by mouth with a glass of water. Follow the directions on the prescription label. Take your medicine at regular intervals. Do not take it more often than directed. Do not stop taking except on your doctor's advice. Make sure you know why you are taking this medicine and how often you should take it. If this medicine is  used for a condition that is not cancer, like arthritis or psoriasis, it should be taken weekly, NOT daily. Taking this medicine more often than directed can cause serious side effects, even death. Talk to your healthcare provider about safe handling and disposal of this medicine. You may need to take special precautions. Talk to your pediatrician regarding the use of this medicine in children. While this drug may be prescribed for selected conditions, precautions do apply. Overdosage: If you think you have taken too much of this medicine contact a poison control center or emergency room at once. NOTE: This medicine is only for you. Do not share this medicine with others. What if I miss a dose? If you miss a dose, talk with your doctor or health care professional. Do not take double or extra doses. What may interact with this medicine? This medicine may interact with the following medication: -acitretin -aspirin and aspirin-like medicines including salicylates -azathioprine -certain antibiotics like penicillins, tetracycline, and chloramphenicol -cyclosporine -gold -hydroxychloroquine -live virus vaccines -NSAIDs, medicines for pain and inflammation, like ibuprofen or naproxen -other cytotoxic agents -penicillamine -phenylbutazone -phenytoin -probenecid -retinoids such as isotretinoin and tretinoin -steroid medicines like prednisone or cortisone -sulfonamides like sulfasalazine and trimethoprim/sulfamethoxazole -theophylline This list may not describe all possible interactions. Give your health care provider a list of all the medicines, herbs, non-prescription drugs, or dietary supplements you use. Also tell them if you smoke, drink alcohol, or use illegal drugs. Some items may interact with your medicine. What should I watch for while using this medicine? Avoid alcoholic drinks. This medicine can  make you more sensitive to the sun. Keep out of the sun. If you cannot avoid being in the  sun, wear protective clothing and use sunscreen. Do not use sun lamps or tanning beds/booths. You may need blood work done while you are taking this medicine. Call your doctor or health care professional for advice if you get a fever, chills or sore throat, or other symptoms of a cold or flu. Do not treat yourself. This drug decreases your body's ability to fight infections. Try to avoid being around people who are sick. This medicine may increase your risk to bruise or bleed. Call your doctor or health care professional if you notice any unusual bleeding. Check with your doctor or health care professional if you get an attack of severe diarrhea, nausea and vomiting, or if you sweat a lot. The loss of too much body fluid can make it dangerous for you to take this medicine. Talk to your doctor about your risk of cancer. You may be more at risk for certain types of cancers if you take this medicine. Both men and women must use effective birth control with this medicine. Do not become pregnant while taking this medicine or until at least 1 normal menstrual cycle has occurred after stopping it. Women should inform their doctor if they wish to become pregnant or think they might be pregnant. Men should not father a child while taking this medicine and for 3 months after stopping it. There is a potential for serious side effects to an unborn child. Talk to your health care professional or pharmacist for more information. Do not breast-feed an infant while taking this medicine. What side effects may I notice from receiving this medicine? Side effects that you should report to your doctor or health care professional as soon as possible: -allergic reactions like skin rash, itching or hives, swelling of the face, lips, or tongue -breathing problems or shortness of breath -diarrhea -dry, nonproductive cough -low blood counts - this medicine may decrease the number of white blood cells, red blood cells and  platelets. You may be at increased risk for infections and bleeding. -mouth sores -redness, blistering, peeling or loosening of the skin, including inside the mouth -signs of infection - fever or chills, cough, sore throat, pain or trouble passing urine -signs and symptoms of bleeding such as bloody or black, tarry stools; red or dark-[place] urine; spitting up blood or [place] material that looks like coffee grounds; red spots on the skin; unusual bruising or bleeding from the eye, gums, or nose -signs and symptoms of kidney injury like trouble passing urine or change in the amount of urine -signs and symptoms of liver injury like dark yellow or [place]athy urine; general ill feeling or flu-like symptoms; light-colored stools; loss of appetite; nausea; right upper belly pain; unusually weak or tired; yellowing of the eyes or skin Side effects that usually do not require medical attention (report to your doctor or health care professional if they continue or are bothersome): -dizziness -hair loss -tiredness -upset stomach -vomiting This list may not describe all possible side effects. Call your doctor for medical advice about side effects. You may report side effects to FDA at 1-800-FDA-1088. Where should I keep my medicine? Keep out of the reach of children. Store at room temperature between 20 and 25 degrees C (68 and 77 degrees F). Protect from light. Throw away any unused medicine after the expiration date. NOTE: This sheet is a summary. It may not cover all  possible information. If you have questions about this medicine, talk to your doctor, pharmacist, or health care provider.  2018 Elsevier/Gold Standard (2015-03-28 05:39:22)

## 2017-01-31 NOTE — Progress Notes (Signed)
Pharmacy Note  Subjective: Patient presents today to the [facility] to see Dr. Estanislado Pandy.  Patient was previously taking methotrexate 10 tablets weekly and folic acid 1 mg daily, but she ran out of methotrexate.  Decision was made to restart patient on methotrexate once baseline labs return.  Patient seen by the pharmacist for counseling on methotrexate.    Objective: CBC    Component Value Date/Time   WBC 5.6 04/24/2016 0900   RBC 4.49 04/24/2016 0900   HGB 13.4 04/24/2016 0900   HCT 39.5 04/24/2016 0900   PLT 206.0 04/24/2016 0900   MCV 88.0 04/24/2016 0900   MCHC 34.0 04/24/2016 0900   RDW 13.5 04/24/2016 0900   LYMPHSABS 1.8 04/24/2016 0900   MONOABS 0.6 04/24/2016 0900   EOSABS 0.2 04/24/2016 0900   BASOSABS 0.0 04/24/2016 0900   CMP     Component Value Date/Time   NA 141 04/24/2016 0900   K 3.9 04/24/2016 0900   CL 109 04/24/2016 0900   CO2 28 04/24/2016 0900   GLUCOSE 85 04/24/2016 0900   BUN 8 04/24/2016 0900   CREATININE 0.67 04/24/2016 0900   CALCIUM 8.6 04/24/2016 0900   PROT 6.9 04/24/2016 0900   ALBUMIN 3.3 (L) 04/24/2016 0900   AST 23 04/24/2016 0900   ALT 18 04/24/2016 0900   ALKPHOS 100 04/24/2016 0900   BILITOT 0.6 04/24/2016 0900   GFRNONAA 139.89 07/08/2009 1117   TB Gold: ordered today Hepatitis panel: ordered today HIV: ordered today  Chest-xray:  09/21/15 "IMPRESSION: No visible focal airspace opacity.  No active disease."   PCV-13: 01/29/17   Contraception: ablation 2010  Alcohol use: none  Assessment/Plan:  Patient was counseled on the purpose, proper use, and adverse effects of methotrexate including nausea, infection, and signs and symptoms of pneumonitis.  Reviewed instructions with patient to take methotrexate weekly along with folic acid daily.  Advised patient to increase folic acid to 2 mg daily.  Discussed the importance of frequent monitoring of kidney and liver function and blood counts, and provided patient with  standing lab instructions.  Counseled patient to avoid NSAIDs and alcohol while on methotrexate.  Provided patient with educational materials on methotrexate and answered all questions.  Advised patient to get annual influenza vaccine and to get a pneumococcal vaccine if patient has not already had one.  Patient voiced understanding.  Patient consented to methotrexate use.  Will upload into chart.    Patient requested refill of diclofenac gel.  Discsussed with Dr. Estanislado Pandy who agreed to refill.    Elisabeth Most, Pharm.D., BCPS Clinical Pharmacist Pager: [phone] Phone: [phone] 01/31/2017 10:27 AM

## 2017-02-01 LAB — CK: CK TOTAL: 35 U/L (ref 29–143)

## 2017-02-01 LAB — IGG, IGA, IGM
IGM, SERUM: 136 mg/dL (ref 48–271)
IgA: 147 mg/dL (ref 81–463)
IgG (Immunoglobin G), Serum: 1992 mg/dL — ABNORMAL HIGH (ref 694–1618)

## 2017-02-01 LAB — URINALYSIS, ROUTINE W REFLEX MICROSCOPIC
Bilirubin Urine: NEGATIVE
Glucose, UA: NEGATIVE
HGB URINE DIPSTICK: NEGATIVE
KETONES UR: NEGATIVE
LEUKOCYTES UA: NEGATIVE
NITRITE: NEGATIVE
Protein, ur: NEGATIVE
SPECIFIC GRAVITY, URINE: 1.013 (ref 1.001–1.035)
pH: 6 (ref 5.0–8.0)

## 2017-02-01 LAB — HEPATITIS PANEL, ACUTE
HCV Ab: NEGATIVE
Hep A IgM: NONREACTIVE
Hep B C IgM: NONREACTIVE
Hepatitis B Surface Ag: NEGATIVE

## 2017-02-01 LAB — COMPLETE METABOLIC PANEL WITH GFR
ALT: 17 U/L (ref 6–29)
AST: 21 U/L (ref 10–35)
Albumin: 3.6 g/dL (ref 3.6–5.1)
Alkaline Phosphatase: 141 U/L — ABNORMAL HIGH (ref 33–130)
BUN: 9 mg/dL (ref 7–25)
CALCIUM: 9 mg/dL (ref 8.6–10.4)
CHLORIDE: 107 mmol/L (ref 98–110)
CO2: 22 mmol/L (ref 20–31)
CREATININE: 0.71 mg/dL (ref 0.50–1.05)
Glucose, Bld: 86 mg/dL (ref 65–99)
Potassium: 4.6 mmol/L (ref 3.5–5.3)
Sodium: 139 mmol/L (ref 135–146)
Total Bilirubin: 0.5 mg/dL (ref 0.2–1.2)
Total Protein: 7.1 g/dL (ref 6.1–8.1)

## 2017-02-01 LAB — CP5000020 ENA PANEL
ENA SM AB SER-ACNC: NEGATIVE
RIBONUCLEIC PROTEIN(ENA) ANTIBODY, IGG: NEGATIVE
SCLERODERMA (SCL-70) (ENA) ANTIBODY, IGG: NEGATIVE
SSA (Ro) (ENA) Antibody, IgG: <1
SSB (LA) (ENA) ANTIBODY, IGG: NEGATIVE
ds DNA Ab: <1 IU/mL

## 2017-02-01 LAB — ANA: Anti Nuclear Antibody(ANA): NEGATIVE

## 2017-02-01 LAB — CYCLIC CITRUL PEPTIDE ANTIBODY, IGG: CYCLIC CITRULLIN PEPTIDE AB: 213 U — AB

## 2017-02-01 LAB — C3 AND C4
C3 COMPLEMENT: 124 mg/dL (ref 83–193)
C4 Complement: 27 mg/dL (ref 15–57)

## 2017-02-01 LAB — HIV ANTIBODY (ROUTINE TESTING W REFLEX): HIV: NONREACTIVE

## 2017-02-01 LAB — SEDIMENTATION RATE: SED RATE: 12 mm/h (ref 0–30)

## 2017-02-01 LAB — RHEUMATOID FACTOR: Rhuematoid fact SerPl-aCnc: 29 IU/mL — ABNORMAL HIGH (ref <14)

## 2017-02-01 LAB — VITAMIN D 25 HYDROXY (VIT D DEFICIENCY, FRACTURES): Vit D, 25-Hydroxy: 12 ng/mL — ABNORMAL LOW (ref 30–100)

## 2017-02-01 NOTE — Progress Notes (Signed)
Vit D 50000 twice a week #90d repeat in 3 months

## 2017-02-02 LAB — QUANTIFERON TB GOLD ASSAY (BLOOD)
Interferon Gamma Release Assay: NEGATIVE
MITOGEN-NIL SO: 5.92 [IU]/mL
Quantiferon Nil Value: 0.03 IU/mL

## 2017-02-04 LAB — PROTEIN ELECTROPHORESIS, SERUM, WITH REFLEX
ALBUMIN ELP: 3.4 g/dL — AB (ref 3.8–4.8)
ALBUMIN ELP: 3.5 g/dL — AB (ref 3.8–4.8)
ALPHA-1-GLOBULIN: 0.3 g/dL (ref 0.2–0.3)
ALPHA-1-GLOBULIN: 0.3 g/dL (ref 0.2–0.3)
Alpha-2-Globulin: 0.7 g/dL (ref 0.5–0.9)
Alpha-2-Globulin: 0.7 g/dL (ref 0.5–0.9)
BETA 2: 0.3 g/dL (ref 0.2–0.5)
BETA GLOBULIN: 0.5 g/dL (ref 0.4–0.6)
Beta 2: 0.4 g/dL (ref 0.2–0.5)
Beta Globulin: 0.4 g/dL (ref 0.4–0.6)
GAMMA GLOBULIN: 1.9 g/dL — AB (ref 0.8–1.7)
Gamma Globulin: 1.8 g/dL — ABNORMAL HIGH (ref 0.8–1.7)
TOTAL PROTEIN, SERUM ELECTROPHOR: 7 g/dL (ref 6.1–8.1)
TOTAL PROTEIN, SERUM ELECTROPHOR: 7.1 g/dL (ref 6.1–8.1)

## 2017-02-11 ENCOUNTER — Telehealth: Payer: Self-pay | Admitting: Rheumatology

## 2017-02-11 DIAGNOSIS — E559 Vitamin D deficiency, unspecified: Secondary | ICD-10-CM

## 2017-02-11 MED ORDER — VITAMIN D (ERGOCALCIFEROL) 1.25 MG (50000 UNIT) PO CAPS: 50000.0000 [IU] | ORAL_CAPSULE | ORAL | 0 refills | Status: DC

## 2017-02-11 NOTE — Telephone Encounter (Signed)
Patient returned your call.

## 2017-02-11 NOTE — Telephone Encounter (Signed)
-----   Message from Bo Merino, MD sent at 02/01/2017  8:17 AM EDT ----- Vit D 50000 twice a week #90d repeat in 3 months

## 2017-02-13 ENCOUNTER — Ambulatory Visit: Payer: Federal, State, Local not specified - PPO | Admitting: Family Medicine

## 2017-02-13 ENCOUNTER — Telehealth: Payer: Self-pay | Admitting: Pulmonary Disease

## 2017-02-13 MED ORDER — FLUTICASONE PROPIONATE HFA 44 MCG/ACT IN AERO: 2.0000 | INHALATION_SPRAY | Freq: Two times a day (BID) | RESPIRATORY_TRACT | 5 refills | Status: DC

## 2017-02-13 NOTE — Telephone Encounter (Signed)
Spoke with pt, who request for Rx for Flovent to be sent to pharmacy, as pharmacy did not received Rx. Pt was instructed to start Flovent 14mcg at her OV with VS on 01/29/17. Rx was ordered as under needle and not house on 01/29/17 Rx for Flovent has been sent to preferred pharmacy. Pt is aware and voiced her understanding. Nothing further needed.

## 2017-02-14 ENCOUNTER — Other Ambulatory Visit: Payer: Self-pay | Admitting: Radiology

## 2017-02-14 ENCOUNTER — Telehealth: Payer: Self-pay | Admitting: Radiology

## 2017-02-14 MED ORDER — METHOTREXATE 2.5 MG PO TABS: 25.0000 mg | ORAL_TABLET | ORAL | 0 refills | Status: DC

## 2017-02-14 MED ORDER — FOLIC ACID 1 MG PO TABS: 2.0000 mg | ORAL_TABLET | Freq: Every day | ORAL | 3 refills | Status: DC

## 2017-02-14 NOTE — Telephone Encounter (Signed)
Sent in the MTX per plan/ per Dr Estanislado Pandy Will call patient to advise.

## 2017-02-14 NOTE — Telephone Encounter (Signed)
Ok to rx MTX

## 2017-02-14 NOTE — Telephone Encounter (Signed)
Myositis panel pending  CBC CMP are WNL   RF and CCP are positive (as expected)  Ok to send in MTX as planned or do you need to see myositis panel first ?

## 2017-02-14 NOTE — Telephone Encounter (Signed)
-----   Message from Cyndia Skeeters, [facility] sent at 01/31/2017 10:53 AM EDT ----- Methotrexate 10 tablets by mouth every week, folic acid 1 mg by mouth daily. She ran out of her medications and currently not taking any methotrexate. Indications Contraindications were discussed handout was given consent was taken my plan is to call and methotrexate and folic acid after lab results are available.

## 2017-02-15 NOTE — Telephone Encounter (Signed)
Jill Harper, I sent in the MTX yesterday per plan, will you call patient to let her know she needs Folic Acid 2mg  per day, and to resume MTX, her CBC and CMP were normal, Dr Estanislado Pandy will review the new patient labs at follow up, she should keep her follow up appt and have labs every 3 months on the MTX

## 2017-02-15 NOTE — Telephone Encounter (Signed)
Patient advised to start MTX and folic acid. Patient advised to keep follow up appointment and to have labs done in 3 months.

## 2017-02-15 NOTE — Telephone Encounter (Signed)
Attempted to contact the patient and left message for patient to call the office.  

## 2017-02-16 LAB — MYOSITIS ASSESSR PLUS JO-1 AUTOABS
EJ AUTOABS: NOT DETECTED
Jo-1 Autoabs: >8 AI — ABNORMAL HIGH
KU AUTOABS: NOT DETECTED
MI-2 AUTOABS: NOT DETECTED
OJ AUTOABS: NOT DETECTED
PL-12 AUTOABS: NOT DETECTED
PL-7 Autoabs: NOT DETECTED
SRP AUTOABS: NOT DETECTED

## 2017-02-18 NOTE — Progress Notes (Signed)
Labs are c/w PM and RA overlap. She will need CT Abdomen, Pelvis and TBBS if she never had it.

## 2017-02-21 ENCOUNTER — Telehealth: Payer: Self-pay | Admitting: *Deleted

## 2017-02-21 DIAGNOSIS — M069 Rheumatoid arthritis, unspecified: Secondary | ICD-10-CM

## 2017-02-21 DIAGNOSIS — M3320 Polymyositis, organ involvement unspecified: Secondary | ICD-10-CM

## 2017-02-21 NOTE — Telephone Encounter (Signed)
-----   Message from Bo Merino, MD sent at 02/18/2017  1:31 PM EDT ----- Labs are c/w PM and RA overlap. She will need CT Abdomen, Pelvis and TBBS if she never had it.

## 2017-02-28 ENCOUNTER — Telehealth (INDEPENDENT_AMBULATORY_CARE_PROVIDER_SITE_OTHER): Payer: Self-pay | Admitting: *Deleted

## 2017-02-28 NOTE — Progress Notes (Signed)
Office Visit Note  Patient: Jill Harper             Date of Birth: [birth year]           MRN: [id number]             PCP: Binnie Rail, MD Referring: Binnie Rail, MD Visit Date: 03/05/2017 Occupation: _0 @    Subjective:  stiffness   History of Present Illness: Jill Harper is a 51 y.o. female with history of rheumatoid arthritis and polymyositis overlap. She states she continues to do well. She has tapered off prednisone about 2 weeks ago. She's not having any increased joint pain or joint swelling. She continues to have some stiffness. There is no muscle weakness. She is seen [name] for bronchiectasis and those symptoms are stable as well.  Activities of Daily Living:  Patient reports morning stiffness for 5 minutes.   Patient Denies nocturnal pain.  Difficulty dressing/grooming: Denies Difficulty climbing stairs: Denies Difficulty getting out of chair: Denies Difficulty using hands for taps, buttons, cutlery, and/or writing: Denies   Review of Systems  Constitutional: Negative for fatigue, night sweats, weight gain, weight loss and weakness.  HENT: Negative for mouth sores, trouble swallowing, trouble swallowing, mouth dryness and nose dryness.   Eyes: Negative for pain, redness, visual disturbance and dryness.  Respiratory: Negative for cough, shortness of breath and difficulty breathing.   Cardiovascular: Negative for chest pain, palpitations, hypertension, irregular heartbeat and swelling in legs/feet.  Gastrointestinal: Negative for blood in stool, constipation and diarrhea.  Endocrine: Negative for increased urination.  Genitourinary: Negative for vaginal dryness.  Musculoskeletal: Positive for arthralgias, joint pain and morning stiffness. Negative for joint swelling, myalgias, muscle weakness, muscle tenderness and myalgias.  Skin: Negative for color change, rash, hair loss, skin tightness, ulcers and sensitivity to sunlight.  Allergic/Immunologic: Negative  for susceptible to infections.  Neurological: Negative for dizziness, memory loss and night sweats.  Hematological: Negative for swollen glands.  Psychiatric/Behavioral: Negative for depressed mood and sleep disturbance. The patient is not nervous/anxious.     PMFS History:  Patient Active Problem List   Diagnosis Date Noted  . Jo-1 antibody positive 01/29/2017  . Primary osteoarthritis of both feet 01/29/2017  . Primary osteoarthritis of both hands 01/29/2017  . Other fatigue 01/29/2017  . Trapezoid ligament sprain 01/17/2017  . Left shoulder tendonitis 01/17/2017  . Eustachian tube dysfunction 09/18/2016  . Influenza-like illness 08/31/2016  . Vitamin D deficiency 04/26/2016  . High risk medication use 03/26/2016  . Frozen shoulder, right 03/26/2016  . Morbid obesity ([place]) 03/26/2016  . Cough 07/18/2015  . Bronchiectasis with acute exacerbation ([place]) 09/15/2014  . Neck mass 08/27/2013  . MVP (mitral valve prolapse)   . Erythema nodosum   . Throat clearing 05/07/2012  . INCONTINENCE, URGE 05/26/2010  . Migraine headache 02/15/2010  . Fibromyalgia 06/20/2009  . Polymyositis ([place]) 02/11/2008  . BRONCHIECTASIS 02/10/2008    Past Medical History:  Diagnosis Date  . Abnormal liver function test   . Bronchiectasis       . Fibromyalgia   . Menorrhagia   . MIGRAINE HEADACHE   . MVP (mitral valve prolapse)   . Polymyositis ([place])    Dr [name]; chronic MTX    Family History  Problem Relation Age of Onset  . Diabetes Mother   . Fibromyalgia Mother   . Ulcers Mother   . Multiple myeloma Father   . Lupus Sister   . Other Sister  abdominal adhesions resulting in bowel obstruction  . Heart disease Brother   . Rheum arthritis Sister   . Hypertension Sister   . Diabetes Sister   . Hypertension Sister   . Rheum arthritis Sister   . Diabetes Sister    Past Surgical History:  Procedure Laterality Date  . CARDIAC CATHETERIZATION  2002   normal  . DILATION  AND CURETTAGE OF UTERUS  08-31-08   Dr [name]   Social History   Social History Narrative   Exercise: trying to walk - limited by fatigue     Objective: Vital Signs: BP 136/70 (BP Location: Left Arm, Patient Position: Sitting, Cuff Size: Normal)   Pulse 70   Ht _0  (1.727 m)   Wt 257 lb (116.6 kg)   BMI 39.08 kg/m    Physical Exam  Constitutional: She is oriented to person, place, and time. She appears well-developed and well-nourished.  HENT:  Head: Normocephalic and atraumatic.  Eyes: Conjunctivae and EOM are normal.  Neck: Normal range of motion.  Cardiovascular: Normal rate, regular rhythm, normal heart sounds and intact distal pulses.   Pulmonary/Chest: Effort normal and breath sounds normal.  Abdominal: Soft. Bowel sounds are normal.  Lymphadenopathy:    She has no cervical adenopathy.  Neurological: She is alert and oriented to person, place, and time.  Skin: Skin is warm and dry. Capillary refill takes less than 2 seconds.  Psychiatric: She has a normal mood and affect. Her behavior is normal.  Nursing note and vitals reviewed.    Musculoskeletal Exam: C-spine and thoracic lumbar spine good range of motion. Shoulder joints although joints wrist joint MCPs PIPs DIPs with good range of motion with no synovitis. Hip joints knee joints ankles MTPs PIPs DIPs are good range of motion with no synovitis. There was some muscular weakness or tenderness on examination.  CDAI Exam: CDAI Homunculus Exam:   Joint Counts:  CDAI Tender Joint count: 0 CDAI Swollen Joint count: 0  Global Assessments:  Patient Global Assessment: 4 Provider Global Assessment: 2  CDAI Calculated Score: 6    Investigation: No additional findings. 01/31/2017 CBC normal, CMP alkaline phosphatase 141, CK 35, TSH normal, ESR 12, ANA negative, RF 29, anti-CCP 213, vitamin D 12, SPEP consistent with inflammatory response, TB gold negative, IgG elevated 1992  Imaging: No results  found.  Speciality Comments: No specialty comments available.    Procedures:  No procedures performed Allergies: Sulfonamide derivatives   Assessment / Plan:     Visit Diagnoses: Rheumatoid arthritis involving multiple sites with positive rheumatoid factor (HCC) - Positive RF, positive anti-CCP. She is doing clinically well with no synovitis on examination. She's off prednisone now.  Polymyositis ([place]) - Diagnosed by Dr. Charlestine Night 2007 CK 801, Jo 1 positive, CK normal. We will continue to check her CK every 3 months.. - Plan: CK  High risk medication use - Methotrexate 10 tablets by mouth every week, folic acid 2 mg by mouth daily. Her labs were normal and she will we will continue to check her labs every 3 months.  Primary osteoarthritis of both hands - Bilateral mild: Joint protection and muscle strengthening discussed.  Primary osteoarthritis of both feet - Bilateral mild  Bronchiectasis without complication (HCC) - Chronic bronchiectasis treated by [name]  Vitamin D deficiency: She is on vitamin D 50,000 units twice a week. We will check her levels again in 3 months.  Erythema nodosum - In the past  Fibromyalgia: She does have some generalized pain  and discomfort.  Other migraine without status migrainosus, not intractable  MVP (mitral valve prolapse)    Association of heart disease with rheumatoid arthritis was discussed. Need to monitor blood pressure, cholesterol, and to exercise 30-60 minutes on daily basis was discussed. Poor dental hygiene can be a predisposing factor for rheumatoid arthritis. Good dental hygiene was discussed.  Orders: Orders Placed This Encounter  Procedures  . CK   No orders of the defined types were placed in this encounter.   Face-to-face time spent with patient was 30 minutes. Greater than 50% of time was spent in counseling and coordination of care.  Follow-Up Instructions: Return for Rheumatoid arthritis Polymyositis.   Bo Merino, MD  Note - This record has been created using Editor, commissioning.  Chart creation errors have been sought, but may not always  have been located. Such creation errors do not reflect on  the standard of medical care.

## 2017-02-28 NOTE — Telephone Encounter (Signed)
Pt has appt at [facility] Radiology Dept on Fri Aug 3 starting at 11:00 am for Bone Scan injection and back at 2pm for imaging, pt is also scheduled in between those tiimes for CT Abdomen and Pelvis at 11:30am, pt is to pick up contrast at [facility] and start drinking at 9:30am and 10:30am and this exam will start at 11:30am, is to be NPO 4 hrs before.   Called and left message on pt vm to return my call for appt information

## 2017-03-05 ENCOUNTER — Encounter: Payer: Self-pay | Admitting: Rheumatology

## 2017-03-05 ENCOUNTER — Ambulatory Visit (INDEPENDENT_AMBULATORY_CARE_PROVIDER_SITE_OTHER): Payer: Federal, State, Local not specified - PPO | Admitting: Rheumatology

## 2017-03-05 VITALS — BP 136/70 | HR 70 | Ht 68.0 in | Wt 257.0 lb

## 2017-03-05 DIAGNOSIS — M0579 Rheumatoid arthritis with rheumatoid factor of multiple sites without organ or systems involvement: Secondary | ICD-10-CM

## 2017-03-05 DIAGNOSIS — I341 Nonrheumatic mitral (valve) prolapse: Secondary | ICD-10-CM

## 2017-03-05 DIAGNOSIS — M3320 Polymyositis, organ involvement unspecified: Secondary | ICD-10-CM | POA: Diagnosis not present

## 2017-03-05 DIAGNOSIS — G43809 Other migraine, not intractable, without status migrainosus: Secondary | ICD-10-CM | POA: Diagnosis not present

## 2017-03-05 DIAGNOSIS — Z79899 Other long term (current) drug therapy: Secondary | ICD-10-CM

## 2017-03-05 DIAGNOSIS — L52 Erythema nodosum: Secondary | ICD-10-CM

## 2017-03-05 DIAGNOSIS — J479 Bronchiectasis, uncomplicated: Secondary | ICD-10-CM

## 2017-03-05 DIAGNOSIS — M19071 Primary osteoarthritis, right ankle and foot: Secondary | ICD-10-CM | POA: Diagnosis not present

## 2017-03-05 DIAGNOSIS — M797 Fibromyalgia: Secondary | ICD-10-CM

## 2017-03-05 DIAGNOSIS — E559 Vitamin D deficiency, unspecified: Secondary | ICD-10-CM | POA: Diagnosis not present

## 2017-03-05 DIAGNOSIS — M19041 Primary osteoarthritis, right hand: Secondary | ICD-10-CM

## 2017-03-05 DIAGNOSIS — M19042 Primary osteoarthritis, left hand: Secondary | ICD-10-CM

## 2017-03-05 DIAGNOSIS — M19072 Primary osteoarthritis, left ankle and foot: Secondary | ICD-10-CM

## 2017-03-05 NOTE — Patient Instructions (Addendum)
Standing Labs We placed an order today for your standing lab work.    Please come back and get your standing labs in September and every 3 months with CK  We have open lab Monday through Friday from 8:30-11:30 AM and 1:30-4 PM at the office of Dr. Bo Merino.   The office is located at [address] [place], [place], [place] [address] No appointment is necessary.   Labs are drawn by [facility].  You may receive a bill from [facility] for your lab work. If you have any questions regarding directions or hours of operation,  please call [phone].     Association of heart disease with rheumatoid arthritis was discussed. Need to monitor blood pressure, cholesterol, and to exercise 30-60 minutes on daily basis was discussed. Poor dental hygiene can be a predisposing factor for rheumatoid arthritis. Good dental hygiene was discussed.

## 2017-03-08 ENCOUNTER — Encounter (HOSPITAL_COMMUNITY)
Admission: RE | Admit: 2017-03-08 | Discharge: 2017-03-08 | Disposition: A | Discharge status: Home / Self Care | Payer: Federal, State, Local not specified - PPO | Source: Ambulatory Visit | Attending: Rheumatology | Admitting: Rheumatology

## 2017-03-08 ENCOUNTER — Ambulatory Visit (HOSPITAL_COMMUNITY)
Admission: RE | Admit: 2017-03-08 | Discharge: 2017-03-08 | Disposition: A | Discharge status: Home / Self Care | Payer: Federal, State, Local not specified - PPO | Source: Ambulatory Visit | Attending: Rheumatology | Admitting: Rheumatology

## 2017-03-08 DIAGNOSIS — K439 Ventral hernia without obstruction or gangrene: Secondary | ICD-10-CM | POA: Insufficient documentation

## 2017-03-08 DIAGNOSIS — M3320 Polymyositis, organ involvement unspecified: Secondary | ICD-10-CM | POA: Insufficient documentation

## 2017-03-08 DIAGNOSIS — M069 Rheumatoid arthritis, unspecified: Secondary | ICD-10-CM | POA: Insufficient documentation

## 2017-03-08 DIAGNOSIS — J479 Bronchiectasis, uncomplicated: Secondary | ICD-10-CM | POA: Diagnosis not present

## 2017-03-08 DIAGNOSIS — J8410 Pulmonary fibrosis, unspecified: Secondary | ICD-10-CM | POA: Insufficient documentation

## 2017-03-08 DIAGNOSIS — K449 Diaphragmatic hernia without obstruction or gangrene: Secondary | ICD-10-CM | POA: Diagnosis not present

## 2017-03-08 DIAGNOSIS — M19079 Primary osteoarthritis, unspecified ankle and foot: Secondary | ICD-10-CM | POA: Diagnosis not present

## 2017-03-08 DIAGNOSIS — R1084 Generalized abdominal pain: Secondary | ICD-10-CM | POA: Diagnosis not present

## 2017-03-08 DIAGNOSIS — M19019 Primary osteoarthritis, unspecified shoulder: Secondary | ICD-10-CM | POA: Diagnosis not present

## 2017-03-08 MED ORDER — TECHNETIUM TC 99M MEDRONATE IV KIT
22.0000 | PACK | Freq: Once | INTRAVENOUS | Status: AC | PRN
  Administered 2017-03-08: 22 via INTRAVENOUS

## 2017-03-08 MED ORDER — IOPAMIDOL (ISOVUE-300) INJECTION 61%
30.0000 mL | Freq: Once | INTRAVENOUS | Status: AC | PRN
  Administered 2017-03-08: 30 mL via ORAL

## 2017-03-08 MED ORDER — IOPAMIDOL (ISOVUE-300) INJECTION 61%
INTRAVENOUS | Status: AC
  Filled 2017-03-08: qty 30

## 2017-03-08 NOTE — Progress Notes (Signed)
CT Abd and Pelvis normal. Please, forward it to [name].

## 2017-03-10 NOTE — Progress Notes (Signed)
TBBS normal.

## 2017-03-28 DIAGNOSIS — Z01419 Encounter for gynecological examination (general) (routine) without abnormal findings: Secondary | ICD-10-CM | POA: Diagnosis not present

## 2017-03-28 DIAGNOSIS — Z6841 Body Mass Index (BMI) 40.0 and over, adult: Secondary | ICD-10-CM | POA: Diagnosis not present

## 2017-03-28 DIAGNOSIS — Z1231 Encounter for screening mammogram for malignant neoplasm of breast: Secondary | ICD-10-CM | POA: Diagnosis not present

## 2017-03-28 DIAGNOSIS — Z13820 Encounter for screening for osteoporosis: Secondary | ICD-10-CM | POA: Diagnosis not present

## 2017-04-05 ENCOUNTER — Ambulatory Visit (INDEPENDENT_AMBULATORY_CARE_PROVIDER_SITE_OTHER): Payer: Federal, State, Local not specified - PPO | Admitting: Internal Medicine

## 2017-04-05 VITALS — BP 108/82 | HR 73 | Temp 98.7°F | Ht 68.0 in | Wt 262.0 lb

## 2017-04-05 DIAGNOSIS — R059 Cough, unspecified: Secondary | ICD-10-CM

## 2017-04-05 DIAGNOSIS — Z23 Encounter for immunization: Secondary | ICD-10-CM

## 2017-04-05 DIAGNOSIS — R05 Cough: Secondary | ICD-10-CM | POA: Diagnosis not present

## 2017-04-05 DIAGNOSIS — R062 Wheezing: Secondary | ICD-10-CM | POA: Diagnosis not present

## 2017-04-05 MED ORDER — LEVOFLOXACIN 500 MG PO TABS: 500.0000 mg | ORAL_TABLET | Freq: Every day | ORAL | 0 refills | Status: AC

## 2017-04-05 MED ORDER — PREDNISONE 10 MG PO TABS: 20.0000 mg | ORAL_TABLET | Freq: Every day | ORAL | 0 refills | Status: AC

## 2017-04-05 NOTE — Assessment & Plan Note (Signed)
C/w bronchiectasis/bronchitis flare vs pna, declines cxr, ok for levaquin asd, and delsym otc prn cough

## 2017-04-05 NOTE — Patient Instructions (Signed)
Please take all new medication as prescribed  - the antibiotic, and prednisone  You can also take Delsym OTC for cough, and/or Mucinex (or it's generic off brand) for congestion, and tylenol as needed for pain.  Please continue all other medications as before, and refills have been done if requested.  Please have the pharmacy call with any other refills you may need.  Please keep your appointments with your specialists as you may have planned

## 2017-04-05 NOTE — Progress Notes (Signed)
Subjective:    Patient ID: Jill Harper, female    DOB: 1966/07/02, 51 y.o.   MRN: [id number]  HPI  Here with acute onset mild to mod 2-3 days ST, HA, general weakness and malaise, with prod cough yellow greenish sputum, assoc with mild worsening sob and wheeziness with increased inhaler use, but Pt denies chest pain,  orthopnea, PND, increased LE swelling, palpitations, dizziness or syncope.  Has hx of bronchiectasis per pulm, though not noted on CT chest 2010 and 2017.  + PNA by CT feb 2017.  Has been followed closely per Dr Halford Chessman, most recently in June 2018 with recommendation for flovent added, and prevnar given.   Past Medical History:  Diagnosis Date  . Abnormal liver function test   . Bronchiectasis       . Fibromyalgia   . Menorrhagia   . MIGRAINE HEADACHE   . MVP (mitral valve prolapse)   . Polymyositis ([place])    Dr [name]; chronic MTX   Past Surgical History:  Procedure Laterality Date  . CARDIAC CATHETERIZATION  2002   normal  . DILATION AND CURETTAGE OF UTERUS  08-31-08   Dr [name]    reports that she has never smoked. She has never used smokeless tobacco. She reports that she does not drink alcohol or use drugs. family history includes Diabetes in her mother, sister, and sister; Fibromyalgia in her mother; Heart disease in her brother; Hypertension in her sister and sister; Lupus in her sister; Multiple myeloma in her father; Other in her sister; Rheum arthritis in her sister and sister; Ulcers in her mother. Allergies  Allergen Reactions  . Sulfonamide Derivatives     REACTION: hives   Current Outpatient Prescriptions on File Prior to Visit  Medication Sig Dispense Refill  . albuterol (PROVENTIL HFA) 108 (90 Base) MCG/ACT inhaler Inhale 2 puffs into the lungs 4 (four) times daily as needed. Reported on 09/03/2015 1 Inhaler 4  . atenolol (TENORMIN) 25 MG tablet Take 12.5 mg by mouth daily as needed (for symptoms of mitral prolapse).    . cyclobenzaprine  (FLEXERIL) 10 MG tablet Take 5 mg by mouth at bedtime as needed. Reported on 09/03/2015    . diclofenac sodium (VOLTAREN) 1 % GEL Apply 2 g topically 4 (four) times daily as needed (pain). 300 g 1  . eletriptan (RELPAX) 20 MG tablet One tablet by mouth at onset of headache. May repeat in 2 hours if headache persists or recurs. 10 tablet 0  . fluticasone (FLOVENT HFA) 44 MCG/ACT inhaler Inhale 2 puffs into the lungs 2 (two) times daily. 1 Inhaler 5  . folic acid (FOLVITE) 1 MG tablet Take 2 tablets (2 mg total) by mouth daily. 180 tablet 3  . methotrexate (RHEUMATREX) 2.5 MG tablet Take 10 tablets (25 mg total) by mouth once a week. Caution:Chemotherapy. Protect from light. 120 tablet 0  . Respiratory Therapy Supplies (FLUTTER) DEVI Use as directed 1 each 0  . traMADol (ULTRAM) 50 MG tablet Take 50 mg by mouth every 6 (six) hours as needed for moderate pain. Reported on 09/03/2015    . Vitamin D, Ergocalciferol, (DRISDOL) 50000 units CAPS capsule Take 1 capsule (50,000 Units total) by mouth 2 (two) times a week. 24 capsule 0   Current Facility-Administered Medications on File Prior to Visit  Medication Dose Route Frequency Provider Last Rate Last Dose  . fluticasone (FLOVENT HFA) 44 MCG/ACT inhaler 2 puff  2 puff Inhalation BID Chesley Mires, MD  Review of Systems  Constitutional: Negative for other unusual diaphoresis or sweats HENT: Negative for ear discharge or swelling Eyes: Negative for other worsening visual disturbances Respiratory: Negative for stridor or other swelling  Gastrointestinal: Negative for worsening distension or other blood Genitourinary: Negative for retention or other urinary change Musculoskeletal: Negative for other MSK pain or swelling Skin: Negative for color change or other new lesions Neurological: Negative for worsening tremors and other numbness  Psychiatric/Behavioral: Negative for worsening agitation or other fatigue All other system neg per pt      Objective:   Physical Exam BP 108/82   Pulse 73   Temp 98.7 F (37.1 C) (Oral)   Ht _0  (1.727 m)   Wt 262 lb (118.8 kg)   SpO2 100%   BMI 39.84 kg/m  VS noted, mild ill Constitutional: Pt appears in NAD HENT: Head: NCAT.  Right Ear: External ear normal.  Left Ear: External ear normal.  Eyes: . Pupils are equal, round, and reactive to light. Conjunctivae and EOM are normal Bilat tm's with mild erythema.  Max sinus areas non tender.  Pharynx with mild erythema, no exudate  Nose: without d/c or deformity Neck: Neck supple. Gross normal ROM Cardiovascular: Normal rate and regular rhythm.   Pulmonary/Chest: Effort normal and breath sounds without rales but few scattered wheezing.  Neurological: Pt is alert. At baseline orientation, motor grossly intact Skin: Skin is warm. No rashes, other new lesions, no LE edema Psychiatric: Pt behavior is normal without agitation  No other exam findings    Assessment & Plan:

## 2017-04-05 NOTE — Assessment & Plan Note (Signed)
Very mild, to cont in halers, also prednisone 20 qd x 5 days,  to f/u any worsening symptoms or concerns

## 2017-05-07 ENCOUNTER — Other Ambulatory Visit: Payer: Self-pay | Admitting: *Deleted

## 2017-05-07 ENCOUNTER — Other Ambulatory Visit: Payer: Self-pay | Admitting: Rheumatology

## 2017-05-07 DIAGNOSIS — Z79899 Other long term (current) drug therapy: Secondary | ICD-10-CM | POA: Diagnosis not present

## 2017-05-07 DIAGNOSIS — E559 Vitamin D deficiency, unspecified: Secondary | ICD-10-CM

## 2017-05-07 DIAGNOSIS — M3320 Polymyositis, organ involvement unspecified: Secondary | ICD-10-CM | POA: Diagnosis not present

## 2017-05-08 ENCOUNTER — Telehealth: Payer: Self-pay | Admitting: *Deleted

## 2017-05-08 DIAGNOSIS — M2550 Pain in unspecified joint: Secondary | ICD-10-CM

## 2017-05-08 DIAGNOSIS — E559 Vitamin D deficiency, unspecified: Secondary | ICD-10-CM

## 2017-05-08 LAB — CBC WITH DIFFERENTIAL/PLATELET
BASOS PCT: 0.3 %
Basophils Absolute: 12 cells/uL (ref 0–200)
EOS PCT: 3 %
Eosinophils Absolute: 120 cells/uL (ref 15–500)
HEMATOCRIT: 40.7 % (ref 35.0–45.0)
Hemoglobin: 13.7 g/dL (ref 11.7–15.5)
LYMPHS ABS: 1204 {cells}/uL (ref 850–3900)
MCH: 29.5 pg (ref 27.0–33.0)
MCHC: 33.7 g/dL (ref 32.0–36.0)
MCV: 87.7 fL (ref 80.0–100.0)
MPV: 11.3 fL (ref 7.5–12.5)
Monocytes Relative: 6.6 %
NEUTROS ABS: 2400 {cells}/uL (ref 1500–7800)
NEUTROS PCT: 60 %
Platelets: 229 10*3/uL (ref 140–400)
RBC: 4.64 10*6/uL (ref 3.80–5.10)
RDW: 13.8 % (ref 11.0–15.0)
Total Lymphocyte: 30.1 %
WBC mixed population: 264 cells/uL (ref 200–950)
WBC: 4 10*3/uL (ref 3.8–10.8)

## 2017-05-08 LAB — COMPLETE METABOLIC PANEL WITH GFR
AG Ratio: 1.1 (calc) (ref 1.0–2.5)
ALT: 53 U/L — AB (ref 6–29)
AST: 56 U/L — AB (ref 10–35)
Albumin: 3.7 g/dL (ref 3.6–5.1)
Alkaline phosphatase (APISO): 96 U/L (ref 33–130)
BUN: 9 mg/dL (ref 7–25)
CALCIUM: 9.4 mg/dL (ref 8.6–10.4)
CO2: 26 mmol/L (ref 20–32)
CREATININE: 0.76 mg/dL (ref 0.50–1.05)
Chloride: 108 mmol/L (ref 98–110)
GFR, EST NON AFRICAN AMERICAN: 91 mL/min/{1.73_m2} (ref >=60)
GFR, Est African American: 105 mL/min/{1.73_m2} (ref >=60)
GLOBULIN: 3.3 g/dL (ref 1.9–3.7)
Glucose, Bld: 102 mg/dL — ABNORMAL HIGH (ref 65–99)
Potassium: 4.7 mmol/L (ref 3.5–5.3)
Sodium: 140 mmol/L (ref 135–146)
Total Bilirubin: 0.7 mg/dL (ref 0.2–1.2)
Total Protein: 7 g/dL (ref 6.1–8.1)

## 2017-05-08 LAB — VITAMIN D 25 HYDROXY (VIT D DEFICIENCY, FRACTURES): Vit D, 25-Hydroxy: 20 ng/mL — ABNORMAL LOW (ref 30–100)

## 2017-05-08 LAB — CK: Total CK: 45 U/L (ref 29–143)

## 2017-05-08 MED ORDER — VITAMIN D (ERGOCALCIFEROL) 1.25 MG (50000 UNIT) PO CAPS: 50000.0000 [IU] | ORAL_CAPSULE | ORAL | 0 refills | Status: DC

## 2017-05-08 MED ORDER — METHOTREXATE 2.5 MG PO TABS: 20.0000 mg | ORAL_TABLET | ORAL | 0 refills | Status: DC

## 2017-05-08 NOTE — Telephone Encounter (Signed)
Patient would like to know with the decrease in MTX, are you okay if she uses tumeric. Please advise.

## 2017-05-08 NOTE — Telephone Encounter (Signed)
Ok to use Turmeric.

## 2017-05-08 NOTE — Progress Notes (Signed)
Elevated LFTs. She should reduce methotrexate to 8 tablets by mouth every week.Vitamin D is still low. She should take vitamin D 50,000 units twice a week for 3 months. Recheck vitamin D, anti-tTG, antigliadin in 3 months.

## 2017-05-08 NOTE — Telephone Encounter (Signed)
-----   Message from Bo Merino, MD sent at 05/08/2017 12:06 PM EDT ----- Elevated LFTs. She should reduce methotrexate to 8 tablets by mouth every week.Vitamin D is still low. She should take vitamin D 50,000 units twice a week for 3 months. Recheck vitamin D, anti-tTG, antigliadin in 3 months.

## 2017-05-09 NOTE — Telephone Encounter (Signed)
Left message to advise patient okay to continue to use Turmeric

## 2017-05-19 NOTE — Progress Notes (Signed)
Subjective:    Patient ID: Jill Harper, female    DOB: [birth year], 51 y.o.   MRN: [id number]  HPI The patient is here for follow up.  MVP:  She was diagnosed with MVP years ago.  She would take 1/2 atenolol as needed for palpitations.  She has not needed atenolol in a while.   One week ago she woke up with right sided chest pain.  She did have a cough and thought she stained herself.  She did have increased pain with movement.  It was tender to touch. She took aleve and iced the area.  It got better.  The net morning she had the palpitations from MVP.  It lasted a few hours.  She typically gets some fatigue with it and that has not resolved after a copuple of days.  She did have some SOB with it.   She does not drink any caffeine.  She denies other things that may have caused the palpitations.  Sometimes weather changes at this time of year can cause it.    She is exercising some, but not as much as she should be doing.   Medications and allergies reviewed with patient and updated if appropriate.  Patient Active Problem List   Diagnosis Date Noted  . Wheezing 04/05/2017  . Jo-1 antibody positive 01/29/2017  . Primary osteoarthritis of both feet 01/29/2017  . Primary osteoarthritis of both hands 01/29/2017  . Other fatigue 01/29/2017  . Trapezoid ligament sprain 01/17/2017  . Left shoulder tendonitis 01/17/2017  . Eustachian tube dysfunction 09/18/2016  . Influenza-like illness 08/31/2016  . Vitamin D deficiency 04/26/2016  . High risk medication use 03/26/2016  . Frozen shoulder, right 03/26/2016  . Morbid obesity ([place]) 03/26/2016  . Cough 07/18/2015  . Neck mass 08/27/2013  . MVP (mitral valve prolapse)   . Erythema nodosum   . Throat clearing 05/07/2012  . INCONTINENCE, URGE 05/26/2010  . Migraine headache 02/15/2010  . Fibromyalgia 06/20/2009  . Polymyositis ([place]) 02/11/2008  . BRONCHIECTASIS 02/10/2008    Current Outpatient Prescriptions on File Prior to Visit    Medication Sig Dispense Refill  . albuterol (PROVENTIL HFA) 108 (90 Base) MCG/ACT inhaler Inhale 2 puffs into the lungs 4 (four) times daily as needed. Reported on 09/03/2015 1 Inhaler 4  . atenolol (TENORMIN) 25 MG tablet Take 12.5 mg by mouth daily as needed (for symptoms of mitral prolapse).    . cyclobenzaprine (FLEXERIL) 10 MG tablet Take 5 mg by mouth at bedtime as needed. Reported on 09/03/2015    . diclofenac sodium (VOLTAREN) 1 % GEL Apply 2 g topically 4 (four) times daily as needed (pain). 300 g 1  . eletriptan (RELPAX) 20 MG tablet One tablet by mouth at onset of headache. May repeat in 2 hours if headache persists or recurs. 10 tablet 0  . fluticasone (FLOVENT HFA) 44 MCG/ACT inhaler Inhale 2 puffs into the lungs 2 (two) times daily. 1 Inhaler 5  . folic acid (FOLVITE) 1 MG tablet Take 2 tablets (2 mg total) by mouth daily. 180 tablet 3  . methotrexate (RHEUMATREX) 2.5 MG tablet Take 8 tablets (20 mg total) by mouth once a week. Caution:Chemotherapy. Protect from light. 96 tablet 0  . Respiratory Therapy Supplies (FLUTTER) DEVI Use as directed 1 each 0  . traMADol (ULTRAM) 50 MG tablet Take 50 mg by mouth every 6 (six) hours as needed for moderate pain. Reported on 09/03/2015    . Vitamin D, Ergocalciferol, (DRISDOL) 50000  units CAPS capsule Take 1 capsule (50,000 Units total) by mouth 2 (two) times a week. 24 capsule 0   No current facility-administered medications on file prior to visit.     Past Medical History:  Diagnosis Date  . Abnormal liver function test   . Bronchiectasis       . Fibromyalgia   . Menorrhagia   . MIGRAINE HEADACHE   . MVP (mitral valve prolapse)   . Polymyositis ([place])    Dr [name]; chronic MTX    Past Surgical History:  Procedure Laterality Date  . CARDIAC CATHETERIZATION  2002   normal  . DILATION AND CURETTAGE OF UTERUS  08-31-08   Dr [name]    Social History   Social History  . Marital status: Married    Spouse name:  N/A  . Number of children: N/A  . Years of education: N/A   Occupational History  . [name] in Sales executive for delivery     Social History Main Topics  . Smoking status: Never Smoker  . Smokeless tobacco: Never Used     Comment: Married, lives with spouse. 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 Alcohol use No  . Drug use: No  . Sexual activity: Yes    Birth control/ protection: Surgical   Other Topics Concern  . None   Social History Narrative   Exercise: trying to walk - limited by fatigue    Family History  Problem Relation Age of Onset  . Diabetes Mother   . Fibromyalgia Mother   . Ulcers Mother   . Multiple myeloma Father   . Lupus Sister   . Other Sister        abdominal adhesions resulting in bowel obstruction  . Heart disease Brother   . Rheum arthritis Sister   . Hypertension Sister   . Diabetes Sister   . Hypertension Sister   . Rheum arthritis Sister   . Diabetes Sister     Review of Systems  Constitutional: Negative for chills, fatigue (no recent change) and fever.  Respiratory: Positive for cough (mild, dry intermittent) and shortness of breath (with palpitations, has not resolved completely). Negative for wheezing.   Cardiovascular: Positive for chest pain (right sided - muscular) and palpitations. Negative for leg swelling.  Gastrointestinal: Negative for abdominal pain and nausea.  Neurological: Positive for light-headedness. Negative for headaches.       Objective:   Vitals:   05/22/17 0850  BP: 120/82  Pulse: 87  Resp: 16  Temp: 98.9 F (37.2 C)  SpO2: 98%   Wt Readings from Last 3 Encounters:  05/22/17 260 lb (117.9 kg)  04/05/17 262 lb (118.8 kg)  03/05/17 257 lb (116.6 kg)   Body mass index is 39.53 kg/m.   Physical Exam    Constitutional: Appears well-developed and well-nourished. No distress.  HENT:  Head: Normocephalic and atraumatic.  Neck: Neck supple. No  tracheal deviation present. No thyromegaly present.  No cervical lymphadenopathy Cardiovascular: Normal rate, regular rhythm and normal heart sounds.   2/6 systolic murmur heard. No carotid bruit .  No edema Pulmonary/Chest: Effort normal and breath sounds normal. No respiratory distress. No has no wheezes. No rales.  Skin: Skin is warm and dry. Not diaphoretic.  Psychiatric: Normal mood and affect. Behavior is normal.      Assessment & Plan:    See Problem List for Assessment and Plan of chronic medical problems.

## 2017-05-22 ENCOUNTER — Ambulatory Visit (INDEPENDENT_AMBULATORY_CARE_PROVIDER_SITE_OTHER): Payer: Federal, State, Local not specified - PPO | Admitting: Internal Medicine

## 2017-05-22 ENCOUNTER — Encounter: Payer: Self-pay | Admitting: Internal Medicine

## 2017-05-22 DIAGNOSIS — I340 Nonrheumatic mitral (valve) insufficiency: Secondary | ICD-10-CM | POA: Diagnosis not present

## 2017-05-22 DIAGNOSIS — I341 Nonrheumatic mitral (valve) prolapse: Secondary | ICD-10-CM | POA: Diagnosis not present

## 2017-05-22 MED ORDER — ATENOLOL 25 MG PO TABS: 12.5000 mg | ORAL_TABLET | Freq: Every day | ORAL | 0 refills | Status: DC | PRN

## 2017-05-22 NOTE — Assessment & Plan Note (Addendum)
Rare palpitations Has not had any for years - had a recent episode Restart atenolol 12. 5 mg prn  Will recheck echo bloodwork over last few months reviewed - tsh normal, no other concerns

## 2017-05-22 NOTE — Assessment & Plan Note (Signed)
Last Echo 2013 - she did have some MR Will recheck Echo now to make sure MR has not worsened

## 2017-05-22 NOTE — Patient Instructions (Signed)
  An Echocardiogram was ordered - someone will call you to set this up.    Medications reviewed and updated.  Changes include starting atenolol as needed.   Your prescription(s) have been submitted to your pharmacy. Please take as directed and contact our office if you believe you are having problem(s) with the medication(s).

## 2017-05-29 ENCOUNTER — Ambulatory Visit (HOSPITAL_COMMUNITY): Payer: Federal, State, Local not specified - PPO | Attending: Cardiovascular Disease

## 2017-05-29 ENCOUNTER — Other Ambulatory Visit: Payer: Self-pay

## 2017-05-29 DIAGNOSIS — Z8249 Family history of ischemic heart disease and other diseases of the circulatory system: Secondary | ICD-10-CM | POA: Diagnosis not present

## 2017-05-29 DIAGNOSIS — I341 Nonrheumatic mitral (valve) prolapse: Secondary | ICD-10-CM | POA: Diagnosis not present

## 2017-05-29 DIAGNOSIS — R079 Chest pain, unspecified: Secondary | ICD-10-CM | POA: Insufficient documentation

## 2017-05-29 DIAGNOSIS — I340 Nonrheumatic mitral (valve) insufficiency: Secondary | ICD-10-CM

## 2017-05-29 DIAGNOSIS — I081 Rheumatic disorders of both mitral and tricuspid valves: Secondary | ICD-10-CM | POA: Diagnosis not present

## 2017-05-29 DIAGNOSIS — E669 Obesity, unspecified: Secondary | ICD-10-CM | POA: Diagnosis not present

## 2017-05-29 DIAGNOSIS — I517 Cardiomegaly: Secondary | ICD-10-CM

## 2017-06-02 ENCOUNTER — Encounter: Payer: Self-pay | Admitting: Internal Medicine

## 2017-06-02 DIAGNOSIS — I517 Cardiomegaly: Secondary | ICD-10-CM | POA: Insufficient documentation

## 2017-07-22 ENCOUNTER — Other Ambulatory Visit: Payer: Self-pay | Admitting: Internal Medicine

## 2017-07-30 ENCOUNTER — Other Ambulatory Visit: Payer: Self-pay | Admitting: Rheumatology

## 2017-07-31 NOTE — Telephone Encounter (Signed)
Patient advised will recheck Vitamin D level before sending in prescription.

## 2017-08-04 NOTE — Progress Notes (Signed)
Office Visit Note  Patient: Jill Harper             Date of Birth: 1965/08/25           MRN: [id number]             PCP: Binnie Rail, MD Referring: Binnie Rail, MD Visit Date: 08/15/2017 Occupation: '@GUAROCC'$ @    Subjective:  Medication management   History of Present Illness: Jill Harper is a 51 y.o. female with history of sero positive rheumatoid arthritis and polymyositis. She states she's not having any increased joint pain or joint swelling. She states she's been feeling very tired as she had to extra chores recently. There is no increased muscle weakness. She's been coughing more than usual recently. She's been using her inhaler and has an appointment with [name] next week. Her most recent labs showed persistent vitamin D deficiency she was given a prescription for vitamin D she's been taking only once a week.  Activities of Daily Living:  Patient reports morning stiffness for 5 minutes.   Patient Denies nocturnal pain.  Difficulty dressing/grooming: Denies Difficulty climbing stairs: Denies Difficulty getting out of chair: Denies Difficulty using hands for taps, buttons, cutlery, and/or writing: Denies   Review of Systems  Constitutional: Positive for fatigue and weight gain. Negative for night sweats, weight loss and weakness.  HENT: Negative for mouth sores, trouble swallowing, trouble swallowing, mouth dryness and nose dryness.   Eyes: Negative for pain, redness, visual disturbance and dryness.  Respiratory: Positive for cough. Negative for shortness of breath and difficulty breathing.   Cardiovascular: Negative for chest pain, palpitations, hypertension, irregular heartbeat and swelling in legs/feet.  Gastrointestinal: Negative for blood in stool, constipation and diarrhea.  Endocrine: Negative for increased urination.  Genitourinary: Negative for vaginal dryness.  Musculoskeletal: Positive for myalgias, morning stiffness and myalgias. Negative for joint  swelling, muscle weakness and muscle tenderness.  Skin: Negative for color change, rash, hair loss, skin tightness, ulcers and sensitivity to sunlight.  Allergic/Immunologic: Negative for susceptible to infections.  Neurological: Negative for dizziness, memory loss and night sweats.  Hematological: Negative for swollen glands.  Psychiatric/Behavioral: Negative for depressed mood and sleep disturbance. The patient is not nervous/anxious.     PMFS History:  Patient Active Problem List   Diagnosis Date Noted  . LVH (left ventricular hypertrophy) 06/02/2017  . Mitral regurgitation 05/22/2017  . Wheezing 04/05/2017  . Jo-1 antibody positive 01/29/2017  . Primary osteoarthritis of both feet 01/29/2017  . Primary osteoarthritis of both hands 01/29/2017  . Other fatigue 01/29/2017  . Trapezoid ligament sprain 01/17/2017  . Left shoulder tendonitis 01/17/2017  . Eustachian tube dysfunction 09/18/2016  . Influenza-like illness 08/31/2016  . Vitamin D deficiency 04/26/2016  . High risk medication use 03/26/2016  . Frozen shoulder, right 03/26/2016  . Morbid obesity ([place]) 03/26/2016  . Cough 07/18/2015  . Neck mass 08/27/2013  . MVP (mitral valve prolapse)   . Erythema nodosum   . Throat clearing 05/07/2012  . INCONTINENCE, URGE 05/26/2010  . Migraine headache 02/15/2010  . Fibromyalgia 06/20/2009  . Polymyositis ([place]) 02/11/2008  . BRONCHIECTASIS 02/10/2008    Past Medical History:  Diagnosis Date  . Abnormal liver function test   . Bronchiectasis       . Fibromyalgia   . Menorrhagia   . MIGRAINE HEADACHE   . MVP (mitral valve prolapse)   . Polymyositis ([place])    Dr [name]; chronic MTX    Family History  Problem Relation Age of Onset  . Diabetes Mother   . Fibromyalgia Mother   . Ulcers Mother   . Multiple myeloma Father   . Lupus Sister   . Other Sister        abdominal adhesions resulting in bowel obstruction  . Heart disease Brother   . Rheum arthritis  Sister   . Hypertension Sister   . Heart attack Sister   . Diabetes Sister   . Hypertension Sister   . Rheum arthritis Sister   . Diabetes Sister   . Diabetes Brother    Past Surgical History:  Procedure Laterality Date  . CARDIAC CATHETERIZATION  2002   normal  . DILATION AND CURETTAGE OF UTERUS  08-31-08   Dr [name]   Social History   Social History Narrative   Exercise: trying to walk - limited by fatigue     Objective: Vital Signs: BP 120/74 (BP Location: Left Arm, Patient Position: Sitting, Cuff Size: Normal)   Pulse 81   Resp 17   Ht _0  (1.727 m)   Wt 276 lb (125.2 kg)   BMI 41.97 kg/m    Physical Exam  Constitutional: She is oriented to person, place, and time. She appears well-developed and well-nourished.  HENT:  Head: Normocephalic and atraumatic.  Eyes: Conjunctivae and EOM are normal.  Neck: Normal range of motion.  Cardiovascular: Normal rate, regular rhythm and intact distal pulses.  Murmur heard. Pulmonary/Chest: Effort normal and breath sounds normal.  Abdominal: Soft. Bowel sounds are normal.  Lymphadenopathy:    She has no cervical adenopathy.  Neurological: She is alert and oriented to person, place, and time.  Skin: Skin is warm and dry. Capillary refill takes less than 2 seconds.  Psychiatric: She has a normal mood and affect. Her behavior is normal.  Nursing note and vitals reviewed.    Musculoskeletal Exam: C-spine and thoracic lumbar spine good range of motion. Shoulder joints elbow joints wrist joint MCPs PIPs DIPs with good range of motion. Hip joints knee joints ankles MTPs PIPs DIPs are good range of motion. No muscular weakness or tenderness was noted.  CDAI Exam: CDAI Homunculus Exam:   Joint Counts:  CDAI Tender Joint count: 0 CDAI Swollen Joint count: 0  Global Assessments:  Patient Global Assessment: 3 Provider Global Assessment: 3  CDAI Calculated Score: 6    Investigation: No additional findings. CBC  Latest Ref Rng & Units 05/07/2017 01/31/2017 04/24/2016  WBC 3.8 - 10.8 Thousand/uL 4.0 7.4 5.6  Hemoglobin 11.7 - 15.5 g/dL 13.7 14.1 13.4  Hematocrit 35.0 - 45.0 % 40.7 42.8 39.5  Platelets 140 - 400 Thousand/uL 229 234 206.0   CMP Latest Ref Rng & Units 05/07/2017 01/31/2017 04/24/2016  Glucose 65 - 99 mg/dL 102(H) 86 85  BUN 7 - 25 mg/dL _1 Creatinine 0.50 - 1.05 mg/dL 0.76 0.71 0.67  Sodium 135 - 146 mmol/L 140 139 141  Potassium 3.5 - 5.3 mmol/L 4.7 4.6 3.9  Chloride 98 - 110 mmol/L 108 107 109  CO2 20 - 32 mmol/L _2 Calcium 8.6 - 10.4 mg/dL 9.4 9.0 8.6  Total Protein 6.1 - 8.1 g/dL 7.0 7.1 6.9  Total Bilirubin 0.2 - 1.2 mg/dL 0.7 0.5 0.6  Alkaline Phos 33 - 130 U/L - 141(H) 100  AST 10 - 35 U/L 56(H) 21 23  ALT 6 - 29 U/L 53(H) 17 18    Imaging: No results found.  Speciality Comments: No specialty comments  available.    Procedures:  No procedures performed Allergies: Sulfonamide derivatives   Assessment / Plan:     Visit Diagnoses: Rheumatoid arthritis involving multiple sites with positive rheumatoid factor (HCC) - Positive RF, positive anti-CCP. Patient has no synovitis on examination she's been tolerating methotrexate well. We reduced her methotrexate to 8 tablets by mouth every week from 10 tablets due to elevation in her LFTs.  Polymyositis ([place]) - Diagnosed by Dr. Charlestine Night 2007 CK 801, Jo 1 positive, CK normal. -Her CK has been normal recently. Plan: CK  High risk medication use - MTX 8 tablets by mouth every week, folic acid 2 mg by mouth daily - Plan: CBC with Differential/Platelet, COMPLETE METABOLIC PANEL WITH GFR WE will check labs today and then every 3 months to monitor for drug toxicity.  Other fatigue: She does have chronic fatigue which is stable.  Primary osteoarthritis of both hands: She has some stiffness and pain in her hands.  Primary osteoarthritis of both feet: Proper fitting shoes were discussed.   Fibromyalgia: She continues to have  some chronic muscle pain.  Bronchiectasis without complication (HCC) - Chronic bronchiectasis treated by [name]. She has appointment coming up next week.  Erythema nodosum -no recent episodes.   History of mitral valve prolapse: She had recent cardiology workup.  History of vitamin D deficiency - Plan: VITAMIN D 25 Hydroxy (Vit-D Deficiency, Fractures). She's been taking vitamin D instead of twice a week only once a week. We will check her labs today. As she did not have a good response to vitamin D during the last course I would also check anti-tTG and antigliadin antibodies.  History of migraine  Pain in joint, multiple sites - Plan: Gliadin antibodies, serum, Tissue transglutaminase, IgA    Orders: Orders Placed This Encounter  Procedures  . CBC with Differential/Platelet  . COMPLETE METABOLIC PANEL WITH GFR  . VITAMIN D 25 Hydroxy (Vit-D Deficiency, Fractures)  . CK   No orders of the defined types were placed in this encounter.   Face-to-face time spent with patient was 30 minutes. Greater than 50% of time was spent in counseling and coordination of care.  Follow-Up Instructions: Return in about 5 months (around 01/13/2018) for Rheumatoid arthritis, PM, OA , FMS.   Bo Merino, MD  Note - This record has been created using Editor, commissioning.  Chart creation errors have been sought, but may not always  have been located. Such creation errors do not reflect on  the standard of medical care.

## 2017-08-15 ENCOUNTER — Ambulatory Visit: Payer: Federal, State, Local not specified - PPO | Admitting: Rheumatology

## 2017-08-15 ENCOUNTER — Encounter: Payer: Self-pay | Admitting: Rheumatology

## 2017-08-15 VITALS — BP 120/74 | HR 81 | Resp 17 | Ht 68.0 in | Wt 276.0 lb

## 2017-08-15 DIAGNOSIS — M2550 Pain in unspecified joint: Secondary | ICD-10-CM

## 2017-08-15 DIAGNOSIS — M19041 Primary osteoarthritis, right hand: Secondary | ICD-10-CM

## 2017-08-15 DIAGNOSIS — J479 Bronchiectasis, uncomplicated: Secondary | ICD-10-CM | POA: Diagnosis not present

## 2017-08-15 DIAGNOSIS — L52 Erythema nodosum: Secondary | ICD-10-CM

## 2017-08-15 DIAGNOSIS — M3320 Polymyositis, organ involvement unspecified: Secondary | ICD-10-CM | POA: Diagnosis not present

## 2017-08-15 DIAGNOSIS — Z8639 Personal history of other endocrine, nutritional and metabolic disease: Secondary | ICD-10-CM

## 2017-08-15 DIAGNOSIS — Z79899 Other long term (current) drug therapy: Secondary | ICD-10-CM | POA: Diagnosis not present

## 2017-08-15 DIAGNOSIS — M19071 Primary osteoarthritis, right ankle and foot: Secondary | ICD-10-CM | POA: Diagnosis not present

## 2017-08-15 DIAGNOSIS — M797 Fibromyalgia: Secondary | ICD-10-CM

## 2017-08-15 DIAGNOSIS — Z8669 Personal history of other diseases of the nervous system and sense organs: Secondary | ICD-10-CM | POA: Diagnosis not present

## 2017-08-15 DIAGNOSIS — M19072 Primary osteoarthritis, left ankle and foot: Secondary | ICD-10-CM

## 2017-08-15 DIAGNOSIS — Z8679 Personal history of other diseases of the circulatory system: Secondary | ICD-10-CM | POA: Diagnosis not present

## 2017-08-15 DIAGNOSIS — R5383 Other fatigue: Secondary | ICD-10-CM | POA: Diagnosis not present

## 2017-08-15 DIAGNOSIS — M19042 Primary osteoarthritis, left hand: Secondary | ICD-10-CM

## 2017-08-15 DIAGNOSIS — M0579 Rheumatoid arthritis with rheumatoid factor of multiple sites without organ or systems involvement: Secondary | ICD-10-CM | POA: Diagnosis not present

## 2017-08-15 NOTE — Patient Instructions (Signed)
Standing Labs We placed an order today for your standing lab work.    Please come back and get your standing labs in April and every 3 months  We have open lab Monday through Friday from 8:30-11:30 AM and 1:30-4 PM at the office of Dr. Shivaan Tierno.   The office is located at [address], [address], [place], [place] [address] No appointment is necessary.   Labs are drawn by [facility].  You may receive a bill from [facility] for your lab work. If you have any questions regarding directions or hours of operation,  please call [phone].    

## 2017-08-16 NOTE — Progress Notes (Signed)
Vitamin D 50,000 units twice a week for 3 months. Recheck levels in 3 months. She will need maintenance dose after 3 months.

## 2017-08-19 ENCOUNTER — Telehealth: Payer: Self-pay | Admitting: *Deleted

## 2017-08-19 DIAGNOSIS — E559 Vitamin D deficiency, unspecified: Secondary | ICD-10-CM

## 2017-08-19 LAB — COMPLETE METABOLIC PANEL WITH GFR
AG Ratio: 1.1 (calc) (ref 1.0–2.5)
ALKALINE PHOSPHATASE (APISO): 118 U/L (ref 33–130)
ALT: 12 U/L (ref 6–29)
AST: 18 U/L (ref 10–35)
Albumin: 3.9 g/dL (ref 3.6–5.1)
BUN: 8 mg/dL (ref 7–25)
CO2: 27 mmol/L (ref 20–32)
CREATININE: 0.67 mg/dL (ref 0.50–1.05)
Calcium: 9.3 mg/dL (ref 8.6–10.4)
Chloride: 107 mmol/L (ref 98–110)
GFR, Est African American: 118 mL/min/{1.73_m2} (ref >=60)
GFR, Est Non African American: 102 mL/min/{1.73_m2} (ref >=60)
Globulin: 3.7 g/dL (calc) (ref 1.9–3.7)
Glucose, Bld: 75 mg/dL (ref 65–99)
Potassium: 4.4 mmol/L (ref 3.5–5.3)
Sodium: 140 mmol/L (ref 135–146)
Total Bilirubin: 0.4 mg/dL (ref 0.2–1.2)
Total Protein: 7.6 g/dL (ref 6.1–8.1)

## 2017-08-19 LAB — CBC WITH DIFFERENTIAL/PLATELET
BASOS ABS: 53 {cells}/uL (ref 0–200)
Basophils Relative: 0.7 %
EOS PCT: 2 %
Eosinophils Absolute: 150 cells/uL (ref 15–500)
HCT: 41.7 % (ref 35.0–45.0)
HEMOGLOBIN: 13.7 g/dL (ref 11.7–15.5)
Lymphs Abs: 1695 cells/uL (ref 850–3900)
MCH: 28 pg (ref 27.0–33.0)
MCHC: 32.9 g/dL (ref 32.0–36.0)
MCV: 85.3 fL (ref 80.0–100.0)
MPV: 10.7 fL (ref 7.5–12.5)
Monocytes Relative: 10.5 %
NEUTROS ABS: 4815 {cells}/uL (ref 1500–7800)
NEUTROS PCT: 64.2 %
Platelets: 270 10*3/uL (ref 140–400)
RBC: 4.89 10*6/uL (ref 3.80–5.10)
RDW: 13 % (ref 11.0–15.0)
Total Lymphocyte: 22.6 %
WBC mixed population: 788 cells/uL (ref 200–950)
WBC: 7.5 10*3/uL (ref 3.8–10.8)

## 2017-08-19 LAB — GLIADIN ANTIBODIES, SERUM
Gliadin IgA: 6 Units
Gliadin IgG: 4 Units

## 2017-08-19 LAB — CK: CK TOTAL: 51 U/L (ref 29–143)

## 2017-08-19 LAB — VITAMIN D 25 HYDROXY (VIT D DEFICIENCY, FRACTURES): VIT D 25 HYDROXY: 14 ng/mL — AB (ref 30–100)

## 2017-08-19 LAB — TISSUE TRANSGLUTAMINASE, IGA: (TTG) AB, IGA: 1 U/mL

## 2017-08-19 MED ORDER — VITAMIN D (ERGOCALCIFEROL) 1.25 MG (50000 UNIT) PO CAPS: 50000.0000 [IU] | ORAL_CAPSULE | ORAL | 0 refills | Status: DC

## 2017-08-19 MED ORDER — METHOTREXATE 2.5 MG PO TABS: 20.0000 mg | ORAL_TABLET | ORAL | 0 refills | Status: DC

## 2017-08-19 NOTE — Telephone Encounter (Signed)
Last Visit: 08/15/17 Next Visit: 02/18/18 Labs: 08/15/17 cbc/cmp wnl  Okay to refill mtx per Dr. Estanislado Pandy

## 2017-08-19 NOTE — Telephone Encounter (Signed)
-----   Message from Bo Merino, MD sent at 08/16/2017  1:47 PM EST ----- Vitamin D 50,000 units twice a week for 3 months. Recheck levels in 3 months. She will need maintenance dose after 3 months.

## 2017-08-21 ENCOUNTER — Ambulatory Visit: Payer: Federal, State, Local not specified - PPO | Admitting: Pulmonary Disease

## 2017-08-21 ENCOUNTER — Encounter: Payer: Self-pay | Admitting: Pulmonary Disease

## 2017-08-21 VITALS — BP 120/64 | HR 91 | Ht 68.0 in | Wt 270.0 lb

## 2017-08-21 DIAGNOSIS — J479 Bronchiectasis, uncomplicated: Secondary | ICD-10-CM

## 2017-08-21 MED ORDER — FLUTICASONE FUROATE-VILANTEROL 100-25 MCG/INH IN AEPB: 1.0000 | INHALATION_SPRAY | Freq: Every day | RESPIRATORY_TRACT | 5 refills | Status: DC

## 2017-08-21 MED ORDER — FLUTICASONE FUROATE-VILANTEROL 100-25 MCG/INH IN AEPB: 1.0000 | INHALATION_SPRAY | Freq: Every day | RESPIRATORY_TRACT | 0 refills | Status: DC

## 2017-08-21 NOTE — Progress Notes (Signed)
[facility]  Chief Complaint  Patient presents with  . Follow-up    Pt uses rescue inhaler once daily in last 4 weeks due to cough and some wheezing. Pt has productive cough-Mouser in last 3 weeks it has increased, and some dark blood no bright red blood. Pt has chest tightness and some upper back pain.     Vital signs: BP 120/64 (BP Location: Left Arm, Cuff Size: Normal)   Pulse 91   Ht 5' 8" (1.727 m)   Wt 270 lb (122.5 kg)   SpO2 97%   BMI 41.05 kg/m   History of Present Illness: Jill Harper is a 52 y.o. female with bronchiectasis in setting of RA, and polymyositis.  She has noticed more cough over the past few weeks.  This seems to happen more when she goes from one temperature environment to another.  She will get into prolonged coughing spells and then vomit.  She uses her albuterol and this helps.  She has been using flovent.  She brings up [place] sputum sometimes.  She is not having fever, sinus congestion, sore throat, or skin rash.  Her chest gets sore after coughing.  Physical Exam:  General - pleasant Eyes - pupils reactive ENT - no sinus tenderness, no oral exudate, no LAN Cardiac - regular, no murmur Chest - no wheeze, rales Abd - soft, non tender Ext - no edema Skin - no rashes Neuro - normal strength Psych - normal mood  Assessment/Plan:  Bronchiectasis in setting of rheumatoid arthritis and polymyositis. - she has increased symptoms of cough - will switch her to breo and continue prn albuterol - if symptoms persist, then will need repeat chest imaging and sputum culture  Rheumatoid arthritis and olymyositis. - on MTX per Dr. Estanislado Pandy   Patient Instructions  Breo one puff daily, and rinse mouth after each use Stop using flovent while using breo Albuterol two puffs every 4 to 6 hours as needed for cough, wheeze, or chest congestion  Follow up in 6 months    Chesley Mires, MD [place] 08/21/2017, 11:45 AM Pager:  [phone]  Flow Sheet  Chest imaging: CT chest 03/23/05 >> patchy b/l lower lung ASD with cylindrical BTX CT chest 02/17/08 >> peripheral and basilar predominant subpleural GGO CT chest 12/01/08 >> no change HRCT chest 09/07/15 >> scattered GGO  Pulmonary tests: PFT 10/30/11 >> FEV1 3.31 (114%), FEV1% 85, TLC 6.06 (108%), DLCO 77%, no BD PFT 11/18/12 >> FEV1 3.18 (111%), FEV1% 84, TLC 5.34 (95%), DLCO 67%, no BD PFT 12/02/15 >> FEV1 2.96 (108%), FEV1% 92, TLC 4.95 (86%), DLCO 65%, no BD Serology 01/31/17 >> ANA negative, RF 29, anti CCP 213, SCL 70 negative, SSA/SSB negative, ENA SM negative, Jo-1 positive Quantiferon gold 01/31/17 >> negative  Cardiac tests: Echo 05/29/17 >> EF 60 to 65%, mod LVH, grade 1 DD  Past Medical History: She  has a past medical history of Abnormal liver function test, Bronchiectasis, Fibromyalgia, Menorrhagia, MIGRAINE HEADACHE, MVP (mitral valve prolapse), and Polymyositis ([place]).  Past Surgical History: She  has a past surgical history that includes Cardiac catheterization (2002) and Dilation and curettage of uterus (08-31-08).  Family History: Her family history includes Diabetes in her brother, mother, sister, and sister; Fibromyalgia in her mother; Heart attack in her sister; Heart disease in her brother; Hypertension in her sister and sister; Lupus in her sister; Multiple myeloma in her father; Other in her sister; Rheum arthritis in her  sister and sister; Ulcers in her mother.  Social History: She  reports that  has never smoked. she has never used smokeless tobacco. She reports that she does not drink alcohol or use drugs.  Medications: Allergies as of 08/21/2017      Reactions   Sulfonamide Derivatives    REACTION: hives      Medication List        Accurate as of 08/21/17 11:45 AM. Always use your most recent med list.          albuterol 108 (90 Base) MCG/ACT inhaler Commonly known as:  PROVENTIL HFA Inhale  2 puffs into the lungs 4 (four) times daily as needed. Reported on 09/03/2015   atenolol 25 MG tablet Commonly known as:  TENORMIN TAKE 1/2 TABLET BY MOUTH DAILY AS NEEDED FOR SYMPTOMS OF MITRAL PROLAPSE   cyclobenzaprine 10 MG tablet Commonly known as:  FLEXERIL Take 5 mg by mouth at bedtime as needed. Reported on 09/03/2015   diclofenac sodium 1 % Gel Commonly known as:  VOLTAREN Apply 2 g topically 4 (four) times daily as needed (pain).   eletriptan 20 MG tablet Commonly known as:  RELPAX One tablet by mouth at onset of headache. May repeat in 2 hours if headache persists or recurs.   fluticasone furoate-vilanterol 100-25 MCG/INH Aepb Commonly known as:  BREO ELLIPTA Inhale 1 puff into the lungs daily.   FLUTTER Devi Use as directed   folic acid 1 MG tablet Commonly known as:  FOLVITE Take 2 tablets (2 mg total) by mouth daily.   methotrexate 2.5 MG tablet Commonly known as:  RHEUMATREX Take 8 tablets (20 mg total) by mouth once a week. Caution:Chemotherapy. Protect from light.   traMADol 50 MG tablet Commonly known as:  ULTRAM Take 50 mg by mouth every 6 (six) hours as needed for moderate pain. Reported on 09/03/2015   Vitamin D (Ergocalciferol) 50000 units Caps capsule Commonly known as:  DRISDOL Take 1 capsule (50,000 Units total) by mouth 2 (two) times a week.

## 2017-08-21 NOTE — Patient Instructions (Signed)
Breo one puff daily, and rinse mouth after each use Stop using flovent while using breo Albuterol two puffs every 4 to 6 hours as needed for cough, wheeze, or chest congestion  Follow up in 6 months

## 2017-08-21 NOTE — Addendum Note (Signed)
Addended by: Georjean Mode on: 08/21/2017 12:05 PM   Modules accepted: Orders

## 2017-09-01 NOTE — Progress Notes (Signed)
Subjective:    Patient ID: Jill Harper, female    DOB: [birth year], 52 y.o.   MRN: [id number]  HPI She is here for an acute visit for cold symptoms.  Her symptoms started three days ago.   She is experiencing cough more than normal and it changed over the weekend, pain in chest and across her back from cough and heaviness in her chest, fatigue, decreased appetite and eating minimally, body aches, fever 102.3, vomiting after cough - everything other than water, some SOB and wheeze.  She has mild PND and has had headaches.    She has bronchiectasis and was concerned she was having a flare of that, but it felt different.  The heaviness in her chest and the increasing cough has worried her.     She has taken tylenol, inhalers  Medications and allergies reviewed with patient and updated if appropriate.  Patient Active Problem List   Diagnosis Date Noted  . LVH (left ventricular hypertrophy) 06/02/2017  . Mitral regurgitation 05/22/2017  . Wheezing 04/05/2017  . Jo-1 antibody positive 01/29/2017  . Primary osteoarthritis of both feet 01/29/2017  . Primary osteoarthritis of both hands 01/29/2017  . Other fatigue 01/29/2017  . Trapezoid ligament sprain 01/17/2017  . Left shoulder tendonitis 01/17/2017  . Eustachian tube dysfunction 09/18/2016  . Influenza-like illness 08/31/2016  . Vitamin D deficiency 04/26/2016  . High risk medication use 03/26/2016  . Frozen shoulder, right 03/26/2016  . Morbid obesity ([place]) 03/26/2016  . Cough 07/18/2015  . Neck mass 08/27/2013  . MVP (mitral valve prolapse)   . Erythema nodosum   . Throat clearing 05/07/2012  . INCONTINENCE, URGE 05/26/2010  . Migraine headache 02/15/2010  . Fibromyalgia 06/20/2009  . Polymyositis ([place]) 02/11/2008  . BRONCHIECTASIS 02/10/2008    Current Outpatient Medications on File Prior to Visit  Medication Sig Dispense Refill  . albuterol (PROVENTIL HFA) 108 (90 Base) MCG/ACT inhaler Inhale 2 puffs into the lungs 4  (four) times daily as needed. Reported on 09/03/2015 1 Inhaler 4  . atenolol (TENORMIN) 25 MG tablet TAKE 1/2 TABLET BY MOUTH DAILY AS NEEDED FOR SYMPTOMS OF MITRAL PROLAPSE 30 tablet 1  . cyclobenzaprine (FLEXERIL) 10 MG tablet Take 5 mg by mouth at bedtime as needed. Reported on 09/03/2015    . diclofenac sodium (VOLTAREN) 1 % GEL Apply 2 g topically 4 (four) times daily as needed (pain). 300 g 1  . eletriptan (RELPAX) 20 MG tablet One tablet by mouth at onset of headache. May repeat in 2 hours if headache persists or recurs. 10 tablet 0  . fluticasone furoate-vilanterol (BREO ELLIPTA) 100-25 MCG/INH AEPB Inhale 1 puff into the lungs daily. 30 each 5  . fluticasone furoate-vilanterol (BREO ELLIPTA) 100-25 MCG/INH AEPB Inhale 1 puff into the lungs daily. 1 each 0  . folic acid (FOLVITE) 1 MG tablet Take 2 tablets (2 mg total) by mouth daily. 180 tablet 3  . methotrexate (RHEUMATREX) 2.5 MG tablet Take 8 tablets (20 mg total) by mouth once a week. Caution:Chemotherapy. Protect from light. 96 tablet 0  . Respiratory Therapy Supplies (FLUTTER) DEVI Use as directed 1 each 0  . traMADol (ULTRAM) 50 MG tablet Take 50 mg by mouth every 6 (six) hours as needed for moderate pain. Reported on 09/03/2015    . Vitamin D, Ergocalciferol, (DRISDOL) 50000 units CAPS capsule Take 1 capsule (50,000 Units total) by mouth 2 (two) times a week. 24 capsule 0   No current facility-administered medications on file  prior to visit.     Past Medical History:  Diagnosis Date  . Abnormal liver function test   . Bronchiectasis       . Fibromyalgia   . Menorrhagia   . MIGRAINE HEADACHE   . MVP (mitral valve prolapse)   . Polymyositis ([place])    Dr [name]; chronic MTX    Past Surgical History:  Procedure Laterality Date  . CARDIAC CATHETERIZATION  2002   normal  . DILATION AND CURETTAGE OF UTERUS  08-31-08   Dr [name]    Social History   Socioeconomic History  . Marital status: Married     Spouse name: None  . Number of children: None  . Years of education: None  . Highest education level: None  Social Needs  . Financial resource strain: None  . Food insecurity - worry: None  . Food insecurity - inability: None  . Transportation needs - medical: None  . Transportation needs - non-medical: None  Occupational History  . 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 Smoking status: Never Smoker  . Smokeless tobacco: Never Used  . Tobacco comment: Married, lives with spouse. 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 Alcohol use: No  . Drug use: No  . Sexual activity: Yes    Birth control/protection: Surgical  Other Topics Concern  . None  Social History Narrative   Exercise: trying to walk - limited by fatigue    Family History  Problem Relation Age of Onset  . Diabetes Mother   . Fibromyalgia Mother   . Ulcers Mother   . Multiple myeloma Father   . Lupus Sister   . Other Sister        abdominal adhesions resulting in bowel obstruction  . Heart disease Brother   . Rheum arthritis Sister   . Hypertension Sister   . Heart attack Sister   . Diabetes Sister   . Hypertension Sister   . Rheum arthritis Sister   . Diabetes Sister   . Diabetes Brother     Review of Systems  Constitutional: Positive for fatigue and fever.  HENT: Positive for postnasal drip (clear). Negative for congestion, ear pain, sinus pain and sore throat.   Respiratory: Positive for cough, shortness of breath and wheezing.   Cardiovascular: Positive for chest pain.  Gastrointestinal: Positive for vomiting. Negative for diarrhea and nausea.  Musculoskeletal: Positive for myalgias.  Neurological: Positive for headaches.       Objective:   Vitals:   09/02/17 0954  BP: 106/84  Pulse: (!) 109  Resp: 18  Temp: 98.9 F (37.2 C)  SpO2: 97%   Filed Weights   09/02/17 0954    Weight: 267 lb (121.1 kg)   Body mass index is 40.6 kg/m.  Wt Readings from Last 3 Encounters:  09/02/17 267 lb (121.1 kg)  08/21/17 270 lb (122.5 kg)  08/15/17 276 lb (125.2 kg)     Physical Exam GENERAL APPEARANCE: Appears ill, NAD EYES: conjunctiva clear, no icterus HEENT: bilateral tympanic membranes and ear canals normal, oropharynx with mild erythema, no thyromegaly, trachea midline, no cervical or supraclavicular lymphadenopathy LUNGS: Clear to auscultation without wheeze or crackles, unlabored breathing, good air entry bilaterally CARDIOVASCULAR: Normal S1,S2 without murmurs, no edema SKIN: warm, dry        Assessment & Plan:   See Problem List for Assessment and  Plan of chronic medical problems.

## 2017-09-02 ENCOUNTER — Ambulatory Visit: Payer: Federal, State, Local not specified - PPO | Admitting: Internal Medicine

## 2017-09-02 ENCOUNTER — Encounter: Payer: Self-pay | Admitting: Internal Medicine

## 2017-09-02 VITALS — BP 106/84 | HR 109 | Temp 98.9°F | Resp 18 | Wt 267.0 lb

## 2017-09-02 DIAGNOSIS — J101 Influenza due to other identified influenza virus with other respiratory manifestations: Secondary | ICD-10-CM | POA: Insufficient documentation

## 2017-09-02 DIAGNOSIS — R52 Pain, unspecified: Secondary | ICD-10-CM

## 2017-09-02 DIAGNOSIS — J479 Bronchiectasis, uncomplicated: Secondary | ICD-10-CM | POA: Insufficient documentation

## 2017-09-02 DIAGNOSIS — R509 Fever, unspecified: Secondary | ICD-10-CM | POA: Diagnosis not present

## 2017-09-02 DIAGNOSIS — J471 Bronchiectasis with (acute) exacerbation: Secondary | ICD-10-CM | POA: Diagnosis not present

## 2017-09-02 LAB — POCT INFLUENZA A/B
Influenza A, POC: POSITIVE — AB
Influenza B, POC: NEGATIVE

## 2017-09-02 MED ORDER — LEVOFLOXACIN 500 MG PO TABS: 500.0000 mg | ORAL_TABLET | Freq: Every day | ORAL | 0 refills | Status: DC

## 2017-09-02 MED ORDER — OSELTAMIVIR PHOSPHATE 75 MG PO CAPS: 75.0000 mg | ORAL_CAPSULE | Freq: Two times a day (BID) | ORAL | 0 refills | Status: DC

## 2017-09-02 NOTE — Assessment & Plan Note (Signed)
Rapid flu test positive for influenza A tamiflu x 5 days Rest, fluids Tylenol, cough syrup  Call if no improvement

## 2017-09-02 NOTE — Assessment & Plan Note (Signed)
Symptoms c/w bronchiectasis exacerabtion and concern for secondary bacterial infection to flu Start levaquin Continue inhalers Rest, fluids Call if no improvement

## 2017-09-02 NOTE — Patient Instructions (Addendum)
Take the tamiflu and levaquin as prescription.    Your prescription(s) have been submitted to your pharmacy or been printed and provided for you. Please take as directed and contact our office if you believe you are having problem(s) with the medication(s) or have any questions.  If your symptoms worsen or fail to improve, please contact our office for further instruction, or in case of emergency go directly to the emergency room at the closest medical facility.   General Recommendations:    Please drink plenty of fluids.  Get plenty of rest   Sleep in humidified air  Use saline nasal sprays  Netti pot  OTC Medications:  Decongestants - helps relieve congestion   Flonase (generic fluticasone) or Nasacort (generic triamcinolone) - please make sure to use the "cross-over" technique at a 45 degree angle towards the opposite eye as opposed to straight up the nasal passageway.   Sudafed (generic pseudoephedrine - Note this is the one that is available behind the pharmacy counter); Products with phenylephrine (-PE) may also be used but is often not as effective as pseudoephedrine.   If you have HIGH BLOOD PRESSURE - Coricidin HBP; AVOID any product that is -D as this contains pseudoephedrine which may increase your blood pressure.  Afrin (oxymetazoline) every 6-8 hours for up to 3 days.  Allergies - helps relieve runny nose, itchy eyes and sneezing   Claritin (generic loratidine), Allegra (fexofenidine), or Zyrtec (generic cyrterizine) for runny nose. These medications should not cause drowsiness.  Note - Benadryl (generic diphenhydramine) may be used however may cause drowsiness  Cough -   Delsym or Robitussin (generic dextromethorphan)  Expectorants - helps loosen mucus to ease removal   Mucinex (generic guaifenesin) as directed on the package.  Headaches / General Aches   Tylenol (generic acetaminophen) - DO NOT EXCEED 3 grams (3,000 mg) in a 24 hour  time period  Advil/Motrin (generic ibuprofen)  Sore Throat -   Salt water gargle   Chloraseptic (generic benzocaine) spray or lozenges / Sucrets (generic dyclonine)

## 2017-09-10 ENCOUNTER — Encounter: Payer: Self-pay | Admitting: Internal Medicine

## 2017-09-12 NOTE — Progress Notes (Signed)
Subjective:    Patient ID: Jill Harper, female    DOB: 1966/07/28, 52 y.o.   MRN: [id number]  HPI She is here for an acute visit for cold symptoms.  She was here 09/02/17 for the flu.  She took the prescribed medication and her symptoms were getting better, but her symptoms worsened 3 days ago.  She started having a fever over 100.  She took tylenol.  She still had fatigue and the cough from the flu.  The past couple of days her ears are full, she still has a fever up to 100.8.  The cough is not productive and she has pain in her lower lungs after she is done coughing - R > L.   She does have some wheezing that is mild, mild shortness of breath and some chest pain.  She has had some headaches and lightheadedness.  She was not sure what was going on and was concerned about a lung infection.  In the past she has taken a course of Levaquin, but needed another prescription.  She has taken tylenol.   Medications and allergies reviewed with patient and updated if appropriate.  Patient Active Problem List   Diagnosis Date Noted  . Bronchiectasis with acute exacerbation ([place]) 09/02/2017  . Influenza A 09/02/2017  . LVH (left ventricular hypertrophy) 06/02/2017  . Mitral regurgitation 05/22/2017  . Wheezing 04/05/2017  . Jo-1 antibody positive 01/29/2017  . Primary osteoarthritis of both feet 01/29/2017  . Primary osteoarthritis of both hands 01/29/2017  . Other fatigue 01/29/2017  . Trapezoid ligament sprain 01/17/2017  . Left shoulder tendonitis 01/17/2017  . Vitamin D deficiency 04/26/2016  . High risk medication use 03/26/2016  . Frozen shoulder, right 03/26/2016  . Morbid obesity ([place]) 03/26/2016  . Cough 07/18/2015  . Neck mass 08/27/2013  . MVP (mitral valve prolapse)   . Erythema nodosum   . Throat clearing 05/07/2012  . INCONTINENCE, URGE 05/26/2010  . Migraine headache 02/15/2010  . Fibromyalgia 06/20/2009  . Polymyositis ([place]) 02/11/2008  . BRONCHIECTASIS 02/10/2008     Current Outpatient Medications on File Prior to Visit  Medication Sig Dispense Refill  . albuterol (PROVENTIL HFA) 108 (90 Base) MCG/ACT inhaler Inhale 2 puffs into the lungs 4 (four) times daily as needed. Reported on 09/03/2015 1 Inhaler 4  . atenolol (TENORMIN) 25 MG tablet TAKE 1/2 TABLET BY MOUTH DAILY AS NEEDED FOR SYMPTOMS OF MITRAL PROLAPSE 30 tablet 1  . cyclobenzaprine (FLEXERIL) 10 MG tablet Take 5 mg by mouth at bedtime as needed. Reported on 09/03/2015    . diclofenac sodium (VOLTAREN) 1 % GEL Apply 2 g topically 4 (four) times daily as needed (pain). 300 g 1  . eletriptan (RELPAX) 20 MG tablet One tablet by mouth at onset of headache. May repeat in 2 hours if headache persists or recurs. 10 tablet 0  . fluticasone furoate-vilanterol (BREO ELLIPTA) 100-25 MCG/INH AEPB Inhale 1 puff into the lungs daily. 30 each 5  . fluticasone furoate-vilanterol (BREO ELLIPTA) 100-25 MCG/INH AEPB Inhale 1 puff into the lungs daily. 1 each 0  . folic acid (FOLVITE) 1 MG tablet Take 2 tablets (2 mg total) by mouth daily. 180 tablet 3  . methotrexate (RHEUMATREX) 2.5 MG tablet Take 8 tablets (20 mg total) by mouth once a week. Caution:Chemotherapy. Protect from light. 96 tablet 0  . Respiratory Therapy Supplies (FLUTTER) DEVI Use as directed 1 each 0  . traMADol (ULTRAM) 50 MG tablet Take 50 mg by mouth every  6 (six) hours as needed for moderate pain. Reported on 09/03/2015    . Vitamin D, Ergocalciferol, (DRISDOL) 50000 units CAPS capsule Take 1 capsule (50,000 Units total) by mouth 2 (two) times a week. 24 capsule 0   No current facility-administered medications on file prior to visit.     Past Medical History:  Diagnosis Date  . Abnormal liver function test   . Bronchiectasis       . Fibromyalgia   . Menorrhagia   . MIGRAINE HEADACHE   . MVP (mitral valve prolapse)   . Polymyositis ([employer])    Dr [name]; chronic MTX    Past Surgical History:  Procedure Laterality Date  .  CARDIAC CATHETERIZATION  2002   normal  . DILATION AND CURETTAGE OF UTERUS  08-31-08   Dr [name]    Social History   Socioeconomic History  . Marital status: Married    Spouse name: None  . Number of children: None  . Years of education: None  . Highest education level: None  Social Needs  . Financial resource strain: None  . Food insecurity - worry: None  . Food insecurity - inability: None  . Transportation needs - medical: None  . Transportation needs - non-medical: None  Occupational History  . 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 Smoking status: Never Smoker  . Smokeless tobacco: Never Used  . Tobacco comment: Married, lives with spouse. 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 Alcohol use: No  . Drug use: No  . Sexual activity: Yes    Birth control/protection: Surgical  Other Topics Concern  . None  Social History Narrative   Exercise: trying to walk - limited by fatigue    Family History  Problem Relation Age of Onset  . Diabetes Mother   . Fibromyalgia Mother   . Ulcers Mother   . Multiple myeloma Father   . Lupus Sister   . Other Sister        abdominal adhesions resulting in bowel obstruction  . Heart disease Brother   . Rheum arthritis Sister   . Hypertension Sister   . Heart attack Sister   . Diabetes Sister   . Hypertension Sister   . Rheum arthritis Sister   . Diabetes Sister   . Diabetes Brother     Review of Systems  Constitutional: Positive for fatigue and fever.  HENT: Positive for ear pain (fullness). Negative for congestion, sinus pressure and sore throat.   Respiratory: Positive for cough, shortness of breath and wheezing (mild when laying down, sometimes).   Cardiovascular: Positive for chest pain (mild yesterday on right side).  Gastrointestinal: Positive for diarrhea (a few days ago). Negative for nausea.   Neurological: Positive for light-headedness and headaches.       Objective:   Vitals:   09/13/17 1526  BP: 134/80  Pulse: 89  Resp: 16  Temp: 98.6 F (37 C)  SpO2: 96%   Filed Weights   09/13/17 1526  Weight: 269 lb (122 kg)   Body mass index is 40.9 kg/m.  Wt Readings from Last 3 Encounters:  09/13/17 269 lb (122 kg)  09/02/17 267 lb (121.1 kg)  08/21/17 270 lb (122.5 kg)     Physical Exam GENERAL APPEARANCE: Appears stated age, well appearing, NAD EYES: conjunctiva clear, no icterus HEENT: bilateral tympanic membranes and ear canals normal,  oropharynx with mild erythema, no thyromegaly, trachea midline, no cervical or supraclavicular lymphadenopathy LUNGS: Clear to auscultation without wheeze or crackles, unlabored breathing, good air entry bilaterally CARDIOVASCULAR: Normal S1,S2 without murmurs, no edema SKIN: warm, dry        Assessment & Plan:   See Problem List for Assessment and Plan of chronic medical problems.

## 2017-09-13 ENCOUNTER — Ambulatory Visit: Payer: Federal, State, Local not specified - PPO | Admitting: Internal Medicine

## 2017-09-13 ENCOUNTER — Ambulatory Visit (INDEPENDENT_AMBULATORY_CARE_PROVIDER_SITE_OTHER)
Admission: RE | Admit: 2017-09-13 | Discharge: 2017-09-13 | Disposition: A | Discharge status: Home / Self Care | Payer: Federal, State, Local not specified - PPO | Source: Ambulatory Visit | Attending: Internal Medicine | Admitting: Internal Medicine

## 2017-09-13 ENCOUNTER — Encounter: Payer: Self-pay | Admitting: Internal Medicine

## 2017-09-13 VITALS — BP 134/80 | HR 89 | Temp 98.6°F | Resp 16 | Wt 269.0 lb

## 2017-09-13 DIAGNOSIS — R059 Cough, unspecified: Secondary | ICD-10-CM

## 2017-09-13 DIAGNOSIS — R05 Cough: Secondary | ICD-10-CM | POA: Diagnosis not present

## 2017-09-13 DIAGNOSIS — J471 Bronchiectasis with (acute) exacerbation: Secondary | ICD-10-CM

## 2017-09-13 DIAGNOSIS — R509 Fever, unspecified: Secondary | ICD-10-CM

## 2017-09-13 MED ORDER — LEVOFLOXACIN 750 MG PO TABS: 750.0000 mg | ORAL_TABLET | Freq: Every day | ORAL | 0 refills | Status: DC

## 2017-09-13 NOTE — Assessment & Plan Note (Signed)
She did complete the Levaquin, but she is having recurring symptoms of the bronchiectasis exacerbation In the past she has needed to rounds of Levaquin Chest x-ray today Start Levaquin 750 mg daily for 7-10 days-she has tolerated this well in the past Continue inhalers Call if her symptoms do not improve

## 2017-09-13 NOTE — Patient Instructions (Addendum)
Have a chest x-ray today. Your results will be released to MyChart this weekend.    Medications reviewed and updated.  Changes include starting Levaquin 750 mg daily for 7-10 days.   Your prescription(s) have been submitted to your pharmacy. Please take as directed and contact our office if you believe you are having problem(s) with the medication(s).    Call if no improvement

## 2017-09-13 NOTE — Assessment & Plan Note (Signed)
Concern for possible pneumonia Will check chest x-ray given other symptoms

## 2017-09-13 NOTE — Assessment & Plan Note (Signed)
Cough possibly related to bronchiectasis exacerbation or even pneumonia Chest x-ray today We will give another round of Levaquin-750 mg for 7-10 days Continue inhalers No need for prednisone-her lungs are clear

## 2017-09-14 ENCOUNTER — Encounter: Payer: Self-pay | Admitting: Internal Medicine

## 2017-09-19 ENCOUNTER — Telehealth: Payer: Self-pay | Admitting: Pulmonary Disease

## 2017-09-19 NOTE — Telephone Encounter (Signed)
She can try stopping breo and use albuterol prn.  She should call back if her symptoms gets worse, and then can discuss different options for inhalers.

## 2017-09-19 NOTE — Telephone Encounter (Signed)
Spoke with patient. She is aware of VS' recs. She will stop Breo for now. Nothing else needed at time of call.

## 2017-09-19 NOTE — Telephone Encounter (Signed)
Called and spoke with pt. Pt states she was dx with PNA by PCP 09/13/17 and prescribed Levaquin. Pt states she read  Breo Precautions and it stated that Breo can increased risk of PNA.  Pt is requesting recommendations.   VS please advise. Thanks.

## 2017-12-06 ENCOUNTER — Telehealth: Payer: Self-pay | Admitting: Rheumatology

## 2017-12-06 MED ORDER — METHOTREXATE 2.5 MG PO TABS: 20.0000 mg | ORAL_TABLET | ORAL | 0 refills | Status: DC

## 2017-12-06 NOTE — Telephone Encounter (Signed)
Last Visit: 08/15/17 Next visit:02/18/18 Labs: 08/15/17 cbc/cmp wnl  Left message to advise patient she is due for labs.  Okay to refill 30 day supply per Dr. Estanislado Pandy

## 2017-12-06 NOTE — Telephone Encounter (Signed)
Patient is completely out of MTX. Patient uses [facility] on [address]. Patient is due for it tomorrow, and Sunday. Patient coming in for labs on Monday.

## 2017-12-09 ENCOUNTER — Other Ambulatory Visit: Payer: Self-pay | Admitting: *Deleted

## 2017-12-09 DIAGNOSIS — Z79899 Other long term (current) drug therapy: Secondary | ICD-10-CM | POA: Diagnosis not present

## 2017-12-09 DIAGNOSIS — E559 Vitamin D deficiency, unspecified: Secondary | ICD-10-CM | POA: Diagnosis not present

## 2017-12-10 LAB — CBC WITH DIFFERENTIAL/PLATELET
Basophils Absolute: 7 cells/uL (ref 0–200)
Basophils Relative: 0.1 %
EOS ABS: 90 {cells}/uL (ref 15–500)
Eosinophils Relative: 1.3 %
HEMATOCRIT: 39.7 % (ref 35.0–45.0)
Hemoglobin: 13.3 g/dL (ref 11.7–15.5)
LYMPHS ABS: 1035 {cells}/uL (ref 850–3900)
MCH: 28.9 pg (ref 27.0–33.0)
MCHC: 33.5 g/dL (ref 32.0–36.0)
MCV: 86.1 fL (ref 80.0–100.0)
MPV: 10.4 fL (ref 7.5–12.5)
Monocytes Relative: 8 %
Neutro Abs: 5216 cells/uL (ref 1500–7800)
Neutrophils Relative %: 75.6 %
PLATELETS: 247 10*3/uL (ref 140–400)
RBC: 4.61 10*6/uL (ref 3.80–5.10)
RDW: 14.3 % (ref 11.0–15.0)
Total Lymphocyte: 15 %
WBC: 6.9 10*3/uL (ref 3.8–10.8)
WBCMIX: 552 {cells}/uL (ref 200–950)

## 2017-12-10 LAB — COMPLETE METABOLIC PANEL WITH GFR
AG Ratio: 1.3 (calc) (ref 1.0–2.5)
ALBUMIN MSPROF: 3.9 g/dL (ref 3.6–5.1)
ALKALINE PHOSPHATASE (APISO): 108 U/L (ref 33–130)
ALT: 24 U/L (ref 6–29)
AST: 29 U/L (ref 10–35)
BILIRUBIN TOTAL: 0.7 mg/dL (ref 0.2–1.2)
BUN: 7 mg/dL (ref 7–25)
CO2: 27 mmol/L (ref 20–32)
CREATININE: 0.74 mg/dL (ref 0.50–1.05)
Calcium: 9.6 mg/dL (ref 8.6–10.4)
Chloride: 109 mmol/L (ref 98–110)
GFR, Est African American: 108 mL/min/{1.73_m2} (ref >=60)
GFR, Est Non African American: 93 mL/min/{1.73_m2} (ref >=60)
Globulin: 3.1 g/dL (calc) (ref 1.9–3.7)
Glucose, Bld: 107 mg/dL — ABNORMAL HIGH (ref 65–99)
Potassium: 5.1 mmol/L (ref 3.5–5.3)
SODIUM: 142 mmol/L (ref 135–146)
Total Protein: 7 g/dL (ref 6.1–8.1)

## 2017-12-10 LAB — VITAMIN D 25 HYDROXY (VIT D DEFICIENCY, FRACTURES): Vit D, 25-Hydroxy: 30 ng/mL (ref 30–100)

## 2017-12-10 NOTE — Progress Notes (Signed)
Vit D 50,000 U twice a month. Check levels in 6 months

## 2017-12-11 ENCOUNTER — Ambulatory Visit: Payer: Federal, State, Local not specified - PPO | Admitting: Acute Care

## 2017-12-11 ENCOUNTER — Ambulatory Visit (INDEPENDENT_AMBULATORY_CARE_PROVIDER_SITE_OTHER)
Admission: RE | Admit: 2017-12-11 | Discharge: 2017-12-11 | Disposition: A | Discharge status: Home / Self Care | Payer: Federal, State, Local not specified - PPO | Source: Ambulatory Visit | Attending: Acute Care | Admitting: Acute Care

## 2017-12-11 ENCOUNTER — Telehealth: Payer: Self-pay | Admitting: *Deleted

## 2017-12-11 ENCOUNTER — Encounter: Payer: Self-pay | Admitting: Acute Care

## 2017-12-11 VITALS — BP 122/68 | HR 100 | Ht 68.0 in | Wt 273.0 lb

## 2017-12-11 DIAGNOSIS — J471 Bronchiectasis with (acute) exacerbation: Secondary | ICD-10-CM | POA: Diagnosis not present

## 2017-12-11 DIAGNOSIS — R05 Cough: Secondary | ICD-10-CM | POA: Diagnosis not present

## 2017-12-11 DIAGNOSIS — R059 Cough, unspecified: Secondary | ICD-10-CM

## 2017-12-11 DIAGNOSIS — J479 Bronchiectasis, uncomplicated: Secondary | ICD-10-CM

## 2017-12-11 DIAGNOSIS — E559 Vitamin D deficiency, unspecified: Secondary | ICD-10-CM

## 2017-12-11 LAB — NITRIC OXIDE: Nitric Oxide: 9

## 2017-12-11 MED ORDER — VITAMIN D (ERGOCALCIFEROL) 1.25 MG (50000 UNIT) PO CAPS: 50000.0000 [IU] | ORAL_CAPSULE | ORAL | 1 refills | Status: DC

## 2017-12-11 MED ORDER — DOXYCYCLINE HYCLATE 100 MG PO TABS: 100.0000 mg | ORAL_TABLET | Freq: Two times a day (BID) | ORAL | 0 refills | Status: DC

## 2017-12-11 NOTE — Progress Notes (Addendum)
History of Present Illness Jill Harper is a 52 y.o. female with bronchiectasis in setting of RA, and polymyositis. She is on maintenance MTX per Rheumatology.  She is followed by [name].   12/17/2017 Acute OV: Pt presents for an acute OV. She states she has had  an increase in mucus, light Mccutchan, and increasing cough and  Fatigue. She states she had a trial of Breo after her last visit with [name]. She stopped taking it as she was told by her pharmacist that Memory Dance was not a good inhaler when you are recovering from pneumonia. She was told to stop Breo and she has been using her rescue albuterol 5 of the last 7 days 2 puffs at least twice daily.. She is not using any maintenance inhaler at present. She states she was doing well on Qvar.  She has pneumonia in 09/2017. She feels like she is finally recovering from that illness.She thinks she is having a flare of her bronchiectasis. Secretions are dark yellow to light Villalva. She denies any fever.She has a cough. She is not taking mucinex or delsym. She has not been using her flutter valve.She denies fever, chest pain, orthopnea or hemoptysis.  Test Results: 12/11/2017 CXR IMPRESSION: Bibasilar atelectasis and chronic peribronchial opacity without acute airspace disease.   Chest imaging: CT chest 03/23/05 >> patchy b/l lower lung ASD with cylindrical BTX CT chest 02/17/08 >> peripheral and basilar predominant subpleural GGO CT chest 12/01/08 >> no change HRCT chest 09/07/15 >> scattered GGO  Pulmonary tests: PFT 10/30/11 >> FEV1 3.31 (114%), FEV1% 85, TLC 6.06 (108%), DLCO 77%, no BD PFT 11/18/12 >> FEV1 3.18 (111%), FEV1% 84, TLC 5.34 (95%), DLCO 67%, no BD PFT 12/02/15 >> FEV1 2.96 (108%), FEV1% 92, TLC 4.95 (86%), DLCO 65%, no BD Serology 01/31/17 >> ANA negative, RF 29, anti CCP 213, SCL 70 negative, SSA/SSB negative, ENA SM negative, Jo-1 positive Quantiferon gold 01/31/17 >> negative  Cardiac tests: Echo 05/29/17 >> EF 60 to 65%, mod LVH,  grade 1 DD  CBC Latest Ref Rng & Units 12/09/2017 08/15/2017 05/07/2017  WBC 3.8 - 10.8 Thousand/uL 6.9 7.5 4.0  Hemoglobin 11.7 - 15.5 g/dL 13.3 13.7 13.7  Hematocrit 35.0 - 45.0 % 39.7 41.7 40.7  Platelets 140 - 400 Thousand/uL 247 270 229    BMP Latest Ref Rng & Units 12/09/2017 08/15/2017 05/07/2017  Glucose 65 - 99 mg/dL 107(H) 75 102(H)  BUN 7 - 25 mg/dL 7 8 9   Creatinine 0.50 - 1.05 mg/dL 0.74 0.67 0.76  BUN/Creat Ratio 6 - 22 (calc) NOT APPLICABLE NOT APPLICABLE NOT APPLICABLE  Sodium 944 - 146 mmol/L 142 140 140  Potassium 3.5 - 5.3 mmol/L 5.1 4.4 4.7  Chloride 98 - 110 mmol/L 109 107 108  CO2 20 - 32 mmol/L 27 27 26   Calcium 8.6 - 10.4 mg/dL 9.6 9.3 9.4    BNP No results found for: BNP  ProBNP    Component Value Date/Time   PROBNP 58.0 04/18/2009 1157    PFT    Component Value Date/Time   FEV1PRE 2.97 12/02/2015 1156   FEV1POST 2.96 12/02/2015 1156   FVCPRE 3.40 12/02/2015 1156   FVCPOST 3.24 12/02/2015 1156   TLC 4.95 12/02/2015 1156   DLCOUNC 19.92 12/02/2015 1156   PREFEV1FVCRT 87 12/02/2015 1156   PSTFEV1FVCRT 92 12/02/2015 1156    Dg Chest 2 View  Result Date: 12/11/2017 CLINICAL DATA:  Productive cough EXAM: CHEST - 2 VIEW COMPARISON:  09/13/2017 FINDINGS: Persistent  bibasilar atelectasis. Chronic peribronchial opacities are unchanged. Normal cardiomediastinal contours. No focal consolidation. No pleural effusion or pneumothorax. IMPRESSION: Bibasilar atelectasis and chronic peribronchial opacity without acute airspace disease. Electronically Signed   By: Ulyses Jarred M.D.   On: 12/11/2017 15:27     Past medical hx Past Medical History:  Diagnosis Date  . Abnormal liver function test   . Bronchiectasis       . Fibromyalgia   . Menorrhagia   . MIGRAINE HEADACHE   . MVP (mitral valve prolapse)   . Polymyositis ([place])    Dr [name]; chronic MTX     Social History   Tobacco Use  . Smoking status: Never Smoker  . Smokeless tobacco: Never  Used  . Tobacco comment: Married, lives with spouse. 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 Alcohol use: No  . Drug use: No    [name] reports that she has never smoked. She has never used smokeless tobacco. She reports that she does not drink alcohol or use drugs.  Tobacco Cessation: Never smoker  Past surgical hx, Family hx, Social hx all reviewed.  Current Outpatient Medications on File Prior to Visit  Medication Sig  . albuterol (PROVENTIL HFA) 108 (90 Base) MCG/ACT inhaler Inhale 2 puffs into the lungs 4 (four) times daily as needed. Reported on 09/03/2015  . atenolol (TENORMIN) 25 MG tablet TAKE 1/2 TABLET BY MOUTH DAILY AS NEEDED FOR SYMPTOMS OF MITRAL PROLAPSE  . cyclobenzaprine (FLEXERIL) 10 MG tablet Take 5 mg by mouth at bedtime as needed. Reported on 09/03/2015  . diclofenac sodium (VOLTAREN) 1 % GEL Apply 2 g topically 4 (four) times daily as needed (pain).  [name] eletriptan (RELPAX) 20 MG tablet One tablet by mouth at onset of headache. May repeat in 2 hours if headache persists or recurs.  . fluticasone furoate-vilanterol (BREO ELLIPTA) 100-25 MCG/INH AEPB Inhale 1 puff into the lungs daily.  . folic acid (FOLVITE) 1 MG tablet Take 2 tablets (2 mg total) by mouth daily.  [name] levofloxacin (LEVAQUIN) 750 MG tablet Take 1 tablet (750 mg total) by mouth daily.  . methotrexate (RHEUMATREX) 2.5 MG tablet Take 8 tablets (20 mg total) by mouth once a week. Caution:Chemotherapy. Protect from light.  [name] Respiratory Therapy Supplies (FLUTTER) DEVI Use as directed  . traMADol (ULTRAM) 50 MG tablet Take 50 mg by mouth every 6 (six) hours as needed for moderate pain. Reported on 09/03/2015  . Vitamin D, Ergocalciferol, (DRISDOL) 50000 units CAPS capsule Take 1 capsule (50,000 Units total) by mouth 2 (two) times a week.   No current facility-administered medications on file prior to visit.      Allergies  Allergen Reactions  .  Sulfonamide Derivatives     REACTION: hives    Review Of Systems:  Constitutional:   No  weight loss, night sweats,  Fevers, chills, fatigue, or  lassitude.  HEENT:   No headaches,  Difficulty swallowing,  Tooth/dental problems, or  Sore throat,                No sneezing, itching, ear ache, nasal congestion, post nasal drip,   CV:  No chest pain,  Orthopnea, PND, swelling in lower extremities, anasarca, dizziness, palpitations, syncope.   GI  No heartburn, indigestion, abdominal pain, nausea, vomiting, diarrhea, change in bowel habits, loss of appetite, bloody stools.   Resp: No shortness of breath with exertion or at rest.  No excess mucus,  no productive cough,  No non-productive cough,  No coughing up of blood.  No change in color of mucus.  No wheezing.  No chest wall deformity  Skin: no rash or lesions.  GU: no dysuria, change in color of urine, no urgency or frequency.  No flank pain, no hematuria   MS:  No joint pain or swelling.  No decreased range of motion.  No back pain.  Psych:  No change in mood or affect. No depression or anxiety.  No memory loss.   Vital Signs BP 122/68 (BP Location: Left Arm, Cuff Size: Normal)   Pulse 100   Ht 5\' 8"  (1.727 m)   Wt 273 lb (123.8 kg)   SpO2 98%   BMI 41.51 kg/m    Physical Exam:  General- No distress,  A&Ox3 ENT: No sinus tenderness, TM clear, pale nasal mucosa, no oral exudate,no post nasal drip, no LAN Cardiac: S1, S2, regular rate and rhythm, no murmur Chest: No wheeze/ rales/ dullness; no accessory muscle use, no nasal flaring, no sternal retractions Abd.: Soft Non-tender Ext: No clubbing cyanosis, edema Neuro:  normal strength Skin: No rashes, warm and dry Psych: normal mood and behavior   Assessment/Plan  Bronchiectasis with acute exacerbation (HCC) Increase in sputum Fricke to tan Cough Plan: FENO>> 9 ppb We will do a CXR today.( Stat) We will call you with results. Doxycycline 100 mg twice  daily. Probiotic with antibiotic Flutter valve 4 puffs 4 times a day. Mucinex 1200 mg once daily. ( With full glass of water). Delsym every 12 hours for cough. Resume Breo 1 puff once daily if CXR ok Follow up in 1 month with [name] or Judson Roch NP to ensure you are doing well. When you are better we will culture your sputum to help determine best antibiotic to use for flares.      [name], NP 12/17/2017  5:38 PM

## 2017-12-11 NOTE — Telephone Encounter (Signed)
-----   Message from Bo Merino, MD sent at 12/10/2017 12:54 PM EDT ----- Vit D 50,000 U twice a month. Check levels in 6 months

## 2017-12-11 NOTE — Assessment & Plan Note (Signed)
Increase in sputum Straub to tan Cough Plan: FENO>> 9 ppb We will do a CXR today.( Stat) We will call you with results. Doxycycline 100 mg twice daily. Probiotic with antibiotic Flutter valve 4 puffs 4 times a day. Mucinex 1200 mg once daily. ( With full glass of water). Delsym every 12 hours for cough. Resume Breo 1 puff once daily if CXR ok Follow up in 1 month with [name] or Judson Roch NP to ensure you are doing well. When you are better we will culture your sputum to help determine best antibiotic to use for flares.

## 2017-12-11 NOTE — Patient Instructions (Addendum)
It is good to see you today. FENO>> 9 ppb We will do a CXR today.( Stat) We will call you with results. Doxycycline 100 mg twice daily. Probiotic with antibiotic Flutter valve 4 puffs 4 times a day. Mucinex 1200 mg once daily. ( With full glass of water). Delsym every 12 hours for cough. Resume Breo 1 puff once daily if CXR ok Follow up in 1 month with [name] or Judson Roch NP to ensure you are doing well. When you are better we will culture your sputum to help determine best antibiotic to use for flares.

## 2018-01-09 ENCOUNTER — Other Ambulatory Visit: Payer: Self-pay | Admitting: Rheumatology

## 2018-01-10 NOTE — Telephone Encounter (Signed)
Last Visit: 08/15/17 Next visit: 02/18/18 Labs: 12/09/17 cbc/cmp WNL  Okay to refill per Dr. Estanislado Pandy

## 2018-01-15 ENCOUNTER — Ambulatory Visit: Payer: Federal, State, Local not specified - PPO | Admitting: Acute Care

## 2018-01-15 ENCOUNTER — Encounter: Payer: Self-pay | Admitting: Acute Care

## 2018-01-15 DIAGNOSIS — J479 Bronchiectasis, uncomplicated: Secondary | ICD-10-CM | POA: Diagnosis not present

## 2018-01-15 MED ORDER — FLUTICASONE FUROATE-VILANTEROL 100-25 MCG/INH IN AEPB: 1.0000 | INHALATION_SPRAY | Freq: Every day | RESPIRATORY_TRACT | 5 refills | Status: DC

## 2018-01-15 NOTE — Progress Notes (Signed)
History of Present Illness Jill Harper is a 52 y.o. female  with bronchiectasis in setting of RA, and polymyositis. She is on maintenance MTX per Rheumatology. She is followed by [name].  01/15/2018  Pt. Presents for follow up. She was seen 12/11/2017 for an acute visit.She had an increase in mucus, light Zell, and increasing cough and  Fatigue. She had stopped taking her Breo maintenance inhaler.CXR was without acute changes. She was treated with Doxycycline 100 mg twice daily. Probiotic with antibiotic Flutter valve 4 puffs 4 times a day. Mucinex 1200 mg once daily. ( With full glass of water). Delsym every 12 hours for cough. She continues to have cough. She presents for follow-up.  She states she is doing better. But she does have her normal productive cough that she has with her bronchiectasis. Sputum is clear to yellow, which is normal for her.She does states she feels like she is much better.  She has been compliant with her flutter valve 4 times a day in addition to her Mucinex.  She completed her doxycycline antibiotic course regimen.  She states that her cough is so much better she is no longer having to use the Delsym.  She denies fever, chest pain, orthopnea or hemoptysis.   Test Results:  12/11/2017 CXR IMPRESSION: Bibasilar atelectasis and chronic peribronchial opacity without acute airspace disease.   Chest imaging: CT chest 03/23/05 >> patchy b/l lower lung ASD with cylindrical BTX CT chest 02/17/08 >> peripheral and basilar predominant subpleural GGO CT chest 12/01/08 >> no change HRCT chest 09/07/15 >> scattered GGO  Pulmonarytests: PFT 10/30/11 >> FEV1 3.31 (114%), FEV1% 85, TLC 6.06 (108%), DLCO 77%, no BD PFT 11/18/12 >> FEV1 3.18 (111%), FEV1% 84, TLC 5.34 (95%), DLCO 67%, no BD PFT 12/02/15 >> FEV1 2.96 (108%), FEV1% 92, TLC 4.95 (86%), DLCO 65%, no BD Serology 01/31/17 >> ANA negative, RF 29, anti CCP 213, SCL 70 negative, SSA/SSB negative, ENA SM negative, Jo-1  positive Quantiferon gold 01/31/17 >> negative  Cardiac tests: Echo 05/29/17 >> EF 60 to 65%, mod LVH, grade 1 DD   CBC Latest Ref Rng & Units 12/09/2017 08/15/2017 05/07/2017  WBC 3.8 - 10.8 Thousand/uL 6.9 7.5 4.0  Hemoglobin 11.7 - 15.5 g/dL 13.3 13.7 13.7  Hematocrit 35.0 - 45.0 % 39.7 41.7 40.7  Platelets 140 - 400 Thousand/uL 247 270 229    BMP Latest Ref Rng & Units 12/09/2017 08/15/2017 05/07/2017  Glucose 65 - 99 mg/dL 107(H) 75 102(H)  BUN 7 - 25 mg/dL 7 8 9   Creatinine 0.50 - 1.05 mg/dL 0.74 0.67 0.76  BUN/Creat Ratio 6 - 22 (calc) NOT APPLICABLE NOT APPLICABLE NOT APPLICABLE  Sodium 161 - 146 mmol/L 142 140 140  Potassium 3.5 - 5.3 mmol/L 5.1 4.4 4.7  Chloride 98 - 110 mmol/L 109 107 108  CO2 20 - 32 mmol/L 27 27 26   Calcium 8.6 - 10.4 mg/dL 9.6 9.3 9.4    BNP No results found for: BNP  ProBNP    Component Value Date/Time   PROBNP 58.0 04/18/2009 1157    PFT    Component Value Date/Time   FEV1PRE 2.97 12/02/2015 1156   FEV1POST 2.96 12/02/2015 1156   FVCPRE 3.40 12/02/2015 1156   FVCPOST 3.24 12/02/2015 1156   TLC 4.95 12/02/2015 1156   DLCOUNC 19.92 12/02/2015 1156   PREFEV1FVCRT 87 12/02/2015 1156   PSTFEV1FVCRT 92 12/02/2015 1156    No results found.   Past medical hx Past Medical History:  Diagnosis Date  . Abnormal liver function test   . Bronchiectasis       . Fibromyalgia   . Menorrhagia   . MIGRAINE HEADACHE   . MVP (mitral valve prolapse)   . Polymyositis ([place])    Dr [name]; chronic MTX     Social History   Tobacco Use  . Smoking status: Never Smoker  . Smokeless tobacco: Never Used  . Tobacco comment: Married, lives with spouse. 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 Alcohol use: No  . Drug use: No    Jill Harper reports that she has never smoked. She has never used smokeless tobacco. She reports that she does not drink alcohol or use drugs.  Tobacco  Cessation: Never smoker  Past surgical hx, Family hx, Social hx all reviewed.  Current Outpatient Medications on File Prior to Visit  Medication Sig  . albuterol (PROVENTIL HFA) 108 (90 Base) MCG/ACT inhaler Inhale 2 puffs into the lungs 4 (four) times daily as needed. Reported on 09/03/2015  . atenolol (TENORMIN) 25 MG tablet TAKE 1/2 TABLET BY MOUTH DAILY AS NEEDED FOR SYMPTOMS OF MITRAL PROLAPSE  . cyclobenzaprine (FLEXERIL) 10 MG tablet Take 5 mg by mouth at bedtime as needed. Reported on 09/03/2015  . diclofenac sodium (VOLTAREN) 1 % GEL Apply 2 g topically 4 (four) times daily as needed (pain).  [name] eletriptan (RELPAX) 20 MG tablet One tablet by mouth at onset of headache. May repeat in 2 hours if headache persists or recurs.  . fluticasone furoate-vilanterol (BREO ELLIPTA) 100-25 MCG/INH AEPB Inhale 1 puff into the lungs daily.  . folic acid (FOLVITE) 1 MG tablet Take 2 tablets (2 mg total) by mouth daily.  [name] levofloxacin (LEVAQUIN) 750 MG tablet Take 1 tablet (750 mg total) by mouth daily.  . methotrexate (RHEUMATREX) 2.5 MG tablet TAKE 8 TABLETS BY MOUTH ONCE A WEEK. CAUTION: CHEMOTHERAPY. PROTECT FROM LIGHT.  [name] Respiratory Therapy Supplies (FLUTTER) DEVI Use as directed  . traMADol (ULTRAM) 50 MG tablet Take 50 mg by mouth every 6 (six) hours as needed for moderate pain. Reported on 09/03/2015  . Vitamin D, Ergocalciferol, (DRISDOL) 50000 units CAPS capsule Take 1 capsule (50,000 Units total) by mouth every 14 (fourteen) days.   No current facility-administered medications on file prior to visit.      Allergies  Allergen Reactions  . Sulfonamide Derivatives     REACTION: hives    Review Of Systems:  Constitutional:   No  weight loss, night sweats,  Fevers, chills, fatigue, or  lassitude.  HEENT:   No headaches,  Difficulty swallowing,  Tooth/dental problems, or  Sore throat,                No sneezing, itching, ear ache, nasal congestion, post nasal drip,   CV:  No chest  pain,  Orthopnea, PND, swelling in lower extremities, anasarca, dizziness, palpitations, syncope.   GI  No heartburn, indigestion, abdominal pain, nausea, vomiting, diarrhea, change in bowel habits, loss of appetite, bloody stools.   Resp: No shortness of breath with exertion or at rest.  + Baseline  mucus, + baseline productive cough,  + Baseline  non-productive cough,  No coughing up of blood.  No change in color of mucus.  No wheezing.  No chest wall deformity  Skin: no rash or lesions.  GU: no dysuria, change in color of urine, no urgency or frequency.  No flank pain, no hematuria  MS:  No joint pain or swelling.  No decreased range of motion.  No back pain.  Psych:  No change in mood or affect. No depression or anxiety.  No memory loss.   Vital Signs BP 126/80 (BP Location: Left Arm, Cuff Size: Normal)   Pulse 72   Ht 5\' 8"  (1.727 m)   Wt 273 lb 3.2 oz (123.9 kg)   SpO2 98%   BMI 41.54 kg/m    Physical Exam:  General- No distress,  A&Ox3, pleasant ENT: No sinus tenderness, TM clear, pale nasal mucosa, no oral exudate,no post nasal drip, no LAN Cardiac: S1, S2, regular rate and rhythm, no murmur Chest: No wheeze/ rales/ dullness; no accessory muscle use, no nasal flaring, no sternal retractions Abd.: Soft Non-tender, BS +, ND, Body mass index is 41.54 kg/m. Ext: No clubbing cyanosis, edema Neuro:  normal strength Skin: No rashes, warm and dry Psych: normal mood and behavior   Assessment/Plan  BRONCHIECTASIS Improved bronchiectasis after treatment with doxy, and addition of flutter valve. Plan: Flutter valve 4 puffs 4 times a day. Mucinex 1200 mg once daily. ( With full glass of water). Delsym every 12 hours for cough. As needed. Breo 1 puff once daily>> We will send in prescription Rinse mouth after use. Follow up with [name] or Judson Roch NP in 6 months Please contact office for sooner follow up if symptoms do not improve or worsen or seek emergency care       [name], NP 01/15/2018  3:42 PM

## 2018-01-15 NOTE — Patient Instructions (Addendum)
It is nice to see you today. Flutter valve 4 puffs 4 times a day. Mucinex 1200 mg once daily. ( With full glass of water). Delsym every 12 hours for cough. As needed. Breo 1 puff once daily>> We will send in prescription Rinse mouth after use. Follow up with [name] or Judson Roch NP in 6 months Please contact office for sooner follow up if symptoms do not improve or worsen or seek emergency care

## 2018-01-15 NOTE — Assessment & Plan Note (Signed)
Improved bronchiectasis after treatment with doxy, and addition of flutter valve. Plan: Flutter valve 4 puffs 4 times a day. Mucinex 1200 mg once daily. ( With full glass of water). Delsym every 12 hours for cough. As needed. Breo 1 puff once daily>> We will send in prescription Rinse mouth after use. Follow up with [name] or Judson Roch NP in 6 months Please contact office for sooner follow up if symptoms do not improve or worsen or seek emergency care

## 2018-02-04 NOTE — Progress Notes (Signed)
Office Visit Note  Patient: Jill Harper             Date of Birth: [birth year]           MRN: [id number]             PCP: Binnie Rail, MD Referring: Binnie Rail, MD Visit Date: 02/18/2018 Occupation: @GUAROCC @    Subjective:  Myalgias   History of Present Illness: Jill Harper is a 52 y.o. female with history of seropositive rheumatoid arthritis, polymyositis, osteoarthritis, and fibromyalgia.  She takes methotrexate 8 tablets by mouth once weekly and folic acid 2 mg daily.  Patient reports that overall her rheumatoid arthritis has been well controlled.  Denies any recent flares.  She reports that she was off of methotrexate in February due to being diagnosed with the flu as well as pneumonia been treating with Levaquin x2.  She states she also was diagnosed with bronchitis and treated with doxycycline in May 2019.  She denies any shortness of breath at this time.  She states she followed up with [name] as well as Eric Form, NP.  She has a repeat CXR that was stable.  She denies any other recent infections.  She restarted on MTX when the infection resolved.  She states she has been having some pain in bilateral wrist joint and bilateral ankle joints.  She reports that 3 weeks ago for a period of 3 days she had significant muscle aches and joint pain in the lower extremities.  She describes the pain as constant intense dull ache. She denies lower extremity weakness.  She denies any difficulty getting up from a chair.  She reports that she took Tylenol and use Voltaren gel which provided very minimal relief.  She states before taking Tylenol her pain is a 9-10 and after taking Tylenol it was a 7 out of 10.  She reports upper extremity muscle weakness and muscle aches.  She denies any difficulty lifting her arms.  She continues to have generalized muscle aches and muscle tenderness due to fibromyalgia.  She continues to have chronic insomnia and wakes up several times during the night.  She  states that she feels her fatigue is also been worsening.  She states that when she is in the sun given for short amount of time she develops a rash on her chest as well as increased myalgias and fatigue.  She states she has been trying to wear sun protective clothing and limiting the amount of time she is outside.    Activities of Daily Living:  Patient reports morning stiffness for 5 minutes.   Patient Reports nocturnal pain.  Difficulty dressing/grooming: Denies Difficulty climbing stairs: Denies Difficulty getting out of chair: Reports Difficulty using hands for taps, buttons, cutlery, and/or writing: Denies   Review of Systems  Constitutional: Positive for fatigue.  HENT: Positive for mouth sores. Negative for trouble swallowing, trouble swallowing, mouth dryness and nose dryness.   Eyes: Negative for pain, visual disturbance and dryness.  Respiratory: Negative for cough, hemoptysis, shortness of breath and difficulty breathing.   Cardiovascular: Negative for chest pain, palpitations, hypertension and swelling in legs/feet.  Gastrointestinal: Negative for abdominal pain, blood in stool, constipation and diarrhea.  Endocrine: Negative for increased urination.  Genitourinary: Negative for painful urination and pelvic pain.  Musculoskeletal: Positive for arthralgias, joint pain, joint swelling, muscle weakness and morning stiffness. Negative for myalgias, muscle tenderness and myalgias.  Skin: Positive for sensitivity to sunlight. Negative for  color change, pallor, rash, hair loss, nodules/bumps, skin tightness and ulcers.  Allergic/Immunologic: Negative for susceptible to infections.  Neurological: Negative for dizziness, light-headedness, numbness, headaches and memory loss.  Hematological: Negative for swollen glands.  Psychiatric/Behavioral: Positive for sleep disturbance. Negative for depressed mood and confusion. The patient is not nervous/anxious.     PMFS History:  Patient  Active Problem List   Diagnosis Date Noted  . Fever 09/13/2017  . Bronchiectasis with acute exacerbation ([place]) 09/02/2017  . Influenza A 09/02/2017  . LVH (left ventricular hypertrophy) 06/02/2017  . Mitral regurgitation 05/22/2017  . Wheezing 04/05/2017  . Jo-1 antibody positive 01/29/2017  . Primary osteoarthritis of both feet 01/29/2017  . Primary osteoarthritis of both hands 01/29/2017  . Other fatigue 01/29/2017  . Trapezoid ligament sprain 01/17/2017  . Left shoulder tendonitis 01/17/2017  . Vitamin D deficiency 04/26/2016  . High risk medication use 03/26/2016  . Frozen shoulder, right 03/26/2016  . Morbid obesity ([place]) 03/26/2016  . Cough 07/18/2015  . Neck mass 08/27/2013  . MVP (mitral valve prolapse)   . Erythema nodosum   . Throat clearing 05/07/2012  . INCONTINENCE, URGE 05/26/2010  . Migraine headache 02/15/2010  . Fibromyalgia 06/20/2009  . Polymyositis ([place]) 02/11/2008  . BRONCHIECTASIS 02/10/2008    Past Medical History:  Diagnosis Date  . Abnormal liver function test   . Bronchiectasis       . Fibromyalgia   . Menorrhagia   . MIGRAINE HEADACHE   . MVP (mitral valve prolapse)   . Polymyositis ([place])    Dr [name]; chronic MTX    Family History  Problem Relation Age of Onset  . Diabetes Mother   . Fibromyalgia Mother   . Ulcers Mother   . Multiple myeloma Father   . Lupus Sister   . Other Sister        abdominal adhesions resulting in bowel obstruction  . Heart disease Brother   . Rheum arthritis Sister   . Hypertension Sister   . Heart attack Sister   . Diabetes Sister   . Hypertension Sister   . Rheum arthritis Sister   . Diabetes Sister   . Diabetes Brother    Past Surgical History:  Procedure Laterality Date  . CARDIAC CATHETERIZATION  2002   normal  . DILATION AND CURETTAGE OF UTERUS  08-31-08   Dr [name]   Social History   Social History Narrative   Exercise: trying to walk - limited by fatigue      Objective: Vital Signs: BP 124/74 (BP Location: Left Arm, Patient Position: Sitting, Cuff Size: Large)   Pulse 73   Resp 16   Ht 5' 8"  (1.727 m)   Wt 272 lb (123.4 kg)   BMI 41.36 kg/m    Physical Exam  Constitutional: She is oriented to person, place, and time. She appears well-developed and well-nourished.  HENT:  Head: Normocephalic and atraumatic.  Eyes: Conjunctivae and EOM are normal.  Neck: Normal range of motion.  Cardiovascular: Normal rate, regular rhythm, normal heart sounds and intact distal pulses.  Pulmonary/Chest: Effort normal and breath sounds normal.  Crackles in bilateral lung bases   Abdominal: Soft. Bowel sounds are normal.  Lymphadenopathy:    She has no cervical adenopathy.  Neurological: She is alert and oriented to person, place, and time.  Skin: Skin is warm and dry. Capillary refill takes less than 2 seconds.  Psychiatric: She has a normal mood and affect. Her behavior is normal.  Nursing note and  vitals reviewed.    Musculoskeletal Exam: C-spine, thoracic spine, and lumbar spine good ROM.  No midline spinal tenderness.  No SI joint tenderness.  Shoulder joints, elbow joints, wrist joints, MCPs, PIPs, and DIPs good ROM with no synovitis.  Tenderness of muscles in upper extremities.  5/5 strength of upper and lower extremities. Hip joints, knee joints, ankle joints, MTPs, PIPs, and DIPs good ROM with no synovitis. No warmth or effusion of knee joints. No tenderness of trochanteric bursa bilaterally.    CDAI Exam: CDAI Homunculus Exam:   Joint Counts:  CDAI Tender Joint count: 0 CDAI Swollen Joint count: 0  Global Assessments:  Patient Global Assessment: 4 Provider Global Assessment: 4  CDAI Calculated Score: 8    Investigation: No additional findings. CBC Latest Ref Rng & Units 12/09/2017 08/15/2017 05/07/2017  WBC 3.8 - 10.8 Thousand/uL 6.9 7.5 4.0  Hemoglobin 11.7 - 15.5 g/dL 13.3 13.7 13.7  Hematocrit 35.0 - 45.0 % 39.7 41.7 40.7   Platelets 140 - 400 Thousand/uL 247 270 229   CMP Latest Ref Rng & Units 12/09/2017 08/15/2017 05/07/2017  Glucose 65 - 99 mg/dL 107(H) 75 102(H)  BUN 7 - 25 mg/dL 7 8 9   Creatinine 0.50 - 1.05 mg/dL 0.74 0.67 0.76  Sodium 135 - 146 mmol/L 142 140 140  Potassium 3.5 - 5.3 mmol/L 5.1 4.4 4.7  Chloride 98 - 110 mmol/L 109 107 108  CO2 20 - 32 mmol/L 27 27 26   Calcium 8.6 - 10.4 mg/dL 9.6 9.3 9.4  Total Protein 6.1 - 8.1 g/dL 7.0 7.6 7.0  Total Bilirubin 0.2 - 1.2 mg/dL 0.7 0.4 0.7  Alkaline Phos 33 - 130 U/L - - -  AST 10 - 35 U/L 29 18 56(H)  ALT 6 - 29 U/L 24 12 53(H)    Imaging: No results found.  Speciality Comments: No specialty comments available.    Procedures:  No procedures performed Allergies: Sulfonamide derivatives   Assessment / Plan:     Visit Diagnoses: Rheumatoid arthritis involving multiple sites with positive rheumatoid factor (HCC) - Positive RF, positive anti-CCP: She has no active synovitis on exam.  She has not had any recent rheumatoid arthritis flares.  She reports joint pain in bilateral wrists and bilateral ankles but no synovitis was noted.  She is clinically doing well on methotrexate 8 tablets by mouth once weekly and folic acid 2 mg by mouth daily.  Refill folic acid was sent to the pharmacy today.  She is advised to notify us if she develops increased joint pain or joint swelling.  Polymyositis ([place]) - Diagnosed by Dr. Charlestine Night 2007 CK 801, Jo 1 positive.  Patient gives a history of increased myalgias and weakness of the upper extremities.  She had no difficulty raising her arms and has 5 out of 5 strength of upper and lower extremities.  She has no difficulty getting up from a chair at this time.  We will check a CK, sed rate, and TSH today.  We will call her with the results and make any necessary adjustments at that time.- Plan: CK, Sedimentation rate, TSH  High risk medication use  - MTX 8 tablets by mouth every week, folic acid 2 mg by mouth daily.   CBC and CMP are drawn today to monitor for drug toxicity.  She will return in October and every 3 months for lab work.- Plan: CBC with Differential/Platelet, COMPLETE METABOLIC PANEL WITH GFR  Other fatigue: She has chronic fatigue.  Primary osteoarthritis of  both hands: She experiences discomfort in bilateral wrists and bilateral hands on occasion.  She also has stiffness first thing in the morning.  She has no synovitis on exam.  She is complete fist formation bilaterally.  Joint protection and muscle strengthening were discussed.  Primary osteoarthritis of both feet: She has no discomfort in her feet at this time.  She was proper fitting shoes.  Fibromyalgia: She continues to have generalized muscle aches and muscle tenderness due to fibromyalgia.  She has not any recent flares of fibromyalgia.  She continues to have chronic insomnia and fatigue.  She was encouraged to exercise on a regular basis.  Bronchiectasis without complication (HCC) - Chronic bronchiectasis treated by [name].  She is diagnosed with the flu and pneumonia in February 2019 and was treated with Levaquin x2.  She was diagnosed with bronchitis in May 2019 and was treated with doxycycline.  She was off of methotrexate during both of these infections.  She resumed MTX once she was treated for the infections.  She had a chest x-ray that did not reveal any acute findings in May 2019.  She continues to follow-up with pulmonology.  She denies any shortness of breath or coughing at this time.  She uses her Brio inhaler.  She has crackles at the bases of bilateral lungs.  Other medical conditions are listed as follows:   Erythema nodosum  History of migraine  History of mitral valve prolapse  History of vitamin D deficiency: She is on a vitamin D supplement.   Orders: Orders Placed This Encounter  Procedures  . CBC with Differential/Platelet  . COMPLETE METABOLIC PANEL WITH GFR  . CK  . Sedimentation rate  . TSH   Meds  ordered this encounter  Medications  . folic acid (FOLVITE) 1 MG tablet    Sig: Take 2 tablets (2 mg total) by mouth daily.    Dispense:  180 tablet    Refill:  3    Face-to-face time spent with patient was 30 minutes. Greater than 50% of time was spent in counseling and coordination of care.  Follow-Up Instructions: Return in about 5 months (around 07/21/2018) for Rheumatoid arthritis, Polymyositis, Osteoarthritis, Fibromyalgia.   Hazel Sams PA-C  I examined and evaluated the patient with Hazel Sams PA.  Patient had no synovitis on my examination.  She had no muscular weakness.  She experienced some tenderness in her muscles.  The plan of care was discussed as noted above.  Bo Merino, MD     Note - This record has been created using Editor, commissioning.  Chart creation errors have been sought, but may not always  have been located. Such creation errors do not reflect on  the standard of medical care.

## 2018-02-18 ENCOUNTER — Ambulatory Visit: Payer: Federal, State, Local not specified - PPO | Admitting: Rheumatology

## 2018-02-18 ENCOUNTER — Encounter: Payer: Self-pay | Admitting: Rheumatology

## 2018-02-18 VITALS — BP 124/74 | HR 73 | Resp 16 | Ht 68.0 in | Wt 272.0 lb

## 2018-02-18 DIAGNOSIS — M19071 Primary osteoarthritis, right ankle and foot: Secondary | ICD-10-CM | POA: Diagnosis not present

## 2018-02-18 DIAGNOSIS — M0579 Rheumatoid arthritis with rheumatoid factor of multiple sites without organ or systems involvement: Secondary | ICD-10-CM | POA: Diagnosis not present

## 2018-02-18 DIAGNOSIS — L52 Erythema nodosum: Secondary | ICD-10-CM | POA: Diagnosis not present

## 2018-02-18 DIAGNOSIS — Z8669 Personal history of other diseases of the nervous system and sense organs: Secondary | ICD-10-CM

## 2018-02-18 DIAGNOSIS — Z79899 Other long term (current) drug therapy: Secondary | ICD-10-CM | POA: Diagnosis not present

## 2018-02-18 DIAGNOSIS — M19072 Primary osteoarthritis, left ankle and foot: Secondary | ICD-10-CM

## 2018-02-18 DIAGNOSIS — M797 Fibromyalgia: Secondary | ICD-10-CM | POA: Diagnosis not present

## 2018-02-18 DIAGNOSIS — Z8679 Personal history of other diseases of the circulatory system: Secondary | ICD-10-CM | POA: Diagnosis not present

## 2018-02-18 DIAGNOSIS — R5383 Other fatigue: Secondary | ICD-10-CM

## 2018-02-18 DIAGNOSIS — Z8639 Personal history of other endocrine, nutritional and metabolic disease: Secondary | ICD-10-CM | POA: Diagnosis not present

## 2018-02-18 DIAGNOSIS — J479 Bronchiectasis, uncomplicated: Secondary | ICD-10-CM | POA: Diagnosis not present

## 2018-02-18 DIAGNOSIS — M19041 Primary osteoarthritis, right hand: Secondary | ICD-10-CM | POA: Diagnosis not present

## 2018-02-18 DIAGNOSIS — M3320 Polymyositis, organ involvement unspecified: Secondary | ICD-10-CM | POA: Diagnosis not present

## 2018-02-18 DIAGNOSIS — M19042 Primary osteoarthritis, left hand: Secondary | ICD-10-CM

## 2018-02-18 MED ORDER — FOLIC ACID 1 MG PO TABS: 2.0000 mg | ORAL_TABLET | Freq: Every day | ORAL | 3 refills | Status: DC

## 2018-02-18 NOTE — Patient Instructions (Addendum)
Standing Labs We placed an order today for your standing lab work.   Please come back and get your standing labs in October and every 3 months   We have open lab Monday through Friday from 8:30-11:30 AM and 1:30-4:00 PM  at the office of Dr. [name] Deveshwar.   You may experience shorter wait times on Monday and Friday afternoons. The office is located at [address], [address], [place], [place] [address] No appointment is necessary.   Labs are drawn by [facility].  You may receive a bill from [facility] for your lab work. If you have any questions regarding directions or hours of operation,  please call [phone].    

## 2018-02-19 LAB — CBC WITH DIFFERENTIAL/PLATELET
BASOS ABS: 27 {cells}/uL (ref 0–200)
BASOS PCT: 0.4 %
EOS ABS: 161 {cells}/uL (ref 15–500)
EOS PCT: 2.4 %
HCT: 41.3 % (ref 35.0–45.0)
HEMOGLOBIN: 13.8 g/dL (ref 11.7–15.5)
Lymphs Abs: 1802 cells/uL (ref 850–3900)
MCH: 28.7 pg (ref 27.0–33.0)
MCHC: 33.4 g/dL (ref 32.0–36.0)
MCV: 85.9 fL (ref 80.0–100.0)
MONOS PCT: 6.9 %
MPV: 10.5 fL (ref 7.5–12.5)
NEUTROS ABS: 4248 {cells}/uL (ref 1500–7800)
Neutrophils Relative %: 63.4 %
PLATELETS: 304 10*3/uL (ref 140–400)
RBC: 4.81 10*6/uL (ref 3.80–5.10)
RDW: 13.7 % (ref 11.0–15.0)
TOTAL LYMPHOCYTE: 26.9 %
WBC mixed population: 462 cells/uL (ref 200–950)
WBC: 6.7 10*3/uL (ref 3.8–10.8)

## 2018-02-19 LAB — COMPLETE METABOLIC PANEL WITH GFR
AG RATIO: 1.2 (calc) (ref 1.0–2.5)
ALKALINE PHOSPHATASE (APISO): 103 U/L (ref 33–130)
ALT: 42 U/L — AB (ref 6–29)
AST: 44 U/L — AB (ref 10–35)
Albumin: 4 g/dL (ref 3.6–5.1)
BUN: 11 mg/dL (ref 7–25)
CHLORIDE: 105 mmol/L (ref 98–110)
CO2: 27 mmol/L (ref 20–32)
Calcium: 9.5 mg/dL (ref 8.6–10.4)
Creat: 0.75 mg/dL (ref 0.50–1.05)
GFR, Est African American: 106 mL/min/{1.73_m2} (ref >=60)
GFR, Est Non African American: 92 mL/min/{1.73_m2} (ref >=60)
GLOBULIN: 3.4 g/dL (ref 1.9–3.7)
Glucose, Bld: 85 mg/dL (ref 65–99)
POTASSIUM: 4.5 mmol/L (ref 3.5–5.3)
SODIUM: 138 mmol/L (ref 135–146)
Total Bilirubin: 0.4 mg/dL (ref 0.2–1.2)
Total Protein: 7.4 g/dL (ref 6.1–8.1)

## 2018-02-19 LAB — TSH: TSH: 4.82 mIU/L — ABNORMAL HIGH

## 2018-02-19 LAB — SEDIMENTATION RATE: SED RATE: 25 mm/h (ref 0–30)

## 2018-02-19 LAB — CK: CK TOTAL: 43 U/L (ref 29–143)

## 2018-02-19 NOTE — Progress Notes (Signed)
CBC WNL.  CK is WNL at 43.  Sed rate WNL.   LFTs are elevated.  Please advise patient to return in 3 weeks to recheck LFTs.   TSH is elevated. Please notify patient and advise her to follow up with PCP due to the elevated TSH possibly contributing to her symptoms.  Please send lab results to PCP.

## 2018-03-18 ENCOUNTER — Other Ambulatory Visit (INDEPENDENT_AMBULATORY_CARE_PROVIDER_SITE_OTHER): Payer: Federal, State, Local not specified - PPO

## 2018-03-18 ENCOUNTER — Ambulatory Visit: Payer: Federal, State, Local not specified - PPO | Admitting: Internal Medicine

## 2018-03-18 ENCOUNTER — Encounter: Payer: Self-pay | Admitting: Internal Medicine

## 2018-03-18 VITALS — BP 110/80 | HR 76 | Ht 68.0 in | Wt 277.0 lb

## 2018-03-18 DIAGNOSIS — R1310 Dysphagia, unspecified: Secondary | ICD-10-CM

## 2018-03-18 DIAGNOSIS — R1111 Vomiting without nausea: Secondary | ICD-10-CM | POA: Diagnosis not present

## 2018-03-18 DIAGNOSIS — R7989 Other specified abnormal findings of blood chemistry: Secondary | ICD-10-CM

## 2018-03-18 LAB — T4, FREE: Free T4: 0.84 ng/dL (ref 0.60–1.60)

## 2018-03-18 LAB — T3, FREE: T3 FREE: 3.9 pg/mL (ref 2.3–4.2)

## 2018-03-18 LAB — TSH: TSH: 5.28 u[IU]/mL — ABNORMAL HIGH (ref 0.35–4.50)

## 2018-03-18 NOTE — Assessment & Plan Note (Addendum)
tfts - tsh, ft3, ft4  Thyroid antibodies [name] of thyroid - h/o abn thyroid imaging 2015-6 Will consider treatment based on results

## 2018-03-18 NOTE — Assessment & Plan Note (Addendum)
Vomiting since PNA in 2018 - sometimes occurs after cough, but not always No associated nausea/pain Occurs randomly - can occur two days in a row and then she can go weeks without vomiting Refer to Dr Collene Mares

## 2018-03-18 NOTE — Assessment & Plan Note (Signed)
Intermittent Thyroid US - unlikely related to thyroid Refer to GI - sees Dr Collene Mares

## 2018-03-18 NOTE — Patient Instructions (Addendum)
  Test(s) ordered today. Your results will be released to [facility] (or called to you) after review, usually within 72hours after test completion. If any changes need to be made, you will be notified at that same time.   Medications reviewed and updated.  No changes recommended at this time.   A referral was ordered for GI - Dr Collene Mares

## 2018-03-18 NOTE — Progress Notes (Signed)
Subjective:    Patient ID: Jill Harper, female    DOB: 04/29/1966, 52 y.o.   MRN: [id number]  HPI The patient is here for follow up.  She had blood work done last month by her rheumatologist and her thyroid was slightly underactive.  Her TSH was 4.82.  She is here for further evaluation.  She has intermittent hair loss or brittleness in her hair for different reasons.  She sometimes has fatigue and muscle aches but thinks it is from other reasons.    Vomiting:  She can vomit at any time of day - it is not always related to eating or coughing/clearing lungs from bronchiectasis.  Sometimes she will do a little cough and bring up a little bit of mucus and sometimes she will have projectile vomiting.  sometimes she then has to cough because the vomit feels logged in her throat.  This past weekend it occurred Saturday and sunday.  She only has one episode at a time - it does not occur mutliple times in one day.   She does not feel sick on her stomach prior.  She feels fatigued after, but then feels normal.  The vomiting does not always occur after a cough.  This started after she had PNA in 2018.  She tried probiotics after that thinking she just need to rebalance her bacteria.  She can go weeks between episodes.  It occurs randomly.     Dysphagia:  A few days ago she turned her head while swallowing and it felt like her throat closed up.  She turned back and took a sip of water and she was ok.  She sometiems has a choked sensation/dysphagia if eating or drinking.  She tries to keep liquid with her at all times - that helps.   Medications and allergies reviewed with patient and updated if appropriate.  Patient Active Problem List   Diagnosis Date Noted  . Elevated TSH 03/18/2018  . Fever 09/13/2017  . Bronchiectasis with acute exacerbation ([place]) 09/02/2017  . LVH (left ventricular hypertrophy) 06/02/2017  . Mitral regurgitation 05/22/2017  . Wheezing 04/05/2017  . Jo-1 antibody positive  01/29/2017  . Primary osteoarthritis of both feet 01/29/2017  . Primary osteoarthritis of both hands 01/29/2017  . Other fatigue 01/29/2017  . Trapezoid ligament sprain 01/17/2017  . Left shoulder tendonitis 01/17/2017  . Vitamin D deficiency 04/26/2016  . High risk medication use 03/26/2016  . Frozen shoulder, right 03/26/2016  . Morbid obesity ([place]) 03/26/2016  . Cough 07/18/2015  . Neck mass 08/27/2013  . MVP (mitral valve prolapse)   . Erythema nodosum   . Throat clearing 05/07/2012  . INCONTINENCE, URGE 05/26/2010  . Migraine headache 02/15/2010  . Fibromyalgia 06/20/2009  . Polymyositis ([place]) 02/11/2008  . BRONCHIECTASIS 02/10/2008    Current Outpatient Medications on File Prior to Visit  Medication Sig Dispense Refill  . albuterol (PROVENTIL HFA) 108 (90 Base) MCG/ACT inhaler Inhale 2 puffs into the lungs 4 (four) times daily as needed. Reported on 09/03/2015 1 Inhaler 4  . atenolol (TENORMIN) 25 MG tablet TAKE 1/2 TABLET BY MOUTH DAILY AS NEEDED FOR SYMPTOMS OF MITRAL PROLAPSE 30 tablet 1  . cyclobenzaprine (FLEXERIL) 10 MG tablet Take 5 mg by mouth at bedtime as needed. Reported on 09/03/2015    . diclofenac sodium (VOLTAREN) 1 % GEL Apply 2 g topically 4 (four) times daily as needed (pain). 300 g 1  . eletriptan (RELPAX) 20 MG tablet One tablet by mouth at  onset of headache. May repeat in 2 hours if headache persists or recurs. 10 tablet 0  . fluticasone furoate-vilanterol (BREO ELLIPTA) 100-25 MCG/INH AEPB Inhale 1 puff into the lungs daily. 60 each 5  . folic acid (FOLVITE) 1 MG tablet Take 2 tablets (2 mg total) by mouth daily. 180 tablet 3  . methotrexate (RHEUMATREX) 2.5 MG tablet TAKE 8 TABLETS BY MOUTH ONCE A WEEK. CAUTION: CHEMOTHERAPY. PROTECT FROM LIGHT. 96 tablet 0  . Respiratory Therapy Supplies (FLUTTER) DEVI Use as directed (Patient taking differently: 4 (four) times daily. Use as directed) 1 each 0  . traMADol (ULTRAM) 50 MG tablet Take 50 mg by mouth every 6  (six) hours as needed for moderate pain. Reported on 09/03/2015    . Vitamin D, Ergocalciferol, (DRISDOL) 50000 units CAPS capsule Take 1 capsule (50,000 Units total) by mouth every 14 (fourteen) days. 6 capsule 1   No current facility-administered medications on file prior to visit.     Past Medical History:  Diagnosis Date  . Abnormal liver function test   . Bronchiectasis       . Fibromyalgia   . Menorrhagia   . MIGRAINE HEADACHE   . MVP (mitral valve prolapse)   . Polymyositis ([place])    Dr [name]; chronic MTX    Past Surgical History:  Procedure Laterality Date  . CARDIAC CATHETERIZATION  2002   normal  . DILATION AND CURETTAGE OF UTERUS  08-31-08   Dr [name]    Social History   Socioeconomic History  . Marital status: Married    Spouse name: Not on file  . Number of children: Not on file  . Years of education: Not on file  . Highest education level: Not on file  Occupational History  . 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 Financial resource strain: Not on file  . Food insecurity:    Worry: Not on file    Inability: Not on file  . Transportation needs:    Medical: Not on file    Non-medical: Not on file  Tobacco Use  . Smoking status: Never Smoker  . Smokeless tobacco: Never Used  . Tobacco comment: Married, lives with spouse. 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 Alcohol use: No  . Drug use: No  . Sexual activity: Yes    Birth control/protection: Surgical  Lifestyle  . Physical activity:    Days per week: Not on file    Minutes per session: Not on file  . Stress: Not on file  Relationships  . Social connections:    Talks on phone: Not on file    Gets together: Not on file    Attends religious service: Not on file    Active member of club or organization: Not on file    Attends meetings of clubs or organizations:  Not on file    Relationship status: Not on file  Other Topics Concern  . Not on file  Social History Narrative   Exercise: trying to walk - limited by fatigue    Family History  Problem Relation Age of Onset  . Diabetes Mother   . Fibromyalgia Mother   . Ulcers Mother   . Multiple myeloma Father   . Lupus Sister   . Other Sister        abdominal adhesions resulting in bowel obstruction  . Heart  disease Brother   . Rheum arthritis Sister   . Hypertension Sister   . Heart attack Sister   . Diabetes Sister   . Hypertension Sister   . Rheum arthritis Sister   . Diabetes Sister   . Diabetes Brother     Review of Systems  Constitutional: Negative for chills and fever.  HENT: Positive for trouble swallowing.   Respiratory: Positive for cough (with bronchiectasis). Negative for shortness of breath and wheezing.   Cardiovascular: Negative for chest pain and palpitations.  Gastrointestinal: Positive for nausea (only related to mtx) and vomiting. Negative for abdominal pain, blood in stool, constipation and diarrhea.       Objective:   Vitals:   03/18/18 1537  BP: 110/80  Pulse: 76  SpO2: 98%   BP Readings from Last 3 Encounters:  03/18/18 110/80  02/18/18 124/74  01/15/18 126/80   Wt Readings from Last 3 Encounters:  03/18/18 277 lb (125.6 kg)  02/18/18 272 lb (123.4 kg)  01/15/18 273 lb 3.2 oz (123.9 kg)   Body mass index is 42.12 kg/m.   Physical Exam    Constitutional: Appears well-developed and well-nourished. No distress.  HENT:  Head: Normocephalic and atraumatic.  Neck: Neck supple. No tracheal deviation present. No thyromegaly present.  No cervical lymphadenopathy Cardiovascular: Normal rate, regular rhythm and normal heart sounds.   No murmur heard. No carotid bruit .  No edema Pulmonary/Chest: Effort normal. No respiratory distress. No has no wheezes. Bibasilar crackles - dry  Skin: Skin is warm and dry. Not diaphoretic.  Psychiatric: Normal mood  and affect. Behavior is normal.      Assessment & Plan:    See Problem List for Assessment and Plan of chronic medical problems.

## 2018-03-19 LAB — THYROID ANTIBODIES: Thyroperoxidase Ab SerPl-aCnc: 2 IU/mL (ref <9)

## 2018-03-22 ENCOUNTER — Encounter: Payer: Self-pay | Admitting: Internal Medicine

## 2018-03-22 DIAGNOSIS — R7989 Other specified abnormal findings of blood chemistry: Secondary | ICD-10-CM

## 2018-03-31 MED ORDER — LEVOTHYROXINE SODIUM 25 MCG PO TABS: 25.0000 ug | ORAL_TABLET | Freq: Every day | ORAL | 5 refills | Status: DC

## 2018-04-01 ENCOUNTER — Telehealth: Payer: Self-pay | Admitting: Pulmonary Disease

## 2018-04-01 ENCOUNTER — Encounter: Payer: Self-pay | Admitting: Pulmonary Disease

## 2018-04-01 ENCOUNTER — Ambulatory Visit: Payer: Federal, State, Local not specified - PPO | Admitting: Pulmonary Disease

## 2018-04-01 VITALS — BP 130/78 | HR 76 | Temp 98.2°F | Ht 67.0 in | Wt 275.8 lb

## 2018-04-01 DIAGNOSIS — R0609 Other forms of dyspnea: Secondary | ICD-10-CM | POA: Diagnosis not present

## 2018-04-01 DIAGNOSIS — R0600 Dyspnea, unspecified: Secondary | ICD-10-CM

## 2018-04-01 DIAGNOSIS — Z0000 Encounter for general adult medical examination without abnormal findings: Secondary | ICD-10-CM

## 2018-04-01 DIAGNOSIS — J479 Bronchiectasis, uncomplicated: Secondary | ICD-10-CM

## 2018-04-01 DIAGNOSIS — J471 Bronchiectasis with (acute) exacerbation: Secondary | ICD-10-CM

## 2018-04-01 MED ORDER — FLUTICASONE FUROATE-VILANTEROL 200-25 MCG/INH IN AEPB: 1.0000 | INHALATION_SPRAY | Freq: Every day | RESPIRATORY_TRACT | 0 refills | Status: DC

## 2018-04-01 NOTE — Progress Notes (Signed)
@Patient  ID: Jill Harper, female    DOB: Nov 11, 1965, 52 y.o.   MRN: [id number]  Chief Complaint  Patient presents with  . Acute Visit    sob  with exertion     Referring provider: Binnie Rail, MD  HPI: 52 year old female followed in our office for bronchiectasis in the setting of rheumatoid arthritis and polymyositis  PMH: Rheumatoid arthritis, polymyositis, maintained on methotrexate per rheumatology Smoker/ Smoking History: Never smoker Maintenance: Breo Ellipta 100 Pt of: [name]  Recent [facility] Encounters:   01/15/2018-office visit- SG Patient presents for follow-up.  She was seen on 12/11/17.  Patient was treated with doxycycline as well as started on a probiotic daily and this.  Patient reports she is doing much better today.   Plan: Continue flutter valve use, continue Mucinex can use Delsym over-the-counter cough medicine, continue Breo Ellipta, follow-up in 6 months   04/01/2018  - Visit   52 year old patient seen for follow-up visit today.  Patient reports that she was seen by 2 of her specialist and noted to have crackles.  Patient also reports increased shortness of breath and dyspnea with exertion over the past couple weeks.  Patient wanted to present today to see why she could be having this crackles.    Tests:   Chest imaging: CT chest 03/23/05 >> patchy b/l lower lung ASD with cylindrical BTX CT chest 02/17/08 >> peripheral and basilar predominant subpleural GGO CT chest 12/01/08 >> no change HRCT chest 09/07/15 >> scattered GGO  Pulmonarytests: PFT 10/30/11 >> FEV1 3.31 (114%), FEV1% 85, TLC 6.06 (108%), DLCO 77%, no BD PFT 11/18/12 >> FEV1 3.18 (111%), FEV1% 84, TLC 5.34 (95%), DLCO 67%, no BD PFT 12/02/15 >> FEV1 2.96 (108%), FEV1% 92, TLC 4.95 (86%), DLCO 65%, no BD Serology 01/31/17 >> ANA negative, RF 29, anti CCP 213, SCL 70 negative, SSA/SSB negative, ENA SM negative, Jo-1 positive Quantiferon gold 01/31/17 >> negative  Cardiac tests: Echo  05/29/17 >> EF 60 to 65%, mod LVH, grade 1 DD     Chart Review:      Allergies  Allergen Reactions  . Sulfonamide Derivatives     REACTION: hives    Immunization History  Administered Date(s) Administered  . Influenza Split 04/10/2011, 06/18/2012, 05/06/2013  . Influenza Whole 05/19/2008, 05/15/2009, 06/01/2010  . Influenza,inj,Quad PF,6+ Mos 04/20/2014, 05/26/2015, 05/31/2016, 04/05/2017  . Pneumococcal Conjugate-13 01/29/2017  . Pneumococcal Polysaccharide-23 08/07/2007  . Td 07/12/2009   >>> Patient received flu vaccine at follow-up appointment next month, patient to receive Pneumovax 23 at follow-up visit next month >>> Patient will need Tdap in December 2020  Past Medical History:  Diagnosis Date  . Abnormal liver function test   . Bronchiectasis       . Fibromyalgia   . Menorrhagia   . MIGRAINE HEADACHE   . MVP (mitral valve prolapse)   . Polymyositis ([place])    Dr [name]; chronic MTX    Tobacco History: Social History   Tobacco Use  Smoking Status Never Smoker  Smokeless Tobacco Never Used  Tobacco Comment   Married, lives with spouse. 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 albuterol (PROVENTIL HFA) 108 (90 Base) MCG/ACT inhaler Inhale 2 puffs into the lungs 4 (four) times daily  as needed. Reported on 09/03/2015  . atenolol (TENORMIN) 25 MG tablet TAKE 1/2 TABLET BY MOUTH DAILY AS NEEDED FOR SYMPTOMS OF MITRAL PROLAPSE  . cyclobenzaprine (FLEXERIL) 10 MG tablet Take 5 mg by mouth at bedtime as needed. Reported on 09/03/2015  . diclofenac sodium (VOLTAREN) 1 % GEL Apply 2 g topically 4 (four) times daily as needed (pain).  [name] eletriptan (RELPAX) 20 MG tablet One tablet by mouth at onset of headache.  May repeat in 2 hours if headache persists or recurs.  . fluticasone furoate-vilanterol (BREO ELLIPTA) 100-25 MCG/INH AEPB Inhale 1 puff into the lungs daily.  . folic acid (FOLVITE) 1 MG tablet Take 2 tablets (2 mg total) by mouth daily.  . methotrexate (RHEUMATREX) 2.5 MG tablet TAKE 8 TABLETS BY MOUTH ONCE A WEEK. CAUTION: CHEMOTHERAPY. PROTECT FROM LIGHT.  [name] Respiratory Therapy Supplies (FLUTTER) DEVI Use as directed (Patient taking differently: 4 (four) times daily. Use as directed)  . traMADol (ULTRAM) 50 MG tablet Take 50 mg by mouth every 6 (six) hours as needed for moderate pain. Reported on 09/03/2015  . Vitamin D, Ergocalciferol, (DRISDOL) 50000 units CAPS capsule Take 1 capsule (50,000 Units total) by mouth every 14 (fourteen) days.  . fluticasone furoate-vilanterol (BREO ELLIPTA) 200-25 MCG/INH AEPB Inhale 1 puff into the lungs daily.  [name] levothyroxine (SYNTHROID, LEVOTHROID) 25 MCG tablet Take 1 tablet (25 mcg total) by mouth daily before breakfast. (Patient not taking: Reported on 04/01/2018)   No facility-administered encounter medications on file as of 04/01/2018.      Review of Systems  Review of Systems  Constitutional: Positive for fatigue. Negative for chills, fever and unexpected weight change.  HENT: Negative for congestion, ear pain and postnasal drip.   Respiratory: Positive for chest tightness and shortness of breath. Negative for cough and wheezing.   Cardiovascular: Negative for chest pain and palpitations.  Gastrointestinal: Negative for blood in stool, diarrhea, nausea and vomiting.  Genitourinary: Negative for dysuria, frequency and urgency.  Musculoskeletal: Negative for arthralgias.  Skin: Negative for color change.  Allergic/Immunologic: Negative for environmental allergies and food allergies.  Neurological: Negative for dizziness, light-headedness and headaches.  Psychiatric/Behavioral: Negative for dysphoric mood. The patient is not nervous/anxious.       Physical Exam  BP 130/78 (BP Location: Left Arm, Cuff Size: Normal)   Pulse 76   Temp 98.2 F (36.8 C) (Oral)   Ht 5\' 7"  (1.702 m)   Wt 275 lb 12.8 oz (125.1 kg)   SpO2 96%   BMI 43.20 kg/m   Wt Readings from Last 5 Encounters:  04/01/18 275 lb 12.8 oz (125.1 kg)  03/18/18 277 lb (125.6 kg)  02/18/18 272 lb (123.4 kg)  01/15/18 273 lb 3.2 oz (123.9 kg)  12/11/17 273 lb (123.8 kg)     Physical Exam  Constitutional: She is oriented to person, place, and time and well-developed, well-nourished, and in no distress. No distress.  HENT:  Head: Normocephalic and atraumatic.  Right Ear: Hearing, tympanic membrane, external ear and ear canal normal.  Left Ear: Hearing, tympanic membrane, external ear and ear canal normal.  Mouth/Throat: Uvula is midline and oropharynx is clear and moist. No oropharyngeal exudate.  Eyes: Pupils are equal, round, and reactive to light.  Neck: Normal range of motion. Neck supple. No JVD present.  Cardiovascular: Normal rate, regular rhythm and normal heart sounds.  Pulmonary/Chest: Effort normal. No accessory muscle usage. No respiratory distress. She has no decreased breath sounds. She has no wheezes. She has no  rhonchi. She has rales (bibasilar crackles L > R).  Musculoskeletal: Normal range of motion. She exhibits no edema.  Lymphadenopathy:    She has no cervical adenopathy.  Neurological: She is alert and oriented to person, place, and time. Gait normal.  Skin: Skin is warm and dry. She is not diaphoretic. No erythema.  Psychiatric: Mood, memory, affect and judgment normal.  Nursing note and vitals reviewed.     Lab Results:  CBC    Component Value Date/Time   WBC 6.7 02/18/2018 1609   RBC 4.81 02/18/2018 1609   HGB 13.8 02/18/2018 1609   HCT 41.3 02/18/2018 1609   PLT 304 02/18/2018 1609   MCV 85.9 02/18/2018 1609   MCH 28.7 02/18/2018 1609   MCHC 33.4 02/18/2018 1609   RDW 13.7 02/18/2018 1609   LYMPHSABS 1,802 02/18/2018 1609    MONOABS 666 01/31/2017 1006   EOSABS 161 02/18/2018 1609   BASOSABS 27 02/18/2018 1609    BMET    Component Value Date/Time   NA 138 02/18/2018 1609   K 4.5 02/18/2018 1609   CL 105 02/18/2018 1609   CO2 27 02/18/2018 1609   GLUCOSE 85 02/18/2018 1609   BUN 11 02/18/2018 1609   CREATININE 0.75 02/18/2018 1609   CALCIUM 9.5 02/18/2018 1609   GFRNONAA 92 02/18/2018 1609   GFRAA 106 02/18/2018 1609    BNP No results found for: BNP  ProBNP    Component Value Date/Time   PROBNP 58.0 04/18/2009 1157    Imaging: No results found.     Assessment & Plan:   Pleasant 51 year old patient seen for follow-up visit today.  Patient with new onset bibasilar crackles left greater than right.  Will repeat high-res CT.  We will also obtain spirometry with DLCO.  We can compare the studies to their 2017 studies respectively.  We will give patient sample of Breo Ellipta 200 for patient to trial over the next 2 weeks.  Patient to contact our office if she feels this is helped her breathing.  If so we can place order for Breo Ellipta 200 instead of her Breo Ellipta 100.  If patient notices no change she can resume her Breo Ellipta 100.  Bring patient back in 2 to 4 weeks to be seen office visit we will go through these results with her.  Patient to receive Pneumovax 23 at next office visit as well as flu vaccine.  Dyspnea Please complete high-res CT prior to next appointment  Spirometry with DLCO prior to next appointment  Follow-up with our office in 2 to 4 weeks  We will do Breo Ellipta 200 sample:  Breo Ellipta 200 >>> Take 1 puff daily in the morning right when you wake up >>>Rinse your mouth out after use >>>This is a daily maintenance inhaler, NOT a rescue inhaler >>>Contact our office if you are having difficulties affording or obtaining this medication >>>It is important for you to be able to take this daily and not miss any doses   >>>This replaces your Breo 100, after  using the Breo 200 contact our office if you feel this is helped her breathing then we can place an order for the [place] maintenance Patient received flu vaccine and Pneumovax 23 vaccine at next office visit Patient received Tdap in DEC/ 2020  This appointment was 28 minutes along with her 50% of the time direct face-to-face patient care, assessment, plan of care discussion.   Lauraine Rinne, NP 04/01/2018

## 2018-04-01 NOTE — Patient Instructions (Signed)
Please complete high-res CT prior to next appointment  Spirometry with DLCO prior to next appointment  Follow-up with our office in 2 to 4 weeks  We will do Breo Ellipta 200 sample:  Breo Ellipta 200 >>> Take 1 puff daily in the morning right when you wake up >>>Rinse your mouth out after use >>>This is a daily maintenance inhaler, NOT a rescue inhaler >>>Contact our office if you are having difficulties affording or obtaining this medication >>>It is important for you to be able to take this daily and not miss any doses   >>>This replaces your Breo 100, after using the Breo 200 contact our office if you feel this is helped her breathing then we can place an order for the Breo 200    Please contact the office if your symptoms worsen or you have concerns that you are not improving.   Thank you for choosing  [facility] for your healthcare, and for allowing [name] to partner with you on your healthcare journey. I am thankful to be able to provide care to you today.   [name] FNP-C

## 2018-04-01 NOTE — Progress Notes (Signed)
Reviewed and agree with assessment/plan.   Cheralyn Oliver, MD [facility] 08/01/2016, 12:24 PM Pager:  [phone]  

## 2018-04-01 NOTE — Assessment & Plan Note (Signed)
Please complete high-res CT prior to next appointment  Spirometry with DLCO prior to next appointment  Follow-up with our office in 2 to 4 weeks  We will do Breo Ellipta 200 sample:  Breo Ellipta 200 >>> Take 1 puff daily in the morning right when you wake up >>>Rinse your mouth out after use >>>This is a daily maintenance inhaler, NOT a rescue inhaler >>>Contact our office if you are having difficulties affording or obtaining this medication >>>It is important for you to be able to take this daily and not miss any doses   >>>This replaces your Breo 100, after using the Breo 200 contact our office if you feel this is helped her breathing then we can place an order for the Breo 200

## 2018-04-01 NOTE — Telephone Encounter (Signed)
Left message for patient. Patient needs to come in a for an appt. Will call back.

## 2018-04-01 NOTE — Assessment & Plan Note (Signed)
Patient received flu vaccine and Pneumovax 23 vaccine at next office visit Patient received Tdap in DEC/ 2020

## 2018-04-02 NOTE — Telephone Encounter (Signed)
Left message for patient to call back  

## 2018-04-03 ENCOUNTER — Ambulatory Visit
Admission: RE | Admit: 2018-04-03 | Discharge: 2018-04-03 | Disposition: A | Discharge status: Home / Self Care | Payer: Federal, State, Local not specified - PPO | Source: Ambulatory Visit | Attending: Internal Medicine | Admitting: Internal Medicine

## 2018-04-03 ENCOUNTER — Other Ambulatory Visit: Payer: Self-pay

## 2018-04-03 ENCOUNTER — Encounter: Payer: Self-pay | Admitting: Internal Medicine

## 2018-04-03 DIAGNOSIS — R946 Abnormal results of thyroid function studies: Secondary | ICD-10-CM | POA: Diagnosis not present

## 2018-04-03 DIAGNOSIS — E559 Vitamin D deficiency, unspecified: Secondary | ICD-10-CM | POA: Diagnosis not present

## 2018-04-03 DIAGNOSIS — R7989 Other specified abnormal findings of blood chemistry: Secondary | ICD-10-CM

## 2018-04-03 DIAGNOSIS — R945 Abnormal results of liver function studies: Secondary | ICD-10-CM | POA: Diagnosis not present

## 2018-04-03 NOTE — Telephone Encounter (Signed)
Pt had an appointment with Aaron Edelman on 04/01/18 at 3:30pm. Message will be closed.

## 2018-04-04 ENCOUNTER — Telehealth: Payer: Self-pay | Admitting: *Deleted

## 2018-04-04 LAB — TEST AUTHORIZATION

## 2018-04-04 LAB — HEPATIC FUNCTION PANEL
AG Ratio: 1.1 (calc) (ref 1.0–2.5)
ALBUMIN MSPROF: 4.2 g/dL (ref 3.6–5.1)
ALKALINE PHOSPHATASE (APISO): 92 U/L (ref 33–130)
ALT: 54 U/L — AB (ref 6–29)
AST: 38 U/L — AB (ref 10–35)
BILIRUBIN DIRECT: 0.1 mg/dL (ref 0.0–0.2)
BILIRUBIN TOTAL: 0.4 mg/dL (ref 0.2–1.2)
Globulin: 3.7 g/dL (calc) (ref 1.9–3.7)
Indirect Bilirubin: 0.3 mg/dL (calc) (ref 0.2–1.2)
Total Protein: 7.9 g/dL (ref 6.1–8.1)

## 2018-04-04 LAB — VITAMIN D 25 HYDROXY (VIT D DEFICIENCY, FRACTURES): Vit D, 25-Hydroxy: 30 ng/mL (ref 30–100)

## 2018-04-04 MED ORDER — METHOTREXATE 2.5 MG PO TABS: 15.0000 mg | ORAL_TABLET | ORAL | 0 refills | Status: DC

## 2018-04-04 MED ORDER — VITAMIN D (ERGOCALCIFEROL) 1.25 MG (50000 UNIT) PO CAPS: 50000.0000 [IU] | ORAL_CAPSULE | ORAL | 1 refills | Status: DC

## 2018-04-04 NOTE — Progress Notes (Signed)
Continue Vit D 50,000 q month

## 2018-04-04 NOTE — Telephone Encounter (Signed)
-----   Message from Bo Merino, MD sent at 04/04/2018  8:23 AM EDT ----- Continue Vit D 50,000 q month

## 2018-04-04 NOTE — Progress Notes (Signed)
Patient was clinically doing well on methotrexate to 8 tablets/week.  Her LFTs are still mildly elevated.  Please have her reduce methotrexate to 6 tablets p.o. weekly.

## 2018-04-04 NOTE — Telephone Encounter (Signed)
Patient states she was seen by a pulmonologist . Patient states she is scheduled CT Scan on 04/15/18 and see will follow up on 04/17/18.

## 2018-04-04 NOTE — Telephone Encounter (Signed)
-----   Message from Bo Merino, MD sent at 04/04/2018  2:39 PM EDT ----- Patient was clinically doing well on methotrexate to 8 tablets/week.  Her LFTs are still mildly elevated.  Please have her reduce methotrexate to 6 tablets p.o. weekly.

## 2018-04-10 ENCOUNTER — Other Ambulatory Visit: Payer: Self-pay | Admitting: Gastroenterology

## 2018-04-10 DIAGNOSIS — R1011 Right upper quadrant pain: Secondary | ICD-10-CM | POA: Diagnosis not present

## 2018-04-10 DIAGNOSIS — R1310 Dysphagia, unspecified: Secondary | ICD-10-CM

## 2018-04-10 DIAGNOSIS — R112 Nausea with vomiting, unspecified: Secondary | ICD-10-CM | POA: Diagnosis not present

## 2018-04-10 DIAGNOSIS — R194 Change in bowel habit: Secondary | ICD-10-CM | POA: Diagnosis not present

## 2018-04-15 ENCOUNTER — Ambulatory Visit (INDEPENDENT_AMBULATORY_CARE_PROVIDER_SITE_OTHER)
Admission: RE | Admit: 2018-04-15 | Discharge: 2018-04-15 | Disposition: A | Discharge status: Home / Self Care | Payer: Federal, State, Local not specified - PPO | Source: Ambulatory Visit | Attending: Pulmonary Disease | Admitting: Pulmonary Disease

## 2018-04-15 DIAGNOSIS — J479 Bronchiectasis, uncomplicated: Secondary | ICD-10-CM | POA: Diagnosis not present

## 2018-04-15 DIAGNOSIS — R0600 Dyspnea, unspecified: Secondary | ICD-10-CM

## 2018-04-15 DIAGNOSIS — R0609 Other forms of dyspnea: Secondary | ICD-10-CM

## 2018-04-15 DIAGNOSIS — J471 Bronchiectasis with (acute) exacerbation: Secondary | ICD-10-CM | POA: Diagnosis not present

## 2018-04-17 ENCOUNTER — Ambulatory Visit: Payer: Federal, State, Local not specified - PPO | Admitting: Pulmonary Disease

## 2018-04-17 ENCOUNTER — Ambulatory Visit (INDEPENDENT_AMBULATORY_CARE_PROVIDER_SITE_OTHER): Payer: Federal, State, Local not specified - PPO | Admitting: Pulmonary Disease

## 2018-04-17 ENCOUNTER — Encounter: Payer: Self-pay | Admitting: Pulmonary Disease

## 2018-04-17 DIAGNOSIS — J479 Bronchiectasis, uncomplicated: Secondary | ICD-10-CM

## 2018-04-17 DIAGNOSIS — J471 Bronchiectasis with (acute) exacerbation: Secondary | ICD-10-CM

## 2018-04-17 DIAGNOSIS — R05 Cough: Secondary | ICD-10-CM | POA: Diagnosis not present

## 2018-04-17 DIAGNOSIS — Z0000 Encounter for general adult medical examination without abnormal findings: Secondary | ICD-10-CM | POA: Diagnosis not present

## 2018-04-17 DIAGNOSIS — R0600 Dyspnea, unspecified: Secondary | ICD-10-CM

## 2018-04-17 DIAGNOSIS — M069 Rheumatoid arthritis, unspecified: Secondary | ICD-10-CM | POA: Diagnosis not present

## 2018-04-17 DIAGNOSIS — R0609 Other forms of dyspnea: Secondary | ICD-10-CM

## 2018-04-17 DIAGNOSIS — J849 Interstitial pulmonary disease, unspecified: Secondary | ICD-10-CM | POA: Insufficient documentation

## 2018-04-17 DIAGNOSIS — R059 Cough, unspecified: Secondary | ICD-10-CM

## 2018-04-17 LAB — PULMONARY FUNCTION TEST
DL/VA % PRED: 98 %
DL/VA: 4.91 ml/min/mmHg/L
DLCO UNC % PRED: 76 %
DLCO cor % pred: 75 %
DLCO cor: 20.01 ml/min/mmHg
DLCO unc: 20.26 ml/min/mmHg
FEF 25-75 Pre: 3.9 L/sec
FEF2575-%Pred-Pre: 157 %
FEV1-%PRED-PRE: 101 %
FEV1-PRE: 2.46 L
FEV1FVC-%PRED-PRE: 110 %
FEV6-%Pred-Pre: 92 %
FEV6-Pre: 2.74 L
FEV6FVC-%PRED-PRE: 103 %
FVC-%Pred-Pre: 90 %
FVC-PRE: 2.74 L
Pre FEV1/FVC ratio: 90 %
Pre FEV6/FVC Ratio: 100 %

## 2018-04-17 MED ORDER — AMOXICILLIN-POT CLAVULANATE 875-125 MG PO TABS: 1.0000 | ORAL_TABLET | Freq: Two times a day (BID) | ORAL | 0 refills | Status: DC

## 2018-04-17 MED ORDER — FLUTICASONE FUROATE-VILANTEROL 100-25 MCG/INH IN AEPB: 1.0000 | INHALATION_SPRAY | Freq: Every day | RESPIRATORY_TRACT | 0 refills | Status: DC

## 2018-04-17 MED ORDER — PREDNISONE 10 MG PO TABS: ORAL_TABLET | ORAL | 0 refills | Status: DC

## 2018-04-17 NOTE — Patient Instructions (Addendum)
Augmentin >>> Take 1 875-125 mg tablet every 12 hours for the next 7 days >>> Take with food  Prednisone 10mg  tablet  >>>4 tabs for 2 days, then 3 tabs for 2 days, 2 tabs for 2 days, then 1 tab for 2 days, then stop >>>take with food  >>>take in the morning   Breo Ellipta 100 >>> Take 1 puff daily in the morning right when you wake up >>>Rinse your mouth out after use >>>This is a daily maintenance inhaler, NOT a rescue inhaler >>>Contact our office if you are having difficulties affording or obtaining this medication >>>It is important for you to be able to take this daily and not miss any doses   Follow-up with [name] the next 4 to 6 weeks sooner if symptoms are not improving  Hold flu and pneumonia vaccines at this time   Bronchiectasis: This is the medical term which indicates that you have damage, dilated airways making you more susceptible to respiratory infection. Use a flutter valve 10 breaths twice a day or 4 to 5 breaths 4-5 times a day to help clear mucus out Let us know if you have cough with change in mucus color or fevers or chills.  At that point you would need an antibiotic. Maintain a healthy nutritious diet, eating whole foods Take your medications as prescribed     It is flu season:   >>>Remember to be washing your hands regularly, using hand sanitizer, be careful to use around herself with has contact with people who are sick will increase her chances of getting sick yourself. >>> Best ways to protect herself from the flu: Receive the yearly flu vaccine, practice good hand hygiene washing with soap and also using hand sanitizer when available, eat a nutritious meals, get adequate rest, hydrate appropriately   Please contact the office if your symptoms worsen or you have concerns that you are not improving.   Thank you for choosing [facility] for your healthcare, and for allowing [name] to partner with you on your healthcare journey. I am thankful to  be able to provide care to you today.   [name] FNP-C     Bronchiectasis Bronchiectasis is a condition in which the airways (bronchi) are damaged and widened. This makes it difficult for the lungs to get rid of mucus. As a result, mucus gathers in the airways, and this often leads to lung infections. Infection can cause inflammation in the airways, which may further weaken and damage the bronchi. What are the causes? Bronchiectasis may be present at birth (congenital) or may develop later in life. Sometimes there is no apparent cause. Some common causes include:  Cystic fibrosis.  Recurrent lung infections (such as pneumonia, tuberculosis, or fungal infections).  Foreign bodies or other blockages in the lungs.  Breathing in fluid, food, or other foreign objects (aspiration).  What are the signs or symptoms? Common symptoms include:  A daily cough that brings up mucus and lasts for more than 3 weeks.  Frequent lung infections (such as pneumonia, tuberculosis, or fungal infections).  Shortness of breath and wheezing.  Weakness and fatigue.  How is this diagnosed? Various tests may be done to help diagnose bronchiectasis. Tests may include:  Chest X-rays or CT scans.  Breathing tests to help determine how your lungs are working.  Sputum cultures to check for infection.  Blood tests and other tests to check for related diseases or causes, such as cystic fibrosis.  How is this treated?  Treatment varies depending on the severity of the condition. Medicines may be given to loosen the mucus to be coughed up (expectorants), to relax the muscles of the air passages (bronchodilators), or to prevent or treat infections (antibiotics). Physical therapy methods may be recommended to help clear mucus from the lungs. For severe cases, surgery may be done to remove the affected part of the lung. Follow these instructions at home:  Get plenty of rest.  Only take over-the-counter or  prescription medicines as directed by your health care provider. If antibiotic medicines were prescribed, take them as directed. Finish them even if you start to feel better.  Avoid sedatives and antihistamines unless otherwise directed by your health care provider. These medicines tend to thicken the mucus in the lungs.  Perform any breathing exercises or techniques to clear the lungs as directed by your health care provider.  Drink enough fluids to keep your urine clear or pale yellow.  Consider using a cold steam vaporizer or humidifier in your room or home to help loosen secretions.  If the cough is worse at night, try sleeping in a semi-upright position in a recliner or using a couple of pillows.  Avoid cigarette smoke and lung irritants. If you smoke, quit.  Stay inside when pollution and ozone levels are high.  Stay current with vaccinations and immunizations.  Follow up with your health care provider as directed. Contact a health care provider if:  You cough up more thick, discolored mucus (sputum) that is yellow to green in color.  You have a fever or persistent symptoms for more than 2-3 days.  You cannot control your cough and are losing sleep. Get help right away if:  You cough up blood.  You have chest pain or increasing shortness of breath.  You have pain that is getting worse or is uncontrolled with medicines.  You have a fever and your symptoms suddenly get worse. This information is not intended to replace advice given to you by your health care provider. Make sure you discuss any questions you have with your health care provider. Document Released: 05/20/2007 Document Revised: 01/04/2016 Document Reviewed: 01/28/2013 Elsevier Interactive Patient Education  2017 [employer].

## 2018-04-17 NOTE — Assessment & Plan Note (Signed)
We will treat today as bronchiectatic flare  Augmentin >>> Take 1 875-125 mg tablet every 12 hours for the next 7 days >>> Take with food  Prednisone 10mg  tablet  >>>4 tabs for 2 days, then 3 tabs for 2 days, 2 tabs for 2 days, then 1 tab for 2 days, then stop >>>take with food  >>>take in the morning   Breo Ellipta 100 >>> Take 1 puff daily in the morning right when you wake up >>>Rinse your mouth out after use >>>This is a daily maintenance inhaler, NOT a rescue inhaler >>>Contact our office if you are having difficulties affording or obtaining this medication >>>It is important for you to be able to take this daily and not miss any doses   Follow-up with [name] the next 4 to 6 weeks sooner if symptoms are not improving  Hold flu and pneumonia vaccines at this time  Bronchiectasis: This is the medical term which indicates that you have damage, dilated airways making you more susceptible to respiratory infection. Use a flutter valve 10 breaths twice a day or 4 to 5 breaths 4-5 times a day to help clear mucus out Let us know if you have cough with change in mucus color or fevers or chills.  At that point you would need an antibiotic. Maintain a healthy nutritious diet, eating whole foods Take your medications as prescribed

## 2018-04-17 NOTE — Progress Notes (Signed)
Discussed results with patient in office.  Nothing further is needed at this time.  Koda Defrank FNP  

## 2018-04-17 NOTE — Assessment & Plan Note (Signed)
Follow-up with rheumatology in regards to progression of fibrosis on CT scans Discussed methotrexate with rheumatology Follow-up with [name] in 4 to 6 weeks

## 2018-04-17 NOTE — Progress Notes (Signed)
Discussed results with patient in office.  Discussed with patient patient needs to follow-up with rheumatologist to discuss findings on CT chest.  Routed note from office visit today to rheumatology.  Keep follow-up with our office in 4 to 6 weeks.  Continue forward with plan of care of Augmentin as well as prednisone taper.  Follow-up with our office if you have any worsening shortness of breath, cough, concerns regarding your breathing.  [name] FNP

## 2018-04-17 NOTE — Assessment & Plan Note (Signed)
Hold flu and Pneumovax 23 vaccines today

## 2018-04-17 NOTE — Assessment & Plan Note (Signed)
We will have patient follow-up with rheumatologist to discuss progression on CT high-res   Patient also to discuss with rheumatologist methotrexate dose  Discuss with rheumatologist if methotrexate is the best chronic management for RA

## 2018-04-17 NOTE — Assessment & Plan Note (Signed)
We will treat his bronchiectatic flare today Prednisone taper Augmentin for 7 days Follow-up with our office in 4 to 6 weeks

## 2018-04-17 NOTE — Progress Notes (Signed)
@Patient  ID: Jill Harper, female    DOB: [birth year], 52 y.o.   MRN: [id number]  Chief Complaint  Patient presents with  . Follow-up    PFT today    Referring provider: Binnie Rail, MD  HPI:  52 year old female followed in our office for bronchiectasis in the setting of rheumatoid arthritis and polymyositis  PMH: Rheumatoid arthritis, polymyositis, maintained on methotrexate per rheumatology Smoker/ Smoking History: Never smoker Maintenance: Breo Ellipta 200 Pt of: [name]  Recent [facility] Encounters:   04/01/2018  - Visit - BM 52 year old patient seen for follow-up visit today.  Patient was noted to have crackles by 2 of her specialist.  Patient wanted to present here to be evaluated. Plan: High-res CT, spirometry with DLCO, increase Breo Ellipta to 200 provided sample, flu vaccine and pneumonia vaccine at next office visit  04/17/2018  - Visit   52 year old patient presenting today for follow-up visit.  Patient completed spirometry with DLCO today prior to office visit.  Patient also has completed a high-res CT.  Patient would like to discuss those results.  At last office visit patient was increased to [place] 200 patient did not notice any response or improvement in her breathing.  Patient is still experiencing increased cough, shortness of breath.  Patient does not feel like she is able to adequately clear secretions as well she had in the past.  Patient reports "this is not like my normal bronchiectasis cough, I typically cough in the morning clear mucus and then can go about my day".  Patient has been having increased cough throughout the day.  Patient reports that it is clear to thick white mucus.  But increased mucus production over these past couple weeks.  Patient reports adherence to flutter valve.  Patient endorses regular follow-up with rheumatology.  Patient reports that due to increased liver function testing she has had to decrease her methotrexate  under instruction of her rheumatologist.   Tests:   Chest imaging: CT chest 03/23/05 >> patchy b/l lower lung ASD with cylindrical BTX CT chest 02/17/08 >> peripheral and basilar predominant subpleural GGO CT chest 12/01/08 >> no change HRCT chest 09/07/15 >> scattered GGO 04/15/2018-CT chest high-res- spectrum of findings compatible with basilar predominant fibrotic interstitial lung disease with progression since 2017,  UIP  Pulmonarytests: PFT 10/30/11 >> FEV1 3.31 (114%), FEV1% 85, TLC 6.06 (108%), DLCO 77%, no BD PFT 11/18/12 >> FEV1 3.18 (111%), FEV1% 84, TLC 5.34 (95%), DLCO 67%, no BD PFT 12/02/15 >> FEV1 2.96 (108%), FEV1% 92, TLC 4.95 (86%), DLCO 65%, no BD 04/17/2018-spirometry with DLCO-DLCO 76%  Serology 01/31/17 >> ANA negative, RF 29, anti CCP 213, SCL 70 negative, SSA/SSB negative, ENA SM negative, Jo-1 positive Quantiferon gold 01/31/17 >> negative  Cardiac tests: Echo 05/29/17 >> EF 60 to 65%, mod LVH, grade 1 DD   Chart Review:     Specialty Problems      Pulmonary Problems   BRONCHIECTASIS    Dr Halford Chessman  CT sinus 09/07/2015 neg for acute dz.  HRCT 09/07/2015 >Basilar predominant peribronchovascular ground-glass is indicative of bronchopneumonia. . No definitive evidence of underlying interstitial lung disease.            Throat clearing   Cough   Wheezing   Bronchiectasis (HCC)   Dyspnea    PFT 12/02/15 >> FEV1 2.96 (108%), FEV1% 92, TLC 4.95 (86%), DLCO 65%, no BD  HRCT chest 09/07/15 >> scattered GGO      ILD (interstitial lung  disease) ([place])    HRCT chest 09/07/15 >> scattered GGO 04/15/2018-CT chest high-res- spectrum of findings compatible with basilar predominant fibrotic interstitial lung disease with progression since 2017,  UIP  Pulmonarytests: PFT 10/30/11 >> FEV1 3.31 (114%), FEV1% 85, TLC 6.06 (108%), DLCO 77%, no BD PFT 11/18/12 >> FEV1 3.18 (111%), FEV1% 84, TLC 5.34 (95%), DLCO 67%, no BD PFT 12/02/15 >> FEV1 2.96 (108%), FEV1% 92, TLC 4.95  (86%), DLCO 65%, no BD 04/17/2018-spirometry with DLCO-DLCO 76%  Serology 01/31/17 >> ANA negative, RF 29, anti CCP 213, SCL 70 negative, SSA/SSB negative, ENA SM negative, Jo-1 positive Quantiferon gold 01/31/17 >> negative          Allergies  Allergen Reactions  . Sulfonamide Derivatives     REACTION: hives    Immunization History  Administered Date(s) Administered  . Influenza Split 04/10/2011, 06/18/2012, 05/06/2013  . Influenza Whole 05/19/2008, 05/15/2009, 06/01/2010  . Influenza,inj,Quad PF,6+ Mos 04/20/2014, 05/26/2015, 05/31/2016, 04/05/2017  . Pneumococcal Conjugate-13 01/29/2017  . Pneumococcal Polysaccharide-23 08/07/2007  . Td 07/12/2009   Patient needs flu vaccine as well as Pneumovax 23, will hold on this administration today  Past Medical History:  Diagnosis Date  . Abnormal liver function test   . Bronchiectasis       . Fibromyalgia   . Menorrhagia   . MIGRAINE HEADACHE   . MVP (mitral valve prolapse)   . Polymyositis ([place])    Dr [name]; chronic MTX    Tobacco History: Social History   Tobacco Use  Smoking Status Never Smoker  Smokeless Tobacco Never Used  Tobacco Comment   Married, lives with spouse. 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 works at [name] in [employer] [place] [employer]  Continue to not smoke  Outpatient Encounter Medications as of 04/17/2018  Medication Sig  . albuterol (PROVENTIL HFA) 108 (90 Base) MCG/ACT inhaler Inhale 2 puffs into the lungs 4 (four) times daily as needed. Reported on 09/03/2015  . atenolol (TENORMIN) 25 MG tablet TAKE 1/2 TABLET BY MOUTH DAILY AS NEEDED FOR SYMPTOMS OF MITRAL PROLAPSE  . cyclobenzaprine (FLEXERIL) 10 MG tablet Take 5 mg by mouth at bedtime as needed. Reported on 09/03/2015  . diclofenac sodium (VOLTAREN) 1 % GEL Apply 2 g topically 4 (four) times daily as needed (pain).    [name] eletriptan (RELPAX) 20 MG tablet One tablet by mouth at onset of headache. May repeat in 2 hours if headache persists or recurs.  . fluticasone furoate-vilanterol (BREO ELLIPTA) 100-25 MCG/INH AEPB Inhale 1 puff into the lungs daily.  . folic acid (FOLVITE) 1 MG tablet Take 2 tablets (2 mg total) by mouth daily.  [name] levothyroxine (SYNTHROID, LEVOTHROID) 25 MCG tablet Take 1 tablet (25 mcg total) by mouth daily before breakfast.  . methotrexate (RHEUMATREX) 2.5 MG tablet Take 6 tablets (15 mg total) by mouth once a week. Caution:Chemotherapy. Protect from light.  [name] Respiratory Therapy Supplies (FLUTTER) DEVI Use as directed (Patient taking differently: 4 (four) times daily. Use as directed)  . traMADol (ULTRAM) 50 MG tablet Take 50 mg by mouth every 6 (six) hours as needed for moderate pain. Reported on 09/03/2015  . Vitamin D, Ergocalciferol, (DRISDOL) 50000 units CAPS capsule Take 1 capsule (50,000 Units total) by mouth every 30 (thirty) days.  . [DISCONTINUED] fluticasone furoate-vilanterol (BREO ELLIPTA) 200-25 MCG/INH AEPB Inhale 1 puff into the lungs daily.  [name] amoxicillin-clavulanate (AUGMENTIN) 875-125  MG tablet Take 1 tablet by mouth 2 (two) times daily.  . fluticasone furoate-vilanterol (BREO ELLIPTA) 100-25 MCG/INH AEPB Inhale 1 puff into the lungs daily.  . predniSONE (DELTASONE) 10 MG tablet 4 tabs for 2 days, then 3 tabs for 2 days, 2 tabs for 2 days, then 1 tab for 2 days, then stop  . [DISCONTINUED] Vitamin D, Ergocalciferol, (DRISDOL) 50000 units CAPS capsule Take 1 capsule (50,000 Units total) by mouth every 14 (fourteen) days.   No facility-administered encounter medications on file as of 04/17/2018.      Review of Systems  Review of Systems  Constitutional: Positive for fatigue. Negative for chills, fever and unexpected weight change.  HENT: Negative for congestion, ear pain, postnasal drip, sinus pressure and sinus pain.   Respiratory: Positive for cough (Occasionally  productive, thick white mucus throughout day) and chest tightness (Occasional). Negative for shortness of breath and wheezing.   Cardiovascular: Negative for chest pain and palpitations.  Gastrointestinal: Negative for blood in stool, diarrhea, nausea and vomiting.       Denies indigestion  Genitourinary: Negative for dysuria, frequency and urgency.  Musculoskeletal: Negative for arthralgias.  Skin: Negative for color change.  Allergic/Immunologic: Negative for environmental allergies and food allergies.  Neurological: Negative for dizziness, light-headedness and headaches.  Psychiatric/Behavioral: Negative for dysphoric mood. The patient is not nervous/anxious.   All other systems reviewed and are negative.    Physical Exam  BP 130/78 (BP Location: Left Arm, Cuff Size: Normal)   Pulse 89   Ht 5' 5.7" (1.669 m)   Wt 275 lb 12.8 oz (125.1 kg)   SpO2 98%   BMI 44.92 kg/m   Wt Readings from Last 5 Encounters:  04/17/18 275 lb 12.8 oz (125.1 kg)  04/01/18 275 lb 12.8 oz (125.1 kg)  03/18/18 277 lb (125.6 kg)  02/18/18 272 lb (123.4 kg)  01/15/18 273 lb 3.2 oz (123.9 kg)    Physical Exam  Constitutional: She is oriented to person, place, and time and well-developed, well-nourished, and in no distress. No distress.  HENT:  Head: Normocephalic and atraumatic.  Right Ear: Hearing, tympanic membrane, external ear and ear canal normal.  Left Ear: Hearing, tympanic membrane, external ear and ear canal normal.  Nose: Nose normal. Right sinus exhibits no maxillary sinus tenderness and no frontal sinus tenderness. Left sinus exhibits no maxillary sinus tenderness and no frontal sinus tenderness.  Mouth/Throat: Uvula is midline and oropharynx is clear and moist. No oropharyngeal exudate.  Eyes: Pupils are equal, round, and reactive to light.  Neck: Normal range of motion. Neck supple. No JVD present.  Cardiovascular: Normal rate, regular rhythm and normal heart sounds.  Pulmonary/Chest:  Effort normal. No accessory muscle usage. No respiratory distress. She has no decreased breath sounds. She has no wheezes. She has no rhonchi. She has rales (in bases - Left > right).  Deep breath elicits cough  Abdominal: Soft. Bowel sounds are normal. There is no tenderness.  Musculoskeletal: Normal range of motion. She exhibits no edema.  Lymphadenopathy:    She has no cervical adenopathy.  Neurological: She is alert and oriented to person, place, and time. Gait normal.  Skin: Skin is warm and dry. She is not diaphoretic. No erythema.  Psychiatric: Mood, memory, affect and judgment normal.  Nursing note and vitals reviewed.     Lab Results:  CBC    Component Value Date/Time   WBC 6.7 02/18/2018 1609   RBC 4.81 02/18/2018 1609   HGB 13.8 02/18/2018 1609  HCT 41.3 02/18/2018 1609   PLT 304 02/18/2018 1609   MCV 85.9 02/18/2018 1609   MCH 28.7 02/18/2018 1609   MCHC 33.4 02/18/2018 1609   RDW 13.7 02/18/2018 1609   LYMPHSABS 1,802 02/18/2018 1609   MONOABS 666 01/31/2017 1006   EOSABS 161 02/18/2018 1609   BASOSABS 27 02/18/2018 1609    BMET    Component Value Date/Time   NA 138 02/18/2018 1609   K 4.5 02/18/2018 1609   CL 105 02/18/2018 1609   CO2 27 02/18/2018 1609   GLUCOSE 85 02/18/2018 1609   BUN 11 02/18/2018 1609   CREATININE 0.75 02/18/2018 1609   CALCIUM 9.5 02/18/2018 1609   GFRNONAA 92 02/18/2018 1609   GFRAA 106 02/18/2018 1609    BNP No results found for: BNP  ProBNP    Component Value Date/Time   PROBNP 58.0 04/18/2009 1157    Imaging: Ct Chest High Resolution  Result Date: 04/15/2018 CLINICAL DATA:  Follow-up bronchiectasis. Dyspnea on exertion. Worsening cough. Rheumatoid arthritis on methotrexate. EXAM: CT CHEST WITHOUT CONTRAST TECHNIQUE: Multidetector CT imaging of the chest was performed following the standard protocol without intravenous contrast. High resolution imaging of the lungs, as well as inspiratory and expiratory imaging, was  performed. COMPARISON:  12/11/2017 chest radiograph. 09/07/2015 high-resolution chest CT. FINDINGS: Cardiovascular: Normal heart size. No significant pericardial effusion/thickening. Great vessels are normal in course and caliber. Mediastinum/Nodes: No discrete thyroid nodules. Unremarkable esophagus. No pathologically enlarged axillary, mediastinal or hilar lymph nodes, noting limited sensitivity for the detection of hilar adenopathy on this noncontrast study. Lungs/Pleura: No pneumothorax. No pleural effusion. There is patchy confluent subpleural and peripheral peribronchovascular reticulation and ground-glass opacity throughout both lungs with a strong basilar gradient. There is associated mild traction bronchiectasis and mild architectural distortion. These findings have progressed since 09/07/2015 high-resolution chest CT. No frank honeycombing. No acute consolidative airspace disease, lung masses or significant pulmonary nodules. No significant air trapping on the expiration sequence. Upper abdomen: No acute abnormality. Musculoskeletal: No aggressive appearing focal osseous lesions. Mild thoracic spondylosis. IMPRESSION: Spectrum of findings compatible with basilar predominant fibrotic interstitial lung disease with progression since 2017 high-resolution chest CT study. Findings are considered typical of usual interstitial pneumonia (UIP) pattern due to connective tissue disease. Electronically Signed   By: Ilona Sorrel M.D.   On: 04/15/2018 16:55   US Thyroid  Result Date: 04/03/2018 CLINICAL DATA:  Elevated TSH EXAM: THYROID ULTRASOUND TECHNIQUE: Ultrasound examination of the thyroid gland and adjacent soft tissues was performed. COMPARISON:  None. FINDINGS: Parenchymal Echotexture: Normal Isthmus: 2 mm Right lobe: 3.4 x 0.9 x 1.2 cm Left lobe: 2.7 x 0.8 x 1 cm _________________________________________________________ Estimated total number of nodules >/= 1 cm: 0 Number of spongiform nodules >/=  2 cm  not described below (TR1): 0 Number of mixed cystic and solid nodules >/= 1.5 cm not described below (TR2): 0 _________________________________________________________ Incidental tiny subcentimeter hypoechoic and cystic nodules noted bilaterally, all measuring 3 mm or less in size. No significant thyroid nodule or focal abnormality. No adenopathy. Normal vascularity. IMPRESSION: No significant thyroid abnormality by ultrasound. The above is in keeping with the ACR TI-RADS recommendations - J Am Coll Radiol 2017;14:587-595. Electronically Signed   By: Jerilynn Mages.  Shick M.D.   On: 04/03/2018 16:54      Assessment & Plan:   Pleasant 52 year old female patient presenting today for follow-up.  Discussed CT and spirometry with DLCO results with patient.  Explained to patient she needs follow-up with rheumatology to ensure that  methotrexate is adequately controlling her RA with the progression that were seen on her CT scans.  Can also discuss with rheumatology if methotrexate is the best option for chronic management for her RA at this time.  I explained to the patient that if DLCO is actually improved from 2017.  Which is reassuring.  For patient's acute symptoms we will treat his bronchiectatic flare with Augmentin and prednisone taper.  Follow-up with [name] in 4 to 6 weeks.  Could consider flu vaccine as well as Pneumovax 23 at that time.  Bronchiectasis ([place]) We will treat today as bronchiectatic flare  Augmentin >>> Take 1 875-125 mg tablet every 12 hours for the next 7 days >>> Take with food  Prednisone 10mg  tablet  >>>4 tabs for 2 days, then 3 tabs for 2 days, 2 tabs for 2 days, then 1 tab for 2 days, then stop >>>take with food  >>>take in the morning   Breo Ellipta 100 >>> Take 1 puff daily in the morning right when you wake up >>>Rinse your mouth out after use >>>This is a daily maintenance inhaler, NOT a rescue inhaler >>>Contact our office if you are having difficulties affording or  obtaining this medication >>>It is important for you to be able to take this daily and not miss any doses   Follow-up with [name] the next 4 to 6 weeks sooner if symptoms are not improving  Hold flu and pneumonia vaccines at this time  Bronchiectasis: This is the medical term which indicates that you have damage, dilated airways making you more susceptible to respiratory infection. Use a flutter valve 10 breaths twice a day or 4 to 5 breaths 4-5 times a day to help clear mucus out Let us know if you have cough with change in mucus color or fevers or chills.  At that point you would need an antibiotic. Maintain a healthy nutritious diet, eating whole foods Take your medications as prescribed    Healthcare maintenance Hold flu and Pneumovax 23 vaccines today  Cough We will treat his bronchiectatic flare today Prednisone taper Augmentin for 7 days Follow-up with our office in 4 to 6 weeks  Rheumatoid arthritis ([place]) We will have patient follow-up with rheumatologist to discuss progression on CT high-res   Patient also to discuss with rheumatologist methotrexate dose  Discuss with rheumatologist if methotrexate is the best chronic management for RA  ILD (interstitial lung disease) ([place]) Follow-up with rheumatology in regards to progression of fibrosis on CT scans Discussed methotrexate with rheumatology Follow-up with [name] in 4 to 6 weeks      Lauraine Rinne, NP 04/17/2018

## 2018-04-17 NOTE — Progress Notes (Signed)
PFT completed today. 04/17/18

## 2018-04-18 ENCOUNTER — Other Ambulatory Visit: Payer: Self-pay

## 2018-04-18 ENCOUNTER — Telehealth: Payer: Self-pay | Admitting: Rheumatology

## 2018-04-18 DIAGNOSIS — M0579 Rheumatoid arthritis with rheumatoid factor of multiple sites without organ or systems involvement: Secondary | ICD-10-CM

## 2018-04-18 NOTE — Addendum Note (Signed)
Addended by: Carole Binning on: 04/18/2018 02:40 PM   Modules accepted: Orders

## 2018-04-18 NOTE — Telephone Encounter (Signed)
Patient has been on methotrexate by Dr. Charlestine Night for several years.  I notice that she has had trouble with recurrent bronchiectasis.  Her ILD appears to be worsening per pulmonary.  We can switch her to Imuran.  Please have patient get T PMT and schedule an appointment to discuss treatment options.

## 2018-04-18 NOTE — Progress Notes (Signed)
Office Visit Note  Patient: Jill Harper             Date of Birth: Jun 05, 1966           MRN: [id number]             PCP: Binnie Rail, MD Referring: Binnie Rail, MD Visit Date: 04/24/2018 Occupation: _0 @  Subjective:  Pain in multiple joints   History of Present Illness: Jill Harper is a 52 y.o. female with history of seropositive rheumatoid arthritis, polymyositis, osteoarthritis, and fibromyalgia.  She cannot take MTX due to worsening ILD. Her last dose was 2 weeks ago.  She is here today to discuss starting on Imuran. She is currently on Augmentin (7 day course) for treatment of bronchiectatic flare.  She has 2 more days of antibiotics.  She has not noticed any improvement in her cough.  She reports that since missing her last dose of methotrexate she has been having increased pain in both hands and both feet.  She says she is also having stiffness in her hands and feet.  She denies any joint swelling at this time.  She reports that her fibromyalgia has been well controlled.  She denies any muscle aches or muscle tenderness at this time.  She states that she has been having increased muscle weakness and worsening muscle fatigue.  She denies any difficulty getting up from a chair raising her arms above her head. She states that she will be following up with Dr. Collene Mares this afternoon.  Over the past 1 year she has had vomiting off and on.  No trigger has been identified.  She states that the vomiting is very forceful.  She states last time she vomited was 2 days ago.  She states that she had a barium swallow which was normal as well as an abdominal ultrasound.    Activities of Daily Living:  Patient reports morning stiffness for 3-4 minutes.   Patient Reports nocturnal pain.  Difficulty dressing/grooming: Denies Difficulty climbing stairs: Denies Difficulty getting out of chair: Denies Difficulty using hands for taps, buttons, cutlery, and/or writing: Denies  Review of Systems    Constitutional: Positive for fatigue.  HENT: Negative for mouth sores, trouble swallowing, trouble swallowing, mouth dryness and nose dryness.   Eyes: Positive for pain and dryness. Negative for visual disturbance.       Right eye   Respiratory: Positive for cough. Negative for hemoptysis, shortness of breath, wheezing and difficulty breathing.   Cardiovascular: Negative for chest pain, palpitations, hypertension and swelling in legs/feet.  Gastrointestinal: Positive for abdominal pain. Negative for blood in stool, constipation, diarrhea and nausea.  Endocrine: Negative for increased urination.  Genitourinary: Negative for painful urination, nocturia and pelvic pain.  Musculoskeletal: Positive for arthralgias, joint pain, joint swelling and morning stiffness. Negative for myalgias, muscle weakness, muscle tenderness and myalgias.  Skin: Negative for color change, pallor, rash, hair loss, nodules/bumps, skin tightness, ulcers and sensitivity to sunlight.  Allergic/Immunologic: Negative for susceptible to infections.  Neurological: Negative for dizziness, light-headedness, numbness, headaches and memory loss.  Hematological: Negative for bruising/bleeding tendency and swollen glands.  Psychiatric/Behavioral: Negative for depressed mood, confusion and sleep disturbance. The patient is not nervous/anxious.     PMFS History:  Patient Active Problem List   Diagnosis Date Noted  . Rheumatoid arthritis ([place]) 04/17/2018  . ILD (interstitial lung disease) ([place]) 04/17/2018  . Dyspnea 04/01/2018  . Healthcare maintenance 04/01/2018  . Elevated TSH 03/18/2018  . Non-intractable vomiting  without nausea 03/18/2018  . Dysphagia 03/18/2018  . Fever 09/13/2017  . Bronchiectasis ([place]) 09/02/2017  . LVH (left ventricular hypertrophy) 06/02/2017  . Mitral regurgitation 05/22/2017  . Wheezing 04/05/2017  . Jo-1 antibody positive 01/29/2017  . Primary osteoarthritis of both feet 01/29/2017  . Primary  osteoarthritis of both hands 01/29/2017  . Other fatigue 01/29/2017  . Trapezoid ligament sprain 01/17/2017  . Left shoulder tendonitis 01/17/2017  . Vitamin D deficiency 04/26/2016  . High risk medication use 03/26/2016  . Frozen shoulder, right 03/26/2016  . Morbid obesity ([place]) 03/26/2016  . Cough 07/18/2015  . Neck mass 08/27/2013  . MVP (mitral valve prolapse)   . Erythema nodosum   . Throat clearing 05/07/2012  . INCONTINENCE, URGE 05/26/2010  . Migraine headache 02/15/2010  . Fibromyalgia 06/20/2009  . Polymyositis ([place]) 02/11/2008  . BRONCHIECTASIS 02/10/2008    Past Medical History:  Diagnosis Date  . Abnormal liver function test   . Bronchiectasis       . Fibromyalgia   . Menorrhagia   . MIGRAINE HEADACHE   . MVP (mitral valve prolapse)   . Polymyositis ([place])    Dr [name]; chronic MTX    Family History  Problem Relation Age of Onset  . Diabetes Mother   . Fibromyalgia Mother   . Ulcers Mother   . Multiple myeloma Father   . Lupus Sister   . Other Sister        abdominal adhesions resulting in bowel obstruction  . Heart disease Brother   . Rheum arthritis Sister   . Multiple myeloma Sister   . Hypertension Sister   . Heart attack Sister   . Diabetes Sister   . Hypertension Sister   . Rheum arthritis Sister   . Diabetes Sister   . Diabetes Brother    Past Surgical History:  Procedure Laterality Date  . CARDIAC CATHETERIZATION  2002   normal  . DILATION AND CURETTAGE OF UTERUS  08-31-08   Dr [name]   Social History   Social History Narrative   Exercise: trying to walk - limited by fatigue    Objective: Vital Signs: BP 118/78 (BP Location: Left Arm, Patient Position: Sitting, Cuff Size: Large)   Pulse 75   Resp 16   Ht 5' 7.5" (1.715 m)   Wt 278 lb 9.6 oz (126.4 kg)   BMI 42.99 kg/m    Physical Exam  Constitutional: She is oriented to person, place, and time. She appears well-developed and well-nourished.  HENT:  Head:  Normocephalic and atraumatic.  Eyes: Conjunctivae and EOM are normal.  Neck: Normal range of motion.  Cardiovascular: Normal rate, regular rhythm, normal heart sounds and intact distal pulses.  Pulmonary/Chest: Effort normal and breath sounds normal.  Abdominal: Soft. Bowel sounds are normal.  Lymphadenopathy:    She has no cervical adenopathy.  Neurological: She is alert and oriented to person, place, and time.  Skin: Skin is warm and dry. Capillary refill takes less than 2 seconds.  Psychiatric: She has a normal mood and affect. Her behavior is normal.  Nursing note and vitals reviewed.    Musculoskeletal Exam: C-spine, thoracic spine, lumbar spine good range of motion.  No midline spinal tenderness.  No SI joint tenderness.  Shoulder joints, elbow joints, wrist joints, MCPs, PIPs, DIPs good range of motion with no synovitis.  She has complete fist formation bilaterally.  Hip joints, knee joints, ankle joints, MTPs, PIPs, DIPs good range of motion with no synovitis.  No  warmth or effusion of bilateral knee joints.  She has bilateral pedal edema.  No tenderness of trochanteric bursa bilaterally.  CDAI Exam: CDAI Score: 0.8  Patient Global Assessment: 4 (mm); Provider Global Assessment: 4 (mm) Swollen: 0 ; Tender: 0  Joint Exam   Not documented   There is currently no information documented on the homunculus. Go to the Rheumatology activity and complete the homunculus joint exam.  Investigation: No additional findings.  Imaging: US Abdomen Complete  Result Date: 04/22/2018 CLINICAL DATA:  Right upper quadrant pain EXAM: ABDOMEN ULTRASOUND COMPLETE COMPARISON:  None. FINDINGS: Gallbladder: No gallstones or wall thickening visualized. No sonographic Murphy sign noted by sonographer. Common bile duct: Diameter: 2.5 mm Liver: No focal lesion identified. Within normal limits in parenchymal echogenicity. Portal vein is patent on color Doppler imaging with normal direction of blood flow  towards the liver. IVC: No abnormality visualized. Pancreas: Visualized portion unremarkable. Spleen: Size and appearance within normal limits. Right Kidney: Length: 10.2 cm. Echogenicity within normal limits. No mass or hydronephrosis visualized. Left Kidney: Length: 11 cm. Echogenicity within normal limits. 12 x 9 x 12 mm anechoic left renal mass most consistent with a cyst. No solid mass or hydronephrosis visualized. Abdominal aorta: No aneurysm visualized. Other findings: None. IMPRESSION: 1. No acute abdominal abnormality. 2. No cholelithiasis or sonographic evidence of acute cholecystitis. Electronically Signed   By: Kathreen Devoid   On: 04/22/2018 09:47   Dg Esophagus  Result Date: 04/21/2018 CLINICAL DATA:  Vomiting EXAM: ESOPHOGRAM / BARIUM SWALLOW / BARIUM TABLET STUDY TECHNIQUE: Combined double contrast and single contrast examination performed using effervescent crystals, thick barium liquid, and thin barium liquid. The patient was observed with fluoroscopy swallowing a 13 mm barium sulphate tablet. FLUOROSCOPY TIME:  Fluoroscopy Time:  1 minutes and 24 seconds Radiation Exposure Index (if provided by the fluoroscopic device): 140.5 mGy Number of Acquired Spot Images: COMPARISON:  None. FINDINGS: Frontal and lateral views of the hypopharynx while swallowing are normal. Double contrast imaging of the esophagus shows no stricture, diverticulum, mass lesion, or mucosal ulceration. Esophageal motility is normal. 13 mm barium tablet passes readily into the stomach when taken with water. IMPRESSION: Normal double contrast barium esophagram. Electronically Signed   By: Misty Stanley M.D.   On: 04/21/2018 11:09   Ct Chest High Resolution  Result Date: 04/15/2018 CLINICAL DATA:  Follow-up bronchiectasis. Dyspnea on exertion. Worsening cough. Rheumatoid arthritis on methotrexate. EXAM: CT CHEST WITHOUT CONTRAST TECHNIQUE: Multidetector CT imaging of the chest was performed following the standard protocol  without intravenous contrast. High resolution imaging of the lungs, as well as inspiratory and expiratory imaging, was performed. COMPARISON:  12/11/2017 chest radiograph. 09/07/2015 high-resolution chest CT. FINDINGS: Cardiovascular: Normal heart size. No significant pericardial effusion/thickening. Great vessels are normal in course and caliber. Mediastinum/Nodes: No discrete thyroid nodules. Unremarkable esophagus. No pathologically enlarged axillary, mediastinal or hilar lymph nodes, noting limited sensitivity for the detection of hilar adenopathy on this noncontrast study. Lungs/Pleura: No pneumothorax. No pleural effusion. There is patchy confluent subpleural and peripheral peribronchovascular reticulation and ground-glass opacity throughout both lungs with a strong basilar gradient. There is associated mild traction bronchiectasis and mild architectural distortion. These findings have progressed since 09/07/2015 high-resolution chest CT. No frank honeycombing. No acute consolidative airspace disease, lung masses or significant pulmonary nodules. No significant air trapping on the expiration sequence. Upper abdomen: No acute abnormality. Musculoskeletal: No aggressive appearing focal osseous lesions. Mild thoracic spondylosis. IMPRESSION: Spectrum of findings compatible with basilar predominant fibrotic interstitial lung  disease with progression since 2017 high-resolution chest CT study. Findings are considered typical of usual interstitial pneumonia (UIP) pattern due to connective tissue disease. Electronically Signed   By: Ilona Sorrel M.D.   On: 04/15/2018 16:55   Nm Hepato W/eject Fract  Result Date: 04/22/2018 CLINICAL DATA:  Right upper quadrant pain EXAM: NUCLEAR MEDICINE HEPATOBILIARY IMAGING WITH GALLBLADDER EF VIEWS: Anterior right upper quadrant RADIOPHARMACEUTICALS:  5.3 mCi Tc-68m Choletec IV COMPARISON:  None. FINDINGS: Liver uptake of radiotracer is unremarkable. There is prompt  visualization of gallbladder and small bowel, indicating patency of the cystic and common bile ducts. The patient consumed 8 ounces of Ensure orally with calculation of the computer generated ejection fraction of radiotracer from the gallbladder. Patient experienced pain with the oral Ensure consumption. The computer generated ejection fraction of radiotracer from the gallbladder is normal at 68%, normal greater than 33% using the oral agent. IMPRESSION: Normal ejection fraction of radiotracer from the gallbladder. The patient did experience pain with the oral Ensure consumption. Cystic and common bile ducts are patent as is evidenced by visualization of gallbladder and small bowel. Electronically Signed   By: WLowella GripIII M.D.   On: 04/22/2018 13:20   UKoreaThyroid  Result Date: 04/03/2018 CLINICAL DATA:  Elevated TSH EXAM: THYROID ULTRASOUND TECHNIQUE: Ultrasound examination of the thyroid gland and adjacent soft tissues was performed. COMPARISON:  None. FINDINGS: Parenchymal Echotexture: Normal Isthmus: 2 mm Right lobe: 3.4 x 0.9 x 1.2 cm Left lobe: 2.7 x 0.8 x 1 cm _________________________________________________________ Estimated total number of nodules >/= 1 cm: 0 Number of spongiform nodules >/=  2 cm not described below (TR1): 0 Number of mixed cystic and solid nodules >/= 1.5 cm not described below (TR2): 0 _________________________________________________________ Incidental tiny subcentimeter hypoechoic and cystic nodules noted bilaterally, all measuring 3 mm or less in size. No significant thyroid nodule or focal abnormality. No adenopathy. Normal vascularity. IMPRESSION: No significant thyroid abnormality by ultrasound. The above is in keeping with the ACR TI-RADS recommendations - J Am Coll Radiol 2017;14:587-595. Electronically Signed   By: MJerilynn Mages  Shick M.D.   On: 04/03/2018 16:54    Recent Labs: Lab Results  Component Value Date   WBC 6.7 02/18/2018   HGB 13.8 02/18/2018   PLT 304  02/18/2018   NA 138 02/18/2018   K 4.5 02/18/2018   CL 105 02/18/2018   CO2 27 02/18/2018   GLUCOSE 85 02/18/2018   BUN 11 02/18/2018   CREATININE 0.75 02/18/2018   BILITOT 0.4 04/03/2018   ALKPHOS 141 (H) 01/31/2017   AST 38 (H) 04/03/2018   ALT 54 (H) 04/03/2018   PROT 7.9 04/03/2018   ALBUMIN 3.6 01/31/2017   CALCIUM 9.5 02/18/2018   GFRAA 106 02/18/2018    Speciality Comments: No specialty comments available.  Procedures:  No procedures performed Allergies: Sulfonamide derivatives   Assessment / Plan:     Visit Diagnoses: Rheumatoid arthritis involving multiple sites with positive rheumatoid factor (HCC) - +RF, +CCP: She has no synovitis on exam today.  Her last dose of methotrexate was 2 weeks ago.  She is been having increased discomfort in bilateral hands and bilateral feet since she missed a dose of methotrexate.  Patient had a chest CT performed on 04/15/2018 that revealed further progression of ILD since 2017 comparison CT.  Her pulmonologist recommended switching from methotrexate to Imuran.  We discussed the indications, contraindications, and potential side effects of Imuran today.  All questions were addressed.  Consent was obtained.  T PMT was within normal limits.  She was advised to start taking Imuran when she completes the Augmentin she is taking currently.  Medication counseling:  TPMT: WNL  Patient was counseled on the purpose, proper use, and adverse effects of azathioprine including risk of infection, nausea, rash, and hair loss.  Reviewed risk of cancer after long term use.  Discussed risk of myelosupression and reviewed importance of frequent lab work to monitor blood counts.  Reviewed drug-drug interactions including contraindication with allopurinol.  Provided patient with educational materials on azathioprine and answered all questions.  Patient consented to azathioprine.  Will upload consent into the media tab.   Polymyositis ([place]) - Dx by Dr. Charlestine Night 2007,  CK 801, Jo-1 positive: She has 5 out of 5 muscle strength of upper and lower extremities.  She has no difficulty getting up from a chair raising her arms above her head.  She has been having worsening fatigue but does not appear to have any muscle weakness.  CK and sed rate were within normal limits on 02/18/2018.  Jo-1 antibody positive  High risk medication use  -She will be starting on Imuran.  Discontinue methotrexate due to further progression of ILD.  Primary osteoarthritis of both hands: She has mild PIP and DIP synovial thickening consistent with osteoarthritis of bilateral hands.  She is complete fist formation bilaterally.  She is having increased discomfort in bilateral hands.  Joint protection and muscle strengthening were discussed.  Primary osteoarthritis of both feet: She has mild osteoarthritic changes in bilateral feet.  She was proper fitting shoes.  She is been having increased discomfort in both feet.  Fibromyalgia: She has not had any recent fibromyalgia flares.  She has no muscle aches or muscle tenderness at this time.  She continues to have chronic fatigue.  Bronchiectasis without complication ([place]) - She is currently taking Augmentin (7-day course)  Other medical conditions are listed as follows:  Erythema nodosum  History of migraine  History of mitral valve prolapse  History of vitamin D deficiency  Other fatigue   Orders: No orders of the defined types were placed in this encounter.  No orders of the defined types were placed in this encounter.   Face-to-face time spent with patient was 30 minutes. Greater than 50% of time was spent in counseling and coordination of care.  Follow-Up Instructions: Return in about 5 months (around 09/24/2018) for Rheumatoid arthritis, Polymyositis, Fibromyalgia.   Ofilia Neas, PA-C   I examined and evaluated the patient with Hazel Sams PA.  Patient had worsening of her interstitial lung disease.  Her arthritis is  quite well controlled.  Per recommendation of pulmonologist we counseled patient to switch to Imuran.  Methotrexate will be discontinued and she will be starting Imuran.  Indications side effects contraindications were discussed at length.  She will be starting at 50 mg p.o. daily and increase it 250 mg p.o. daily if the labs are stable.  She will have labs every 2 weeks x 3 and then every 2 months to monitor for drug toxicity.  The plan of care was discussed as noted above.  Bo Merino, MD  Note - This record has been created using Editor, commissioning.  Chart creation errors have been sought, but may not always  have been located. Such creation errors do not reflect on  the standard of medical care.

## 2018-04-18 NOTE — Telephone Encounter (Signed)
FYI: Patient calling in regards to Pulmonary appt yesterday. Dr.  there is concerned about MTX. Their office is sending notes, to include CT report to Dr. Estanislado Pandy. Patient has Fibrosis in lungs that showed up on CT, and wants doctor to be aware of this in regards to MTX.

## 2018-04-18 NOTE — Telephone Encounter (Signed)
Patient advised of recommendation. Patient will come to the office to have lab drawn today and has been scheduled for an appointment 04/24/18.

## 2018-04-21 ENCOUNTER — Telehealth: Payer: Self-pay | Admitting: Pulmonary Disease

## 2018-04-21 ENCOUNTER — Ambulatory Visit (HOSPITAL_COMMUNITY)
Admission: RE | Admit: 2018-04-21 | Discharge: 2018-04-21 | Disposition: A | Discharge status: Home / Self Care | Payer: Federal, State, Local not specified - PPO | Source: Ambulatory Visit | Attending: Gastroenterology | Admitting: Gastroenterology

## 2018-04-21 DIAGNOSIS — R1310 Dysphagia, unspecified: Secondary | ICD-10-CM | POA: Insufficient documentation

## 2018-04-21 DIAGNOSIS — R1011 Right upper quadrant pain: Secondary | ICD-10-CM | POA: Insufficient documentation

## 2018-04-21 DIAGNOSIS — R1110 Vomiting, unspecified: Secondary | ICD-10-CM | POA: Diagnosis not present

## 2018-04-22 ENCOUNTER — Ambulatory Visit (HOSPITAL_COMMUNITY)
Admission: RE | Admit: 2018-04-22 | Discharge: 2018-04-22 | Disposition: A | Discharge status: Home / Self Care | Payer: Federal, State, Local not specified - PPO | Source: Ambulatory Visit | Attending: Gastroenterology | Admitting: Gastroenterology

## 2018-04-22 DIAGNOSIS — R1011 Right upper quadrant pain: Secondary | ICD-10-CM

## 2018-04-22 DIAGNOSIS — R1310 Dysphagia, unspecified: Secondary | ICD-10-CM | POA: Diagnosis not present

## 2018-04-22 MED ORDER — TECHNETIUM TC 99M MEBROFENIN IV KIT
5.3000 | PACK | Freq: Once | INTRAVENOUS | Status: AC | PRN
  Administered 2018-04-22: 5.3 via INTRAVENOUS

## 2018-04-22 NOTE — Progress Notes (Signed)
Reviewed and agree with assessment/plan.   Dezeray Puccio, MD [facility] 08/01/2016, 12:24 PM Pager:  [phone]  

## 2018-04-23 LAB — THIOPURINE METHYLTRANSFERASE (TPMT), RBC: THIOPURINE METHYLTRANSFERASE, RBC: 17 nmol/h/mL

## 2018-04-23 NOTE — Telephone Encounter (Signed)
rec'd purple folder from [employer] and placed on VS desk to be completed VS is aware of the folder needing to be completed and signed.  VS please let me know when completed and signed. Thank you.

## 2018-04-24 ENCOUNTER — Encounter: Payer: Self-pay | Admitting: Physician Assistant

## 2018-04-24 ENCOUNTER — Ambulatory Visit: Payer: Federal, State, Local not specified - PPO | Admitting: Rheumatology

## 2018-04-24 VITALS — BP 118/78 | HR 75 | Resp 16 | Ht 67.5 in | Wt 278.6 lb

## 2018-04-24 DIAGNOSIS — M19041 Primary osteoarthritis, right hand: Secondary | ICD-10-CM

## 2018-04-24 DIAGNOSIS — M19042 Primary osteoarthritis, left hand: Secondary | ICD-10-CM

## 2018-04-24 DIAGNOSIS — Z79899 Other long term (current) drug therapy: Secondary | ICD-10-CM

## 2018-04-24 DIAGNOSIS — M19072 Primary osteoarthritis, left ankle and foot: Secondary | ICD-10-CM

## 2018-04-24 DIAGNOSIS — M3320 Polymyositis, organ involvement unspecified: Secondary | ICD-10-CM | POA: Diagnosis not present

## 2018-04-24 DIAGNOSIS — M0579 Rheumatoid arthritis with rheumatoid factor of multiple sites without organ or systems involvement: Secondary | ICD-10-CM | POA: Diagnosis not present

## 2018-04-24 DIAGNOSIS — J479 Bronchiectasis, uncomplicated: Secondary | ICD-10-CM

## 2018-04-24 DIAGNOSIS — Z8639 Personal history of other endocrine, nutritional and metabolic disease: Secondary | ICD-10-CM

## 2018-04-24 DIAGNOSIS — M19071 Primary osteoarthritis, right ankle and foot: Secondary | ICD-10-CM

## 2018-04-24 DIAGNOSIS — M797 Fibromyalgia: Secondary | ICD-10-CM

## 2018-04-24 DIAGNOSIS — R768 Other specified abnormal immunological findings in serum: Secondary | ICD-10-CM

## 2018-04-24 DIAGNOSIS — R5383 Other fatigue: Secondary | ICD-10-CM

## 2018-04-24 DIAGNOSIS — R1011 Right upper quadrant pain: Secondary | ICD-10-CM | POA: Diagnosis not present

## 2018-04-24 DIAGNOSIS — L52 Erythema nodosum: Secondary | ICD-10-CM

## 2018-04-24 DIAGNOSIS — R112 Nausea with vomiting, unspecified: Secondary | ICD-10-CM | POA: Diagnosis not present

## 2018-04-24 DIAGNOSIS — Z8669 Personal history of other diseases of the nervous system and sense organs: Secondary | ICD-10-CM

## 2018-04-24 DIAGNOSIS — Z8679 Personal history of other diseases of the circulatory system: Secondary | ICD-10-CM

## 2018-04-24 MED ORDER — AZATHIOPRINE 50 MG PO TABS: 50.0000 mg | ORAL_TABLET | Freq: Every day | ORAL | 2 refills | Status: DC

## 2018-04-24 NOTE — Patient Instructions (Addendum)
Standing Labs We placed an order today for your standing lab work.    Please come back and get your standing labs in 2 weeks, then 4 weeks, then 8 weeks, then every 3 months.  We have open lab Monday through Friday from 8:30-11:30 AM and 1:30-4:00 PM  at the office of Dr. Bo Merino.   You may experience shorter wait times on Monday and Friday afternoons. The office is located at [address], [place], [place], [place] [address] No appointment is necessary.   Labs are drawn by [facility].  You may receive a bill from [facility] for your lab work. If you have any questions regarding directions or hours of operation,  please call [phone].    Vaccines You are taking a medication(s) that can suppress your immune system.  The following immunizations are recommended: . Flu annually . Pneumonia . Shingles . Hepatitis B  Please check with your PCP to make sure you are up to date.  Azathioprine tablets What is this medicine? AZATHIOPRINE (ay za THYE oh preen) suppresses the immune system. It is used to prevent organ rejection after a transplant. It is also used to treat rheumatoid arthritis. This medicine may be used for other purposes; ask your health care provider or pharmacist if you have questions. COMMON BRAND NAME(S): Azasan, Imuran What should I tell my health care provider before I take this medicine? They need to know if you have any of these conditions: -infection -kidney disease -liver disease -an unusual or allergic reaction to azathioprine, other medicines, lactose, foods, dyes, or preservatives -pregnant or trying to get pregnant -breast feeding How should I use this medicine? Take this medicine by mouth with a full glass of water. Follow the directions on the prescription label. Take your medicine at regular intervals. Do not take your medicine more often than directed. Continue to take your medicine even if you feel better. Do not stop taking except on your  doctor's advice. Talk to your pediatrician regarding the use of this medicine in children. Special care may be needed. Overdosage: If you think you have taken too much of this medicine contact a poison control center or emergency room at once. NOTE: This medicine is only for you. Do not share this medicine with others. What if I miss a dose? If you miss a dose, take it as soon as you can. If it is almost time for your next dose, take only that dose. Do not take double or extra doses. What may interact with this medicine? Do not take this medicine with any of the following medications: -febuxostat -mercaptopurine This medicine may also interact with the following medications: -allopurinol -aminosalicylates like sulfasalazine, mesalamine, balsalazide, and olsalazine -leflunomide -medicines called ACE inhibitors like benazepril, captopril, enalapril, fosinopril, quinapril, lisinopril, ramipril, and trandolapril -mycophenolate -sulfamethoxazole; trimethoprim -vaccines -warfarin This list may not describe all possible interactions. Give your health care provider a list of all the medicines, herbs, non-prescription drugs, or dietary supplements you use. Also tell them if you smoke, drink alcohol, or use illegal drugs. Some items may interact with your medicine. What should I watch for while using this medicine? Visit your doctor or health care professional for regular checks on your progress. You will need frequent blood checks during the first few months you are receiving the medicine. If you get a cold or other infection while receiving this medicine, call your doctor or health care professional. Do not treat yourself. The medicine may increase your risk of getting an infection.  Women should inform their doctor if they wish to become pregnant or think they might be pregnant. There is a potential for serious side effects to an unborn child. Talk to your health care professional or pharmacist for  more information. Men may have a reduced sperm count while they are taking this medicine. Talk to your health care professional for more information. This medicine may increase your risk of getting certain kinds of cancer. Talk to your doctor about healthy lifestyle choices, important screenings, and your risk. What side effects may I notice from receiving this medicine? Side effects that you should report to your doctor or health care professional as soon as possible: -allergic reactions like skin rash, itching or hives, swelling of the face, lips, or tongue -changes in vision -confusion -fever, chills, or any other sign of infection -loss of balance or coordination -severe stomach pain -unusual bleeding, bruising -unusually weak or tired -vomiting -yellowing of the eyes or skin Side effects that usually do not require medical attention (report to your doctor or health care professional if they continue or are bothersome): -hair loss -nausea This list may not describe all possible side effects. Call your doctor for medical advice about side effects. You may report side effects to FDA at 1-800-FDA-1088. Where should I keep my medicine? Keep out of the reach of children. Store at room temperature between 15 and 25 degrees C (59 and 77 degrees F). Protect from light. Throw away any unused medicine after the expiration date. NOTE: This sheet is a summary. It may not cover all possible information. If you have questions about this medicine, talk to your doctor, pharmacist, or health care provider.  2018 Elsevier/Gold Standard (2013-11-17 12:00:31)

## 2018-04-24 NOTE — Progress Notes (Signed)
Pharmacy Counseling Note  Patient was counseled on the purpose, proper use, and adverse effects of azathioprine including risk of infection, nausea, rash, and hair loss.  Reviewed risk of cancer after long term use.  Discussed risk of myelosupression and reviewed importance of frequent lab work to monitor blood counts.  Reviewed drug-drug interactions including contraindication with allopurinol.  Provided patient with educational materials on azathioprine and answered all questions.  Patient consented to azathioprine.  Will upload consent into the media tab.   Mariella Saa, PharmD, [facility] Clinical Pharmacist  04/24/2018 3:15 PM

## 2018-04-25 ENCOUNTER — Telehealth: Payer: Self-pay | Admitting: Pulmonary Disease

## 2018-04-25 MED ORDER — LEVOFLOXACIN 500 MG PO TABS: 500.0000 mg | ORAL_TABLET | Freq: Every day | ORAL | 0 refills | Status: DC

## 2018-04-25 NOTE — Telephone Encounter (Signed)
I have called the patient is still coughing and producing mucus yellow in color. She saw Aaron Edelman NP on 04/17/18. She was put on amoxicillin and prednisone and will finish it tomorrow. But has had no improvement she actually feels worse.  [name] please advise.

## 2018-04-25 NOTE — Telephone Encounter (Signed)
Send script for levaquin 500 mg daily, #7 with no refills.  She can stop taking amoxicillin once she starts levaquin.  She should call back on Monday if not feeling better.

## 2018-04-25 NOTE — Telephone Encounter (Signed)
Spoke with patient-aware of recs and Rx from [facility]. Will call back Monday if no better. Nothing more needed at this time.

## 2018-04-28 ENCOUNTER — Telehealth: Payer: Self-pay | Admitting: Rheumatology

## 2018-04-28 NOTE — Telephone Encounter (Signed)
Please advise if these have been completed. Thanks!

## 2018-04-28 NOTE — Telephone Encounter (Signed)
Patient called to let Dr. Estanislado Pandy know that [name], Pulmonologist put her on a stronger antibiotic (Levofloxacin) which she will take until Thursday.  Patient states she will start her Imuran on Friday 05/02/18 and then have her labwork done 2 weeks later.

## 2018-05-07 NOTE — Telephone Encounter (Signed)
Awaiting completion of forms. Will route to [facility].   [facility] please advise. Thanks.

## 2018-05-13 NOTE — Telephone Encounter (Signed)
[name] - please advise if this paperwork has been completed. Thanks.

## 2018-05-15 NOTE — Telephone Encounter (Signed)
Forms have been retrieved from Dr. Juanetta Gosling desk and given to [facility].

## 2018-05-15 NOTE — Telephone Encounter (Signed)
Form completed.

## 2018-05-16 ENCOUNTER — Encounter: Payer: Self-pay | Admitting: Pulmonary Disease

## 2018-05-16 ENCOUNTER — Ambulatory Visit: Payer: Federal, State, Local not specified - PPO | Admitting: Pulmonary Disease

## 2018-05-16 VITALS — BP 124/80 | HR 85 | Ht 68.0 in | Wt 282.6 lb

## 2018-05-16 DIAGNOSIS — M069 Rheumatoid arthritis, unspecified: Secondary | ICD-10-CM | POA: Diagnosis not present

## 2018-05-16 DIAGNOSIS — R011 Cardiac murmur, unspecified: Secondary | ICD-10-CM | POA: Diagnosis not present

## 2018-05-16 DIAGNOSIS — M3320 Polymyositis, organ involvement unspecified: Secondary | ICD-10-CM

## 2018-05-16 DIAGNOSIS — J479 Bronchiectasis, uncomplicated: Secondary | ICD-10-CM

## 2018-05-16 DIAGNOSIS — J849 Interstitial pulmonary disease, unspecified: Secondary | ICD-10-CM | POA: Diagnosis not present

## 2018-05-16 MED ORDER — GUAIFENESIN ER 600 MG PO TB12: 1200.0000 mg | ORAL_TABLET | Freq: Two times a day (BID) | ORAL | Status: AC | PRN

## 2018-05-16 MED ORDER — ALBUTEROL SULFATE (2.5 MG/3ML) 0.083% IN NEBU: 2.5000 mg | INHALATION_SOLUTION | Freq: Four times a day (QID) | RESPIRATORY_TRACT | 5 refills | Status: AC | PRN

## 2018-05-16 NOTE — Progress Notes (Signed)
[facility]  Chief Complaint  Patient presents with  . Follow-up    Pt has increase of SOB with exertion since last ov with B.Mack. Pt has some wheezing, has productive cough- clear/yellow.     Constitutional:  BP 124/80 (BP Location: Left Arm, Cuff Size: Normal)   Pulse 85   Ht 5' 8"  (1.727 m)   Wt 282 lb 9.6 oz (128.2 kg)   SpO2 97%   BMI 42.97 kg/m   Past Medical History:  Fibromyalgia, HA, MVP  Brief Summary:  Jill Harper is a 52 y.o. female with bronchiectasis in the setting of rheumatoid arthritis and polymyositis.  Since her last visit with me she had CT chest that showed progression of ILD (reviewed by me).  She was seen by Dr. Estanislado Pandy.  Transitioned off MTX and started imuran couple weeks ago.  Not a full dose yet.  She is getting more cough with clear to yellow sputum.  Not having fever, hemoptysis, sinus congestion, or sore throat.  Not having trouble standing.  Gets winded with minimal activity.  She has been using flutter valve 4 times per day.  This wears her out.  She has a nebulizer machine at home but hasn't been using.  She hasn't been using mucinex.  Maintain oxygen level with ambulation on room air today, but only could do two laps.  Heart rate up to 112.  Physical Exam:   Appearance - well kempt  ENMT - clear nasal mucosa, midline nasal septum, no oral exudates, no LAN, trachea midline Respiratory - normal chest wall, normal respiratory effort, no accessory muscle use, no wheeze/rales CV - s1s2 regular rate and rhythm, 2/6 systolic murmurs, no peripheral edema, radial pulses symmetric GI - soft, non tender, no masses Lymph - no adenopathy noted in neck and axillary areas MSK - normal muscle strength and tone, normal gait Ext - no cyanosis, clubbing, or joint inflammation noted Skin - no rashes, lesions, or ulcers Neuro - oriented to person, place, and time Psych - normal mood and affect   Assessment/Plan:    ILD and Bronchiectasis in setting of RA and polymyositis. - transitioning to imuran - continue breo - mucinex, flutter valve bid, albuterol nebulizer bid - don't think she needs ABx or prednisone at this time  More prominent murmur with hx of MVP. - repeat Echo  Dyspnea on exertion. - she can't walk more than 200 feet w/o having to stop and rest - handicap parking form completed - obesity and deconditioning could be contributing also   Patient Instructions  Mucinex twice per day as needed to help clear phlegm Albuterol nebulized twice per day as needed to help clear phlegm Flutter twice per day as needed after using albuterol Will schedule Echocardiogram Follow up in 6 weeks    Chesley Mires, MD [place] Pager: [phone] 05/16/2018, 4:51 PM  Flow Sheet     Chest imaging:  CT chest 03/23/05 >> patchy b/l lower lung ASD with cylindrical BTX CT chest 02/17/08 >> peripheral and basilar predominant subpleural GGO CT chest 12/01/08 >> no change HRCT chest 09/07/15 >> scattered GGO HRCT chest 04/15/18 >> patchy confluent subpleural and peripheral peribronchovascular reticulation and ground-glass opacity throughout both lungs with a strong basilar gradient, traction BTX  Pulmonary tests:  PFT 10/30/11 >> FEV1 3.31 (114%), FEV1% 85, TLC 6.06 (108%), DLCO 77%, no BD PFT 11/18/12 >> FEV1 3.18 (111%), FEV1% 84, TLC 5.34 (95%), DLCO 67%, no BD PFT 12/02/15 >>  FEV1 2.96 (108%), FEV1% 92, TLC 4.95 (86%), DLCO 65%, no BD Serology 01/31/17 >> ANA negative, RF 29, anti CCP 213, SCL 70 negative, SSA/SSB negative, ENA SM negative, Jo-1 positive Quantiferon gold 01/31/17 >> negative PFT 04/17/18 >> FEV1 2.46 (101%), FEV1% 90, DLCO 76%  Cardiac tests:  Echo 05/29/17 >> EF 60 to 65%, mod LVH, grade 1 DD   Medications:   Allergies as of 05/16/2018      Reactions   Sulfonamide Derivatives    REACTION: hives      Medication List        Accurate as of 05/16/18   4:51 PM. Always use your most recent med list.          albuterol 108 (90 Base) MCG/ACT inhaler Commonly known as:  PROVENTIL HFA;VENTOLIN HFA Inhale 2 puffs into the lungs 4 (four) times daily as needed. Reported on 09/03/2015   albuterol (2.5 MG/3ML) 0.083% nebulizer solution Commonly known as:  PROVENTIL Take 3 mLs (2.5 mg total) by nebulization every 6 (six) hours as needed for wheezing or shortness of breath.   atenolol 25 MG tablet Commonly known as:  TENORMIN TAKE 1/2 TABLET BY MOUTH DAILY AS NEEDED FOR SYMPTOMS OF MITRAL PROLAPSE   azaTHIOprine 50 MG tablet Commonly known as:  IMURAN Take 1 tablet (50 mg total) by mouth daily.   cyclobenzaprine 10 MG tablet Commonly known as:  FLEXERIL Take 5 mg by mouth at bedtime as needed. Reported on 09/03/2015   diclofenac sodium 1 % Gel Commonly known as:  VOLTAREN Apply 2 g topically 4 (four) times daily as needed (pain).   eletriptan 20 MG tablet Commonly known as:  RELPAX One tablet by mouth at onset of headache. May repeat in 2 hours if headache persists or recurs.   fluticasone furoate-vilanterol 100-25 MCG/INH Aepb Commonly known as:  BREO ELLIPTA Inhale 1 puff into the lungs daily.   FLUTTER Devi Use as directed   folic acid 1 MG tablet Commonly known as:  FOLVITE Take 2 tablets (2 mg total) by mouth daily.   guaiFENesin 600 MG 12 hr tablet Commonly known as:  MUCINEX Take 2 tablets (1,200 mg total) by mouth 2 (two) times daily as needed for cough or to loosen phlegm.   traMADol 50 MG tablet Commonly known as:  ULTRAM Take 50 mg by mouth every 6 (six) hours as needed for moderate pain. Reported on 09/03/2015   Vitamin D (Ergocalciferol) 50000 units Caps capsule Commonly known as:  DRISDOL Take 1 capsule (50,000 Units total) by mouth every 30 (thirty) days.       Past Surgical History:  She  has a past surgical history that includes Cardiac catheterization (2002) and Dilation and curettage of uterus  (08-31-08).  Family History:  Her family history includes Diabetes in her brother, mother, sister, and sister; Fibromyalgia in her mother; Heart attack in her sister; Heart disease in her brother; Hypertension in her sister and sister; Lupus in her sister; Multiple myeloma in her father and sister; Other in her sister; Rheum arthritis in her sister and sister; Ulcers in her mother.  Social History:  She  reports that she has never smoked. She has never used smokeless tobacco. She reports that she does not drink alcohol or use drugs.

## 2018-05-16 NOTE — Patient Instructions (Signed)
Mucinex twice per day as needed to help clear phlegm Albuterol nebulized twice per day as needed to help clear phlegm Flutter twice per day as needed after using albuterol Will schedule Echocardiogram Follow up in 6 weeks

## 2018-05-16 NOTE — Telephone Encounter (Signed)
Rec'd completed paperwork from [place] to [employer] via [employer] -pr

## 2018-05-19 ENCOUNTER — Other Ambulatory Visit: Payer: Self-pay | Admitting: *Deleted

## 2018-05-19 DIAGNOSIS — Z79899 Other long term (current) drug therapy: Secondary | ICD-10-CM

## 2018-05-19 LAB — CBC WITH DIFFERENTIAL/PLATELET
BASOS ABS: 41 {cells}/uL (ref 0–200)
Basophils Relative: 0.5 %
EOS PCT: 2.1 %
Eosinophils Absolute: 172 cells/uL (ref 15–500)
HCT: 43 % (ref 35.0–45.0)
Hemoglobin: 14.2 g/dL (ref 11.7–15.5)
LYMPHS ABS: 1533 {cells}/uL (ref 850–3900)
MCH: 27.8 pg (ref 27.0–33.0)
MCHC: 33 g/dL (ref 32.0–36.0)
MCV: 84.1 fL (ref 80.0–100.0)
MONOS PCT: 9.3 %
MPV: 11.4 fL (ref 7.5–12.5)
NEUTROS PCT: 69.4 %
Neutro Abs: 5691 cells/uL (ref 1500–7800)
Platelets: 261 10*3/uL (ref 140–400)
RBC: 5.11 10*6/uL — ABNORMAL HIGH (ref 3.80–5.10)
RDW: 12.6 % (ref 11.0–15.0)
Total Lymphocyte: 18.7 %
WBC mixed population: 763 cells/uL (ref 200–950)
WBC: 8.2 10*3/uL (ref 3.8–10.8)

## 2018-05-19 LAB — COMPLETE METABOLIC PANEL WITH GFR
AG Ratio: 1.2 (calc) (ref 1.0–2.5)
ALBUMIN MSPROF: 4 g/dL (ref 3.6–5.1)
ALKALINE PHOSPHATASE (APISO): 87 U/L (ref 33–130)
ALT: 19 U/L (ref 6–29)
AST: 20 U/L (ref 10–35)
BUN: 9 mg/dL (ref 7–25)
CALCIUM: 9.4 mg/dL (ref 8.6–10.4)
CO2: 26 mmol/L (ref 20–32)
CREATININE: 0.71 mg/dL (ref 0.50–1.05)
Chloride: 107 mmol/L (ref 98–110)
GFR, EST NON AFRICAN AMERICAN: 98 mL/min/{1.73_m2} (ref >=60)
GFR, Est African American: 113 mL/min/{1.73_m2} (ref >=60)
Globulin: 3.4 g/dL (calc) (ref 1.9–3.7)
Glucose, Bld: 88 mg/dL (ref 65–99)
Potassium: 4.4 mmol/L (ref 3.5–5.3)
Sodium: 141 mmol/L (ref 135–146)
TOTAL PROTEIN: 7.4 g/dL (ref 6.1–8.1)
Total Bilirubin: 0.6 mg/dL (ref 0.2–1.2)

## 2018-05-23 ENCOUNTER — Other Ambulatory Visit: Payer: Self-pay

## 2018-05-23 ENCOUNTER — Ambulatory Visit (HOSPITAL_COMMUNITY): Payer: Federal, State, Local not specified - PPO | Attending: Cardiology

## 2018-05-23 DIAGNOSIS — R011 Cardiac murmur, unspecified: Secondary | ICD-10-CM | POA: Diagnosis not present

## 2018-05-23 MED ORDER — PERFLUTREN LIPID MICROSPHERE
1.0000 mL | INTRAVENOUS | Status: AC | PRN
  Administered 2018-05-23: 2 mL via INTRAVENOUS

## 2018-06-01 ENCOUNTER — Other Ambulatory Visit: Payer: Self-pay | Admitting: Internal Medicine

## 2018-06-01 ENCOUNTER — Other Ambulatory Visit: Payer: Self-pay | Admitting: Pulmonary Disease

## 2018-06-06 ENCOUNTER — Other Ambulatory Visit: Payer: Self-pay | Admitting: Rheumatology

## 2018-06-09 DIAGNOSIS — R0609 Other forms of dyspnea: Principal | ICD-10-CM

## 2018-06-09 DIAGNOSIS — R0600 Dyspnea, unspecified: Secondary | ICD-10-CM

## 2018-06-09 NOTE — Telephone Encounter (Signed)
Dr. Sood please advise on patients e-mail below. Thank you.  

## 2018-06-10 NOTE — Telephone Encounter (Signed)
Echo 05/23/18 >> mod LVH, EF 60 to 65%, grade 1 DD, mild MR   Discussed results with patient. She continues to have dyspnea.  Concerned she could also have underlying cardiac disease as a contributor in addition to interstitial lung disease.  Will arrange for referral to cardiology for further assessment.

## 2018-06-15 NOTE — Progress Notes (Signed)
@Patient  ID: Jill Harper, female    DOB: [birth year], 52 y.o.   MRN: [id number]  Chief Complaint  Patient presents with  . Follow-up    ILD, now on Imuran, SOB unchanged     Referring provider: Binnie Rail, MD  HPI:  52 year old female followed in our office for bronchiectasis in the setting of rheumatoid arthritis and polymyositis  PMH: Rheumatoid arthritis, polymyositis, maintained on Imuran per rheumatology Smoker/ Smoking History: Never smoker Maintenance: Breo Ellipta 100 Pt of: [name]  06/16/2018  - Visit   52 year old female patient presenting today for follow-up visit.  I last office visit the murmur was heard and patient was referred to cardiology as well as echocardiogram was ordered.  Patient is completed echocardiogram but will follow-up with cardiology later on this week.  Patient reports that shortness of breath is still present without change.  Patient reports she has a cough all the time.  It is still productive.  She believes the cough is tighter now.  Patient endorses shortness of breath and wheezing with exertion.  Patient also endorses headache with exertion and also morning headaches.   The patient was recently switched from methotrexate to Imuran.  She is being followed by rheumatology Dr. Estanislado Pandy.  Patient reports she will follow back up with them in January.  Is continuing to do lab work every 2 weeks following her Imuran.  Patient reports that initial lab work has been good and stable.   Tests:   Chest imaging: CT chest 03/23/05 >> patchy b/l lower lung ASD with cylindrical BTX CT chest 02/17/08 >> peripheral and basilar predominant subpleural GGO CT chest 12/01/08 >> no change HRCT chest 09/07/15 >> scattered GGO 04/15/2018-CT chest high-res- spectrum of findings compatible with basilar predominant fibrotic interstitial lung disease with progression since 2017,  UIP  Pulmonarytests: PFT 10/30/11 >> FEV1 3.31 (114%), FEV1% 85, TLC 6.06 (108%), DLCO  77%, no BD PFT 11/18/12 >> FEV1 3.18 (111%), FEV1% 84, TLC 5.34 (95%), DLCO 67%, no BD PFT 12/02/15 >> FEV1 2.96 (108%), FEV1% 92, TLC 4.95 (86%), DLCO 65%, no BD 04/17/2018-spirometry with DLCO-DLCO 76%  Serology 01/31/17 >> ANA negative, RF 29, anti CCP 213, SCL 70 negative, SSA/SSB negative, ENA SM negative, Jo-1 positive Quantiferon gold 01/31/17 >> negative  Cardiac tests: Echo 05/29/17 >> EF 60 to 65%, mod LVH, grade 1 DD Echo 05/23/18 >> mod LVH, EF 60 to 65%, grade 1 DD, mild MR  Results of the Epworth flowsheet 06/16/2018  Sitting and reading 0  Watching TV 1  Sitting, inactive in a public place (e.g. a theatre or a meeting) 0  As a passenger in a car for an hour without a break 2  Lying down to rest in the afternoon when circumstances permit 3  Sitting and talking to someone 0  Sitting quietly after a lunch without alcohol 1  In a car, while stopped for a few minutes in traffic 0  Total score 7     FENO:  Lab Results  Component Value Date   NITRICOXIDE 9 12/11/2017    PFT: PFT Results Latest Ref Rng & Units 04/17/2018 12/02/2015  FVC-Pre L 2.74 3.40  FVC-Predicted Pre % 90 99  FVC-Post L - 3.24  FVC-Predicted Post % - 94  Pre FEV1/FVC % % 90 87  Post FEV1/FCV % % - 92  FEV1-Pre L 2.46 2.97  FEV1-Predicted Pre % 101 108  FEV1-Post L - 2.96  DLCO UNC% % 76 65  DLCO COR %  Predicted % 98 79  TLC L - 4.95  TLC % Predicted % - 86  RV % Predicted % - 76    Imaging: No results found.  Chart Review:    Specialty Problems      Pulmonary Problems   BRONCHIECTASIS    Dr Halford Chessman  CT sinus 09/07/2015 neg for acute dz.  HRCT 09/07/2015 >Basilar predominant peribronchovascular ground-glass is indicative of bronchopneumonia. . No definitive evidence of underlying interstitial lung disease.            Throat clearing   Cough   Wheezing   Bronchiectasis (HCC)   Dyspnea    PFT 12/02/15 >> FEV1 2.96 (108%), FEV1% 92, TLC 4.95 (86%), DLCO 65%, no BD  HRCT chest  09/07/15 >> scattered GGO      ILD (interstitial lung disease) ([place])    HRCT chest 09/07/15 >> scattered GGO 04/15/2018-CT chest high-res- spectrum of findings compatible with basilar predominant fibrotic interstitial lung disease with progression since 2017,  UIP  Pulmonarytests: PFT 10/30/11 >> FEV1 3.31 (114%), FEV1% 85, TLC 6.06 (108%), DLCO 77%, no BD PFT 11/18/12 >> FEV1 3.18 (111%), FEV1% 84, TLC 5.34 (95%), DLCO 67%, no BD PFT 12/02/15 >> FEV1 2.96 (108%), FEV1% 92, TLC 4.95 (86%), DLCO 65%, no BD 04/17/2018-spirometry with DLCO-DLCO 76%  Serology 01/31/17 >> ANA negative, RF 29, anti CCP 213, SCL 70 negative, SSA/SSB negative, ENA SM negative, Jo-1 positive Quantiferon gold 01/31/17 >> negative          Allergies  Allergen Reactions  . Sulfonamide Derivatives     REACTION: hives    Immunization History  Administered Date(s) Administered  . Influenza Split 04/10/2011, 06/18/2012, 05/06/2013  . Influenza Whole 05/19/2008, 05/15/2009, 06/01/2010  . Influenza,inj,Quad PF,6+ Mos 04/20/2014, 05/26/2015, 05/31/2016, 04/05/2017, 06/16/2018  . Pneumococcal Conjugate-13 01/29/2017  . Pneumococcal Polysaccharide-23 08/07/2007, 06/16/2018  . Td 07/12/2009   Flu today  Pneumovax today   Past Medical History:  Diagnosis Date  . Abnormal liver function test   . Bronchiectasis       . Fibromyalgia   . Menorrhagia   . MIGRAINE HEADACHE   . MVP (mitral valve prolapse)   . Polymyositis ([place])    Dr [name]; chronic MTX    Tobacco History: Social History   Tobacco Use  Smoking Status Never Smoker  Smokeless Tobacco Never Used  Tobacco Comment   Married, lives with spouse. 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 works at [name] in [employer] [place] [employer]  Continue to not smoke   Outpatient Encounter Medications as of 06/16/2018  Medication Sig  .  albuterol (PROVENTIL HFA) 108 (90 Base) MCG/ACT inhaler Inhale 2 puffs into the lungs 4 (four) times daily as needed. Reported on 09/03/2015  . albuterol (PROVENTIL) (2.5 MG/3ML) 0.083% nebulizer solution Take 3 mLs (2.5 mg total) by nebulization every 6 (six) hours as needed for wheezing or shortness of breath.  [name] atenolol (TENORMIN) 25 MG tablet TAKE 1/2 TABLET BY MOUTH DAILY AS NEEDED FOR SYMPTOMS OF MITRAL PROLAPSE  . azaTHIOprine (IMURAN) 50 MG tablet Take 1 tablet (50 mg total) by mouth daily.  . cyclobenzaprine (FLEXERIL) 10 MG tablet Take 5 mg by mouth at bedtime as needed. Reported on 09/03/2015  . diclofenac sodium (VOLTAREN) 1 % GEL Apply 2 g topically 4 (four) times daily as needed (pain).  [name] eletriptan (RELPAX) 20 MG tablet One tablet  by mouth at onset of headache. May repeat in 2 hours if headache persists or recurs.  . fluticasone furoate-vilanterol (BREO ELLIPTA) 100-25 MCG/INH AEPB Inhale 1 puff into the lungs daily.  . folic acid (FOLVITE) 1 MG tablet Take 2 tablets (2 mg total) by mouth daily.  [name] guaiFENesin (MUCINEX) 600 MG 12 hr tablet Take 2 tablets (1,200 mg total) by mouth 2 (two) times daily as needed for cough or to loosen phlegm.  [name] Respiratory Therapy Supplies (FLUTTER) DEVI Use as directed (Patient taking differently: 4 (four) times daily. Use as directed)  . traMADol (ULTRAM) 50 MG tablet Take 50 mg by mouth every 6 (six) hours as needed for moderate pain. Reported on 09/03/2015  . Vitamin D, Ergocalciferol, (DRISDOL) 50000 units CAPS capsule Take 1 capsule (50,000 Units total) by mouth every 30 (thirty) days.  . [DISCONTINUED] pneumococcal 23 valent vaccine (PNEUMOVAX 23) 25 MCG/0.5ML injection Inject 0.5 mLs into the muscle tomorrow at 10 am for 1 dose.   No facility-administered encounter medications on file as of 06/16/2018.      Review of Systems  Review of Systems  Constitutional: Positive for fatigue (all the time ). Negative for chills, fever and unexpected  weight change.  HENT: Negative for congestion, ear pain, postnasal drip, sinus pressure and sinus pain.   Respiratory: Positive for cough (productive cough ) and shortness of breath (all the time ). Negative for chest tightness and wheezing.   Cardiovascular: Negative for chest pain and palpitations.  Gastrointestinal: Negative for blood in stool, diarrhea, nausea and vomiting.  Genitourinary: Negative for dysuria, frequency and urgency.  Musculoskeletal: Negative for arthralgias.  Skin: Negative for color change.  Allergic/Immunologic: Negative for environmental allergies and food allergies.  Neurological: Positive for dizziness, light-headedness and headaches (with exertion and in the morning ).  Psychiatric/Behavioral: Negative for dysphoric mood. The patient is not nervous/anxious.   All other systems reviewed and are negative.    Physical Exam  BP 122/88 (BP Location: Left Arm, Patient Position: Sitting, Cuff Size: Normal)   Pulse 98   Ht 5' 7.5" (1.715 m)   Wt 277 lb 6.4 oz (125.8 kg)   SpO2 98%   BMI 42.81 kg/m   Wt Readings from Last 5 Encounters:  06/16/18 277 lb 6.4 oz (125.8 kg)  05/16/18 282 lb 9.6 oz (128.2 kg)  04/24/18 278 lb 9.6 oz (126.4 kg)  04/17/18 275 lb 12.8 oz (125.1 kg)  04/01/18 275 lb 12.8 oz (125.1 kg)    Physical Exam  Constitutional: She is oriented to person, place, and time and well-developed, well-nourished, and in no distress. Vital signs are normal. No distress.  HENT:  Head: Normocephalic and atraumatic.  Right Ear: Hearing, tympanic membrane, external ear and ear canal normal.  Left Ear: Hearing, tympanic membrane, external ear and ear canal normal.  Nose: Nose normal. Right sinus exhibits no maxillary sinus tenderness and no frontal sinus tenderness. Left sinus exhibits no maxillary sinus tenderness and no frontal sinus tenderness.  Mouth/Throat: Uvula is midline and oropharynx is clear and moist. No oropharyngeal exudate.  +mallampati II    Eyes: Pupils are equal, round, and reactive to light.  Neck: Normal range of motion. Neck supple. No JVD present.  Cardiovascular: Normal rate and regular rhythm.  Murmur heard. Pulmonary/Chest: Effort normal and breath sounds normal. No accessory muscle usage. No respiratory distress. She has no decreased breath sounds. She has no wheezes. She has no rhonchi.  +BB crackles L>R  Musculoskeletal: Normal range of motion.  She exhibits no edema.  Lymphadenopathy:    She has no cervical adenopathy.  Neurological: She is alert and oriented to person, place, and time. Gait normal.  Skin: Skin is warm and dry. She is not diaphoretic. No erythema.  Psychiatric: Mood, memory, affect and judgment normal.  Nursing note and vitals reviewed.     Lab Results:  CBC    Component Value Date/Time   WBC 8.2 05/19/2018 1446   RBC 5.11 (H) 05/19/2018 1446   HGB 14.2 05/19/2018 1446   HCT 43.0 05/19/2018 1446   PLT 261 05/19/2018 1446   MCV 84.1 05/19/2018 1446   MCH 27.8 05/19/2018 1446   MCHC 33.0 05/19/2018 1446   RDW 12.6 05/19/2018 1446   LYMPHSABS 1,533 05/19/2018 1446   MONOABS 666 01/31/2017 1006   EOSABS 172 05/19/2018 1446   BASOSABS 41 05/19/2018 1446    BMET    Component Value Date/Time   NA 141 05/19/2018 1446   K 4.4 05/19/2018 1446   CL 107 05/19/2018 1446   CO2 26 05/19/2018 1446   GLUCOSE 88 05/19/2018 1446   BUN 9 05/19/2018 1446   CREATININE 0.71 05/19/2018 1446   CALCIUM 9.4 05/19/2018 1446   GFRNONAA 98 05/19/2018 1446   GFRAA 113 05/19/2018 1446    BNP No results found for: BNP  ProBNP    Component Value Date/Time   PROBNP 58.0 04/18/2009 1157      Assessment & Plan:   52 year old female patient completing follow-up with our office today.  Patient to complete follow-up with cardiology this week.  Patient follow-up with our office in 6 weeks with a 6-minute walk.  Flu vaccine and Pneumovax 23 today.  Patient to continue on Imuran will order home sleep  study to rule out overnight desaturations as well as sleep apnea.  ILD (interstitial lung disease) (HCC) 6 min walk before next OV   Follow-up in 6 weeks with [name] or [name], NP  Regular flu vaccine today  Pneumovax 23 today    Continue Breo Ellipta 100 >>> Take 1 puff daily in the morning right when you wake up >>>Rinse your mouth out after use >>>This is a daily maintenance inhaler, NOT a rescue inhaler >>>Contact our office if you are having difficulties affording or obtaining this medication >>>It is important for you to be able to take this daily and not miss any doses   Keep follow-up with rheumatology   Could definitely consider pulmonary rehab in the future once cleared from cardiology   Mitral regurgitation   Keep follow-up with cardiology  Could definitely consider pulmonary rehab in the future once cleared from cardiology   Healthcare maintenance  Epworth today 7 with morning headaches and snoring  >>>Home sleep study ordered   Regular flu vaccine today  Pneumovax 23 today    Could definitely consider pulmonary rehab in the future once cleared from cardiology   Suspected sleep apnea -Postmenopausal woman -Obesity -Large neck circumference  -Epworth today 7  -morning headaches -snoring  >>>Home sleep study ordered    Rheumatoid arthritis [facility]) Keep follow-up rheumatology  Continue [place], NP 06/16/2018

## 2018-06-16 ENCOUNTER — Encounter: Payer: Self-pay | Admitting: Pulmonary Disease

## 2018-06-16 ENCOUNTER — Ambulatory Visit: Payer: Federal, State, Local not specified - PPO | Admitting: Pulmonary Disease

## 2018-06-16 VITALS — BP 122/88 | HR 98 | Ht 67.5 in | Wt 277.4 lb

## 2018-06-16 DIAGNOSIS — I340 Nonrheumatic mitral (valve) insufficiency: Secondary | ICD-10-CM

## 2018-06-16 DIAGNOSIS — Z23 Encounter for immunization: Secondary | ICD-10-CM | POA: Diagnosis not present

## 2018-06-16 DIAGNOSIS — J849 Interstitial pulmonary disease, unspecified: Secondary | ICD-10-CM

## 2018-06-16 DIAGNOSIS — R29818 Other symptoms and signs involving the nervous system: Secondary | ICD-10-CM | POA: Insufficient documentation

## 2018-06-16 DIAGNOSIS — Z0000 Encounter for general adult medical examination without abnormal findings: Secondary | ICD-10-CM

## 2018-06-16 DIAGNOSIS — M069 Rheumatoid arthritis, unspecified: Secondary | ICD-10-CM

## 2018-06-16 MED ORDER — PNEUMOCOCCAL VAC POLYVALENT 25 MCG/0.5ML IJ INJ: 0.5000 mL | INJECTION | INTRAMUSCULAR | 0 refills | Status: DC

## 2018-06-16 NOTE — Assessment & Plan Note (Signed)
6 min walk before next OV   Follow-up in 6 weeks with [name] or [name], NP  Regular flu vaccine today  Pneumovax 23 today    Continue Breo Ellipta 100 >>> Take 1 puff daily in the morning right when you wake up >>>Rinse your mouth out after use >>>This is a daily maintenance inhaler, NOT a rescue inhaler >>>Contact our office if you are having difficulties affording or obtaining this medication >>>It is important for you to be able to take this daily and not miss any doses   Keep follow-up with rheumatology   Could definitely consider pulmonary rehab in the future once cleared from cardiology

## 2018-06-16 NOTE — Assessment & Plan Note (Addendum)
Keep follow-up rheumatology  Continue Imuran

## 2018-06-16 NOTE — Assessment & Plan Note (Signed)
  Epworth today 7 with morning headaches and snoring  >>>Home sleep study ordered   Regular flu vaccine today  Pneumovax 23 today    Could definitely consider pulmonary rehab in the future once cleared from cardiology

## 2018-06-16 NOTE — Assessment & Plan Note (Signed)
-  Postmenopausal woman -Obesity -Large neck circumference  -Epworth today 7  -morning headaches -snoring  >>>Home sleep study ordered

## 2018-06-16 NOTE — Assessment & Plan Note (Signed)
  Keep follow-up with cardiology  Could definitely consider pulmonary rehab in the future once cleared from cardiology

## 2018-06-16 NOTE — Patient Instructions (Addendum)
6 min walk before next OV   Follow-up in 6 weeks with [name] or [name], NP  [place] today 7 with morning headaches and snoring  >>>Home sleep study ordered   Regular flu vaccine today  Pneumovax 23 today    Continue Breo Ellipta 100 >>> Take 1 puff daily in the morning right when you wake up >>>Rinse your mouth out after use >>>This is a daily maintenance inhaler, NOT a rescue inhaler >>>Contact our office if you are having difficulties affording or obtaining this medication >>>It is important for you to be able to take this daily and not miss any doses   Keep follow-up with cardiology and rheumatology   Could definitely consider pulmonary rehab in the future once cleared from cardiology   November/2019 we will be moving! We will no longer be at our [place] location.  Be on the look out for a post card/mailer to let you know we have officially moved.  Our new address and phone number will be:  [address] [place], [place] [address] Telephone number: [phone]  It is flu season:   >>>Remember to be washing your hands regularly, using hand sanitizer, be careful to use around herself with has contact with people who are sick will increase her chances of getting sick yourself. >>> Best ways to protect herself from the flu: Receive the yearly flu vaccine, practice good hand hygiene washing with soap and also using hand sanitizer when available, eat a nutritious meals, get adequate rest, hydrate appropriately   Please contact the office if your symptoms worsen or you have concerns that you are not improving.   Thank you for choosing [facility] for your healthcare, and for allowing [name] to partner with you on your healthcare journey. I am thankful to be able to provide care to you today.   [name] FNP-C        Pneumococcal Vaccine, Polyvalent solution for injection What is this medicine? PNEUMOCOCCAL VACCINE, POLYVALENT (NEU mo KOK al vak SEEN,  pol ee VEY luhnt) is a vaccine to prevent pneumococcus bacteria infection. These bacteria are a major cause of ear infections, Strep throat infections, and serious pneumonia, meningitis, or blood infections worldwide. These vaccines help the body to produce antibodies (protective substances) that help your body defend against these bacteria. This vaccine is recommended for people 52 years of age and older with health problems. It is also recommended for all adults over 1 years old. This vaccine will not treat an infection. This medicine may be used for other purposes; ask your health care provider or pharmacist if you have questions. COMMON BRAND NAME(S): Pneumovax 23 What should I tell my health care provider before I take this medicine? They need to know if you have any of these conditions: -bleeding problems -bone marrow or organ transplant -cancer, Hodgkin's disease -fever -infection -immune system problems -low platelet count in the blood -seizures -an unusual or allergic reaction to pneumococcal vaccine, diphtheria toxoid, other vaccines, latex, other medicines, foods, dyes, or preservatives -pregnant or trying to get pregnant -breast-feeding How should I use this medicine? This vaccine is for injection into a muscle or under the skin. It is given by a health care professional. A copy of Vaccine Information Statements will be given before each vaccination. Read this sheet carefully each time. The sheet may change frequently. Talk to your pediatrician regarding the use of this medicine in children. While this drug may be prescribed for children as young  as 69 years of age for selected conditions, precautions do apply. Overdosage: If you think you have taken too much of this medicine contact a poison control center or emergency room at once. NOTE: This medicine is only for you. Do not share this medicine with others. What if I miss a dose? It is important not to miss your dose. Call your  doctor or health care professional if you are unable to keep an appointment. What may interact with this medicine? -medicines for cancer chemotherapy -medicines that suppress your immune function -medicines that treat or prevent blood clots like warfarin, enoxaparin, and dalteparin -steroid medicines like prednisone or cortisone This list may not describe all possible interactions. Give your health care provider a list of all the medicines, herbs, non-prescription drugs, or dietary supplements you use. Also tell them if you smoke, drink alcohol, or use illegal drugs. Some items may interact with your medicine. What should I watch for while using this medicine? Mild fever and pain should go away in 3 days or less. Report any unusual symptoms to your doctor or health care professional. What side effects may I notice from receiving this medicine? Side effects that you should report to your doctor or health care professional as soon as possible: -allergic reactions like skin rash, itching or hives, swelling of the face, lips, or tongue -breathing problems -confused -fever over 102 degrees F -pain, tingling, numbness in the hands or feet -seizures -unusual bleeding or bruising -unusual muscle weakness Side effects that usually do not require medical attention (report to your doctor or health care professional if they continue or are bothersome): -aches and pains -diarrhea -fever of 102 degrees F or less -headache -irritable -loss of appetite -pain, tender at site where injected -trouble sleeping This list may not describe all possible side effects. Call your doctor for medical advice about side effects. You may report side effects to FDA at 1-800-FDA-1088. Where should I keep my medicine? This does not apply. This vaccine is given in a clinic, pharmacy, doctor's office, or other health care setting and will not be stored at home. NOTE: This sheet is a summary. It may not cover all possible  information. If you have questions about this medicine, talk to your doctor, pharmacist, or health care provider.  2018 Elsevier/Gold Standard (2008-02-27 14:32:37)      Influenza Virus Vaccine injection What is this medicine? INFLUENZA VIRUS VACCINE (in floo EN zuh VAHY ruhs vak SEEN) helps to reduce the risk of getting influenza also known as the flu. The vaccine only helps protect you against some strains of the flu. This medicine may be used for other purposes; ask your health care provider or pharmacist if you have questions. COMMON BRAND NAME(S): Afluria, Agriflu, Alfuria, FLUAD, Fluarix, Fluarix Quadrivalent, Flublok, Flublok Quadrivalent, FLUCELVAX, Flulaval, Fluvirin, Fluzone, Fluzone High-Dose, Fluzone Intradermal What should I tell my health care provider before I take this medicine? They need to know if you have any of these conditions: -bleeding disorder like hemophilia -fever or infection -Guillain-Barre syndrome or other neurological problems -immune system problems -infection with the human immunodeficiency virus (HIV) or AIDS -low blood platelet counts -multiple sclerosis -an unusual or allergic reaction to influenza virus vaccine, latex, other medicines, foods, dyes, or preservatives. Different brands of vaccines contain different allergens. Some may contain latex or eggs. Talk to your doctor about your allergies to make sure that you get the right vaccine. -pregnant or trying to get pregnant -breast-feeding How should I use this medicine? This  vaccine is for injection into a muscle or under the skin. It is given by a health care professional. A copy of Vaccine Information Statements will be given before each vaccination. Read this sheet carefully each time. The sheet may change frequently. Talk to your healthcare provider to see which vaccines are right for you. Some vaccines should not be used in all age groups. Overdosage: If you think you have taken too much of this  medicine contact a poison control center or emergency room at once. NOTE: This medicine is only for you. Do not share this medicine with others. What if I miss a dose? This does not apply. What may interact with this medicine? -chemotherapy or radiation therapy -medicines that lower your immune system like etanercept, anakinra, infliximab, and adalimumab -medicines that treat or prevent blood clots like warfarin -phenytoin -steroid medicines like prednisone or cortisone -theophylline -vaccines This list may not describe all possible interactions. Give your health care provider a list of all the medicines, herbs, non-prescription drugs, or dietary supplements you use. Also tell them if you smoke, drink alcohol, or use illegal drugs. Some items may interact with your medicine. What should I watch for while using this medicine? Report any side effects that do not go away within 3 days to your doctor or health care professional. Call your health care provider if any unusual symptoms occur within 6 weeks of receiving this vaccine. You may still catch the flu, but the illness is not usually as bad. You cannot get the flu from the vaccine. The vaccine will not protect against colds or other illnesses that may cause fever. The vaccine is needed every year. What side effects may I notice from receiving this medicine? Side effects that you should report to your doctor or health care professional as soon as possible: -allergic reactions like skin rash, itching or hives, swelling of the face, lips, or tongue Side effects that usually do not require medical attention (report to your doctor or health care professional if they continue or are bothersome): -fever -headache -muscle aches and pains -pain, tenderness, redness, or swelling at the injection site -tiredness This list may not describe all possible side effects. Call your doctor for medical advice about side effects. You may report side effects to  FDA at 1-800-FDA-1088. Where should I keep my medicine? The vaccine will be given by a health care professional in a clinic, pharmacy, doctor's office, or other health care setting. You will not be given vaccine doses to store at home. NOTE: This sheet is a summary. It may not cover all possible information. If you have questions about this medicine, talk to your doctor, pharmacist, or health care provider.  2018 Elsevier/Gold Standard (2015-02-11 10:07:28)

## 2018-06-18 NOTE — Progress Notes (Signed)
Reviewed and agree with assessment/plan.   Bowman Higbie, MD [facility] 08/01/2016, 12:24 PM Pager:  [phone]  

## 2018-06-19 ENCOUNTER — Encounter: Payer: Self-pay | Admitting: Interventional Cardiology

## 2018-06-19 ENCOUNTER — Ambulatory Visit: Payer: Federal, State, Local not specified - PPO | Admitting: Interventional Cardiology

## 2018-06-19 VITALS — BP 124/88 | HR 73 | Ht 67.5 in | Wt 280.2 lb

## 2018-06-19 DIAGNOSIS — R072 Precordial pain: Secondary | ICD-10-CM

## 2018-06-19 DIAGNOSIS — R0609 Other forms of dyspnea: Secondary | ICD-10-CM | POA: Diagnosis not present

## 2018-06-19 DIAGNOSIS — I341 Nonrheumatic mitral (valve) prolapse: Secondary | ICD-10-CM

## 2018-06-19 DIAGNOSIS — Z01812 Encounter for preprocedural laboratory examination: Secondary | ICD-10-CM

## 2018-06-19 DIAGNOSIS — R0600 Dyspnea, unspecified: Secondary | ICD-10-CM

## 2018-06-19 NOTE — Progress Notes (Signed)
Cardiology Office Note   Date:  06/19/2018   ID:  Jill Harper, DOB 13-Jan-1966, MRN [id number]  PCP:  Binnie Rail, MD    No chief complaint on file.  Chest pain/shortness of breath  Wt Readings from Last 3 Encounters:  06/19/18 280 lb 3.2 oz (127.1 kg)  06/16/18 277 lb 6.4 oz (125.8 kg)  05/16/18 282 lb 9.6 oz (128.2 kg)       History of Present Illness: Jill Harper is a 52 y.o. female who is being seen today for the evaluation of chest pain and SHOB at the request of Chesley Mires, MD.  She has had several autoimmune diseases including RA and polymyositis.  She has a h/o MVP.  She was treated with prn atenolol for a "pinch" in her chest.  She may wake up and feel like she was running all night.  This was not problematic for some time.    She has had worsening DOE.  SHe has had some CP as well, occasionally associated with walking.   She has had some palpitations.  THese feel like "flips" in her heart that last a second.  She has ILD na\\and bronchiectasis as well.   Due to wrsening sx, she was reffered here.  She had an echo in 10/19 showing: Left ventricle: The cavity size was normal. There was moderate   concentric hypertrophy. Systolic function was normal. The   estimated ejection fraction was in the range of 60% to 65%. Wall   motion was normal; there were no regional wall motion   abnormalities. Doppler parameters are consistent with abnormal   left ventricular relaxation (grade 1 diastolic dysfunction).   There was no evidence of elevated ventricular filling pressure by   Doppler parameters. - Aortic valve: There was no regurgitation. - Mitral valve: There was mild regurgitation. - Left atrium: The atrium was normal in size. - Right ventricle: Systolic function was normal. - Right atrium: The atrium was normal in size. - Tricuspid valve: There was trivial regurgitation. - Pulmonic valve: There was no regurgitation. - Pulmonary arteries: Systolic pressure  was within the normal   range. - Inferior vena cava: The vessel was normal in size. - Pericardium, extracardiac: There was no pericardial effusion.  She has had a heart cath which was clean in 2002.  SHe was diagnosed with pericarditis.  Oldest brother had a heart surgery.  Another sibling had an MI, but was a smoker.  No one with stents.    Past Medical History:  Diagnosis Date  . Abnormal liver function test   . Bronchiectasis       . Fibromyalgia   . Menorrhagia   . MIGRAINE HEADACHE   . MVP (mitral valve prolapse)   . Polymyositis ([place])    Dr [name]; chronic MTX    Past Surgical History:  Procedure Laterality Date  . CARDIAC CATHETERIZATION  2002   normal  . DILATION AND CURETTAGE OF UTERUS  08-31-08   Dr [name]     Current Outpatient Medications  Medication Sig Dispense Refill  . albuterol (PROVENTIL HFA) 108 (90 Base) MCG/ACT inhaler Inhale 2 puffs into the lungs 4 (four) times daily as needed. Reported on 09/03/2015 1 Inhaler 4  . albuterol (PROVENTIL) (2.5 MG/3ML) 0.083% nebulizer solution Take 3 mLs (2.5 mg total) by nebulization every 6 (six) hours as needed for wheezing or shortness of breath. 120 vial 5  . atenolol (TENORMIN) 25 MG tablet TAKE 1/2 TABLET BY  MOUTH DAILY AS NEEDED FOR SYMPTOMS OF MITRAL PROLAPSE 45 tablet 1  . azaTHIOprine (IMURAN) 50 MG tablet Take 1 tablet (50 mg total) by mouth daily. 30 tablet 2  . cyclobenzaprine (FLEXERIL) 10 MG tablet Take 5 mg by mouth at bedtime as needed. Reported on 09/03/2015    . diclofenac sodium (VOLTAREN) 1 % GEL Apply 2 g topically 4 (four) times daily as needed (pain). 300 g 1  . eletriptan (RELPAX) 20 MG tablet One tablet by mouth at onset of headache. May repeat in 2 hours if headache persists or recurs. 10 tablet 0  . fluticasone furoate-vilanterol (BREO ELLIPTA) 100-25 MCG/INH AEPB Inhale 1 puff into the lungs daily. 60 each 5  . folic acid (FOLVITE) 1 MG tablet Take 2 tablets (2 mg total) by  mouth daily. 180 tablet 3  . guaiFENesin (MUCINEX) 600 MG 12 hr tablet Take 2 tablets (1,200 mg total) by mouth 2 (two) times daily as needed for cough or to loosen phlegm.    [name] Respiratory Therapy Supplies (FLUTTER) DEVI Use as directed (Patient taking differently: 4 (four) times daily. Use as directed) 1 each 0  . traMADol (ULTRAM) 50 MG tablet Take 50 mg by mouth every 6 (six) hours as needed for moderate pain. Reported on 09/03/2015    . Vitamin D, Ergocalciferol, (DRISDOL) 50000 units CAPS capsule Take 1 capsule (50,000 Units total) by mouth every 30 (thirty) days. 3 capsule 1   No current facility-administered medications for this visit.     Allergies:   Sulfonamide derivatives    Social History:  The patient  reports that she has never smoked. She has never used smokeless tobacco. She reports that she does not drink alcohol or use drugs.   Family History:  The patient's family history includes Diabetes in her brother, mother, sister, and sister; Fibromyalgia in her mother; Heart attack in her sister; Heart disease in her brother; Hypertension in her sister and sister; Lupus in her sister; Multiple myeloma in her father and sister; Other in her sister; Rheum arthritis in her sister and sister; Ulcers in her mother.    ROS:  Please see the history of present illness.   Otherwise, review of systems are positive for DOE.   All other systems are reviewed and negative.    PHYSICAL EXAM: VS:  BP 124/88   Pulse 73   Ht 5' 7.5" (1.715 m)   Wt 280 lb 3.2 oz (127.1 kg)   SpO2 98%   BMI 43.24 kg/m  , BMI Body mass index is 43.24 kg/m. GEN: Well nourished, well developed, in no acute distress  HEENT: normal  Neck: no JVD, carotid bruits, or masses Cardiac: RRR; 2/6 systolic murmurs, no rubs, or gallops,no edema  Respiratory:  clear to auscultation bilaterally, normal work of breathing GI: soft, nontender, nondistended, + BS MS: no deformity or atrophy  Skin: warm and dry, no rash Neuro:   Strength and sensation are intact Psych: euthymic mood, full affect   EKG:   The ekg ordered today demonstrates NSR, no ST changes   Recent Labs: 03/18/2018: TSH 5.28 05/19/2018: ALT 19; BUN 9; Creat 0.71; Hemoglobin 14.2; Platelets 261; Potassium 4.4; Sodium 141   Lipid Panel    Component Value Date/Time   CHOL 110 04/24/2016 0900   TRIG 59.0 04/24/2016 0900   HDL 39.20 04/24/2016 0900   CHOLHDL 3 04/24/2016 0900   VLDL 11.8 04/24/2016 0900   LDLCALC 59 04/24/2016 0900     Other studies Reviewed:  Additional studies/ records that were reviewed today with results demonstrating: Prior CT result and echo result reviewed.   ASSESSMENT AND PLAN:  1. Chest pain: Some atypical features.  Plan for CTA coronaries to eval for reversible cause of DOE.  If negative for significant CAD, would focus more on deconditioning and pulmonary issues as the cause of her shortness of breath.  Echocardiogram did not show any significant cause of severe shortness of breath. 2. OK to use atenolol to slow HR down for CT scan. 3. HQU:IQNV mild MR.  I do not see signs of CHF.   4. Morbid Obesity: She would benefit from weight loss.  She does want to increase her exercise.  We talked about doing this gradually.   Current medicines are reviewed at length with the patient today.  The patient concerns regarding her medicines were addressed.  The following changes have been made:  No change  Labs/ tests ordered today include:  No orders of the defined types were placed in this encounter.   Recommend 150 minutes/week of aerobic exercise Low fat, low carb, high fiber diet recommended  Disposition:   FU based on results   Signed, Larae Grooms, MD  06/19/2018 2:21 PM    [facility] [address], [place], [place]  [address] Phone: [phone]; Fax: [phone]

## 2018-06-19 NOTE — Patient Instructions (Addendum)
Medication Instructions:  Your physician recommends that you continue on your current medications as directed. Please refer to the Current Medication list given to you today.  If you need a refill on your cardiac medications before your next appointment, please call your pharmacy.   Lab work: Your physician recommends that you return for lab work for BMET prior to Cardiac CT  If you have labs (blood work) drawn today and your tests are completely normal, you will receive your results only by: [name] MyChart Message (if you have MyChart) OR . A paper copy in the mail If you have any lab test that is abnormal or we need to change your treatment, we will call you to review the results.  Testing/Procedures: Your physician has requested that you have cardiac CT. Cardiac computed tomography (CT) is a painless test that uses an x-ray machine to take clear, detailed pictures of your heart. For further information please visit [email or web address]. Please follow instruction sheet as given.  Follow-Up: . Based on test results  Any Other Special Instructions Will Be Listed Below (If Applicable).  CARDIAC CT INSTRUCTIONS  Please arrive at the [facility] main entrance of [facility] at xx:xx AM (30-45 minutes prior to test start time)  [facility] [place], [place] [address] [phone]  Proceed to the [facility] Radiology Department (First Floor).  Please follow these instructions carefully (unless otherwise directed):  On the Night Before the Test: . Be sure to Drink plenty of water. . Do not consume any caffeinated/decaffeinated beverages or chocolate 12 hours prior to your test. . Do not take any antihistamines 12 hours prior to your test.   On the Day of the Test: . Drink plenty of water. Do not drink any water within one hour of the test. . Do not eat any food 4 hours prior to the test. . You may take your regular medications prior to the test.   . Take atenolol 50 mg two hours prior to test       After the Test: . Drink plenty of water. . After receiving IV contrast, you may experience a mild flushed feeling. This is normal. . On occasion, you may experience a mild rash up to 24 hours after the test. This is not dangerous. If this occurs, you can take Benadryl 25 mg and increase your fluid intake. . If you experience trouble breathing, this can be serious. If it is severe call 911 IMMEDIATELY. If it is mild, please call our office.

## 2018-06-26 ENCOUNTER — Other Ambulatory Visit: Payer: Self-pay

## 2018-06-26 DIAGNOSIS — Z79899 Other long term (current) drug therapy: Secondary | ICD-10-CM | POA: Diagnosis not present

## 2018-06-26 DIAGNOSIS — Z1231 Encounter for screening mammogram for malignant neoplasm of breast: Secondary | ICD-10-CM | POA: Diagnosis not present

## 2018-06-26 DIAGNOSIS — Z6841 Body Mass Index (BMI) 40.0 and over, adult: Secondary | ICD-10-CM | POA: Diagnosis not present

## 2018-06-26 DIAGNOSIS — Z01419 Encounter for gynecological examination (general) (routine) without abnormal findings: Secondary | ICD-10-CM | POA: Diagnosis not present

## 2018-06-26 LAB — HM PAP SMEAR: HM Pap smear: NEGATIVE

## 2018-06-26 LAB — HM MAMMOGRAPHY

## 2018-06-27 LAB — CBC WITH DIFFERENTIAL/PLATELET
BASOS ABS: 52 {cells}/uL (ref 0–200)
Basophils Relative: 0.6 %
EOS ABS: 215 {cells}/uL (ref 15–500)
Eosinophils Relative: 2.5 %
HEMATOCRIT: 41 % (ref 35.0–45.0)
HEMOGLOBIN: 13.5 g/dL (ref 11.7–15.5)
Lymphs Abs: 2055 cells/uL (ref 850–3900)
MCH: 27.4 pg (ref 27.0–33.0)
MCHC: 32.9 g/dL (ref 32.0–36.0)
MCV: 83.3 fL (ref 80.0–100.0)
MONOS PCT: 6.8 %
MPV: 11 fL (ref 7.5–12.5)
NEUTROS ABS: 5693 {cells}/uL (ref 1500–7800)
NEUTROS PCT: 66.2 %
Platelets: 287 10*3/uL (ref 140–400)
RBC: 4.92 10*6/uL (ref 3.80–5.10)
RDW: 12.5 % (ref 11.0–15.0)
Total Lymphocyte: 23.9 %
WBC mixed population: 585 cells/uL (ref 200–950)
WBC: 8.6 10*3/uL (ref 3.8–10.8)

## 2018-06-27 LAB — COMPLETE METABOLIC PANEL WITH GFR
AG Ratio: 1.1 (calc) (ref 1.0–2.5)
ALBUMIN MSPROF: 3.8 g/dL (ref 3.6–5.1)
ALT: 14 U/L (ref 6–29)
AST: 19 U/L (ref 10–35)
Alkaline phosphatase (APISO): 79 U/L (ref 33–130)
BILIRUBIN TOTAL: 0.3 mg/dL (ref 0.2–1.2)
BUN: 14 mg/dL (ref 7–25)
CO2: 27 mmol/L (ref 20–32)
CREATININE: 0.73 mg/dL (ref 0.50–1.05)
Calcium: 9.3 mg/dL (ref 8.6–10.4)
Chloride: 108 mmol/L (ref 98–110)
GFR, Est African American: 110 mL/min/{1.73_m2} (ref >=60)
GFR, Est Non African American: 95 mL/min/{1.73_m2} (ref >=60)
GLUCOSE: 91 mg/dL (ref 65–99)
Globulin: 3.4 g/dL (calc) (ref 1.9–3.7)
Potassium: 4.3 mmol/L (ref 3.5–5.3)
Sodium: 143 mmol/L (ref 135–146)
Total Protein: 7.2 g/dL (ref 6.1–8.1)

## 2018-06-27 NOTE — Progress Notes (Signed)
Question about Imuran dose.  Patient asked if she should increase to 3 tablets daily.  Prescription called in for 1 tablet daily and patient is currently taking 2 tablets daily.  Dose unclear in notes.  Instructed patient to continue taking 2 tablets  (100mg ) daily.

## 2018-07-03 IMAGING — DX DG CHEST 2V
2 series · 2 of 2 positions shown · non-contrast
Comparison: 09/21/2015.

CLINICAL DATA: Cough and fever for the past 10 days. History of
bronchiectasis.

EXAM:
CHEST  2 VIEW

[chest pa]
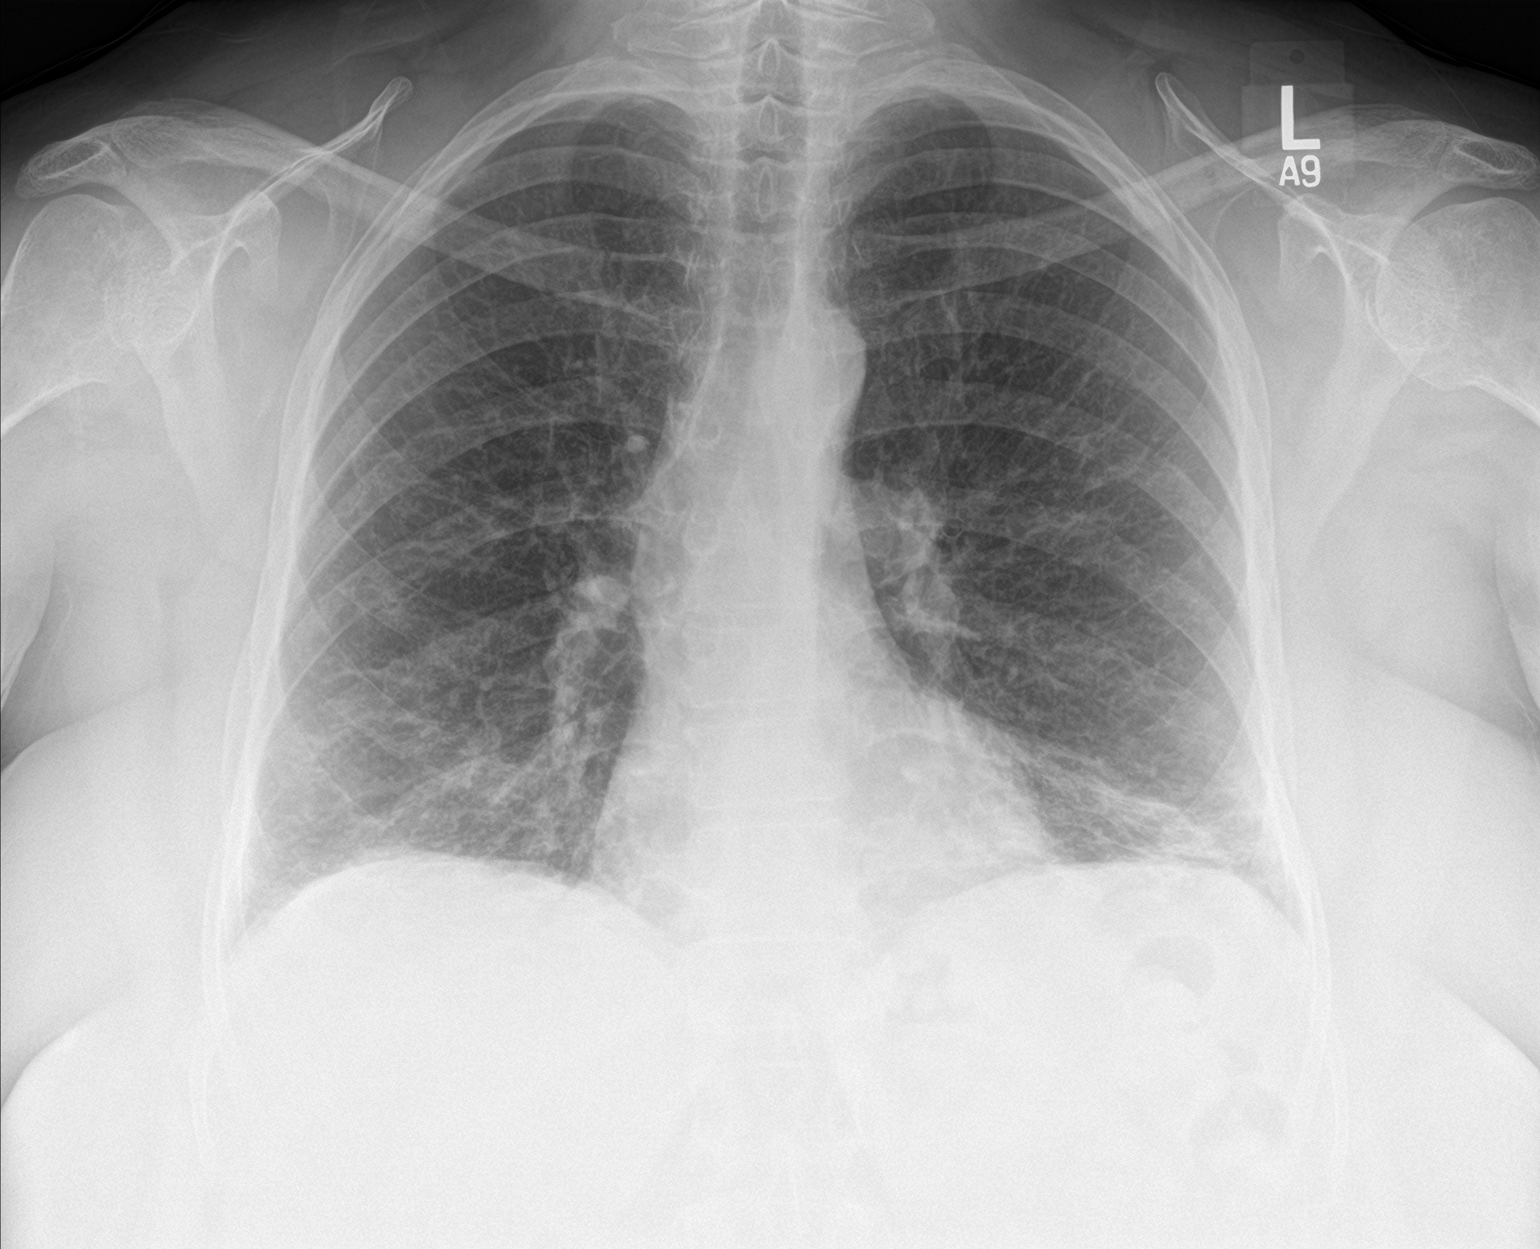

[chest lat]
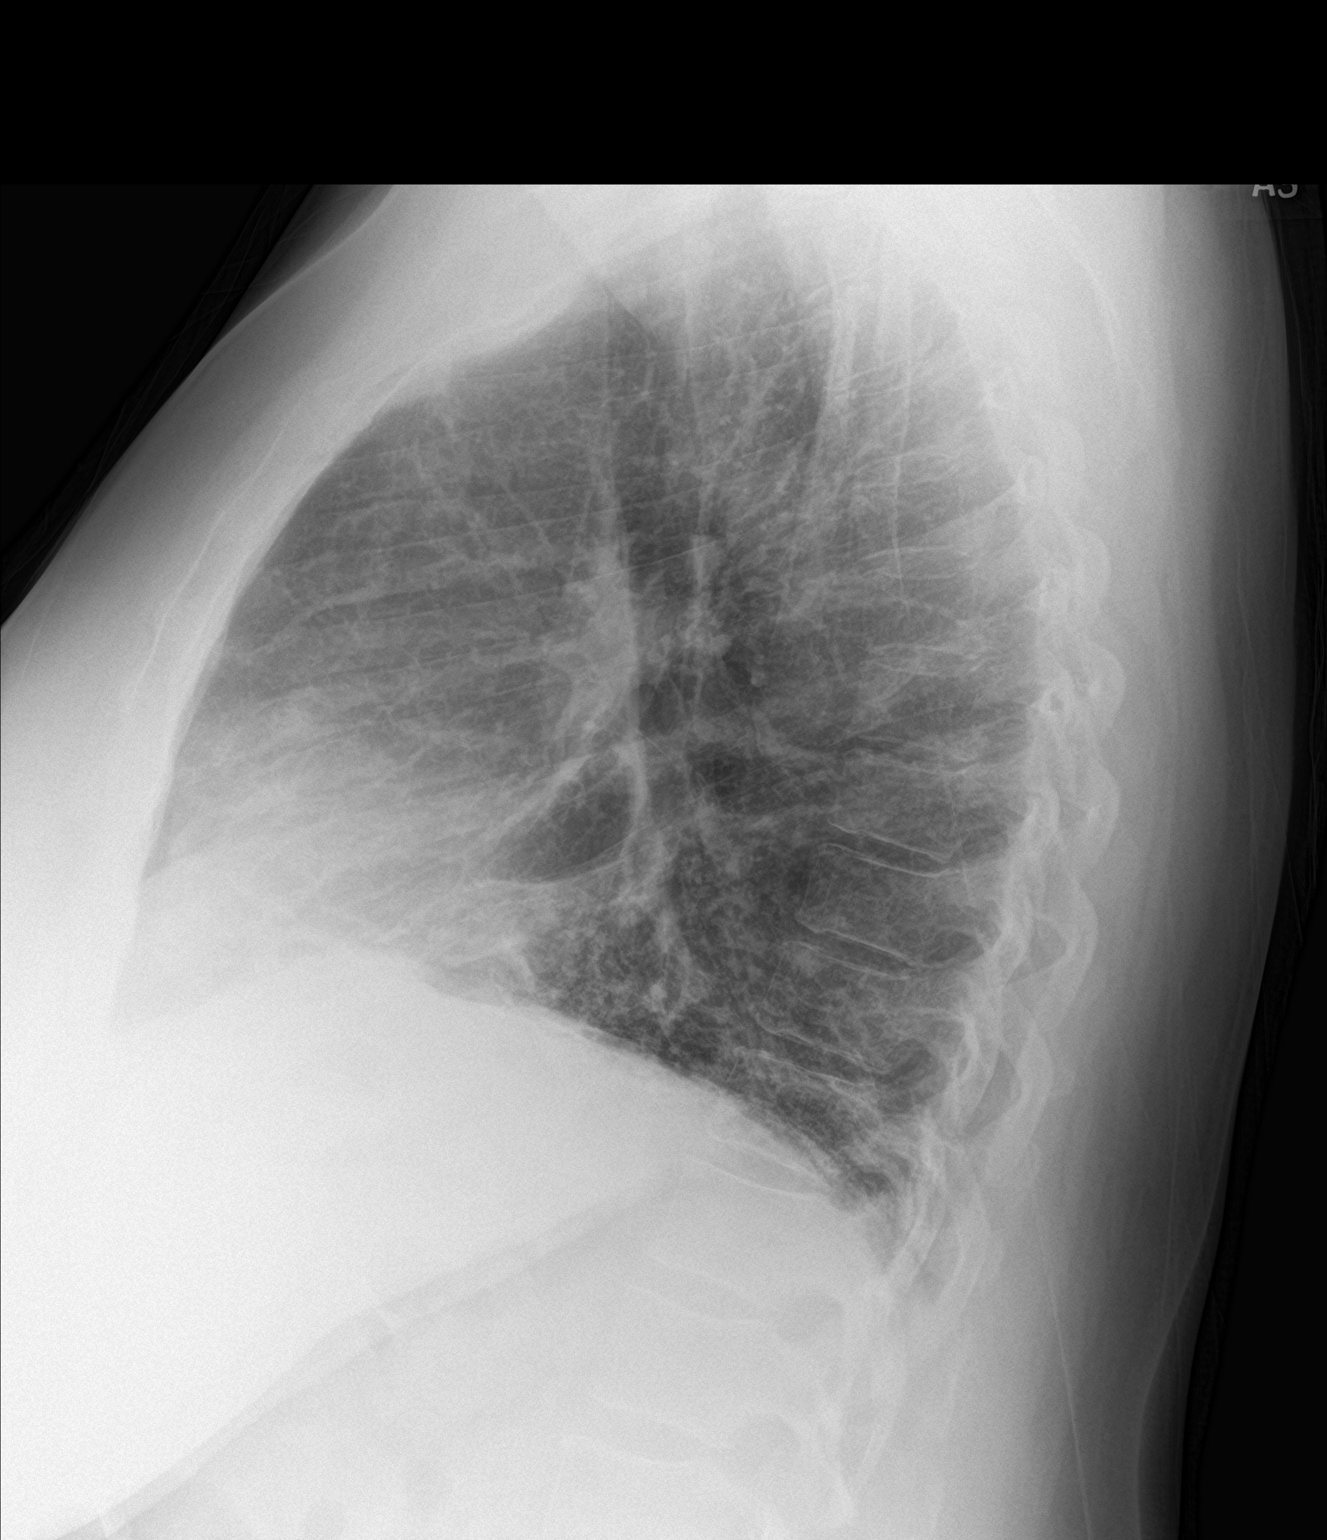

[2 of 2 positions shown; findings below may reference images not displayed]

FINDINGS: Normal sized heart. Mild increase in linear and patchy density at
the left lung base with interval minimal patchy density at the right
lung base. Stable diffuse prominence of the interstitial markings
with mild diffuse peribronchial thickening. Unremarkable bones.
IMPRESSION: 1. Interval mild patchy opacity at both lung bases, suspicious for
pneumonia.
2. Mildly progressive linear atelectasis or scarring at the left
lung base.
3. Stable chronic bronchitic changes and chronic interstitial lung
disease.

## 2018-07-08 ENCOUNTER — Other Ambulatory Visit: Payer: Self-pay | Admitting: Rheumatology

## 2018-07-08 MED ORDER — AZATHIOPRINE 50 MG PO TABS: 100.0000 mg | ORAL_TABLET | Freq: Every day | ORAL | 2 refills | Status: DC

## 2018-07-08 MED ORDER — AZATHIOPRINE 50 MG PO TABS: 50.0000 mg | ORAL_TABLET | Freq: Every day | ORAL | 2 refills | Status: DC

## 2018-07-08 NOTE — Addendum Note (Signed)
Addended by: Carole Binning on: 07/08/2018 01:32 PM   Modules accepted: Orders

## 2018-07-08 NOTE — Telephone Encounter (Signed)
Patient left voicemail requesting prescription refill of Imuran to be sent to [facility] on [address]

## 2018-07-08 NOTE — Telephone Encounter (Signed)
Last Visit: 04/24/18 Next Visit: 09/25/18 Labs: 06/26/18 WNL  Okay to refill per Dr.Deveshwar

## 2018-07-18 ENCOUNTER — Ambulatory Visit (HOSPITAL_COMMUNITY): Admission: RE | Admit: 2018-07-18 | Payer: Federal, State, Local not specified - PPO | Source: Ambulatory Visit

## 2018-07-18 ENCOUNTER — Encounter (HOSPITAL_COMMUNITY): Payer: Self-pay

## 2018-07-18 ENCOUNTER — Ambulatory Visit (HOSPITAL_COMMUNITY)
Admission: RE | Admit: 2018-07-18 | Discharge: 2018-07-18 | Disposition: A | Discharge status: Home / Self Care | Payer: Federal, State, Local not specified - PPO | Source: Ambulatory Visit | Attending: Interventional Cardiology | Admitting: Interventional Cardiology

## 2018-07-18 DIAGNOSIS — R072 Precordial pain: Secondary | ICD-10-CM | POA: Diagnosis not present

## 2018-07-18 DIAGNOSIS — Z006 Encounter for examination for normal comparison and control in clinical research program: Secondary | ICD-10-CM

## 2018-07-18 MED ORDER — NITROGLYCERIN 0.4 MG SL SUBL
SUBLINGUAL_TABLET | SUBLINGUAL | Status: AC
  Filled 2018-07-18: qty 2

## 2018-07-18 MED ORDER — IOPAMIDOL (ISOVUE-370) INJECTION 76%
80.0000 mL | Freq: Once | INTRAVENOUS | Status: AC | PRN
  Administered 2018-07-18: 80 mL via INTRAVENOUS

## 2018-07-18 MED ORDER — NITROGLYCERIN 0.4 MG SL SUBL
0.8000 mg | SUBLINGUAL_TABLET | Freq: Once | SUBLINGUAL | Status: AC
  Administered 2018-07-18: 0.8 mg via SUBLINGUAL
  Filled 2018-07-18: qty 25

## 2018-07-18 NOTE — Research (Signed)
Jill Harper met inclusion and exclusion criteria.  The informed consent form, study requirements and expectations were reviewed with the subject and questions and concerns were addressed prior to the signing of the consent form.  The subject verbalized understanding of the trial requirements.  The subject agreed to participate in the CADFEM trial and signed the informed consent.  The informed consent was obtained prior to performance of any protocol-specific procedures for the subject.  A copy of the signed informed consent was given to the subject and a copy was placed in the subject's medical record.   [place] Assistant

## 2018-07-21 ENCOUNTER — Telehealth: Payer: Self-pay | Admitting: Pulmonary Disease

## 2018-07-21 ENCOUNTER — Telehealth: Payer: Self-pay

## 2018-07-21 MED ORDER — AZITHROMYCIN 250 MG PO TABS: ORAL_TABLET | ORAL | 0 refills | Status: DC

## 2018-07-21 NOTE — Telephone Encounter (Signed)
Can send script for Zpak.

## 2018-07-21 NOTE — Telephone Encounter (Signed)
Called and spoke with pt letting her know we were sending zpak abx to her pharmacy for her. Pt expressed understanding. Rx sent to pt's preferred pharmacy. Nothing further needed.

## 2018-07-21 NOTE — Telephone Encounter (Signed)
-----   Message from Jettie Booze, MD sent at 07/18/2018  5:50 PM EST ----- No CAD.  Noncardiac chest pain.  Mention of fibrotic interstitial lung disease.  F/u with PMD.

## 2018-07-21 NOTE — Telephone Encounter (Signed)
Called and spoke with pt who stated she has had a cough x1 week now but states her cough has changed from her usual cough to a more "loose-sounding" cough. Pt is coughing up thick mucus which has changed to a pale yellow to now dark yellow-tan in color.  Pt denies any fever. Pt states she does have some increased SOB due to the intense coughing.  Pt does have an appt scheduled next Monday with [name] but due to her cough becoming worse, she would like something to be prescribed. [name], please advise on this for pt. Thanks!

## 2018-07-21 NOTE — Telephone Encounter (Signed)
Left message for patient to call back  

## 2018-07-22 ENCOUNTER — Ambulatory Visit: Payer: Federal, State, Local not specified - PPO | Admitting: Physician Assistant

## 2018-07-22 NOTE — Telephone Encounter (Signed)
The patient has been notified of the result and verbalized understanding. Patient sees [name] for her ILD. Patient sent a MyChart message regarding being marked for no show for her CT. Made patient aware that since her CT scan was normal they did not have to send off for FFR and therefore marked it as a no show. Made patient aware that it will not count against her. All questions (if any) were answered.

## 2018-07-22 NOTE — Telephone Encounter (Signed)
See telephone encounter 07/21/18

## 2018-07-28 ENCOUNTER — Ambulatory Visit (INDEPENDENT_AMBULATORY_CARE_PROVIDER_SITE_OTHER): Payer: Federal, State, Local not specified - PPO | Admitting: *Deleted

## 2018-07-28 ENCOUNTER — Encounter: Payer: Self-pay | Admitting: Pulmonary Disease

## 2018-07-28 ENCOUNTER — Ambulatory Visit: Payer: Federal, State, Local not specified - PPO | Admitting: Pulmonary Disease

## 2018-07-28 VITALS — BP 108/56 | HR 73 | Ht 68.0 in | Wt 282.0 lb

## 2018-07-28 DIAGNOSIS — G4733 Obstructive sleep apnea (adult) (pediatric): Secondary | ICD-10-CM | POA: Diagnosis not present

## 2018-07-28 DIAGNOSIS — J849 Interstitial pulmonary disease, unspecified: Secondary | ICD-10-CM

## 2018-07-28 DIAGNOSIS — M069 Rheumatoid arthritis, unspecified: Secondary | ICD-10-CM | POA: Diagnosis not present

## 2018-07-28 DIAGNOSIS — J479 Bronchiectasis, uncomplicated: Secondary | ICD-10-CM | POA: Diagnosis not present

## 2018-07-28 DIAGNOSIS — R0609 Other forms of dyspnea: Secondary | ICD-10-CM

## 2018-07-28 DIAGNOSIS — R29818 Other symptoms and signs involving the nervous system: Secondary | ICD-10-CM

## 2018-07-28 DIAGNOSIS — M3320 Polymyositis, organ involvement unspecified: Secondary | ICD-10-CM | POA: Diagnosis not present

## 2018-07-28 DIAGNOSIS — R0600 Dyspnea, unspecified: Secondary | ICD-10-CM

## 2018-07-28 MED ORDER — BUDESONIDE-FORMOTEROL FUMARATE 80-4.5 MCG/ACT IN AERO: 2.0000 | INHALATION_SPRAY | Freq: Two times a day (BID) | RESPIRATORY_TRACT | 0 refills | Status: DC

## 2018-07-28 NOTE — Assessment & Plan Note (Signed)
    Bronchiectasis: This is the medical term which indicates that you have damage, dilated airways making you more susceptible to respiratory infection. Use a flutter valve 10 breaths twice a day or 4 to 5 breaths 4-5 times a day to help clear mucus out Let us know if you have cough with change in mucus color or fevers or chills.  At that point you would need an antibiotic. Maintain a healthy nutritious diet, eating whole foods Take your medications as prescribed   Follow-up in 4 to 6 weeks or sooner if symptoms or not improving

## 2018-07-28 NOTE — Assessment & Plan Note (Signed)
Continue Imuran Continue follow-up with rheumatology

## 2018-07-28 NOTE — Assessment & Plan Note (Signed)
Patient to take home sleep study equipment home today

## 2018-07-28 NOTE — Progress Notes (Signed)
@Patient  ID: Jill Harper, female    DOB: 12-Feb-1966, 52 y.o.   MRN: [id number]  Chief Complaint  Patient presents with  . Follow-up    6-minute walk, 6-week follow-up    Referring provider: Binnie Rail, MD  HPI:  52 year old female followed in our office for bronchiectasis in the setting of rheumatoid arthritis and polymyositis  PMH: Rheumatoid arthritis, polymyositis, maintained on Imuran per rheumatology Smoker/ Smoking History: Never smoker Maintenance: Breo Ellipta 100 Pt of: [name]  07/28/2018  - Visit   52 year old female patient presenting today for follow-up visit as well as 6-minute walk.  6-minute walk results listed below.  Patient was previously treated via telephone encounter and was prescribed a Z-Pak.  Patient reports that the cough and her sputum color has improved but since finishing the Z-Pak she does report the cough has slightly started to come back.  Sputum color is still tan.  Patient with baseline bronchiectasis and has been using her flutter valve.  Patient reports she has not completed a sleep study that was previously ordered last office visit.  Patient is not using any     Tests:  Chest imaging: CT chest 03/23/05 >> patchy b/l lower lung ASD with cylindrical BTX CT chest 02/17/08 >> peripheral and basilar predominant subpleural GGO CT chest 12/01/08 >> no change HRCT chest 09/07/15 >> scattered GGO 04/15/2018-CT chest high-res- spectrum of findings compatible with basilar predominant fibrotic interstitial lung disease with progression since 2017,  UIP  Pulmonarytests: PFT 10/30/11 >> FEV1 3.31 (114%), FEV1% 85, TLC 6.06 (108%), DLCO 77%, no BD PFT 11/18/12 >> FEV1 3.18 (111%), FEV1% 84, TLC 5.34 (95%), DLCO 67%, no BD PFT 12/02/15 >> FEV1 2.96 (108%), FEV1% 92, TLC 4.95 (86%), DLCO 65%, no BD  04/17/2018-spirometry with DLCO-DLCO 76%  Serology 01/31/17 >> ANA negative, RF 29, anti CCP 213, SCL 70 negative, SSA/SSB negative, ENA SM negative, Jo-1  positive Quantiferon gold 01/31/17 >> negative  HST >>>  Cardiac tests: Echo 05/29/17 >> EF 60 to 65%, mod LVH, grade 1 DD Echo 05/23/18 >> mod LVH, EF 60 to 65%, grade 1 DD, mild MR  Results of the Epworth flowsheet 06/16/2018  Sitting and reading 0  Watching TV 1  Sitting, inactive in a public place (e.g. a theatre or a meeting) 0  As a passenger in a car for an hour without a break 2  Lying down to rest in the afternoon when circumstances permit 3  Sitting and talking to someone 0  Sitting quietly after a lunch without alcohol 1  In a car, while stopped for a few minutes in traffic 0  Total score 7     SIX MIN WALK 07/28/2018 05/16/2018  Medications Breo 100 at 7am -  Supplimental Oxygen during Test? (L/min) No No  Laps 9 -  Partial Lap (in Meters) 0 -  Baseline BP (sitting) 118/70 -  Baseline Heartrate 83 -  Baseline Dyspnea (Borg Scale) 1 -  Baseline Fatigue (Borg Scale) 0.5 -  Baseline SPO2 95 -  BP (sitting) 130/80 -  Heartrate 84 -  Dyspnea (Borg Scale) 3 -  Fatigue (Borg Scale) 3 -  SPO2 96 -  BP (sitting) 122/76 -  Heartrate 79 -  SPO2 97 -  Interpretation Angina;Dizziness;Leg pain -  Distance Completed 306 -  Tech Comments: pt walked at a normal pace during walk denying any complaints during walk.  pt did occassionally cough during walk pt had to stop after two laps due  to SOB and feeling dizzy.kmw    FENO:  Lab Results  Component Value Date   NITRICOXIDE 9 12/11/2017    PFT: PFT Results Latest Ref Rng & Units 04/17/2018 12/02/2015  FVC-Pre L 2.74 3.40  FVC-Predicted Pre % 90 99  FVC-Post L - 3.24  FVC-Predicted Post % - 94  Pre FEV1/FVC % % 90 87  Post FEV1/FCV % % - 92  FEV1-Pre L 2.46 2.97  FEV1-Predicted Pre % 101 108  FEV1-Post L - 2.96  DLCO UNC% % 76 65  DLCO COR %Predicted % 98 79  TLC L - 4.95  TLC % Predicted % - 86  RV % Predicted % - 76    Imaging: Ct Coronary Morph W/cta Cor W/score W/ca W/cm &/or Wo/cm  Addendum Date:  07/18/2018   ADDENDUM REPORT: 07/18/2018 16:48 CLINICAL DATA:  52 year old female with h/o obesity and atypical chest pain. EXAM: Cardiac/Coronary  CT TECHNIQUE: The patient was scanned on a [employer]. FINDINGS: A 120 kV prospective scan was triggered in the descending thoracic aorta at 111 HU's. Axial non-contrast 3 mm slices were carried out through the heart. The data set was analyzed on a dedicated work station and scored using the Morrison. Gantry rotation speed was 250 msecs and collimation was .6 mm. No beta blockade and 0.8 mg of sl NTG was given. The 3D data set was reconstructed in 5% intervals of the 67-82 % of the R-R cycle. Diastolic phases were analyzed on a dedicated work station using MPR, MIP and VRT modes. The patient received 80 cc of contrast. Aorta:  Normal size.  No calcifications.  No dissection. Aortic Valve:  Trileaflet.  No calcifications. Coronary Arteries:  Normal coronary origin.  Left dominance. Left main is a large and long artery that gives rise to LAD and LCX arteries. There is no plaque in the left main. LAD is a large vessel that wraps around the apex and supplies majority of the PDA territory. There is no plaque. LCX is a large dominant artery that gives rise to two OM branches and a small PDA. PLA is not visualized. There is no plaque RCA is a very small non-dominant artery. Other findings: Normal pulmonary vein drainage into the left atrium. Normal let atrial appendage without a thrombus. Normal size of the pulmonary artery. IMPRESSION: 1. Coronary calcium score of 0. This was 0 percentile for age and sex matched control. 2. Normal coronary origin with left dominance. 3. This study quality is affected by patient's size, however there is no evidence of CAD. Electronically Signed   By: Ena Dawley   On: 07/18/2018 16:48   Result Date: 07/18/2018 EXAM: OVER-READ INTERPRETATION  CT CHEST The following report is an over-read performed by radiologist Dr.  Aletta Edouard of [facility], [place] on 07/18/2018. This over-read does not include interpretation of cardiac or coronary anatomy or pathology. The coronary CTA interpretation by the cardiologist is attached. COMPARISON:  CT of the chest on 04/15/2018 FINDINGS: Vascular: No incidental findings. Mediastinum/Nodes: Stable small nonenlarged mediastinal lymph nodes. Lungs/Pleura: Stable fibrotic interstitial lung disease, predominantly in the lower lung zones. No overt pulmonary edema, focal airspace disease, pneumothorax or pleural fluid. Upper Abdomen: Small hiatal hernia. Musculoskeletal: No chest wall mass or suspicious bone lesions identified. IMPRESSION: Stable fibrotic interstitial lung disease.  Small hiatal hernia. Electronically Signed: By: Aletta Edouard M.D. On: 07/18/2018 16:38      Specialty Problems      Pulmonary Problems   BRONCHIECTASIS  Dr Halford Chessman  CT sinus 09/07/2015 neg for acute dz.  HRCT 09/07/2015 >Basilar predominant peribronchovascular ground-glass is indicative of bronchopneumonia. . No definitive evidence of underlying interstitial lung disease.            Throat clearing   Cough   Wheezing   Bronchiectasis (HCC)   Dyspnea    PFT 12/02/15 >> FEV1 2.96 (108%), FEV1% 92, TLC 4.95 (86%), DLCO 65%, no BD  HRCT chest 09/07/15 >> scattered GGO      ILD (interstitial lung disease) ([place])    HRCT chest 09/07/15 >> scattered GGO 04/15/2018-CT chest high-res- spectrum of findings compatible with basilar predominant fibrotic interstitial lung disease with progression since 2017,  UIP  Pulmonarytests: PFT 10/30/11 >> FEV1 3.31 (114%), FEV1% 85, TLC 6.06 (108%), DLCO 77%, no BD PFT 11/18/12 >> FEV1 3.18 (111%), FEV1% 84, TLC 5.34 (95%), DLCO 67%, no BD PFT 12/02/15 >> FEV1 2.96 (108%), FEV1% 92, TLC 4.95 (86%), DLCO 65%, no BD 04/17/2018-spirometry with DLCO-DLCO 76%  Serology 01/31/17 >> ANA negative, RF 29, anti CCP 213, SCL 70 negative, SSA/SSB negative, ENA SM  negative, Jo-1 positive Quantiferon gold 01/31/17 >> negative          Allergies  Allergen Reactions  . Sulfonamide Derivatives     REACTION: hives    Immunization History  Administered Date(s) Administered  . Influenza Split 04/10/2011, 06/18/2012, 05/06/2013  . Influenza Whole 05/19/2008, 05/15/2009, 06/01/2010  . Influenza,inj,Quad PF,6+ Mos 04/20/2014, 05/26/2015, 05/31/2016, 04/05/2017, 06/16/2018  . Pneumococcal Conjugate-13 01/29/2017  . Pneumococcal Polysaccharide-23 08/07/2007, 06/16/2018  . Td 07/12/2009    Past Medical History:  Diagnosis Date  . Abnormal liver function test   . Bronchiectasis       . Fibromyalgia   . Menorrhagia   . MIGRAINE HEADACHE   . MVP (mitral valve prolapse)   . Polymyositis ([place])    Dr [name]; chronic MTX    Tobacco History: Social History   Tobacco Use  Smoking Status Never Smoker  Smokeless Tobacco Never Used  Tobacco Comment   Married, lives with spouse. 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 works at [name] in [employer] [place] & [employer]  Never smoker   Outpatient Encounter Medications as of 07/28/2018  Medication Sig  . albuterol (PROVENTIL HFA) 108 (90 Base) MCG/ACT inhaler Inhale 2 puffs into the lungs 4 (four) times daily as needed. Reported on 09/03/2015  . albuterol (PROVENTIL) (2.5 MG/3ML) 0.083% nebulizer solution Take 3 mLs (2.5 mg total) by nebulization every 6 (six) hours as needed for wheezing or shortness of breath.  [name] atenolol (TENORMIN) 25 MG tablet TAKE 1/2 TABLET BY MOUTH DAILY AS NEEDED FOR SYMPTOMS OF MITRAL PROLAPSE  . azaTHIOprine (IMURAN) 50 MG tablet Take 2 tablets (100 mg total) by mouth daily.  . cyclobenzaprine (FLEXERIL) 10 MG tablet Take 5 mg by mouth at bedtime as needed. Reported on 09/03/2015  . diclofenac sodium (VOLTAREN) 1 % GEL Apply 2 g topically 4 (four) times  daily as needed (pain).  [name] eletriptan (RELPAX) 20 MG tablet One tablet by mouth at onset of headache. May repeat in 2 hours if headache persists or recurs.  . fluticasone furoate-vilanterol (BREO ELLIPTA) 100-25 MCG/INH AEPB Inhale 1 puff into the lungs daily.  . folic acid (FOLVITE) 1 MG tablet Take 2 tablets (2 mg total) by mouth daily.  [name] guaiFENesin (MUCINEX) 600 MG 12 hr tablet Take  2 tablets (1,200 mg total) by mouth 2 (two) times daily as needed for cough or to loosen phlegm.  [name] Respiratory Therapy Supplies (FLUTTER) DEVI Use as directed (Patient taking differently: 4 (four) times daily. Use as directed)  . traMADol (ULTRAM) 50 MG tablet Take 50 mg by mouth every 6 (six) hours as needed for moderate pain. Reported on 09/03/2015  . Vitamin D, Ergocalciferol, (DRISDOL) 50000 units CAPS capsule Take 1 capsule (50,000 Units total) by mouth every 30 (thirty) days.  . budesonide-formoterol (SYMBICORT) 80-4.5 MCG/ACT inhaler Inhale 2 puffs into the lungs 2 (two) times daily.  . [DISCONTINUED] azithromycin (ZITHROMAX) 250 MG tablet Take two today and then one daily until finished. (Patient not taking: Reported on 07/28/2018)   No facility-administered encounter medications on file as of 07/28/2018.      Review of Systems  Review of Systems  Constitutional: Positive for fatigue. Negative for chills, fever and unexpected weight change.  HENT: Negative for congestion, ear pain, postnasal drip, sinus pressure and sinus pain.   Respiratory: Positive for cough, shortness of breath and wheezing (all the time ). Negative for chest tightness.   Cardiovascular: Negative for chest pain and palpitations.  Gastrointestinal: Negative for blood in stool, diarrhea, nausea and vomiting.  Genitourinary: Negative for dysuria, frequency and urgency.  Musculoskeletal: Negative for arthralgias.       +leg pain bilaterally   Skin: Negative for color change.  Allergic/Immunologic: Negative for environmental  allergies and food allergies.  Neurological: Positive for weakness (legs bilaterally ) and headaches (Specifically when waking up, as well as with physical exertion). Negative for dizziness and light-headedness.  Psychiatric/Behavioral: Positive for confusion. Negative for dysphoric mood. The patient is not nervous/anxious.   All other systems reviewed and are negative.    Physical Exam  BP (!) 108/56 (BP Location: Left Wrist, Cuff Size: Normal)   Pulse 73   Ht 5\' 8"  (1.727 m)   Wt 282 lb (127.9 kg)   SpO2 95%   BMI 42.88 kg/m   Wt Readings from Last 5 Encounters:  07/28/18 282 lb (127.9 kg)  06/19/18 280 lb 3.2 oz (127.1 kg)  06/16/18 277 lb 6.4 oz (125.8 kg)  05/16/18 282 lb 9.6 oz (128.2 kg)  04/24/18 278 lb 9.6 oz (126.4 kg)    Physical Exam  Constitutional: She is oriented to person, place, and time and well-developed, well-nourished, and in no distress. No distress.  HENT:  Head: Normocephalic and atraumatic.  Right Ear: Hearing, tympanic membrane, external ear and ear canal normal.  Left Ear: Hearing, tympanic membrane, external ear and ear canal normal.  Nose: Nose normal. Right sinus exhibits no maxillary sinus tenderness and no frontal sinus tenderness. Left sinus exhibits no maxillary sinus tenderness and no frontal sinus tenderness.  Mouth/Throat: Uvula is midline and oropharynx is clear and moist. No oropharyngeal exudate.  Eyes: Pupils are equal, round, and reactive to light.  Neck: Normal range of motion. Neck supple.  Cardiovascular: Normal rate, regular rhythm and normal heart sounds.  Pulmonary/Chest: Effort normal. No accessory muscle usage. No respiratory distress. She has no decreased breath sounds. She has no wheezes. She has no rhonchi. She has rales (Bibasilar crackles).  Musculoskeletal: Normal range of motion.        General: No edema.  Lymphadenopathy:    She has no cervical adenopathy.  Neurological: She is alert and oriented to person, place, and  time. She has intact cranial nerves. She displays no weakness and facial symmetry. Gait normal. Coordination and  gait normal. GCS score is 15.  Skin: Skin is warm and dry. She is not diaphoretic. No erythema.  Psychiatric: Mood, memory, affect and judgment normal.  Nursing note and vitals reviewed.     Lab Results:  CBC    Component Value Date/Time   WBC 8.6 06/26/2018 1538   RBC 4.92 06/26/2018 1538   HGB 13.5 06/26/2018 1538   HCT 41.0 06/26/2018 1538   PLT 287 06/26/2018 1538   MCV 83.3 06/26/2018 1538   MCH 27.4 06/26/2018 1538   MCHC 32.9 06/26/2018 1538   RDW 12.5 06/26/2018 1538   LYMPHSABS 2,055 06/26/2018 1538   MONOABS 666 01/31/2017 1006   EOSABS 215 06/26/2018 1538   BASOSABS 52 06/26/2018 1538    BMET    Component Value Date/Time   NA 143 06/26/2018 1538   K 4.3 06/26/2018 1538   CL 108 06/26/2018 1538   CO2 27 06/26/2018 1538   GLUCOSE 91 06/26/2018 1538   BUN 14 06/26/2018 1538   CREATININE 0.73 06/26/2018 1538   CALCIUM 9.3 06/26/2018 1538   GFRNONAA 95 06/26/2018 1538   GFRAA 110 06/26/2018 1538    BNP No results found for: BNP  ProBNP    Component Value Date/Time   PROBNP 58.0 04/18/2009 1157      Assessment & Plan:   Pleasant 52 year old female complaining follow-up with our office today.  Patient to do home sleep study tonight.  Patient with occasional headaches with exertion as well as at night.  Bronchitis symptoms do seem to have resolved slightly with Z-Pak and appear to be improving.  If symptoms continue to worsen we could consider additional antibiotics.  Patient would like to hold off on steroids at this time as she says they make her "unable to sleep and kind of crazy".  I suspect the patient does not have a deep enough inspiratory pull for her Breo 100 we can trial patient on Symbicort 80 to see if this helps with her symptoms.  Will refer patient to pulmonary rehab as she is deconditioned and continues have shortness of breath for  interstitial lung disease.  I will discuss the patient's case with [name] as I believe the patient may benefit from either a neurology consult for the headaches or an ischemic work-up by cardiology.  Cardiology's note does clearly state that they do not believe this is a cardiology issue and believe we should manage the pulmonary needs.  As CTA was negative.  Bronchiectasis ([place])    Bronchiectasis: This is the medical term which indicates that you have damage, dilated airways making you more susceptible to respiratory infection. Use a flutter valve 10 breaths twice a day or 4 to 5 breaths 4-5 times a day to help clear mucus out Let us know if you have cough with change in mucus color or fevers or chills.  At that point you would need an antibiotic. Maintain a healthy nutritious diet, eating whole foods Take your medications as prescribed   Follow-up in 4 to 6 weeks or sooner if symptoms or not improving  ILD (interstitial lung disease) (HCC)  Trial of Symbicort 80 >>> 2 puffs in the morning right when you wake up, rinse out your mouth after use, 12 hours later 2 puffs, rinse after use >>> Take this daily, no matter what >>> This is not a rescue inhaler   Hold Breo 100 when using Symbicort 80  Referral to pulmonary rehab for interstitial lung disease  I will discuss your case  with [name] >>>We may need to consider either ischemic work-up by cardiology or neurology referral based off of your symptoms  Continue follow-up with rheumatology Follow-up in 4 to 6 weeks or sooner if symptoms or not improving  Rheumatoid arthritis ([place]) Continue Imuran Continue follow-up with rheumatology  Polymyositis ([place]) Continue Imuran Continue follow-up with rheumatology  Dyspnea Trial of Symbicort 80 >>> 2 puffs in the morning right when you wake up, rinse out your mouth after use, 12 hours later 2 puffs, rinse after use >>> Take this daily, no matter what >>> This is not a rescue inhaler    Hold Breo 100 when using Symbicort 80  Only use your albuterol as a rescue medication to be used if you can't catch your breath by resting or doing a relaxed purse lip breathing pattern.  - The less you use it, the better it will work when you need it. - Ok to use up to 2 puffs  every 4 hours if you must but call for immediate appointment if use goes up over your usual need - Don't leave home without it !!  (think of it like the spare tire for your car)   Referral to pulmonary rehab for interstitial lung disease  I will discuss your case with [name] >>>We may need to consider either ischemic work-up by cardiology or neurology referral based off of your symptoms  Continue follow-up with rheumatology Continue follow-up with primary care  Follow-up in 4 to 6 weeks or sooner if symptoms or not improving  Suspected sleep apnea Patient to take home sleep study equipment home today     Lauraine Rinne, NP 07/28/2018   This appointment was 43 min long with over 50% of the time in direct face-to-face patient care, assessment, plan of care, and follow-up.

## 2018-07-28 NOTE — Assessment & Plan Note (Signed)
Trial of Symbicort 80 >>> 2 puffs in the morning right when you wake up, rinse out your mouth after use, 12 hours later 2 puffs, rinse after use >>> Take this daily, no matter what >>> This is not a rescue inhaler   Hold Breo 100 when using Symbicort 80  Only use your albuterol as a rescue medication to be used if you can't catch your breath by resting or doing a relaxed purse lip breathing pattern.  - The less you use it, the better it will work when you need it. - Ok to use up to 2 puffs  every 4 hours if you must but call for immediate appointment if use goes up over your usual need - Don't leave home without it !!  (think of it like the spare tire for your car)   Referral to pulmonary rehab for interstitial lung disease  I will discuss your case with [name] >>>We may need to consider either ischemic work-up by cardiology or neurology referral based off of your symptoms  Continue follow-up with rheumatology Continue follow-up with primary care  Follow-up in 4 to 6 weeks or sooner if symptoms or not improving

## 2018-07-28 NOTE — Patient Instructions (Addendum)
Trial of Symbicort 80 >>> 2 puffs in the morning right when you wake up, rinse out your mouth after use, 12 hours later 2 puffs, rinse after use >>> Take this daily, no matter what >>> This is not a rescue inhaler   Hold Breo 100 when using Symbicort 80  Only use your albuterol as a rescue medication to be used if you can't catch your breath by resting or doing a relaxed purse lip breathing pattern.  - The less you use it, the better it will work when you need it. - Ok to use up to 2 puffs  every 4 hours if you must but call for immediate appointment if use goes up over your usual need - Don't leave home without it !!  (think of it like the spare tire for your car)     Bronchiectasis: This is the medical term which indicates that you have damage, dilated airways making you more susceptible to respiratory infection. Use a flutter valve 10 breaths twice a day or 4 to 5 breaths 4-5 times a day to help clear mucus out Let Jill Harper know if you have cough with change in mucus color or fevers or chills.  At that point you would need an antibiotic. Maintain a healthy nutritious diet, eating whole foods Take your medications as prescribed   Referral to pulmonary rehab for interstitial lung disease  We will ensure you get scheduled today for home sleep study   I will discuss your case with [name] >>>We may need to consider either ischemic work-up by cardiology or neurology referral based off of your symptoms  Continue follow-up with rheumatology Continue follow-up with primary care  Follow-up in 4 to 6 weeks or sooner if symptoms or not improving   It is flu season:   >>>Remember to be washing your hands regularly, using hand sanitizer, be careful to use around herself with has contact with people who are sick will increase her chances of getting sick yourself. >>> Best ways to protect herself from the flu: Receive the yearly flu vaccine, practice good hand hygiene washing with soap and also  using hand sanitizer when available, eat a nutritious meals, get adequate rest, hydrate appropriately   Please contact the office if your symptoms worsen or you have concerns that you are not improving.   Thank you for choosing [facility] for your healthcare, and for allowing Jill Harper to partner with you on your healthcare journey. I am thankful to be able to provide care to you today.   [name] FNP-C

## 2018-07-28 NOTE — Assessment & Plan Note (Addendum)
Trial of Symbicort 80 >>> 2 puffs in the morning right when you wake up, rinse out your mouth after use, 12 hours later 2 puffs, rinse after use >>> Take this daily, no matter what >>> This is not a rescue inhaler   Hold Breo 100 when using Symbicort 80  Referral to pulmonary rehab for interstitial lung disease  I will discuss your case with [name] >>>We may need to consider either ischemic work-up by cardiology or neurology referral based off of your symptoms  Continue follow-up with rheumatology Follow-up in 4 to 6 weeks or sooner if symptoms or not improving

## 2018-07-28 NOTE — Progress Notes (Signed)
SIX MIN WALK 07/28/2018 05/16/2018  Medications Breo 100 at 7am -  Supplimental Oxygen during Test? (L/min) No No  Laps 9 -  Partial Lap (in Meters) 0 -  Baseline BP (sitting) 118/70 -  Baseline Heartrate 83 -  Baseline Dyspnea (Borg Scale) 1 -  Baseline Fatigue (Borg Scale) 0.5 -  Baseline SPO2 95 -  BP (sitting) 130/80 -  Heartrate 84 -  Dyspnea (Borg Scale) 3 -  Fatigue (Borg Scale) 3 -  SPO2 96 -  BP (sitting) 122/76 -  Heartrate 79 -  SPO2 97 -  Interpretation Angina;Dizziness;Leg pain -  Distance Completed 306 -  Tech Comments: pt walked at a normal pace during walk denying any complaints during walk.  pt did occassionally cough during walk pt had to stop after two laps due to SOB and feeling dizzy.kmw

## 2018-07-29 ENCOUNTER — Telehealth: Payer: Self-pay | Admitting: Pulmonary Disease

## 2018-07-29 ENCOUNTER — Telehealth (HOSPITAL_COMMUNITY): Payer: Self-pay

## 2018-07-29 DIAGNOSIS — R519 Headache, unspecified: Secondary | ICD-10-CM

## 2018-07-29 DIAGNOSIS — R51 Headache: Principal | ICD-10-CM

## 2018-07-29 NOTE — Telephone Encounter (Signed)
Referral received from [facility] for Pulmonary Rehab with diagnosis of ILD. Clinical review of pt follow up appt on 12/23 Pulmonary office note with [facility] NP.   Pt appropriate for scheduling for Pulmonary rehab.  Will forward to support staff for scheduling and verification of insurance eligibility/benefits with pt  consent. Joycelyn Man, RN, BSN Cardiac and Pulmonary Rehab Nurse

## 2018-07-29 NOTE — Progress Notes (Signed)
Reviewed and agree with assessment/plan.   Eriq Hufford, MD [facility] 08/01/2016, 12:24 PM Pager:  [phone]  

## 2018-07-29 NOTE — Telephone Encounter (Signed)
07/29/18 1121  Was able to reach the patient to discuss plan of care after last office visit.  Will refer patient to neurology for further evaluation of headaches.  I do believe the patient may benefit from an ischemic cardiac work-up. May benefit from [facility]. Patient to follow-up with cardiology regarding persistent exertional headaches.  [name] FNP

## 2018-07-31 NOTE — Telephone Encounter (Signed)
Noted. Thanks for the quick follow up. With the exertional headaches will refer to neurology.   [name] FNP

## 2018-07-31 NOTE — Telephone Encounter (Signed)
She had a CTA of the coronaries on 12/13 showing no plaque, calcium score of 0.  No need for any other cardiac ischemia w/u.  From a cardiac standpoint, she just needs weight loss and other risk factor modification.

## 2018-08-04 ENCOUNTER — Other Ambulatory Visit: Payer: Self-pay | Admitting: *Deleted

## 2018-08-04 DIAGNOSIS — J849 Interstitial pulmonary disease, unspecified: Secondary | ICD-10-CM

## 2018-08-12 ENCOUNTER — Telehealth: Payer: Self-pay | Admitting: Pulmonary Disease

## 2018-08-12 ENCOUNTER — Ambulatory Visit: Payer: Federal, State, Local not specified - PPO | Admitting: Neurology

## 2018-08-12 ENCOUNTER — Encounter: Payer: Self-pay | Admitting: Neurology

## 2018-08-12 VITALS — BP 132/78 | HR 74 | Ht 68.0 in | Wt 279.0 lb

## 2018-08-12 DIAGNOSIS — G4484 Primary exertional headache: Secondary | ICD-10-CM

## 2018-08-12 DIAGNOSIS — G4483 Primary cough headache: Secondary | ICD-10-CM | POA: Diagnosis not present

## 2018-08-12 DIAGNOSIS — R51 Headache with orthostatic component, not elsewhere classified: Secondary | ICD-10-CM

## 2018-08-12 DIAGNOSIS — R519 Headache, unspecified: Secondary | ICD-10-CM

## 2018-08-12 DIAGNOSIS — G4733 Obstructive sleep apnea (adult) (pediatric): Secondary | ICD-10-CM | POA: Diagnosis not present

## 2018-08-12 NOTE — Progress Notes (Signed)
[facility]    Provider:  Dr Jaynee Eagles Referring Provider: Binnie Rail, MD, Lauraine Rinne NP Primary Care Physician:  Binnie Rail, MD Lauraine Rinne NP  CC:  Exertional headache  HPI:  Jill Harper is a 53 y.o. female here as requested by Dr. Quay Burow for headaches. PMHx RA, bronchiectasis, polymyositis on imuran follows with Rheumatology, never smoked, interstitial lung disease, fibromyalgia, migraines, mitral valve prolapse, abnormal liver function test. She was supposed to have a sleep study but did not complete that (per notes [name]).  She reports she has headaches that come on during exertion and dissipate without medication when she slows down or alters activities. These are different from her usual migraines. She just had sleep test a few weeks ago, snoring, stops breathing. She does not have the results. She wakes up at night feeling like she can't breathe. She wakes with with "new" headache in the setting of sleep problems for at least 5-6 months. She may have the same headache later in the day. The headache is a fogginedd across  the forehead and more on the left.She wakes with this headache in the setting if snoring and lung problems. The headache is worse with exertion, walking and talking, worse with valsalva and also with sex/orgasm. Rest helps and sitting still and it dissipates slowly. Can be moderately severe. Here with husband who provides much information.  Headache medications used: Atenolol, Flexeril, Relpax,  Reviewed notes, labs and imaging from outside physicians, which showed:  Personally reviewed images from 2013 and agree with the following   Reviewed [name] notes.  Last time she was seen was 07/28/2018.  Patient has a history of rheumatoid arthritis, polymyositis followed in rheumatology.  Also bronchiectasis.  She was supposed to have a sleep study that was previously ordered but she did not.  Epworth Sleepiness Scale 7 06/16/2018.  ANA  negative, rheumatoid factor XX 9, anti-CCP 213, SCL 70, SSA SSB negative, Jo 1+.  Patient's last echocardiogram 05/29/2017 with ejection fraction 60 to 65%, moderate left ventricular hypertrophy, grade 1 DD.  CT of the sinuses were negative for acute disease.   Called Dr. Juanetta Gosling office and requested results of sleep testing  Review of Systems: Patient complains of symptoms per HPI as well as the following symptoms: Headache, snoring, fatigue, shortness of breath, cough, wheezing, snoring, murmur, joint pain, joint swelling, cramps, aching muscles. Pertinent negatives and positives per HPI. All others negative.   Social History   Socioeconomic History  . Marital status: Married    Spouse name: Not on file  . Number of children: 1  . Years of education: Not on file  . Highest education level: High school graduate  Occupational History  . 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 Financial resource strain: Not on file  . Food insecurity:    Worry: Not on file    Inability: Not on file  . Transportation needs:    Medical: Not on file    Non-medical: Not on file  Tobacco Use  . Smoking status: Never Smoker  . Smokeless tobacco: Never Used  . Tobacco comment: Married, lives with spouse. 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 Alcohol use: Never    Frequency: Never    Comment: maybe a wine cooler once every other year  . Drug use: Never  . Sexual activity:  Yes    Birth control/protection: Surgical  Lifestyle  . Physical activity:    Days per week: Not on file    Minutes per session: Not on file  . Stress: Not on file  Relationships  . Social connections:    Talks on phone: Not on file    Gets together: Not on file    Attends religious service: Not on file    Active member of club or organization: Not on file    Attends meetings of clubs or organizations: Not on  file    Relationship status: Not on file  . Intimate partner violence:    Fear of current or ex partner: Not on file    Emotionally abused: Not on file    Physically abused: Not on file    Forced sexual activity: Not on file  Other Topics Concern  . Not on file  Social History Narrative   Exercise: trying to walk - limited by fatigue   Lives at home with husband and daughter   Right handed   Caffeine: occasional coke 1-2x per month    Family History  Problem Relation Age of Onset  . Diabetes Mother   . Fibromyalgia Mother   . Ulcers Mother   . Heart failure Mother   . Multiple myeloma Father   . Lupus Sister   . Other Sister        abdominal adhesions resulting in bowel obstruction  . Migraines Sister   . Heart disease Brother        bypass surgery  . Rheum arthritis Sister   . Multiple myeloma Sister   . Hypertension Sister   . Heart attack Sister   . Diabetes Sister   . Hypertension Sister   . Rheum arthritis Sister   . Diabetes Sister   . Diabetes Brother   . Headache Other        siblings with headaches but not diagnosed as migraines    Past Medical History:  Diagnosis Date  . Abnormal liver function test   . Bronchiectasis       . Fibromyalgia   . Heart murmur   . Menorrhagia   . MIGRAINE HEADACHE   . MVP (mitral valve prolapse)   . Polymyositis ([place])    Dr [name]; chronic MTX  . Pulmonary fibrosis ([place])   . Rheumatoid arthritis [facility])     Past Surgical History:  Procedure Laterality Date  . ablation uterine  2010  . CARDIAC CATHETERIZATION  2002   normal  . DILATION AND CURETTAGE OF UTERUS  08-31-08   Dr [name]    Current Outpatient Medications  Medication Sig Dispense Refill  . albuterol (PROVENTIL HFA) 108 (90 Base) MCG/ACT inhaler Inhale 2 puffs into the lungs 4 (four) times daily as needed. Reported on 09/03/2015 1 Inhaler 4  . albuterol (PROVENTIL) (2.5 MG/3ML) 0.083% nebulizer solution Take 3 mLs (2.5 mg total) by  nebulization every 6 (six) hours as needed for wheezing or shortness of breath. 120 vial 5  . atenolol (TENORMIN) 25 MG tablet TAKE 1/2 TABLET BY MOUTH DAILY AS NEEDED FOR SYMPTOMS OF MITRAL PROLAPSE 45 tablet 1  . azaTHIOprine (IMURAN) 50 MG tablet Take 2 tablets (100 mg total) by mouth daily. 60 tablet 2  . cyclobenzaprine (FLEXERIL) 10 MG tablet Take 5 mg by mouth at bedtime as needed. Reported on 09/03/2015    . diclofenac sodium (VOLTAREN) 1 % GEL Apply 2 g topically 4 (four) times daily as needed (  pain). 300 g 1  . eletriptan (RELPAX) 20 MG tablet One tablet by mouth at onset of headache. May repeat in 2 hours if headache persists or recurs. 10 tablet 0  . fluticasone furoate-vilanterol (BREO ELLIPTA) 100-25 MCG/INH AEPB Inhale 1 puff into the lungs daily. 60 each 5  . folic acid (FOLVITE) 1 MG tablet Take 2 tablets (2 mg total) by mouth daily. 180 tablet 3  . guaiFENesin (MUCINEX) 600 MG 12 hr tablet Take 2 tablets (1,200 mg total) by mouth 2 (two) times daily as needed for cough or to loosen phlegm.    [name] Respiratory Therapy Supplies (FLUTTER) DEVI Use as directed (Patient taking differently: 4 (four) times daily. Use as directed) 1 each 0  . traMADol (ULTRAM) 50 MG tablet Take 50 mg by mouth every 6 (six) hours as needed for moderate pain. Reported on 09/03/2015    . Vitamin D, Ergocalciferol, (DRISDOL) 50000 units CAPS capsule Take 1 capsule (50,000 Units total) by mouth every 30 (thirty) days. 3 capsule 1  . budesonide-formoterol (SYMBICORT) 80-4.5 MCG/ACT inhaler Inhale 2 puffs into the lungs 2 (two) times daily. (Patient not taking: Reported on 08/12/2018) 1 Inhaler 0   No current facility-administered medications for this visit.     Allergies as of 08/12/2018 - Review Complete 08/12/2018  Allergen Reaction Noted  . Sulfonamide derivatives      Vitals: BP 132/78 (BP Location: Left Arm, Patient Position: Sitting)   Pulse 74   Ht _0  (1.727 m)   Wt 279 lb (126.6 kg)   BMI 42.42  kg/m  Last Weight:  Wt Readings from Last 1 Encounters:  08/12/18 279 lb (126.6 kg)   Last Height:   Ht Readings from Last 1 Encounters:  08/12/18 _1  (1.727 m)   Physical exam: Exam: Gen: NAD, conversant, well nourised, obese, well groomed                     CV: RRR, no MRG. No Carotid Bruits. No peripheral edema, warm, nontender Eyes: Conjunctivae clear without exudates or hemorrhage  Neuro: Detailed Neurologic Exam  Speech:    Speech is normal; fluent and spontaneous with normal comprehension.  Cognition:    The patient is oriented to person, place, and time;     recent and remote memory intact;     language fluent;     normal attention, concentration,     fund of knowledge Cranial Nerves:    The pupils are equal, round, and reactive to light. The fundi are normal and spontaneous venous pulsations are present. Visual fields are full to finger confrontation. Extraocular movements are intact. Trigeminal sensation is intact and the muscles of mastication are normal. The face is symmetric. The palate elevates in the midline. Hearing intact. Voice is normal. Shoulder shrug is normal. The tongue has normal motion without fasciculations.   Coordination:    Normal finger to nose and heel to shin. Normal rapid alternating movements.   Gait:    Heel-toe and tandem gait are normal.   Motor Observation:    No asymmetry, no atrophy, and no involuntary movements noted. Tone:    Normal muscle tone.    Posture:    Posture is normal. normal erect    Strength:    Strength is V/V in the upper and lower limbs.      Sensation: intact to LT     Reflex Exam:  DTR's:    Deep tendon reflexes in the upper and lower extremities  are normal bilaterally.   Toes:    The toes are downgoing bilaterally.   Clonus:    Clonus is absent.       Assessment/Plan:   53 y.o. female here as requested by Dr. Quay Burow for headaches. PMHx RA, bronchiectasis, polymyositis on imuran follows with  Rheumatology, never smoked, interstitial lung disease, fibromyalgia, migraines, mitral valve prolapse, abnormal liver function test. She reports she has headaches that come on during exertion and dissipate without medication when she slows down or alters activities. These headaches occur in the mornings in the setting of likely unreated sleep apnea   - She completed of sleep evaluation, discussed can be a cause of headaches and carry other significant sequelae such as stroke. Awaiting results from Dr. Juanetta Gosling office.   - Obesity: healthy weight and wellness center referral  - Given exertional headaches, morning headaches, need to evaluate for etiologies such as SAH, space occupying lesion, aneurysm  Orders Placed This Encounter  Procedures  . MR BRAIN W WO CONTRAST  . MR MRA HEAD WO CONTRAST  . Ambulatory referral to [facility]   To prevent or relieve headaches, try the following: Cool Compress. Lie down and place a cool compress on your head.  Avoid headache triggers. If certain foods or odors seem to have triggered your migraines in the past, avoid them. A headache diary might help you identify triggers.  Include physical activity in your daily routine. Try a daily walk or other moderate aerobic exercise.  Manage stress. Find healthy ways to cope with the stressors, such as delegating tasks on your to-do list.  Practice relaxation techniques. Try deep breathing, yoga, massage and visualization.  Eat regularly. Eating regularly scheduled meals and maintaining a healthy diet might help prevent headaches. Also, drink plenty of fluids.  Follow a regular sleep schedule. Sleep deprivation might contribute to headaches Consider biofeedback. With this mind-body technique, you learn to control certain bodily functions - such as muscle tension, heart rate and blood pressure - to prevent headaches or reduce headache pain.    Proceed to emergency room if you experience new or worsening symptoms  or symptoms do not resolve, if you have new neurologic symptoms or if headache is severe, or for any concerning symptom.   Provided education and documentation from American headache Society toolbox including articles on: chronic migraine medication overuse headache, chronic migraines, prevention of migraines, behavioral and other nonpharmacologic treatments for headache.    Cc:   Binnie Rail, MD Lauraine Rinne NP  Sarina Ill, MD  [facility] [address] [place], [place] [address]  Phone [phone] Fax [phone]

## 2018-08-12 NOTE — Telephone Encounter (Signed)
Calling requesting what? We do not have her HST results yet. I do not believe its been read by [name] yet.   Aaron Edelman

## 2018-08-12 NOTE — Progress Notes (Signed)
[facility] and requested a recent CPAP compliance report.

## 2018-08-12 NOTE — Telephone Encounter (Signed)
Called and spoke with [facility], she stated that they are needing the last compliance report for the patient. Advised that the patient was not on a CPAP machine but she did do the sleep study, they are requesting the results of this. Aaron Edelman please advise, thank you.

## 2018-08-12 NOTE — Patient Instructions (Signed)
[facility] wellness center MRI brain and MRA head Treat sleep apnea

## 2018-08-13 ENCOUNTER — Telehealth: Payer: Self-pay | Admitting: Neurology

## 2018-08-13 NOTE — Telephone Encounter (Signed)
Called and spoke with patient, she is aware and verbalized understanding. Patient has decided to leave appointment as is due to other appointments she has.

## 2018-08-13 NOTE — Telephone Encounter (Signed)
Call made to Dr. Cathren Laine office,  made aware patient's sleep study has not been read yet. Made aware as soon as study has been read the patient would be notified and we would place a order/reminder to fax compliance report after first 30 days to Dr. Jaynee Eagles at [phone]. Will route message to VS and nurse to make aware.   VS please advise.

## 2018-08-13 NOTE — Telephone Encounter (Signed)
Thank you.  Lauren:Can you please contact the patient let her know she has mild obstructive sleep apnea we can discuss this at January/20/2020 office visit or patient can move that office visit up if she would like to discuss treatment options if she would like to have any.  Primary FNP

## 2018-08-13 NOTE — Telephone Encounter (Signed)
MR Brain w/wo contrast & MRA Head wo contrast Dr. Jaynee Eagles BC[employer] Fed Auth: [place] [facility] Ref # [id number]. Patient is scheduled at [facility] for 08/20/18

## 2018-08-13 NOTE — Telephone Encounter (Signed)
HST 07/28/18 >> AHI 5, SpO2 low 83%   Mild obstructive sleep apnea.  She needs ROV to discuss treatment options.

## 2018-08-20 ENCOUNTER — Ambulatory Visit: Payer: Federal, State, Local not specified - PPO

## 2018-08-20 ENCOUNTER — Encounter: Payer: Self-pay | Admitting: Pulmonary Disease

## 2018-08-20 ENCOUNTER — Ambulatory Visit (INDEPENDENT_AMBULATORY_CARE_PROVIDER_SITE_OTHER): Payer: Federal, State, Local not specified - PPO | Admitting: Pulmonary Disease

## 2018-08-20 VITALS — BP 122/62 | HR 76 | Ht 68.0 in | Wt 279.4 lb

## 2018-08-20 DIAGNOSIS — G4484 Primary exertional headache: Secondary | ICD-10-CM

## 2018-08-20 DIAGNOSIS — G4483 Primary cough headache: Secondary | ICD-10-CM

## 2018-08-20 DIAGNOSIS — R51 Headache with orthostatic component, not elsewhere classified: Secondary | ICD-10-CM

## 2018-08-20 DIAGNOSIS — M3320 Polymyositis, organ involvement unspecified: Secondary | ICD-10-CM | POA: Diagnosis not present

## 2018-08-20 DIAGNOSIS — J479 Bronchiectasis, uncomplicated: Secondary | ICD-10-CM

## 2018-08-20 DIAGNOSIS — R519 Headache, unspecified: Secondary | ICD-10-CM

## 2018-08-20 DIAGNOSIS — G4733 Obstructive sleep apnea (adult) (pediatric): Secondary | ICD-10-CM | POA: Diagnosis not present

## 2018-08-20 DIAGNOSIS — J849 Interstitial pulmonary disease, unspecified: Secondary | ICD-10-CM | POA: Diagnosis not present

## 2018-08-20 MED ORDER — GADOBENATE DIMEGLUMINE 529 MG/ML IV SOLN
20.0000 mL | Freq: Once | INTRAVENOUS | Status: AC | PRN
  Administered 2018-08-20: 20 mL via INTRAVENOUS

## 2018-08-20 NOTE — Patient Instructions (Addendum)
New CPAP start   Start CPAP >>>[facility] >>> APAP 5-15  >>>mask of choice  >>>supplies for 1 year   We recommend that you start using your CPAP daily >>>Keep up the hard work using your device >>> Goal should be wearing this for the entire night that you are sleeping, at least 4 to 6 hours  Remember:  . Do not drive or operate heavy machinery if tired or drowsy.  . Please notify the supply company and office if you are unable to use your device regularly due to missing supplies or machine being broken.  . Work on maintaining a healthy weight and following your recommended nutrition plan  . Maintain proper daily exercise and movement  . Maintaining proper use of your device can also help improve management of other chronic illnesses such as: Blood pressure, blood sugars, and weight management.   BiPAP/ CPAP Cleaning:  >>>Clean weekly, with Dawn soap, and bottle brush.  Set up to air dry.   Bronchiectasis: This is the medical term which indicates that you have damage, dilated airways making you more susceptible to respiratory infection. Use a flutter valve 10 breaths twice a day or 4 to 5 breaths 4-5 times a day to help clear mucus out Let us know if you have cough with change in mucus color or fevers or chills.  At that point you would need an antibiotic. Maintain a healthy nutritious diet, eating whole foods Take your medications as prescribed   Continue daily mucinex  Continue plan for Pulmonary Rehab   Breo Ellipta 100 >>> Take 1 puff daily in the morning right when you wake up >>>Rinse your mouth out after use >>>This is a daily maintenance inhaler, NOT a rescue inhaler >>>Contact our office if you are having difficulties affording or obtaining this medication >>>It is important for you to be able to take this daily and not miss any doses     Follow up with [name] in 2 months     It is flu season:   >>>Remember to be washing your hands regularly, using  hand sanitizer, be careful to use around herself with has contact with people who are sick will increase her chances of getting sick yourself. >>> Best ways to protect herself from the flu: Receive the yearly flu vaccine, practice good hand hygiene washing with soap and also using hand sanitizer when available, eat a nutritious meals, get adequate rest, hydrate appropriately   Please contact the office if your symptoms worsen or you have concerns that you are not improving.   Thank you for choosing [facility] for your healthcare, and for allowing [name] to partner with you on your healthcare journey. I am thankful to be able to provide care to you today.   [name] FNP-C    Sleep Apnea Sleep apnea is a condition in which breathing pauses or becomes shallow during sleep. Episodes of sleep apnea usually last 10 seconds or longer, and they may occur as many as 20 times an hour. Sleep apnea disrupts your sleep and keeps your body from getting the rest that it needs. This condition can increase your risk of certain health problems, including:  Heart attack.  Stroke.  Obesity.  Diabetes.  Heart failure.  Irregular heartbeat. There are three kinds of sleep apnea:  Obstructive sleep apnea. This kind is caused by a blocked or collapsed airway.  Central sleep apnea. This kind happens when the part of the brain that controls breathing  does not send the correct signals to the muscles that control breathing.  Mixed sleep apnea. This is a combination of obstructive and central sleep apnea. What are the causes? The most common cause of this condition is a collapsed or blocked airway. An airway can collapse or become blocked if:  Your throat muscles are abnormally relaxed.  Your tongue and tonsils are larger than normal.  You are overweight.  Your airway is smaller than normal. What increases the risk? This condition is more likely to develop in people who:  Are  overweight.  Smoke.  Have a smaller than normal airway.  Are elderly.  Are female.  Drink alcohol.  Take sedatives or tranquilizers.  Have a family history of sleep apnea. What are the signs or symptoms? Symptoms of this condition include:  Trouble staying asleep.  Daytime sleepiness and tiredness.  Irritability.  Loud snoring.  Morning headaches.  Trouble concentrating.  Forgetfulness.  Decreased interest in sex.  Unexplained sleepiness.  Mood swings.  Personality changes.  Feelings of depression.  Waking up often during the night to urinate.  Dry mouth.  Sore throat. How is this diagnosed? This condition may be diagnosed with:  A medical history.  A physical exam.  A series of tests that are done while you are sleeping (sleep study). These tests are usually done in a sleep lab, but they may also be done at home. How is this treated? Treatment for this condition aims to restore normal breathing and to ease symptoms during sleep. It may involve managing health issues that can affect breathing, such as high blood pressure or obesity. Treatment may include:  Sleeping on your side.  Using a decongestant if you have nasal congestion.  Avoiding the use of depressants, including alcohol, sedatives, and narcotics.  Losing weight if you are overweight.  Making changes to your diet.  Quitting smoking.  Using a device to open your airway while you sleep, such as: ? An oral appliance. This is a custom-made mouthpiece that shifts your lower jaw forward. ? A continuous positive airway pressure (CPAP) device. This device delivers oxygen to your airway through a mask. ? A nasal expiratory positive airway pressure (EPAP) device. This device has valves that you put into each nostril. ? A bi-level positive airway pressure (BPAP) device. This device delivers oxygen to your airway through a mask.  Surgery if other treatments do not work. During surgery, excess  tissue is removed to create a wider airway. It is important to get treatment for sleep apnea. Without treatment, this condition can lead to:  High blood pressure.  Coronary artery disease.  (Men) An inability to achieve or maintain an erection (impotence).  Reduced thinking abilities. Follow these instructions at home:  Make any lifestyle changes that your health care provider recommends.  Eat a healthy, well-balanced diet.  Take over-the-counter and prescription medicines only as told by your health care provider.  Avoid using depressants, including alcohol, sedatives, and narcotics.  Take steps to lose weight if you are overweight.  If you were given a device to open your airway while you sleep, use it only as told by your health care provider.  Do not use any tobacco products, such as cigarettes, chewing tobacco, and e-cigarettes. If you need help quitting, ask your health care provider.  Keep all follow-up visits as told by your health care provider. This is important. Contact a health care provider if:  The device that you received to open your airway during sleep  is uncomfortable or does not seem to be working.  Your symptoms do not improve.  Your symptoms get worse. Get help right away if:  You develop chest pain.  You develop shortness of breath.  You develop discomfort in your back, arms, or stomach.  You have trouble speaking.  You have weakness on one side of your body.  You have drooping in your face. These symptoms may represent a serious problem that is an emergency. Do not wait to see if the symptoms will go away. Get medical help right away. Call your local emergency services (911 in the [place] Do not drive yourself to the hospital. This information is not intended to replace advice given to you by your health care provider. Make sure you discuss any questions you have with your health care provider. Document Released: 07/13/2002 Document Revised:  02/18/2017 Document Reviewed: 05/02/2015 Elsevier Interactive Patient Education  2019 [place].     CPAP and BPAP Information CPAP and BPAP are methods of helping a person breathe with the use of air pressure. CPAP stands for "continuous positive airway pressure." BPAP stands for "bi-level positive airway pressure." In both methods, air is blown through your nose or mouth and into your air passages to help you breathe well. CPAP and BPAP use different amounts of pressure to blow air. With CPAP, the amount of pressure stays the same while you breathe in and out. With BPAP, the amount of pressure is increased when you breathe in (inhale) so that you can take larger breaths. Your health care provider will recommend whether CPAP or BPAP would be more helpful for you. Why are CPAP and BPAP treatments used? CPAP or BPAP can be helpful if you have:  Sleep apnea.  Chronic obstructive pulmonary disease (COPD).  Heart failure.  Medical conditions that weaken the muscles of the chest including muscular dystrophy, or neurological diseases such as amyotrophic lateral sclerosis (ALS).  Other problems that cause breathing to be weak, abnormal, or difficult. CPAP is most commonly used for obstructive sleep apnea (OSA) to keep the airways from collapsing when the muscles relax during sleep. How is CPAP or BPAP administered? Both CPAP and BPAP are provided by a small machine with a flexible plastic tube that attaches to a plastic mask. You wear the mask. Air is blown through the mask into your nose or mouth. The amount of pressure that is used to blow the air can be adjusted on the machine. Your health care provider will determine the pressure setting that should be used based on your individual needs. When should CPAP or BPAP be used? In most cases, the mask only needs to be worn during sleep. Generally, the mask needs to be worn throughout the night and during any daytime naps. People with certain  medical conditions may also need to wear the mask at other times when they are awake. Follow instructions from your health care provider about when to use the machine. What are some tips for using the mask?   Because the mask needs to be snug, some people feel trapped or closed-in (claustrophobic) when first using the mask. If you feel this way, you may need to get used to the mask. One way to do this is by holding the mask loosely over your nose or mouth and then gradually applying the mask more snugly. You can also gradually increase the amount of time that you use the mask.  Masks are available in various types and sizes. Some fit over  your mouth and nose while others fit over just your nose. If your mask does not fit well, talk with your health care provider about getting a different one.  If you are using a mask that fits over your nose and you tend to breathe through your mouth, a chin strap may be applied to help keep your mouth closed.  The CPAP and BPAP machines have alarms that may sound if the mask comes off or develops a leak.  If you have trouble with the mask, it is very important that you talk with your health care provider about finding a way to make the mask easier to tolerate. Do not stop using the mask. Stopping the use of the mask could have a negative impact on your health. What are some tips for using the machine?  Place your CPAP or BPAP machine on a secure table or stand near an electrical outlet.  Know where the on/off switch is located on the machine.  Follow instructions from your health care provider about how to set the pressure on your machine and when you should use it.  Do not eat or drink while the CPAP or BPAP machine is on. Food or fluids could get pushed into your lungs by the pressure of the CPAP or BPAP.  Do not smoke. Tobacco smoke residue can damage the machine.  For home use, CPAP and BPAP machines can be rented or purchased through home health care  companies. Many different brands of machines are available. Renting a machine before purchasing may help you find out which particular machine works well for you.  Keep the CPAP or BPAP machine and attachments clean. Ask your health care provider for specific instructions. Get help right away if:  You have redness or open areas around your nose or mouth where the mask fits.  You have trouble using the CPAP or BPAP machine.  You cannot tolerate wearing the CPAP or BPAP mask.  You have pain, discomfort, and bloating in your abdomen. Summary  CPAP and BPAP are methods of helping a person breathe with the use of air pressure.  Both CPAP and BPAP are provided by a small machine with a flexible plastic tube that attaches to a plastic mask.  If you have trouble with the mask, it is very important that you talk with your health care provider about finding a way to make the mask easier to tolerate. This information is not intended to replace advice given to you by your health care provider. Make sure you discuss any questions you have with your health care provider. Document Released: 04/20/2004 Document Revised: 03/25/2018 Document Reviewed: 06/11/2016 Elsevier Interactive Patient Education  2019 [employer].     Bronchiectasis  Bronchiectasis is a condition in which the airways in the lungs (bronchi) are damaged and widened. The condition makes it hard for the lungs to get rid of mucus, and it causes mucus to gather in the bronchi. This condition often leads to lung infections, which can make the condition worse. What are the causes? You can be born with this condition or you can develop it later in life. Common causes of this condition include:  Cystic fibrosis.  Repeated lung infections, such as pneumonia or tuberculosis.  An object or other blockage in the lungs.  Breathing in fluid, food, or other objects (aspiration).  A problem with the immune system and lung structure that  is present at birth (congenital). Sometimes the cause is not known. What are the  signs or symptoms? Common symptoms of this condition include:  A daily cough that brings up mucus and lasts for more than 3 weeks.  Lung infections that happen often.  Shortness of breath and wheezing.  Weakness and fatigue. How is this diagnosed? This condition is diagnosed with tests, such as:  Chest X-rays or CT scans. These are done to check for changes in the lungs.  Breathing tests. These are done to check how well your lungs are working.  A test of a sample of your saliva (sputum culture). This test is done to check for infection.  Blood tests and other tests. These are done to check for related diseases or causes. How is this treated? Treatment for this condition depends on the severity of the illness and its cause. Treatment may include:  Medicines that loosen mucus so it can be coughed up (expectorants).  Medicines that relax the muscles of the bronchi (bronchodilators).  Antibiotic medicines to prevent or treat infection.  Physical therapy to help clear mucus from the lungs. Techniques may include: ? Postural drainage. This is when you sit or lie in certain positions so that mucus can drain by gravity. ? Chest percussion. This involves tapping the chest or back with a cupped hand. ? Chest vibration. For this therapy, a hand or special equipment vibrates your chest and back.  Surgery to remove the affected part of the lung. This may be done in severe cases. Follow these instructions at home: Medicines  Take over-the-counter and prescription medicines only as told by your health care provider.  If you were prescribed an antibiotic medicine, take it as told by your health care provider. Do not stop taking the antibiotic even if you start to feel better.  Avoid taking sedatives and antihistamines unless your health care provider tells you to take them. These medicines tend to thicken  the mucus in the lungs. Managing symptoms  Perform breathing exercises or techniques to clear your lungs as told by your health care provider.  Consider using a cold steam vaporizer or humidifier in your room or home to help loosen secretions.  If you have a cough that gets worse at night, try sleeping in a semi-upright position. General instructions  Get plenty of rest.  Drink enough fluid to keep your urine clear or pale yellow.  Stay inside when pollution and ozone levels are high.  Stay up to date with vaccinations and immunizations.  Avoid cigarette smoke and other lung irritants.  Do not use any products that contain nicotine or tobacco, such as cigarettes and e-cigarettes. If you need help quitting, ask your health care provider.  Keep all follow-up visits as told by your health care provider. This is important. Contact a health care provider if:  You cough up more sputum than before and the sputum is yellow or green in color.  You have a fever.  You cannot control your cough and are losing sleep. Get help right away if:  You cough up blood.  You have chest pain.  You have increasing shortness of breath.  You have pain that gets worse or is not controlled with medicines.  You have a fever and your symptoms suddenly get worse. Summary  Bronchiectasis is a condition in which the airways in the lungs (bronchi) are damaged and widened. The condition makes it hard for the lungs to get rid of mucus, and it causes mucus to gather in the bronchi.  Treatment usually includes therapy to help clear mucus from  the lungs.  Stay up to date with vaccinations and immunizations. This information is not intended to replace advice given to you by your health care provider. Make sure you discuss any questions you have with your health care provider. Document Released: 05/20/2007 Document Revised: 08/27/2016 Document Reviewed: 08/27/2016 Elsevier Interactive Patient Education   2019 [employer].

## 2018-08-20 NOTE — Assessment & Plan Note (Signed)
Plan: Continue follow-up with rheumatology Continue Imuran

## 2018-08-20 NOTE — Assessment & Plan Note (Addendum)
Plan: Follow-up with [name] in 2 months Continue follow-up with rheumatology Continue Imuran Continue with pulmonary rehab as planned

## 2018-08-20 NOTE — Progress Notes (Signed)
@Patient  ID: Jill Harper, female    DOB: Jul 28, 1966, 53 y.o.   MRN: [id number]  Chief Complaint  Patient presents with  . Follow-up    Home sleep study results    Referring provider: Binnie Rail, MD  HPI:  53 year old female followed in our office for bronchiectasis in the setting of rheumatoid arthritis and polymyositis  PMH: Rheumatoid arthritis, polymyositis, maintained on Imuran per rheumatology Smoker/ Smoking History: Never smoker Maintenance: Breo Ellipta 100 Pt of: [name]  08/20/2018  - Visit   53 year old female presenting to our office for follow-up to discuss home sleep study results.  Patient was diagnosed with mild obstructive sleep apnea with an AHI of 5.  Patient is also completed follow-up with neurology for her exertional headaches.  Per patient neurology also suspects that some of her headaches are driven from obstructive sleep apnea.  She has planned MRIs later on today to evaluate.  Patient also has started to adhere to a much stricter bronchiectasis regimen.  She has been using her flutter valve twice a day and taking her Mucinex and reports she is had success with clearing sputum.  She still has a baseline morning cough that is productive with light yellow sputum but throughout the day she coughs less and feels like she is breathing better.  Patient also reports that she feels her exertional headaches are better since adhering to this regimen strictly.  Patient is still waiting to be contacted to be set up with pulmonary rehab.  The referral is in.  Patient remains adherent to Breo 100.  Patient was trialed on Symbicort 80 but she prefers [employer].  Patient continues to follow-up with rheumatology and is currently maintained on Imuran.  Patient reports her lab work has been stable and she is at full dosing of Imuran at this time.   Tests:  HST 07/28/18 >> AHI 5, SpO2 low 83%  Chest imaging: CT chest 03/23/05 >> patchy b/l lower lung ASD with cylindrical  BTX CT chest 02/17/08 >> peripheral and basilar predominant subpleural GGO CT chest 12/01/08 >> no change HRCT chest 09/07/15 >> scattered GGO 04/15/2018-CT chest high-res- spectrum of findings compatible with basilar predominant fibrotic interstitial lung disease with progression since 2017,  UIP  Pulmonarytests: PFT 10/30/11 >> FEV1 3.31 (114%), FEV1% 85, TLC 6.06 (108%), DLCO 77%, no BD PFT 11/18/12 >> FEV1 3.18 (111%), FEV1% 84, TLC 5.34 (95%), DLCO 67%, no BD PFT 12/02/15 >> FEV1 2.96 (108%), FEV1% 92, TLC 4.95 (86%), DLCO 65%, no BD  04/17/2018-spirometry with DLCO-DLCO 76%  Serology 01/31/17 >> ANA negative, RF 29, anti CCP 213, SCL 70 negative, SSA/SSB negative, ENA SM negative, Jo-1 positive Quantiferon gold 01/31/17 >> negative  Cardiac tests: Echo 05/29/17 >> EF 60 to 65%, mod LVH, grade 1 DD Echo 05/23/18 >> mod LVH, EF 60 to 65%, grade 1 DD, mild MR  FENO:  Lab Results  Component Value Date   NITRICOXIDE 9 12/11/2017    PFT: PFT Results Latest Ref Rng & Units 04/17/2018 12/02/2015  FVC-Pre L 2.74 3.40  FVC-Predicted Pre % 90 99  FVC-Post L - 3.24  FVC-Predicted Post % - 94  Pre FEV1/FVC % % 90 87  Post FEV1/FCV % % - 92  FEV1-Pre L 2.46 2.97  FEV1-Predicted Pre % 101 108  FEV1-Post L - 2.96  DLCO UNC% % 76 65  DLCO COR %Predicted % 98 79  TLC L - 4.95  TLC % Predicted % - 86  RV %  Predicted % - 76   SIX MIN WALK 07/28/2018 05/16/2018  Medications Breo 100 at 7am -  Supplimental Oxygen during Test? (L/min) No No  Laps 9 -  Partial Lap (in Meters) 0 -  Baseline BP (sitting) 118/70 -  Baseline Heartrate 83 -  Baseline Dyspnea (Borg Scale) 1 -  Baseline Fatigue (Borg Scale) 0.5 -  Baseline SPO2 95 -  BP (sitting) 130/80 -  Heartrate 84 -  Dyspnea (Borg Scale) 3 -  Fatigue (Borg Scale) 3 -  SPO2 96 -  BP (sitting) 122/76 -  Heartrate 79 -  SPO2 97 -  Interpretation Angina;Dizziness;Leg pain -  Distance Completed 306 -  Tech Comments: pt walked at a normal  pace during walk denying any complaints during walk.  pt did occassionally cough during walk pt had to stop after two laps due to SOB and feeling dizzy.kmw     Imaging: No results found.    Specialty Problems      Pulmonary Problems   BRONCHIECTASIS    Dr Halford Chessman  CT sinus 09/07/2015 neg for acute dz.  HRCT 09/07/2015 >Basilar predominant peribronchovascular ground-glass is indicative of bronchopneumonia. . No definitive evidence of underlying interstitial lung disease.            Throat clearing   Cough   Wheezing   Bronchiectasis (HCC)   Dyspnea    PFT 12/02/15 >> FEV1 2.96 (108%), FEV1% 92, TLC 4.95 (86%), DLCO 65%, no BD  HRCT chest 09/07/15 >> scattered GGO      ILD (interstitial lung disease) ([place])    HRCT chest 09/07/15 >> scattered GGO 04/15/2018-CT chest high-res- spectrum of findings compatible with basilar predominant fibrotic interstitial lung disease with progression since 2017,  UIP  Pulmonarytests: PFT 10/30/11 >> FEV1 3.31 (114%), FEV1% 85, TLC 6.06 (108%), DLCO 77%, no BD PFT 11/18/12 >> FEV1 3.18 (111%), FEV1% 84, TLC 5.34 (95%), DLCO 67%, no BD PFT 12/02/15 >> FEV1 2.96 (108%), FEV1% 92, TLC 4.95 (86%), DLCO 65%, no BD 04/17/2018-spirometry with DLCO-DLCO 76%  Serology 01/31/17 >> ANA negative, RF 29, anti CCP 213, SCL 70 negative, SSA/SSB negative, ENA SM negative, Jo-1 positive Quantiferon gold 01/31/17 >> negative       OSA (obstructive sleep apnea)    HST 07/28/18 >> AHI 5, SpO2 low 83%         Allergies  Allergen Reactions  . Sulfonamide Derivatives     REACTION: hives    Immunization History  Administered Date(s) Administered  . Influenza Split 04/10/2011, 06/18/2012, 05/06/2013  . Influenza Whole 05/19/2008, 05/15/2009, 06/01/2010  . Influenza,inj,Quad PF,6+ Mos 04/20/2014, 05/26/2015, 05/31/2016, 04/05/2017, 06/16/2018  . Pneumococcal Conjugate-13 01/29/2017  . Pneumococcal Polysaccharide-23 08/07/2007, 06/16/2018  . Td 07/12/2009     Past Medical History:  Diagnosis Date  . Abnormal liver function test   . Bronchiectasis       . Fibromyalgia   . Heart murmur   . Menorrhagia   . MIGRAINE HEADACHE   . MVP (mitral valve prolapse)   . Polymyositis ([place])    Dr [name]; chronic MTX  . Pulmonary fibrosis ([place])   . Rheumatoid arthritis ([place])     Tobacco History: Social History   Tobacco Use  Smoking Status Never Smoker  Smokeless Tobacco Never Used  Tobacco Comment   Married, lives with spouse. 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 albuterol (PROVENTIL HFA) 108 (90 Base) MCG/ACT inhaler Inhale 2 puffs into the lungs 4 (four) times daily as needed. Reported on 09/03/2015  . albuterol (PROVENTIL) (2.5 MG/3ML) 0.083% nebulizer solution Take 3 mLs (2.5 mg total) by nebulization every 6 (six) hours as needed for wheezing or shortness of breath.  [name] atenolol (TENORMIN) 25 MG tablet TAKE 1/2 TABLET BY MOUTH DAILY AS NEEDED FOR SYMPTOMS OF MITRAL PROLAPSE  . azaTHIOprine (IMURAN) 50 MG tablet Take 2 tablets (100 mg total) by mouth daily.  . cyclobenzaprine (FLEXERIL) 10 MG tablet Take 5 mg by mouth at bedtime as needed. Reported on 09/03/2015  . diclofenac sodium (VOLTAREN) 1 % GEL Apply 2 g topically 4 (four) times daily as needed (pain).  [name] eletriptan (RELPAX) 20 MG tablet One tablet by mouth at onset of headache. May repeat in 2 hours if headache persists or recurs.  . fluticasone furoate-vilanterol (BREO ELLIPTA) 100-25 MCG/INH AEPB Inhale 1 puff into the lungs daily.  . folic acid (FOLVITE) 1 MG tablet Take 2 tablets (2 mg total) by mouth daily.  [name] guaiFENesin (MUCINEX) 600 MG 12 hr tablet Take 2 tablets (1,200 mg total) by mouth 2 (two) times daily as  needed for cough or to loosen phlegm.  [name] Respiratory Therapy Supplies (FLUTTER) DEVI Use as directed (Patient taking differently: 4 (four) times daily. Use as directed)  . traMADol (ULTRAM) 50 MG tablet Take 50 mg by mouth every 6 (six) hours as needed for moderate pain. Reported on 09/03/2015  . Vitamin D, Ergocalciferol, (DRISDOL) 50000 units CAPS capsule Take 1 capsule (50,000 Units total) by mouth every 30 (thirty) days.  . budesonide-formoterol (SYMBICORT) 80-4.5 MCG/ACT inhaler Inhale 2 puffs into the lungs 2 (two) times daily. (Patient not taking: Reported on 08/12/2018)   No facility-administered encounter medications on file as of 08/20/2018.      Review of Systems  Review of Systems  Constitutional: Positive for fatigue. Negative for chills and fever.  HENT: Negative for congestion and postnasal drip.   Respiratory: Positive for cough (productive in am with yellow mucous, then dry cough ) and wheezing (Occasionally). Negative for shortness of breath.   Cardiovascular: Negative for chest pain and palpitations.  Gastrointestinal: Negative for diarrhea, nausea and vomiting.  Neurological: Positive for headaches (Morning headache, improved exertional headache).  Psychiatric/Behavioral: Negative for dysphoric mood. The patient is not nervous/anxious.      Physical Exam  BP 122/62 (BP Location: Left Wrist, Cuff Size: Normal)   Pulse 76   Ht 5\' 8"  (1.727 m)   Wt 279 lb 6.4 oz (126.7 kg)   SpO2 96%   BMI 42.48 kg/m   Wt Readings from Last 5 Encounters:  08/20/18 279 lb 6.4 oz (126.7 kg)  08/12/18 279 lb (126.6 kg)  07/28/18 282 lb (127.9 kg)  06/19/18 280 lb 3.2 oz (127.1 kg)  06/16/18 277 lb 6.4 oz (125.8 kg)    Physical Exam  Constitutional: She is oriented to person, place, and time and well-developed, well-nourished, and in no distress. No distress.  HENT:  Head: Normocephalic and atraumatic.  Right Ear: Hearing, tympanic membrane, external ear and ear canal normal.   Left Ear: Hearing, tympanic membrane, external ear and ear canal normal.  Nose: Mucosal edema present. Right sinus exhibits no maxillary sinus tenderness and no frontal sinus tenderness. Left sinus exhibits no maxillary sinus tenderness and no frontal  sinus tenderness.  Mouth/Throat: Uvula is midline and oropharynx is clear and moist. Normal dentition. No dental caries. No oropharyngeal exudate.  Eyes: Pupils are equal, round, and reactive to light.  Neck: Normal range of motion. Neck supple.  Cardiovascular: Normal rate, regular rhythm and normal heart sounds.  Pulmonary/Chest: Effort normal. No accessory muscle usage. No respiratory distress. She has no decreased breath sounds. She has no wheezes. She has no rhonchi. She has rales (Bibasilar crackles left greater than right).  Musculoskeletal: Normal range of motion.        General: No edema.  Lymphadenopathy:    She has no cervical adenopathy.  Neurological: She is alert and oriented to person, place, and time. Gait normal.  Skin: Skin is warm and dry. She is not diaphoretic. No erythema.  Psychiatric: Mood, memory, affect and judgment normal.  Nursing note and vitals reviewed.     Lab Results:  CBC    Component Value Date/Time   WBC 8.6 06/26/2018 1538   RBC 4.92 06/26/2018 1538   HGB 13.5 06/26/2018 1538   HCT 41.0 06/26/2018 1538   PLT 287 06/26/2018 1538   MCV 83.3 06/26/2018 1538   MCH 27.4 06/26/2018 1538   MCHC 32.9 06/26/2018 1538   RDW 12.5 06/26/2018 1538   LYMPHSABS 2,055 06/26/2018 1538   MONOABS 666 01/31/2017 1006   EOSABS 215 06/26/2018 1538   BASOSABS 52 06/26/2018 1538    BMET    Component Value Date/Time   NA 143 06/26/2018 1538   K 4.3 06/26/2018 1538   CL 108 06/26/2018 1538   CO2 27 06/26/2018 1538   GLUCOSE 91 06/26/2018 1538   BUN 14 06/26/2018 1538   CREATININE 0.73 06/26/2018 1538   CALCIUM 9.3 06/26/2018 1538   GFRNONAA 95 06/26/2018 1538   GFRAA 110 06/26/2018 1538    BNP No  results found for: BNP  ProBNP    Component Value Date/Time   PROBNP 58.0 04/18/2009 1157      Assessment & Plan:     OSA (obstructive sleep apnea) Assessment: Epworth 7 Home sleep study results revealed AHI of 5, SPO2 low of 83%, average SPO2 93% Patient still reporting moments of breathlessness where she feels like she cannot breathe at night which is causing her to wake up BMI 42 Postmenopausal female  Plan: Start CPAP Patient to work on healthy weight and increasing exercise Continue with referral to pulmonary rehab Follow-up in 2 months with [name]   ILD (interstitial lung disease) ([place]) Plan: Follow-up with [name] in 2 months Continue follow-up with rheumatology Continue Imuran Continue with pulmonary rehab as planned  Bronchiectasis [facility]) Assessment: Baseline sputum color light yellow in the morning, and cough is improved throughout the day Patient currently taking Mucinex twice a day as well as using her flutter valve twice a day 10 breaths each time Patient reports improved symptoms since strictly adhering to the regimen that we have outlined for her  Plan: Continue flutter valve as instructed Continue Mucinex daily Remain hydrated Eat whole nutritious meals Follow-up with our office if respiratory symptoms worsen or you have increased sputum production or sputum color change from baseline Follow-up with [name] in 2 months  Polymyositis [facility]) Plan: Continue follow-up with rheumatology Continue [place], NP 08/20/2018   This appointment was 27 min long with over 50% of the time in direct face-to-face patient care, assessment, plan of care, and follow-up.

## 2018-08-20 NOTE — Assessment & Plan Note (Signed)
Assessment: Baseline sputum color light yellow in the morning, and cough is improved throughout the day Patient currently taking Mucinex twice a day as well as using her flutter valve twice a day 10 breaths each time Patient reports improved symptoms since strictly adhering to the regimen that we have outlined for her  Plan: Continue flutter valve as instructed Continue Mucinex daily Remain hydrated Eat whole nutritious meals Follow-up with our office if respiratory symptoms worsen or you have increased sputum production or sputum color change from baseline Follow-up with [name] in 2 months

## 2018-08-20 NOTE — Assessment & Plan Note (Signed)
Assessment: Epworth 7 Home sleep study results revealed AHI of 5, SPO2 low of 83%, average SPO2 93% Patient still reporting moments of breathlessness where she feels like she cannot breathe at night which is causing her to wake up BMI 42 Postmenopausal female  Plan: Start CPAP Patient to work on healthy weight and increasing exercise Continue with referral to pulmonary rehab Follow-up in 2 months with [name]

## 2018-08-21 NOTE — Progress Notes (Signed)
Reviewed and agree with assessment/plan.   Kharis Lapenna, MD [facility] 08/01/2016, 12:24 PM Pager:  [phone]  

## 2018-08-25 ENCOUNTER — Telehealth (HOSPITAL_COMMUNITY): Payer: Self-pay

## 2018-08-25 ENCOUNTER — Ambulatory Visit: Payer: Federal, State, Local not specified - PPO | Admitting: Pulmonary Disease

## 2018-08-25 ENCOUNTER — Telehealth: Payer: Self-pay | Admitting: *Deleted

## 2018-08-25 NOTE — Telephone Encounter (Signed)
I called pt's mobile number and spoke with her. Discussed results of MRI brain and MRA head. Patient verbalized understanding and is agreeable to having a CTA head and neck. She is aware that order will be placed and then she will be called to schedule test.

## 2018-08-25 NOTE — Telephone Encounter (Signed)
Referral received from MD [facility] for Pulmonary Rehab with diagnosis of ILD. Clinical review of pt follow up appt on 07/28/18 Pulmonary office note. Pt appropriate for scheduling for Pulmonary rehab.  Will forward to support staff for scheduling and verification of insurance eligibility/benefits with pt consent.   Joycelyn Man, RN, BSN Cardiac and Pulmonary Rehab Nurse

## 2018-08-25 NOTE — Telephone Encounter (Signed)
-----   Message from Melvenia Beam, MD sent at 08/22/2018  5:54 AM EST ----- The MRI of the brain was normal. But the MRA looking at the vessels may have shown a narrowing in one of your blood vessels which I believe is artifactual and not concerning. Still I would like to verify with a CT scan of the blood vessels. Please ask patient if that would be ok and I can order a CTA head/neck w/wo thanks

## 2018-08-27 ENCOUNTER — Telehealth: Payer: Self-pay | Admitting: Neurology

## 2018-08-27 ENCOUNTER — Other Ambulatory Visit: Payer: Self-pay | Admitting: Neurology

## 2018-08-27 DIAGNOSIS — I6521 Occlusion and stenosis of right carotid artery: Secondary | ICD-10-CM

## 2018-08-27 NOTE — Telephone Encounter (Signed)
[employer] order sent to GI. Lvm for pt to be aware I left GI phone number of [phone] and if she has not heard from the next 2-3 business days to give them a call.

## 2018-08-27 NOTE — Telephone Encounter (Signed)
Ok - done - thanks

## 2018-08-28 DIAGNOSIS — G4733 Obstructive sleep apnea (adult) (pediatric): Secondary | ICD-10-CM | POA: Diagnosis not present

## 2018-08-28 DIAGNOSIS — J471 Bronchiectasis with (acute) exacerbation: Secondary | ICD-10-CM | POA: Diagnosis not present

## 2018-09-04 ENCOUNTER — Ambulatory Visit
Admission: RE | Admit: 2018-09-04 | Discharge: 2018-09-04 | Disposition: A | Discharge status: Home / Self Care | Payer: Federal, State, Local not specified - PPO | Source: Ambulatory Visit | Attending: Neurology | Admitting: Neurology

## 2018-09-04 DIAGNOSIS — I6521 Occlusion and stenosis of right carotid artery: Secondary | ICD-10-CM

## 2018-09-04 MED ORDER — IOPAMIDOL (ISOVUE-370) INJECTION 76%
100.0000 mL | Freq: Once | INTRAVENOUS | Status: AC | PRN
  Administered 2018-09-04: 100 mL via INTRAVENOUS

## 2018-09-08 ENCOUNTER — Other Ambulatory Visit: Payer: Self-pay | Admitting: Neurology

## 2018-09-08 ENCOUNTER — Telehealth: Payer: Self-pay | Admitting: Neurology

## 2018-09-08 DIAGNOSIS — I773 Arterial fibromuscular dysplasia: Secondary | ICD-10-CM

## 2018-09-08 NOTE — Telephone Encounter (Signed)
Called patient. Carotid arteries on CTA c/w FMD. Discussed Fibromuscular Dysplasia, causes, risks, sequelae,  I will send you to Dr. Estanislado Pandy for cerebral angiogram in radiology. Discussed other complications including renal artery disease and following up with rheumatology and primary care for further testing if needed.   thanks

## 2018-09-09 ENCOUNTER — Other Ambulatory Visit (HOSPITAL_COMMUNITY): Payer: Self-pay | Admitting: Interventional Radiology

## 2018-09-09 DIAGNOSIS — I773 Arterial fibromuscular dysplasia: Secondary | ICD-10-CM

## 2018-09-11 NOTE — Progress Notes (Deleted)
Office Visit Note  Patient: Jill Harper             Date of Birth: 1966-01-16           MRN: [id number]             PCP: Binnie Rail, MD Referring: Binnie Rail, MD Visit Date: 09/25/2018 Occupation: @GUAROCC @  Subjective:  No chief complaint on file.   History of Present Illness: Jill Harper is a 53 y.o. female ***   Activities of Daily Living:  Patient reports morning stiffness for *** {minute/hour:19697}.   Patient {ACTIONS;DENIES/REPORTS:21021675::"Denies"} nocturnal pain.  Difficulty dressing/grooming: {ACTIONS;DENIES/REPORTS:21021675::"Denies"} Difficulty climbing stairs: {ACTIONS;DENIES/REPORTS:21021675::"Denies"} Difficulty getting out of chair: {ACTIONS;DENIES/REPORTS:21021675::"Denies"} Difficulty using hands for taps, buttons, cutlery, and/or writing: {ACTIONS;DENIES/REPORTS:21021675::"Denies"}  No Rheumatology ROS completed.   PMFS History:  Patient Active Problem List   Diagnosis Date Noted  . OSA (obstructive sleep apnea) 08/20/2018  . Rheumatoid arthritis ([place]) 04/17/2018  . ILD (interstitial lung disease) ([place]) 04/17/2018  . Dyspnea 04/01/2018  . Healthcare maintenance 04/01/2018  . Elevated TSH 03/18/2018  . Non-intractable vomiting without nausea 03/18/2018  . Dysphagia 03/18/2018  . Fever 09/13/2017  . Bronchiectasis ([place]) 09/02/2017  . LVH (left ventricular hypertrophy) 06/02/2017  . Mitral regurgitation 05/22/2017  . Wheezing 04/05/2017  . Jo-1 antibody positive 01/29/2017  . Primary osteoarthritis of both feet 01/29/2017  . Primary osteoarthritis of both hands 01/29/2017  . Other fatigue 01/29/2017  . Trapezoid ligament sprain 01/17/2017  . Left shoulder tendonitis 01/17/2017  . Vitamin D deficiency 04/26/2016  . High risk medication use 03/26/2016  . Frozen shoulder, right 03/26/2016  . Morbid obesity ([place]) 03/26/2016  . Cough 07/18/2015  . Neck mass 08/27/2013  . MVP (mitral valve prolapse)   . Erythema nodosum   . Throat  clearing 05/07/2012  . INCONTINENCE, URGE 05/26/2010  . Migraine headache 02/15/2010  . Fibromyalgia 06/20/2009  . Polymyositis ([place]) 02/11/2008  . BRONCHIECTASIS 02/10/2008    Past Medical History:  Diagnosis Date  . Abnormal liver function test   . Bronchiectasis       . Fibromyalgia   . Heart murmur   . Menorrhagia   . MIGRAINE HEADACHE   . MVP (mitral valve prolapse)   . Polymyositis ([place])    Dr [name]; chronic MTX  . Pulmonary fibrosis ([place])   . Rheumatoid arthritis ([place])     Family History  Problem Relation Age of Onset  . Diabetes Mother   . Fibromyalgia Mother   . Ulcers Mother   . Heart failure Mother   . Multiple myeloma Father   . Lupus Sister   . Other Sister        abdominal adhesions resulting in bowel obstruction  . Migraines Sister   . Heart disease Brother        bypass surgery  . Rheum arthritis Sister   . Multiple myeloma Sister   . Hypertension Sister   . Heart attack Sister   . Diabetes Sister   . Hypertension Sister   . Rheum arthritis Sister   . Diabetes Sister   . Diabetes Brother   . Headache Other        siblings with headaches but not diagnosed as migraines   Past Surgical History:  Procedure Laterality Date  . ablation uterine  2010  . CARDIAC CATHETERIZATION  2002   normal  . DILATION AND CURETTAGE OF UTERUS  08-31-08   Dr [name]   Social History   Social  History Narrative   Exercise: trying to walk - limited by fatigue   Lives at home with husband and daughter   Right handed   Caffeine: occasional coke 1-2x per month   Immunization History  Administered Date(s) Administered  . Influenza Split 04/10/2011, 06/18/2012, 05/06/2013  . Influenza Whole 05/19/2008, 05/15/2009, 06/01/2010  . Influenza,inj,Quad PF,6+ Mos 04/20/2014, 05/26/2015, 05/31/2016, 04/05/2017, 06/16/2018  . Pneumococcal Conjugate-13 01/29/2017  . Pneumococcal Polysaccharide-23 08/07/2007, 06/16/2018  . Td 07/12/2009      Objective: Vital Signs: There were no vitals taken for this visit.   Physical Exam   Musculoskeletal Exam: ***  CDAI Exam: CDAI Score: Not documented Patient Global Assessment: Not documented; Provider Global Assessment: Not documented Swollen: Not documented; Tender: Not documented Joint Exam   Not documented   There is currently no information documented on the homunculus. Go to the Rheumatology activity and complete the homunculus joint exam.  Investigation: No additional findings.  Imaging: Ct Angio Head W Or Wo Contrast  Result Date: 09/05/2018 CLINICAL DATA:  53 y/o F; right carotid stenosis. Headaches and lightheadedness. EXAM: CT ANGIOGRAPHY HEAD AND NECK TECHNIQUE: Multidetector CT imaging of the head and neck was performed using the standard protocol during bolus administration of intravenous contrast. Multiplanar CT image reconstructions and MIPs were obtained to evaluate the vascular anatomy. Carotid stenosis measurements (when applicable) are obtained utilizing NASCET criteria, using the distal internal carotid diameter as the denominator. CONTRAST:  129m ISOVUE-370 IOPAMIDOL (ISOVUE-370) INJECTION 76% COMPARISON:  08/20/2018 MRI and MRA of the head. FINDINGS: CT HEAD FINDINGS Brain: No evidence of acute infarction, hemorrhage, hydrocephalus, extra-axial collection or mass lesion/mass effect. Vascular: As below. Skull: Normal. Negative for fracture or focal lesion. Sinuses: Imaged portions are clear. Orbits: No acute finding. Review of the MIP images confirms the above findings CTA NECK FINDINGS Aortic arch: Standard branching. Imaged portion shows no evidence of aneurysm or dissection. No significant stenosis of the major arch vessel origins. Right carotid system: No evidence of dissection, stenosis (50% or greater) or occlusion. Non stenotic beaded irregularity of mid and upper internal carotid artery (series 12, image 21). Left carotid system: No evidence of dissection,  stenosis (50% or greater) or occlusion. Non stenotic beaded irregularity of mid and upper internal carotid artery (series 12, image 22). Vertebral arteries: Codominant. No evidence of dissection, stenosis (50% or greater) or occlusion. Skeleton: Negative. Other neck: Negative. Upper chest: Mild peripheral fibrosis of the upper lobes. Review of the MIP images confirms the above findings CTA HEAD FINDINGS Anterior circulation: No significant stenosis, proximal occlusion, aneurysm, or vascular malformation. Posterior circulation: No significant stenosis, proximal occlusion, aneurysm, or vascular malformation. Venous sinuses: As permitted by contrast timing, patent. Anatomic variants: Anterior communicating artery and right posterior communicating arteries are patent. No left posterior communicating artery identified, likely hypoplastic or absent. Delayed phase: No abnormal intracranial enhancement. Review of the MIP images confirms the above findings IMPRESSION: 1. Patent carotid and vertebral arteries. No dissection, aneurysm, or hemodynamically significant stenosis utilizing NASCET criteria. 2. Patent anterior and posterior intracranial circulation. No large vessel occlusion, aneurysm, or significant stenosis. 3. Non stenotic beaded irregularity of mid and upper cervical internal carotid arteries compatible with fibromuscular dysplasia. 4. Negative noncontrast CT of the head. No abnormal enhancement after contrast administration. Electronically Signed   By: LKristine GarbeM.D.   On: 09/05/2018 03:15   Ct Angio Neck W Or Wo Contrast  Result Date: 09/05/2018 CLINICAL DATA:  53y/o F; right carotid stenosis. Headaches and lightheadedness. EXAM: CT  ANGIOGRAPHY HEAD AND NECK TECHNIQUE: Multidetector CT imaging of the head and neck was performed using the standard protocol during bolus administration of intravenous contrast. Multiplanar CT image reconstructions and MIPs were obtained to evaluate the vascular  anatomy. Carotid stenosis measurements (when applicable) are obtained utilizing NASCET criteria, using the distal internal carotid diameter as the denominator. CONTRAST:  19m ISOVUE-370 IOPAMIDOL (ISOVUE-370) INJECTION 76% COMPARISON:  08/20/2018 MRI and MRA of the head. FINDINGS: CT HEAD FINDINGS Brain: No evidence of acute infarction, hemorrhage, hydrocephalus, extra-axial collection or mass lesion/mass effect. Vascular: As below. Skull: Normal. Negative for fracture or focal lesion. Sinuses: Imaged portions are clear. Orbits: No acute finding. Review of the MIP images confirms the above findings CTA NECK FINDINGS Aortic arch: Standard branching. Imaged portion shows no evidence of aneurysm or dissection. No significant stenosis of the major arch vessel origins. Right carotid system: No evidence of dissection, stenosis (50% or greater) or occlusion. Non stenotic beaded irregularity of mid and upper internal carotid artery (series 12, image 21). Left carotid system: No evidence of dissection, stenosis (50% or greater) or occlusion. Non stenotic beaded irregularity of mid and upper internal carotid artery (series 12, image 22). Vertebral arteries: Codominant. No evidence of dissection, stenosis (50% or greater) or occlusion. Skeleton: Negative. Other neck: Negative. Upper chest: Mild peripheral fibrosis of the upper lobes. Review of the MIP images confirms the above findings CTA HEAD FINDINGS Anterior circulation: No significant stenosis, proximal occlusion, aneurysm, or vascular malformation. Posterior circulation: No significant stenosis, proximal occlusion, aneurysm, or vascular malformation. Venous sinuses: As permitted by contrast timing, patent. Anatomic variants: Anterior communicating artery and right posterior communicating arteries are patent. No left posterior communicating artery identified, likely hypoplastic or absent. Delayed phase: No abnormal intracranial enhancement. Review of the MIP images  confirms the above findings IMPRESSION: 1. Patent carotid and vertebral arteries. No dissection, aneurysm, or hemodynamically significant stenosis utilizing NASCET criteria. 2. Patent anterior and posterior intracranial circulation. No large vessel occlusion, aneurysm, or significant stenosis. 3. Non stenotic beaded irregularity of mid and upper cervical internal carotid arteries compatible with fibromuscular dysplasia. 4. Negative noncontrast CT of the head. No abnormal enhancement after contrast administration. Electronically Signed   By: LKristine GarbeM.D.   On: 09/05/2018 03:15   Mr MVirgel PalingWNLContrast  Result Date: 08/21/2018 [facility] NEUROIMAGING REPORT STUDY DATE: 08/20/18 PATIENT NAME: Jill TIETJEDOB: [id number]: [id number] CLINICIAN: ASarina Ill MD CLINICAL HISTORY: 53year old female with headaches. EXAM: MRA head (without) TECHNIQUE: MR angiogram of the head was obtained utilizing 3D time of flight sequences from below the vertebrobasilar junction up to the intracranial vasculature without contrast.  Computerized reconstructions were obtained. CONTRAST: no COMPARISON: none IMAGING SITE: [facility] [address] (1.5 Tesla MRI) FINDINGS: This study is of adequate technical quality. Flow signal of the left internal carotid artery have no stenosis. The right internal carotid artery cavernous segment, has decreased flow signal.  Superiorly the clinoid, paraophthalmic and terminal right internal carotid artery segments have normal flow signal.  Normal flow signal in the right middle and anterior cerebral arteries.  This region of decreased flow signal may represent technical artifact versus focal stenosis. The bilateral middle and anterior cerebral arteries have no stenosis. The bilateral vertebral, basilar, bilateral posterior cerebral arteries have no stenosis. No aneurysmal dilatations are seen.   MRA head (without) demonstrating: - The  right internal carotid artery cavernous segment, has decreased flow signal.  Normal flow signal superiorly and into  the right middle and anterior cerebral arteries.  This region of decreased flow signal may represent technical artifact versus focal stenosis. - Remainder of medium to large sized vessels are unremarkable. INTERPRETING PHYSICIAN: Penni Bombard, MD Certified in Neurology, Neurophysiology and Neuroimaging [facility] [address] [place], [place] [address] [phone]   Mr Kizzie Fantasia Contrast  Result Date: 08/21/2018 [facility] NEUROIMAGING REPORT STUDY DATE: 08/20/18 PATIENT NAME: Jill Harper DOB: 1965/12/13 MRN: [id number] ORDERING CLINICIAN: Sarina Ill, MD CLINICAL HISTORY: 52 year old female with headache. EXAM: MRI brain (with and without) TECHNIQUE: MRI of the brain with and without contrast was obtained utilizing 5 mm axial slices with T1, T2, T2 flair, T2 star gradient echo and diffusion weighted views.  T1 sagittal, T2 coronal and postcontrast views in the axial and coronal plane were obtained. CONTRAST: 32m The patient is stable with multi-system diseases as per history noted above. h COMPARISON: 03/04/12 MRI, 09/07/15 CT IMAGING SITE: [facility] [address] (1.5 Tesla MRI) FINDINGS: No abnormal lesions are seen on diffusion-weighted views to suggest acute ischemia. The cortical sulci, fissures and cisterns are normal in size and appearance. Lateral, third and fourth ventricle are normal in size and appearance. No extra-axial fluid collections are seen. No evidence of mass effect or midline shift.  Minimal periventricular and subcortical non-specific gliosis. No abnormal lesions are seen on post contrast views.  On sagittal views the posterior fossa, pituitary gland and corpus callosum are unremarkable. No evidence of intracranial hemorrhage on gradient-echo views. The orbits and their contents, paranasal sinuses and  calvarium are unremarkable.  Intracranial flow voids are present.   Unremarkable MRI brain (with and without). No acute findings. INTERPRETING PHYSICIAN: VPenni Bombard MD Certified in Neurology, Neurophysiology and Neuroimaging [facility] [id number] [place] [phone]   Recent Labs: Lab Results  Component Value Date   WBC 8.6 06/26/2018   HGB 13.5 06/26/2018   PLT 287 06/26/2018   NA 143 06/26/2018   K 4.3 06/26/2018   CL 108 06/26/2018   CO2 27 06/26/2018   GLUCOSE 91 06/26/2018   BUN 14 06/26/2018   CREATININE 0.73 06/26/2018   BILITOT 0.3 06/26/2018   ALKPHOS 141 (H) 01/31/2017   AST 19 06/26/2018   ALT 14 06/26/2018   PROT 7.2 06/26/2018   ALBUMIN 3.6 01/31/2017   CALCIUM 9.3 06/26/2018   GFRAA 110 06/26/2018    Speciality Comments: No specialty comments available.  Procedures:  No procedures performed Allergies: Sulfonamide derivatives   Assessment / Plan:     Visit Diagnoses: Rheumatoid arthritis involving multiple sites with positive rheumatoid factor (HCC) - +RF, +CCP  High risk medication use  - Imuran, dc MTX due to progression of ILD  Polymyositis (HCC) - Dx by Dr. TCharlestine Night2007, CK 801, Jo-1 positive: She has 5 out of 5 muscle strength of upper and lower extremities.  Jo-1 antibody positive  [place] disease[place] chest CT 04/15/2018 that revealed further progression of ILD since 2017 comparison CT.  Her pulmonologist recommended switching from MTX to Imuran  Primary osteoarthritis of both hands  Primary osteoarthritis of both feet  Fibromyalgia  Erythema nodosum  Bronchiectasis without complication (HCC)  History of migraine  History of mitral valve prolapse  History of vitamin D deficiency  Other fatigue   Orders: No orders of the defined types were placed in this encounter.  No orders of the defined types  were placed in this encounter.   Face-to-face time  spent with patient was *** minutes. Greater than 50% of time was spent in counseling and coordination of care.  Follow-Up Instructions: No follow-ups on file.   Ofilia Neas, PA-C  Note - This record has been created using Dragon software.  Chart creation errors have been sought, but may not always  have been located. Such creation errors do not reflect on  the standard of medical care.

## 2018-09-17 ENCOUNTER — Other Ambulatory Visit: Payer: Self-pay | Admitting: Student

## 2018-09-17 ENCOUNTER — Other Ambulatory Visit: Payer: Self-pay | Admitting: Radiology

## 2018-09-18 ENCOUNTER — Other Ambulatory Visit (HOSPITAL_COMMUNITY): Payer: Self-pay | Admitting: Interventional Radiology

## 2018-09-18 ENCOUNTER — Ambulatory Visit (HOSPITAL_COMMUNITY)
Admission: RE | Admit: 2018-09-18 | Discharge: 2018-09-18 | Disposition: A | Discharge status: Home / Self Care | Payer: Federal, State, Local not specified - PPO | Source: Ambulatory Visit | Attending: Interventional Radiology | Admitting: Interventional Radiology

## 2018-09-18 DIAGNOSIS — Z8261 Family history of arthritis: Secondary | ICD-10-CM | POA: Diagnosis not present

## 2018-09-18 DIAGNOSIS — I773 Arterial fibromuscular dysplasia: Secondary | ICD-10-CM

## 2018-09-18 DIAGNOSIS — Z79899 Other long term (current) drug therapy: Secondary | ICD-10-CM | POA: Diagnosis not present

## 2018-09-18 DIAGNOSIS — M797 Fibromyalgia: Secondary | ICD-10-CM | POA: Diagnosis not present

## 2018-09-18 DIAGNOSIS — M069 Rheumatoid arthritis, unspecified: Secondary | ICD-10-CM | POA: Insufficient documentation

## 2018-09-18 DIAGNOSIS — Z882 Allergy status to sulfonamides status: Secondary | ICD-10-CM | POA: Insufficient documentation

## 2018-09-18 DIAGNOSIS — Z8249 Family history of ischemic heart disease and other diseases of the circulatory system: Secondary | ICD-10-CM | POA: Diagnosis not present

## 2018-09-18 HISTORY — PX: IR ANGIO INTRA EXTRACRAN SEL COM CAROTID INNOMINATE BILAT MOD SED: IMG5360

## 2018-09-18 HISTORY — PX: IR US GUIDE VASC ACCESS RIGHT: IMG2390

## 2018-09-18 HISTORY — PX: IR ANGIO VERTEBRAL SEL VERTEBRAL BILAT MOD SED: IMG5369

## 2018-09-18 LAB — CBC
HCT: 42.5 % (ref 36.0–46.0)
Hemoglobin: 13.8 g/dL (ref 12.0–15.0)
MCH: 27.5 pg (ref 26.0–34.0)
MCHC: 32.5 g/dL (ref 30.0–36.0)
MCV: 84.8 fL (ref 80.0–100.0)
Platelets: 292 10*3/uL (ref 150–400)
RBC: 5.01 MIL/uL (ref 3.87–5.11)
RDW: 12.7 % (ref 11.5–15.5)
WBC: 7.6 10*3/uL (ref 4.0–10.5)
nRBC: 0 % (ref 0.0–0.2)

## 2018-09-18 LAB — BASIC METABOLIC PANEL
Anion gap: 12 (ref 5–15)
BUN: 9 mg/dL (ref 6–20)
CO2: 22 mmol/L (ref 22–32)
Calcium: 9.5 mg/dL (ref 8.9–10.3)
Chloride: 105 mmol/L (ref 98–111)
Creatinine, Ser: 0.74 mg/dL (ref 0.44–1.00)
GFR calc Af Amer: >60 mL/min (ref >60)
GFR calc non Af Amer: >60 mL/min (ref >60)
Glucose, Bld: 118 mg/dL — ABNORMAL HIGH (ref 70–99)
Potassium: 4 mmol/L (ref 3.5–5.1)
Sodium: 139 mmol/L (ref 135–145)

## 2018-09-18 LAB — PROTIME-INR
INR: 1.03
Prothrombin Time: 13.5 seconds (ref 11.4–15.2)

## 2018-09-18 MED ORDER — HEPARIN SODIUM (PORCINE) 1000 UNIT/ML IJ SOLN
INTRAMUSCULAR | Status: AC
  Filled 2018-09-18: qty 1

## 2018-09-18 MED ORDER — SODIUM CHLORIDE 0.9 % IV SOLN
INTRAVENOUS | Status: AC
  Administered 2018-09-18: 11:00:00 via INTRAVENOUS

## 2018-09-18 MED ORDER — LIDOCAINE HCL 1 % IJ SOLN
INTRAMUSCULAR | Status: AC | PRN
  Administered 2018-09-18: 5 mL

## 2018-09-18 MED ORDER — FENTANYL CITRATE (PF) 100 MCG/2ML IJ SOLN
INTRAMUSCULAR | Status: AC
  Filled 2018-09-18: qty 2

## 2018-09-18 MED ORDER — LIDOCAINE-PRILOCAINE 2.5-2.5 % EX CREA
TOPICAL_CREAM | Freq: Once | CUTANEOUS | Status: DC
  Filled 2018-09-18: qty 5

## 2018-09-18 MED ORDER — IOPAMIDOL (ISOVUE-300) INJECTION 61%
INTRAVENOUS | Status: AC
  Administered 2018-09-18: 10 mL
  Filled 2018-09-18: qty 50

## 2018-09-18 MED ORDER — IOHEXOL 300 MG/ML  SOLN
150.0000 mL | Freq: Once | INTRAMUSCULAR | Status: AC | PRN
  Administered 2018-09-18: 80 mL via INTRA_ARTERIAL

## 2018-09-18 MED ORDER — VERAPAMIL HCL 2.5 MG/ML IV SOLN
INTRAVENOUS | Status: AC
  Filled 2018-09-18: qty 2

## 2018-09-18 MED ORDER — ACETAMINOPHEN 325 MG PO TABS
325.0000 mg | ORAL_TABLET | Freq: Four times a day (QID) | ORAL | Status: DC | PRN
  Administered 2018-09-18: 325 mg via ORAL
  Filled 2018-09-18: qty 1

## 2018-09-18 MED ORDER — MIDAZOLAM HCL 2 MG/2ML IJ SOLN
INTRAMUSCULAR | Status: AC
  Filled 2018-09-18: qty 2

## 2018-09-18 MED ORDER — MIDAZOLAM HCL 2 MG/2ML IJ SOLN
INTRAMUSCULAR | Status: AC | PRN
  Administered 2018-09-18: 1 mg via INTRAVENOUS

## 2018-09-18 MED ORDER — FENTANYL CITRATE (PF) 100 MCG/2ML IJ SOLN
INTRAMUSCULAR | Status: AC | PRN
  Administered 2018-09-18: 25 ug via INTRAVENOUS

## 2018-09-18 MED ORDER — SODIUM CHLORIDE 0.9 % IV SOLN: Freq: Once | INTRAVENOUS | Status: DC

## 2018-09-18 MED ORDER — ACETAMINOPHEN 325 MG PO TABS
ORAL_TABLET | ORAL | Status: AC
  Administered 2018-09-18: 325 mg via ORAL
  Filled 2018-09-18: qty 1

## 2018-09-18 MED ORDER — NITROGLYCERIN 1 MG/10 ML FOR IR/CATH LAB
INTRA_ARTERIAL | Status: AC
  Filled 2018-09-18: qty 10

## 2018-09-18 MED ORDER — LIDOCAINE HCL 1 % IJ SOLN
INTRAMUSCULAR | Status: AC
  Filled 2018-09-18: qty 20

## 2018-09-18 NOTE — Discharge Instructions (Addendum)
Radial Site Care ° °This sheet gives you information about how to care for yourself after your procedure. Your health care provider may also give you more specific instructions. If you have problems or questions, contact your health care provider. °What can I expect after the procedure? °After the procedure, it is common to have: °· Bruising and tenderness at the catheter insertion area. °Follow these instructions at home: °Medicines °· Take over-the-counter and prescription medicines only as told by your health care provider. °Insertion site care °· Follow instructions from your health care provider about how to take care of your insertion site. Make sure you: °? Wash your hands with soap and water before you change your bandage (dressing). If soap and water are not available, use hand sanitizer. °? Change your dressing as told by your health care provider. °? Leave stitches (sutures), skin glue, or adhesive strips in place. These skin closures may need to stay in place for 2 weeks or longer. If adhesive strip edges start to loosen and curl up, you may trim the loose edges. Do not remove adhesive strips completely unless your health care provider tells you to do that. °· Check your insertion site every day for signs of infection. Check for: °? Redness, swelling, or pain. °? Fluid or blood. °? Pus or a bad smell. °? Warmth. °· Do not take baths, swim, or use a hot tub until your health care provider approves. °· You may shower 24-48 hours after the procedure, or as directed by your health care provider. °? Remove the dressing and gently wash the site with plain soap and water. °? Pat the area dry with a clean towel. °? Do not rub the site. That could cause bleeding. °· Do not apply powder or lotion to the site. °Activity ° °· For 24 hours after the procedure, or as directed by your health care provider: °? Do not flex or bend the affected arm. °? Do not push or pull heavy objects with the affected arm. °? Do not  drive yourself home from the hospital or clinic. You may drive 24 hours after the procedure unless your health care provider tells you not to. °? Do not operate machinery or power tools. °· Do not lift anything that is heavier than 10 lb (4.5 kg), or the limit that you are told, until your health care provider says that it is safe. °· Ask your health care provider when it is okay to: °? Return to work or school. °? Resume usual physical activities or sports. °? Resume sexual activity. °General instructions °· If the catheter site starts to bleed, raise your arm and put firm pressure on the site. If the bleeding does not stop, get help right away. This is a medical emergency. °· If you went home on the same day as your procedure, a responsible adult should be with you for the first 24 hours after you arrive home. °· Keep all follow-up visits as told by your health care provider. This is important. °Contact a health care provider if: °· You have a fever. °· You have redness, swelling, or yellow drainage around your insertion site. °Get help right away if: °· You have unusual pain at the radial site. °· The catheter insertion area swells very fast. °· The insertion area is bleeding, and the bleeding does not stop when you hold steady pressure on the area. °· Your arm or hand becomes pale, cool, tingly, or numb. °These symptoms may represent a serious problem   that is an emergency. Do not wait to see if the symptoms will go away. Get medical help right away. Call your local emergency services (911 in the [place] Do not drive yourself to the hospital. °Summary °· After the procedure, it is common to have bruising and tenderness at the site. °· Follow instructions from your health care provider about how to take care of your radial site wound. Check the wound every day for signs of infection. °· Do not lift anything that is heavier than 10 lb (4.5 kg), or the limit that you are told, until your health care provider says  that it is safe. °This information is not intended to replace advice given to you by your health care provider. Make sure you discuss any questions you have with your health care provider. °Document Released: 08/25/2010 Document Revised: 08/28/2017 Document Reviewed: 08/28/2017 °[employer] Interactive Patient Education © 2019 [employer] °Cerebral Angiogram ° °A cerebral angiogram is a procedure that is used to examine the blood vessels in the brain and neck. In this procedure, contrast dye is injected through a long, thin tube (catheter) into an artery. X-rays are then taken, which show if there is a blockage or problem in a blood vessel. °Tell a health care provider about: °· Any allergies you have, including allergies to shellfish, contrast dye, or iodine °· All medicines you are taking, including vitamins, herbs, eye drops, creams, and over-the-counter medicines. °· Any problems you or family members have had with anesthetic medicines. °· Any blood disorders you have. °· Any surgeries you have had. °· Any medical conditions you have. °· Whether you are pregnant or may be pregnant. °· Whether you are currently breastfeeding. °What are the risks? °Generally, this is a safe procedure. However, problems may occur, including: °· Damage to surrounding nerves, tissues, or structures. °· Blood clot. °· Inability to remember what happened (amnesia). This is usually temporary. °· Weakness, numbness, speech, or vision problems. This is usually temporary. °· Stroke. °· Kidney injury. °· Bleeding or bruising. °· Allergic reaction medicines or dyes. °· Infection. °What happens before the procedure? °Staying hydrated °Follow instructions from your health care provider about hydration, which may include: °· Up to 2 hours before the procedure - you may continue to drink clear liquids, such as water, clear fruit juice, black coffee, and plain tea. °Eating and drinking restrictions °Follow instructions from your health care  provider about eating and drinking, which may include: °· 8 hours before the procedure - stop eating heavy meals or foods such as meat, fried foods, or fatty foods. °· 6 hours before the procedure - stop eating light meals or foods, such as toast or cereal. °· 6 hours before the procedure - stop drinking milk or drinks that contain milk. °· 2 hours before the procedure - stop drinking clear liquids. °General instructions °· Ask your health care provider about: °? Changing or stopping your regular medicines. This is especially important if you are taking diabetes medicines or blood thinners. °? Taking medicines such as aspirin or ibuprofen. These medicines can thin your blood. Do not take these medicines before your procedure if your health care provider asks you not to. °· You may have blood tests done. °· Plan to have someone take you home from the hospital or clinic. °· If you will be going home right after the procedure, plan to have someone with you for 24 hours. °What happens during the procedure? °· To reduce your risk of infection: °? Your health   care team will wash or sanitize their hands. °? Your skin will be washed with soap. °? Hair may be removed from the surgical area. °· You will lie on your back on an imaging bed with an X-ray machine around you. °· Your head will be secured to the bed with a strap or device to help you keep still. °· An IV tube will be inserted into one of your veins. °· You will be given one or more of the following: °? A medicine to help you relax (sedative). °? A medicine to numb the area (local anesthetic) where the catheter will be inserted. This is usually your groin, leg, or arm. °· Your heart rate and other vital signs will be watched carefully. You may have electrodes placed on your chest. °· A small cut (incision) will be made where the catheter will be inserted. °· The catheter will be inserted into an artery that leads to the head. You may feel slight pressure. °· The  catheter will be moved through the body up to your neck and brain. X-ray images will help your health care provider bring the catheter to the correct location. °· The dye will be injected into the catheter and will travel to your head or neck area. You may feel a warming or burning sensation or notice a strange taste in your mouth as the dye goes through your system. °· Images will be taken to show how the dye flows through the area. °· While the images are being taken, you may be given instructions on breathing, swallowing, moving, or talking. °· When the images are finished, the catheter will be slowly removed. °· Pressure will be applied to the skin to stop any bleeding. A tight bandage (dressing) or seal will be applied to the skin. °· Your IV will be removed. °The procedure may vary among health care providers and hospitals. °What happens after the procedure? °· Your blood pressure, heart rate, breathing rate, and blood oxygen level will be monitored until the medicines you were given have worn off. °· You will be asked to lie flat for several hours. The arm or leg where the catheter was inserted will need to be kept straight while you are in the recovery room. °· The insertion site will be watched for bleeding and you will be checked often. °· You will be instructed to drink plenty of fluids. This will help wash the contrast dye out of your system. °· Do not drive for 24 hours if you received a sedative. °· It is up to you to get the results of your procedure. Ask your health care provider, or the department that is doing the procedure, when your results will be ready. °Summary °· A cerebral angiogram is a procedure that is used to examine the blood vessels in the brain and neck. °· In this procedure, contrast dye is injected through a long, thin tube (catheter) into an artery. X-rays are then taken, which show if there is a blockage or problem in a blood vessel. °· You will be given a sedative to help you  relax during the procedure. A local anesthetic will be used to numb the area where the catheter is inserted. You may feel pressure when the catheter is inserted, and you may feel a warm sensation when the dye is injected. °· After the procedure, you will be asked to lie flat for several hours. The arm or leg where the catheter was inserted will need to be kept   straight while you are in the recovery room. °This information is not intended to replace advice given to you by your health care provider. Make sure you discuss any questions you have with your health care provider. °Document Released: 12/07/2013 Document Revised: 08/27/2016 Document Reviewed: 08/27/2016 °[employer] Interactive Patient Education © 2019 [employer] °Moderate Conscious Sedation, Adult, Care After °These instructions provide you with information about caring for yourself after your procedure. Your health care provider may also give you more specific instructions. Your treatment has been planned according to current medical practices, but problems sometimes occur. Call your health care provider if you have any problems or questions after your procedure. °What can I expect after the procedure? °After your procedure, it is common: °· To feel sleepy for several hours. °· To feel clumsy and have poor balance for several hours. °· To have poor judgment for several hours. °· To vomit if you eat too soon. °Follow these instructions at home: °For at least 24 hours after the procedure: ° °· Do not: °? Participate in activities where you could fall or become injured. °? Drive. °? Use heavy machinery. °? Drink alcohol. °? Take sleeping pills or medicines that cause drowsiness. °? Make important decisions or sign legal documents. °? Take care of children on your own. °· Rest. °Eating and drinking °· Follow the diet recommended by your health care provider. °· If you vomit: °? Drink water, juice, or soup when you can drink without vomiting. °? Make sure you  have little or no nausea before eating solid foods. °General instructions °· Have a responsible adult stay with you until you are awake and alert. °· Take over-the-counter and prescription medicines only as told by your health care provider. °· If you smoke, do not smoke without supervision. °· Keep all follow-up visits as told by your health care provider. This is important. °Contact a health care provider if: °· You keep feeling nauseous or you keep vomiting. °· You feel light-headed. °· You develop a rash. °· You have a fever. °Get help right away if: °· You have trouble breathing. °This information is not intended to replace advice given to you by your health care provider. Make sure you discuss any questions you have with your health care provider. °Document Released: 05/13/2013 Document Revised: 12/26/2015 Document Reviewed: 11/12/2015 °[employer] Interactive Patient Education © 2019 [employer] ° °

## 2018-09-18 NOTE — H&P (Signed)
Chief Complaint: Patient was seen in consultation today for cerebral arteriogram at the request of [facility]  Referring Physician(s): Deveshwar,Sanjeev  Supervising Physician: Luanne Bras  Patient Status: [facility] - Out-pt  History of Present Illness: Jill Harper is a 53 y.o. female who was worked up for complaint of headaches, but was found to have possible carotid stenosis. She is referred for formal cerebral arteriogram. PMHx, meds, labs, imaging, allergies reviewed. Feels well, no recent fevers, chills, illness. Has been NPO today as directed. Family at bedside.   Past Medical History:  Diagnosis Date  . Abnormal liver function test   . Bronchiectasis       . Fibromyalgia   . Heart murmur   . Menorrhagia   . MIGRAINE HEADACHE   . MVP (mitral valve prolapse)   . Polymyositis ([place])    Dr [name]; chronic MTX  . Pulmonary fibrosis ([place])   . Rheumatoid arthritis [facility])     Past Surgical History:  Procedure Laterality Date  . ablation uterine  2010  . CARDIAC CATHETERIZATION  2002   normal  . DILATION AND CURETTAGE OF UTERUS  08-31-08   Dr [name]    Allergies: Sulfonamide derivatives  Medications: Prior to Admission medications   Medication Sig Start Date End Date Taking? Authorizing Provider  albuterol (PROVENTIL HFA) 108 (90 Base) MCG/ACT inhaler Inhale 2 puffs into the lungs 4 (four) times daily as needed. Reported on 09/03/2015 10/10/15  Yes Parrett, Tammy S, NP  albuterol (PROVENTIL) (2.5 MG/3ML) 0.083% nebulizer solution Take 3 mLs (2.5 mg total) by nebulization every 6 (six) hours as needed for wheezing or shortness of breath. 05/16/18  Yes Chesley Mires, MD  atenolol (TENORMIN) 25 MG tablet TAKE 1/2 TABLET BY MOUTH DAILY AS NEEDED FOR SYMPTOMS OF MITRAL PROLAPSE Patient taking differently: Take 12.5 mg by mouth daily as needed (mitral prolapse).  06/02/18  Yes Burns, Claudina Lick, MD  azaTHIOprine (IMURAN) 50 MG tablet Take 2  tablets (100 mg total) by mouth daily. 07/08/18  Yes Deveshwar, Abel Presto, MD  cyclobenzaprine (FLEXERIL) 10 MG tablet Take 2.5 mg by mouth at bedtime as needed for muscle spasms. Reported on 09/03/2015   Yes [provider]  diclofenac sodium (VOLTAREN) 1 % GEL Apply 2 g topically 4 (four) times daily as needed (pain). 01/31/17  Yes Deveshwar, Abel Presto, MD  eletriptan (RELPAX) 20 MG tablet One tablet by mouth at onset of headache. May repeat in 2 hours if headache persists or recurs. 02/26/12  Yes Rowe Clack, MD  fluticasone furoate-vilanterol (BREO ELLIPTA) 100-25 MCG/INH AEPB Inhale 1 puff into the lungs daily. 01/15/18  Yes [name], NP  folic acid (FOLVITE) 1 MG tablet Take 2 tablets (2 mg total) by mouth daily. 02/18/18  Yes Ofilia Neas, PA-C  guaiFENesin (MUCINEX) 600 MG 12 hr tablet Take 2 tablets (1,200 mg total) by mouth 2 (two) times daily as needed for cough or to loosen phlegm. Patient taking differently: Take 1,200 mg by mouth 2 (two) times daily.  05/16/18  Yes Chesley Mires, MD  Multiple Vitamins-Minerals (MULTIVITAMIN GUMMIES ADULT PO) Take 2 each by mouth daily.   Yes [provider]  Respiratory Therapy Supplies (FLUTTER) DEVI Use as directed Patient taking differently: 4 (four) times daily. Use as directed 09/21/15  Yes Tanda Rockers, MD  traMADol (ULTRAM) 50 MG tablet Take 50 mg by mouth every 6 (six) hours as needed for moderate pain. Reported on 09/03/2015   Yes [provider]  Vitamin D, Ergocalciferol, (DRISDOL) 50000 units CAPS capsule Take 1 capsule (50,000 Units total) by mouth every 30 (thirty) days. 04/04/18  Yes Bo Merino, MD     Family History  Problem Relation Age of Onset  . Diabetes Mother   . Fibromyalgia Mother   . Ulcers Mother   . Heart failure Mother   . Multiple myeloma Father   . Lupus Sister   . Other Sister        abdominal adhesions resulting in bowel obstruction  . Migraines Sister   . Heart disease  Brother        bypass surgery  . Rheum arthritis Sister   . Multiple myeloma Sister   . Hypertension Sister   . Heart attack Sister   . Diabetes Sister   . Hypertension Sister   . Rheum arthritis Sister   . Diabetes Sister   . Diabetes Brother   . Headache Other        siblings with headaches but not diagnosed as migraines    Social History   Socioeconomic History  . Marital status: Married    Spouse name: Not on file  . Number of children: 1  . Years of education: Not on file  . Highest education level: High school graduate  Occupational History  . 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 Financial resource strain: Not on file  . Food insecurity:    Worry: Not on file    Inability: Not on file  . Transportation needs:    Medical: Not on file    Non-medical: Not on file  Tobacco Use  . Smoking status: Never Smoker  . Smokeless tobacco: Never Used  . Tobacco comment: Married, lives with spouse. 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 Alcohol use: Never    Frequency: Never    Comment: maybe a wine cooler once every other year  . Drug use: Never  . Sexual activity: Yes    Birth control/protection: Surgical  Lifestyle  . Physical activity:    Days per week: Not on file    Minutes per session: Not on file  . Stress: Not on file  Relationships  . Social connections:    Talks on phone: Not on file    Gets together: Not on file    Attends religious service: Not on file    Active member of club or organization: Not on file    Attends meetings of clubs or organizations: Not on file    Relationship status: Not on file  Other Topics Concern  . Not on file  Social History Narrative   Exercise: trying to walk - limited by fatigue   Lives at home with husband and daughter   Right handed   Caffeine: occasional coke 1-2x per month     Review of Systems: A  12 point ROS discussed and pertinent positives are indicated in the HPI above.  All other systems are negative.  Review of Systems  Vital Signs: BP 131/68   Pulse 77   Temp 97.9 F (36.6 C) (Oral)   Resp 20   Ht 5' 8"  (1.727 m)   Wt 122.5 kg   SpO2 97%   BMI 41.05 kg/m   Physical Exam Constitutional:      Appearance: Normal appearance.  HENT:     Head: Normocephalic.     Mouth/Throat:  Mouth: Mucous membranes are moist.     Pharynx: Oropharynx is clear.  Neck:     Musculoskeletal: Normal range of motion and neck supple.     Vascular: No carotid bruit.  Cardiovascular:     Rate and Rhythm: Normal rate and regular rhythm.     Pulses: Normal pulses.     Heart sounds: Normal heart sounds.  Pulmonary:     Effort: Pulmonary effort is normal. No respiratory distress.     Breath sounds: Normal breath sounds.  Abdominal:     General: Abdomen is flat. There is no distension.     Palpations: Abdomen is soft.     Tenderness: There is no abdominal tenderness.  Skin:    General: Skin is warm and dry.     Findings: No rash.  Neurological:     General: No focal deficit present.     Mental Status: She is alert and oriented to person, place, and time. Mental status is at baseline.     Cranial Nerves: No cranial nerve deficit.     Sensory: No sensory deficit.     Motor: No weakness.  Psychiatric:        Mood and Affect: Mood normal.        Judgment: Judgment normal.     Imaging: Ct Angio Head W Or Wo Contrast  Result Date: 09/05/2018 CLINICAL DATA:  53 y/o F; right carotid stenosis. Headaches and lightheadedness. EXAM: CT ANGIOGRAPHY HEAD AND NECK TECHNIQUE: Multidetector CT imaging of the head and neck was performed using the standard protocol during bolus administration of intravenous contrast. Multiplanar CT image reconstructions and MIPs were obtained to evaluate the vascular anatomy. Carotid stenosis measurements (when applicable) are obtained utilizing NASCET criteria,  using the distal internal carotid diameter as the denominator. CONTRAST:  153m ISOVUE-370 IOPAMIDOL (ISOVUE-370) INJECTION 76% COMPARISON:  08/20/2018 MRI and MRA of the head. FINDINGS: CT HEAD FINDINGS Brain: No evidence of acute infarction, hemorrhage, hydrocephalus, extra-axial collection or mass lesion/mass effect. Vascular: As below. Skull: Normal. Negative for fracture or focal lesion. Sinuses: Imaged portions are clear. Orbits: No acute finding. Review of the MIP images confirms the above findings CTA NECK FINDINGS Aortic arch: Standard branching. Imaged portion shows no evidence of aneurysm or dissection. No significant stenosis of the major arch vessel origins. Right carotid system: No evidence of dissection, stenosis (50% or greater) or occlusion. Non stenotic beaded irregularity of mid and upper internal carotid artery (series 12, image 21). Left carotid system: No evidence of dissection, stenosis (50% or greater) or occlusion. Non stenotic beaded irregularity of mid and upper internal carotid artery (series 12, image 22). Vertebral arteries: Codominant. No evidence of dissection, stenosis (50% or greater) or occlusion. Skeleton: Negative. Other neck: Negative. Upper chest: Mild peripheral fibrosis of the upper lobes. Review of the MIP images confirms the above findings CTA HEAD FINDINGS Anterior circulation: No significant stenosis, proximal occlusion, aneurysm, or vascular malformation. Posterior circulation: No significant stenosis, proximal occlusion, aneurysm, or vascular malformation. Venous sinuses: As permitted by contrast timing, patent. Anatomic variants: Anterior communicating artery and right posterior communicating arteries are patent. No left posterior communicating artery identified, likely hypoplastic or absent. Delayed phase: No abnormal intracranial enhancement. Review of the MIP images confirms the above findings IMPRESSION: 1. Patent carotid and vertebral arteries. No dissection,  aneurysm, or hemodynamically significant stenosis utilizing NASCET criteria. 2. Patent anterior and posterior intracranial circulation. No large vessel occlusion, aneurysm, or significant stenosis. 3. Non stenotic beaded irregularity of mid and upper  cervical internal carotid arteries compatible with fibromuscular dysplasia. 4. Negative noncontrast CT of the head. No abnormal enhancement after contrast administration. Electronically Signed   By: Kristine Garbe M.D.   On: 09/05/2018 03:15   Ct Angio Neck W Or Wo Contrast  Result Date: 09/05/2018 CLINICAL DATA:  53 y/o F; right carotid stenosis. Headaches and lightheadedness. EXAM: CT ANGIOGRAPHY HEAD AND NECK TECHNIQUE: Multidetector CT imaging of the head and neck was performed using the standard protocol during bolus administration of intravenous contrast. Multiplanar CT image reconstructions and MIPs were obtained to evaluate the vascular anatomy. Carotid stenosis measurements (when applicable) are obtained utilizing NASCET criteria, using the distal internal carotid diameter as the denominator. CONTRAST:  123m ISOVUE-370 IOPAMIDOL (ISOVUE-370) INJECTION 76% COMPARISON:  08/20/2018 MRI and MRA of the head. FINDINGS: CT HEAD FINDINGS Brain: No evidence of acute infarction, hemorrhage, hydrocephalus, extra-axial collection or mass lesion/mass effect. Vascular: As below. Skull: Normal. Negative for fracture or focal lesion. Sinuses: Imaged portions are clear. Orbits: No acute finding. Review of the MIP images confirms the above findings CTA NECK FINDINGS Aortic arch: Standard branching. Imaged portion shows no evidence of aneurysm or dissection. No significant stenosis of the major arch vessel origins. Right carotid system: No evidence of dissection, stenosis (50% or greater) or occlusion. Non stenotic beaded irregularity of mid and upper internal carotid artery (series 12, image 21). Left carotid system: No evidence of dissection, stenosis (50% or  greater) or occlusion. Non stenotic beaded irregularity of mid and upper internal carotid artery (series 12, image 22). Vertebral arteries: Codominant. No evidence of dissection, stenosis (50% or greater) or occlusion. Skeleton: Negative. Other neck: Negative. Upper chest: Mild peripheral fibrosis of the upper lobes. Review of the MIP images confirms the above findings CTA HEAD FINDINGS Anterior circulation: No significant stenosis, proximal occlusion, aneurysm, or vascular malformation. Posterior circulation: No significant stenosis, proximal occlusion, aneurysm, or vascular malformation. Venous sinuses: As permitted by contrast timing, patent. Anatomic variants: Anterior communicating artery and right posterior communicating arteries are patent. No left posterior communicating artery identified, likely hypoplastic or absent. Delayed phase: No abnormal intracranial enhancement. Review of the MIP images confirms the above findings IMPRESSION: 1. Patent carotid and vertebral arteries. No dissection, aneurysm, or hemodynamically significant stenosis utilizing NASCET criteria. 2. Patent anterior and posterior intracranial circulation. No large vessel occlusion, aneurysm, or significant stenosis. 3. Non stenotic beaded irregularity of mid and upper cervical internal carotid arteries compatible with fibromuscular dysplasia. 4. Negative noncontrast CT of the head. No abnormal enhancement after contrast administration. Electronically Signed   By: LKristine GarbeM.D.   On: 09/05/2018 03:15   Mr MVirgel PalingWUTContrast  Result Date: 08/21/2018 [facility] NEUROIMAGING REPORT STUDY DATE: 08/20/18 PATIENT NAME: DEMALINA DUBREUILDOB: 4October 08, 1967MRN: [id number] CLINICIAN: ASarina Ill MD CLINICAL HISTORY: 53year old female with headaches. EXAM: MRA head (without) TECHNIQUE: MR angiogram of the head was obtained utilizing 3D time of flight sequences from below the vertebrobasilar junction up to  the intracranial vasculature without contrast.  Computerized reconstructions were obtained. CONTRAST: no COMPARISON: none IMAGING SITE: [facility] [address] (1.5 Tesla MRI) FINDINGS: This study is of adequate technical quality. Flow signal of the left internal carotid artery have no stenosis. The right internal carotid artery cavernous segment, has decreased flow signal.  Superiorly the clinoid, paraophthalmic and terminal right internal carotid artery segments have normal flow signal.  Normal flow signal in the right middle and anterior cerebral arteries.  This region of  decreased flow signal may represent technical artifact versus focal stenosis. The bilateral middle and anterior cerebral arteries have no stenosis. The bilateral vertebral, basilar, bilateral posterior cerebral arteries have no stenosis. No aneurysmal dilatations are seen.   MRA head (without) demonstrating: - The right internal carotid artery cavernous segment, has decreased flow signal.  Normal flow signal superiorly and into the right middle and anterior cerebral arteries.  This region of decreased flow signal may represent technical artifact versus focal stenosis. - Remainder of medium to large sized vessels are unremarkable. INTERPRETING PHYSICIAN: Penni Bombard, MD Certified in Neurology, Neurophysiology and Neuroimaging [facility] [address], [place], [place] [address] [phone]   Mr Kizzie Fantasia Contrast  Result Date: 08/21/2018 [facility] NEUROIMAGING REPORT STUDY DATE: 08/20/18 PATIENT NAME: Jill Harper DOB: February 20, 1966 MRN: [id number] ORDERING CLINICIAN: Sarina Ill, MD CLINICAL HISTORY: 53 year old female with headache. EXAM: MRI brain (with and without) TECHNIQUE: MRI of the brain with and without contrast was obtained utilizing 5 mm axial slices with T1, T2, T2 flair, T2 star gradient echo and diffusion weighted views.  T1 sagittal, T2 coronal and  postcontrast views in the axial and coronal plane were obtained. CONTRAST: 87m The patient is stable with multi-system diseases as per history noted above. h COMPARISON: 03/04/12 MRI, 09/07/15 CT IMAGING SITE: [facility] [address] (1.5 Tesla MRI) FINDINGS: No abnormal lesions are seen on diffusion-weighted views to suggest acute ischemia. The cortical sulci, fissures and cisterns are normal in size and appearance. Lateral, third and fourth ventricle are normal in size and appearance. No extra-axial fluid collections are seen. No evidence of mass effect or midline shift.  Minimal periventricular and subcortical non-specific gliosis. No abnormal lesions are seen on post contrast views.  On sagittal views the posterior fossa, pituitary gland and corpus callosum are unremarkable. No evidence of intracranial hemorrhage on gradient-echo views. The orbits and their contents, paranasal sinuses and calvarium are unremarkable.  Intracranial flow voids are present.   Unremarkable MRI brain (with and without). No acute findings. INTERPRETING PHYSICIAN: VPenni Bombard MD Certified in Neurology, Neurophysiology and Neuroimaging [facility] [address] [place] [phone]   Labs:  CBC: Recent Labs    02/18/18 1609 05/19/18 1446 06/26/18 1538 09/18/18 0640  WBC 6.7 8.2 8.6 7.6  HGB 13.8 14.2 13.5 13.8  HCT 41.3 43.0 41.0 42.5  PLT 304 261 287 292    COAGS: Recent Labs    09/18/18 0640  INR 1.03    BMP: Recent Labs    02/18/18 1609 05/19/18 1446 06/26/18 1538 09/18/18 0640  NA 138 141 143 139  K 4.5 4.4 4.3 4.0  CL 105 107 108 105  CO2 27 26 27 22   GLUCOSE 85 88 91 118*  BUN 11 9 14 9   CALCIUM 9.5 9.4 9.3 9.5  CREATININE 0.75 0.71 0.73 0.74  GFRNONAA 92 98 95 >60  GFRAA 106 113 110 >60    LIVER FUNCTION TESTS: Recent Labs    02/18/18 1609 04/03/18 1457 05/19/18 1446 06/26/18 1538  BILITOT 0.4 0.4 0.6 0.3    AST 44* 38* 20 19  ALT 42* 54* 19 14  PROT 7.4 7.9 7.4 7.2    TUMOR MARKERS: No results for input(s): AFPTM, CEA, CA199, CHROMGRNA in the last 8760 hours.  Assessment and Plan: Carotid stenosis For cerebral arteriogram Labs reviewed. Risks and benefits of cerebral arteriogram were discussed with the patient  including, but not limited to bleeding, infection, vascular injury or contrast induced renal failure.  This interventional procedure involves the use of X-rays and because of the nature of the planned procedure, it is possible that we will have prolonged use of X-ray fluoroscopy.  Potential radiation risks to you include (but are not limited to) the following: - A slightly elevated risk for cancer  several years later in life. This risk is typically less than 0.5% percent. This risk is low in comparison to the normal incidence of human cancer, which is 33% for women and 50% for men according to the [place]. - Radiation induced injury can include skin redness, resembling a rash, tissue breakdown / ulcers and hair loss (which can be temporary or permanent).   The likelihood of either of these occurring depends on the difficulty of the procedure and whether you are sensitive to radiation due to previous procedures, disease, or genetic conditions.   IF your procedure requires a prolonged use of radiation, you will be notified and given written instructions for further action.  It is your responsibility to monitor the irradiated area for the 2 weeks following the procedure and to notify your physician if you are concerned that you have suffered a radiation induced injury.    All of the patient's questions were answered, patient is agreeable to proceed.  Consent signed and in chart.  Thank you for this interesting consult.  I greatly enjoyed meeting Jill Harper and look forward to participating in their care.  A copy of this report was sent to the requesting provider on  this date.  Electronically Signed: Ascencion Dike, PA-C 09/18/2018, 8:18 AM   I spent a total of 20 minutes in face to face in clinical consultation, greater than 50% of which was counseling/coordinating care for cerebral angio

## 2018-09-18 NOTE — Procedures (Signed)
S/P 4 vessel cerebral arteriograms. RT radial approach. Findings. 1 moderate FMD like changes in bith mid cervical ICAs. 2.Severe accenteric stenosis of  Mid RT ICA on AP projection.

## 2018-09-19 ENCOUNTER — Encounter (HOSPITAL_COMMUNITY): Payer: Self-pay | Admitting: Interventional Radiology

## 2018-09-22 ENCOUNTER — Other Ambulatory Visit: Payer: Self-pay

## 2018-09-22 DIAGNOSIS — J849 Interstitial pulmonary disease, unspecified: Secondary | ICD-10-CM

## 2018-09-22 DIAGNOSIS — Z79899 Other long term (current) drug therapy: Secondary | ICD-10-CM | POA: Diagnosis not present

## 2018-09-22 NOTE — Telephone Encounter (Signed)
Order has been placed today for pulmonary rehab VS is out of the office this week, B.Mack spoke with MW to sign for this order, MW agreed.  Routing message to [facility] to make sure this can be scheduled soon.  [facility] please advise. Thank you.

## 2018-09-22 NOTE — Telephone Encounter (Signed)
09/22/2018 1640  Yes please place an order for pulmonary rehab for diagnosis of interstitial lung disease.  I thought that this was placed on 07/28/2018 when I saw the patient in office.  It will need to be placed under Dr. Juanetta Gosling name of the nurse practitioners cannot write for pulmonary rehab.  [name], FNP

## 2018-09-23 LAB — COMPLETE METABOLIC PANEL WITH GFR
AG Ratio: 1.3 (calc) (ref 1.0–2.5)
ALT: 15 U/L (ref 6–29)
AST: 21 U/L (ref 10–35)
Albumin: 4.4 g/dL (ref 3.6–5.1)
Alkaline phosphatase (APISO): 83 U/L (ref 37–153)
BUN: 9 mg/dL (ref 7–25)
CO2: 25 mmol/L (ref 20–32)
Calcium: 10 mg/dL (ref 8.6–10.4)
Chloride: 105 mmol/L (ref 98–110)
Creat: 0.74 mg/dL (ref 0.50–1.05)
GFR, Est African American: 108 mL/min/{1.73_m2} (ref >=60)
GFR, Est Non African American: 93 mL/min/{1.73_m2} (ref >=60)
GLOBULIN: 3.4 g/dL (ref 1.9–3.7)
Glucose, Bld: 84 mg/dL (ref 65–99)
Potassium: 4.3 mmol/L (ref 3.5–5.3)
SODIUM: 138 mmol/L (ref 135–146)
Total Bilirubin: 0.7 mg/dL (ref 0.2–1.2)
Total Protein: 7.8 g/dL (ref 6.1–8.1)

## 2018-09-23 LAB — CBC WITH DIFFERENTIAL/PLATELET
Absolute Monocytes: 600 cells/uL (ref 200–950)
Basophils Absolute: 40 cells/uL (ref 0–200)
Basophils Relative: 0.5 %
EOS PCT: 1.5 %
Eosinophils Absolute: 119 cells/uL (ref 15–500)
HCT: 44 % (ref 35.0–45.0)
Hemoglobin: 14.5 g/dL (ref 11.7–15.5)
LYMPHS ABS: 1509 {cells}/uL (ref 850–3900)
MCH: 28 pg (ref 27.0–33.0)
MCHC: 33 g/dL (ref 32.0–36.0)
MCV: 84.9 fL (ref 80.0–100.0)
MPV: 11 fL (ref 7.5–12.5)
Monocytes Relative: 7.6 %
Neutro Abs: 5633 cells/uL (ref 1500–7800)
Neutrophils Relative %: 71.3 %
Platelets: 313 10*3/uL (ref 140–400)
RBC: 5.18 10*6/uL — AB (ref 3.80–5.10)
RDW: 12.9 % (ref 11.0–15.0)
Total Lymphocyte: 19.1 %
WBC: 7.9 10*3/uL (ref 3.8–10.8)

## 2018-09-25 ENCOUNTER — Ambulatory Visit: Payer: Federal, State, Local not specified - PPO | Admitting: Physician Assistant

## 2018-09-25 ENCOUNTER — Telehealth (HOSPITAL_COMMUNITY): Payer: Self-pay

## 2018-09-25 ENCOUNTER — Encounter (INDEPENDENT_AMBULATORY_CARE_PROVIDER_SITE_OTHER): Payer: Federal, State, Local not specified - PPO

## 2018-09-25 NOTE — Telephone Encounter (Signed)
Pt insurance is active and benefits verified through [employer] $30.00, DED $0.00/0 met, out of pocket $5,500/$288.95 met, co-insurance 30%. no pre-authorization required, REF# [id number]  Tedra [place]. Support Rep II

## 2018-09-25 NOTE — Telephone Encounter (Signed)
Called patient to see if she was interested in participating in the Pulmonary Rehab Program. Patient stated yes. Patient will come in for orientation on 10/13/2018 @ 9:30am and will attend the 1:30pm exercise class.  Mailed homework package. Tedra [place]. Support Rep II

## 2018-09-26 NOTE — Progress Notes (Signed)
Office Visit Note  Patient: Jill Harper             Date of Birth: 03-May-1966           MRN: [id number]             PCP: Binnie Rail, MD Referring: Binnie Rail, MD Visit Date: 10/01/2018 Occupation: @GUAROCC @  Subjective:  Fatigue   History of Present Illness: Jill Harper is a 53 y.o. female with history of seropositive rheumatoid arthritis, polymyositis, osteoarthritis, and fibromyalgia.  She is on Imuran 100 mg po daily. She switched from MTX to Imuran due to progression of ILD.  She continues to have shortness of breath with exertion.  She has an appointment with [name] on October 27, 2018.  Patient states that [name] found a new murmur and referred her to cardiologist who diagnosed her with mitral valve prolapse.  She was also been experiencing intermittent headaches with exertion and was evaluated by Dr. Jaynee Eagles.  MRI and MRI were obtained on 08/20/2018.  Due to abnormalities found a CT of the head was also ordered.  She was scheduled for a cerebral angiogram on 09/18/2018 performed by Dr. Estanislado Pandy.  The diagnosis of FMD was confirmed.  She will have a repeat CT in 6 months.  She started taking aspirin 81 mg by mouth daily.  She reports that since her last visit she also had a home sleep study and was diagnosed with mild sleep apnea.  She has been using a CPAP machine for the past 1 month. She denies any recent rheumatoid arthritis flares.  She states that she has intermittent discomfort in bilateral wrist joints and bilateral shoulder joints.  She states that she is noticed increased muscle weakness for the past 1 week.  She states she is also noticed increased fatigue over the past week.   Activities of Daily Living:  Patient reports morning stiffness for2 minutes.   Patient Denies nocturnal pain.  Difficulty dressing/grooming: Denies Difficulty climbing stairs: Denies Difficulty getting out of chair: Denies Difficulty using hands for taps, buttons, cutlery, and/or writing:  Denies  Review of Systems  Constitutional: Positive for fatigue.  HENT: Negative for mouth sores, mouth dryness and nose dryness.   Eyes: Negative for pain, visual disturbance and dryness.  Respiratory: Positive for shortness of breath. Negative for cough, hemoptysis and difficulty breathing.   Cardiovascular: Negative for chest pain, palpitations, hypertension and swelling in legs/feet.  Gastrointestinal: Negative for blood in stool, constipation and diarrhea.  Endocrine: Negative for increased urination.  Genitourinary: Negative for painful urination.  Musculoskeletal: Positive for arthralgias, joint pain, joint swelling, myalgias, muscle weakness, morning stiffness, muscle tenderness and myalgias.  Skin: Negative for color change, pallor, rash, hair loss, nodules/bumps, skin tightness, ulcers and sensitivity to sunlight.  Allergic/Immunologic: Negative for susceptible to infections.  Neurological: Positive for headaches. Negative for dizziness, numbness and weakness.  Hematological: Positive for swollen glands.  Psychiatric/Behavioral: Negative for depressed mood and sleep disturbance. The patient is not nervous/anxious.     PMFS History:  Patient Active Problem List   Diagnosis Date Noted  . OSA (obstructive sleep apnea) 08/20/2018  . Rheumatoid arthritis ([place]) 04/17/2018  . ILD (interstitial lung disease) ([place]) 04/17/2018  . Dyspnea 04/01/2018  . Healthcare maintenance 04/01/2018  . Elevated TSH 03/18/2018  . Non-intractable vomiting without nausea 03/18/2018  . Dysphagia 03/18/2018  . Fever 09/13/2017  . Bronchiectasis ([place]) 09/02/2017  . LVH (left ventricular hypertrophy) 06/02/2017  . Mitral regurgitation 05/22/2017  .  Wheezing 04/05/2017  . Jo-1 antibody positive 01/29/2017  . Primary osteoarthritis of both feet 01/29/2017  . Primary osteoarthritis of both hands 01/29/2017  . Other fatigue 01/29/2017  . Trapezoid ligament sprain 01/17/2017  . Left shoulder tendonitis  01/17/2017  . Vitamin D deficiency 04/26/2016  . High risk medication use 03/26/2016  . Frozen shoulder, right 03/26/2016  . Morbid obesity ([place]) 03/26/2016  . Cough 07/18/2015  . Neck mass 08/27/2013  . MVP (mitral valve prolapse)   . Erythema nodosum   . Throat clearing 05/07/2012  . INCONTINENCE, URGE 05/26/2010  . Migraine headache 02/15/2010  . Fibromyalgia 06/20/2009  . Polymyositis ([place]) 02/11/2008  . BRONCHIECTASIS 02/10/2008    Past Medical History:  Diagnosis Date  . Abnormal liver function test   . Bronchiectasis       . Fibromyalgia   . Heart murmur   . Menorrhagia   . MIGRAINE HEADACHE   . MVP (mitral valve prolapse)   . Polymyositis ([place])    Dr [name]; chronic MTX  . Pulmonary fibrosis ([place])   . Rheumatoid arthritis ([place])     Family History  Problem Relation Age of Onset  . Diabetes Mother   . Fibromyalgia Mother   . Ulcers Mother   . Heart failure Mother   . Multiple myeloma Father   . Lupus Sister   . Other Sister        abdominal adhesions resulting in bowel obstruction  . Migraines Sister   . Heart disease Brother        bypass surgery  . Rheum arthritis Sister   . Multiple myeloma Sister   . Hypertension Sister   . Heart attack Sister   . Diabetes Sister   . Hypertension Sister   . Rheum arthritis Sister   . Diabetes Sister   . Diabetes Brother   . Headache Other        siblings with headaches but not diagnosed as migraines   Past Surgical History:  Procedure Laterality Date  . ablation uterine  2010  . CARDIAC CATHETERIZATION  2002   normal  . DILATION AND CURETTAGE OF UTERUS  08-31-08   Dr [name]  . IR ANGIO INTRA EXTRACRAN SEL COM CAROTID INNOMINATE BILAT MOD SED  09/18/2018  . IR ANGIO VERTEBRAL SEL VERTEBRAL BILAT MOD SED  09/18/2018  . IR US GUIDE VASC ACCESS RIGHT  09/18/2018   Social History   Social History Narrative   Exercise: trying to walk - limited by fatigue   Lives at home with husband and daughter    Right handed   Caffeine: occasional coke 1-2x per month   Immunization History  Administered Date(s) Administered  . Influenza Split 04/10/2011, 06/18/2012, 05/06/2013  . Influenza Whole 05/19/2008, 05/15/2009, 06/01/2010  . Influenza,inj,Quad PF,6+ Mos 04/20/2014, 05/26/2015, 05/31/2016, 04/05/2017, 06/16/2018  . Pneumococcal Conjugate-13 01/29/2017  . Pneumococcal Polysaccharide-23 08/07/2007, 06/16/2018  . Td 07/12/2009     Objective: Vital Signs: BP (!) 139/92 (BP Location: Left Arm, Patient Position: Sitting, Cuff Size: Normal)   Pulse 78   Resp 16   Ht 5' 8"  (1.727 m)   Wt 282 lb (127.9 kg)   BMI 42.88 kg/m    Physical Exam Vitals signs and nursing note reviewed.  Constitutional:      Appearance: She is well-developed.  HENT:     Head: Normocephalic and atraumatic.  Eyes:     Conjunctiva/sclera: Conjunctivae normal.  Neck:     Musculoskeletal: Normal range of motion.  Cardiovascular:  Rate and Rhythm: Normal rate and regular rhythm.     Heart sounds: Normal heart sounds.  Pulmonary:     Effort: Pulmonary effort is normal.     Breath sounds: Normal breath sounds.     Comments: Bibasilar crackles Abdominal:     General: Bowel sounds are normal.     Palpations: Abdomen is soft.  Lymphadenopathy:     Cervical: No cervical adenopathy.  Skin:    General: Skin is warm and dry.     Capillary Refill: Capillary refill takes less than 2 seconds.  Neurological:     Mental Status: She is alert and oriented to person, place, and time.  Psychiatric:        Behavior: Behavior normal.      Musculoskeletal Exam: C-spine, thoracic spine, and lumbar spine good ROM.  No midline spinal tenderness.  No SI joint tenderness.  Shoulder joints, elbow joints, wrist joints MCPs, PIPs, and DIPs good ROM with no synovitis.  Complete fist formation bilaterally.  Hip joints, knee joints, ankle joints, MTPs, PIPs, and DIPs good ROM with no synovitis.  No warmth or effusion of knee  joints.  No tenderness or swelling of ankle joints.  No tenderness over trochanteric bursa bilaterally.    CDAI Exam: CDAI Score: 1.2  Patient Global Assessment: 6 (mm); Provider Global Assessment: 6 (mm) Swollen: 0 ; Tender: 0  Joint Exam   Not documented   There is currently no information documented on the homunculus. Go to the Rheumatology activity and complete the homunculus joint exam.  Investigation: No additional findings.  Imaging: Ct Angio Head W Or Wo Contrast  Result Date: 09/05/2018 CLINICAL DATA:  53 y/o F; right carotid stenosis. Headaches and lightheadedness. EXAM: CT ANGIOGRAPHY HEAD AND NECK TECHNIQUE: Multidetector CT imaging of the head and neck was performed using the standard protocol during bolus administration of intravenous contrast. Multiplanar CT image reconstructions and MIPs were obtained to evaluate the vascular anatomy. Carotid stenosis measurements (when applicable) are obtained utilizing NASCET criteria, using the distal internal carotid diameter as the denominator. CONTRAST:  151m ISOVUE-370 IOPAMIDOL (ISOVUE-370) INJECTION 76% COMPARISON:  08/20/2018 MRI and MRA of the head. FINDINGS: CT HEAD FINDINGS Brain: No evidence of acute infarction, hemorrhage, hydrocephalus, extra-axial collection or mass lesion/mass effect. Vascular: As below. Skull: Normal. Negative for fracture or focal lesion. Sinuses: Imaged portions are clear. Orbits: No acute finding. Review of the MIP images confirms the above findings CTA NECK FINDINGS Aortic arch: Standard branching. Imaged portion shows no evidence of aneurysm or dissection. No significant stenosis of the major arch vessel origins. Right carotid system: No evidence of dissection, stenosis (50% or greater) or occlusion. Non stenotic beaded irregularity of mid and upper internal carotid artery (series 12, image 21). Left carotid system: No evidence of dissection, stenosis (50% or greater) or occlusion. Non stenotic beaded  irregularity of mid and upper internal carotid artery (series 12, image 22). Vertebral arteries: Codominant. No evidence of dissection, stenosis (50% or greater) or occlusion. Skeleton: Negative. Other neck: Negative. Upper chest: Mild peripheral fibrosis of the upper lobes. Review of the MIP images confirms the above findings CTA HEAD FINDINGS Anterior circulation: No significant stenosis, proximal occlusion, aneurysm, or vascular malformation. Posterior circulation: No significant stenosis, proximal occlusion, aneurysm, or vascular malformation. Venous sinuses: As permitted by contrast timing, patent. Anatomic variants: Anterior communicating artery and right posterior communicating arteries are patent. No left posterior communicating artery identified, likely hypoplastic or absent. Delayed phase: No abnormal intracranial enhancement. Review of the  MIP images confirms the above findings IMPRESSION: 1. Patent carotid and vertebral arteries. No dissection, aneurysm, or hemodynamically significant stenosis utilizing NASCET criteria. 2. Patent anterior and posterior intracranial circulation. No large vessel occlusion, aneurysm, or significant stenosis. 3. Non stenotic beaded irregularity of mid and upper cervical internal carotid arteries compatible with fibromuscular dysplasia. 4. Negative noncontrast CT of the head. No abnormal enhancement after contrast administration. Electronically Signed   By: Kristine Garbe M.D.   On: 09/05/2018 03:15   Ct Angio Neck W Or Wo Contrast  Result Date: 09/05/2018 CLINICAL DATA:  53 y/o F; right carotid stenosis. Headaches and lightheadedness. EXAM: CT ANGIOGRAPHY HEAD AND NECK TECHNIQUE: Multidetector CT imaging of the head and neck was performed using the standard protocol during bolus administration of intravenous contrast. Multiplanar CT image reconstructions and MIPs were obtained to evaluate the vascular anatomy. Carotid stenosis measurements (when applicable) are  obtained utilizing NASCET criteria, using the distal internal carotid diameter as the denominator. CONTRAST:  121m ISOVUE-370 IOPAMIDOL (ISOVUE-370) INJECTION 76% COMPARISON:  08/20/2018 MRI and MRA of the head. FINDINGS: CT HEAD FINDINGS Brain: No evidence of acute infarction, hemorrhage, hydrocephalus, extra-axial collection or mass lesion/mass effect. Vascular: As below. Skull: Normal. Negative for fracture or focal lesion. Sinuses: Imaged portions are clear. Orbits: No acute finding. Review of the MIP images confirms the above findings CTA NECK FINDINGS Aortic arch: Standard branching. Imaged portion shows no evidence of aneurysm or dissection. No significant stenosis of the major arch vessel origins. Right carotid system: No evidence of dissection, stenosis (50% or greater) or occlusion. Non stenotic beaded irregularity of mid and upper internal carotid artery (series 12, image 21). Left carotid system: No evidence of dissection, stenosis (50% or greater) or occlusion. Non stenotic beaded irregularity of mid and upper internal carotid artery (series 12, image 22). Vertebral arteries: Codominant. No evidence of dissection, stenosis (50% or greater) or occlusion. Skeleton: Negative. Other neck: Negative. Upper chest: Mild peripheral fibrosis of the upper lobes. Review of the MIP images confirms the above findings CTA HEAD FINDINGS Anterior circulation: No significant stenosis, proximal occlusion, aneurysm, or vascular malformation. Posterior circulation: No significant stenosis, proximal occlusion, aneurysm, or vascular malformation. Venous sinuses: As permitted by contrast timing, patent. Anatomic variants: Anterior communicating artery and right posterior communicating arteries are patent. No left posterior communicating artery identified, likely hypoplastic or absent. Delayed phase: No abnormal intracranial enhancement. Review of the MIP images confirms the above findings IMPRESSION: 1. Patent carotid and  vertebral arteries. No dissection, aneurysm, or hemodynamically significant stenosis utilizing NASCET criteria. 2. Patent anterior and posterior intracranial circulation. No large vessel occlusion, aneurysm, or significant stenosis. 3. Non stenotic beaded irregularity of mid and upper cervical internal carotid arteries compatible with fibromuscular dysplasia. 4. Negative noncontrast CT of the head. No abnormal enhancement after contrast administration. Electronically Signed   By: LKristine GarbeM.D.   On: 09/05/2018 03:15   Ir [place]Guide Vasc Access Right  Result Date: 09/19/2018 CLINICAL DATA:  History of headaches and neck pain. Abnormal CT angiogram of the head and neck. Right carotid stenosis. EXAM: IR ANGIO VERTEBRAL SEL VERTEBRAL BILAT MOD SED; BILATERAL COMMON CAROTID AND INNOMINATE ANGIOGRAPHY; IR ULTRASOUND GUIDANCE VASC ACCESS RIGHT COMPARISON:  CT angiogram of the head and neck of 09/04/2018. MEDICATIONS: Heparin 2000 units IA. No antibiotic was administered within 1 hour of the procedure. ANESTHESIA/SEDATION: Versed 1 mg IV; Fentanyl 25 mcg IV Moderate Sedation Time:  48 minutes The patient was continuously monitored during the procedure by the interventional  radiology nurse under my direct supervision. CONTRAST:  Isovue 300 approximately 60 mL. FLUOROSCOPY TIME:  Fluoroscopy Time: 23 minutes 18 seconds (1267 mGy). COMPLICATIONS: None immediate. TECHNIQUE: Informed written consent was obtained from the patient after a thorough discussion of the procedural risks, benefits and alternatives. All questions were addressed. Maximal Sterile Barrier Technique was utilized including caps, mask, sterile gowns, sterile gloves, sterile drape, hand hygiene and skin antiseptic. A timeout was performed prior to the initiation of the procedure. The right forearm to the wrist was prepped and draped in the usual sterile fashion. The right radial artery was then identified. A dorsal palmar anastomosis was then  verified. Thereafter using ultrasound guidance, access into the right radial artery was obtained without difficulty. Over a 0.018 inch guidewire, a 4 French radial sheath was then inserted without difficulty. Good aspiration was obtained from the side port of the sheath. A cocktail of 2.5 mg of verapamil, 2000 units of heparin, and 200 mcg of nitroglycerin were then slowly injected through the sheath in a diluted form without any difficulty. After obtaining a roadmap via the right radial sheath, over a 0.035 inch Roadrunner guidewire, a 5 [place] Simmons 2 diagnostic catheter was then advanced to the aortic arch. The diagnostic Simmons catheter was then formed without difficulty. Selective cannulations were then performed of the right vertebral artery, the right common carotid artery, the left common carotid artery and the left vertebral artery. There were no complications. The patient tolerated the procedure well. The diagnostic catheter was then removed. The radial sheath was removed with successful application of a wrist band. Hemostasis was achieved. Distal radial pulse was palpable distal to the wrist band. FINDINGS: The right vertebral artery origin is widely patent. The vessel ascends normally to the cranial skull base. Wide patency is seen of the right vertebrobasilar junction and the right posterior-inferior cerebellar artery. Minimal fusiform dilatation of the second vertical segment of the right vertebral artery is noted. The opacified portions of the basilar artery, the posterior cerebral arteries, the superior cerebellar arteries and the anterior-inferior cerebellar arteries is noted into the capillary and venous phases. Transient retrograde opacification of the right posterior communicating artery is noted. Unopacified blood is seen in the basilar artery from the contralateral vertebral artery. The right common carotid arteriogram demonstrates the right external carotid artery and its major branches to  be widely patent. The right internal carotid artery at the bulb and just distally is widely patent. Distal to this there is a segmental area of mild focal areas of dilatation associated with mild narrowing most compatible with moderate fibromuscular dysplasia. However, on the lateral projection, there is suggestion of a high-grade stenosis at the proximal aspect of the FMD-like changes. More distally the distal cervical right ICA is widely patent. The petrous, cavernous and supraclinoid segments are widely patent. The right middle cerebral artery and the right anterior cerebral artery opacify into the capillary and venous phases. Transient cross-filling via the anterior communicating artery of the left anterior cerebral artery is noted. The venous phase demonstrates mild prominence of the jugular bulb. The left common carotid arteriogram demonstrates the left external carotid artery and its major branches to be widely patent. The left internal carotid artery at the bulb and just distally is widely patent. Again noted is a focal segmental area of smooth irregularity with outpouching associated with mild narrowing again most compatible with moderate FMD-like changes. Distal to this the vessel appears normal in caliber. The petrous, cavernous and supraclinoid segments are widely patent.  The left middle cerebral artery and the left anterior cerebral artery opacify into the capillary and venous phases. The left vertebral artery origin is widely patent. The vessel is seen to opacify to the cranial skull base. Wide patency is seen of the left vertebrobasilar junction and the left posterior inferior cerebellar artery. The opacified portion of the basilar artery, the posterior cerebral arteries, the superior cerebellar arteries and the anterior-inferior cerebellar arteries demonstrates wide patency. Again noted is retrograde opacification of the right posterior communicating artery and transiently the supraclinoid right ICA.  IMPRESSION: Moderate FMD changes involving the mid cervical segment of the left internal carotid artery with no significant stenosis. Moderate to severe fibromuscular dysplastic change involving the right internal carotid artery distal to the bulb associated with suggestion of a high-grade stenosis on the AP projection at the proximal aspect of the FMD-like changes. Transient retrograde opacification of the right internal carotid artery supraclinoid segment via the right posterior communicating artery. PLAN: As per referring MD. Electronically Signed   By: Luanne Bras M.D.   On: 09/18/2018 11:00   Ir Angio Intra Extracran Sel Com Carotid Innominate Bilat Mod Sed  Result Date: 09/19/2018 CLINICAL DATA:  History of headaches and neck pain. Abnormal CT angiogram of the head and neck. Right carotid stenosis. EXAM: IR ANGIO VERTEBRAL SEL VERTEBRAL BILAT MOD SED; BILATERAL COMMON CAROTID AND INNOMINATE ANGIOGRAPHY; IR ULTRASOUND GUIDANCE VASC ACCESS RIGHT COMPARISON:  CT angiogram of the head and neck of 09/04/2018. MEDICATIONS: Heparin 2000 units IA. No antibiotic was administered within 1 hour of the procedure. ANESTHESIA/SEDATION: Versed 1 mg IV; Fentanyl 25 mcg IV Moderate Sedation Time:  48 minutes The patient was continuously monitored during the procedure by the interventional radiology nurse under my direct supervision. CONTRAST:  Isovue 300 approximately 60 mL. FLUOROSCOPY TIME:  Fluoroscopy Time: 23 minutes 18 seconds (1267 mGy). COMPLICATIONS: None immediate. TECHNIQUE: Informed written consent was obtained from the patient after a thorough discussion of the procedural risks, benefits and alternatives. All questions were addressed. Maximal Sterile Barrier Technique was utilized including caps, mask, sterile gowns, sterile gloves, sterile drape, hand hygiene and skin antiseptic. A timeout was performed prior to the initiation of the procedure. The right forearm to the wrist was prepped and draped in  the usual sterile fashion. The right radial artery was then identified. A dorsal palmar anastomosis was then verified. Thereafter using ultrasound guidance, access into the right radial artery was obtained without difficulty. Over a 0.018 inch guidewire, a 4 French radial sheath was then inserted without difficulty. Good aspiration was obtained from the side port of the sheath. A cocktail of 2.5 mg of verapamil, 2000 units of heparin, and 200 mcg of nitroglycerin were then slowly injected through the sheath in a diluted form without any difficulty. After obtaining a roadmap via the right radial sheath, over a 0.035 inch Roadrunner guidewire, a 5 [place] Simmons 2 diagnostic catheter was then advanced to the aortic arch. The diagnostic Simmons catheter was then formed without difficulty. Selective cannulations were then performed of the right vertebral artery, the right common carotid artery, the left common carotid artery and the left vertebral artery. There were no complications. The patient tolerated the procedure well. The diagnostic catheter was then removed. The radial sheath was removed with successful application of a wrist band. Hemostasis was achieved. Distal radial pulse was palpable distal to the wrist band. FINDINGS: The right vertebral artery origin is widely patent. The vessel ascends normally to the cranial skull base. Wide patency  is seen of the right vertebrobasilar junction and the right posterior-inferior cerebellar artery. Minimal fusiform dilatation of the second vertical segment of the right vertebral artery is noted. The opacified portions of the basilar artery, the posterior cerebral arteries, the superior cerebellar arteries and the anterior-inferior cerebellar arteries is noted into the capillary and venous phases. Transient retrograde opacification of the right posterior communicating artery is noted. Unopacified blood is seen in the basilar artery from the contralateral vertebral artery.  The right common carotid arteriogram demonstrates the right external carotid artery and its major branches to be widely patent. The right internal carotid artery at the bulb and just distally is widely patent. Distal to this there is a segmental area of mild focal areas of dilatation associated with mild narrowing most compatible with moderate fibromuscular dysplasia. However, on the lateral projection, there is suggestion of a high-grade stenosis at the proximal aspect of the FMD-like changes. More distally the distal cervical right ICA is widely patent. The petrous, cavernous and supraclinoid segments are widely patent. The right middle cerebral artery and the right anterior cerebral artery opacify into the capillary and venous phases. Transient cross-filling via the anterior communicating artery of the left anterior cerebral artery is noted. The venous phase demonstrates mild prominence of the jugular bulb. The left common carotid arteriogram demonstrates the left external carotid artery and its major branches to be widely patent. The left internal carotid artery at the bulb and just distally is widely patent. Again noted is a focal segmental area of smooth irregularity with outpouching associated with mild narrowing again most compatible with moderate FMD-like changes. Distal to this the vessel appears normal in caliber. The petrous, cavernous and supraclinoid segments are widely patent. The left middle cerebral artery and the left anterior cerebral artery opacify into the capillary and venous phases. The left vertebral artery origin is widely patent. The vessel is seen to opacify to the cranial skull base. Wide patency is seen of the left vertebrobasilar junction and the left posterior inferior cerebellar artery. The opacified portion of the basilar artery, the posterior cerebral arteries, the superior cerebellar arteries and the anterior-inferior cerebellar arteries demonstrates wide patency. Again noted is  retrograde opacification of the right posterior communicating artery and transiently the supraclinoid right ICA. IMPRESSION: Moderate FMD changes involving the mid cervical segment of the left internal carotid artery with no significant stenosis. Moderate to severe fibromuscular dysplastic change involving the right internal carotid artery distal to the bulb associated with suggestion of a high-grade stenosis on the AP projection at the proximal aspect of the FMD-like changes. Transient retrograde opacification of the right internal carotid artery supraclinoid segment via the right posterior communicating artery. PLAN: As per referring MD. Electronically Signed   By: Luanne Bras M.D.   On: 09/18/2018 11:00   Ir Angio Vertebral Sel Vertebral Bilat Mod Sed  Result Date: 09/19/2018 CLINICAL DATA:  History of headaches and neck pain. Abnormal CT angiogram of the head and neck. Right carotid stenosis. EXAM: IR ANGIO VERTEBRAL SEL VERTEBRAL BILAT MOD SED; BILATERAL COMMON CAROTID AND INNOMINATE ANGIOGRAPHY; IR ULTRASOUND GUIDANCE VASC ACCESS RIGHT COMPARISON:  CT angiogram of the head and neck of 09/04/2018. MEDICATIONS: Heparin 2000 units IA. No antibiotic was administered within 1 hour of the procedure. ANESTHESIA/SEDATION: Versed 1 mg IV; Fentanyl 25 mcg IV Moderate Sedation Time:  48 minutes The patient was continuously monitored during the procedure by the interventional radiology nurse under my direct supervision. CONTRAST:  Isovue 300 approximately 60 mL. FLUOROSCOPY TIME:  Fluoroscopy Time: 23 minutes 18 seconds (1267 mGy). COMPLICATIONS: None immediate. TECHNIQUE: Informed written consent was obtained from the patient after a thorough discussion of the procedural risks, benefits and alternatives. All questions were addressed. Maximal Sterile Barrier Technique was utilized including caps, mask, sterile gowns, sterile gloves, sterile drape, hand hygiene and skin antiseptic. A timeout was performed prior  to the initiation of the procedure. The right forearm to the wrist was prepped and draped in the usual sterile fashion. The right radial artery was then identified. A dorsal palmar anastomosis was then verified. Thereafter using ultrasound guidance, access into the right radial artery was obtained without difficulty. Over a 0.018 inch guidewire, a 4 French radial sheath was then inserted without difficulty. Good aspiration was obtained from the side port of the sheath. A cocktail of 2.5 mg of verapamil, 2000 units of heparin, and 200 mcg of nitroglycerin were then slowly injected through the sheath in a diluted form without any difficulty. After obtaining a roadmap via the right radial sheath, over a 0.035 inch Roadrunner guidewire, a 5 [place] Simmons 2 diagnostic catheter was then advanced to the aortic arch. The diagnostic Simmons catheter was then formed without difficulty. Selective cannulations were then performed of the right vertebral artery, the right common carotid artery, the left common carotid artery and the left vertebral artery. There were no complications. The patient tolerated the procedure well. The diagnostic catheter was then removed. The radial sheath was removed with successful application of a wrist band. Hemostasis was achieved. Distal radial pulse was palpable distal to the wrist band. FINDINGS: The right vertebral artery origin is widely patent. The vessel ascends normally to the cranial skull base. Wide patency is seen of the right vertebrobasilar junction and the right posterior-inferior cerebellar artery. Minimal fusiform dilatation of the second vertical segment of the right vertebral artery is noted. The opacified portions of the basilar artery, the posterior cerebral arteries, the superior cerebellar arteries and the anterior-inferior cerebellar arteries is noted into the capillary and venous phases. Transient retrograde opacification of the right posterior communicating artery is  noted. Unopacified blood is seen in the basilar artery from the contralateral vertebral artery. The right common carotid arteriogram demonstrates the right external carotid artery and its major branches to be widely patent. The right internal carotid artery at the bulb and just distally is widely patent. Distal to this there is a segmental area of mild focal areas of dilatation associated with mild narrowing most compatible with moderate fibromuscular dysplasia. However, on the lateral projection, there is suggestion of a high-grade stenosis at the proximal aspect of the FMD-like changes. More distally the distal cervical right ICA is widely patent. The petrous, cavernous and supraclinoid segments are widely patent. The right middle cerebral artery and the right anterior cerebral artery opacify into the capillary and venous phases. Transient cross-filling via the anterior communicating artery of the left anterior cerebral artery is noted. The venous phase demonstrates mild prominence of the jugular bulb. The left common carotid arteriogram demonstrates the left external carotid artery and its major branches to be widely patent. The left internal carotid artery at the bulb and just distally is widely patent. Again noted is a focal segmental area of smooth irregularity with outpouching associated with mild narrowing again most compatible with moderate FMD-like changes. Distal to this the vessel appears normal in caliber. The petrous, cavernous and supraclinoid segments are widely patent. The left middle cerebral artery and the left anterior cerebral artery opacify into the capillary and  venous phases. The left vertebral artery origin is widely patent. The vessel is seen to opacify to the cranial skull base. Wide patency is seen of the left vertebrobasilar junction and the left posterior inferior cerebellar artery. The opacified portion of the basilar artery, the posterior cerebral arteries, the superior cerebellar  arteries and the anterior-inferior cerebellar arteries demonstrates wide patency. Again noted is retrograde opacification of the right posterior communicating artery and transiently the supraclinoid right ICA. IMPRESSION: Moderate FMD changes involving the mid cervical segment of the left internal carotid artery with no significant stenosis. Moderate to severe fibromuscular dysplastic change involving the right internal carotid artery distal to the bulb associated with suggestion of a high-grade stenosis on the AP projection at the proximal aspect of the FMD-like changes. Transient retrograde opacification of the right internal carotid artery supraclinoid segment via the right posterior communicating artery. PLAN: As per referring MD. Electronically Signed   By: Luanne Bras M.D.   On: 09/18/2018 11:00    Recent Labs: Lab Results  Component Value Date   WBC 7.9 09/22/2018   HGB 14.5 09/22/2018   PLT 313 09/22/2018   NA 138 09/22/2018   K 4.3 09/22/2018   CL 105 09/22/2018   CO2 25 09/22/2018   GLUCOSE 84 09/22/2018   BUN 9 09/22/2018   CREATININE 0.74 09/22/2018   BILITOT 0.7 09/22/2018   ALKPHOS 141 (H) 01/31/2017   AST 21 09/22/2018   ALT 15 09/22/2018   PROT 7.8 09/22/2018   ALBUMIN 3.6 01/31/2017   CALCIUM 10.0 09/22/2018   GFRAA 108 09/22/2018    Speciality Comments: No specialty comments available.  Procedures:  No procedures performed Allergies: Sulfonamide derivatives   Assessment / Plan:     Visit Diagnoses: Rheumatoid arthritis involving multiple sites with positive rheumatoid factor (HCC) - +RF, +CCP: She has no synovitis or tenderness on exam.  She has not had any recent rheumatoid arthritis flares.  She has intermittent discomfort in bilateral wrist joints and bilateral shoulder joints.  She is on Imuran 100 mg by mouth daily.  She has not noticed any difference when switching from methotrexate to Imuran.  She has been tolerating Imuran well.  She will continue on  Imuran 100 mg by mouth daily.  She was advised to notify us if she develops increased joint pain or joint swelling.  She will follow-up in the office in 5 months.  High risk medication use  - Imuran.  Discontinue methotrexate due to further progression of ILD.  CBC and CMP were drawn on 07/23/2019.  She will continue to have lab work every 3 months to monitor for drug toxicity.  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  She has full strength of upper and lower extremities on exam.  Full strength of neck flexors.  She has no difficulty getting up from a chair or raising her arms above her head.  We will check CK and sed rate today. Plan: CK, Sedimentation rate  Jo-1 antibody positive  Interstitial lung disease: Bibasilar crackles were auscultated on exam.  She is followed by [name].  She was switched from Breo 100 to Symbicort 80 at her last visit on 07/29/19.  Pulmonology was in agreement to continue on Imuran 100 mg by mouth daily.  She continues have shortness of breath with exertion.  She will be following up with [name] on 10/27/18.  We would like  to know Dr. Juanetta Gosling input if patient needs to be on increased dose of Imuran.  She will be starting pulmonary rehab on 10/13/2018.  Primary osteoarthritis of both hands: She has PIP and DIP synovial thickening.  She has complete fist formation bilaterally.  Joint protection and muscle strengthening were discussed.   Primary osteoarthritis of both feet: She has no discomfort in her feet at this time.  She was proper fitting shoes.  Fibromyalgia: She continues have generalized muscle aches muscle tenderness due to fibromyalgia.  She has chronic fatigue.  Her fatigue has been worsening over the past 1 week.  Erythema nodosum: No lesions at this time.    Fibromuscular dysplasia ([place]): She was evaluated by Dr. Jaynee Eagles on 08/12/2018 for exertional headaches.  She had a  home sleep study that revealed mild sleep apnea and was started on CPAP.  She also had an extensive work-up including an MRI/MRA followed by CT angiogram of the head on 09/04/2018 that revealed findings consistent with possible fibromuscular dysplasia.  Cerebral arteriogram was performed on 09/18/18 that confirmed the diagnosis.  She was started on aspirin 81 mg by mouth daily.  We discussed the importance of blood pressure control.  She will have repeat imaging in 6 months.  Other fatigue - She has been experiencing increased fatigue for the past 1 week.  We will recheck TSH and vitamin D today. Plan: VITAMIN D 25 Hydroxy (Vit-D Deficiency, Fractures), TSH  History of mitral valve prolapse: No murmur auscultated. She has not had any palpitations recently. She had echocardiogram performed on 05/23/2018 that revealed mild mitral regurgitation.  EF was 60 to 65%.  History of migraine: She is followed by Dr. Jaynee Eagles.   Bronchiectasis without complication [facility]): She is followed by [name].  She continues to have a chronic productive cough.  She was evaluated on 07/28/2018.  She performed a 6-minute walk test at that visit. Most recent PFT was on 04/17/18 DLCO-DLCO 76%.  She was encouraged to continue on Mucinex and was switched from [facility] to Symbicort.  She will be following up with pulmonology on 10/27/2018.  She will be starting pulmonary rehab on 10/13/2018.  She follows up with [name] on 10/27/2018.   Elevated blood pressure reading I discussed the significance of keeping blood pressure under control with the history of FMD.  I have advised her to monitor her blood pressure closely over the next 10 days and schedule an appointment with her PCP for blood pressure stays elevated.-  History of vitamin D deficiency -She has been taking vitamin D 50,000 units by mouth once monthly.  We will check vitamin D level today plan: VITAMIN D 25 Hydroxy (Vit-D Deficiency, Fractures)   Orders: Orders Placed This Encounter    Procedures  . CK  . Sedimentation rate  . VITAMIN D 25 Hydroxy (Vit-D Deficiency, Fractures)  . TSH   No orders of the defined types were placed in this encounter.   Face-to-face time spent with patient was 30 minutes. Greater than 50% of time was spent in counseling and coordination of care.  Follow-Up Instructions: Return in about 3 months (around 12/30/2018) for Rheumatoid arthritis, polymyositis.   Ofilia Neas, PA-C   I examined and evaluated the patient with Hazel Sams PA.  Patient's rheumatoid arthritis is very well controlled.  She had no synovitis on examination.  She had no muscular weakness although she has been experiencing increased fatigue.  We will obtain some additional labs today.  I would also like  to have Dr. Juanetta Gosling input regarding dosing of Imuran as regards to her interstitial lung disease.  She has recent diagnosis of FMD.  She has been taking aspirin.  We have advised her to monitor her blood pressure closely.  The plan of care was discussed as noted above.  Bo Merino, MD  Note - This record has been created using Editor, commissioning.  Chart creation errors have been sought, but may not always  have been located. Such creation errors do not reflect on  the standard of medical care.

## 2018-09-28 ENCOUNTER — Other Ambulatory Visit: Payer: Self-pay | Admitting: Internal Medicine

## 2018-09-28 DIAGNOSIS — G4733 Obstructive sleep apnea (adult) (pediatric): Secondary | ICD-10-CM | POA: Diagnosis not present

## 2018-09-29 ENCOUNTER — Encounter (INDEPENDENT_AMBULATORY_CARE_PROVIDER_SITE_OTHER): Payer: Federal, State, Local not specified - PPO

## 2018-10-01 ENCOUNTER — Encounter: Payer: Self-pay | Admitting: Physician Assistant

## 2018-10-01 ENCOUNTER — Ambulatory Visit: Payer: Federal, State, Local not specified - PPO | Admitting: Physician Assistant

## 2018-10-01 VITALS — BP 139/92 | HR 78 | Resp 16 | Ht 68.0 in | Wt 282.0 lb

## 2018-10-01 DIAGNOSIS — M0579 Rheumatoid arthritis with rheumatoid factor of multiple sites without organ or systems involvement: Secondary | ICD-10-CM | POA: Diagnosis not present

## 2018-10-01 DIAGNOSIS — R030 Elevated blood-pressure reading, without diagnosis of hypertension: Secondary | ICD-10-CM

## 2018-10-01 DIAGNOSIS — Z8639 Personal history of other endocrine, nutritional and metabolic disease: Secondary | ICD-10-CM | POA: Diagnosis not present

## 2018-10-01 DIAGNOSIS — M19042 Primary osteoarthritis, left hand: Secondary | ICD-10-CM

## 2018-10-01 DIAGNOSIS — R5383 Other fatigue: Secondary | ICD-10-CM

## 2018-10-01 DIAGNOSIS — M19071 Primary osteoarthritis, right ankle and foot: Secondary | ICD-10-CM

## 2018-10-01 DIAGNOSIS — M19041 Primary osteoarthritis, right hand: Secondary | ICD-10-CM

## 2018-10-01 DIAGNOSIS — M797 Fibromyalgia: Secondary | ICD-10-CM

## 2018-10-01 DIAGNOSIS — Z8679 Personal history of other diseases of the circulatory system: Secondary | ICD-10-CM

## 2018-10-01 DIAGNOSIS — L52 Erythema nodosum: Secondary | ICD-10-CM

## 2018-10-01 DIAGNOSIS — M19072 Primary osteoarthritis, left ankle and foot: Secondary | ICD-10-CM

## 2018-10-01 DIAGNOSIS — I773 Arterial fibromuscular dysplasia: Secondary | ICD-10-CM

## 2018-10-01 DIAGNOSIS — Z79899 Other long term (current) drug therapy: Secondary | ICD-10-CM

## 2018-10-01 DIAGNOSIS — M3320 Polymyositis, organ involvement unspecified: Secondary | ICD-10-CM | POA: Diagnosis not present

## 2018-10-01 DIAGNOSIS — Z8669 Personal history of other diseases of the nervous system and sense organs: Secondary | ICD-10-CM

## 2018-10-01 DIAGNOSIS — R768 Other specified abnormal immunological findings in serum: Secondary | ICD-10-CM

## 2018-10-01 DIAGNOSIS — J479 Bronchiectasis, uncomplicated: Secondary | ICD-10-CM

## 2018-10-01 NOTE — Patient Instructions (Signed)
Standing Labs We placed an order today for your standing lab work.    Please come back and get your standing labs in May and every 3 months  We have open lab Monday through Friday from 8:30-11:30 AM and 1:30-4:00 PM  at the office of Dr. Cameron Katayama.   You may experience shorter wait times on Monday and Friday afternoons. The office is located at [address], [address], [place], [place] [address] No appointment is necessary.   Labs are drawn by [facility].  You may receive a bill from [facility] for your lab work.  If you wish to have your labs drawn at another location, please call the office 24 hours in advance to send orders.  If you have any questions regarding directions or hours of operation,  please call [phone].   Just as a reminder please drink plenty of water prior to coming for your lab work. Thanks!  

## 2018-10-02 ENCOUNTER — Telehealth: Payer: Self-pay | Admitting: *Deleted

## 2018-10-02 DIAGNOSIS — E559 Vitamin D deficiency, unspecified: Secondary | ICD-10-CM

## 2018-10-02 LAB — CK: Total CK: 52 U/L (ref 29–143)

## 2018-10-02 LAB — TSH: TSH: 3.24 m[IU]/L

## 2018-10-02 LAB — SEDIMENTATION RATE: Sed Rate: 39 mm/h — ABNORMAL HIGH (ref 0–30)

## 2018-10-02 LAB — VITAMIN D 25 HYDROXY (VIT D DEFICIENCY, FRACTURES): VIT D 25 HYDROXY: 25 ng/mL — AB (ref 30–100)

## 2018-10-02 MED ORDER — VITAMIN D (ERGOCALCIFEROL) 1.25 MG (50000 UNIT) PO CAPS: 50000.0000 [IU] | ORAL_CAPSULE | ORAL | 0 refills | Status: DC

## 2018-10-02 NOTE — Progress Notes (Signed)
CK WNL. Sed rate mildly elevated-39. Vitamin D is low-25.  Please notify patient and send in vitamin D 50,000 units by mouth once weekly.  We will recheck in 3 months. TSH WNL.

## 2018-10-02 NOTE — Telephone Encounter (Signed)
-----   Message from Ofilia Neas, PA-C sent at 10/02/2018 12:32 PM EST ----- CK WNL. Sed rate mildly elevated-39. Vitamin D is low-25.  Please notify patient and send in vitamin D 50,000 units by mouth once weekly.  We will recheck in 3 months. TSH WNL.

## 2018-10-03 ENCOUNTER — Other Ambulatory Visit: Payer: Self-pay | Admitting: Rheumatology

## 2018-10-03 NOTE — Telephone Encounter (Signed)
Last Visit: 10/01/18 Next Visit: 01/01/19 Labs: 09/22/18 CMP WNL. RBC count is borderline elevated. Rest of CBC WNL.   Okay to refill per Dr. Estanislado Pandy

## 2018-10-08 ENCOUNTER — Ambulatory Visit (INDEPENDENT_AMBULATORY_CARE_PROVIDER_SITE_OTHER)
Admission: RE | Admit: 2018-10-08 | Discharge: 2018-10-08 | Disposition: A | Discharge status: Home / Self Care | Payer: Federal, State, Local not specified - PPO | Source: Ambulatory Visit | Attending: Nurse Practitioner | Admitting: Nurse Practitioner

## 2018-10-08 ENCOUNTER — Encounter: Payer: Self-pay | Admitting: Nurse Practitioner

## 2018-10-08 ENCOUNTER — Ambulatory Visit: Payer: Federal, State, Local not specified - PPO | Admitting: Nurse Practitioner

## 2018-10-08 VITALS — BP 126/80 | HR 81 | Temp 98.0°F | Ht 68.0 in | Wt 279.8 lb

## 2018-10-08 DIAGNOSIS — R059 Cough, unspecified: Secondary | ICD-10-CM

## 2018-10-08 DIAGNOSIS — R05 Cough: Secondary | ICD-10-CM

## 2018-10-08 DIAGNOSIS — J471 Bronchiectasis with (acute) exacerbation: Secondary | ICD-10-CM | POA: Insufficient documentation

## 2018-10-08 DIAGNOSIS — R6889 Other general symptoms and signs: Secondary | ICD-10-CM

## 2018-10-08 LAB — RESPIRATORY VIRUS PANEL
Influenza A RNA: NOT DETECTED
Influenza B RNA: NOT DETECTED
RSV RNA: NOT DETECTED
hMPV: NOT DETECTED

## 2018-10-08 MED ORDER — DOXYCYCLINE HYCLATE 100 MG PO TABS: 100.0000 mg | ORAL_TABLET | Freq: Two times a day (BID) | ORAL | 0 refills | Status: DC

## 2018-10-08 NOTE — Patient Instructions (Addendum)
We will do a CXR today.( Stat) Will check respiratory viral panel  We will call you with results. Doxycycline 100 mg twice daily. Probiotic with antibiotic Flutter valve 4 puffs 4 times a day. Mucinex 1200 mg twice daily. ( With full glass of water). Continue Breo daily Continue Proventil as needed Follow up with [name] or NP in 2 weeks or sooner if needed

## 2018-10-08 NOTE — Progress Notes (Signed)
Reviewed and agree with assessment/plan.   Stacy Sailer, MD [facility] 08/01/2016, 12:24 PM Pager:  [phone]  

## 2018-10-08 NOTE — Progress Notes (Signed)
@Patient  ID: Jill Harper, female    DOB: 1966/05/03, 53 y.o.   MRN: [id number]  Chief Complaint  Patient presents with  . Cough    Patient stated fatigue happened Saturday. Cough does not sound the same. Congestion has a little blood in it.     Referring provider: Binnie Rail, MD  HPI 53 year old female followed in our office for bronchiectasis in the setting of rheumatoid arthritis and polymyositis  PMH: Rheumatoid arthritis, polymyositis, maintained on Imuran per rheumatology Smoker/ Smoking History: Never smoker Maintenance: Breo Ellipta 100 Pt of: [name]  Tests: HST 07/28/18 >> AHI 5, SpO2 low 83%  Chest imaging: CT chest 03/23/05 >> patchy b/l lower lung ASD with cylindrical BTX CT chest 02/17/08 >> peripheral and basilar predominant subpleural GGO CT chest 12/01/08 >> no change HRCT chest 09/07/15 >> scattered GGO 04/15/2018-CT chest high-res- spectrum of findings compatible with basilar predominant fibrotic interstitial lung disease with progression since 2017,  UIP  Pulmonarytests: PFT 10/30/11 >> FEV1 3.31 (114%), FEV1% 85, TLC 6.06 (108%), DLCO 77%, no BD PFT 11/18/12 >> FEV1 3.18 (111%), FEV1% 84, TLC 5.34 (95%), DLCO 67%, no BD PFT 12/02/15 >> FEV1 2.96 (108%), FEV1% 92, TLC 4.95 (86%), DLCO 65%, no BD  04/17/2018-spirometry with DLCO-DLCO 76%  Serology 01/31/17 >> ANA negative, RF 29, anti CCP 213, SCL 70 negative, SSA/SSB negative, ENA SM negative, Jo-1 positive Quantiferon gold 01/31/17 >> negative  Cardiac tests: Echo 05/29/17 >> EF 60 to 65%, mod LVH, grade 1 DD Echo 05/23/18 >> mod LVH, EF 60 to 65%, grade 1 DD, mild MR  Lab Results  Component Value Date   NITRICOXIDE 9 12/11/2017     PFT Results Latest Ref Rng & Units 04/17/2018 12/02/2015  FVC-Pre L 2.74 3.40  FVC-Predicted Pre % 90 99  FVC-Post L - 3.24  FVC-Predicted Post % - 94  Pre FEV1/FVC % % 90 87  Post FEV1/FCV % % - 92  FEV1-Pre L 2.46 2.97  FEV1-Predicted Pre % 101 108    FEV1-Post L - 2.96  DLCO UNC% % 76 65  DLCO COR %Predicted % 98 79  TLC L - 4.95  TLC % Predicted % - 86  RV % Predicted % - 76    OV 10/08/18 - acute cough Patient presents today with acute cough.  She states that since this past Saturday her cough has increased and is productive of darker sputum than usual.  She has noticed at times over the last couple days that her sputum has been blood-tinged.  She states that on Saturday she was very fatigued.  She states that she has been exposed to the flu at work and her husband has recently been treated for URI.  She denies any fever.  He has been compliant with Breo.  She has not needed her Proventil.  She has been compliant with Mucinex and has been using her flutter valve as directed.  She is afebrile in office today.  Her vital signs are stable in the office today.  Denies any sinus congestion.  Weight is stable.   Allergies  Allergen Reactions  . Sulfonamide Derivatives     REACTION: hives    Immunization History  Administered Date(s) Administered  . Influenza Split 04/10/2011, 06/18/2012, 05/06/2013  . Influenza Whole 05/19/2008, 05/15/2009, 06/01/2010  . Influenza,inj,Quad PF,6+ Mos 04/20/2014, 05/26/2015, 05/31/2016, 04/05/2017, 06/16/2018  . Pneumococcal Conjugate-13 01/29/2017  . Pneumococcal Polysaccharide-23 08/07/2007, 06/16/2018  . Td 07/12/2009    Past Medical History:  Diagnosis Date  . Abnormal liver function test   . Bronchiectasis       . Fibromyalgia   . Heart murmur   . Menorrhagia   . MIGRAINE HEADACHE   . MVP (mitral valve prolapse)   . Polymyositis ([place])    Dr [name]; chronic MTX  . Pulmonary fibrosis ([place])   . Rheumatoid arthritis ([place])     Tobacco History: Social History   Tobacco Use  Smoking Status Never Smoker  Smokeless Tobacco Never Used  Tobacco Comment   Married, lives with spouse. 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 albuterol (PROVENTIL HFA) 108 (90 Base) MCG/ACT inhaler Inhale 2 puffs into the lungs 4 (four) times daily as needed. Reported on 09/03/2015  . albuterol (PROVENTIL) (2.5 MG/3ML) 0.083% nebulizer solution Take 3 mLs (2.5 mg total) by nebulization every 6 (six) hours as needed for wheezing or shortness of breath.  [name] atenolol (TENORMIN) 25 MG tablet TAKE 1/2 TABLET BY MOUTH DAILY AS NEEDED FOR SYMPTOMS OF MITRAL PROLAPSE (Patient taking differently: Take 12.5 mg by mouth daily as needed (mitral prolapse). )  . azaTHIOprine (IMURAN) 50 MG tablet TAKE 2 TABLETS(100 MG) BY MOUTH DAILY  . diclofenac sodium (VOLTAREN) 1 % GEL Apply 2 g topically 4 (four) times daily as needed (pain).  [name] eletriptan (RELPAX) 20 MG tablet One tablet by mouth at onset of headache. May repeat in 2 hours if headache persists or recurs.  . fluticasone furoate-vilanterol (BREO ELLIPTA) 100-25 MCG/INH AEPB Inhale 1 puff into the lungs daily.  . folic acid (FOLVITE) 1 MG tablet Take 2 tablets (2 mg total) by mouth daily.  [name] guaiFENesin (MUCINEX) 600 MG 12 hr tablet Take 2 tablets (1,200 mg total) by mouth 2 (two) times daily as needed for cough or to loosen phlegm. (Patient taking differently: Take 1,200 mg by mouth 2 (two) times daily. )  . Multiple Vitamins-Minerals (MULTIVITAMIN GUMMIES ADULT PO) Take 2 each by mouth daily.  [name] Respiratory Therapy Supplies (FLUTTER) DEVI Use as directed (Patient taking differently: 2 (two) times daily. Use as directed)  . Vitamin D, Ergocalciferol, (DRISDOL) 50000 units CAPS capsule Take 1 capsule (50,000 Units total) by mouth every 30 (thirty) days.  [name] doxycycline (VIBRA-TABS) 100 MG tablet Take 1 tablet (100 mg total) by mouth 2 (two) times daily.  . [DISCONTINUED] cyclobenzaprine (FLEXERIL) 10 MG tablet Take 2.5 mg by  mouth at bedtime as needed for muscle spasms. Reported on 09/03/2015  . [DISCONTINUED] traMADol (ULTRAM) 50 MG tablet Take 50 mg by mouth every 6 (six) hours as needed for moderate pain. Reported on 09/03/2015  . [DISCONTINUED] Vitamin D, Ergocalciferol, (DRISDOL) 1.25 MG (50000 UT) CAPS capsule Take 1 capsule (50,000 Units total) by mouth every 7 (seven) days. (Patient not taking: Reported on 10/08/2018)   No facility-administered encounter medications on file as of 10/08/2018.      Review of Systems  Review of Systems  Constitutional: Negative.  Negative for chills and fever.  HENT: Negative.   Respiratory: Positive for cough (productive of dark yellow/blood tinged sputum). Negative for shortness of breath and wheezing.   Cardiovascular: Negative.  Negative for chest pain, palpitations and leg swelling.  Gastrointestinal: Negative.   Allergic/Immunologic: Negative.   Neurological: Negative.   Psychiatric/Behavioral: Negative.  Physical Exam  BP 126/80 (BP Location: Right Arm, Patient Position: Sitting, Cuff Size: Normal)   Pulse 81   Temp 98 F (36.7 C)   Ht 5\' 8"  (1.727 m)   Wt 279 lb 12.8 oz (126.9 kg)   SpO2 96%   BMI 42.54 kg/m   Wt Readings from Last 5 Encounters:  10/08/18 279 lb 12.8 oz (126.9 kg)  10/01/18 282 lb (127.9 kg)  09/18/18 270 lb (122.5 kg)  08/20/18 279 lb 6.4 oz (126.7 kg)  08/12/18 279 lb (126.6 kg)     Physical Exam Vitals signs and nursing note reviewed.  Constitutional:      General: She is not in acute distress.    Appearance: She is well-developed.  Cardiovascular:     Rate and Rhythm: Normal rate and regular rhythm.  Pulmonary:     Effort: Pulmonary effort is normal. No respiratory distress.     Breath sounds: Normal breath sounds. No wheezing or rhonchi.  Musculoskeletal:        General: No swelling.  Neurological:     Mental Status: She is alert and oriented to person, place, and time.        Assessment & Plan:    Bronchiectasis with (acute) exacerbation ([place]) Patient presents today for acute visit with increased cough and sputum production.  She states that her sputum has been darker than usual and blood-tinged at times.  She has been exposed to the flu and a URI recently.  Will check chest x-ray and respiratory viral panel today.  Patient Instructions  We will do a CXR today.( Stat) Will check respiratory viral panel  We will call you with results. Doxycycline 100 mg twice daily. Probiotic with antibiotic Flutter valve 4 puffs 4 times a day. Mucinex 1200 mg twice daily. ( With full glass of water). Continue Breo daily Continue Proventil as needed Follow up with [name] or NP in 2 weeks or sooner if needed       Fenton Foy, NP 10/08/2018

## 2018-10-08 NOTE — Telephone Encounter (Signed)
Spoke with pt over the phone. She has been scheduled to see Tonya today at 3pm. I have also asked the pt to call us in future when she is not feeling well instead of sending a MyChart message. She verbalized understanding. Nothing further was needed.

## 2018-10-08 NOTE — Assessment & Plan Note (Signed)
Patient presents today for acute visit with increased cough and sputum production.  She states that her sputum has been darker than usual and blood-tinged at times.  She has been exposed to the flu and a URI recently.  Will check chest x-ray and respiratory viral panel today.  Patient Instructions  We will do a CXR today.( Stat) Will check respiratory viral panel  We will call you with results. Doxycycline 100 mg twice daily. Probiotic with antibiotic Flutter valve 4 puffs 4 times a day. Mucinex 1200 mg twice daily. ( With full glass of water). Continue Breo daily Continue Proventil as needed Follow up with [name] or NP in 2 weeks or sooner if needed

## 2018-10-09 ENCOUNTER — Ambulatory Visit (INDEPENDENT_AMBULATORY_CARE_PROVIDER_SITE_OTHER): Payer: Federal, State, Local not specified - PPO | Admitting: Family Medicine

## 2018-10-09 ENCOUNTER — Encounter (INDEPENDENT_AMBULATORY_CARE_PROVIDER_SITE_OTHER): Payer: Self-pay | Admitting: Family Medicine

## 2018-10-09 VITALS — BP 111/77 | HR 78 | Ht 67.0 in | Wt 273.0 lb

## 2018-10-09 DIAGNOSIS — Z0289 Encounter for other administrative examinations: Secondary | ICD-10-CM

## 2018-10-09 DIAGNOSIS — I5189 Other ill-defined heart diseases: Secondary | ICD-10-CM

## 2018-10-09 DIAGNOSIS — Z9189 Other specified personal risk factors, not elsewhere classified: Secondary | ICD-10-CM

## 2018-10-09 DIAGNOSIS — E559 Vitamin D deficiency, unspecified: Secondary | ICD-10-CM | POA: Diagnosis not present

## 2018-10-09 DIAGNOSIS — E538 Deficiency of other specified B group vitamins: Secondary | ICD-10-CM | POA: Diagnosis not present

## 2018-10-09 DIAGNOSIS — R5383 Other fatigue: Secondary | ICD-10-CM | POA: Diagnosis not present

## 2018-10-09 DIAGNOSIS — B749 Filariasis, unspecified: Secondary | ICD-10-CM | POA: Diagnosis not present

## 2018-10-09 DIAGNOSIS — Z1331 Encounter for screening for depression: Secondary | ICD-10-CM | POA: Diagnosis not present

## 2018-10-09 DIAGNOSIS — I519 Heart disease, unspecified: Secondary | ICD-10-CM

## 2018-10-09 DIAGNOSIS — J99 Respiratory disorders in diseases classified elsewhere: Secondary | ICD-10-CM

## 2018-10-09 DIAGNOSIS — I773 Arterial fibromuscular dysplasia: Secondary | ICD-10-CM

## 2018-10-09 DIAGNOSIS — Z6841 Body Mass Index (BMI) 40.0 and over, adult: Secondary | ICD-10-CM

## 2018-10-10 LAB — COMPREHENSIVE METABOLIC PANEL
ALT: 15 IU/L (ref 0–32)
AST: 21 IU/L (ref 0–40)
Albumin/Globulin Ratio: 1.4 (ref 1.2–2.2)
Albumin: 4.6 g/dL (ref 3.8–4.9)
Alkaline Phosphatase: 100 IU/L (ref 39–117)
BUN/Creatinine Ratio: 9 (ref 9–23)
BUN: 7 mg/dL (ref 6–24)
Bilirubin Total: 0.6 mg/dL (ref 0.0–1.2)
CHLORIDE: 106 mmol/L (ref 96–106)
CO2: 17 mmol/L — ABNORMAL LOW (ref 20–29)
Calcium: 10 mg/dL (ref 8.7–10.2)
Creatinine, Ser: 0.82 mg/dL (ref 0.57–1.00)
GFR calc Af Amer: 95 mL/min/{1.73_m2} (ref >59)
GFR calc non Af Amer: 83 mL/min/{1.73_m2} (ref >59)
Globulin, Total: 3.2 g/dL (ref 1.5–4.5)
Glucose: 101 mg/dL — ABNORMAL HIGH (ref 65–99)
Potassium: 4.5 mmol/L (ref 3.5–5.2)
Sodium: 144 mmol/L (ref 134–144)
Total Protein: 7.8 g/dL (ref 6.0–8.5)

## 2018-10-10 LAB — LIPID PANEL WITH LDL/HDL RATIO
Cholesterol, Total: 141 mg/dL (ref 100–199)
HDL: 44 mg/dL (ref >39)
LDL Calculated: 74 mg/dL (ref 0–99)
LDl/HDL Ratio: 1.7 ratio (ref 0.0–3.2)
Triglycerides: 116 mg/dL (ref 0–149)
VLDL Cholesterol Cal: 23 mg/dL (ref 5–40)

## 2018-10-10 LAB — HEMOGLOBIN A1C
Est. average glucose Bld gHb Est-mCnc: 111 mg/dL
Hgb A1c MFr Bld: 5.5 % (ref 4.8–5.6)

## 2018-10-10 LAB — INSULIN, RANDOM: INSULIN: 22.4 u[IU]/mL (ref 2.6–24.9)

## 2018-10-10 LAB — VITAMIN B12: Vitamin B-12: 575 pg/mL (ref 232–1245)

## 2018-10-10 LAB — FOLATE: Folate: >20 ng/mL (ref >3.0)

## 2018-10-13 ENCOUNTER — Encounter (HOSPITAL_COMMUNITY): Payer: Self-pay

## 2018-10-13 ENCOUNTER — Encounter (HOSPITAL_COMMUNITY)
Admission: RE | Admit: 2018-10-13 | Discharge: 2018-10-13 | Disposition: A | Discharge status: Home / Self Care | Payer: Federal, State, Local not specified - PPO | Source: Ambulatory Visit | Attending: Pulmonary Disease | Admitting: Pulmonary Disease

## 2018-10-13 VITALS — BP 134/54 | HR 75 | Temp 98.4°F | Resp 18 | Ht 68.75 in | Wt 280.6 lb

## 2018-10-13 DIAGNOSIS — J849 Interstitial pulmonary disease, unspecified: Secondary | ICD-10-CM | POA: Diagnosis not present

## 2018-10-13 NOTE — Progress Notes (Signed)
Office: [phone]  /  Fax: [phone]   Dear Dr. Binnie Rail and Dr. Berta Minor B. Jaynee Eagles,    Thank you for referring LINDE WILENSKY to our clinic. The following note includes my evaluation and treatment recommendations.  HPI:   Chief Complaint: OBESITY    Jill Harper has been referred by Binnie Rail and Dr. Berta Minor B. Ahern for consultation regarding her obesity and obesity related comorbidities.    IASIA FORCIER (MR# [id number]) is a 53 y.o. female who presents on 10/09/2018 for obesity evaluation and treatment. Current BMI is Body mass index is 42.76 kg/m.  Alonnah has been struggling with her weight for many years and has been unsuccessful in either losing weight, maintaining weight loss, or reaching her healthy weight goal.     Vickey attended our information session and states she is currently in the action stage of change and ready to dedicate time achieving and maintaining a healthier weight. Cybill is interested in becoming our patient and working on intensive lifestyle modifications including (but not limited to) diet, exercise and weight loss.    Briah states her family eats meals together she thinks her family will eat healthier with her her desired weight loss is 113 lbs she started gaining weight in 2003 her heaviest weight ever was 282 lbs. she is frequently drinking liquids with calories she frequently eats larger portions than normal  she has binge eating behaviors she struggles with emotional eating    Johnny has been on phentermine in the past, but is not taking it currently.   Fatigue Bricelyn feels her energy is lower than it should be. This has worsened with weight gain and has not worsened recently. Marya denies daytime somnolence and  admits to improvement with CPAP machine. Patient has a history of obstructive sleep apnea. Patent has a history of symptoms of morning headache. Patient generally gets 8 hours of sleep per night, and states they generally have generally  restful sleep. Snoring is slightly present. Apneic episodes are present. Epworth Sleepiness Score is 9.  Pulmonary Fibrosis Jamela is currently being treated for pulmonary fibrosis by her pulmonologist. She notes increased shortness of breath with exercise as she has gained weight. Alois has a history of prednisone use, but not currently.  Vitamin D Deficiency Bryttani has a diagnosis of vitamin D deficiency. She is currently taking OTC vit D 5,000 IU per day. Her last level was 25 on 10/01/18 and was very low. She admits fatigue.  At risk for osteopenia and osteoporosis Yari is at higher risk of osteopenia and osteoporosis due to vitamin D deficiency.   Vitamin B12 Deficiency Chyler has a diagnosis of B12 insufficiency and notes fatigue. This is not a new diagnosis. She is on a multivitamin and admits fatigue. She does not have recent labs. Luciann is not a vegetarian and does not have a previous diagnosis of pernicious anemia. She does not have a history of weight loss surgery.   Folate (Folic Acid) Deficiency Hindy is on folic acid 69m per day and she admits fatigue. She does not have recent labs.  Fibromuscular Dysplasia DLorettahas fibromuscular dysplasia which was diagnosed in the cerebral arteries. She is on ASA and has had some increased blood pressure readings, but not today.  Grade I Diastolic Dysfunction DManreetwas diagnosed with Grade I diastolic dysfunction by echocardiogram in 2019. DKathreendenies chest pain, shortness of breath at rest, or orthopnea.  Depression Screen [place]'s Food and Mood (modified PHQ-9) score was  19. Depression screen PHQ 2/9 10/09/2018  Decreased Interest 3  Down, Depressed, Hopeless 3  PHQ - 2 Score 6  Altered sleeping 3  Tired, decreased energy 3  Change in appetite 1  Feeling bad or failure about yourself  3  Trouble concentrating 0  Moving slowly or fidgety/restless 3  Suicidal thoughts 0  PHQ-9 Score 19  Difficult doing work/chores Not difficult at all    ASSESSMENT AND PLAN:  Other fatigue - Plan: EKG 12-Lead, Comprehensive metabolic panel, Hemoglobin A1c, Insulin, random  Pulmonary filariasis  Vitamin D deficiency  B12 nutritional deficiency - Plan: Vitamin B51, Folate  Folic acid deficiency - Plan: Folate  Fibromuscular dysplasia (HCC)  Grade I diastolic dysfunction - Plan: Lipid Panel With LDL/HDL Ratio  Depression screening  At risk for osteoporosis  Class 3 severe obesity with serious comorbidity and body mass index (BMI) of 40.0 to 44.9 in adult, unspecified obesity type ([place])  PLAN:  Fatigue Rmoni was informed that her fatigue may be related to obesity, depression or many other causes. Labs will be ordered, and in the meanwhile Calin has agreed to work on diet, exercise and weight loss to help with fatigue. Proper sleep hygiene was discussed including the need for 7-8 hours of quality sleep each night. A sleep study was not ordered based on symptoms and Epworth score. An EKG and an indirect calorimetry was ordered today.  Pulmonary Fibrosis Jailyne will have an indirect calorimetry today and she agrees to start her diet and weight loss. We may consider pulmonary rehab in the near future. Caragh agrees to follow up in 2 weeks.  Vitamin D Deficiency Marah was informed that low vitamin D levels contributes to fatigue and are associated with obesity, breast, and colon cancer. She agrees to continue to take Vit D @5 ,000 IU every day and will follow up for routine testing of vitamin D, at least 2-3 times per year. She was informed of the risk of over-replacement of vitamin D and agrees to not increase her dose unless she discusses this with [name] first. We will recheck her labs in 3 months. Blakleigh agrees to follow up in 2 weeks.  At risk for osteopenia and osteoporosis Afsana was given extended (15 minutes) osteoporosis prevention counseling today. Breella is at risk for osteopenia and osteoporosis due to her vitamin D deficiency. She was  encouraged to take her vitamin D and follow her higher calcium diet and increase strengthening exercise to help strengthen her bones and decrease her risk of osteopenia and osteoporosis.  Vitamin B12 Deficiency Ivyanna will work on increasing B12 rich foods in her diet. B12 supplementation was not prescribed today. We will plan on checking labs today and she will follow up as directed  Folate (Folic Acid) Deficiency Kamauri will have labs drawn today and she agrees to follow up at the agreed upon time.  Fibromuscular Dysplasia [place]anti agrees to continue taking ASA and to start her diet. Labs will be ordered today. Jamiah may need to go on blood pressure medicine to protect her kidneys. Nyrah agrees to follow up in 2 weeks.  Grade I Diastolic Dysfunction Moreen agrees to start her diet and weight loss. We will continue to monitor and she agrees to follow up in 2 weeks.  Depression Screen Julyssa had a strongly positive depression screening. Depression is commonly associated with obesity and often results in emotional eating behaviors. We will monitor this closely and work on CBT to help improve the non-hunger eating patterns. Referral to Psychology  may be required if no improvement is seen as she continues in our clinic.  Obesity Hitomi is currently in the action stage of change and her goal is to continue with weight loss efforts. I recommend Maiya begin the structured treatment plan as follows:  She has agreed to follow the category 2 plan.  Dream has been instructed to eventually work up to a goal of 150 minutes of combined cardio and strengthening exercise per week for weight loss and overall health benefits. We discussed the following Behavioral Modification Strategies today: increasing lean protein intake, decreasing simple carbohydrates, decreasing sodium intake, and work on meal planning and easy cooking plans. Percilla was advised that she is never to take phentermine again with her medical history and she  voiced understanding.   She was informed of the importance of frequent follow up visits to maximize her success with intensive lifestyle modifications for her multiple health conditions. She was informed we would discuss her lab results at her next visit unless there is a critical issue that needs to be addressed sooner. Orvetta agreed to keep her next visit at the agreed upon time to discuss these results.  ALLERGIES: Allergies  Allergen Reactions  . Sulfonamide Derivatives     REACTION: hives    MEDICATIONS: Current Outpatient Medications on File Prior to Visit  Medication Sig Dispense Refill  . albuterol (PROVENTIL HFA) 108 (90 Base) MCG/ACT inhaler Inhale 2 puffs into the lungs 4 (four) times daily as needed. Reported on 09/03/2015 1 Inhaler 4  . albuterol (PROVENTIL) (2.5 MG/3ML) 0.083% nebulizer solution Take 3 mLs (2.5 mg total) by nebulization every 6 (six) hours as needed for wheezing or shortness of breath. 120 vial 5  . aspirin EC 81 MG tablet Take 81 mg by mouth daily.    [name] atenolol (TENORMIN) 25 MG tablet TAKE 1/2 TABLET BY MOUTH DAILY AS NEEDED FOR SYMPTOMS OF MITRAL PROLAPSE (Patient taking differently: Take 12.5 mg by mouth daily as needed (mitral prolapse). ) 45 tablet 1  . azaTHIOprine (IMURAN) 50 MG tablet TAKE 2 TABLETS(100 MG) BY MOUTH DAILY (Patient taking differently: Take 50 mg by mouth daily. ) 60 tablet 2  . cyclobenzaprine (FLEXERIL) 10 MG tablet Take 2.5 mg by mouth 3 (three) times daily as needed for muscle spasms.    [name] eletriptan (RELPAX) 20 MG tablet One tablet by mouth at onset of headache. May repeat in 2 hours if headache persists or recurs. 10 tablet 0  . fluticasone furoate-vilanterol (BREO ELLIPTA) 100-25 MCG/INH AEPB Inhale 1 puff into the lungs daily. 60 each 5  . folic acid (FOLVITE) 1 MG tablet Take 2 tablets (2 mg total) by mouth daily. 180 tablet 3  . guaiFENesin (MUCINEX) 600 MG 12 hr tablet Take 2 tablets (1,200 mg total) by mouth 2 (two) times daily as  needed for cough or to loosen phlegm. (Patient taking differently: Take 1,200 mg by mouth 2 (two) times daily. )    . Multiple Vitamins-Minerals (MULTIVITAMIN GUMMIES ADULT PO) Take 2 each by mouth daily.    [name] Respiratory Therapy Supplies (FLUTTER) DEVI Use as directed (Patient taking differently: 2 (two) times daily. Use as directed) 1 each 0  . traMADol (ULTRAM) 50 MG tablet Take by mouth every 6 (six) hours as needed.     No current facility-administered medications on file prior to visit.     PAST MEDICAL HISTORY: Past Medical History:  Diagnosis Date  . Abnormal liver function test   . Bronchiectasis       .  Dyspnea   . Fibromuscular dysplasia ([place])   . Fibromyalgia   . Heart murmur   . ILD (interstitial lung disease) ([place])   . Joint pain   . Menorrhagia   . MIGRAINE HEADACHE   . MVP (mitral valve prolapse)   . Osteoarthritis   . Polymyositis ([place])    Dr [name]; chronic MTX  . Pulmonary fibrosis ([place])   . Rheumatoid arthritis ([place])   . Sleep apnea   . Vitamin B 12 deficiency   . Vitamin D deficiency     PAST SURGICAL HISTORY: Past Surgical History:  Procedure Laterality Date  . ablation uterine  2010  . CARDIAC CATHETERIZATION  2002   normal  . DILATION AND CURETTAGE OF UTERUS  08-31-08   Dr [name]  . IR ANGIO INTRA EXTRACRAN SEL COM CAROTID INNOMINATE BILAT MOD SED  09/18/2018  . IR ANGIO VERTEBRAL SEL VERTEBRAL BILAT MOD SED  09/18/2018  . IR US GUIDE VASC ACCESS RIGHT  09/18/2018    SOCIAL HISTORY: Social History   Tobacco Use  . Smoking status: Never Smoker  . Smokeless tobacco: Never Used  . Tobacco comment: Married, lives with spouse. 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 Alcohol use: Never    Frequency: Never    Comment: maybe a wine cooler once every other year  . Drug use: Never    FAMILY HISTORY: Family History  Problem Relation Age of Onset  . Diabetes Mother   .  Fibromyalgia Mother   . Ulcers Mother   . Heart failure Mother   . Thyroid disease Mother   . Obesity Mother   . Multiple myeloma Father   . Hypertension Father   . Stroke Father   . Lupus Sister   . Other Sister        abdominal adhesions resulting in bowel obstruction  . Migraines Sister   . Heart disease Brother        bypass surgery  . Rheum arthritis Sister   . Multiple myeloma Sister   . Hypertension Sister   . Heart attack Sister   . Diabetes Sister   . Hypertension Sister   . Rheum arthritis Sister   . Diabetes Sister   . Diabetes Brother   . Headache Other        siblings with headaches but not diagnosed as migraines    ROS: Review of Systems  Constitutional: Positive for malaise/fatigue. Negative for weight loss.  HENT:       Positive for hoarseness.  Eyes:       Wears glasses or contacts.  Respiratory: Positive for cough, shortness of breath ( with exercise) and wheezing.        Positive for painful or difficult breathing. Positive for difficulty breathing while lying down. Positive for sudden awakening from sleep with shortness of breath.  Cardiovascular: Negative for chest pain and orthopnea.  Musculoskeletal: Positive for joint pain and myalgias.       Positive for muscle stiffness. Positive for red or swollen joints.  Skin:       Positive for hair or nail changes.  Neurological: Positive for dizziness and weakness.    PHYSICAL EXAM: Blood pressure 111/77, pulse 78, height 5' 7"  (1.702 m), weight 273 lb (123.8 kg), SpO2 98 %. Body mass index is 42.76 kg/m. Physical Exam Vitals signs reviewed.  Constitutional:      Appearance: Normal appearance. She is obese.  HENT:  Head: Normocephalic and atraumatic.     Nose: Nose normal.  Eyes:     General: No scleral icterus.    Extraocular Movements: Extraocular movements intact.  Neck:     Musculoskeletal: Normal range of motion and neck supple.     Thyroid: No thyromegaly.     Comments: Negative  for thyromegaly. Cardiovascular:     Rate and Rhythm: Normal rate and regular rhythm.     Heart sounds: Murmur present. Systolic ( harsh) murmur present with a grade of 2/6.  Pulmonary:     Effort: Pulmonary effort is normal. No respiratory distress.  Abdominal:     Palpations: Abdomen is soft.     Tenderness: There is no abdominal tenderness.     Comments: Positive for obesity.  Musculoskeletal:     Comments: ROM normal in all extremities.  Skin:    General: Skin is warm and dry.  Neurological:     Mental Status: She is alert and oriented to person, place, and time.     Coordination: Coordination normal.  Psychiatric:        Mood and Affect: Mood normal.        Behavior: Behavior normal.     RECENT LABS AND TESTS: BMET    Component Value Date/Time   NA 144 10/09/2018 1300   K 4.5 10/09/2018 1300   CL 106 10/09/2018 1300   CO2 17 (L) 10/09/2018 1300   GLUCOSE 101 (H) 10/09/2018 1300   GLUCOSE 84 09/22/2018 1407   BUN 7 10/09/2018 1300   CREATININE 0.82 10/09/2018 1300   CREATININE 0.74 09/22/2018 1407   CALCIUM 10.0 10/09/2018 1300   GFRNONAA 83 10/09/2018 1300   GFRNONAA 93 09/22/2018 1407   GFRAA 95 10/09/2018 1300   GFRAA 108 09/22/2018 1407   Lab Results  Component Value Date   HGBA1C 5.5 10/09/2018   Lab Results  Component Value Date   INSULIN 22.4 10/09/2018   CBC    Component Value Date/Time   WBC 7.9 09/22/2018 1407   RBC 5.18 (H) 09/22/2018 1407   HGB 14.5 09/22/2018 1407   HCT 44.0 09/22/2018 1407   PLT 313 09/22/2018 1407   MCV 84.9 09/22/2018 1407   MCH 28.0 09/22/2018 1407   MCHC 33.0 09/22/2018 1407   RDW 12.9 09/22/2018 1407   LYMPHSABS 1,509 09/22/2018 1407   MONOABS 666 01/31/2017 1006   EOSABS 119 09/22/2018 1407   BASOSABS 40 09/22/2018 1407   Iron/TIBC/Ferritin/ %Sat No results found for: IRON, TIBC, FERRITIN, IRONPCTSAT Lipid Panel     Component Value Date/Time   CHOL 141 10/09/2018 1300   TRIG 116 10/09/2018 1300   HDL  44 10/09/2018 1300   CHOLHDL 3 04/24/2016 0900   VLDL 11.8 04/24/2016 0900   LDLCALC 74 10/09/2018 1300   Hepatic Function Panel     Component Value Date/Time   PROT 7.8 10/09/2018 1300   ALBUMIN 4.6 10/09/2018 1300   AST 21 10/09/2018 1300   ALT 15 10/09/2018 1300   ALKPHOS 100 10/09/2018 1300   BILITOT 0.6 10/09/2018 1300   BILIDIR 0.1 04/03/2018 1457   IBILI 0.3 04/03/2018 1457      Component Value Date/Time   TSH 3.24 10/01/2018 1452   TSH 5.28 (H) 03/18/2018 1642   TSH 4.82 (H) 02/18/2018 1609   Results for ANNARAE, MACNAIR (MRN [id number]) as of 10/13/2018 09:42  Ref. Range 10/01/2018 14:52  Vitamin D, 25-Hydroxy Latest Ref Range: 30 - 100 ng/mL 25 (L)    ECG  shows NSR with a rate of 73 BPM. INDIRECT CALORIMETER done today shows a VO2 of 273 and a REE of 1899.  Her calculated basal metabolic rate is 2666 thus her basal metabolic rate is worse than expected.  OBESITY BEHAVIORAL INTERVENTION VISIT  Today's visit was # 1   Starting weight: 273 lbs Starting date: 10/09/2018 Today's weight : Weight: 273 lb (123.8 kg)  Today's date: 10/09/2018 Total lbs lost to date: 0    10/09/2018  Height 5' 7"  (1.702 m)  Weight 273 lb (123.8 kg)  BMI (Calculated) 42.75  BLOOD PRESSURE - SYSTOLIC 648  BLOOD PRESSURE - DIASTOLIC 77  Waist Measurement  50 inches   Body Fat % 49.7 %  Total Body Water (lbs) 87.2 lbs  RMR 1899   ASK: We discussed the diagnosis of obesity with Marijean Heath today and Hinton Dyer agreed to give [name] permission to discuss obesity behavioral modification therapy today.  ASSESS: Vonna has the diagnosis of obesity and her BMI today is 42.75. Zamyah is in the action stage of change.   ADVISE: Shylo was educated on the multiple health risks of obesity as well as the benefit of weight loss to improve her health. She was advised of the need for long term treatment and the importance of lifestyle modifications to improve her current health and to decrease her risk of future  health problems.  AGREE: Multiple dietary modification options and treatment options were discussed and Sonia agreed to follow the recommendations documented in the above note.  ARRANGE: Denyse was educated on the importance of frequent visits to treat obesity as outlined per CMS and USPSTF guidelines and agreed to schedule her next follow up appointment today.  IMarcille Blanco, CMA, am acting as transcriptionist for Starlyn Skeans, MD  I have reviewed the above documentation for accuracy and completeness, and I agree with the above. -Dennard Nip, MD

## 2018-10-13 NOTE — Progress Notes (Addendum)
Jill Harper 53 y.o. female Pulmonary Rehab Orientation Note Jill Harper who is referred to Pulmonary Rehab by [name] for ILD,  arrived today in Cardiac and Pulmonary Rehab for orientation to Pulmonary Rehab. She was transported from [employer] via wheel chair. She does not carry portable oxygen. Per pt, she uses oxygen never. However pt feels she needs oxygen therapy along with her CPAP for Sleep Apnea.  Pt has noted her oxygen saturation in the 80's during the night and she feels she can not get air in.  Pt uses nasal pillows verses face mask.  Pt to follow up with Dr, Halford Chessman later this month. Color good, skin warm and dry. Patient is oriented to time and place. Patient's medical history, psychosocial health, and medications reviewed. Psychosocial assessment reveals pt lives with their spouse. 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 Pt hobbies include reading, doing puzzles and spending time with her girlfreind. Pt reports her stress level is low- work related. Areas of stress/anxiety include Work and new illness Fibromuscular Dysplasia.  Pt sees neurologist - Dr. Karna Christmas.   Pt does not endorse depression.   Chancie compartmentalizes in a "box" and doesn't talk about her multiple illness other than on office visits.  After the office visit is completed she then places her illness back in a box. Pt denies that she is in denial and feels that placing things in a "box" is her way of controlling the illness not the illness controlling her. Pt received the diagnosis Fibromuscular Dysplasia  early February and has not been able to "wrap" her mind around it.  PHQ2/9 score 0/10. Pt shows poor coping skills with somehat positive outlook . Tacha offered emotional support and reassurance. Will continue to monitor and evaluate progress toward psychosocial goal(s) of Improved coping skills that are healthy and  effective. Physical assessment reveals heart rate is normal, breath sounds  expected "crackle" breath sounds that are heard with pt with Pulmonary Fibrosis in the bases and clear upper lobes and anteriorly. . Grip strength equal, strong. Pt with swelling in her hands/tight due to RA.  Distal pulses palpable with trace edema.   Patient reports she does take medications as prescribed. Patient states she follows a Regular diet. The patient reports no specific efforts to gain or lose weight. However pt is seeing [facility] Leafy Ro at [facility]. Pt is committed to making changes in her diet and initiate exercise here at [facility] and at home. Patient's weight will be monitored closely. Demonstration and practice of PLB using pulse oximeter. Patient able to return demonstration satisfactorily. Safety and hand hygiene in the exercise area reviewed with patient. Patient voices understanding of the information reviewed. Department expectations discussed with patient and achievable goals were set. The patient shows enthusiasm about attending the program and we look forward to working with this nice lady The patient is scheduled for a 6 min walk test on 10/14/2018 and to begin exercise on 10/21/2018 at 1:30 pm. 45 minutes was spent on a variety of activities such as assessment of the patient, obtaining baseline data including height, weight, BMI, and grip strength, verifying medical history, allergies, and current medications, and teaching patient strategies for performing tasks with less respiratory effort with emphasis on pursed lip breathing. Jill Harper, BSN Cardiac and Training and development officer

## 2018-10-14 ENCOUNTER — Encounter (HOSPITAL_COMMUNITY)
Admission: RE | Admit: 2018-10-14 | Discharge: 2018-10-14 | Disposition: A | Discharge status: Home / Self Care | Payer: Federal, State, Local not specified - PPO | Source: Ambulatory Visit | Attending: Pulmonary Disease | Admitting: Pulmonary Disease

## 2018-10-14 DIAGNOSIS — J849 Interstitial pulmonary disease, unspecified: Secondary | ICD-10-CM | POA: Diagnosis not present

## 2018-10-16 NOTE — Progress Notes (Signed)
Pulmonary Individual Treatment Plan  Patient Details  Name: Jill Harper MRN: [id number] Date of Birth: [birth year] Referring Provider:     Pulmonary Rehab Walk Test from 10/14/2018 in [facility]  Referring Provider  Dr. Valeta Harms      Initial Encounter Date:    Pulmonary Rehab Walk Test from 10/14/2018 in [facility]  Date  10/14/18      Visit Diagnosis: ILD (interstitial lung disease) ([place])  Patient's Home Medications on Admission:   Current Outpatient Medications:    albuterol (PROVENTIL HFA) 108 (90 Base) MCG/ACT inhaler, Inhale 2 puffs into the lungs 4 (four) times daily as needed. Reported on 09/03/2015, Disp: 1 Inhaler, Rfl: 4   albuterol (PROVENTIL) (2.5 MG/3ML) 0.083% nebulizer solution, Take 3 mLs (2.5 mg total) by nebulization every 6 (six) hours as needed for wheezing or shortness of breath., Disp: 120 vial, Rfl: 5   aspirin EC 81 MG tablet, Take 81 mg by mouth daily., Disp: , Rfl:    atenolol (TENORMIN) 25 MG tablet, TAKE 1/2 TABLET BY MOUTH DAILY AS NEEDED FOR SYMPTOMS OF MITRAL PROLAPSE (Patient taking differently: Take 12.5 mg by mouth daily as needed (mitral prolapse). ), Disp: 45 tablet, Rfl: 1   azaTHIOprine (IMURAN) 50 MG tablet, TAKE 2 TABLETS(100 MG) BY MOUTH DAILY (Patient taking differently: Take 50 mg by mouth daily. ), Disp: 60 tablet, Rfl: 2   eletriptan (RELPAX) 20 MG tablet, One tablet by mouth at onset of headache. May repeat in 2 hours if headache persists or recurs., Disp: 10 tablet, Rfl: 0   fluticasone furoate-vilanterol (BREO ELLIPTA) 100-25 MCG/INH AEPB, Inhale 1 puff into the lungs daily., Disp: 60 each, Rfl: 5   folic acid (FOLVITE) 1 MG tablet, Take 2 tablets (2 mg total) by mouth daily., Disp: 180 tablet, Rfl: 3   guaiFENesin (MUCINEX) 600 MG 12 hr tablet, Take 2 tablets (1,200 mg total) by mouth 2 (two) times daily as needed for cough or to loosen phlegm. (Patient taking differently:  Take 1,200 mg by mouth 2 (two) times daily. ), Disp: , Rfl:    Multiple Vitamins-Minerals (MULTIVITAMIN GUMMIES ADULT PO), Take 2 each by mouth daily., Disp: , Rfl:    Respiratory Therapy Supplies (FLUTTER) DEVI, Use as directed (Patient taking differently: 2 (two) times daily. Use as directed), Disp: 1 each, Rfl: 0  Past Medical History: Past Medical History:  Diagnosis Date   Abnormal liver function test    Bronchiectasis        Dyspnea    Fibromuscular dysplasia (HCC)    Fibromyalgia    Heart murmur    ILD (interstitial lung disease) (HCC)    Joint pain    Menorrhagia    MIGRAINE HEADACHE    MVP (mitral valve prolapse)    Osteoarthritis    Polymyositis (HCC)    Dr [name]; chronic MTX   Pulmonary fibrosis (HCC)    Rheumatoid arthritis ([place])    Sleep apnea    Vitamin B 12 deficiency    Vitamin D deficiency     Tobacco Use: Social History   Tobacco Use  Smoking Status Never Smoker  Smokeless Tobacco Never Used  Tobacco Comment   Married, lives with spouse. 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    Labs: Recent [employer] for ITP Cardiac and Pulmonary Rehab Latest Ref Rng & Units 07/08/2009 06/14/2011 08/28/2012 04/24/2016 10/09/2018   Cholestrol 100 -  199 mg/dL 158 133 141 110 141   LDLCALC 0 - 99 mg/dL 93 60 74 59 74   HDL >39 mg/dL 58.00 65.80 57.10 39.20 44   Trlycerides 0 - 149 mg/dL 37.0 34.0 51.0 59.0 116   Hemoglobin A1c 4.8 - 5.6 % - - - 4.8 5.5      Capillary Blood Glucose: No results found for: GLUCAP   Pulmonary Assessment Scores: Pulmonary Assessment Scores    Row Name 10/13/18 1045 10/13/18 1207 10/16/18 1012     ADL UCSD   ADL Phase  Entry  --  Entry   SOB Score total  66  62  --     CAT Score   CAT Score  28  --  --     mMRC Score   mMRC Score  --  --  3      Pulmonary Function Assessment:   Exercise Target Goals: Exercise Program Goal: Individual exercise  prescription set using results from initial 6 min walk test and THRR while considering  patients activity barriers and safety.   Exercise Prescription Goal: Initial exercise prescription builds to 30-45 minutes a day of aerobic activity, 2-3 days per week.  Home exercise guidelines will be given to patient during program as part of exercise prescription that the participant will acknowledge.  Activity Barriers & Risk Stratification: Activity Barriers & Cardiac Risk Stratification - 10/13/18 1034      Activity Barriers & Cardiac Risk Stratification   Activity Barriers  Arthritis;Shortness of Breath;Fibromyalgia   RA   Cardiac Risk Stratification  Low       6 Minute Walk: 6 Minute Walk    Row Name 10/16/18 0738 10/16/18 0910       6 Minute Walk   Phase  Initial  (Pended)   Initial    Distance  1400 feet  (Pended)   1400 feet    Walk Time  6 minutes  (Pended)   6 minutes    # of Rest Breaks  0  (Pended)   0    MPH  2.65  (Pended)   2.65    METS  3.07  (Pended)   3.07    RPE  14  (Pended)   14    Perceived Dyspnea   3  (Pended)   3    Symptoms  --  No    Resting HR  --  91 bpm    Resting BP  --  122/80    Resting Oxygen Saturation   --  97 %    Exercise Oxygen Saturation  during 6 min walk  --  96 %    Max Ex. HR  --  119 bpm    Max Ex. BP  --  150/80      Interval HR   1 Minute HR  --  102    2 Minute HR  --  114    3 Minute HR  --  114    4 Minute HR  --  115    5 Minute HR  --  119    6 Minute HR  --  114    Interval Heart Rate?  --  Yes      Interval Oxygen   Interval Oxygen?  --  Yes    Baseline Oxygen Saturation %  --  97 %    1 Minute Oxygen Saturation %  --  99 %    1 Minute Liters of Oxygen  --  0 L    2 Minute Oxygen Saturation %  --  99 %    2 Minute Liters of Oxygen  --  0 L    3 Minute Oxygen Saturation %  --  98 %    3 Minute Liters of Oxygen  --  0 L    4 Minute Oxygen Saturation %  --  96 %    4 Minute Liters of Oxygen  --  0 L    5 Minute Oxygen  Saturation %  --  98 %    5 Minute Liters of Oxygen  --  0 L    6 Minute Oxygen Saturation %  --  98 %    6 Minute Liters of Oxygen  --  0 L       Oxygen Initial Assessment: Oxygen Initial Assessment - 10/16/18 1011      Initial 6 min Walk   Oxygen Used  None      Program Oxygen Prescription   Program Oxygen Prescription  None       Oxygen Re-Evaluation:   Oxygen Discharge (Final Oxygen Re-Evaluation):   Initial Exercise Prescription: Initial Exercise Prescription - 10/16/18 1000      Date of Initial Exercise RX and Referring Provider   Date  10/14/18    Referring Provider  Dr. Valeta Harms      Recumbant Bike   Level  2    Watts  10    Minutes  17      NuStep   Level  2    SPM  80    Minutes  17    METs  1.5      Track   Laps  10    Minutes  17      Prescription Details   Frequency (times per week)  2    Duration  Progress to 45 minutes of aerobic exercise without signs/symptoms of physical distress      Intensity   THRR 40-80% of Max Heartrate  60-121    Ratings of Perceived Exertion  11-13    Perceived Dyspnea  0-4      Progression   Progression  Continue to progress workloads to maintain intensity without signs/symptoms of physical distress.      Resistance Training   Training Prescription  Yes    Weight  blue bands    Reps  10-15       Perform Capillary Blood Glucose checks as needed.  Exercise Prescription Changes:   Exercise Comments:   Exercise Goals and Review: Exercise Goals    Row Name 10/13/18 1037             Exercise Goals   Increase Physical Activity  Yes       Intervention  Provide advice, education, support and counseling about physical activity/exercise needs.;Develop an individualized exercise prescription for aerobic and resistive training based on initial evaluation findings, risk stratification, comorbidities and participant's personal goals.       Expected Outcomes  Short Term: Attend rehab on a regular basis to  increase amount of physical activity.;Long Term: Add in home exercise to make exercise part of routine and to increase amount of physical activity.;Long Term: Exercising regularly at least 3-5 days a week.       Increase Strength and Stamina  Yes       Intervention  Provide advice, education, support and counseling about physical activity/exercise needs.;Develop an individualized exercise prescription for aerobic and resistive training based on initial  evaluation findings, risk stratification, comorbidities and participant's personal goals.       Expected Outcomes  Short Term: Increase workloads from initial exercise prescription for resistance, speed, and METs.;Short Term: Perform resistance training exercises routinely during rehab and add in resistance training at home;Long Term: Improve cardiorespiratory fitness, muscular endurance and strength as measured by increased METs and functional capacity (6MWT)       Able to understand and use rate of perceived exertion (RPE) scale  Yes       Intervention  Provide education and explanation on how to use RPE scale       Expected Outcomes  Short Term: Able to use RPE daily in rehab to express subjective intensity level;Long Term:  Able to use RPE to guide intensity level when exercising independently       Able to understand and use Dyspnea scale  Yes       Intervention  Provide education and explanation on how to use Dyspnea scale       Expected Outcomes  Short Term: Able to use Dyspnea scale daily in rehab to express subjective sense of shortness of breath during exertion;Long Term: Able to use Dyspnea scale to guide intensity level when exercising independently       Knowledge and understanding of Target Heart Rate Range (THRR)  Yes       Intervention  Provide education and explanation of THRR including how the numbers were predicted and where they are located for reference       Expected Outcomes  Short Term: Able to state/look up THRR;Short Term: Able to  use daily as guideline for intensity in rehab;Long Term: Able to use THRR to govern intensity when exercising independently       Understanding of Exercise Prescription  Yes       Intervention  Provide education, explanation, and written materials on patient's individual exercise prescription       Expected Outcomes  Short Term: Able to explain program exercise prescription;Long Term: Able to explain home exercise prescription to exercise independently          Exercise Goals Re-Evaluation :   Discharge Exercise Prescription (Final Exercise Prescription Changes):   Nutrition:  Target Goals: Understanding of nutrition guidelines, daily intake of sodium 1500mg , cholesterol 200mg , calories 30% from fat and 7% or less from saturated fats, daily to have 5 or more servings of fruits and vegetables.  Biometrics:    Nutrition Therapy Plan and Nutrition Goals:   Nutrition Assessments:   Nutrition Goals Re-Evaluation:   Nutrition Goals Discharge (Final Nutrition Goals Re-Evaluation):   Psychosocial: Target Goals: Acknowledge presence or absence of significant depression and/or stress, maximize coping skills, provide positive support system. Participant is able to verbalize types and ability to use techniques and skills needed for reducing stress and depression.  Initial Review & Psychosocial Screening: Initial Psych Review & Screening - 10/13/18 1210      Family Dynamics   Comments  Some stress with work at the [employer].  [place] talks with customers who are often disgruntle and the conversation can become tense.       Quality of Life Scores:  Scores of 19 and below usually indicate a poorer quality of life in these areas.  A difference of  2-3 points is a clinically meaningful difference.  A difference of 2-3 points in the total score of the Quality of Life Index has been associated with significant improvement in overall quality of life, self-image, physical symptoms, and  general health in studies assessing change in quality of life.  PHQ-9: Recent Review Flowsheet Data    Depression screen [facility] 2/9 10/13/2018 10/09/2018 04/05/2017   Decreased Interest 0 3 0   Down, Depressed, Hopeless 0 3 0   PHQ - 2 Score 0 6 0   Altered sleeping 3  3 -   Tired, decreased energy 3 3 -   Change in appetite 1 1 -   Feeling bad or failure about yourself  0 3 -   Trouble concentrating 0 0 -   Moving slowly or fidgety/restless 3 3 -   Suicidal thoughts 0 0 -   PHQ-9 Score 10 19 -   Difficult doing work/chores Somewhat difficult Not difficult at all -     Interpretation of Total Score  Total Score Depression Severity:  1-4 = Minimal depression, 5-9 = Mild depression, 10-14 = Moderate depression, 15-19 = Moderately severe depression, 20-27 = Severe depression   Psychosocial Evaluation and Intervention:   Psychosocial Re-Evaluation:   Psychosocial Discharge (Final Psychosocial Re-Evaluation):   Education: Education Goals: Education classes will be provided on a weekly basis, covering required topics. Participant will state understanding/return demonstration of topics presented.  Learning Barriers/Preferences: Learning Barriers/Preferences - 10/13/18 1058      Learning Barriers/Preferences   Learning Barriers  (S) Sight   readers and contact lens   Learning Preferences  Written Material;Group Instruction;Individual Instruction       Education Topics: Risk Factor Reduction:  -Group instruction that is supported by a PowerPoint presentation. Instructor discusses the definition of a risk factor, different risk factors for pulmonary disease, and how the heart and lungs work together.     Nutrition for Pulmonary Patient:  -Group instruction provided by PowerPoint slides, verbal discussion, and written materials to support subject matter. The instructor gives an explanation and review of healthy diet recommendations, which includes a discussion on weight management,  recommendations for fruit and vegetable consumption, as well as protein, fluid, caffeine, fiber, sodium, sugar, and alcohol. Tips for eating when patients are short of breath are discussed.   Pursed Lip Breathing:  -Group instruction that is supported by demonstration and informational handouts. Instructor discusses the benefits of pursed lip and diaphragmatic breathing and detailed demonstration on how to preform both.     Oxygen Safety:  -Group instruction provided by PowerPoint, verbal discussion, and written material to support subject matter. There is an overview of What is Oxygen and Why do we need it.  Instructor also reviews how to create a safe environment for oxygen use, the importance of using oxygen as prescribed, and the risks of noncompliance. There is a brief discussion on traveling with oxygen and resources the patient may utilize.   Oxygen Equipment:  -Group instruction provided by [facility] Staff utilizing handouts, written materials, and equipment demonstrations.   Signs and Symptoms:  -Group instruction provided by written material and verbal discussion to support subject matter. Warning signs and symptoms of infection, stroke, and heart attack are reviewed and when to call the physician/911 reinforced. Tips for preventing the spread of infection discussed.   Advanced Directives:  -Group instruction provided by verbal instruction and written material to support subject matter. Instructor reviews Advanced Directive laws and proper instruction for filling out document.   Pulmonary Video:  -Group video education that reviews the importance of medication and oxygen compliance, exercise, good nutrition, pulmonary hygiene, and pursed lip and diaphragmatic breathing for the pulmonary patient.   Exercise for the Pulmonary Patient:  -  Group instruction that is supported by a PowerPoint presentation. Instructor discusses benefits of exercise, core components of exercise,  frequency, duration, and intensity of an exercise routine, importance of utilizing pulse oximetry during exercise, safety while exercising, and options of places to exercise outside of rehab.     Pulmonary Medications:  -Verbally interactive group education provided by instructor with focus on inhaled medications and proper administration.   Anatomy and Physiology of the Respiratory System and Intimacy:  -Group instruction provided by PowerPoint, verbal discussion, and written material to support subject matter. Instructor reviews respiratory cycle and anatomical components of the respiratory system and their functions. Instructor also reviews differences in obstructive and restrictive respiratory diseases with examples of each. Intimacy, Sex, and Sexuality differences are reviewed with a discussion on how relationships can change when diagnosed with pulmonary disease. Common sexual concerns are reviewed.   MD DAY -A group question and answer session with a medical doctor that allows participants to ask questions that relate to their pulmonary disease state.   OTHER EDUCATION -Group or individual verbal, written, or video instructions that support the educational goals of the pulmonary rehab program.   Holiday Eating Survival Tips:  -Group instruction provided by PowerPoint slides, verbal discussion, and written materials to support subject matter. The instructor gives patients tips, tricks, and techniques to help them not only survive but enjoy the holidays despite the onslaught of food that accompanies the holidays.   Knowledge Questionnaire Score: Knowledge Questionnaire Score - 10/13/18 1102      Knowledge Questionnaire Score   Pre Score  15.5       Core Components/Risk Factors/Patient Goals at Admission: Personal Goals and Risk Factors at Admission - 10/13/18 1109      Core Components/Risk Factors/Patient Goals on Admission    Weight Management  Yes;Weight Loss    Admit  Weight  280 lb 10.3 oz (127.3 kg)    Goal Weight: Short Term  270 lb (122.5 kg)    Goal Weight: Long Term  260 lb (117.9 kg)    Expected Outcomes  Understanding of distribution of calorie intake throughout the day with the consumption of 4-5 meals/snacks;Understanding recommendations for meals to include 15-35% energy as protein, 25-35% energy from fat, 35-60% energy from carbohydrates, less than 200mg  of dietary cholesterol, 20-35 gm of total fiber daily;Weight Loss: Understanding of general recommendations for a balanced deficit meal plan, which promotes 1-2 lb weight loss per week and includes a negative energy balance of [phone] kcal/d;Long Term: Adherence to nutrition and physical activity/exercise program aimed toward attainment of established weight goal;Short Term: Continue to assess and modify interventions until short term weight is achieved    Improve shortness of breath with ADL's  Yes    Intervention  Provide education, individualized exercise plan and daily activity instruction to help decrease symptoms of SOB with activities of daily living.    Expected Outcomes  Short Term: Improve cardiorespiratory fitness to achieve a reduction of symptoms when performing ADLs;Long Term: Be able to perform more ADLs without symptoms or delay the onset of symptoms       Core Components/Risk Factors/Patient Goals Review:    Core Components/Risk Factors/Patient Goals at Discharge (Final Review):    ITP Comments: ITP Comments    Row Name 10/13/18 1032           ITP Comments  Dr. Manfred Arch, Medical Director Pulmonary Rehab          Comments:

## 2018-10-16 NOTE — Progress Notes (Signed)
Subjective:    Patient ID: Jill Harper, female    DOB: 06-04-1966, 53 y.o.   MRN: [id number]  HPI The patient is here for an acute visit.   Elevated BP, fibromuscular dysplasia:  She has had intermittently elevated BP.  At home her BP is 140/70's.  She does get headaches intermittently, typically with activity-that is how the fibromuscular dysplasia was diagnosed.  She has had some lightheadedness at times as well.  She denies any chest pain, palpitations or leg swelling.  She does take half of atenolol as needed and that drug makes her very drowsy, so she needed something for her blood pressure she would like to avoid that medication.    She is using cpap nightly.      Medications and allergies reviewed with patient and updated if appropriate.  Patient Active Problem List   Diagnosis Date Noted  . Bronchiectasis with (acute) exacerbation ([place]) 10/08/2018  . OSA (obstructive sleep apnea) 08/20/2018  . Rheumatoid arthritis ([place]) 04/17/2018  . ILD (interstitial lung disease) ([place]) 04/17/2018  . Dyspnea 04/01/2018  . Healthcare maintenance 04/01/2018  . Elevated TSH 03/18/2018  . Non-intractable vomiting without nausea 03/18/2018  . Dysphagia 03/18/2018  . Fever 09/13/2017  . Bronchiectasis ([place]) 09/02/2017  . LVH (left ventricular hypertrophy) 06/02/2017  . Mitral regurgitation 05/22/2017  . Wheezing 04/05/2017  . Jo-1 antibody positive 01/29/2017  . Primary osteoarthritis of both feet 01/29/2017  . Primary osteoarthritis of both hands 01/29/2017  . Other fatigue 01/29/2017  . Trapezoid ligament sprain 01/17/2017  . Left shoulder tendonitis 01/17/2017  . Vitamin D deficiency 04/26/2016  . High risk medication use 03/26/2016  . Frozen shoulder, right 03/26/2016  . Morbid obesity ([place]) 03/26/2016  . Cough 07/18/2015  . Neck mass 08/27/2013  . MVP (mitral valve prolapse)   . Erythema nodosum   . Throat clearing 05/07/2012  . INCONTINENCE, URGE 05/26/2010  . Migraine  headache 02/15/2010  . Fibromyalgia 06/20/2009  . Polymyositis ([place]) 02/11/2008  . BRONCHIECTASIS 02/10/2008    Current Outpatient Medications on File Prior to Visit  Medication Sig Dispense Refill  . albuterol (PROVENTIL HFA) 108 (90 Base) MCG/ACT inhaler Inhale 2 puffs into the lungs 4 (four) times daily as needed. Reported on 09/03/2015 1 Inhaler 4  . albuterol (PROVENTIL) (2.5 MG/3ML) 0.083% nebulizer solution Take 3 mLs (2.5 mg total) by nebulization every 6 (six) hours as needed for wheezing or shortness of breath. 120 vial 5  . aspirin EC 81 MG tablet Take 81 mg by mouth daily.    [name] atenolol (TENORMIN) 25 MG tablet TAKE 1/2 TABLET BY MOUTH DAILY AS NEEDED FOR SYMPTOMS OF MITRAL PROLAPSE (Patient taking differently: Take 12.5 mg by mouth daily as needed (mitral prolapse). ) 45 tablet 1  . azaTHIOprine (IMURAN) 50 MG tablet TAKE 2 TABLETS(100 MG) BY MOUTH DAILY (Patient taking differently: Take 50 mg by mouth daily. ) 60 tablet 2  . eletriptan (RELPAX) 20 MG tablet One tablet by mouth at onset of headache. May repeat in 2 hours if headache persists or recurs. 10 tablet 0  . fluticasone furoate-vilanterol (BREO ELLIPTA) 100-25 MCG/INH AEPB Inhale 1 puff into the lungs daily. 60 each 5  . folic acid (FOLVITE) 1 MG tablet Take 2 tablets (2 mg total) by mouth daily. 180 tablet 3  . guaiFENesin (MUCINEX) 600 MG 12 hr tablet Take 2 tablets (1,200 mg total) by mouth 2 (two) times daily as needed for cough or to loosen phlegm. (Patient taking differently:  Take 1,200 mg by mouth 2 (two) times daily. )    . Multiple Vitamins-Minerals (MULTIVITAMIN GUMMIES ADULT PO) Take 2 each by mouth daily.    [name] Respiratory Therapy Supplies (FLUTTER) DEVI Use as directed (Patient taking differently: 2 (two) times daily. Use as directed) 1 each 0   No current facility-administered medications on file prior to visit.     Past Medical History:  Diagnosis Date  . Abnormal liver function test   . Bronchiectasis        . Dyspnea   . Fibromuscular dysplasia ([place])   . Fibromyalgia   . Heart murmur   . ILD (interstitial lung disease) ([place])   . Joint pain   . Menorrhagia   . MIGRAINE HEADACHE   . MVP (mitral valve prolapse)   . Osteoarthritis   . Polymyositis ([place])    Dr [name]; chronic MTX  . Pulmonary fibrosis ([place])   . Rheumatoid arthritis ([place])   . Sleep apnea   . Vitamin B 12 deficiency   . Vitamin D deficiency     Past Surgical History:  Procedure Laterality Date  . ablation uterine  2010  . CARDIAC CATHETERIZATION  2002   normal  . DILATION AND CURETTAGE OF UTERUS  08-31-08   Dr [name]  . IR ANGIO INTRA EXTRACRAN SEL COM CAROTID INNOMINATE BILAT MOD SED  09/18/2018  . IR ANGIO VERTEBRAL SEL VERTEBRAL BILAT MOD SED  09/18/2018  . IR US GUIDE VASC ACCESS RIGHT  09/18/2018    Social History   Socioeconomic History  . Marital status: Married    Spouse name: Gissel Keilman  . Number of children: 1  . Years of education: Not on file  . Highest education level: High school graduate  Occupational History  . 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 Financial resource strain: Not on file  . Food insecurity:    Worry: Not on file    Inability: Not on file  . Transportation needs:    Medical: Not on file    Non-medical: Not on file  Tobacco Use  . Smoking status: Never Smoker  . Smokeless tobacco: Never Used  . Tobacco comment: Married, lives with spouse. 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 Alcohol use: Never    Frequency: Never    Comment: maybe a wine cooler once every other year  . Drug use: Never  . Sexual activity: Yes    Birth control/protection: Surgical  Lifestyle  . Physical activity:    Days per week: Not on file    Minutes per session: Not on file  . Stress: Not on file  Relationships  . Social connections:    Talks on phone: Not on file    Gets together: Not on file     Attends religious service: Not on file    Active member of club or organization: Not on file    Attends meetings of clubs or organizations: Not on file    Relationship status: Not on file  Other Topics Concern  . Not on file  Social History Narrative   Exercise: trying to walk - limited by fatigue   Lives at home with husband and daughter   Right handed   Caffeine: occasional coke 1-2x per month    Family History  Problem Relation Age of Onset  . Diabetes Mother   . Fibromyalgia Mother   . Ulcers Mother   .  Heart failure Mother   . Thyroid disease Mother   . Obesity Mother   . Multiple myeloma Father   . Hypertension Father   . Stroke Father   . Lupus Sister   . Other Sister        abdominal adhesions resulting in bowel obstruction  . Migraines Sister   . Heart disease Brother        bypass surgery  . Rheum arthritis Sister   . Multiple myeloma Sister   . Hypertension Sister   . Heart attack Sister   . Diabetes Sister   . Hypertension Sister   . Rheum arthritis Sister   . Diabetes Sister   . Diabetes Brother   . Headache Other        siblings with headaches but not diagnosed as migraines    Review of Systems  Constitutional: Negative for chills and fever.  Respiratory: Positive for shortness of breath (related to pulmonary fibrosis).   Cardiovascular: Negative for chest pain, palpitations and leg swelling.  Neurological: Positive for light-headedness (at times) and headaches.       Objective:   Vitals:   10/17/18 1437  BP: 140/72  Pulse: 80  Resp: 16  Temp: 99.2 F (37.3 C)  SpO2: 97%   BP Readings from Last 3 Encounters:  10/17/18 140/72  10/13/18 (!) 134/54  10/09/18 111/77   Wt Readings from Last 3 Encounters:  10/17/18 280 lb 6.4 oz (127.2 kg)  10/13/18 280 lb 10.3 oz (127.3 kg)  10/09/18 273 lb (123.8 kg)   Body mass index is 42.01 kg/m.   Physical Exam    Constitutional: Appears well-developed and well-nourished. No distress.   HENT:  Head: Normocephalic and atraumatic.  Neck: Neck supple. No tracheal deviation present. No thyromegaly present.  No cervical lymphadenopathy Cardiovascular: Normal rate, regular rhythm and normal heart sounds.   2/6 systloic  murmur heard. No carotid bruit .  No edema Pulmonary/Chest: Effort normal and breath sounds normal. No respiratory distress. No has no wheezes. No rales.  Skin: Skin is warm and dry. Not diaphoretic.  Psychiatric: Normal mood and affect. Behavior is normal.       Assessment & Plan:    See Problem List for Assessment and Plan of chronic medical problems.

## 2018-10-17 ENCOUNTER — Encounter: Payer: Self-pay | Admitting: Internal Medicine

## 2018-10-17 ENCOUNTER — Ambulatory Visit: Payer: Federal, State, Local not specified - PPO | Admitting: Internal Medicine

## 2018-10-17 ENCOUNTER — Other Ambulatory Visit: Payer: Self-pay

## 2018-10-17 VITALS — BP 140/72 | HR 80 | Temp 99.2°F | Resp 16 | Ht 68.5 in | Wt 280.4 lb

## 2018-10-17 DIAGNOSIS — I10 Essential (primary) hypertension: Secondary | ICD-10-CM | POA: Insufficient documentation

## 2018-10-17 DIAGNOSIS — I773 Arterial fibromuscular dysplasia: Secondary | ICD-10-CM | POA: Diagnosis not present

## 2018-10-17 MED ORDER — TELMISARTAN 20 MG PO TABS: 20.0000 mg | ORAL_TABLET | Freq: Every day | ORAL | 5 refills | Status: DC

## 2018-10-17 NOTE — Assessment & Plan Note (Signed)
Has seen cardiology 

## 2018-10-17 NOTE — Assessment & Plan Note (Signed)
New Start olmesartan 20 mg daily She will continue to monitor her blood pressure at home and she will have multiple opportunities to have it checked at pulmonary rehab and her other doctor visits Work will be done regularly at the weight management clinic so we can monitor her potassium and kidney function She will also monitor blood pressure at home and let me know how it is Follow-up in 6 months sooner if needed

## 2018-10-17 NOTE — Patient Instructions (Signed)
  Tests ordered today. Your results will be released to [place] (or called to you) after review, usually within 72hours after test completion. If any changes need to be made, you will be notified at that same time.   Medications reviewed and updated.  Changes include :   Start Telmisartan 20 mg daily.   Your prescription(s) have been submitted to your pharmacy. Please take as directed and contact our office if you believe you are having problem(s) with the medication(s).   Please followup in 6 months

## 2018-10-21 ENCOUNTER — Ambulatory Visit (HOSPITAL_COMMUNITY): Payer: Federal, State, Local not specified - PPO

## 2018-10-23 ENCOUNTER — Encounter (INDEPENDENT_AMBULATORY_CARE_PROVIDER_SITE_OTHER): Payer: Self-pay | Admitting: Family Medicine

## 2018-10-23 ENCOUNTER — Other Ambulatory Visit: Payer: Self-pay

## 2018-10-23 ENCOUNTER — Ambulatory Visit (HOSPITAL_COMMUNITY): Payer: Federal, State, Local not specified - PPO

## 2018-10-23 ENCOUNTER — Ambulatory Visit (INDEPENDENT_AMBULATORY_CARE_PROVIDER_SITE_OTHER): Payer: Federal, State, Local not specified - PPO | Admitting: Family Medicine

## 2018-10-23 VITALS — BP 114/75 | HR 65 | Temp 97.8°F | Ht 67.0 in | Wt 272.0 lb

## 2018-10-23 DIAGNOSIS — E559 Vitamin D deficiency, unspecified: Secondary | ICD-10-CM

## 2018-10-23 DIAGNOSIS — R7303 Prediabetes: Secondary | ICD-10-CM | POA: Diagnosis not present

## 2018-10-23 DIAGNOSIS — Z6841 Body Mass Index (BMI) 40.0 and over, adult: Secondary | ICD-10-CM

## 2018-10-23 DIAGNOSIS — Z9189 Other specified personal risk factors, not elsewhere classified: Secondary | ICD-10-CM

## 2018-10-23 MED ORDER — METFORMIN HCL 500 MG PO TABS: 500.0000 mg | ORAL_TABLET | Freq: Every day | ORAL | 0 refills | Status: DC

## 2018-10-23 NOTE — Progress Notes (Signed)
Office: [phone]  /  Fax: [phone]   HPI:   Chief Complaint: OBESITY Jill Harper is here to discuss her progress with her obesity treatment plan. She is on the Category 2 plan and is following her eating plan approximately 100 % of the time. She states she is exercising 0 minutes 0 times per week. Jill Harper started on her Category 2 plan but made a lot of substitutions. She struggled a little to eat all of her food but thought hunger got better controlled over time.  Her weight is 272 lb (123.4 kg) today and has had a weight loss of 1 pounds over a period of 2 weeks since her last visit. She has lost 1 lbs since starting treatment with [name].  Vitamin D deficiency Jill Harper has a diagnosis of vitamin D deficiency. She is restarted OTC Vit D 5000 IU and denies nausea, vomiting or muscle weakness. She is not at goal. She reports fatigue.  Pre-Diabetes Jill Harper has a new diagnosis of prediabetes with increased fasting glucose and insulin and was informed this puts her at greater risk of developing diabetes. She is not taking metformin currently and continues to work on diet and exercise to decrease risk of diabetes. She reports polyphagia but this improved in the last week with her Category 2 plan.  At risk for diabetes Jill Harper is at higher than average risk for developing diabetes due to her obesity. She currently denies polyuria or polydipsia.  ASSESSMENT AND PLAN:  Vitamin D deficiency  Prediabetes - Plan: metFORMIN (GLUCOPHAGE) 500 MG tablet  At risk for diabetes mellitus  Class 3 severe obesity with serious comorbidity and body mass index (BMI) of 40.0 to 44.9 in adult, unspecified obesity type ([place])  PLAN:  Vitamin D Deficiency Jill Harper was informed that low vitamin D levels contributes to fatigue and are associated with obesity, breast, and colon cancer. She agrees to continue to take OTC Vit D and will follow up for routine testing of vitamin D, at least 2-3 times per year. She was informed of the  risk of over-replacement of vitamin D and agrees to not increase her dose unless she discusses this with [name] first. We will recheck level in 2 months. Jill Harper agrees to follow up with our clinic in 2 weeks.  Pre-Diabetes Jill Harper will continue to work on weight loss, exercise, and decreasing simple carbohydrates in her diet to help decrease the risk of diabetes. We dicussed metformin including benefits and risks. She was informed that eating too many simple carbohydrates or too many calories at one sitting increases the likelihood of GI side effects. Jill Harper will begin taking metformin 500 mg qam #30 with no refills for now and a prescription was written today. Jill Harper agrees to follow up with our clinic in 2 weeks.  Diabetes risk counseling Jill Harper was given extended (15 minutes) diabetes prevention counseling today. She is 53 y.o. female and has risk factors for diabetes including obesity. We discussed intensive lifestyle modifications today with an emphasis on weight loss as well as increasing exercise and decreasing simple carbohydrates in her diet.  Obesity Jill Harper is currently in the action stage of change. As such, her goal is to continue with weight loss efforts She has agreed to follow the Category 2 plan Jill Harper has been instructed to work up to a goal of 150 minutes of combined cardio and strengthening exercise per week for weight loss and overall health benefits. We discussed the following Behavioral Modification Strategies today: increasing lean protein intake, decreasing simple carbohydrates  and work on meal planning and easy Jill Harper has agreed to follow up with our clinic in 2 weeks. She was informed of the importance of frequent follow up visits to maximize her success with intensive lifestyle modifications for her multiple health conditions.  ALLERGIES: Allergies  Allergen Reactions  . Sulfonamide Derivatives     REACTION: hives    MEDICATIONS: Current Outpatient Medications on File  Prior to Visit  Medication Sig Dispense Refill  . albuterol (PROVENTIL HFA) 108 (90 Base) MCG/ACT inhaler Inhale 2 puffs into the lungs 4 (four) times daily as needed. Reported on 09/03/2015 1 Inhaler 4  . albuterol (PROVENTIL) (2.5 MG/3ML) 0.083% nebulizer solution Take 3 mLs (2.5 mg total) by nebulization every 6 (six) hours as needed for wheezing or shortness of breath. 120 vial 5  . aspirin EC 81 MG tablet Take 81 mg by mouth daily.    [name] atenolol (TENORMIN) 25 MG tablet TAKE 1/2 TABLET BY MOUTH DAILY AS NEEDED FOR SYMPTOMS OF MITRAL PROLAPSE (Patient taking differently: Take 12.5 mg by mouth daily as needed (mitral prolapse). ) 45 tablet 1  . azaTHIOprine (IMURAN) 50 MG tablet TAKE 2 TABLETS(100 MG) BY MOUTH DAILY (Patient taking differently: Take 50 mg by mouth daily. ) 60 tablet 2  . eletriptan (RELPAX) 20 MG tablet One tablet by mouth at onset of headache. May repeat in 2 hours if headache persists or recurs. 10 tablet 0  . fluticasone furoate-vilanterol (BREO ELLIPTA) 100-25 MCG/INH AEPB Inhale 1 puff into the lungs daily. 60 each 5  . folic acid (FOLVITE) 1 MG tablet Take 2 tablets (2 mg total) by mouth daily. 180 tablet 3  . guaiFENesin (MUCINEX) 600 MG 12 hr tablet Take 2 tablets (1,200 mg total) by mouth 2 (two) times daily as needed for cough or to loosen phlegm. (Patient taking differently: Take 1,200 mg by mouth 2 (two) times daily. )    . Multiple Vitamins-Minerals (MULTIVITAMIN GUMMIES ADULT PO) Take 2 each by mouth daily.    [name] Respiratory Therapy Supplies (FLUTTER) DEVI Use as directed (Patient taking differently: 2 (two) times daily. Use as directed) 1 each 0  . telmisartan (MICARDIS) 20 MG tablet Take 1 tablet (20 mg total) by mouth daily. 30 tablet 5   No current facility-administered medications on file prior to visit.     PAST MEDICAL HISTORY: Past Medical History:  Diagnosis Date  . Abnormal liver function test   . Bronchiectasis       . Dyspnea   . Fibromuscular  dysplasia ([place])   . Fibromyalgia   . Heart murmur   . ILD (interstitial lung disease) ([place])   . Joint pain   . Menorrhagia   . MIGRAINE HEADACHE   . MVP (mitral valve prolapse)   . Osteoarthritis   . Polymyositis ([place])    Dr [name]; chronic MTX  . Pulmonary fibrosis ([place])   . Rheumatoid arthritis ([place])   . Sleep apnea   . Vitamin B 12 deficiency   . Vitamin D deficiency     PAST SURGICAL HISTORY: Past Surgical History:  Procedure Laterality Date  . ablation uterine  2010  . CARDIAC CATHETERIZATION  2002   normal  . DILATION AND CURETTAGE OF UTERUS  08-31-08   Dr [name]  . IR ANGIO INTRA EXTRACRAN SEL COM CAROTID INNOMINATE BILAT MOD SED  09/18/2018  . IR ANGIO VERTEBRAL SEL VERTEBRAL BILAT MOD SED  09/18/2018  . IR US GUIDE VASC ACCESS RIGHT  09/18/2018    SOCIAL HISTORY: Social History   Tobacco Use  . Smoking status: Never Smoker  . Smokeless tobacco: Never Used  . Tobacco comment: Married, lives with spouse. 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 Alcohol use: Never    Frequency: Never    Comment: maybe a wine cooler once every other year  . Drug use: Never    FAMILY HISTORY: Family History  Problem Relation Age of Onset  . Diabetes Mother   . Fibromyalgia Mother   . Ulcers Mother   . Heart failure Mother   . Thyroid disease Mother   . Obesity Mother   . Multiple myeloma Father   . Hypertension Father   . Stroke Father   . Lupus Sister   . Other Sister        abdominal adhesions resulting in bowel obstruction  . Migraines Sister   . Heart disease Brother        bypass surgery  . Rheum arthritis Sister   . Multiple myeloma Sister   . Hypertension Sister   . Heart attack Sister   . Diabetes Sister   . Hypertension Sister   . Rheum arthritis Sister   . Diabetes Sister   . Diabetes Brother   . Headache Other        siblings with headaches but not diagnosed as migraines     ROS: Review of Systems  Constitutional: Positive for malaise/fatigue and weight loss.  Gastrointestinal: Negative for nausea and vomiting.  Genitourinary:       Negative for polyuria  Musculoskeletal:       Negative for muscle weakness  Endo/Heme/Allergies:       Negative for hypoglycemia Negative for polyphagia    PHYSICAL EXAM: Blood pressure 114/75, pulse 65, temperature 97.8 F (36.6 C), temperature source Oral, height 5' 7" (1.702 m), weight 272 lb (123.4 kg), SpO2 99 %. Body mass index is 42.6 kg/m. Physical Exam Vitals signs reviewed.  Constitutional:      Appearance: Normal appearance. She is obese.  Cardiovascular:     Rate and Rhythm: Normal rate.     Pulses: Normal pulses.  Pulmonary:     Effort: Pulmonary effort is normal.  Musculoskeletal: Normal range of motion.  Skin:    General: Skin is warm and dry.  Neurological:     Mental Status: She is alert and oriented to person, place, and time.  Psychiatric:        Mood and Affect: Mood normal.        Behavior: Behavior normal.     RECENT LABS AND TESTS: BMET    Component Value Date/Time   NA 144 10/09/2018 1300   K 4.5 10/09/2018 1300   CL 106 10/09/2018 1300   CO2 17 (L) 10/09/2018 1300   GLUCOSE 101 (H) 10/09/2018 1300   GLUCOSE 84 09/22/2018 1407   BUN 7 10/09/2018 1300   CREATININE 0.82 10/09/2018 1300   CREATININE 0.74 09/22/2018 1407   CALCIUM 10.0 10/09/2018 1300   GFRNONAA 83 10/09/2018 1300   GFRNONAA 93 09/22/2018 1407   GFRAA 95 10/09/2018 1300   GFRAA 108 09/22/2018 1407   Lab Results  Component Value Date   HGBA1C 5.5 10/09/2018   HGBA1C 4.8 04/24/2016   Lab Results  Component Value Date   INSULIN 22.4 10/09/2018   CBC    Component Value Date/Time   WBC 7.9 09/22/2018 1407   RBC 5.18 (H)  09/22/2018 1407   HGB 14.5 09/22/2018 1407   HCT 44.0 09/22/2018 1407   PLT 313 09/22/2018 1407   MCV 84.9 09/22/2018 1407   MCH 28.0 09/22/2018 1407   MCHC 33.0 09/22/2018 1407   RDW  12.9 09/22/2018 1407   LYMPHSABS 1,509 09/22/2018 1407   MONOABS 666 01/31/2017 1006   EOSABS 119 09/22/2018 1407   BASOSABS 40 09/22/2018 1407   Iron/TIBC/Ferritin/ %Sat No results found for: IRON, TIBC, FERRITIN, IRONPCTSAT Lipid Panel     Component Value Date/Time   CHOL 141 10/09/2018 1300   TRIG 116 10/09/2018 1300   HDL 44 10/09/2018 1300   CHOLHDL 3 04/24/2016 0900   VLDL 11.8 04/24/2016 0900   LDLCALC 74 10/09/2018 1300   Hepatic Function Panel     Component Value Date/Time   PROT 7.8 10/09/2018 1300   ALBUMIN 4.6 10/09/2018 1300   AST 21 10/09/2018 1300   ALT 15 10/09/2018 1300   ALKPHOS 100 10/09/2018 1300   BILITOT 0.6 10/09/2018 1300   BILIDIR 0.1 04/03/2018 1457   IBILI 0.3 04/03/2018 1457      Component Value Date/Time   TSH 3.24 10/01/2018 1452   TSH 5.28 (H) 03/18/2018 1642   TSH 4.82 (H) 02/18/2018 1609  Results for AMANDO, ISHIKAWA (MRN [id number]) as of 10/23/2018 15:54  Ref. Range 10/01/2018 14:52  Vitamin D, 25-Hydroxy Latest Ref Range: 30 - 100 ng/mL 25 (L)      OBESITY BEHAVIORAL INTERVENTION VISIT  Today's visit was # 2   Starting weight: 273 lbs Starting date: 10/09/2018 Today's weight : Weight: 272 lb (123.4 kg)  Today's date: 10/23/2018 Total lbs lost to date: 1      10/23/2018  BP 114/75  Temp 97.8 F (36.6 C)  Pulse 65  SpO2 99 %  Weight 272 lb  Height 5' 7" (1.702 m)  BMI (Calculated) 42.59   ASK: We discussed the diagnosis of obesity with Jill Harper today and Jill Harper agreed to give [name] permission to discuss obesity behavioral modification therapy today.  ASSESS: Jill Harper has the diagnosis of obesity and her BMI today is 42.59 Jill Harper is in the action stage of change   ADVISE: Jill Harper was educated on the multiple health risks of obesity as well as the benefit of weight loss to improve her health. She was advised of the need for long term treatment and the importance of lifestyle modifications to improve her current health and to decrease  her risk of future health problems.  AGREE: Multiple dietary modification options and treatment options were discussed and  Jill Harper agreed to follow the recommendations documented in the above note.  ARRANGE: Jill Harper was educated on the importance of frequent visits to treat obesity as outlined per CMS and USPSTF guidelines and agreed to schedule her next follow up appointment today.  I, Tammy Wysor, am acting as Location manager for Dennard Nip, MD  I have reviewed the above documentation for accuracy and completeness, and I agree with the above. -Dennard Nip, MD

## 2018-10-27 ENCOUNTER — Ambulatory Visit: Payer: Federal, State, Local not specified - PPO | Admitting: Pulmonary Disease

## 2018-10-27 ENCOUNTER — Telehealth (HOSPITAL_COMMUNITY): Payer: Self-pay

## 2018-10-27 DIAGNOSIS — J471 Bronchiectasis with (acute) exacerbation: Secondary | ICD-10-CM | POA: Diagnosis not present

## 2018-10-27 DIAGNOSIS — G4733 Obstructive sleep apnea (adult) (pediatric): Secondary | ICD-10-CM | POA: Diagnosis not present

## 2018-10-28 ENCOUNTER — Encounter (INDEPENDENT_AMBULATORY_CARE_PROVIDER_SITE_OTHER): Payer: Self-pay

## 2018-10-28 ENCOUNTER — Ambulatory Visit (HOSPITAL_COMMUNITY): Payer: Federal, State, Local not specified - PPO

## 2018-10-28 NOTE — Progress Notes (Signed)
Pulmonary Individual Treatment Plan  Patient Details  Name: Jill Harper MRN: [id number] Date of Birth: [birth year] Referring Provider:    Initial Encounter Date:   Visit Diagnosis: ILD (interstitial lung disease) ([place])  Patient's Home Medications on Admission:   Current Outpatient Medications:  .  albuterol (PROVENTIL HFA) 108 (90 Base) MCG/ACT inhaler, Inhale 2 puffs into the lungs 4 (four) times daily as needed. Reported on 09/03/2015, Disp: 1 Inhaler, Rfl: 4 .  albuterol (PROVENTIL) (2.5 MG/3ML) 0.083% nebulizer solution, Take 3 mLs (2.5 mg total) by nebulization every 6 (six) hours as needed for wheezing or shortness of breath., Disp: 120 vial, Rfl: 5 .  aspirin EC 81 MG tablet, Take 81 mg by mouth daily., Disp: , Rfl:  .  atenolol (TENORMIN) 25 MG tablet, TAKE 1/2 TABLET BY MOUTH DAILY AS NEEDED FOR SYMPTOMS OF MITRAL PROLAPSE (Patient taking differently: Take 12.5 mg by mouth daily as needed (mitral prolapse). ), Disp: 45 tablet, Rfl: 1 .  azaTHIOprine (IMURAN) 50 MG tablet, TAKE 2 TABLETS(100 MG) BY MOUTH DAILY (Patient taking differently: Take 50 mg by mouth daily. ), Disp: 60 tablet, Rfl: 2 .  eletriptan (RELPAX) 20 MG tablet, One tablet by mouth at onset of headache. May repeat in 2 hours if headache persists or recurs., Disp: 10 tablet, Rfl: 0 .  fluticasone furoate-vilanterol (BREO ELLIPTA) 100-25 MCG/INH AEPB, Inhale 1 puff into the lungs daily., Disp: 60 each, Rfl: 5 .  folic acid (FOLVITE) 1 MG tablet, Take 2 tablets (2 mg total) by mouth daily., Disp: 180 tablet, Rfl: 3 .  guaiFENesin (MUCINEX) 600 MG 12 hr tablet, Take 2 tablets (1,200 mg total) by mouth 2 (two) times daily as needed for cough or to loosen phlegm. (Patient taking differently: Take 1,200 mg by mouth 2 (two) times daily. ), Disp: , Rfl:  .  Multiple Vitamins-Minerals (MULTIVITAMIN GUMMIES ADULT PO), Take 2 each by mouth daily., Disp: , Rfl:  .  Respiratory Therapy Supplies (FLUTTER) DEVI, Use as directed (Patient  taking differently: 2 (two) times daily. Use as directed), Disp: 1 each, Rfl: 0 .  metFORMIN (GLUCOPHAGE) 500 MG tablet, Take 1 tablet (500 mg total) by mouth daily with breakfast., Disp: 30 tablet, Rfl: 0 .  telmisartan (MICARDIS) 20 MG tablet, Take 1 tablet (20 mg total) by mouth daily., Disp: 30 tablet, Rfl: 5  Past Medical History: Past Medical History:  Diagnosis Date  . Abnormal liver function test   . Bronchiectasis       . Dyspnea   . Fibromuscular dysplasia ([place])   . Fibromyalgia   . Heart murmur   . ILD (interstitial lung disease) ([place])   . Joint pain   . Menorrhagia   . MIGRAINE HEADACHE   . MVP (mitral valve prolapse)   . Osteoarthritis   . Polymyositis ([place])    Dr [name]; chronic MTX  . Pulmonary fibrosis ([place])   . Rheumatoid arthritis ([place])   . Sleep apnea   . Vitamin B 12 deficiency   . Vitamin D deficiency     Tobacco Use: Social History   Tobacco Use  Smoking Status Never Smoker  Smokeless Tobacco Never Used  Tobacco Comment   Married, lives with spouse. 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    Labs: Recent [employer] for ITP Cardiac and Pulmonary Rehab Latest Ref Rng & Units 07/08/2009 06/14/2011 08/28/2012 04/24/2016 10/09/2018   Cholestrol 100 - 199 mg/dL 158  133 141 110 141   LDLCALC 0 - 99 mg/dL 93 60 74 59 74   HDL >39 mg/dL 58.00 65.80 57.10 39.20 44   Trlycerides 0 - 149 mg/dL 37.0 34.0 51.0 59.0 116   Hemoglobin A1c 4.8 - 5.6 % - - - 4.8 5.5      Capillary Blood Glucose: No results found for: GLUCAP   Pulmonary Assessment Scores: Pulmonary Assessment Scores    Row Name 10/13/18 1045 10/13/18 1207 10/16/18 1012     ADL UCSD   ADL Phase  Entry  -  Entry   SOB Score total  66  62  -     CAT Score   CAT Score  28  -  -     mMRC Score   mMRC Score  -  -  3      Pulmonary Function Assessment:   Exercise Target Goals: Exercise Program Goal: Individual exercise  prescription set using results from initial 6 min walk test and THRR while considering  patient's activity barriers and safety.   Exercise Prescription Goal: Initial exercise prescription builds to 30-45 minutes a day of aerobic activity, 2-3 days per week.  Home exercise guidelines will be given to patient during program as part of exercise prescription that the participant will acknowledge.  Activity Barriers & Risk Stratification: Activity Barriers & Cardiac Risk Stratification - 10/13/18 1034      Activity Barriers & Cardiac Risk Stratification   Activity Barriers  Arthritis;Shortness of Breath;Fibromyalgia   RA   Cardiac Risk Stratification  Low       6 Minute Walk: 6 Minute Walk    Row Name 10/16/18 0738 10/16/18 0910       6 Minute Walk   Phase  Initial  (Pended)   Initial    Distance  1400 feet  (Pended)   1400 feet    Walk Time  6 minutes  (Pended)   6 minutes    # of Rest Breaks  0  (Pended)   0    MPH  2.65  (Pended)   2.65    METS  3.07  (Pended)   3.07    RPE  14  (Pended)   14    Perceived Dyspnea   3  (Pended)   3    Symptoms  -  No    Resting HR  -  91 bpm    Resting BP  -  122/80    Resting Oxygen Saturation   -  97 %    Exercise Oxygen Saturation  during 6 min walk  -  96 %    Max Ex. HR  -  119 bpm    Max Ex. BP  -  150/80      Interval HR   1 Minute HR  -  102    2 Minute HR  -  114    3 Minute HR  -  114    4 Minute HR  -  115    5 Minute HR  -  119    6 Minute HR  -  114    Interval Heart Rate?  -  Yes      Interval Oxygen   Interval Oxygen?  -  Yes    Baseline Oxygen Saturation %  -  97 %    1 Minute Oxygen Saturation %  -  99 %    1 Minute Liters of Oxygen  -  0 L  2 Minute Oxygen Saturation %  -  99 %    2 Minute Liters of Oxygen  -  0 L    3 Minute Oxygen Saturation %  -  98 %    3 Minute Liters of Oxygen  -  0 L    4 Minute Oxygen Saturation %  -  96 %    4 Minute Liters of Oxygen  -  0 L    5 Minute Oxygen Saturation %  -  98 %     5 Minute Liters of Oxygen  -  0 L    6 Minute Oxygen Saturation %  -  98 %    6 Minute Liters of Oxygen  -  0 L       Oxygen Initial Assessment: Oxygen Initial Assessment - 10/16/18 1011      Initial 6 min Walk   Oxygen Used  None      Program Oxygen Prescription   Program Oxygen Prescription  None       Oxygen Re-Evaluation:   Oxygen Discharge (Final Oxygen Re-Evaluation):   Initial Exercise Prescription: Initial Exercise Prescription - 10/16/18 1000      Date of Initial Exercise RX and Referring Provider   Date  10/14/18    Referring Provider  Dr. Valeta Harms      Recumbant Bike   Level  2    Watts  10    Minutes  17      NuStep   Level  2    SPM  80    Minutes  17    METs  1.5      Track   Laps  10    Minutes  17      Prescription Details   Frequency (times per week)  2    Duration  Progress to 45 minutes of aerobic exercise without signs/symptoms of physical distress      Intensity   THRR 40-80% of Max Heartrate  60-121    Ratings of Perceived Exertion  11-13    Perceived Dyspnea  0-4      Progression   Progression  Continue to progress workloads to maintain intensity without signs/symptoms of physical distress.      Resistance Training   Training Prescription  Yes    Weight  blue bands    Reps  10-15       Perform Capillary Blood Glucose checks as needed.  Exercise Prescription Changes:   Exercise Comments:   Exercise Goals and Review: Exercise Goals    Row Name 10/13/18 1037             Exercise Goals   Increase Physical Activity  Yes       Intervention  Provide advice, education, support and counseling about physical activity/exercise needs.;Develop an individualized exercise prescription for aerobic and resistive training based on initial evaluation findings, risk stratification, comorbidities and participant's personal goals.       Expected Outcomes  Short Term: Attend rehab on a regular basis to increase amount of physical  activity.;Long Term: Add in home exercise to make exercise part of routine and to increase amount of physical activity.;Long Term: Exercising regularly at least 3-5 days a week.       Increase Strength and Stamina  Yes       Intervention  Provide advice, education, support and counseling about physical activity/exercise needs.;Develop an individualized exercise prescription for aerobic and resistive training based on initial evaluation findings, risk stratification, comorbidities  and participant's personal goals.       Expected Outcomes  Short Term: Increase workloads from initial exercise prescription for resistance, speed, and METs.;Short Term: Perform resistance training exercises routinely during rehab and add in resistance training at home;Long Term: Improve cardiorespiratory fitness, muscular endurance and strength as measured by increased METs and functional capacity (6MWT)       Able to understand and use rate of perceived exertion (RPE) scale  Yes       Intervention  Provide education and explanation on how to use RPE scale       Expected Outcomes  Short Term: Able to use RPE daily in rehab to express subjective intensity level;Long Term:  Able to use RPE to guide intensity level when exercising independently       Able to understand and use Dyspnea scale  Yes       Intervention  Provide education and explanation on how to use Dyspnea scale       Expected Outcomes  Short Term: Able to use Dyspnea scale daily in rehab to express subjective sense of shortness of breath during exertion;Long Term: Able to use Dyspnea scale to guide intensity level when exercising independently       Knowledge and understanding of Target Heart Rate Range (THRR)  Yes       Intervention  Provide education and explanation of THRR including how the numbers were predicted and where they are located for reference       Expected Outcomes  Short Term: Able to state/look up THRR;Short Term: Able to use daily as guideline for  intensity in rehab;Long Term: Able to use THRR to govern intensity when exercising independently       Understanding of Exercise Prescription  Yes       Intervention  Provide education, explanation, and written materials on patient's individual exercise prescription       Expected Outcomes  Short Term: Able to explain program exercise prescription;Long Term: Able to explain home exercise prescription to exercise independently          Exercise Goals Re-Evaluation :   Discharge Exercise Prescription (Final Exercise Prescription Changes):   Nutrition:  Target Goals: Understanding of nutrition guidelines, daily intake of sodium 1500mg , cholesterol 200mg , calories 30% from fat and 7% or less from saturated fats, daily to have 5 or more servings of fruits and vegetables.  Biometrics:    Nutrition Therapy Plan and Nutrition Goals:   Nutrition Assessments:   Nutrition Goals Re-Evaluation:   Nutrition Goals Discharge (Final Nutrition Goals Re-Evaluation):   Psychosocial: Target Goals: Acknowledge presence or absence of significant depression and/or stress, maximize coping skills, provide positive support system. Participant is able to verbalize types and ability to use techniques and skills needed for reducing stress and depression.  Initial Review & Psychosocial Screening: Initial Psych Review & Screening - 10/13/18 1210      Family Dynamics   Comments  Some stress with work at the [employer].  Kathi talks with customers who are often disgruntle and the conversation can become tense.       Quality of Life Scores:  Scores of 19 and below usually indicate a poorer quality of life in these areas.  A difference of  2-3 points is a clinically meaningful difference.  A difference of 2-3 points in the total score of the Quality of Life Index has been associated with significant improvement in overall quality of life, self-image, physical symptoms, and general health in studies  assessing  change in quality of life.  PHQ-9: Recent Review Flowsheet Data    Depression screen [facility] 2/9 10/13/2018 10/09/2018 04/05/2017   Decreased Interest 0 3 0   Down, Depressed, Hopeless 0 3 0   PHQ - 2 Score 0 6 0   Altered sleeping 3  3 -   Tired, decreased energy 3 3 -   Change in appetite 1 1 -   Feeling bad or failure about yourself  0 3 -   Trouble concentrating 0 0 -   Moving slowly or fidgety/restless 3 3 -   Suicidal thoughts 0 0 -   PHQ-9 Score 10 19 -   Difficult doing work/chores Somewhat difficult Not difficult at all -     Interpretation of Total Score  Total Score Depression Severity:  1-4 = Minimal depression, 5-9 = Mild depression, 10-14 = Moderate depression, 15-19 = Moderately severe depression, 20-27 = Severe depression   Psychosocial Evaluation and Intervention: Psychosocial Evaluation - 10/28/18 1200      Psychosocial Evaluation & Interventions   Interventions  Stress management education;Relaxation education;Encouraged to exercise with the program and follow exercise prescription    Comments  Unable to start pulmonary rehab d/t COVID-19 precautions/closures, will hopefully reopen November 25, 2018.    Expected Outcomes  No barriers to participation in pulmonary rehab once program reopens.    Continue Psychosocial Services   No Follow up required       Psychosocial Re-Evaluation: Psychosocial Re-Evaluation    [place] Name 10/28/18 1202             Psychosocial Re-Evaluation   Current issues with  Current Sleep Concerns       Comments  Will benefit from relaxation/stress management classes when program reopens       Expected Outcomes  No barriers to participation in pulmonary rehab       Interventions  Relaxation education;Encouraged to attend Pulmonary Rehabilitation for the exercise;Stress management education       Continue Psychosocial Services   No Follow up required          Psychosocial Discharge (Final Psychosocial Re-Evaluation): Psychosocial  Re-Evaluation - 10/28/18 1202      Psychosocial Re-Evaluation   Current issues with  Current Sleep Concerns    Comments  Will benefit from relaxation/stress management classes when program reopens    Expected Outcomes  No barriers to participation in pulmonary rehab    Interventions  Relaxation education;Encouraged to attend Pulmonary Rehabilitation for the exercise;Stress management education    Continue Psychosocial Services   No Follow up required       Education: Education Goals: Education classes will be provided on a weekly basis, covering required topics. Participant will state understanding/return demonstration of topics presented.  Learning Barriers/Preferences: Learning Barriers/Preferences - 10/13/18 1058      Learning Barriers/Preferences   Learning Barriers  (S) Sight   readers and contact lens   Learning Preferences  Written Material;Group Instruction;Individual Instruction       Education Topics: Risk Factor Reduction:  -Group instruction that is supported by a PowerPoint presentation. Instructor discusses the definition of a risk factor, different risk factors for pulmonary disease, and how the heart and lungs work together.     Nutrition for Pulmonary Patient:  -Group instruction provided by PowerPoint slides, verbal discussion, and written materials to support subject matter. The instructor gives an explanation and review of healthy diet recommendations, which includes a discussion on weight management, recommendations for fruit and vegetable consumption, as well as  protein, fluid, caffeine, fiber, sodium, sugar, and alcohol. Tips for eating when patients are short of breath are discussed.   Pursed Lip Breathing:  -Group instruction that is supported by demonstration and informational handouts. Instructor discusses the benefits of pursed lip and diaphragmatic breathing and detailed demonstration on how to preform both.     Oxygen Safety:  -Group instruction  provided by PowerPoint, verbal discussion, and written material to support subject matter. There is an overview of "What is Oxygen" and "Why do we need it".  Instructor also reviews how to create a safe environment for oxygen use, the importance of using oxygen as prescribed, and the risks of noncompliance. There is a brief discussion on traveling with oxygen and resources the patient may utilize.   Oxygen Equipment:  -Group instruction provided by [facility] Staff utilizing handouts, written materials, and equipment demonstrations.   Signs and Symptoms:  -Group instruction provided by written material and verbal discussion to support subject matter. Warning signs and symptoms of infection, stroke, and heart attack are reviewed and when to call the physician/911 reinforced. Tips for preventing the spread of infection discussed.   Advanced Directives:  -Group instruction provided by verbal instruction and written material to support subject matter. Instructor reviews Advanced Directive laws and proper instruction for filling out document.   Pulmonary Video:  -Group video education that reviews the importance of medication and oxygen compliance, exercise, good nutrition, pulmonary hygiene, and pursed lip and diaphragmatic breathing for the pulmonary patient.   Exercise for the Pulmonary Patient:  -Group instruction that is supported by a PowerPoint presentation. Instructor discusses benefits of exercise, core components of exercise, frequency, duration, and intensity of an exercise routine, importance of utilizing pulse oximetry during exercise, safety while exercising, and options of places to exercise outside of rehab.     Pulmonary Medications:  -Verbally interactive group education provided by instructor with focus on inhaled medications and proper administration.   Anatomy and Physiology of the Respiratory System and Intimacy:  -Group instruction provided by PowerPoint, verbal  discussion, and written material to support subject matter. Instructor reviews respiratory cycle and anatomical components of the respiratory system and their functions. Instructor also reviews differences in obstructive and restrictive respiratory diseases with examples of each. Intimacy, Sex, and Sexuality differences are reviewed with a discussion on how relationships can change when diagnosed with pulmonary disease. Common sexual concerns are reviewed.   MD DAY -A group question and answer session with a medical doctor that allows participants to ask questions that relate to their pulmonary disease state.   OTHER EDUCATION -Group or individual verbal, written, or video instructions that support the educational goals of the pulmonary rehab program.   Holiday Eating Survival Tips:  -Group instruction provided by PowerPoint slides, verbal discussion, and written materials to support subject matter. The instructor gives patients tips, tricks, and techniques to help them not only survive but enjoy the holidays despite the onslaught of food that accompanies the holidays.   Knowledge Questionnaire Score: Knowledge Questionnaire Score - 10/13/18 1102      Knowledge Questionnaire Score   Pre Score  15.5       Core Components/Risk Factors/Patient Goals at Admission: Personal Goals and Risk Factors at Admission - 10/13/18 1109      Core Components/Risk Factors/Patient Goals on Admission    Weight Management  Yes;Weight Loss    Admit Weight  280 lb 10.3 oz (127.3 kg)    Goal Weight: Short Term  270 lb (122.5 kg)  Goal Weight: Long Term  260 lb (117.9 kg)    Expected Outcomes  Understanding of distribution of calorie intake throughout the day with the consumption of 4-5 meals/snacks;Understanding recommendations for meals to include 15-35% energy as protein, 25-35% energy from fat, 35-60% energy from carbohydrates, less than 200mg  of dietary cholesterol, 20-35 gm of total fiber daily;Weight  Loss: Understanding of general recommendations for a balanced deficit meal plan, which promotes 1-2 lb weight loss per week and includes a negative energy balance of [phone] kcal/d;Long Term: Adherence to nutrition and physical activity/exercise program aimed toward attainment of established weight goal;Short Term: Continue to assess and modify interventions until short term weight is achieved    Improve shortness of breath with ADL's  Yes    Intervention  Provide education, individualized exercise plan and daily activity instruction to help decrease symptoms of SOB with activities of daily living.    Expected Outcomes  Short Term: Improve cardiorespiratory fitness to achieve a reduction of symptoms when performing ADLs;Long Term: Be able to perform more ADLs without symptoms or delay the onset of symptoms       Core Components/Risk Factors/Patient Goals Review:  Goals and Risk Factor Review    Row Name 10/28/18 1205             Core Components/Risk Factors/Patient Goals Review   Personal Goals Review  Weight Management/Obesity;Improve shortness of breath with ADL's;Increase knowledge of respiratory medications and ability to use respiratory devices properly.;Develop more efficient breathing techniques such as purse lipped breathing and diaphragmatic breathing and practicing self-pacing with activity.;Stress;Hypertension       Review  Has not started pulmonary rehab yet d/t program closure d/t COVID-19 precautions, will hopefully reopen November 25, 2018.       Expected Outcomes  See admission goals          Core Components/Risk Factors/Patient Goals at Discharge (Final Review):  Goals and Risk Factor Review - 10/28/18 1205      Core Components/Risk Factors/Patient Goals Review   Personal Goals Review  Weight Management/Obesity;Improve shortness of breath with ADL's;Increase knowledge of respiratory medications and ability to use respiratory devices properly.;Develop more efficient breathing  techniques such as purse lipped breathing and diaphragmatic breathing and practicing self-pacing with activity.;Stress;Hypertension    Review  Has not started pulmonary rehab yet d/t program closure d/t COVID-19 precautions, will hopefully reopen November 25, 2018.    Expected Outcomes  See admission goals       ITP Comments: ITP Comments    Row Name 10/13/18 1032           ITP Comments  Dr. Manfred Arch, Medical Director Pulmonary Rehab          Comments: ITP REVIEW Pt is making expected progress toward pulmonary rehab goals after completing 0 sessions. Recommend continued exercise, life style modification, education, and utilization of breathing techniques to increase stamina and strength and decrease shortness of breath with exertion.

## 2018-10-29 ENCOUNTER — Telehealth (INDEPENDENT_AMBULATORY_CARE_PROVIDER_SITE_OTHER): Payer: Federal, State, Local not specified - PPO | Admitting: Acute Care

## 2018-10-29 ENCOUNTER — Ambulatory Visit: Payer: Federal, State, Local not specified - PPO | Admitting: Pulmonary Disease

## 2018-10-29 ENCOUNTER — Other Ambulatory Visit: Payer: Self-pay

## 2018-10-29 DIAGNOSIS — G4733 Obstructive sleep apnea (adult) (pediatric): Secondary | ICD-10-CM

## 2018-10-29 DIAGNOSIS — Z9989 Dependence on other enabling machines and devices: Secondary | ICD-10-CM | POA: Diagnosis not present

## 2018-10-29 DIAGNOSIS — J479 Bronchiectasis, uncomplicated: Secondary | ICD-10-CM

## 2018-10-29 NOTE — Patient Instructions (Addendum)
We will schedule an Overnight oximetry with CPAP in place We will call you with the results If you continue to have desaturations on CPAP, we will prescribe oxygen at that time.  Continue on CPAP at bedtime. You appear to be benefiting from the treatment Goal is to wear for at least 6 hours each night for maximal clinical benefit. Continue to work on weight loss, as the link between excess weight  and sleep apnea is well established.  Do not drive if sleepy. Remember to clean mask, tubing, filter, and reservoir once weekly with soapy water.  Follow up with Sarah NP 01/19/2019 at 4:30 pm   Bronchiectasis At baseline Flutter valve 4 puffs 4 times a day. Mucinex 1200 mg twice daily. ( With full glass of water). Continue Breo daily Rinse mouth after use Continue Proventil as needed for breakthrough shortness of breath  Call the office for any new blood in your sputum Please contact office for sooner follow up if symptoms do not improve or worsen or seek emergency care    Follow Up Instructions:  Follow up appointment 01/19/2019 at 4:30 pm

## 2018-10-29 NOTE — Progress Notes (Signed)
Virtual Visit via Telephone Note  I connected with Jill Harper on 10/29/2018 at  4:30 PM EDT by telephone and verified that I am speaking with the correct person using two identifiers.   I discussed the limitations, risks, security and privacy concerns of performing an evaluation and management service by telephone and the availability of in person appointments. I also discussed with the patient that there may be a patient responsible charge related to this service. The patient expressed understanding and agreed to proceed.  The patient was at home for this visit, and I was in the office .  We confirmed correct person/ identity  with identification of date of birth and address.  History of Present Illness: HPI 53 year old female followed by [name]  for bronchiectasis in the setting of rheumatoid arthritis and polymyositis She was last seen 10/08/2018 for increased cough and sputum production. Sputum had been darker than her baseline sputum, and blood tinged at times. She had recent flu exposure and URI recently. CXR and Viral panel were obtained. She was treated with Doxycycline, which she completed. She has had no further issues .She states she is at her baseline today with her breathing. She states she has had changed with her cough. Sputum has been discolored. A dark yellow to light [place] yesterday, but has cleared this morning. She states she feels she is at her baseline.No hemoptysis.   Pt. Called today for follow up. She wanted to discuss her CPAP.   She is doing well on the CPAP. Down Load is excellent. AHI is 1.0. She states she is still waking up gasping like she can't breath. She noticed when she did her sleep study that she woke up when her saturations dropped. She feels she may need oxygen in addition to her CPAP device.   She states she does still have headache when she awakens, but suspects that this may be related to her hypertension.She has recently  been started on BP maintenance  by her PCP Dr. Quay Burow.  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 This was  confirmed by cerbroangiogram on . She has been started on Daily ASA for this with follow up CT in 6 months.   Significant Tests  CPAP Down Load 2/24-3/24/2020 AirSense 10 Auto Set Usage Days>> 30/30 or 100% > 4 hours 30 days Average use>> 5 hours 30 minutes Auto Set 5-15 cm H2O Median pressure 7.2 Minimal leaks AHI 1.0  CXR 10/08/2018 Stable coarse reticular opacities greatest in the lung bases compatible pulmonary fibrosis. Stable normal cardiac silhouette. No consolidation, effusion, or pneumothorax. Bones are unremarkable. No Acute pulmonary process.  RVP 10/08/2018  Influenza A RNA Not Detected Not Detected   Influenza B RNA Not Detected Not Detected   RSV RNA Not Detected Not Detected   hMPV Not Detected Not Detected      09/18/2018 IR ANGIO VERTEBRAL SEL VERTEBRAL BILAT MOD SED; BILATERAL COMMON CAROTID AND INNOMINATE ANGIOGRAPHY; IR ULTRASOUND GUIDANCE VASC ACCESS RIGHT IMPRESSION: Moderate FMD changes involving the mid cervical segment of the left internal carotid artery with no significant stenosis.  Moderate to severe fibromuscular dysplastic change involving the right internal carotid artery distal to the bulb associated with suggestion of a high-grade stenosis on the AP projection at the proximal aspect of the FMD-like changes.  Transient retrograde opacification of the right internal carotid artery supraclinoid segment via the right posterior communicating artery.  07/2018 Home Sleep Study Mild OSA with AHI of 5  Observations/Objective:  OSA Despite good compliance with CPAP continues to have periods of awakening with gasping for air. Did have desaturations with Home Sleep Study to 83%( 71 desaturations, Low of 83%, Average of 93%)  Baseline Bronchiectasis >> completed her Doxycycline 10/08/2018  Assessment and  Plan: OSA Despite good compliance with CPAP continues to have periods of awakening with gasping for air. Did have desaturations with Home Sleep Study to 83%( 71 desaturations, Low of 83%, Average of 93%) Plan: Overnight oximetry with CPAP in place We will call you with the results If you continue to have desaturations on CPAP, we will prescribe oxygen at that time.  Continue on CPAP at bedtime. You appear to be benefiting from the treatment Goal is to wear for at least 6 hours each night for maximal clinical benefit. Continue to work on weight loss, as the link between excess weight  and sleep apnea is well established.  Do not drive if sleepy. Remember to clean mask, tubing, filter, and reservoir once weekly with soapy water.  Follow up with Sarah NP 01/19/2019 at 4:30 pm   Bronchiectasis Flutter valve 4 puffs 4 times a day. Mucinex 1200 mg twice daily. ( With full glass of water). Continue Breo daily Rinse mouth after use Continue Proventil as needed for breakthrough shortness of breath  Call the office for any new blood in your sputum Please contact office for sooner follow up if symptoms do not improve or worsen or seek emergency care    Follow Up Instructions:  Follow up appointment 01/19/2019 at 4:30 pm   I discussed the assessment and treatment plan with the patient. The patient was provided an opportunity to ask questions and all were answered. The patient agreed with the plan and demonstrated an understanding of the instructions.   The patient was advised to call back or seek an in-person evaluation if the symptoms worsen or if the condition fails to improve as anticipated.  I provided 30 minutes of non-face-to-face time during this encounter.  Medications and Problem list were reviewed.   [name], NP 10/29/2018 5:12 PM

## 2018-10-30 ENCOUNTER — Ambulatory Visit (HOSPITAL_COMMUNITY): Payer: Federal, State, Local not specified - PPO

## 2018-11-04 ENCOUNTER — Ambulatory Visit (HOSPITAL_COMMUNITY): Payer: Federal, State, Local not specified - PPO

## 2018-11-05 ENCOUNTER — Encounter: Payer: Self-pay | Admitting: Acute Care

## 2018-11-05 DIAGNOSIS — J471 Bronchiectasis with (acute) exacerbation: Secondary | ICD-10-CM | POA: Diagnosis not present

## 2018-11-05 DIAGNOSIS — G4733 Obstructive sleep apnea (adult) (pediatric): Secondary | ICD-10-CM | POA: Diagnosis not present

## 2018-11-06 ENCOUNTER — Ambulatory Visit (HOSPITAL_COMMUNITY): Payer: Federal, State, Local not specified - PPO

## 2018-11-10 ENCOUNTER — Other Ambulatory Visit: Payer: Self-pay

## 2018-11-10 ENCOUNTER — Encounter (INDEPENDENT_AMBULATORY_CARE_PROVIDER_SITE_OTHER): Payer: Self-pay | Admitting: Family Medicine

## 2018-11-10 ENCOUNTER — Ambulatory Visit (INDEPENDENT_AMBULATORY_CARE_PROVIDER_SITE_OTHER): Payer: Federal, State, Local not specified - PPO | Admitting: Family Medicine

## 2018-11-10 DIAGNOSIS — Z6841 Body Mass Index (BMI) 40.0 and over, adult: Secondary | ICD-10-CM

## 2018-11-10 DIAGNOSIS — E8881 Metabolic syndrome: Secondary | ICD-10-CM | POA: Diagnosis not present

## 2018-11-10 DIAGNOSIS — I10 Essential (primary) hypertension: Secondary | ICD-10-CM

## 2018-11-10 MED ORDER — METFORMIN HCL 500 MG PO TABS: 500.0000 mg | ORAL_TABLET | Freq: Two times a day (BID) | ORAL | 0 refills | Status: DC

## 2018-11-11 ENCOUNTER — Ambulatory Visit (HOSPITAL_COMMUNITY): Payer: Federal, State, Local not specified - PPO

## 2018-11-11 NOTE — Progress Notes (Signed)
Office: [phone]  /  Fax: [phone] TeleHealth Visit:  Jill Harper has verbally consented to this TeleHealth visit today. The patient is located at work, the provider is located at the [employer] [facility] office. The participants in this visit include the listed provider and patient. The visit was conducted today via Webex.  HPI:   Chief Complaint: OBESITY Jill Harper is here to discuss her progress with her obesity treatment plan. She is on the Category 2 plan and is following her eating plan approximately 90 % of the time. She states she is exercising 0 minutes 0 times per week. Jill Harper feels that she has done well following her Category 2 plan. She hasn't weighed, but she is wearing some clothes that used to be too small. She notes increased work temptations, but has done well avoiding office snacks.  We were unable to weigh the patient today for this TeleHealth visit. She feels as if she has lost inches, but she has not weighed since her last visit. She has lost 1 lb since starting treatment with [name].  Insulin Resistance Jill Harper has a diagnosis of insulin resistance based on her elevated fasting insulin level >5. Although Ashunti's blood glucose readings are still under good control, insulin resistance puts her at greater risk of metabolic syndrome and diabetes. She started metformin currently and is still struggling with some polyphagia. Tammala has done well avoiding temptations. She continues to work on diet and exercise to decrease risk of diabetes. Jill Harper denies nausea, vomiting, and muscle weakness.  Hypertension Jill Harper is a 53 y.o. female with hypertension. Jill Harper's blood pressure was controlled at her last visit. She is stable on medications and doing well with weight loss. She has not checked her blood pressure at home. She is working on weight loss to help control her blood pressure with the goal of decreasing her risk of heart attack and stroke. Kinjal denies chest pain or headache.   ASSESSMENT AND PLAN:  Essential hypertension  Insulin resistance - Plan: metFORMIN (GLUCOPHAGE) 500 MG tablet  Class 3 severe obesity with serious comorbidity and body mass index (BMI) of 40.0 to 44.9 in adult, unspecified obesity type ([place])  PLAN:  Insulin Resistance Jill Harper will continue to work on weight loss, exercise, and decreasing simple carbohydrates in her diet to help decrease the risk of diabetes. She was informed that eating too many simple carbohydrates or too many calories at one sitting increases the likelihood of GI side effects. Dorrine agreed to increase metformin 500 mg BID with a meal #60 with no refills and prescription was written today. Jill Harper agreed to follow up with [name] as directed to monitor her progress in 2 weeks.  Hypertension We discussed sodium restriction, working on healthy weight loss, and a regular exercise program as the means to achieve improved blood pressure control. We will continue to monitor her blood pressure as well as her progress with the above lifestyle modifications. She will continue her medications as prescribed and will watch for signs of hypotension as she continues her lifestyle modifications. Jill Harper will check her blood pressure at home and we will review at her next visit. Jill Harper agreed with this plan and agreed to follow up as directed.  Obesity Jill Harper is currently in the action stage of change. As such, her goal is to continue with weight loss efforts. She has agreed to follow the Category 2 plan. Lastacia has been instructed to work up to a goal of 150 minutes of combined cardio and  strengthening exercise per week for weight loss and overall health benefits. We discussed the following Behavioral Modification Strategies today: dealing with family or coworker sabotage, better snacking choices, and ways to avoid boredom eating.  Morning has agreed to follow up with our clinic in 2 weeks. She was informed of the importance of frequent follow up visits to  maximize her success with intensive lifestyle modifications for her multiple health conditions.  ALLERGIES: Allergies  Allergen Reactions  . Sulfonamide Derivatives     REACTION: hives    MEDICATIONS: Current Outpatient Medications on File Prior to Visit  Medication Sig Dispense Refill  . albuterol (PROVENTIL HFA) 108 (90 Base) MCG/ACT inhaler Inhale 2 puffs into the lungs 4 (four) times daily as needed. Reported on 09/03/2015 1 Inhaler 4  . albuterol (PROVENTIL) (2.5 MG/3ML) 0.083% nebulizer solution Take 3 mLs (2.5 mg total) by nebulization every 6 (six) hours as needed for wheezing or shortness of breath. 120 vial 5  . aspirin EC 81 MG tablet Take 81 mg by mouth daily.    [name] atenolol (TENORMIN) 25 MG tablet TAKE 1/2 TABLET BY MOUTH DAILY AS NEEDED FOR SYMPTOMS OF MITRAL PROLAPSE (Patient taking differently: Take 12.5 mg by mouth daily as needed (mitral prolapse). ) 45 tablet 1  . azaTHIOprine (IMURAN) 50 MG tablet TAKE 2 TABLETS(100 MG) BY MOUTH DAILY (Patient taking differently: Take 50 mg by mouth daily. ) 60 tablet 2  . eletriptan (RELPAX) 20 MG tablet One tablet by mouth at onset of headache. May repeat in 2 hours if headache persists or recurs. 10 tablet 0  . fluticasone furoate-vilanterol (BREO ELLIPTA) 100-25 MCG/INH AEPB Inhale 1 puff into the lungs daily. 60 each 5  . folic acid (FOLVITE) 1 MG tablet Take 2 tablets (2 mg total) by mouth daily. 180 tablet 3  . guaiFENesin (MUCINEX) 600 MG 12 hr tablet Take 2 tablets (1,200 mg total) by mouth 2 (two) times daily as needed for cough or to loosen phlegm. (Patient taking differently: Take 1,200 mg by mouth 2 (two) times daily. )    . Multiple Vitamins-Minerals (MULTIVITAMIN GUMMIES ADULT PO) Take 2 each by mouth daily.    [name] Respiratory Therapy Supplies (FLUTTER) DEVI Use as directed (Patient taking differently: 2 (two) times daily. Use as directed) 1 each 0  . telmisartan (MICARDIS) 20 MG tablet Take 1 tablet (20 mg total) by mouth daily.  30 tablet 5   No current facility-administered medications on file prior to visit.     PAST MEDICAL HISTORY: Past Medical History:  Diagnosis Date  . Abnormal liver function test   . Bronchiectasis       . Dyspnea   . Fibromuscular dysplasia ([place])   . Fibromyalgia   . Heart murmur   . ILD (interstitial lung disease) ([place])   . Joint pain   . Menorrhagia   . MIGRAINE HEADACHE   . MVP (mitral valve prolapse)   . Osteoarthritis   . Polymyositis ([place])    Dr [name]; chronic MTX  . Pulmonary fibrosis ([place])   . Rheumatoid arthritis ([place])   . Sleep apnea   . Vitamin B 12 deficiency   . Vitamin D deficiency     PAST SURGICAL HISTORY: Past Surgical History:  Procedure Laterality Date  . ablation uterine  2010  . CARDIAC CATHETERIZATION  2002   normal  . DILATION AND CURETTAGE OF UTERUS  08-31-08   Dr [name]  . IR ANGIO INTRA EXTRACRAN SEL COM CAROTID INNOMINATE BILAT  MOD SED  09/18/2018  . IR ANGIO VERTEBRAL SEL VERTEBRAL BILAT MOD SED  09/18/2018  . IR US GUIDE VASC ACCESS RIGHT  09/18/2018    SOCIAL HISTORY: Social History   Tobacco Use  . Smoking status: Never Smoker  . Smokeless tobacco: Never Used  . Tobacco comment: Married, lives with spouse. 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 Alcohol use: Never    Frequency: Never    Comment: maybe a wine cooler once every other year  . Drug use: Never    FAMILY HISTORY: Family History  Problem Relation Age of Onset  . Diabetes Mother   . Fibromyalgia Mother   . Ulcers Mother   . Heart failure Mother   . Thyroid disease Mother   . Obesity Mother   . Multiple myeloma Father   . Hypertension Father   . Stroke Father   . Lupus Sister   . Other Sister        abdominal adhesions resulting in bowel obstruction  . Migraines Sister   . Heart disease Brother        bypass surgery  . Rheum arthritis Sister   . Multiple myeloma Sister   .  Hypertension Sister   . Heart attack Sister   . Diabetes Sister   . Hypertension Sister   . Rheum arthritis Sister   . Diabetes Sister   . Diabetes Brother   . Headache Other        siblings with headaches but not diagnosed as migraines    ROS: Review of Systems  Cardiovascular: Negative for chest pain.  Gastrointestinal: Negative for nausea and vomiting.  Musculoskeletal:       Negative for muscle weakness.  Neurological: Negative for headaches.    PHYSICAL EXAM: Pt in no acute distress  RECENT LABS AND TESTS: BMET    Component Value Date/Time   NA 144 10/09/2018 1300   K 4.5 10/09/2018 1300   CL 106 10/09/2018 1300   CO2 17 (L) 10/09/2018 1300   GLUCOSE 101 (H) 10/09/2018 1300   GLUCOSE 84 09/22/2018 1407   BUN 7 10/09/2018 1300   CREATININE 0.82 10/09/2018 1300   CREATININE 0.74 09/22/2018 1407   CALCIUM 10.0 10/09/2018 1300   GFRNONAA 83 10/09/2018 1300   GFRNONAA 93 09/22/2018 1407   GFRAA 95 10/09/2018 1300   GFRAA 108 09/22/2018 1407   Lab Results  Component Value Date   HGBA1C 5.5 10/09/2018   HGBA1C 4.8 04/24/2016   Lab Results  Component Value Date   INSULIN 22.4 10/09/2018   CBC    Component Value Date/Time   WBC 7.9 09/22/2018 1407   RBC 5.18 (H) 09/22/2018 1407   HGB 14.5 09/22/2018 1407   HCT 44.0 09/22/2018 1407   PLT 313 09/22/2018 1407   MCV 84.9 09/22/2018 1407   MCH 28.0 09/22/2018 1407   MCHC 33.0 09/22/2018 1407   RDW 12.9 09/22/2018 1407   LYMPHSABS 1,509 09/22/2018 1407   MONOABS 666 01/31/2017 1006   EOSABS 119 09/22/2018 1407   BASOSABS 40 09/22/2018 1407   Iron/TIBC/Ferritin/ %Sat No results found for: IRON, TIBC, FERRITIN, IRONPCTSAT Lipid Panel     Component Value Date/Time   CHOL 141 10/09/2018 1300   TRIG 116 10/09/2018 1300   HDL 44 10/09/2018 1300   CHOLHDL 3 04/24/2016 0900   VLDL 11.8 04/24/2016 0900   LDLCALC 74 10/09/2018 1300   Hepatic Function Panel  Component Value Date/Time   PROT 7.8  10/09/2018 1300   ALBUMIN 4.6 10/09/2018 1300   AST 21 10/09/2018 1300   ALT 15 10/09/2018 1300   ALKPHOS 100 10/09/2018 1300   BILITOT 0.6 10/09/2018 1300   BILIDIR 0.1 04/03/2018 1457   IBILI 0.3 04/03/2018 1457      Component Value Date/Time   TSH 3.24 10/01/2018 1452   TSH 5.28 (H) 03/18/2018 1642   TSH 4.82 (H) 02/18/2018 1609    Results for TOSHUA, HONSINGER (MRN [id number]) as of 11/11/2018 07:31  Ref. Range 10/01/2018 14:52  Vitamin D, 25-Hydroxy Latest Ref Range: 30 - 100 ng/mL 25 (L)    I, Marcille Blanco, CMA, am acting as transcriptionist for Starlyn Skeans, MD I have reviewed the above documentation for accuracy and completeness, and I agree with the above. -Dennard Nip, MD

## 2018-11-13 ENCOUNTER — Ambulatory Visit (HOSPITAL_COMMUNITY): Payer: Federal, State, Local not specified - PPO

## 2018-11-18 ENCOUNTER — Other Ambulatory Visit: Payer: Self-pay

## 2018-11-18 ENCOUNTER — Encounter: Payer: Self-pay | Admitting: Adult Health

## 2018-11-18 ENCOUNTER — Ambulatory Visit (INDEPENDENT_AMBULATORY_CARE_PROVIDER_SITE_OTHER): Payer: Federal, State, Local not specified - PPO | Admitting: Adult Health

## 2018-11-18 ENCOUNTER — Ambulatory Visit (HOSPITAL_COMMUNITY): Payer: Federal, State, Local not specified - PPO

## 2018-11-18 ENCOUNTER — Telehealth: Payer: Self-pay | Admitting: Pulmonary Disease

## 2018-11-18 ENCOUNTER — Telehealth (HOSPITAL_COMMUNITY): Payer: Self-pay

## 2018-11-18 DIAGNOSIS — J471 Bronchiectasis with (acute) exacerbation: Secondary | ICD-10-CM | POA: Diagnosis not present

## 2018-11-18 MED ORDER — AMOXICILLIN-POT CLAVULANATE 875-125 MG PO TABS: 1.0000 | ORAL_TABLET | Freq: Two times a day (BID) | ORAL | 0 refills | Status: AC

## 2018-11-18 NOTE — Telephone Encounter (Signed)
Call returned to patient, she states her boss at work had at to go to another office in [place] last week which housed a positive Covid case. The cleaning crew came in and cleaned the office and her boss later went down to work. 30 people were removed from that office to quarantine. When her boss came back from helping at that office she was having throat irritation and a bit of increased fatigue. Once her boss returned her co-workers as well as her family noticed that her cough changed. States it was much deeper, irritating and her phlegm color went darker. She states she has bronchiectasis and her normal color is white to yellow and now it appears dark yellow to green. She denies chills has slight body aches and increased fatigue. I encouraged the patient to quarantine from others until she received a phone call from someone from our house. Practice frequent hand washing and if she develops any worsening symptoms to call back. Tele-visit made. Nothing further is needed at this time.

## 2018-11-18 NOTE — Progress Notes (Signed)
Virtual Visit via Telephone Note  I connected with Jill Harper on 11/18/18 at  2:00 PM EDT by telephone and verified that I am speaking with the correct person using two identifiers.   I discussed the limitations, risks, security and privacy concerns of performing an evaluation and management service by telephone and the availability of in person appointments. I also discussed with the patient that there may be a patient responsible charge related to this service. The patient expressed understanding and agreed to proceed.   History of Present Illness: Today's tele-visit is for a acute office visit for increased cough.  Patient is present for today's visit at home, myself was present for today's visit at office  Patient is a 53 year old female followed by [name] for bronchiectasis.  She has multiple medical problems including rheumatoid arthritis and polymyositis and sleep apnea. She is recently been diagnosed with moderate to severe fibromuscular dysplasia. On ASA 81mg  daily .  Patient complains of 1 week of increased cough, fatigue, no energy. Cough is keeping her up at night . Describes cough at night as dry . Productive cough has increased from baseline during daytime that is darker tan color. Productive cough is intermittent. Denies fever or travel.  Check temp this am was 97.5.  Does have some body aches but unclear if this is part of her underlying chronic issues or not. No hemoptysis .  Appetite is good with no nausea vomiting or diarrhea.  She denies any loss of smell.  She is taking some Tylenol but none recently.  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 She is around staff. No one at her office has been diagnosed .  Says no one has acute illness that she knows of . Says her boss had sore throat last week for 1-2 days. One post office in [place] did have confirmed case of COVID,  Does not wear mask to work. Tries to do social distancing but hard to do this at  work.  She has not been wearing mask when she is out. Has not been to grocery stores. Does online ordering .  Not on oxygen . Has ONO done last week, results are pending .  Lives with daughter. Working from home. Husband works Marketing executive , wears mask if around people.  Check oxygen level today during visit with personal oximeter , O2 sats on room air 97% (after walking in home ) .   She has chronic bronchiectasis on flutter valve Four times a day  , Breo daily.    Observations/Objective: Personal temp check 4/14 - 97.5  Has barking cough during exam.   CXR 10/08/18 showed stable pulmonary fibrosis .   Assessment and Plan: Acute bronchiectatic exacerbation-with above symptoms lack of fever and desaturations appears to be a low risk for COVID-19 however unable to totally rule out as she works in [employer].  For now we will treat as a bronchiectatic exacerbation.  Hold on steroids.  Increase her pulmonary hygiene.  Long discussion regarding COVID-19 precautions with social distancing and home isolation.  As we are not doing outpatient testing and she does not have current signs and symptoms to require hospitalization would recommend that she monitor her symptoms at home check her oxygen levels twice daily call if oxygen levels are going down or less than 90% also to check her temperature daily and call if her temperatures greater than 100.  As of note patient is on immunosuppressants and therefore may  not mount a fever response.  I have notified patient of this and advised if her symptoms worsen or are not improving or she develops additional symptoms she is to call us immediately or seek emergency care.  Plan  Patient Instructions  Augmentin 875mg  Twice daily  For 1 week , take with food.  Increase Flutter valve 4 puffs 4 times a day. Mucinex 1200 mg twice daily. ( With full glass of water). Delsym 2 tsp At bedtime  As needed  Cough .  Continue Breo daily Rinse mouth after  use Continue Proventil as needed for breakthrough shortness of breath  Follow up in 4 weeks and As needed   Please contact office for sooner follow up if symptoms do not improve or worsen or seek emergency care   Please check oxygen level Twice daily  ,call if <90%.  Please check temperature daily , call if >100.0  If your symptoms worsen or develop nausea/vomitting please call sooner for follow up or go to ER.   May return to work on 12/01/18. Would wear mask and goggles to work . Practice social distancing .         Follow Up Instructions: Follow up in 4 weeks and As needed   Please contact office for sooner follow up if symptoms do not improve or worsen or seek emergency care     I discussed the assessment and treatment plan with the patient. The patient was provided an opportunity to ask questions and all were answered. The patient agreed with the plan and demonstrated an understanding of the instructions.   The patient was advised to call back or seek an in-person evaluation if the symptoms worsen or if the condition fails to improve as anticipated.  I provided 46 minutes of non-face-to-face time during this encounter.   [name], NP

## 2018-11-18 NOTE — Patient Instructions (Addendum)
Augmentin 875mg  Twice daily  For 1 week , take with food.  Increase Flutter valve 4 puffs 4 times a day. Mucinex 1200 mg twice daily. ( With full glass of water). Delsym 2 tsp At bedtime  As needed  Cough .  Continue Breo daily Rinse mouth after use Continue Proventil as needed for breakthrough shortness of breath  Follow up in 4 weeks and As needed   Please contact office for sooner follow up if symptoms do not improve or worsen or seek emergency care   Please check oxygen level Twice daily  ,call if <90%.  Please check temperature daily , call if >100.0  If your symptoms worsen or develop nausea/vomitting please call sooner for follow up or go to ER.   May return to work on 12/01/18. Would wear mask and goggles to work . Practice social distancing .

## 2018-11-20 ENCOUNTER — Ambulatory Visit (HOSPITAL_COMMUNITY): Payer: Federal, State, Local not specified - PPO

## 2018-11-20 ENCOUNTER — Telehealth: Payer: Self-pay | Admitting: Acute Care

## 2018-11-20 DIAGNOSIS — G4734 Idiopathic sleep related nonobstructive alveolar hypoventilation: Secondary | ICD-10-CM

## 2018-11-20 DIAGNOSIS — G4733 Obstructive sleep apnea (adult) (pediatric): Secondary | ICD-10-CM

## 2018-11-20 NOTE — Addendum Note (Signed)
Addended by: Lorretta Harp on: 11/20/2018 04:41 PM   Modules accepted: Orders

## 2018-11-20 NOTE — Telephone Encounter (Signed)
Called and spoke with pt letting her know the results of the ONO on room air on her CPAP and stated to her based on the results, she does qualify to have 2L O2 to be bled into her CPAP. Pt expressed understanding. Stated to pt we will place the order and then follow up with her at next OV with TP which is currently scheduled in May 2020. Pt expressed understanding.  Order has been placed. Nothing further needed.

## 2018-11-20 NOTE — Telephone Encounter (Signed)
Pt. Had O&O and had desaturations on her CPAP that qualify her for nocturnal oxygen.  Total of 1 hour 15 minutes and 36 seconds < 88% sats on RA on CPAP. This qualifies per Group 1 guidelines. Please order nocturnal oxygen at 2 L to be bled into her CPAP for nocturnal desaturations of < 88%. Schedule a follow up appointment once she has the oxygen set up . Thanks

## 2018-11-25 ENCOUNTER — Encounter (INDEPENDENT_AMBULATORY_CARE_PROVIDER_SITE_OTHER): Payer: Self-pay | Admitting: Family Medicine

## 2018-11-25 ENCOUNTER — Ambulatory Visit (INDEPENDENT_AMBULATORY_CARE_PROVIDER_SITE_OTHER): Payer: Federal, State, Local not specified - PPO | Admitting: Family Medicine

## 2018-11-25 ENCOUNTER — Ambulatory Visit (HOSPITAL_COMMUNITY): Payer: Federal, State, Local not specified - PPO

## 2018-11-25 ENCOUNTER — Other Ambulatory Visit: Payer: Self-pay

## 2018-11-25 DIAGNOSIS — E8881 Metabolic syndrome: Secondary | ICD-10-CM | POA: Diagnosis not present

## 2018-11-25 DIAGNOSIS — E559 Vitamin D deficiency, unspecified: Secondary | ICD-10-CM

## 2018-11-25 DIAGNOSIS — Z6841 Body Mass Index (BMI) 40.0 and over, adult: Secondary | ICD-10-CM

## 2018-11-25 MED ORDER — METFORMIN HCL 500 MG PO TABS: 500.0000 mg | ORAL_TABLET | Freq: Two times a day (BID) | ORAL | 0 refills | Status: DC

## 2018-11-26 NOTE — Progress Notes (Signed)
Office: [phone]  /  Fax: [phone] TeleHealth Visit:  Jill Harper has verbally consented to this TeleHealth visit today. The patient is located at home, the provider is located at the [facility] office. The participants in this visit include the listed provider and patient and any and all parties involved. The visit was conducted today via WebEx.  HPI:   Chief Complaint: OBESITY Brendan is here to discuss her progress with her obesity treatment plan. She is on the Category 2 plan and is following her eating plan approximately 98 % of the time. She states she is exercising 0 minutes 0 times per week. Linette has been ill with bronchiectasis, so she did not get all of the food in recently. She has not been weighing at home, but her clothing is fitting loosely. She likes the Category 2 plan. Riverlyn is being creative with spices and recipes. We were unable to weigh the patient today for this TeleHealth visit. She has lost 1 lb since starting treatment with [name].  Insulin Resistance Samamtha has a diagnosis of insulin resistance based on her elevated fasting insulin level >5. Although Yudit's blood glucose readings are still under good control, insulin resistance puts her at greater risk of metabolic syndrome and diabetes. Polyphagia has decreased with metformin. Kalle continues to work on diet and exercise to decrease risk of diabetes.  Vitamin D deficiency Aaliyha has a diagnosis of vitamin D deficiency. Her last vitamin D level was at 25 on 10/01/18 and is not at goal. She is currently taking OTC vit D and denies nausea, vomiting or muscle weakness.  ASSESSMENT AND PLAN:  Insulin resistance - Plan: metFORMIN (GLUCOPHAGE) 500 MG tablet  Vitamin D deficiency  Class 3 severe obesity with serious comorbidity and body mass index (BMI) of 40.0 to 44.9 in adult, unspecified obesity type ([place])  PLAN:  Insulin Resistance Evey will continue to work on weight loss, exercise, and decreasing  simple carbohydrates in her diet to help decrease the risk of diabetes. We dicussed metformin including benefits and risks. She was informed that eating too many simple carbohydrates or too many calories at one sitting increases the likelihood of GI side effects. Dany agreed to continue metformin 500 mg BID with meals #60 with no refills and follow up with [name] as directed to monitor her progress.  Vitamin D Deficiency Luan was informed that low vitamin D levels contributes to fatigue and are associated with obesity, breast, and colon cancer. She will continue to take OTC vitamin D (no prescription needed) and will follow up for routine testing of vitamin D, at least 2-3 times per year. She was informed of the risk of over-replacement of vitamin D and agrees to not increase her dose unless she discusses this with [name] first.  Obesity Ayelen is currently in the action stage of change. As such, her goal is to continue with weight loss efforts She has agreed to continue to follow the Category 2 plan We discussed the following Behavioral Modification Strategies today: planning for success, increasing lean protein intake and no skipping meals Abrie has not been prescribed exercise at this time. Taylorann has agreed to follow up with our clinic in 2 to 3 weeks. She was informed of the importance of frequent follow up visits to maximize her success with intensive lifestyle modifications for her multiple health conditions.  ALLERGIES: Allergies  Allergen Reactions  . Sulfonamide Derivatives     REACTION: hives    MEDICATIONS: Current Outpatient Medications  on File Prior to Visit  Medication Sig Dispense Refill  . albuterol (PROVENTIL HFA) 108 (90 Base) MCG/ACT inhaler Inhale 2 puffs into the lungs 4 (four) times daily as needed. Reported on 09/03/2015 1 Inhaler 4  . albuterol (PROVENTIL) (2.5 MG/3ML) 0.083% nebulizer solution Take 3 mLs (2.5 mg total) by nebulization every 6 (six) hours as needed for wheezing or  shortness of breath. 120 vial 5  . aspirin EC 81 MG tablet Take 81 mg by mouth daily.    [name] atenolol (TENORMIN) 25 MG tablet TAKE 1/2 TABLET BY MOUTH DAILY AS NEEDED FOR SYMPTOMS OF MITRAL PROLAPSE (Patient taking differently: Take 12.5 mg by mouth daily as needed (mitral prolapse). ) 45 tablet 1  . azaTHIOprine (IMURAN) 50 MG tablet TAKE 2 TABLETS(100 MG) BY MOUTH DAILY (Patient taking differently: Take 50 mg by mouth daily. ) 60 tablet 2  . eletriptan (RELPAX) 20 MG tablet One tablet by mouth at onset of headache. May repeat in 2 hours if headache persists or recurs. 10 tablet 0  . fluticasone furoate-vilanterol (BREO ELLIPTA) 100-25 MCG/INH AEPB Inhale 1 puff into the lungs daily. 60 each 5  . folic acid (FOLVITE) 1 MG tablet Take 2 tablets (2 mg total) by mouth daily. 180 tablet 3  . guaiFENesin (MUCINEX) 600 MG 12 hr tablet Take 2 tablets (1,200 mg total) by mouth 2 (two) times daily as needed for cough or to loosen phlegm. (Patient taking differently: Take 1,200 mg by mouth 2 (two) times daily. )    . Multiple Vitamins-Minerals (MULTIVITAMIN GUMMIES ADULT PO) Take 2 each by mouth daily.    [name] Respiratory Therapy Supplies (FLUTTER) DEVI Use as directed (Patient taking differently: 2 (two) times daily. Use as directed) 1 each 0  . telmisartan (MICARDIS) 20 MG tablet Take 1 tablet (20 mg total) by mouth daily. 30 tablet 5   No current facility-administered medications on file prior to visit.     PAST MEDICAL HISTORY: Past Medical History:  Diagnosis Date  . Abnormal liver function test   . Bronchiectasis       . Dyspnea   . Fibromuscular dysplasia ([place])   . Fibromyalgia   . Heart murmur   . ILD (interstitial lung disease) ([place])   . Joint pain   . Menorrhagia   . MIGRAINE HEADACHE   . MVP (mitral valve prolapse)   . Osteoarthritis   . Polymyositis ([place])    Dr [name]; chronic MTX  . Pulmonary fibrosis ([place])   . Rheumatoid arthritis ([place])   . Sleep apnea   . Vitamin B 12  deficiency   . Vitamin D deficiency     PAST SURGICAL HISTORY: Past Surgical History:  Procedure Laterality Date  . ablation uterine  2010  . CARDIAC CATHETERIZATION  2002   normal  . DILATION AND CURETTAGE OF UTERUS  08-31-08   Dr [name]  . IR ANGIO INTRA EXTRACRAN SEL COM CAROTID INNOMINATE BILAT MOD SED  09/18/2018  . IR ANGIO VERTEBRAL SEL VERTEBRAL BILAT MOD SED  09/18/2018  . IR US GUIDE VASC ACCESS RIGHT  09/18/2018    SOCIAL HISTORY: Social History   Tobacco Use  . Smoking status: Never Smoker  . Smokeless tobacco: Never Used  . Tobacco comment: Married, lives with spouse. 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 Alcohol use: Never    Frequency: Never    Comment: maybe a wine cooler once every other  year  . Drug use: Never    FAMILY HISTORY: Family History  Problem Relation Age of Onset  . Diabetes Mother   . Fibromyalgia Mother   . Ulcers Mother   . Heart failure Mother   . Thyroid disease Mother   . Obesity Mother   . Multiple myeloma Father   . Hypertension Father   . Stroke Father   . Lupus Sister   . Other Sister        abdominal adhesions resulting in bowel obstruction  . Migraines Sister   . Heart disease Brother        bypass surgery  . Rheum arthritis Sister   . Multiple myeloma Sister   . Hypertension Sister   . Heart attack Sister   . Diabetes Sister   . Hypertension Sister   . Rheum arthritis Sister   . Diabetes Sister   . Diabetes Brother   . Headache Other        siblings with headaches but not diagnosed as migraines    ROS: Review of Systems  Gastrointestinal: Negative for nausea and vomiting.  Musculoskeletal:       Negative for muscle weakness  Endo/Heme/Allergies:       Positive for polyphagia    PHYSICAL EXAM: Pt in no acute distress  RECENT LABS AND TESTS: BMET    Component Value Date/Time   NA 144 10/09/2018 1300   K 4.5 10/09/2018 1300   CL 106  10/09/2018 1300   CO2 17 (L) 10/09/2018 1300   GLUCOSE 101 (H) 10/09/2018 1300   GLUCOSE 84 09/22/2018 1407   BUN 7 10/09/2018 1300   CREATININE 0.82 10/09/2018 1300   CREATININE 0.74 09/22/2018 1407   CALCIUM 10.0 10/09/2018 1300   GFRNONAA 83 10/09/2018 1300   GFRNONAA 93 09/22/2018 1407   GFRAA 95 10/09/2018 1300   GFRAA 108 09/22/2018 1407   Lab Results  Component Value Date   HGBA1C 5.5 10/09/2018   HGBA1C 4.8 04/24/2016   Lab Results  Component Value Date   INSULIN 22.4 10/09/2018   CBC    Component Value Date/Time   WBC 7.9 09/22/2018 1407   RBC 5.18 (H) 09/22/2018 1407   HGB 14.5 09/22/2018 1407   HCT 44.0 09/22/2018 1407   PLT 313 09/22/2018 1407   MCV 84.9 09/22/2018 1407   MCH 28.0 09/22/2018 1407   MCHC 33.0 09/22/2018 1407   RDW 12.9 09/22/2018 1407   LYMPHSABS 1,509 09/22/2018 1407   MONOABS 666 01/31/2017 1006   EOSABS 119 09/22/2018 1407   BASOSABS 40 09/22/2018 1407   Iron/TIBC/Ferritin/ %Sat No results found for: IRON, TIBC, FERRITIN, IRONPCTSAT Lipid Panel     Component Value Date/Time   CHOL 141 10/09/2018 1300   TRIG 116 10/09/2018 1300   HDL 44 10/09/2018 1300   CHOLHDL 3 04/24/2016 0900   VLDL 11.8 04/24/2016 0900   LDLCALC 74 10/09/2018 1300   Hepatic Function Panel     Component Value Date/Time   PROT 7.8 10/09/2018 1300   ALBUMIN 4.6 10/09/2018 1300   AST 21 10/09/2018 1300   ALT 15 10/09/2018 1300   ALKPHOS 100 10/09/2018 1300   BILITOT 0.6 10/09/2018 1300   BILIDIR 0.1 04/03/2018 1457   IBILI 0.3 04/03/2018 1457      Component Value Date/Time   TSH 3.24 10/01/2018 1452   TSH 5.28 (H) 03/18/2018 1642   TSH 4.82 (H) 02/18/2018 1609     Ref. Range 10/01/2018 14:52  Vitamin D, 25-Hydroxy Latest Ref  Range: 30 - 100 ng/mL 25 (L)    I, Doreene Nest, am acting as Location manager for [employer], FNP-C.  I have reviewed the above documentation for accuracy and completeness, and I agree with the above.  - Charmin Aguiniga,  FNP-C.

## 2018-11-27 ENCOUNTER — Encounter (INDEPENDENT_AMBULATORY_CARE_PROVIDER_SITE_OTHER): Payer: Self-pay | Admitting: Family Medicine

## 2018-11-27 ENCOUNTER — Ambulatory Visit (HOSPITAL_COMMUNITY): Payer: Federal, State, Local not specified - PPO

## 2018-11-27 DIAGNOSIS — J471 Bronchiectasis with (acute) exacerbation: Secondary | ICD-10-CM | POA: Diagnosis not present

## 2018-11-27 DIAGNOSIS — R7303 Prediabetes: Secondary | ICD-10-CM | POA: Insufficient documentation

## 2018-11-27 DIAGNOSIS — E8881 Metabolic syndrome: Secondary | ICD-10-CM | POA: Insufficient documentation

## 2018-11-27 DIAGNOSIS — Z6841 Body Mass Index (BMI) 40.0 and over, adult: Secondary | ICD-10-CM

## 2018-11-27 DIAGNOSIS — G4733 Obstructive sleep apnea (adult) (pediatric): Secondary | ICD-10-CM | POA: Diagnosis not present

## 2018-12-02 ENCOUNTER — Ambulatory Visit (HOSPITAL_COMMUNITY): Payer: Federal, State, Local not specified - PPO

## 2018-12-04 ENCOUNTER — Ambulatory Visit (HOSPITAL_COMMUNITY): Payer: Federal, State, Local not specified - PPO

## 2018-12-05 DIAGNOSIS — J471 Bronchiectasis with (acute) exacerbation: Secondary | ICD-10-CM | POA: Diagnosis not present

## 2018-12-05 DIAGNOSIS — G4733 Obstructive sleep apnea (adult) (pediatric): Secondary | ICD-10-CM | POA: Diagnosis not present

## 2018-12-09 ENCOUNTER — Ambulatory Visit (HOSPITAL_COMMUNITY): Payer: Federal, State, Local not specified - PPO

## 2018-12-11 ENCOUNTER — Ambulatory Visit (HOSPITAL_COMMUNITY): Payer: Federal, State, Local not specified - PPO

## 2018-12-16 ENCOUNTER — Ambulatory Visit (HOSPITAL_COMMUNITY): Payer: Federal, State, Local not specified - PPO

## 2018-12-16 ENCOUNTER — Other Ambulatory Visit: Payer: Self-pay

## 2018-12-16 ENCOUNTER — Telehealth (HOSPITAL_COMMUNITY): Payer: Self-pay | Admitting: *Deleted

## 2018-12-16 ENCOUNTER — Encounter (INDEPENDENT_AMBULATORY_CARE_PROVIDER_SITE_OTHER): Payer: Self-pay | Admitting: Family Medicine

## 2018-12-16 ENCOUNTER — Ambulatory Visit (INDEPENDENT_AMBULATORY_CARE_PROVIDER_SITE_OTHER): Payer: Federal, State, Local not specified - PPO | Admitting: Family Medicine

## 2018-12-16 DIAGNOSIS — Z6841 Body Mass Index (BMI) 40.0 and over, adult: Secondary | ICD-10-CM

## 2018-12-16 DIAGNOSIS — E8881 Metabolic syndrome: Secondary | ICD-10-CM

## 2018-12-17 NOTE — Progress Notes (Signed)
Office: [phone]  /  Fax: [phone] TeleHealth Visit:  Jill Harper has verbally consented to this TeleHealth visit today. The patient is located at home, the provider is located at the [facility] office. The participants in this visit include the listed provider and patient. The visit was conducted today via [email or web address].  HPI:   Chief Complaint: OBESITY Jill Harper is here to discuss her progress with her obesity treatment plan. She is on the Category 2 plan and is following her eating plan approximately 95 % of the time. She states she is using a walking tape 30 minutes 2 to 3 times per week. Jill Harper feels that she is doing well with weight loss. She has lost 2 to 3 pounds since her last visit. Her hunger is controlled and she is happy on her Category 2 plan.Jill Harper has increased exercise and she feels well overall.  We were unable to weigh the patient today for this TeleHealth visit. She feels as if she has lost weight since her last visit. She has lost 1 lb since starting treatment with [name].  Insulin Resistance Jill Harper has a diagnosis of insulin resistance based on her elevated fasting insulin level >5. Although Jill Harper's blood glucose readings are still under good control, insulin resistance puts her at greater risk of metabolic syndrome and diabetes. She is forgetting to take her 2nd dose of  metformin and still struggles with afternoon polyphagia at times. This improves when she takes both doses. Jill Harper continues to work on diet and exercise to decrease risk of diabetes.  ASSESSMENT AND PLAN:  Insulin resistance  Class 3 severe obesity with serious comorbidity and body mass index (BMI) of 40.0 to 44.9 in adult, unspecified obesity type ([place])  PLAN:  Insulin Resistance Jill Harper will continue to work on weight loss, exercise, and decreasing simple carbohydrates in her diet to help decrease the risk of diabetes. She was informed that eating too many simple carbohydrates or too many calories  at one sitting increases the likelihood of GI side effects. Jill Harper agreed to continue metformin and we discussed ways to help her remember her 2nd dose at lunchtime. Jill Harper agreed to follow up with [name] as directed to monitor her progress in 2 weeks.  I spent > than 50% of the 15 minute visit on counseling as documented in the note.  Obesity Jill Harper is currently in the action stage of change. As such, her goal is to continue with weight loss efforts. She has agreed to follow the Category 2 plan. Jill Harper has been instructed to work up to a goal of 150 minutes of combined cardio and strengthening exercise per week for weight loss and overall health benefits. We discussed the following Behavioral Modification Strategies today: no skipping meals and keeping healthy foods in the home.  Jill Harper has agreed to follow up with our clinic in 2 weeks. She was informed of the importance of frequent follow up visits to maximize her success with intensive lifestyle modifications for her multiple health conditions.  ALLERGIES: Allergies  Allergen Reactions   Sulfonamide Derivatives     REACTION: hives    MEDICATIONS: Current Outpatient Medications on File Prior to Visit  Medication Sig Dispense Refill   albuterol (PROVENTIL HFA) 108 (90 Base) MCG/ACT inhaler Inhale 2 puffs into the lungs 4 (four) times daily as needed. Reported on 09/03/2015 1 Inhaler 4   albuterol (PROVENTIL) (2.5 MG/3ML) 0.083% nebulizer solution Take 3 mLs (2.5 mg total) by nebulization every 6 (six) hours as needed for  wheezing or shortness of breath. 120 vial 5   aspirin EC 81 MG tablet Take 81 mg by mouth daily.     atenolol (TENORMIN) 25 MG tablet TAKE 1/2 TABLET BY MOUTH DAILY AS NEEDED FOR SYMPTOMS OF MITRAL PROLAPSE (Patient taking differently: Take 12.5 mg by mouth daily as needed (mitral prolapse). ) 45 tablet 1   azaTHIOprine (IMURAN) 50 MG tablet TAKE 2 TABLETS(100 MG) BY MOUTH DAILY (Patient taking differently: Take 50 mg by mouth  daily. ) 60 tablet 2   eletriptan (RELPAX) 20 MG tablet One tablet by mouth at onset of headache. May repeat in 2 hours if headache persists or recurs. 10 tablet 0   fluticasone furoate-vilanterol (BREO ELLIPTA) 100-25 MCG/INH AEPB Inhale 1 puff into the lungs daily. 60 each 5   folic acid (FOLVITE) 1 MG tablet Take 2 tablets (2 mg total) by mouth daily. 180 tablet 3   guaiFENesin (MUCINEX) 600 MG 12 hr tablet Take 2 tablets (1,200 mg total) by mouth 2 (two) times daily as needed for cough or to loosen phlegm. (Patient taking differently: Take 1,200 mg by mouth 2 (two) times daily. )     metFORMIN (GLUCOPHAGE) 500 MG tablet Take 1 tablet (500 mg total) by mouth 2 (two) times daily with a meal. 60 tablet 0   Multiple Vitamins-Minerals (MULTIVITAMIN GUMMIES ADULT PO) Take 2 each by mouth daily.     Respiratory Therapy Supplies (FLUTTER) DEVI Use as directed (Patient taking differently: 2 (two) times daily. Use as directed) 1 each 0   telmisartan (MICARDIS) 20 MG tablet Take 1 tablet (20 mg total) by mouth daily. 30 tablet 5   No current facility-administered medications on file prior to visit.     PAST MEDICAL HISTORY: Past Medical History:  Diagnosis Date   Abnormal liver function test    Bronchiectasis        Dyspnea    Fibromuscular dysplasia ([facility])    Fibromyalgia    Heart murmur    ILD (interstitial lung disease) ([facility])    Joint pain    Menorrhagia    MIGRAINE HEADACHE    MVP (mitral valve prolapse)    Osteoarthritis    Polymyositis ([facility])    Dr [name]; chronic MTX   Pulmonary fibrosis ([facility])    Rheumatoid arthritis ([place])    Sleep apnea    Vitamin B 12 deficiency    Vitamin D deficiency     PAST SURGICAL HISTORY: Past Surgical History:  Procedure Laterality Date   ablation uterine  2010   CARDIAC CATHETERIZATION  2002   normal   DILATION AND CURETTAGE OF UTERUS  08-31-08   Dr [name]   IR ANGIO INTRA EXTRACRAN SEL COM CAROTID  INNOMINATE BILAT MOD SED  09/18/2018   IR ANGIO VERTEBRAL SEL VERTEBRAL BILAT MOD SED  09/18/2018   IR US GUIDE VASC ACCESS RIGHT  09/18/2018    SOCIAL HISTORY: Social History   Tobacco Use   Smoking status: Never Smoker   Smokeless tobacco: Never Used   Tobacco comment: Married, lives with spouse. 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  Substance Use Topics   Alcohol use: Never    Frequency: Never    Comment: maybe a wine cooler once every other year   Drug use: Never    FAMILY HISTORY: Family History  Problem Relation Age of Onset   Diabetes Mother    Fibromyalgia Mother    Ulcers Mother    Heart  failure Mother    Thyroid disease Mother    Obesity Mother    Multiple myeloma Father    Hypertension Father    Stroke Father    Lupus Sister    Other Sister        abdominal adhesions resulting in bowel obstruction   Migraines Sister    Heart disease Brother        bypass surgery   Rheum arthritis Sister    Multiple myeloma Sister    Hypertension Sister    Heart attack Sister    Diabetes Sister    Hypertension Sister    Rheum arthritis Sister    Diabetes Sister    Diabetes Brother    Headache Other        siblings with headaches but not diagnosed as migraines    ROS: Review of Systems  Gastrointestinal: Negative for nausea and vomiting.  Endo/Heme/Allergies:       Negative for hypoglycemia. Positive for polyphagia.    PHYSICAL EXAM: Pt in no acute distress  RECENT LABS AND TESTS: BMET    Component Value Date/Time   NA 144 10/09/2018 1300   K 4.5 10/09/2018 1300   CL 106 10/09/2018 1300   CO2 17 (L) 10/09/2018 1300   GLUCOSE 101 (H) 10/09/2018 1300   GLUCOSE 84 09/22/2018 1407   BUN 7 10/09/2018 1300   CREATININE 0.82 10/09/2018 1300   CREATININE 0.74 09/22/2018 1407   CALCIUM 10.0 10/09/2018 1300   GFRNONAA 83 10/09/2018 1300   GFRNONAA 93 09/22/2018 1407   GFRAA 95 10/09/2018 1300    GFRAA 108 09/22/2018 1407   Lab Results  Component Value Date   HGBA1C 5.5 10/09/2018   HGBA1C 4.8 04/24/2016   Lab Results  Component Value Date   INSULIN 22.4 10/09/2018   CBC    Component Value Date/Time   WBC 7.9 09/22/2018 1407   RBC 5.18 (H) 09/22/2018 1407   HGB 14.5 09/22/2018 1407   HCT 44.0 09/22/2018 1407   PLT 313 09/22/2018 1407   MCV 84.9 09/22/2018 1407   MCH 28.0 09/22/2018 1407   MCHC 33.0 09/22/2018 1407   RDW 12.9 09/22/2018 1407   LYMPHSABS 1,509 09/22/2018 1407   MONOABS 666 01/31/2017 1006   EOSABS 119 09/22/2018 1407   BASOSABS 40 09/22/2018 1407   Iron/TIBC/Ferritin/ %Sat No results found for: IRON, TIBC, FERRITIN, IRONPCTSAT Lipid Panel     Component Value Date/Time   CHOL 141 10/09/2018 1300   TRIG 116 10/09/2018 1300   HDL 44 10/09/2018 1300   CHOLHDL 3 04/24/2016 0900   VLDL 11.8 04/24/2016 0900   LDLCALC 74 10/09/2018 1300   Hepatic Function Panel     Component Value Date/Time   PROT 7.8 10/09/2018 1300   ALBUMIN 4.6 10/09/2018 1300   AST 21 10/09/2018 1300   ALT 15 10/09/2018 1300   ALKPHOS 100 10/09/2018 1300   BILITOT 0.6 10/09/2018 1300   BILIDIR 0.1 04/03/2018 1457   IBILI 0.3 04/03/2018 1457      Component Value Date/Time   TSH 3.24 10/01/2018 1452   TSH 5.28 (H) 03/18/2018 1642   TSH 4.82 (H) 02/18/2018 1609    Results for ANGEE, GUPTON (MRN [id number]) as of 12/17/2018 07:37  Ref. Range 10/01/2018 14:52  Vitamin D, 25-Hydroxy Latest Ref Range: 30 - 100 ng/mL 25 (L)    I, Marcille Blanco, CMA, am acting as transcriptionist for Starlyn Skeans, MD I have reviewed the above documentation for accuracy and completeness, and I  agree with the above. -Dennard Nip, MD

## 2018-12-18 ENCOUNTER — Ambulatory Visit (HOSPITAL_COMMUNITY): Payer: Federal, State, Local not specified - PPO

## 2018-12-18 ENCOUNTER — Other Ambulatory Visit: Payer: Self-pay

## 2018-12-18 ENCOUNTER — Encounter: Payer: Self-pay | Admitting: Acute Care

## 2018-12-18 ENCOUNTER — Ambulatory Visit (INDEPENDENT_AMBULATORY_CARE_PROVIDER_SITE_OTHER): Payer: Federal, State, Local not specified - PPO | Admitting: Acute Care

## 2018-12-18 DIAGNOSIS — J479 Bronchiectasis, uncomplicated: Secondary | ICD-10-CM | POA: Diagnosis not present

## 2018-12-18 DIAGNOSIS — J849 Interstitial pulmonary disease, unspecified: Secondary | ICD-10-CM

## 2018-12-18 NOTE — Patient Instructions (Addendum)
It is good to talk with you today I'm glad you are feeling better. Continue using your Flutter valve 4 puffs 2 times a day. Mucinex 1200 mg twice daily. ( With full glass of water). Delsym 2 tsp At bedtime  As needed  Cough .  Continue Breo daily Rinse mouth after use Continue Proventil as needed for breakthrough shortness of breath  Schedule for PFT's at office or at the hospital, whichever can do them first. HRCT chest without ILD protocol  to evaluate for progression of disease Follow up in 1 month with Tammy NP in the office  Continue wearing your oxygen at 2 L [place] at bedtime. Saturation goal is > 90% Please check oxygen level Twice daily  ,call if <90%.  Please check temperature daily , call if >100.0  If your symptoms worsen or develop nausea/vomitting please call sooner for follow up or go to ER.   Would wear mask and goggles to work . Practice social distancing .   Please contact office for sooner follow up if symptoms do not improve or worsen or seek emergency care 4:30 pm

## 2018-12-18 NOTE — Progress Notes (Signed)
Virtual Visit via Telephone Note  I connected with Jill Harper on 12/18/18 at  2:30 PM EDT by telephone and verified that I am speaking with the correct person using two identifiers.  Location: Patient: Was present in her home Provider: Present at [facility], [place] discussed the limitations, risks, security and privacy concerns of performing an evaluation and management service by telephone and the availability of in person appointments. I also discussed with the patient that there may be a patient responsible charge related to this service. The patient expressed understanding and agreed to proceed.  I  confirmed date of birth and address to authenticate patient  Identity. My nurse Quentin Ore reviewed medications and ordered any refills required.  Synopsis Patient is a 53 year old female never smoker followed by [name] for bronchiectasis.  She has multiple medical problems including rheumatoid arthritis ,basilar predominant fibrotic ILD  with progression since 2017. Findings are considered typical of usual interstitial pneumonia (UIP) pattern due to connective tissue Disease,  polymyositis and sleep apnea.  She is recently been diagnosed with moderate to severe fibromuscular dysplasia on ASA 81mg  daily . She is followed by [name].  PMH: Rheumatoid arthritis, polymyositis, maintained on Imuran per rheumatology Smoker/ Smoking History: Never smoker Maintenance: Breo Ellipta 100 Pt of: [name]  History of Present Illness: Pt. Presents for follow up. She had a virtual visit with Jill Harper , NP. She was treated for bronchiectasis flare with Augmentin , increased use of flutter valve, Mucinex, and Delsym. She states she recovered with treatment She is fatigued , but this is her baseline.She feels the antibiotic has really helped. Secretions are now lighter , less in volume. She is compliant with  her flutter valve She is compliant  with her  Breo and Proventil. She has had to use her rescue inhaler several evenings this week, which she does not usually have to do.. She does have increase in her shortness of breath than is her baseline. She feels this is because she is exerting herself, or overdoing. She had been checking her temp while on ABX , and it was normal.She did have one episode of decrease in her oxygen saturation to 87% last week with coughing spell.It self resolved.She checked her oxygen sat 5/13 and it was 96% on RA She has been wearing her nocturnal oxygen. She states she is sleeping much better with the oxygen, and is therefore more rested.She is followed through [facility] rehab. She looks forward to when they open again. She states her cough is at baseline.   Observations/Objective: Tests:  HST 07/28/18 >> AHI 5, SpO2 low 83%  Chest imaging: CT chest 03/23/05 >> patchy b/l lower lung ASD with cylindrical BTX CT chest 02/17/08 >> peripheral and basilar predominant subpleural GGO CT chest 12/01/08 >> no change HRCT chest 09/07/15 >> scattered GGO 04/15/2018-CT chest high-res- spectrum of findings compatible with basilar predominant fibrotic interstitial lung disease with progression since 2017,  UIP  Pulmonarytests: PFT 10/30/11 >> FEV1 3.31 (114%), FEV1% 85, TLC 6.06 (108%), DLCO 77%, no BD PFT 11/18/12 >> FEV1 3.18 (111%), FEV1% 84, TLC 5.34 (95%), DLCO 67%, no BD PFT 12/02/15 >> FEV1 2.96 (108%), FEV1% 92, TLC 4.95 (86%), DLCO 65%, no BD  04/17/2018-spirometry with DLCO-DLCO 76%  Serology 01/31/17 >> ANA negative, RF 29, anti CCP 213, SCL 70 negative, SSA/SSB negative, ENA SM negative, Jo-1 positive Quantiferon gold 01/31/17 >> negative  Cardiac tests: Echo 05/29/17 >> EF 60  to 65%, mod LVH, grade 1 DD Echo 05/23/18 >> mod LVH, EF 60 to 65%, grade 1 DD, mild MR   Assessment and Plan: Bronchiectasis Flare >> Resolving Plan Continue using your Flutter valve 4 puffs 2 times a day. Mucinex 1200 mg twice daily.  ( With full glass of water). Delsym 2 tsp At bedtime  As needed  Cough .  Continue Breo daily Rinse mouth after use Continue Proventil as needed for breakthrough shortness of breath  Please check oxygen level Twice daily  ,call if <90%.  Please check temperature daily , call if >100.0   ILD Plan Schedule for PFT's at office or at the hospital, whichever can do them first. HRCT chest without contrast ILD protocol  to evaluate for progression of disease Follow up in 1 month with [name] NP  in the office  Continue wearing your oxygen at 2 L [place] at bedtime. Saturation goal is > 90% Please contact office for sooner follow up if symptoms do not improve or worsen or seek emergency care  Consider 6 minute walk at follow up Resume Pulmonary Rehab once it resumes  Continue to  wear mask and goggles to work .  Practice social distancing .  Please contact office for sooner follow up if symptoms do not improve or worsen or seek emergency care    Follow Up Instructions: Follow up with [name] NP 01/19/2019 at 4:30 pm   I discussed the assessment and treatment plan with the patient. The patient was provided an opportunity to ask questions and all were answered. The patient agreed with the plan and demonstrated an understanding of the instructions.   The patient was advised to call back or seek an in-person evaluation if the symptoms worsen or if the condition fails to improve as anticipated.  I provided 22 minutes of non-face-to-face time during this encounter.   [name], NP 12/18/2018 4:04 PM

## 2018-12-18 NOTE — Progress Notes (Signed)
Office Visit Note  Patient: Jill Harper             Date of Birth: 11-05-1965           MRN: [id number]             PCP: Binnie Rail, MD Referring: Binnie Rail, MD Visit Date: 01/01/2019 Occupation: '@GUAROCC'$ @  Subjective:  Fatigue    History of Present Illness: Jill Harper is a 53 y.o. female with history of seropositive rheumatoid arthritis, polymyositis, ILD, osteoarthritis, and fibromyalgia.  She is taking Imuran 50 mg 2 tablets by mouth daily.  She denies any recent rheumatoid arthritis or polymyositis flares.  She notices some days she feels weaker than others but overall her symptoms have been stable. She has intermittent pain and stiffness in both hands but denies any joint swelling.     She reports about 1 month ago she had an acute flare of bronchiectasis 1 month ago and was treated with a 10-day course of antibiotics and during that time she  held Imuran.  She has recently been desaturating at night, and she was started on 2 L of oxygen at night.  She is scheduled for a repeat PFT and will resume pulmonary rehab once it reopens.    Activities of Daily Living:  Patient reports morning stiffness for <5 minutes.   Patient Denies nocturnal pain.  Difficulty dressing/grooming: Denies Difficulty climbing stairs: Denies Difficulty getting out of chair: Reports Difficulty using hands for taps, buttons, cutlery, and/or writing: Denies  Review of Systems  Constitutional: Positive for fatigue.  HENT: Negative for mouth sores, mouth dryness and nose dryness.   Eyes: Negative for pain, visual disturbance and dryness.  Respiratory: Positive for cough. Negative for hemoptysis, shortness of breath and difficulty breathing.   Cardiovascular: Negative for chest pain, palpitations, hypertension and swelling in legs/feet.  Gastrointestinal: Negative for blood in stool, constipation and diarrhea.  Endocrine: Negative for increased urination.  Genitourinary: Negative for painful  urination.  Musculoskeletal: Positive for morning stiffness. Negative for arthralgias, joint pain, joint swelling, myalgias, muscle weakness, muscle tenderness and myalgias.  Skin: Positive for sensitivity to sunlight. Negative for color change, pallor, rash, hair loss, nodules/bumps, skin tightness and ulcers.  Allergic/Immunologic: Negative for susceptible to infections.  Neurological: Positive for headaches. Negative for dizziness, numbness and weakness.  Hematological: Negative for swollen glands.  Psychiatric/Behavioral: Negative for depressed mood and sleep disturbance. The patient is not nervous/anxious.     PMFS History:  Patient Active Problem List   Diagnosis Date Noted   Insulin resistance 11/27/2018   Class 3 severe obesity with serious comorbidity and body mass index (BMI) of 40.0 to 44.9 in adult [facility]) 11/27/2018   Fibromuscular dysplasia ([place]) 10/17/2018   Hypertension 10/17/2018   Bronchiectasis with (acute) exacerbation ([place]) 10/08/2018   OSA (obstructive sleep apnea) 08/20/2018   Rheumatoid arthritis ([place]) 04/17/2018   ILD (interstitial lung disease) ([place]) 04/17/2018   Dyspnea 04/01/2018   Healthcare maintenance 04/01/2018   Elevated TSH 03/18/2018   Non-intractable vomiting without nausea 03/18/2018   Dysphagia 03/18/2018   Bronchiectasis ([place]) 09/02/2017   LVH (left ventricular hypertrophy) 06/02/2017   Mitral regurgitation 05/22/2017   Wheezing 04/05/2017   Jo-1 antibody positive 01/29/2017   Primary osteoarthritis of both feet 01/29/2017   Primary osteoarthritis of both hands 01/29/2017   Trapezoid ligament sprain 01/17/2017   Left shoulder tendonitis 01/17/2017   Vitamin D deficiency 04/26/2016   High risk medication use 03/26/2016   Frozen  shoulder, right 03/26/2016   Morbid obesity ([place]) 03/26/2016   Cough 07/18/2015   Neck mass 08/27/2013   MVP (mitral valve prolapse)    Erythema nodosum    Throat clearing 05/07/2012     INCONTINENCE, URGE 05/26/2010   Migraine headache 02/15/2010   Fibromyalgia 06/20/2009   Polymyositis ([place]) 02/11/2008   BRONCHIECTASIS 02/10/2008    Past Medical History:  Diagnosis Date   Abnormal liver function test    Bronchiectasis        Dyspnea    Fibromuscular dysplasia (HCC)    Fibromyalgia    Heart murmur    ILD (interstitial lung disease) ([place])    Joint pain    Menorrhagia    MIGRAINE HEADACHE    MVP (mitral valve prolapse)    Osteoarthritis    Polymyositis (HCC)    Dr [name]; chronic MTX   Pulmonary fibrosis ([place])    Rheumatoid arthritis ([place])    Sleep apnea    Vitamin B 12 deficiency    Vitamin D deficiency     Family History  Problem Relation Age of Onset   Diabetes Mother    Fibromyalgia Mother    Ulcers Mother    Heart failure Mother    Thyroid disease Mother    Obesity Mother    Multiple myeloma Father    Hypertension Father    Stroke Father    Lupus Sister    Other Sister        abdominal adhesions resulting in bowel obstruction   Migraines Sister    Heart disease Brother        bypass surgery   Rheum arthritis Sister    Multiple myeloma Sister    Hypertension Sister    Heart attack Sister    Diabetes Sister    Hypertension Sister    Rheum arthritis Sister    Diabetes Sister    Diabetes Brother    Headache Other        siblings with headaches but not diagnosed as migraines   Past Surgical History:  Procedure Laterality Date   ablation uterine  2010   CARDIAC CATHETERIZATION  2002   normal   DILATION AND CURETTAGE OF UTERUS  08-31-08   Dr [name]   IR [place] SEL COM CAROTID INNOMINATE BILAT MOD SED  09/18/2018   IR ANGIO VERTEBRAL SEL VERTEBRAL BILAT MOD SED  09/18/2018   IR US GUIDE VASC ACCESS RIGHT  09/18/2018   Social History   Social History Narrative   Exercise: trying to walk - limited by fatigue   Lives at home with husband and daughter    Right handed   Caffeine: occasional coke 1-2x per month   Immunization History  Administered Date(s) Administered   Influenza Split 04/10/2011, 06/18/2012, 05/06/2013   Influenza Whole 05/19/2008, 05/15/2009, 06/01/2010   Influenza,inj,Quad PF,6+ Mos 04/20/2014, 05/26/2015, 05/31/2016, 04/05/2017, 06/16/2018   Pneumococcal Conjugate-13 01/29/2017   Pneumococcal Polysaccharide-23 08/07/2007, 06/16/2018   Td 07/12/2009     Objective: Vital Signs: BP 110/65 (BP Location: Left Arm, Patient Position: Sitting, Cuff Size: Normal)    Pulse 88    Resp 15    Ht _0  (1.727 m)    Wt 273 lb (123.8 kg)    BMI 41.51 kg/m    Physical Exam Vitals signs and nursing note reviewed.  Constitutional:      Appearance: She is well-developed.  HENT:     Head: Normocephalic and atraumatic.  Eyes:     Conjunctiva/sclera: Conjunctivae  normal.  Neck:     Musculoskeletal: Normal range of motion.  Cardiovascular:     Rate and Rhythm: Normal rate and regular rhythm.     Heart sounds: Normal heart sounds.  Pulmonary:     Effort: Pulmonary effort is normal.     Breath sounds: Rales present.  Abdominal:     General: Bowel sounds are normal.     Palpations: Abdomen is soft.  Lymphadenopathy:     Cervical: No cervical adenopathy.  Skin:    General: Skin is warm and dry.     Capillary Refill: Capillary refill takes less than 2 seconds.  Neurological:     Mental Status: She is alert and oriented to person, place, and time.  Psychiatric:        Behavior: Behavior normal.      Musculoskeletal Exam: C-spine, thoracic spine, and lumbar spine good ROM.  No midline spinal tenderness.  No SI joint tenderness.  Shoulder joints, elbow joints, wrist joints, MCPs, PIPs,and DIPs good ROM with no synovitis.  Hip joints, knee joints, ankle joints, MTPs, PIPs, and DIPs good ROM with no synovitis.  No warmth or effusion of knee joints.  No tenderness or swelling of ankle joints.   CDAI Exam: CDAI Score: Not  documented Patient Global Assessment: Not documented; Provider Global Assessment: Not documented Swollen: Not documented; Tender: Not documented Joint Exam   Not documented   There is currently no information documented on the homunculus. Go to the Rheumatology activity and complete the homunculus joint exam.  Investigation: No additional findings.  Imaging: No results found.  Recent Labs: Lab Results  Component Value Date   WBC 7.9 09/22/2018   HGB 14.5 09/22/2018   PLT 313 09/22/2018   NA 144 10/09/2018   K 4.5 10/09/2018   CL 106 10/09/2018   CO2 17 (L) 10/09/2018   GLUCOSE 101 (H) 10/09/2018   BUN 7 10/09/2018   CREATININE 0.82 10/09/2018   BILITOT 0.6 10/09/2018   ALKPHOS 100 10/09/2018   AST 21 10/09/2018   ALT 15 10/09/2018   PROT 7.8 10/09/2018   ALBUMIN 4.6 10/09/2018   CALCIUM 10.0 10/09/2018   GFRAA 95 10/09/2018    Speciality Comments: No specialty comments available.  Procedures:  No procedures performed Allergies: Sulfonamide derivatives   Assessment / Plan:     Visit Diagnoses: Rheumatoid arthritis involving multiple sites with positive rheumatoid factor (HCC) -  +RF, +CCP: She has no synovitis on exam.  She has not had any recent rheumatoid arthritis flares.  She is clinically doing well on Imuran 50 mg 2 tablets by mouth daily.  She has no joint pain or joint swelling at this time.  She will continue on his current treatment regimen.  She was advised to notify us that shows increased joint pain or joint swelling.  She will follow-up in the office in 4 months.  Polymyositis ([place]) - Dx by Dr. Charlestine Night 2007, CK 801, Jo-1 positive: She has not had any recent flares.  She has some days where she feels more muscle weakness than others but overall her symptoms have been stable.  She has no difficulty getting up from a chair raising her arms above her head.  She will continue on Imuran 50 mg 2 tablets by mouth daily.  She was advised to notify us if she develops  any new or worsening symptoms  High risk medication use  - Imuran 100 mg po daily.  Discontinue methotrexate due to further progression of ILD. Most  recent CMP within normal limits on 10/09/2018.  Most recent CBC within normal limits except for slightly elevated RBC on 09/22/2018.  Due for CBC/CMP today and will monitor every 3 months.  Standing orders are in place.  She received her flu vaccine in November and previously Pneumovax 23 and Prevnar 13 vaccines.  She is aware that she will hold Imuran anytime she has an infection and to resume once the infection has cleared.- Plan: CBC with Differential/Platelet, COMPLETE METABOLIC PANEL WITH GFR  Jo-1 antibody positive  ILD (interstitial lung disease) ([place]) - Dr. Vickii Penna had a virtual visit on 12/18/2018 with her pulmonologist.  She was scheduled for a future PFT.  She was also started on 2 L of oxygen at bedtime.  She will resume pulmonary rehab once the facility reopens.  She is also going to be scheduled for a high-resolution chest CT to evaluate for progression.  She has not noticed any new or worsening symptoms. She checks her pulse ox at home on a regular basis.  Bronchiectasis without complication ([place]): She had acute flare of bronchiectasis about 1 month ago and was started on a 10-day course of antibiotics which she completed.  While on antibiotics she held Imuran.  She was advised to continue using Mucinex 1200 mg twice daily and Delsym 2 teaspoons at bedtime for cough.  She advised continue on Breo.  Primary osteoarthritis of both hands: She has intermittent pain and stiffness in both hands.  She has no joint swelling.  Complete fist formation bilaterally.  Joint protection and muscle strengthening were discussed.   Primary osteoarthritis of both feet: She has no feet pain or joint swelling at this time.   Fibromyalgia: She has some generalized muscle aches muscle tenderness due to fibromyalgia.  Her level of fatigue has been stable overall  lately.  Erythema nodosum: She has no nodules at this time.  Fibromuscular dysplasia [facility]): She is taking telmisaratan 20 mg po daily.  Vitamin D deficiency -Vitamin D level will be checked today. Plan: VITAMIN D 25 Hydroxy (Vit-D Deficiency, Fractures)  Other fatigue: Chronic   Other medical conditions are listed as follows:  History of mitral valve prolapse  History of migraine   Orders: Orders Placed This Encounter  Procedures   CBC with Differential/Platelet   COMPLETE METABOLIC PANEL WITH GFR   VITAMIN D 25 Hydroxy (Vit-D Deficiency, Fractures)   No orders of the defined types were placed in this encounter.   Face-to-face time spent with patient was 30 minutes. Greater than 50% of time was spent in counseling and coordination of care.  Follow-Up Instructions: Return in about 4 months (around 05/04/2019) for Polymyositis, Rheumatoid arthritis, Fibromyalgia.   Ofilia Neas, PA-C    I examined and evaluated the patient with Hazel Sams PA.  Patient had no synovitis on examination.  She had although I am concerned about progression of her interstitial lung disease.  I would like input of [name] regarding that.  The plan of care was discussed as noted above.  Bo Merino, MD  Note - This record has been created using Editor, commissioning.  Chart creation errors have been sought, but may not always  have been located. Such creation errors do not reflect on  the standard of medical care.

## 2018-12-23 ENCOUNTER — Ambulatory Visit (HOSPITAL_COMMUNITY): Payer: Federal, State, Local not specified - PPO

## 2018-12-25 ENCOUNTER — Ambulatory Visit (HOSPITAL_COMMUNITY): Payer: Federal, State, Local not specified - PPO

## 2018-12-27 DIAGNOSIS — G4733 Obstructive sleep apnea (adult) (pediatric): Secondary | ICD-10-CM | POA: Diagnosis not present

## 2018-12-27 DIAGNOSIS — J471 Bronchiectasis with (acute) exacerbation: Secondary | ICD-10-CM | POA: Diagnosis not present

## 2018-12-30 ENCOUNTER — Ambulatory Visit (HOSPITAL_COMMUNITY): Payer: Federal, State, Local not specified - PPO

## 2018-12-30 ENCOUNTER — Other Ambulatory Visit: Payer: Self-pay | Admitting: Rheumatology

## 2018-12-30 MED ORDER — AZATHIOPRINE 50 MG PO TABS: ORAL_TABLET | ORAL | 2 refills | Status: DC

## 2018-12-30 NOTE — Telephone Encounter (Signed)
Last Visit: 10/01/18 Next Visit: 01/01/19 Labs: 09/22/18 CMP WNL. RBC count is borderline elevated. Rest of CBC WNL.   Patient advised we would need to recheck vitamin d level before we would be able to refill vitam in d patient verbalized understanding. Patient will have it recheck at her appointment. On 01/01/19  Okay to refill Imuran per Dr. Estanislado Pandy

## 2018-12-31 ENCOUNTER — Ambulatory Visit (INDEPENDENT_AMBULATORY_CARE_PROVIDER_SITE_OTHER): Payer: Federal, State, Local not specified - PPO | Admitting: Family Medicine

## 2018-12-31 ENCOUNTER — Other Ambulatory Visit: Payer: Self-pay

## 2018-12-31 ENCOUNTER — Encounter (INDEPENDENT_AMBULATORY_CARE_PROVIDER_SITE_OTHER): Payer: Self-pay | Admitting: Family Medicine

## 2018-12-31 DIAGNOSIS — E8881 Metabolic syndrome: Secondary | ICD-10-CM

## 2018-12-31 DIAGNOSIS — Z6841 Body Mass Index (BMI) 40.0 and over, adult: Secondary | ICD-10-CM | POA: Diagnosis not present

## 2018-12-31 MED ORDER — METFORMIN HCL 500 MG PO TABS: 500.0000 mg | ORAL_TABLET | Freq: Two times a day (BID) | ORAL | 0 refills | Status: DC

## 2018-12-31 NOTE — Progress Notes (Signed)
Office: [phone]  /  Fax: [phone] TeleHealth Visit:  Jill Harper has verbally consented to this TeleHealth visit today. The Harper is located at home, the provider is located at the [employer] [facility] office. The participants in this visit include the listed provider and Harper. The visit was conducted today via [email or web address].  HPI:   Chief Complaint: OBESITY Jill Harper is here to discuss her progress with her obesity treatment plan. She is on the Category 2 plan and is following her eating plan approximately 96 % of the time. She states she is exercising 0 minutes 0 times per week. Jill Harper continues to do well with her Category 2 plan. Her father in law died last night so she naturally has been off track. There will be no funeral, so she is not concerned about being required to eat funeral food.  We were unable to weigh the Harper today for this TeleHealth visit. She feels as if she has maintained weight since her last visit. She has lost 1 lb since starting treatment with [name].  Insulin Resistance Jill Harper has a diagnosis of insulin resistance based on her elevated fasting insulin level >5. Although Jill Harper's blood glucose readings are still under good control, insulin resistance puts her at greater risk of metabolic syndrome and diabetes. She is stable on metformin currently and is doing well on her diet prescription. She continues to work on diet and exercise to decrease risk of diabetes.  ASSESSMENT AND PLAN:  Insulin Resistance - Plan: metFORMIN (GLUCOPHAGE) 500 MG tablet  Class 3 severe obesity with serious comorbidity and body mass index (BMI) of 40.0 to 44.9 in adult, unspecified obesity type (Jill Harper)  PLAN:  Insulin Resistance Jill Harper will continue to work on weight loss, exercise, and decreasing simple carbohydrates in her diet to help decrease the risk of diabetes. She was informed that eating too many simple carbohydrates or too many calories at one sitting increases the likelihood of  GI side effects. Jill Harper agreed to continue metformin 500 mg BID #60 with no refills and prescription was written today. Jill Harper agreed to follow up with [name] as directed to monitor her progress in 2 weeks.  Obesity Jill Harper is currently in the action stage of change. As such, her goal is to continue with weight loss efforts. She has agreed to follow the Category 2 plan. Jill Harper has been instructed to work up to a goal of 150 minutes of combined cardio and strengthening exercise per week for weight loss and overall health benefits. We discussed the following Behavioral Modification Strategies today: work on meal planning, keeping healthy foods in the home, and easy cooking plans and emotional eating strategies.  Jill Harper has agreed to follow up with our clinic in 2 weeks. She was informed of the importance of frequent follow up visits to maximize her success with intensive lifestyle modifications for her multiple health conditions.  ALLERGIES: Allergies  Allergen Reactions  . Sulfonamide Derivatives     REACTION: hives    MEDICATIONS: Current Outpatient Medications on File Prior to Visit  Medication Sig Dispense Refill  . albuterol (PROVENTIL HFA) 108 (90 Base) MCG/ACT inhaler Inhale 2 puffs into the lungs 4 (four) times daily as needed. Reported on 09/03/2015 1 Inhaler 4  . albuterol (PROVENTIL) (2.5 MG/3ML) 0.083% nebulizer solution Take 3 mLs (2.5 mg total) by nebulization every 6 (six) hours as needed for wheezing or shortness of breath. 120 vial 5  . aspirin EC 81 MG tablet Take 81 mg by mouth daily.    [name]  atenolol (TENORMIN) 25 MG tablet TAKE 1/2 TABLET BY MOUTH DAILY AS NEEDED FOR SYMPTOMS OF MITRAL PROLAPSE (Harper taking differently: Take 12.5 mg by mouth daily as needed (mitral prolapse). ) 45 tablet 1  . azaTHIOprine (IMURAN) 50 MG tablet TAKE 2 TABLETS(100 MG) BY MOUTH DAILY 60 tablet 2  . eletriptan (RELPAX) 20 MG tablet One tablet by mouth at onset of headache. May repeat in 2 hours if headache  persists or recurs. 10 tablet 0  . fluticasone furoate-vilanterol (BREO ELLIPTA) 100-25 MCG/INH AEPB Inhale 1 puff into the lungs daily. 60 each 5  . folic acid (FOLVITE) 1 MG tablet Take 2 tablets (2 mg total) by mouth daily. 180 tablet 3  . guaiFENesin (MUCINEX) 600 MG 12 hr tablet Take 2 tablets (1,200 mg total) by mouth 2 (two) times daily as needed for cough or to loosen phlegm. (Harper taking differently: Take 1,200 mg by mouth 2 (two) times daily. )    . Multiple Vitamins-Minerals (MULTIVITAMIN GUMMIES ADULT PO) Take 2 each by mouth daily.    [name] Respiratory Therapy Supplies (FLUTTER) DEVI Use as directed (Harper taking differently: 2 (two) times daily. Use as directed) 1 each 0  . telmisartan (MICARDIS) 20 MG tablet Take 1 tablet (20 mg total) by mouth daily. 30 tablet 5   No current facility-administered medications on file prior to visit.     PAST MEDICAL HISTORY: Past Medical History:  Diagnosis Date  . Abnormal liver function test   . Bronchiectasis       . Dyspnea   . Fibromuscular dysplasia ([place])   . Fibromyalgia   . Heart murmur   . ILD (interstitial lung disease) ([place])   . Joint pain   . Menorrhagia   . MIGRAINE HEADACHE   . MVP (mitral valve prolapse)   . Osteoarthritis   . Polymyositis ([place])    Dr [name]; chronic MTX  . Pulmonary fibrosis ([place] Harper)   . Rheumatoid arthritis ([place])   . Sleep apnea   . Vitamin B 12 deficiency   . Vitamin D deficiency     PAST SURGICAL HISTORY: Past Surgical History:  Procedure Laterality Date  . ablation uterine  2010  . CARDIAC CATHETERIZATION  2002   normal  . DILATION AND CURETTAGE OF UTERUS  08-31-08   Dr [name]  . IR ANGIO INTRA EXTRACRAN SEL COM CAROTID INNOMINATE BILAT MOD SED  09/18/2018  . IR ANGIO VERTEBRAL SEL VERTEBRAL BILAT MOD SED  09/18/2018  . IR US GUIDE VASC ACCESS RIGHT  09/18/2018    SOCIAL HISTORY: Social History   Tobacco Use  . Smoking status: Never Smoker  . Smokeless tobacco: Never  Used  . Tobacco comment: Married, lives with spouse. 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 Alcohol use: Never    Frequency: Never    Comment: maybe a wine cooler once every other year  . Drug use: Never    FAMILY HISTORY: Family History  Problem Relation Age of Onset  . Diabetes Mother   . Fibromyalgia Mother   . Ulcers Mother   . Heart failure Mother   . Thyroid disease Mother   . Obesity Mother   . Multiple myeloma Father   . Hypertension Father   . Stroke Father   . Lupus Sister   . Other Sister        abdominal adhesions resulting in bowel obstruction  . Migraines Sister   . Heart  disease Brother        bypass surgery  . Rheum arthritis Sister   . Multiple myeloma Sister   . Hypertension Sister   . Heart attack Sister   . Diabetes Sister   . Hypertension Sister   . Rheum arthritis Sister   . Diabetes Sister   . Diabetes Brother   . Headache Other        siblings with headaches but not diagnosed as migraines    ROS: Review of Systems  Gastrointestinal: Negative for nausea and vomiting.  Endo/Heme/Allergies:       Negative for hypoglycemia.    PHYSICAL EXAM: Pt in no acute distress  RECENT LABS AND TESTS: BMET    Component Value Date/Time   NA 144 10/09/2018 1300   K 4.5 10/09/2018 1300   CL 106 10/09/2018 1300   CO2 17 (L) 10/09/2018 1300   GLUCOSE 101 (H) 10/09/2018 1300   GLUCOSE 84 09/22/2018 1407   BUN 7 10/09/2018 1300   CREATININE 0.82 10/09/2018 1300   CREATININE 0.74 09/22/2018 1407   CALCIUM 10.0 10/09/2018 1300   GFRNONAA 83 10/09/2018 1300   GFRNONAA 93 09/22/2018 1407   GFRAA 95 10/09/2018 1300   GFRAA 108 09/22/2018 1407   Lab Results  Component Value Date   HGBA1C 5.5 10/09/2018   HGBA1C 4.8 04/24/2016   Lab Results  Component Value Date   INSULIN 22.4 10/09/2018   CBC    Component Value Date/Time   WBC 7.9 09/22/2018 1407   RBC 5.18 (H) 09/22/2018 1407   HGB  14.5 09/22/2018 1407   HCT 44.0 09/22/2018 1407   PLT 313 09/22/2018 1407   MCV 84.9 09/22/2018 1407   MCH 28.0 09/22/2018 1407   MCHC 33.0 09/22/2018 1407   RDW 12.9 09/22/2018 1407   LYMPHSABS 1,509 09/22/2018 1407   MONOABS 666 01/31/2017 1006   EOSABS 119 09/22/2018 1407   BASOSABS 40 09/22/2018 1407   Iron/TIBC/Ferritin/ %Sat No results found for: IRON, TIBC, FERRITIN, IRONPCTSAT Lipid Panel     Component Value Date/Time   CHOL 141 10/09/2018 1300   TRIG 116 10/09/2018 1300   HDL 44 10/09/2018 1300   CHOLHDL 3 04/24/2016 0900   VLDL 11.8 04/24/2016 0900   LDLCALC 74 10/09/2018 1300   Hepatic Function Panel     Component Value Date/Time   PROT 7.8 10/09/2018 1300   ALBUMIN 4.6 10/09/2018 1300   AST 21 10/09/2018 1300   ALT 15 10/09/2018 1300   ALKPHOS 100 10/09/2018 1300   BILITOT 0.6 10/09/2018 1300   BILIDIR 0.1 04/03/2018 1457   IBILI 0.3 04/03/2018 1457      Component Value Date/Time   TSH 3.24 10/01/2018 1452   TSH 5.28 (H) 03/18/2018 1642   TSH 4.82 (H) 02/18/2018 1609   Results for GENNIFER, POTENZA (MRN [id number]) as of 12/31/2018 13:35  Ref. Range 10/01/2018 14:52  Vitamin D, 25-Hydroxy Latest Ref Range: 30 - 100 ng/mL 25 (L)     I, Marcille Blanco, CMA, am acting as transcriptionist for Starlyn Skeans, MD I have reviewed the above documentation for accuracy and completeness, and I agree with the above. -Dennard Nip, MD

## 2019-01-01 ENCOUNTER — Ambulatory Visit (HOSPITAL_COMMUNITY): Payer: Federal, State, Local not specified - PPO

## 2019-01-01 ENCOUNTER — Telehealth (HOSPITAL_COMMUNITY): Payer: Self-pay | Admitting: *Deleted

## 2019-01-01 ENCOUNTER — Other Ambulatory Visit: Payer: Self-pay

## 2019-01-01 ENCOUNTER — Encounter: Payer: Self-pay | Admitting: Physician Assistant

## 2019-01-01 ENCOUNTER — Ambulatory Visit: Payer: Federal, State, Local not specified - PPO | Admitting: Physician Assistant

## 2019-01-01 VITALS — BP 110/65 | HR 88 | Resp 15 | Ht 68.0 in | Wt 273.0 lb

## 2019-01-01 DIAGNOSIS — Z8679 Personal history of other diseases of the circulatory system: Secondary | ICD-10-CM

## 2019-01-01 DIAGNOSIS — M0579 Rheumatoid arthritis with rheumatoid factor of multiple sites without organ or systems involvement: Secondary | ICD-10-CM | POA: Diagnosis not present

## 2019-01-01 DIAGNOSIS — L52 Erythema nodosum: Secondary | ICD-10-CM

## 2019-01-01 DIAGNOSIS — M19041 Primary osteoarthritis, right hand: Secondary | ICD-10-CM

## 2019-01-01 DIAGNOSIS — M797 Fibromyalgia: Secondary | ICD-10-CM

## 2019-01-01 DIAGNOSIS — Z8669 Personal history of other diseases of the nervous system and sense organs: Secondary | ICD-10-CM

## 2019-01-01 DIAGNOSIS — E559 Vitamin D deficiency, unspecified: Secondary | ICD-10-CM | POA: Diagnosis not present

## 2019-01-01 DIAGNOSIS — M3320 Polymyositis, organ involvement unspecified: Secondary | ICD-10-CM

## 2019-01-01 DIAGNOSIS — R768 Other specified abnormal immunological findings in serum: Secondary | ICD-10-CM

## 2019-01-01 DIAGNOSIS — M19072 Primary osteoarthritis, left ankle and foot: Secondary | ICD-10-CM

## 2019-01-01 DIAGNOSIS — M19071 Primary osteoarthritis, right ankle and foot: Secondary | ICD-10-CM

## 2019-01-01 DIAGNOSIS — J849 Interstitial pulmonary disease, unspecified: Secondary | ICD-10-CM

## 2019-01-01 DIAGNOSIS — I773 Arterial fibromuscular dysplasia: Secondary | ICD-10-CM

## 2019-01-01 DIAGNOSIS — Z79899 Other long term (current) drug therapy: Secondary | ICD-10-CM | POA: Diagnosis not present

## 2019-01-01 DIAGNOSIS — J479 Bronchiectasis, uncomplicated: Secondary | ICD-10-CM

## 2019-01-01 DIAGNOSIS — M19042 Primary osteoarthritis, left hand: Secondary | ICD-10-CM

## 2019-01-01 DIAGNOSIS — R5383 Other fatigue: Secondary | ICD-10-CM

## 2019-01-02 LAB — COMPLETE METABOLIC PANEL WITH GFR
AG Ratio: 1.3 (calc) (ref 1.0–2.5)
ALT: 13 U/L (ref 6–29)
AST: 18 U/L (ref 10–35)
Albumin: 4.2 g/dL (ref 3.6–5.1)
Alkaline phosphatase (APISO): 67 U/L (ref 37–153)
BUN: 12 mg/dL (ref 7–25)
CO2: 25 mmol/L (ref 20–32)
Calcium: 9.8 mg/dL (ref 8.6–10.4)
Chloride: 107 mmol/L (ref 98–110)
Creat: 0.76 mg/dL (ref 0.50–1.05)
GFR, Est African American: 104 mL/min/{1.73_m2} (ref >=60)
GFR, Est Non African American: 90 mL/min/{1.73_m2} (ref >=60)
Globulin: 3.2 g/dL (calc) (ref 1.9–3.7)
Glucose, Bld: 109 mg/dL — ABNORMAL HIGH (ref 65–99)
Potassium: 4.1 mmol/L (ref 3.5–5.3)
Sodium: 142 mmol/L (ref 135–146)
Total Bilirubin: 0.3 mg/dL (ref 0.2–1.2)
Total Protein: 7.4 g/dL (ref 6.1–8.1)

## 2019-01-02 LAB — CBC WITH DIFFERENTIAL/PLATELET
Absolute Monocytes: 584 cells/uL (ref 200–950)
Basophils Absolute: 37 cells/uL (ref 0–200)
Basophils Relative: 0.5 %
Eosinophils Absolute: 190 cells/uL (ref 15–500)
Eosinophils Relative: 2.6 %
HCT: 39.6 % (ref 35.0–45.0)
Hemoglobin: 13 g/dL (ref 11.7–15.5)
Lymphs Abs: 1234 cells/uL (ref 850–3900)
MCH: 27.8 pg (ref 27.0–33.0)
MCHC: 32.8 g/dL (ref 32.0–36.0)
MCV: 84.8 fL (ref 80.0–100.0)
MPV: 10.8 fL (ref 7.5–12.5)
Monocytes Relative: 8 %
Neutro Abs: 5256 cells/uL (ref 1500–7800)
Neutrophils Relative %: 72 %
Platelets: 291 10*3/uL (ref 140–400)
RBC: 4.67 10*6/uL (ref 3.80–5.10)
RDW: 12.4 % (ref 11.0–15.0)
Total Lymphocyte: 16.9 %
WBC: 7.3 10*3/uL (ref 3.8–10.8)

## 2019-01-02 LAB — VITAMIN D 25 HYDROXY (VIT D DEFICIENCY, FRACTURES): Vit D, 25-Hydroxy: 45 ng/mL (ref 30–100)

## 2019-01-02 NOTE — Progress Notes (Signed)
CBC WNL. Glucose is 109. Rest of CMP WNL.  Vitamin D is within desirable range.  Please recommend a maintenance dose of vitamin D.

## 2019-01-05 DIAGNOSIS — J471 Bronchiectasis with (acute) exacerbation: Secondary | ICD-10-CM | POA: Diagnosis not present

## 2019-01-05 DIAGNOSIS — G4733 Obstructive sleep apnea (adult) (pediatric): Secondary | ICD-10-CM | POA: Diagnosis not present

## 2019-01-06 ENCOUNTER — Ambulatory Visit (HOSPITAL_COMMUNITY): Payer: Federal, State, Local not specified - PPO

## 2019-01-08 ENCOUNTER — Ambulatory Visit (HOSPITAL_COMMUNITY): Payer: Federal, State, Local not specified - PPO

## 2019-01-13 ENCOUNTER — Encounter (INDEPENDENT_AMBULATORY_CARE_PROVIDER_SITE_OTHER): Payer: Self-pay | Admitting: Family Medicine

## 2019-01-13 ENCOUNTER — Ambulatory Visit (HOSPITAL_COMMUNITY): Payer: Federal, State, Local not specified - PPO

## 2019-01-13 ENCOUNTER — Ambulatory Visit (INDEPENDENT_AMBULATORY_CARE_PROVIDER_SITE_OTHER): Payer: Federal, State, Local not specified - PPO | Admitting: Family Medicine

## 2019-01-13 ENCOUNTER — Other Ambulatory Visit: Payer: Self-pay

## 2019-01-13 DIAGNOSIS — Z6841 Body Mass Index (BMI) 40.0 and over, adult: Secondary | ICD-10-CM

## 2019-01-13 DIAGNOSIS — E8881 Metabolic syndrome: Secondary | ICD-10-CM | POA: Diagnosis not present

## 2019-01-13 NOTE — Progress Notes (Signed)
Office: [phone]  /  Fax: [phone] TeleHealth Visit:  Jill Harper has verbally consented to this TeleHealth visit today. The patient is located at home, the provider is located at the [employer] [facility] office. The participants in this visit include the listed provider and patient. Clarissa was unable to use Webex today and the telehealth visit was conducted via telephone.  HPI:   Chief Complaint: OBESITY Jill Harper is here to discuss her progress with her obesity treatment plan. She is on the Category 2 plan and is following her eating plan approximately 98 % of the time. She states she is exercising 0 minutes 0 times per week. Jill Harper continues to work on [employer] loss and thinks that she has lost about 2 to 3 pounds in the last 2 to 3 weeks and she is happy about this. Jill Harper has done better following her Category 2 plan closely and her hunger is controlled.  We were unable to weigh the patient today for this TeleHealth visit. She feels as if she has lost weight since her last visit. She has lost 1 lb since starting treatment with [name].  Insulin Resistance Jill Harper has a diagnosis of insulin resistance based on her elevated fasting insulin level >5. Although Katyra's blood glucose readings are still under good control, insulin resistance puts her at greater risk of metabolic syndrome and diabetes. Jill Harper had a postprandial glucose of 109, which is not concerning as below 140 is normal. She is stable on metformin currently and continues to work on diet and exercise to decrease risk of diabetes. She notes decreased polyphagia.  ASSESSMENT AND PLAN:  Insulin resistance  Class 3 severe obesity with serious comorbidity and body mass index (BMI) of 40.0 to 44.9 in adult, unspecified obesity type ([place])  PLAN:  Insulin Resistance Jill Harper will continue to work on weight loss, exercise, and decreasing simple carbohydrates in her diet to help decrease the risk of diabetes. She was informed that eating too many  simple carbohydrates or too many calories at one sitting increases the likelihood of GI side effects. Jill Harper agreed to continue her diet and metformin 500 mg. We will check her labs at her next in-office visit. Jill Harper agreed to follow up with [name] as directed to monitor her progress in 2 weeks.  I spent > than 50% of the 25 minute visit on counseling as documented in the note.  Obesity Jill Harper is currently in the action stage of change. As such, her goal is to continue with weight loss efforts.  She has agreed to follow the Category 2 plan. Jill Harper has been instructed to work on exercised per her pulmonary rehabilitation provider. We discussed the following Behavioral Modification Strategies today: increasing lean protein intake, decreasing simple carbohydrates, and work on meal planning and easy cooking plans.  Jill Harper has agreed to follow up with our clinic in 2 weeks. She was informed of the importance of frequent follow up visits to maximize her success with intensive lifestyle modifications for her multiple health conditions.  ALLERGIES: Allergies  Allergen Reactions  . Sulfonamide Derivatives     REACTION: hives    MEDICATIONS: Current Outpatient Medications on File Prior to Visit  Medication Sig Dispense Refill  . albuterol (PROVENTIL HFA) 108 (90 Base) MCG/ACT inhaler Inhale 2 puffs into the lungs 4 (four) times daily as needed. Reported on 09/03/2015 1 Inhaler 4  . albuterol (PROVENTIL) (2.5 MG/3ML) 0.083% nebulizer solution Take 3 mLs (2.5 mg total) by nebulization every 6 (six) hours as needed  for wheezing or shortness of breath. 120 vial 5  . aspirin EC 81 MG tablet Take 81 mg by mouth daily.    [name] atenolol (TENORMIN) 25 MG tablet TAKE 1/2 TABLET BY MOUTH DAILY AS NEEDED FOR SYMPTOMS OF MITRAL PROLAPSE (Patient taking differently: Take 12.5 mg by mouth daily as needed (mitral prolapse). ) 45 tablet 1  . azaTHIOprine (IMURAN) 50 MG tablet TAKE 2 TABLETS(100 MG) BY MOUTH DAILY 60 tablet 2  .  eletriptan (RELPAX) 20 MG tablet One tablet by mouth at onset of headache. May repeat in 2 hours if headache persists or recurs. 10 tablet 0  . fluticasone furoate-vilanterol (BREO ELLIPTA) 100-25 MCG/INH AEPB Inhale 1 puff into the lungs daily. 60 each 5  . folic acid (FOLVITE) 1 MG tablet Take 2 tablets (2 mg total) by mouth daily. 180 tablet 3  . guaiFENesin (MUCINEX) 600 MG 12 hr tablet Take 2 tablets (1,200 mg total) by mouth 2 (two) times daily as needed for cough or to loosen phlegm. (Patient taking differently: Take 1,200 mg by mouth 2 (two) times daily. )    . metFORMIN (GLUCOPHAGE) 500 MG tablet Take 1 tablet (500 mg total) by mouth 2 (two) times daily with a meal. 60 tablet 0  . Multiple Vitamins-Minerals (MULTIVITAMIN GUMMIES ADULT PO) Take 2 each by mouth daily.    [name] Respiratory Therapy Supplies (FLUTTER) DEVI Use as directed (Patient taking differently: 2 (two) times daily. Use as directed) 1 each 0  . telmisartan (MICARDIS) 20 MG tablet Take 1 tablet (20 mg total) by mouth daily. 30 tablet 5   No current facility-administered medications on file prior to visit.     PAST MEDICAL HISTORY: Past Medical History:  Diagnosis Date  . Abnormal liver function test   . Bronchiectasis       . Dyspnea   . Fibromuscular dysplasia ([place])   . Fibromyalgia   . Heart murmur   . ILD (interstitial lung disease) ([place])   . Joint pain   . Menorrhagia   . MIGRAINE HEADACHE   . MVP (mitral valve prolapse)   . Osteoarthritis   . Polymyositis ([place])    Dr [name]; chronic MTX  . Pulmonary fibrosis ([place])   . Rheumatoid arthritis ([place])   . Sleep apnea   . Vitamin B 12 deficiency   . Vitamin D deficiency     PAST SURGICAL HISTORY: Past Surgical History:  Procedure Laterality Date  . ablation uterine  2010  . CARDIAC CATHETERIZATION  2002   normal  . DILATION AND CURETTAGE OF UTERUS  08-31-08   Dr [name]  . IR ANGIO INTRA EXTRACRAN SEL COM CAROTID INNOMINATE BILAT MOD SED   09/18/2018  . IR ANGIO VERTEBRAL SEL VERTEBRAL BILAT MOD SED  09/18/2018  . IR US GUIDE VASC ACCESS RIGHT  09/18/2018    SOCIAL HISTORY: Social History   Tobacco Use  . Smoking status: Never Smoker  . Smokeless tobacco: Never Used  . Tobacco comment: Married, lives with spouse. 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 Alcohol use: Never    Frequency: Never    Comment: maybe a wine cooler once every other year  . Drug use: Never    FAMILY HISTORY: Family History  Problem Relation Age of Onset  . Diabetes Mother   . Fibromyalgia Mother   . Ulcers Mother   . Heart failure Mother   . Thyroid disease Mother   .  Obesity Mother   . Multiple myeloma Father   . Hypertension Father   . Stroke Father   . Lupus Sister   . Other Sister        abdominal adhesions resulting in bowel obstruction  . Migraines Sister   . Heart disease Brother        bypass surgery  . Rheum arthritis Sister   . Multiple myeloma Sister   . Hypertension Sister   . Heart attack Sister   . Diabetes Sister   . Hypertension Sister   . Rheum arthritis Sister   . Diabetes Sister   . Diabetes Brother   . Headache Other        siblings with headaches but not diagnosed as migraines    ROS: Review of Systems  Gastrointestinal: Negative for nausea and vomiting.  Endo/Heme/Allergies:       Negative for hypoglycemia. Positive for polyphagia.    PHYSICAL EXAM: Pt in no acute distress  RECENT LABS AND TESTS: BMET    Component Value Date/Time   NA 142 01/01/2019 1535   NA 144 10/09/2018 1300   K 4.1 01/01/2019 1535   CL 107 01/01/2019 1535   CO2 25 01/01/2019 1535   GLUCOSE 109 (H) 01/01/2019 1535   BUN 12 01/01/2019 1535   BUN 7 10/09/2018 1300   CREATININE 0.76 01/01/2019 1535   CALCIUM 9.8 01/01/2019 1535   GFRNONAA 90 01/01/2019 1535   GFRAA 104 01/01/2019 1535   Lab Results  Component Value Date   HGBA1C 5.5 10/09/2018   HGBA1C 4.8  04/24/2016   Lab Results  Component Value Date   INSULIN 22.4 10/09/2018   CBC    Component Value Date/Time   WBC 7.3 01/01/2019 1535   RBC 4.67 01/01/2019 1535   HGB 13.0 01/01/2019 1535   HCT 39.6 01/01/2019 1535   PLT 291 01/01/2019 1535   MCV 84.8 01/01/2019 1535   MCH 27.8 01/01/2019 1535   MCHC 32.8 01/01/2019 1535   RDW 12.4 01/01/2019 1535   LYMPHSABS 1,234 01/01/2019 1535   MONOABS 666 01/31/2017 1006   EOSABS 190 01/01/2019 1535   BASOSABS 37 01/01/2019 1535   Iron/TIBC/Ferritin/ %Sat No results found for: IRON, TIBC, FERRITIN, IRONPCTSAT Lipid Panel     Component Value Date/Time   CHOL 141 10/09/2018 1300   TRIG 116 10/09/2018 1300   HDL 44 10/09/2018 1300   CHOLHDL 3 04/24/2016 0900   VLDL 11.8 04/24/2016 0900   LDLCALC 74 10/09/2018 1300   Hepatic Function Panel     Component Value Date/Time   PROT 7.4 01/01/2019 1535   PROT 7.8 10/09/2018 1300   ALBUMIN 4.6 10/09/2018 1300   AST 18 01/01/2019 1535   ALT 13 01/01/2019 1535   ALKPHOS 100 10/09/2018 1300   BILITOT 0.3 01/01/2019 1535   BILITOT 0.6 10/09/2018 1300   BILIDIR 0.1 04/03/2018 1457   IBILI 0.3 04/03/2018 1457      Component Value Date/Time   TSH 3.24 10/01/2018 1452   TSH 5.28 (H) 03/18/2018 1642   TSH 4.82 (H) 02/18/2018 1609    Results for VLASTA, BASKIN (MRN [id number]) as of 01/13/2019 16:50  Ref. Range 01/01/2019 15:35  Vitamin D, 25-Hydroxy Latest Ref Range: 30 - 100 ng/mL 45    I, Marcille Blanco, CMA, am acting as transcriptionist for Starlyn Skeans, MD I have reviewed the above documentation for accuracy and completeness, and I agree with the above. -Dennard Nip, MD

## 2019-01-15 ENCOUNTER — Ambulatory Visit (HOSPITAL_COMMUNITY): Payer: Federal, State, Local not specified - PPO

## 2019-01-15 NOTE — Telephone Encounter (Signed)
Judson Roch had a televisit with pt on 12/18/2018. She would like a note to her employer so she can continue to work from home. Please advise.    Patient Instructions by [name], NP at 12/18/2018 2:30 PM Author: [name], NP Author Type: Nurse Practitioner Filed: 12/18/2018 4:05 PM  Note Status: Addendum Cosign: Cosign Not Required Encounter Date: 12/18/2018  Editor: [name], NP (Nurse Practitioner)  Prior Versions: 1. [name], NP (Nurse Practitioner) at 12/18/2018 3:14 PM - Addendum   2. [name], NP (Nurse Practitioner) at 12/18/2018 3:13 PM - Addendum   3. [name], NP (Nurse Practitioner) at 12/18/2018 3:12 PM - Addendum   4. [name], NP (Nurse Practitioner) at 12/18/2018 3:11 PM - Addendum   5. [name], NP (Nurse Practitioner) at 12/18/2018 3:07 PM - Addendum   6. [name], NP (Nurse Practitioner) at 12/18/2018 3:04 PM - Signed    It is good to talk with you today I'm glad you are feeling better. Continue using your Flutter valve 4 puffs 2 times a day. Mucinex 1200 mg twice daily. ( With full glass of water). Delsym 2 tsp At bedtime As needed Cough .  Continue Breo daily Rinse mouth after use Continue Proventil as needed for breakthrough shortness of breath  Schedule for PFT's at office or at the hospital, whichever can do them first. HRCT chest without ILD protocol  to evaluate for progression of disease Follow up in 1 month with Tammy NP in the office  Continue wearing your oxygen at 2 L [place] at bedtime. Saturation goal is > 90% Please check oxygen level Twice daily ,call if <90%.  Please check temperature daily , call if >100.0  If your symptoms worsen or develop nausea/vomitting please call sooner for follow up or go to ER.   Would wear mask and goggles to work . Practice social distancing .  Please contact office for sooner follow up if symptoms do not improve or worsen or seek emergency care

## 2019-01-19 ENCOUNTER — Encounter: Payer: Self-pay | Admitting: Acute Care

## 2019-01-19 ENCOUNTER — Other Ambulatory Visit: Payer: Self-pay

## 2019-01-19 ENCOUNTER — Ambulatory Visit (INDEPENDENT_AMBULATORY_CARE_PROVIDER_SITE_OTHER): Payer: Federal, State, Local not specified - PPO | Admitting: Acute Care

## 2019-01-19 DIAGNOSIS — R05 Cough: Secondary | ICD-10-CM

## 2019-01-19 DIAGNOSIS — R0609 Other forms of dyspnea: Secondary | ICD-10-CM | POA: Diagnosis not present

## 2019-01-19 DIAGNOSIS — J479 Bronchiectasis, uncomplicated: Secondary | ICD-10-CM

## 2019-01-19 DIAGNOSIS — M3320 Polymyositis, organ involvement unspecified: Secondary | ICD-10-CM

## 2019-01-19 DIAGNOSIS — G4733 Obstructive sleep apnea (adult) (pediatric): Secondary | ICD-10-CM | POA: Diagnosis not present

## 2019-01-19 DIAGNOSIS — M069 Rheumatoid arthritis, unspecified: Secondary | ICD-10-CM

## 2019-01-19 DIAGNOSIS — R059 Cough, unspecified: Secondary | ICD-10-CM

## 2019-01-19 DIAGNOSIS — R0600 Dyspnea, unspecified: Secondary | ICD-10-CM

## 2019-01-19 NOTE — Assessment & Plan Note (Signed)
Dyspnea It is good to see you today. Continue your Breo daily One puff once daily Rinse mouth after use. Albuterol Rescue inhaler as needed for breakthrough shortness of breath or wheezinf. Albuterol nebs as needed Schedule PFT's to check for progression Please contact office for sooner follow up if symptoms do not improve or worsen or seek emergency care   Note for work that states it is to her advantage to continue to work from home if she cannot be 6 feet away for other employees , and have some kind of division or walls to enclose her to reduce her risk of exposure to  Covid.  Please contact office for sooner follow up if symptoms do not improve or worsen or seek emergency care  Follow up with [name]  In 4 months Please call sooner if you need [name].

## 2019-01-19 NOTE — Patient Instructions (Addendum)
Dyspnea It is good to see you today. Continue your Breo daily One puff once daily Rinse mouth after use. Albuterol Rescue inhaler as needed for breakthrough shortness of breath or wheezinf. Albuterol nebs as needed Schedule PFT's to check for progression  Bronchiectasis Continue using Flutter valve. Try to do this twice daily Continue Mucinex 1200 mg daily with a full glass of water.  Chronic cough Plan Sips of water instead of throat clearing Sugar Free Jolly Ranchers or Werther's originals for throat soothing. Delsym Cough syrup 5 cc's every 12 hours Non-sedating antihistamine of your choice daily ( Zyrtec, Allegra, Xyzol, Claritin ( Generic ok)  OSA We will send you for a mask fitting to determine the best mask option for you with the oxygen bleed in. Continue on CPAP at bedtime. You appear to be benefiting from the treatment  Goal is to wear for at least 6 hours each night for maximal clinical benefit. Continue to work on weight loss, as the link between excess weight  and sleep apnea is well established.   Remember to establish a good bedtime routine, and work on sleep hygiene.  Limit daytime naps , avoid stimulants such as caffeine and nicotine close to bedtime, exercise daily to promote sleep quality, avoid heavy spicy, fried , or rich foods before bed. Ensure adequate exposure to natural light during the day,establish a relaxing bedtime routine with a pleasant sleep environment ( Bedroom between 60 and 67 degrees, turn off bright lights , TV or device screens screens , consider black out curtains or white noise machines) Do not drive if sleepy. Remember to clean mask, tubing, filter, and reservoir once weekly with soapy water.     Rheumatoid arthritis ([place]) Continue Imuran Continue follow-up with rheumatology  Polymyositis [facility]) Continue Imuran Continue follow-up with rheumatology  Virtual Pulmonary Rehab once it is started  Continue to  wear mask and goggles.    Practice social distancing . Please contact office for sooner follow up if symptoms do not improve or worsen or seek emergency care   Note that states it is to her advantage to continue to work from home if she cannot be 6 feet away for other employees , and have some kind of division or walls to enclose her to reduce her risk of exposure to  Covid.  Please contact office for sooner follow up if symptoms do not improve or worsen or seek emergency care  Follow up with [name]  In 4 months Please call sooner if you need [name].

## 2019-01-19 NOTE — Progress Notes (Signed)
History of Present Illness Jill Harper is a 53 y.o. female never smoker with bronchiectasis , OSA on CPAP,  rheumatoid arthritis ,basilar predominant fibrotic,  ILD  with progression since 2017. She is recently been diagnosed with moderate to severe fibromuscular dysplasia on ASA 81mg daily. She is followed by [name].   Synopsis Patient is a 53 year old female never smoker followed by Dr.Soodfor bronchiectasis. She has multiple medical problems including rheumatoid arthritis ,basilar predominant fibrotic ILD  with progression since 2017. Findings are considered typical of usual interstitial pneumonia (UIP) pattern due to connective tissue Disease,  polymyositis and sleep apnea.  She is recently been diagnosed with moderate to severe fibromuscular dysplasia on ASA 81mg daily. She is followed by [name].  PMH: Rheumatoid arthritis, polymyositis, maintained on Imuran per rheumatology Smoker/ Smoking History: Never smoker Maintenance: Breo Ellipta 100 Albuterol Rescue inhaler Albuterol nebs as needed Flutter valve.   01/19/2019 Follow up Visit: Pt. Presents for follow up. She was seen virtually on 12/18/2018 after a flare of her bronchiectasis. At that time she was recovered, but was still experiencing shortness of breath with cough. Plan after  That visit was to Continue using your Flutter valve 4 puffs 2 times a day.Use Mucinex 1200 mg twice daily. ( With full glass of water), Add Delsym 2 tsp At bedtime As needed Cough , Continue Breo daily, Continue Proventil as needed for breakthrough shortness of breath ,  check oxygen level Twice daily ,call if <90%, and check temperature daily and call if > 100. In regard to her ILD, we planned to  Schedule for PFT's at office or at the hospital,HRCT chest without contrast ILD protocol  to evaluate for progression of disease,Continue wearing your oxygen at 2 L [place] at bedtime.Consider 6 minute walk at follow up, and resume pulmonary rehab once it  resumes. I explained that her saturation goal was > 90%. She states she has been doing well. She has not needed her nebulizer as much due to working from home. She has only rarely used her inhaler. She has been compliant with her CPAP every night. AHI is 1.4. She states she has been using her oxygen as bleed in for her CPAP.She states that she initially was doing well with the oxygen bled in, but now she feels she is not getting enough oxygen.She feels it is the mask ( Nasal pillows). She is compliant with her Breo, and her rescue nebs and inhaler. She states her dyspnea is 5 on a scale of 10. She is working from home. She has a cough, but she states she  does not have reflux. She states she is not on any PPI. She is throat clearing, but does not take allergy medications.  She has been contacted by Pulmonary REhab about doing a virtual exercises. She denies fever, chest pain, orthopnea or hemoptysis.   Tests: HST 07/28/18 >> AHI 5, SpO2 low 83%  Chest imaging: CT chest 03/23/05 >> patchy b/l lower lung ASD with cylindrical BTX CT chest 02/17/08 >> peripheral and basilar predominant subpleural GGO CT chest 12/01/08 >> no change HRCT chest 09/07/15 >> scattered GGO 04/15/2018-CT chest high-res- spectrum of findings compatible with basilar predominant fibrotic interstitial lung disease with progression since 2017, UIP  Pulmonarytests: PFT 10/30/11 >> FEV1 3.31 (114%), FEV1% 85, TLC 6.06 (108%), DLCO 77%, no BD PFT 11/18/12 >> FEV1 3.18 (111%), FEV1% 84, TLC 5.34 (95%), DLCO 67%, no BD PFT 12/02/15 >> FEV1 2.96 (108%), FEV1% 92, TLC 4.95 (86%), DLCO 65%, no  BD  04/17/2018-spirometry with DLCO-DLCO 76%  Serology 01/31/17 >> ANA negative, RF 29, anti CCP 213, SCL 70 negative, SSA/SSB negative, ENA SM negative, Jo-1 positive Quantiferon gold 01/31/17 >> negative  Cardiac tests: Echo 05/29/17 >> EF 60 to 65%, mod LVH, grade 1 DD Echo 05/23/18 >> mod LVH, EF 60 to 65%, grade 1 DD, mild MR   CBC  Latest Ref Rng & Units 01/01/2019 09/22/2018 09/18/2018  WBC 3.8 - 10.8 Thousand/uL 7.3 7.9 7.6  Hemoglobin 11.7 - 15.5 g/dL 13.0 14.5 13.8  Hematocrit 35.0 - 45.0 % 39.6 44.0 42.5  Platelets 140 - 400 Thousand/uL 291 313 292    BMP Latest Ref Rng & Units 01/01/2019 10/09/2018 09/22/2018  Glucose 65 - 99 mg/dL 109(H) 101(H) 84  BUN 7 - 25 mg/dL 12 7 9   Creatinine 0.50 - 1.05 mg/dL 0.76 0.82 0.74  BUN/Creat Ratio 6 - 22 (calc) NOT APPLICABLE 9 NOT APPLICABLE  Sodium 357 - 146 mmol/L 142 144 138  Potassium 3.5 - 5.3 mmol/L 4.1 4.5 4.3  Chloride 98 - 110 mmol/L 107 106 105  CO2 20 - 32 mmol/L 25 17(L) 25  Calcium 8.6 - 10.4 mg/dL 9.8 10.0 10.0    BNP No results found for: BNP  ProBNP    Component Value Date/Time   PROBNP 58.0 04/18/2009 1157    PFT    Component Value Date/Time   FEV1PRE 2.46 04/17/2018 1454   FEV1POST 2.96 12/02/2015 1156   FVCPRE 2.74 04/17/2018 1454   FVCPOST 3.24 12/02/2015 1156   TLC 4.95 12/02/2015 1156   DLCOUNC 20.26 04/17/2018 1454   PREFEV1FVCRT 90 04/17/2018 1454   PSTFEV1FVCRT 92 12/02/2015 1156    No results found.   Past medical hx Past Medical History:  Diagnosis Date  . Abnormal liver function test   . Bronchiectasis       . Dyspnea   . Fibromuscular dysplasia ([place])   . Fibromyalgia   . Heart murmur   . ILD (interstitial lung disease) ([place])   . Joint pain   . Menorrhagia   . MIGRAINE HEADACHE   . MVP (mitral valve prolapse)   . Osteoarthritis   . Polymyositis ([place])    Dr [name]; chronic MTX  . Pulmonary fibrosis ([place])   . Rheumatoid arthritis ([place])   . Sleep apnea   . Vitamin B 12 deficiency   . Vitamin D deficiency      Social History   Tobacco Use  . Smoking status: Never Smoker  . Smokeless tobacco: Never Used  . Tobacco comment: Married, lives with spouse. 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 Alcohol use: Never    Frequency: Never    Comment: maybe  a wine cooler once every other year  . Drug use: Never    Jill Harper reports that she has never smoked. She has never used smokeless tobacco. She reports that she does not drink alcohol or use drugs.  Tobacco Cessation: Never smoker  Past surgical hx, Family hx, Social hx all reviewed.  Current Outpatient Medications on File Prior to Visit  Medication Sig  . albuterol (PROVENTIL HFA) 108 (90 Base) MCG/ACT inhaler Inhale 2 puffs into the lungs 4 (four) times daily as needed. Reported on 09/03/2015  . albuterol (PROVENTIL) (2.5 MG/3ML) 0.083% nebulizer solution Take 3 mLs (2.5 mg total) by nebulization every 6 (six) hours as needed for wheezing or shortness of breath.  [name] aspirin EC 81 MG  tablet Take 81 mg by mouth daily.  [name] atenolol (TENORMIN) 25 MG tablet TAKE 1/2 TABLET BY MOUTH DAILY AS NEEDED FOR SYMPTOMS OF MITRAL PROLAPSE (Patient taking differently: Take 12.5 mg by mouth daily as needed (mitral prolapse). )  . azaTHIOprine (IMURAN) 50 MG tablet TAKE 2 TABLETS(100 MG) BY MOUTH DAILY  . eletriptan (RELPAX) 20 MG tablet One tablet by mouth at onset of headache. May repeat in 2 hours if headache persists or recurs.  . fluticasone furoate-vilanterol (BREO ELLIPTA) 100-25 MCG/INH AEPB Inhale 1 puff into the lungs daily.  . folic acid (FOLVITE) 1 MG tablet Take 2 tablets (2 mg total) by mouth daily.  [name] guaiFENesin (MUCINEX) 600 MG 12 hr tablet Take 2 tablets (1,200 mg total) by mouth 2 (two) times daily as needed for cough or to loosen phlegm. (Patient taking differently: Take 1,200 mg by mouth 2 (two) times daily. )  . metFORMIN (GLUCOPHAGE) 500 MG tablet Take 1 tablet (500 mg total) by mouth 2 (two) times daily with a meal.  . Multiple Vitamins-Minerals (MULTIVITAMIN GUMMIES ADULT PO) Take 2 each by mouth daily.  [name] Respiratory Therapy Supplies (FLUTTER) DEVI Use as directed (Patient taking differently: 2 (two) times daily. Use as directed)  . telmisartan (MICARDIS) 20 MG tablet Take 1 tablet  (20 mg total) by mouth daily.   No current facility-administered medications on file prior to visit.      Allergies  Allergen Reactions  . Sulfonamide Derivatives     REACTION: hives    Review Of Systems:  Constitutional:   No  weight loss, night sweats,  Fevers, chills, fatigue, or  lassitude.  HEENT:   No headaches,  Difficulty swallowing,  Tooth/dental problems, or  Sore throat,                No sneezing, itching, ear ache, nasal congestion, post nasal drip,   CV:  No chest pain,  Orthopnea, PND, swelling in lower extremities, anasarca, dizziness, palpitations, syncope.   GI  No heartburn, indigestion, abdominal pain, nausea, vomiting, diarrhea, change in bowel habits, loss of appetite, bloody stools.   Resp: + shortness of breath with exertion none at rest.  No excess mucus, + baseline  productive cough,  No non-productive cough,  No coughing up of blood.  No change in color of mucus.  No wheezing.  No chest wall deformity  Skin: no rash or lesions.  GU: no dysuria, change in color of urine, no urgency or frequency.  No flank pain, no hematuria   MS:  No joint pain or swelling.  No decreased range of motion.  No back pain.  Psych:  No change in mood or affect. No depression or anxiety.  No memory loss.   Vital Signs BP 138/90 (BP Location: Right Arm, Patient Position: Sitting, Cuff Size: Large)   Pulse 90   Temp 97.7 F (36.5 C)   Ht 5\' 8"  (1.727 m)   Wt 266 lb 6.4 oz (120.8 kg)   SpO2 99%   BMI 40.51 kg/m    Physical Exam:  General- No distress,  A&Ox3, pleasant ENT: No sinus tenderness, TM clear, pale nasal mucosa, no oral exudate,no post nasal drip, no LAN Cardiac: S1, S2, regular rate and rhythm, no murmur Chest: No wheeze/ rales/ dullness; no accessory muscle use, no nasal flaring, no sternal retractions Abd.: Soft Non-tender, ND, BS +, Body mass index is 40.51 kg/m.,I Ext: No clubbing cyanosis, edema Neuro:  normal strength, MAE x 4, A&O x 3  Skin: No  rashes,no lesions,  warm and dry Psych: normal mood and behavior   Assessment/Plan  Dyspnea Dyspnea It is good to see you today. Continue your Breo daily One puff once daily Rinse mouth after use. Albuterol Rescue inhaler as needed for breakthrough shortness of breath or wheezinf. Albuterol nebs as needed Schedule PFT's to check for progression Please contact office for sooner follow up if symptoms do not improve or worsen or seek emergency care   Note for work that states it is to her advantage to continue to work from home if she cannot be 6 feet away for other employees , and have some kind of division or walls to enclose her to reduce her risk of exposure to  Covid.  Please contact office for sooner follow up if symptoms do not improve or worsen or seek emergency care  Follow up with [name]  In 4 months Please call sooner if you need [name].   BRONCHIECTASIS Stable interval Plan Bronchiectasis Continue using Flutter valve. Try to do this twice daily Continue Mucinex 1200 mg daily with a full glass of water.     OSA (obstructive sleep apnea) 100% Compliant Average use 6 hours 33 minutes per night Feels she is not getting good O2 bleed in to the nasal pillows Thinks it is a mask issue AHI is 1.4 Plan: OSA We will send you for a mask fitting to determine the best mask option for you with the oxygen bleed in. Continue on CPAP at bedtime. You appear to be benefiting from the treatment  Goal is to wear for at least 6 hours each night for maximal clinical benefit. Continue to work on weight loss, as the link between excess weight  and sleep apnea is well established.   Remember to establish a good bedtime routine, and work on sleep hygiene.  Limit daytime naps , avoid stimulants such as caffeine and nicotine close to bedtime, exercise daily to promote sleep quality, avoid heavy spicy, fried , or rich foods before bed. Ensure adequate exposure to natural light during the  day,establish a relaxing bedtime routine with a pleasant sleep environment ( Bedroom between 60 and 67 degrees, turn off bright lights , TV or device screens screens , consider black out curtains or white noise machines) Do not drive if sleepy. Remember to clean mask, tubing, filter, and reservoir once weekly with soapy water.  Follow up ikn 4 months      Polymyositis ([place]) Conitnue Imuran Follow up with rheumatology  Rheumatoid arthritis ([place]) Continue Imuran Follow up with Rheumatology  Cough Sips of water instead of throat clearing Sugar Free Jolly Ranchers or Werther's originals for throat soothing. Delsym Cough syrup 5 cc's every 12 hours Non-sedating antihistamine of your choice daily ( Zyrtec, Allegra, [place] Regulus, Claritin ( Generic ok)    [name], NP 01/19/2019  5:53 PM

## 2019-01-19 NOTE — Assessment & Plan Note (Signed)
Stable interval Plan Bronchiectasis Continue using Flutter valve. Try to do this twice daily Continue Mucinex 1200 mg daily with a full glass of water.

## 2019-01-19 NOTE — Assessment & Plan Note (Signed)
Conitnue Imuran Follow up with rheumatology

## 2019-01-19 NOTE — Assessment & Plan Note (Signed)
Sips of water instead of throat clearing Sugar Free [employer] or Werther's originals for throat soothing. Delsym Cough syrup 5 cc's every 12 hours Non-sedating antihistamine of your choice daily ( Zyrtec, Allegra, Xyzol, Claritin ( Generic ok)

## 2019-01-19 NOTE — Assessment & Plan Note (Signed)
Continue Imuran Follow up with Rheumatology

## 2019-01-19 NOTE — Assessment & Plan Note (Signed)
100% Compliant Average use 6 hours 33 minutes per night Feels she is not getting good O2 bleed in to the nasal pillows Thinks it is a mask issue AHI is 1.4 Plan: OSA We will send you for a mask fitting to determine the best mask option for you with the oxygen bleed in. Continue on CPAP at bedtime. You appear to be benefiting from the treatment  Goal is to wear for at least 6 hours each night for maximal clinical benefit. Continue to work on weight loss, as the link between excess weight  and sleep apnea is well established.   Remember to establish a good bedtime routine, and work on sleep hygiene.  Limit daytime naps , avoid stimulants such as caffeine and nicotine close to bedtime, exercise daily to promote sleep quality, avoid heavy spicy, fried , or rich foods before bed. Ensure adequate exposure to natural light during the day,establish a relaxing bedtime routine with a pleasant sleep environment ( Bedroom between 60 and 67 degrees, turn off bright lights , TV or device screens screens , consider black out curtains or white noise machines) Do not drive if sleepy. Remember to clean mask, tubing, filter, and reservoir once weekly with soapy water.  Follow up ikn 4 months

## 2019-01-20 ENCOUNTER — Ambulatory Visit (HOSPITAL_COMMUNITY): Payer: Federal, State, Local not specified - PPO

## 2019-01-20 ENCOUNTER — Telehealth: Payer: Self-pay

## 2019-01-20 NOTE — Addendum Note (Signed)
Addended by: Spero Curb on: 01/20/2019 09:35 AM   Modules accepted: Orders

## 2019-01-22 ENCOUNTER — Ambulatory Visit (HOSPITAL_COMMUNITY): Payer: Federal, State, Local not specified - PPO

## 2019-01-27 ENCOUNTER — Ambulatory Visit (INDEPENDENT_AMBULATORY_CARE_PROVIDER_SITE_OTHER): Payer: Federal, State, Local not specified - PPO | Admitting: Family Medicine

## 2019-01-27 ENCOUNTER — Encounter (INDEPENDENT_AMBULATORY_CARE_PROVIDER_SITE_OTHER): Payer: Self-pay | Admitting: Family Medicine

## 2019-01-27 ENCOUNTER — Other Ambulatory Visit: Payer: Self-pay

## 2019-01-27 DIAGNOSIS — Z6841 Body Mass Index (BMI) 40.0 and over, adult: Secondary | ICD-10-CM | POA: Diagnosis not present

## 2019-01-27 DIAGNOSIS — G4733 Obstructive sleep apnea (adult) (pediatric): Secondary | ICD-10-CM | POA: Diagnosis not present

## 2019-01-27 DIAGNOSIS — E8881 Metabolic syndrome: Secondary | ICD-10-CM | POA: Diagnosis not present

## 2019-01-27 DIAGNOSIS — J471 Bronchiectasis with (acute) exacerbation: Secondary | ICD-10-CM | POA: Diagnosis not present

## 2019-01-27 MED ORDER — METFORMIN HCL 500 MG PO TABS: 500.0000 mg | ORAL_TABLET | Freq: Two times a day (BID) | ORAL | 0 refills | Status: DC

## 2019-01-28 NOTE — Progress Notes (Signed)
 Office: [phone]  /  Fax: [phone] TeleHealth Visit:  Jill Harper has verbally consented to this TeleHealth visit today. The patient is located at home, the provider is located at the [facility] office. The participants in this visit include the listed provider and patient. The visit was conducted today via Webex.  HPI:   Chief Complaint: OBESITY Jill Harper is here to discuss her progress with her obesity treatment plan. She is on the Category 2 plan and is following her eating plan approximately 98% of the time. She states she is walking in 5 minute increments 3 times per day 2 times per week. Jill Harper is doing well following her Category 2 plan and feels she has lost another 2 lbs since her last visit. She is trying to do a little exercise but due to her ILD she cannot exercise unless someone else is with her to monitor her.  We were unable to weigh the patient today for this TeleHealth visit. She feels as if she has lost 2 lbs since her last visit. She has lost 1 lb since starting treatment with us.  Insulin Resistance Jill Harper has a diagnosis of insulin resistance based on her elevated fasting insulin level >5. Although Jill Harper's blood glucose readings are still under good control, insulin resistance puts her at greater risk of metabolic syndrome and diabetes. Her last A1c was stable at 5.5 on 10/09/2018 on metformin and diet. She is doing well overall with weight loss and continues to work on diet and exercise to decrease risk of diabetes. No nausea, vomiting, or hypoglycemia.  ASSESSMENT AND PLAN:  Insulin resistance - Plan: metFORMIN (GLUCOPHAGE) 500 MG tablet  PLAN:  Insulin Resistance Jill Harper will continue to work on weight loss, exercise, and decreasing simple carbohydrates in her diet to help decrease the risk of diabetes. We dicussed metformin including benefits and risks. She was informed that eating too many simple carbohydrates or too many calories at one sitting  increases the likelihood of GI side effects. Jill Harper was given a refill on her metformin 500 mg 2 times daily with meals #60 with 0 refills. She agrees to follow-up with our clinic in 2 weeks.  Obesity Jill Harper is currently in the action stage of change. As such, her goal is to continue with weight loss efforts. She has agreed to follow the Category 2 plan. Jill Harper has been instructed to start exercise bands for 10 minutes per day for weight loss and overall health benefits. We discussed the following Behavioral Modification Strategies today: increasing lean protein intake, decreasing simple carbohydrates, work on meal planning and easy cooking plans.  Jill Harper has agreed to follow-up with our clinic in 2 weeks. She was informed of the importance of frequent follow-up visits to maximize her success with intensive lifestyle modifications for her multiple health conditions.  ALLERGIES: Allergies  Allergen Reactions  . Sulfonamide Derivatives     REACTION: hives    MEDICATIONS: Current Outpatient Medications on File Prior to Visit  Medication Sig Dispense Refill  . albuterol (PROVENTIL HFA) 108 (90 Base) MCG/ACT inhaler Inhale 2 puffs into the lungs 4 (four) times daily as needed. Reported on 09/03/2015 1 Inhaler 4  . albuterol (PROVENTIL) (2.5 MG/3ML) 0.083% nebulizer solution Take 3 mLs (2.5 mg total) by nebulization every 6 (six) hours as needed for wheezing or shortness of breath. 120 vial 5  . aspirin EC 81 MG tablet Take 81 mg by mouth daily.    . atenolol (TENORMIN) 25 MG tablet TAKE 1/2   TABLET BY MOUTH DAILY AS NEEDED FOR SYMPTOMS OF MITRAL PROLAPSE (Patient taking differently: Take 12.5 mg by mouth daily as needed (mitral prolapse). ) 45 tablet 1  . azaTHIOprine (IMURAN) 50 MG tablet TAKE 2 TABLETS(100 MG) BY MOUTH DAILY 60 tablet 2  . eletriptan (RELPAX) 20 MG tablet One tablet by mouth at onset of headache. May repeat in 2 hours if headache persists or recurs. 10 tablet 0  . fluticasone  furoate-vilanterol (BREO ELLIPTA) 100-25 MCG/INH AEPB Inhale 1 puff into the lungs daily. 60 each 5  . folic acid (FOLVITE) 1 MG tablet Take 2 tablets (2 mg total) by mouth daily. 180 tablet 3  . guaiFENesin (MUCINEX) 600 MG 12 hr tablet Take 2 tablets (1,200 mg total) by mouth 2 (two) times daily as needed for cough or to loosen phlegm. (Patient taking differently: Take 1,200 mg by mouth 2 (two) times daily. )    . Multiple Vitamins-Minerals (MULTIVITAMIN GUMMIES ADULT PO) Take 2 each by mouth daily.    [name] Respiratory Therapy Supplies (FLUTTER) DEVI Use as directed (Patient taking differently: 2 (two) times daily. Use as directed) 1 each 0  . telmisartan (MICARDIS) 20 MG tablet Take 1 tablet (20 mg total) by mouth daily. 30 tablet 5   No current facility-administered medications on file prior to visit.     PAST MEDICAL HISTORY: Past Medical History:  Diagnosis Date  . Abnormal liver function test   . Bronchiectasis       . Dyspnea   . Fibromuscular dysplasia ([place])   . Fibromyalgia   . Heart murmur   . ILD (interstitial lung disease) ([place])   . Joint pain   . Menorrhagia   . MIGRAINE HEADACHE   . MVP (mitral valve prolapse)   . Osteoarthritis   . Polymyositis ([place])    Dr [name]; chronic MTX  . Pulmonary fibrosis ([place])   . Rheumatoid arthritis ([place])   . Sleep apnea   . Vitamin B 12 deficiency   . Vitamin D deficiency     PAST SURGICAL HISTORY: Past Surgical History:  Procedure Laterality Date  . ablation uterine  2010  . CARDIAC CATHETERIZATION  2002   normal  . DILATION AND CURETTAGE OF UTERUS  08-31-08   Dr [name]  . IR ANGIO INTRA EXTRACRAN SEL COM CAROTID INNOMINATE BILAT MOD SED  09/18/2018  . IR ANGIO VERTEBRAL SEL VERTEBRAL BILAT MOD SED  09/18/2018  . IR US GUIDE VASC ACCESS RIGHT  09/18/2018    SOCIAL HISTORY: Social History   Tobacco Use  . Smoking status: Never Smoker  . Smokeless tobacco: Never Used  . Tobacco comment: Married, lives with  spouse. 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 Alcohol use: Never    Frequency: Never    Comment: maybe a wine cooler once every other year  . Drug use: Never    FAMILY HISTORY: Family History  Problem Relation Age of Onset  . Diabetes Mother   . Fibromyalgia Mother   . Ulcers Mother   . Heart failure Mother   . Thyroid disease Mother   . Obesity Mother   . Multiple myeloma Father   . Hypertension Father   . Stroke Father   . Lupus Sister   . Other Sister        abdominal adhesions resulting in bowel obstruction  . Migraines Sister   . Heart disease Brother  bypass surgery  . Rheum arthritis Sister   . Multiple myeloma Sister   . Hypertension Sister   . Heart attack Sister   . Diabetes Sister   . Hypertension Sister   . Rheum arthritis Sister   . Diabetes Sister   . Diabetes Brother   . Headache Other        siblings with headaches but not diagnosed as migraines   ROS: Review of Systems  Gastrointestinal: Negative for nausea and vomiting.  Endo/Heme/Allergies:       Negative for hypoglycemia.   PHYSICAL EXAM: Pt in no acute distress  RECENT LABS AND TESTS: BMET    Component Value Date/Time   NA 142 01/01/2019 1535   NA 144 10/09/2018 1300   K 4.1 01/01/2019 1535   CL 107 01/01/2019 1535   CO2 25 01/01/2019 1535   GLUCOSE 109 (H) 01/01/2019 1535   BUN 12 01/01/2019 1535   BUN 7 10/09/2018 1300   CREATININE 0.76 01/01/2019 1535   CALCIUM 9.8 01/01/2019 1535   GFRNONAA 90 01/01/2019 1535   GFRAA 104 01/01/2019 1535   Lab Results  Component Value Date   HGBA1C 5.5 10/09/2018   HGBA1C 4.8 04/24/2016   Lab Results  Component Value Date   INSULIN 22.4 10/09/2018   CBC    Component Value Date/Time   WBC 7.3 01/01/2019 1535   RBC 4.67 01/01/2019 1535   HGB 13.0 01/01/2019 1535   HCT 39.6 01/01/2019 1535   PLT 291 01/01/2019 1535   MCV 84.8 01/01/2019 1535   MCH 27.8 01/01/2019 1535    MCHC 32.8 01/01/2019 1535   RDW 12.4 01/01/2019 1535   LYMPHSABS 1,234 01/01/2019 1535   MONOABS 666 01/31/2017 1006   EOSABS 190 01/01/2019 1535   BASOSABS 37 01/01/2019 1535   Iron/TIBC/Ferritin/ %Sat No results found for: IRON, TIBC, FERRITIN, IRONPCTSAT Lipid Panel     Component Value Date/Time   CHOL 141 10/09/2018 1300   TRIG 116 10/09/2018 1300   HDL 44 10/09/2018 1300   CHOLHDL 3 04/24/2016 0900   VLDL 11.8 04/24/2016 0900   LDLCALC 74 10/09/2018 1300   Hepatic Function Panel     Component Value Date/Time   PROT 7.4 01/01/2019 1535   PROT 7.8 10/09/2018 1300   ALBUMIN 4.6 10/09/2018 1300   AST 18 01/01/2019 1535   ALT 13 01/01/2019 1535   ALKPHOS 100 10/09/2018 1300   BILITOT 0.3 01/01/2019 1535   BILITOT 0.6 10/09/2018 1300   BILIDIR 0.1 04/03/2018 1457   IBILI 0.3 04/03/2018 1457      Component Value Date/Time   TSH 3.24 10/01/2018 1452   TSH 5.28 (H) 03/18/2018 1642   TSH 4.82 (H) 02/18/2018 1609   Results for Jill Harper, Casady F (MRN [id number]) as of 01/28/2019 11:47  Ref. Range 01/01/2019 15:35  Vitamin D, 25-Hydroxy Latest Ref Range: 30 - 100 ng/mL 45    I, Denise Haag, am acting as transcriptionist for Caren Beasley, MD I have reviewed the above documentation for accuracy and completeness, and I agree with the above. -Caren Beasley, MD   

## 2019-02-01 DIAGNOSIS — M0579 Rheumatoid arthritis with rheumatoid factor of multiple sites without organ or systems involvement: Secondary | ICD-10-CM | POA: Diagnosis not present

## 2019-02-01 DIAGNOSIS — Z79899 Other long term (current) drug therapy: Secondary | ICD-10-CM | POA: Diagnosis not present

## 2019-02-01 DIAGNOSIS — M3320 Polymyositis, organ involvement unspecified: Secondary | ICD-10-CM | POA: Diagnosis not present

## 2019-02-01 DIAGNOSIS — R768 Other specified abnormal immunological findings in serum: Secondary | ICD-10-CM | POA: Diagnosis not present

## 2019-02-02 ENCOUNTER — Other Ambulatory Visit (HOSPITAL_COMMUNITY)
Admission: RE | Admit: 2019-02-02 | Discharge: 2019-02-02 | Disposition: A | Discharge status: Home / Self Care | Payer: Federal, State, Local not specified - PPO | Source: Ambulatory Visit | Attending: Internal Medicine | Admitting: Internal Medicine

## 2019-02-02 DIAGNOSIS — Z1159 Encounter for screening for other viral diseases: Secondary | ICD-10-CM | POA: Diagnosis not present

## 2019-02-02 DIAGNOSIS — Z01812 Encounter for preprocedural laboratory examination: Secondary | ICD-10-CM | POA: Insufficient documentation

## 2019-02-02 LAB — SARS CORONAVIRUS 2 (TAT 6-24 HRS): SARS Coronavirus 2: NEGATIVE

## 2019-02-04 ENCOUNTER — Other Ambulatory Visit: Payer: Self-pay | Admitting: Acute Care

## 2019-02-04 DIAGNOSIS — G4733 Obstructive sleep apnea (adult) (pediatric): Secondary | ICD-10-CM | POA: Diagnosis not present

## 2019-02-04 DIAGNOSIS — J471 Bronchiectasis with (acute) exacerbation: Secondary | ICD-10-CM | POA: Diagnosis not present

## 2019-02-05 ENCOUNTER — Ambulatory Visit (HOSPITAL_BASED_OUTPATIENT_CLINIC_OR_DEPARTMENT_OTHER): Payer: Federal, State, Local not specified - PPO | Attending: Acute Care | Admitting: Radiology

## 2019-02-05 ENCOUNTER — Ambulatory Visit (INDEPENDENT_AMBULATORY_CARE_PROVIDER_SITE_OTHER): Payer: Federal, State, Local not specified - PPO | Admitting: Pulmonary Disease

## 2019-02-05 ENCOUNTER — Other Ambulatory Visit: Payer: Self-pay

## 2019-02-05 DIAGNOSIS — G4733 Obstructive sleep apnea (adult) (pediatric): Secondary | ICD-10-CM

## 2019-02-05 DIAGNOSIS — J479 Bronchiectasis, uncomplicated: Secondary | ICD-10-CM

## 2019-02-05 LAB — PULMONARY FUNCTION TEST
DL/VA % pred: 120 %
DL/VA: 4.99 ml/min/mmHg/L
DLCO cor % pred: 82 %
DLCO cor: 19.68 ml/min/mmHg
DLCO unc % pred: 81 %
DLCO unc: 19.43 ml/min/mmHg
FEF 25-75 Post: 3.65 L/sec
FEF 25-75 Pre: 3.28 L/sec
FEF2575-%Change-Post: 11 %
FEF2575-%Pred-Post: 138 %
FEF2575-%Pred-Pre: 124 %
FEV1-%Change-Post: -4 %
FEV1-%Pred-Post: 84 %
FEV1-%Pred-Pre: 87 %
FEV1-Post: 2.21 L
FEV1-Pre: 2.3 L
FEV1FVC-%Change-Post: -3 %
FEV1FVC-%Pred-Pre: 110 %
FEV6-%Change-Post: 0 %
FEV6-%Pred-Post: 79 %
FEV6-%Pred-Pre: 80 %
FEV6-Post: 2.56 L
FEV6-Pre: 2.58 L
FEV6FVC-%Change-Post: 0 %
FEV6FVC-%Pred-Post: 102 %
FEV6FVC-%Pred-Pre: 102 %
FVC-%Change-Post: 0 %
FVC-%Pred-Post: 77 %
FVC-%Pred-Pre: 78 %
FVC-Post: 2.56 L
FVC-Pre: 2.58 L
Post FEV1/FVC ratio: 86 %
Post FEV6/FVC ratio: 100 %
Pre FEV1/FVC ratio: 89 %
Pre FEV6/FVC Ratio: 100 %
RV % pred: 69 %
RV: 1.43 L
TLC % pred: 74 %
TLC: 4.21 L

## 2019-02-05 NOTE — Progress Notes (Signed)
Full PFT performed today. Pt had a lot of cough.

## 2019-02-12 ENCOUNTER — Other Ambulatory Visit: Payer: Self-pay

## 2019-02-12 ENCOUNTER — Telehealth (INDEPENDENT_AMBULATORY_CARE_PROVIDER_SITE_OTHER): Payer: Federal, State, Local not specified - PPO | Admitting: Family Medicine

## 2019-02-12 ENCOUNTER — Encounter (INDEPENDENT_AMBULATORY_CARE_PROVIDER_SITE_OTHER): Payer: Self-pay | Admitting: Family Medicine

## 2019-02-12 DIAGNOSIS — R600 Localized edema: Secondary | ICD-10-CM | POA: Diagnosis not present

## 2019-02-12 DIAGNOSIS — Z6841 Body Mass Index (BMI) 40.0 and over, adult: Secondary | ICD-10-CM | POA: Diagnosis not present

## 2019-02-20 ENCOUNTER — Telehealth (HOSPITAL_COMMUNITY): Payer: Self-pay

## 2019-02-24 ENCOUNTER — Telehealth: Payer: Self-pay | Admitting: Pulmonary Disease

## 2019-02-24 NOTE — Telephone Encounter (Signed)
PFT 02/05/19 >> FEV1 2.30 (87%), FEV1% 89, TLC 4.21 (74%), DLCO 81%, no BD   Please let her know her PFT looked good.  No change from previous PFT in 2019.  No change to current treatment plan.

## 2019-02-24 NOTE — Telephone Encounter (Signed)
Attempted to call pt but unable to reach. Left message for pt to return call. 

## 2019-02-25 NOTE — Telephone Encounter (Signed)
Called and spoke with pt letting her know the results of the PFT and that VS stated it looked good. Pt expressed understanding but was really confused by the fact that VS said it looked good as she felt like she had a hard time doing the test and she also stated that they had a hard time getting the results. Pt wanted to be scheduled for a f/u with VS in August/first avail to fully discuss results in detail. appt has been scheduled for pt with VS 8/12. Nothing further needed.

## 2019-02-25 NOTE — Telephone Encounter (Signed)
Pt returning phone call to get results [phone]

## 2019-02-26 ENCOUNTER — Other Ambulatory Visit: Payer: Self-pay | Admitting: Physician Assistant

## 2019-02-26 DIAGNOSIS — G4733 Obstructive sleep apnea (adult) (pediatric): Secondary | ICD-10-CM | POA: Diagnosis not present

## 2019-02-26 DIAGNOSIS — J471 Bronchiectasis with (acute) exacerbation: Secondary | ICD-10-CM | POA: Diagnosis not present

## 2019-02-26 NOTE — Telephone Encounter (Signed)
Last Visit: 01/01/19  Next Visit: 05/05/19  Okay to refill per Dr. Estanislado Pandy

## 2019-03-02 ENCOUNTER — Ambulatory Visit (INDEPENDENT_AMBULATORY_CARE_PROVIDER_SITE_OTHER): Payer: Federal, State, Local not specified - PPO | Admitting: Family Medicine

## 2019-03-04 ENCOUNTER — Telehealth (INDEPENDENT_AMBULATORY_CARE_PROVIDER_SITE_OTHER): Payer: Federal, State, Local not specified - PPO | Admitting: Family Medicine

## 2019-03-04 ENCOUNTER — Encounter (INDEPENDENT_AMBULATORY_CARE_PROVIDER_SITE_OTHER): Payer: Self-pay | Admitting: Family Medicine

## 2019-03-04 ENCOUNTER — Other Ambulatory Visit: Payer: Self-pay

## 2019-03-04 DIAGNOSIS — Z6841 Body Mass Index (BMI) 40.0 and over, adult: Secondary | ICD-10-CM

## 2019-03-04 DIAGNOSIS — E8881 Metabolic syndrome: Secondary | ICD-10-CM | POA: Diagnosis not present

## 2019-03-04 MED ORDER — METFORMIN HCL 500 MG PO TABS: 500.0000 mg | ORAL_TABLET | Freq: Two times a day (BID) | ORAL | 0 refills | Status: DC

## 2019-03-05 NOTE — Progress Notes (Signed)
Office: [phone]  /  Fax: [phone] TeleHealth Visit:  Jill Harper has verbally consented to this TeleHealth visit today. The patient is located at home, the provider is located at the [facility] office. The participants in this visit include the listed provider and patient and any and all parties involved. The visit was conducted today via WebEx.  HPI:   Chief Complaint: OBESITY Jill Harper is here to discuss her progress with her obesity treatment plan. She is on the Category 2 plan and is following her eating plan approximately 98 % of the time. She states she is walking and doing stretches 20 minutes 3 to 4 times per week. Jill Harper feels she has done well with her Category 2 plan, and weight loss. Her weight today was 263 pounds. She states she is doing well with meal planning and prepping, and her hunger is controlled overall. Jill Harper sometimes isn't eating all her protein. We were unable to weigh the patient today for this TeleHealth visit. She feels as if she has lost weight since her last visit. She has lost 7 lbs since starting treatment with [name].  Insulin Resistance Jill Harper has a diagnosis of insulin resistance based on her elevated fasting insulin level >5. Although Consuella's blood glucose readings are still under good control, insulin resistance puts her at greater risk of metabolic syndrome and diabetes. Tikita is stable on metformin, and she denies nausea, vomiting or hypoglycemia. Jill Harper is doing well on her eating plan and she continues to lose weight. Jill Harper continues to work on diet and exercise to decrease risk of diabetes.  ASSESSMENT AND PLAN:  Class 3 severe obesity with serious comorbidity and body mass index (BMI) of 40.0 to 44.9 in adult, unspecified obesity type (HCC)  Insulin resistance - Plan: metFORMIN (GLUCOPHAGE) 500 MG tablet  PLAN:  Insulin Resistance Jill Harper will continue to work on weight loss, exercise, and decreasing simple carbohydrates in her diet to help  decrease the risk of diabetes. We dicussed metformin including benefits and risks. She was informed that eating too many simple carbohydrates or too many calories at one sitting increases the likelihood of GI side effects. Jill Harper agrees to continue metformin 500 mg two times daily #60 with no refills and follow up with [name] as directed to monitor her progress.  Obesity Jill Harper is currently in the action stage of change. As such, her goal is to continue with weight loss efforts She has agreed to follow the Category 2 plan Jill Harper has been instructed to work up to a goal of 150 minutes of combined cardio and strengthening exercise per week for weight loss and overall health benefits. We discussed the following Behavioral Modification Strategies today: increasing lean protein intake  Jill Harper has agreed to follow up with our clinic in 2 weeks. She was informed of the importance of frequent follow up visits to maximize her success with intensive lifestyle modifications for her multiple health conditions.  ALLERGIES: Allergies  Allergen Reactions  . Sulfonamide Derivatives     REACTION: hives    MEDICATIONS: Current Outpatient Medications on File Prior to Visit  Medication Sig Dispense Refill  . albuterol (PROVENTIL HFA) 108 (90 Base) MCG/ACT inhaler Inhale 2 puffs into the lungs 4 (four) times daily as needed. Reported on 09/03/2015 1 Inhaler 4  . albuterol (PROVENTIL) (2.5 MG/3ML) 0.083% nebulizer solution Take 3 mLs (2.5 mg total) by nebulization every 6 (six) hours as needed for wheezing or shortness of breath. 120 vial 5  . aspirin EC  81 MG tablet Take 81 mg by mouth daily.    [name] atenolol (TENORMIN) 25 MG tablet TAKE 1/2 TABLET BY MOUTH DAILY AS NEEDED FOR SYMPTOMS OF MITRAL PROLAPSE (Patient taking differently: Take 12.5 mg by mouth daily as needed (mitral prolapse). ) 45 tablet 1  . azaTHIOprine (IMURAN) 50 MG tablet TAKE 2 TABLETS(100 MG) BY MOUTH DAILY 60 tablet 2  . BREO ELLIPTA 100-25 MCG/INH AEPB  INHALE 1 PUFF INTO THE LUNGS DAILY 60 each 5  . eletriptan (RELPAX) 20 MG tablet One tablet by mouth at onset of headache. May repeat in 2 hours if headache persists or recurs. 10 tablet 0  . folic acid (FOLVITE) 1 MG tablet TAKE 2 TABLETS(2 MG) BY MOUTH DAILY 180 tablet 3  . guaiFENesin (MUCINEX) 600 MG 12 hr tablet Take 2 tablets (1,200 mg total) by mouth 2 (two) times daily as needed for cough or to loosen phlegm. (Patient taking differently: Take 1,200 mg by mouth 2 (two) times daily. )    . Multiple Vitamins-Minerals (MULTIVITAMIN GUMMIES ADULT PO) Take 2 each by mouth daily.    [name] Respiratory Therapy Supplies (FLUTTER) DEVI Use as directed (Patient taking differently: 2 (two) times daily. Use as directed) 1 each 0  . telmisartan (MICARDIS) 20 MG tablet Take 1 tablet (20 mg total) by mouth daily. 30 tablet 5   No current facility-administered medications on file prior to visit.     PAST MEDICAL HISTORY: Past Medical History:  Diagnosis Date  . Abnormal liver function test   . Bronchiectasis       . Dyspnea   . Fibromuscular dysplasia ([place])   . Fibromyalgia   . Heart murmur   . ILD (interstitial lung disease) ([place])   . Joint pain   . Menorrhagia   . MIGRAINE HEADACHE   . MVP (mitral valve prolapse)   . Osteoarthritis   . Polymyositis ([place])    Dr [name]; chronic MTX  . Pulmonary fibrosis ([place])   . Rheumatoid arthritis ([place])   . Sleep apnea   . Vitamin B 12 deficiency   . Vitamin D deficiency     PAST SURGICAL HISTORY: Past Surgical History:  Procedure Laterality Date  . ablation uterine  2010  . CARDIAC CATHETERIZATION  2002   normal  . DILATION AND CURETTAGE OF UTERUS  08-31-08   Dr [name]  . IR ANGIO INTRA EXTRACRAN SEL COM CAROTID INNOMINATE BILAT MOD SED  09/18/2018  . IR ANGIO VERTEBRAL SEL VERTEBRAL BILAT MOD SED  09/18/2018  . IR US GUIDE VASC ACCESS RIGHT  09/18/2018    SOCIAL HISTORY: Social History   Tobacco Use  . Smoking status: Never  Smoker  . Smokeless tobacco: Never Used  . Tobacco comment: Married, lives with spouse. 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 Alcohol use: Never    Frequency: Never    Comment: maybe a wine cooler once every other year  . Drug use: Never    FAMILY HISTORY: Family History  Problem Relation Age of Onset  . Diabetes Mother   . Fibromyalgia Mother   . Ulcers Mother   . Heart failure Mother   . Thyroid disease Mother   . Obesity Mother   . Multiple myeloma Father   . Hypertension Father   . Stroke Father   . Lupus Sister   . Other Sister        abdominal adhesions resulting in bowel  obstruction  . Migraines Sister   . Heart disease Brother        bypass surgery  . Rheum arthritis Sister   . Multiple myeloma Sister   . Hypertension Sister   . Heart attack Sister   . Diabetes Sister   . Hypertension Sister   . Rheum arthritis Sister   . Diabetes Sister   . Diabetes Brother   . Headache Other        siblings with headaches but not diagnosed as migraines    ROS: Review of Systems  Constitutional: Positive for weight loss.  Gastrointestinal: Negative for nausea and vomiting.  Endo/Heme/Allergies:       Negative for hypoglycemia    PHYSICAL EXAM: Pt in no acute distress  RECENT LABS AND TESTS: BMET    Component Value Date/Time   NA 142 01/01/2019 1535   NA 144 10/09/2018 1300   K 4.1 01/01/2019 1535   CL 107 01/01/2019 1535   CO2 25 01/01/2019 1535   GLUCOSE 109 (H) 01/01/2019 1535   BUN 12 01/01/2019 1535   BUN 7 10/09/2018 1300   CREATININE 0.76 01/01/2019 1535   CALCIUM 9.8 01/01/2019 1535   GFRNONAA 90 01/01/2019 1535   GFRAA 104 01/01/2019 1535   Lab Results  Component Value Date   HGBA1C 5.5 10/09/2018   HGBA1C 4.8 04/24/2016   Lab Results  Component Value Date   INSULIN 22.4 10/09/2018   CBC    Component Value Date/Time   WBC 7.3 01/01/2019 1535   RBC 4.67 01/01/2019 1535   HGB 13.0  01/01/2019 1535   HCT 39.6 01/01/2019 1535   PLT 291 01/01/2019 1535   MCV 84.8 01/01/2019 1535   MCH 27.8 01/01/2019 1535   MCHC 32.8 01/01/2019 1535   RDW 12.4 01/01/2019 1535   LYMPHSABS 1,234 01/01/2019 1535   MONOABS 666 01/31/2017 1006   EOSABS 190 01/01/2019 1535   BASOSABS 37 01/01/2019 1535   Iron/TIBC/Ferritin/ %Sat No results found for: IRON, TIBC, FERRITIN, IRONPCTSAT Lipid Panel     Component Value Date/Time   CHOL 141 10/09/2018 1300   TRIG 116 10/09/2018 1300   HDL 44 10/09/2018 1300   CHOLHDL 3 04/24/2016 0900   VLDL 11.8 04/24/2016 0900   LDLCALC 74 10/09/2018 1300   Hepatic Function Panel     Component Value Date/Time   PROT 7.4 01/01/2019 1535   PROT 7.8 10/09/2018 1300   ALBUMIN 4.6 10/09/2018 1300   AST 18 01/01/2019 1535   ALT 13 01/01/2019 1535   ALKPHOS 100 10/09/2018 1300   BILITOT 0.3 01/01/2019 1535   BILITOT 0.6 10/09/2018 1300   BILIDIR 0.1 04/03/2018 1457   IBILI 0.3 04/03/2018 1457      Component Value Date/Time   TSH 3.24 10/01/2018 1452   TSH 5.28 (H) 03/18/2018 1642   TSH 4.82 (H) 02/18/2018 1609     Ref. Range 01/01/2019 15:35  Vitamin D, 25-Hydroxy Latest Ref Range: 30 - 100 ng/mL 45    I, Doreene Nest, am acting as Location manager for Dennard Nip, MD I have reviewed the above documentation for accuracy and completeness, and I agree with the above. -Dennard Nip, MD

## 2019-03-06 NOTE — Progress Notes (Signed)
Reviewed and agree with assessment/plan.   Maalle Starrett, MD [facility] 08/01/2016, 12:24 PM Pager:  [phone]  

## 2019-03-07 DIAGNOSIS — G4733 Obstructive sleep apnea (adult) (pediatric): Secondary | ICD-10-CM | POA: Diagnosis not present

## 2019-03-07 DIAGNOSIS — J471 Bronchiectasis with (acute) exacerbation: Secondary | ICD-10-CM | POA: Diagnosis not present

## 2019-03-09 ENCOUNTER — Telehealth: Payer: Self-pay | Admitting: Internal Medicine

## 2019-03-09 MED ORDER — ATENOLOL 25 MG PO TABS: 12.5000 mg | ORAL_TABLET | Freq: Every day | ORAL | 0 refills | Status: DC | PRN

## 2019-03-09 NOTE — Telephone Encounter (Signed)
Medication Refill - Medication:  atenolol (TENORMIN) 25 MG tablet  Has the patient contacted their pharmacy? Yes told to call PCP  Preferred Pharmacy (with phone number or street name):  [id number] - [place], [place] [phone] (Phone) [phone] (Fax)   Agent: Please be advised that RX refills may take up to 3 business days. We ask that you follow-up with your pharmacy.

## 2019-03-09 NOTE — Progress Notes (Signed)
Office: [phone]  /  Fax: [phone] TeleHealth Visit:  Jill Harper has verbally consented to this TeleHealth visit today. The patient is located at home, the provider is located at the [facility] office. The participants in this visit include the listed provider and patient. The visit was conducted today via Webex.  HPI:   Chief Complaint: OBESITY Jill Harper is here to discuss her progress with her obesity treatment plan. She is on the Category 2 plan and is following her eating plan approximately 95% of the time. She states she is stretching and using resistance bands while watching TV 15-20 minutes 2-3 times per week. [name] feels she has done well maintaining her weight and weighed 265 lbs at home this a.m. She notes hunger is controlled but she has deviated from her plan significantly. She states she is ready to get back on track. We were unable to weigh the patient today for this TeleHealth visit. She feels as if she has maintained her weight since her last visit. She has lost 1 lb since starting treatment with [name].  Edema Nickole notes edema in bilateral hands, worse with increased sodium intake. She notes her rings are tighter over the last 1-2 weeks since she has increased salty carbs.  ASSESSMENT AND PLAN:  Localized edema  Class 3 severe obesity with serious comorbidity and body mass index (BMI) of 40.0 to 44.9 in adult, unspecified obesity type ([place])  PLAN:  Edema [name] was educated on how both simple carbs and increased sodium foods increase edema. We discussed ways to change her snacks to decrease edema.  I spent > than 50% of the 25 minute visit on counseling as documented in the note.  Obesity [name] is currently in the action stage of change. As such, her goal is to continue with weight loss efforts. She has agreed to follow the Category 2 plan. [name] has been instructed to work up to a goal of 150 minutes of combined cardio and strengthening exercise per  week for weight loss and overall health benefits. We discussed the following Behavioral Modification Strategies today: increasing lean protein intake, increase H20 intake, and decreasing sodium intake.  [name] has agreed to follow-up with our clinic in 2-3 weeks. She was informed of the importance of frequent follow-up visits to maximize her success with intensive lifestyle modifications for her multiple health conditions.  ALLERGIES: Allergies  Allergen Reactions  . Sulfonamide Derivatives     REACTION: hives    MEDICATIONS: Current Outpatient Medications on File Prior to Visit  Medication Sig Dispense Refill  . albuterol (PROVENTIL HFA) 108 (90 Base) MCG/ACT inhaler Inhale 2 puffs into the lungs 4 (four) times daily as needed. Reported on 09/03/2015 1 Inhaler 4  . albuterol (PROVENTIL) (2.5 MG/3ML) 0.083% nebulizer solution Take 3 mLs (2.5 mg total) by nebulization every 6 (six) hours as needed for wheezing or shortness of breath. 120 vial 5  . aspirin EC 81 MG tablet Take 81 mg by mouth daily.    [name] atenolol (TENORMIN) 25 MG tablet TAKE 1/2 TABLET BY MOUTH DAILY AS NEEDED FOR SYMPTOMS OF MITRAL PROLAPSE (Patient taking differently: Take 12.5 mg by mouth daily as needed (mitral prolapse). ) 45 tablet 1  . azaTHIOprine (IMURAN) 50 MG tablet TAKE 2 TABLETS(100 MG) BY MOUTH DAILY 60 tablet 2  . BREO ELLIPTA 100-25 MCG/INH AEPB INHALE 1 PUFF INTO THE LUNGS DAILY 60 each 5  . eletriptan (RELPAX) 20 MG tablet One tablet by mouth at onset of  headache. May repeat in 2 hours if headache persists or recurs. 10 tablet 0  . guaiFENesin (MUCINEX) 600 MG 12 hr tablet Take 2 tablets (1,200 mg total) by mouth 2 (two) times daily as needed for cough or to loosen phlegm. (Patient taking differently: Take 1,200 mg by mouth 2 (two) times daily. )    . Multiple Vitamins-Minerals (MULTIVITAMIN GUMMIES ADULT PO) Take 2 each by mouth daily.    [name] Respiratory Therapy Supplies (FLUTTER) DEVI Use as directed (Patient  taking differently: 2 (two) times daily. Use as directed) 1 each 0  . telmisartan (MICARDIS) 20 MG tablet Take 1 tablet (20 mg total) by mouth daily. 30 tablet 5   No current facility-administered medications on file prior to visit.     PAST MEDICAL HISTORY: Past Medical History:  Diagnosis Date  . Abnormal liver function test   . Bronchiectasis       . Dyspnea   . Fibromuscular dysplasia ([place])   . Fibromyalgia   . Heart murmur   . ILD (interstitial lung disease) ([place])   . Joint pain   . Menorrhagia   . MIGRAINE HEADACHE   . MVP (mitral valve prolapse)   . Osteoarthritis   . Polymyositis ([place])    Dr [name]; chronic MTX  . Pulmonary fibrosis ([place])   . Rheumatoid arthritis ([place])   . Sleep apnea   . Vitamin B 12 deficiency   . Vitamin D deficiency     PAST SURGICAL HISTORY: Past Surgical History:  Procedure Laterality Date  . ablation uterine  2010  . CARDIAC CATHETERIZATION  2002   normal  . DILATION AND CURETTAGE OF UTERUS  08-31-08   Dr [name]  . IR ANGIO INTRA EXTRACRAN SEL COM CAROTID INNOMINATE BILAT MOD SED  09/18/2018  . IR ANGIO VERTEBRAL SEL VERTEBRAL BILAT MOD SED  09/18/2018  . IR US GUIDE VASC ACCESS RIGHT  09/18/2018    SOCIAL HISTORY: Social History   Tobacco Use  . Smoking status: Never Smoker  . Smokeless tobacco: Never Used  . Tobacco comment: Married, lives with spouse. 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 Alcohol use: Never    Frequency: Never    Comment: maybe a wine cooler once every other year  . Drug use: Never    FAMILY HISTORY: Family History  Problem Relation Age of Onset  . Diabetes Mother   . Fibromyalgia Mother   . Ulcers Mother   . Heart failure Mother   . Thyroid disease Mother   . Obesity Mother   . Multiple myeloma Father   . Hypertension Father   . Stroke Father   . Lupus Sister   . Other Sister        abdominal adhesions resulting in bowel  obstruction  . Migraines Sister   . Heart disease Brother        bypass surgery  . Rheum arthritis Sister   . Multiple myeloma Sister   . Hypertension Sister   . Heart attack Sister   . Diabetes Sister   . Hypertension Sister   . Rheum arthritis Sister   . Diabetes Sister   . Diabetes Brother   . Headache Other        siblings with headaches but not diagnosed as migraines   ROS: Review of Systems  Musculoskeletal:       Positive for bilateral hand edema.   PHYSICAL EXAM: Pt in no  acute distress  RECENT LABS AND TESTS: BMET    Component Value Date/Time   NA 142 01/01/2019 1535   NA 144 10/09/2018 1300   K 4.1 01/01/2019 1535   CL 107 01/01/2019 1535   CO2 25 01/01/2019 1535   GLUCOSE 109 (H) 01/01/2019 1535   BUN 12 01/01/2019 1535   BUN 7 10/09/2018 1300   CREATININE 0.76 01/01/2019 1535   CALCIUM 9.8 01/01/2019 1535   GFRNONAA 90 01/01/2019 1535   GFRAA 104 01/01/2019 1535   Lab Results  Component Value Date   HGBA1C 5.5 10/09/2018   HGBA1C 4.8 04/24/2016   Lab Results  Component Value Date   INSULIN 22.4 10/09/2018   CBC    Component Value Date/Time   WBC 7.3 01/01/2019 1535   RBC 4.67 01/01/2019 1535   HGB 13.0 01/01/2019 1535   HCT 39.6 01/01/2019 1535   PLT 291 01/01/2019 1535   MCV 84.8 01/01/2019 1535   MCH 27.8 01/01/2019 1535   MCHC 32.8 01/01/2019 1535   RDW 12.4 01/01/2019 1535   LYMPHSABS 1,234 01/01/2019 1535   MONOABS 666 01/31/2017 1006   EOSABS 190 01/01/2019 1535   BASOSABS 37 01/01/2019 1535   Iron/TIBC/Ferritin/ %Sat No results found for: IRON, TIBC, FERRITIN, IRONPCTSAT Lipid Panel     Component Value Date/Time   CHOL 141 10/09/2018 1300   TRIG 116 10/09/2018 1300   HDL 44 10/09/2018 1300   CHOLHDL 3 04/24/2016 0900   VLDL 11.8 04/24/2016 0900   LDLCALC 74 10/09/2018 1300   Hepatic Function Panel     Component Value Date/Time   PROT 7.4 01/01/2019 1535   PROT 7.8 10/09/2018 1300   ALBUMIN 4.6 10/09/2018 1300    AST 18 01/01/2019 1535   ALT 13 01/01/2019 1535   ALKPHOS 100 10/09/2018 1300   BILITOT 0.3 01/01/2019 1535   BILITOT 0.6 10/09/2018 1300   BILIDIR 0.1 04/03/2018 1457   IBILI 0.3 04/03/2018 1457      Component Value Date/Time   TSH 3.24 10/01/2018 1452   TSH 5.28 (H) 03/18/2018 1642   TSH 4.82 (H) 02/18/2018 1609   Results for TINSLEIGH, SLOVACEK (MRN [id number]) as of 03/09/2019 09:18  Ref. Range 01/01/2019 15:35  Vitamin D, 25-Hydroxy Latest Ref Range: 30 - 100 ng/mL 45   I, Michaelene Song, am acting as Location manager for Dennard Nip, MD I have reviewed the above documentation for accuracy and completeness, and I agree with the above. -Dennard Nip, MD

## 2019-03-09 NOTE — Telephone Encounter (Signed)
Rx sent 

## 2019-03-14 NOTE — Progress Notes (Signed)
Subjective:    Patient ID: Jill Harper, female    DOB: 1966-02-11, 53 y.o.   MRN: [id number]  HPI The patient is here for an acute visit.   Dull pain on right side that she had about 2 months ago.  It resolved on its own after a few days.  This episode started 4 days ago and the pain has been constant.  She describes it as a dull pain, heavy feeling or pressure in that area.  Reminds her of inflammation.  She is not taking anything for it except for Tylenol.  There is no relation to movement and it is not worse with eating.  She denies any gastrointestinal, urinary symptoms.  There is no numbness, tingling or associated back pain.  She denies any rashes.  Her lung symptoms are unchanged.  She cannot think of any cause for the pain.  If the area is not touched her pain level will be about a 4/10.  With palpation it may reach a 8/10.     Medications and allergies reviewed with patient and updated if appropriate.  Patient Active Problem List   Diagnosis Date Noted  . Insulin resistance 11/27/2018  . Class 3 severe obesity with serious comorbidity and body mass index (BMI) of 40.0 to 44.9 in adult [facility]) 11/27/2018  . Fibromuscular dysplasia ([place]) 10/17/2018  . Hypertension 10/17/2018  . Bronchiectasis with (acute) exacerbation ([place]) 10/08/2018  . OSA (obstructive sleep apnea) 08/20/2018  . Rheumatoid arthritis ([place]) 04/17/2018  . ILD (interstitial lung disease) ([place]) 04/17/2018  . Dyspnea 04/01/2018  . Healthcare maintenance 04/01/2018  . Elevated TSH 03/18/2018  . Non-intractable vomiting without nausea 03/18/2018  . Dysphagia 03/18/2018  . Bronchiectasis ([place]) 09/02/2017  . LVH (left ventricular hypertrophy) 06/02/2017  . Mitral regurgitation 05/22/2017  . Wheezing 04/05/2017  . Jo-1 antibody positive 01/29/2017  . Primary osteoarthritis of both feet 01/29/2017  . Primary osteoarthritis of both hands 01/29/2017  . Trapezoid ligament sprain 01/17/2017  . Left shoulder  tendonitis 01/17/2017  . Vitamin D deficiency 04/26/2016  . High risk medication use 03/26/2016  . Frozen shoulder, right 03/26/2016  . Morbid obesity ([place]) 03/26/2016  . Cough 07/18/2015  . Neck mass 08/27/2013  . MVP (mitral valve prolapse)   . Erythema nodosum   . Throat clearing 05/07/2012  . INCONTINENCE, URGE 05/26/2010  . Migraine headache 02/15/2010  . Fibromyalgia 06/20/2009  . Polymyositis ([place]) 02/11/2008  . BRONCHIECTASIS 02/10/2008    Current Outpatient Medications on File Prior to Visit  Medication Sig Dispense Refill  . albuterol (PROVENTIL HFA) 108 (90 Base) MCG/ACT inhaler Inhale 2 puffs into the lungs 4 (four) times daily as needed. Reported on 09/03/2015 1 Inhaler 4  . albuterol (PROVENTIL) (2.5 MG/3ML) 0.083% nebulizer solution Take 3 mLs (2.5 mg total) by nebulization every 6 (six) hours as needed for wheezing or shortness of breath. 120 vial 5  . aspirin EC 81 MG tablet Take 81 mg by mouth daily.    [name] atenolol (TENORMIN) 25 MG tablet Take 0.5 tablets (12.5 mg total) by mouth daily as needed (mitral prolapse). 45 tablet 0  . azaTHIOprine (IMURAN) 50 MG tablet TAKE 2 TABLETS(100 MG) BY MOUTH DAILY 60 tablet 2  . BREO ELLIPTA 100-25 MCG/INH AEPB INHALE 1 PUFF INTO THE LUNGS DAILY 60 each 5  . eletriptan (RELPAX) 20 MG tablet One tablet by mouth at onset of headache. May repeat in 2 hours if headache persists or recurs. 10 tablet 0  . folic  acid (FOLVITE) 1 MG tablet TAKE 2 TABLETS(2 MG) BY MOUTH DAILY 180 tablet 3  . guaiFENesin (MUCINEX) 600 MG 12 hr tablet Take 2 tablets (1,200 mg total) by mouth 2 (two) times daily as needed for cough or to loosen phlegm. (Patient taking differently: Take 1,200 mg by mouth 2 (two) times daily. )    . metFORMIN (GLUCOPHAGE) 500 MG tablet Take 1 tablet (500 mg total) by mouth 2 (two) times daily with a meal. 60 tablet 0  . Multiple Vitamins-Minerals (MULTIVITAMIN GUMMIES ADULT PO) Take 2 each by mouth daily.    [name] Respiratory Therapy  Supplies (FLUTTER) DEVI Use as directed (Patient taking differently: 2 (two) times daily. Use as directed) 1 each 0  . telmisartan (MICARDIS) 20 MG tablet Take 1 tablet (20 mg total) by mouth daily. 30 tablet 5   No current facility-administered medications on file prior to visit.     Past Medical History:  Diagnosis Date  . Abnormal liver function test   . Bronchiectasis       . Dyspnea   . Fibromuscular dysplasia ([place])   . Fibromyalgia   . Heart murmur   . ILD (interstitial lung disease) ([place])   . Joint pain   . Menorrhagia   . MIGRAINE HEADACHE   . MVP (mitral valve prolapse)   . Osteoarthritis   . Polymyositis ([place])    Dr [name]; chronic MTX  . Pulmonary fibrosis ([place])   . Rheumatoid arthritis ([place])   . Sleep apnea   . Vitamin B 12 deficiency   . Vitamin D deficiency     Past Surgical History:  Procedure Laterality Date  . ablation uterine  2010  . CARDIAC CATHETERIZATION  2002   normal  . DILATION AND CURETTAGE OF UTERUS  08-31-08   Dr [name]  . IR ANGIO INTRA EXTRACRAN SEL COM CAROTID INNOMINATE BILAT MOD SED  09/18/2018  . IR ANGIO VERTEBRAL SEL VERTEBRAL BILAT MOD SED  09/18/2018  . IR US GUIDE VASC ACCESS RIGHT  09/18/2018    Social History   Socioeconomic History  . Marital status: Married    Spouse name: Brooklynne Pereida  . Number of children: 1  . Years of education: Not on file  . Highest education level: High school graduate  Occupational History  . 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 Financial resource strain: Not on file  . Food insecurity    Worry: Not on file    Inability: Not on file  . Transportation needs    Medical: Not on file    Non-medical: Not on file  Tobacco Use  . Smoking status: Never Smoker  . Smokeless tobacco: Never Used  . Tobacco comment: Married, lives with spouse. 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 Alcohol use: Never     Frequency: Never    Comment: maybe a wine cooler once every other year  . Drug use: Never  . Sexual activity: Yes    Birth control/protection: Surgical  Lifestyle  . Physical activity    Days per week: Not on file    Minutes per session: Not on file  . Stress: Not on file  Relationships  . Social Herbalist on phone: Not on file    Gets together: Not on file    Attends religious service: Not on file    Active member of club or organization: Not  on file    Attends meetings of clubs or organizations: Not on file    Relationship status: Not on file  Other Topics Concern  . Not on file  Social History Narrative   Exercise: trying to walk - limited by fatigue   Lives at home with husband and daughter   Right handed   Caffeine: occasional coke 1-2x per month    Family History  Problem Relation Age of Onset  . Diabetes Mother   . Fibromyalgia Mother   . Ulcers Mother   . Heart failure Mother   . Thyroid disease Mother   . Obesity Mother   . Multiple myeloma Father   . Hypertension Father   . Stroke Father   . Lupus Sister   . Other Sister        abdominal adhesions resulting in bowel obstruction  . Migraines Sister   . Heart disease Brother        bypass surgery  . Rheum arthritis Sister   . Multiple myeloma Sister   . Hypertension Sister   . Heart attack Sister   . Diabetes Sister   . Hypertension Sister   . Rheum arthritis Sister   . Diabetes Sister   . Diabetes Brother   . Headache Other        siblings with headaches but not diagnosed as migraines    Review of Systems  Constitutional: Negative for chills and fever.  Respiratory: Positive for cough (no change from chronic cough) and shortness of breath. Negative for wheezing.   Cardiovascular: Negative for chest pain.  Gastrointestinal: Negative for abdominal pain, blood in stool, constipation, diarrhea and nausea.       No gerd  Genitourinary: Positive for flank pain (right flank). Negative for  dysuria, frequency, hematuria and urgency.  Skin: Negative for rash.  Neurological: Negative for numbness.       Objective:   Vitals:   03/16/19 1546  BP: 138/82  Pulse: 74  Resp: 16  Temp: 98.2 F (36.8 C)  SpO2: 97%   BP Readings from Last 3 Encounters:  03/16/19 138/82  01/19/19 138/90  01/01/19 110/65   Wt Readings from Last 3 Encounters:  03/16/19 262 lb (118.8 kg)  01/19/19 266 lb 6.4 oz (120.8 kg)  01/01/19 273 lb (123.8 kg)   Body mass index is 39.84 kg/m.   Physical Exam Constitutional:      General: She is not in acute distress.    Appearance: Normal appearance. She is not ill-appearing.  HENT:     Head: Normocephalic and atraumatic.  Cardiovascular:     Rate and Rhythm: Normal rate and regular rhythm.  Pulmonary:     Effort: Pulmonary effort is normal. No respiratory distress.     Breath sounds: Normal breath sounds. No wheezing or rales.  Abdominal:     General: There is no distension.     Palpations: Abdomen is soft.     Tenderness: There is no abdominal tenderness.  Musculoskeletal:     Comments: Right posterior-lateral and anterior mid-lower rib cage tender to palpation.  Tender with more superficial palpation.  Area of mild swelling posterior-lateral mid rib region.  Skin:    General: Skin is warm and dry.     Findings: No erythema or rash.  Neurological:     Mental Status: She is alert.            Assessment & Plan:    See Problem List for Assessment and Plan of chronic medical  problems.

## 2019-03-16 ENCOUNTER — Ambulatory Visit (INDEPENDENT_AMBULATORY_CARE_PROVIDER_SITE_OTHER): Payer: Federal, State, Local not specified - PPO | Admitting: Internal Medicine

## 2019-03-16 ENCOUNTER — Encounter: Payer: Self-pay | Admitting: Internal Medicine

## 2019-03-16 ENCOUNTER — Other Ambulatory Visit: Payer: Self-pay

## 2019-03-16 DIAGNOSIS — R0781 Pleurodynia: Secondary | ICD-10-CM | POA: Diagnosis not present

## 2019-03-16 DIAGNOSIS — M940 Chondrocostal junction syndrome [Tietze]: Secondary | ICD-10-CM | POA: Diagnosis not present

## 2019-03-16 NOTE — Assessment & Plan Note (Signed)
4 days of right posterior-lateral-anterior mid-lower rib inflammation, tenderness No associated symptoms ?  Possible costochondritis.  She may have had some increased coughing which could have caused this ?  Related to autoimmune disease Without any other symptoms we will hold off on further testing Will try ice, heat and ibuprofen if okay with rheumatology We will discuss with pulmonary and rheumatology when she sees them-sees pulmonary this week

## 2019-03-16 NOTE — Patient Instructions (Addendum)
I agree you have some inflammation.  Try ice/heat.  Discuss with rheumatology and pulmonary.  Consider trying ibuprofen if ok with rheumatology.      Costochondritis  Costochondritis is swelling and irritation (inflammation) of the tissue (cartilage) that connects your ribs to your breastbone (sternum). This causes pain in the front of your chest. The pain usually starts gradually and involves more than one rib. What are the causes? The exact cause of this condition is not always known. It results from stress on the cartilage where your ribs attach to your sternum. The cause of this stress could be:  Chest injury (trauma).  Exercise or activity, such as lifting.  Severe coughing. What increases the risk? You may be at higher risk for this condition if you:  Are female.  Are 1?53 years old.  Recently started a new exercise or work activity.  Have low levels of vitamin D.  Have a condition that makes you cough frequently. What are the signs or symptoms? The main symptom of this condition is chest pain. The pain:  Usually starts gradually and can be sharp or dull.  Gets worse with deep breathing, coughing, or exercise.  Gets better with rest.  May be worse when you press on the sternum-rib connection (tenderness). How is this diagnosed? This condition is diagnosed based on your symptoms, medical history, and a physical exam. Your health care provider will check for tenderness when pressing on your sternum. This is the most important finding. You may also have tests to rule out other causes of chest pain. These may include:  A chest X-ray to check for lung problems.  An electrocardiogram (ECG) to see if you have a heart problem that could be causing the pain.  An imaging scan to rule out a chest or rib fracture. How is this treated? This condition usually goes away on its own over time. Your health care provider may prescribe an NSAID to reduce pain and inflammation. Your  health care provider may also suggest that you:  Rest and avoid activities that make pain worse.  Apply heat or cold to the area to reduce pain and inflammation.  Do exercises to stretch your chest muscles. If these treatments do not help, your health care provider may inject a numbing medicine at the sternum-rib connection to help relieve the pain. Follow these instructions at home:  Avoid activities that make pain worse. This includes any activities that use chest, abdominal, and side muscles.  If directed, put ice on the painful area: ? Put ice in a plastic bag. ? Place a towel between your skin and the bag. ? Leave the ice on for 20 minutes, 2-3 times a day.  If directed, apply heat to the affected area as often as told by your health care provider. Use the heat source that your health care provider recommends, such as a moist heat pack or a heating pad. ? Place a towel between your skin and the heat source. ? Leave the heat on for 20-30 minutes. ? Remove the heat if your skin turns bright red. This is especially important if you are unable to feel pain, heat, or cold. You may have a greater risk of getting burned.  Take over-the-counter and prescription medicines only as told by your health care provider.  Return to your normal activities as told by your health care provider. Ask your health care provider what activities are safe for you.  Keep all follow-up visits as told by your health  care provider. This is important. Contact a health care provider if:  You have chills or a fever.  Your pain does not go away or it gets worse.  You have a cough that does not go away (is persistent). Get help right away if:  You have shortness of breath. This information is not intended to replace advice given to you by your health care provider. Make sure you discuss any questions you have with your health care provider. Document Released: 05/02/2005 Document Revised: 08/07/2017 Document  Reviewed: 11/16/2015 Elsevier Patient Education  2020 [employer].

## 2019-03-17 NOTE — Progress Notes (Signed)
Reviewed and agree with assessment/plan.   Anh Mangano, MD [facility] 08/01/2016, 12:24 PM Pager:  [phone]  

## 2019-03-18 ENCOUNTER — Other Ambulatory Visit: Payer: Self-pay

## 2019-03-18 ENCOUNTER — Encounter (INDEPENDENT_AMBULATORY_CARE_PROVIDER_SITE_OTHER): Payer: Self-pay | Admitting: Family Medicine

## 2019-03-18 ENCOUNTER — Encounter: Payer: Self-pay | Admitting: Pulmonary Disease

## 2019-03-18 ENCOUNTER — Telehealth (INDEPENDENT_AMBULATORY_CARE_PROVIDER_SITE_OTHER): Payer: Federal, State, Local not specified - PPO | Admitting: Family Medicine

## 2019-03-18 ENCOUNTER — Ambulatory Visit: Payer: Federal, State, Local not specified - PPO | Admitting: Pulmonary Disease

## 2019-03-18 ENCOUNTER — Ambulatory Visit (INDEPENDENT_AMBULATORY_CARE_PROVIDER_SITE_OTHER): Payer: Federal, State, Local not specified - PPO

## 2019-03-18 VITALS — BP 116/72 | HR 80 | Temp 98.1°F | Ht 68.0 in | Wt 261.8 lb

## 2019-03-18 DIAGNOSIS — M069 Rheumatoid arthritis, unspecified: Secondary | ICD-10-CM | POA: Diagnosis not present

## 2019-03-18 DIAGNOSIS — M940 Chondrocostal junction syndrome [Tietze]: Secondary | ICD-10-CM | POA: Diagnosis not present

## 2019-03-18 DIAGNOSIS — J849 Interstitial pulmonary disease, unspecified: Secondary | ICD-10-CM

## 2019-03-18 DIAGNOSIS — R079 Chest pain, unspecified: Secondary | ICD-10-CM | POA: Diagnosis not present

## 2019-03-18 DIAGNOSIS — R7303 Prediabetes: Secondary | ICD-10-CM

## 2019-03-18 DIAGNOSIS — R0781 Pleurodynia: Secondary | ICD-10-CM

## 2019-03-18 DIAGNOSIS — E66813 Obesity, class 3: Secondary | ICD-10-CM

## 2019-03-18 DIAGNOSIS — G4733 Obstructive sleep apnea (adult) (pediatric): Secondary | ICD-10-CM

## 2019-03-18 DIAGNOSIS — Z6841 Body Mass Index (BMI) 40.0 and over, adult: Secondary | ICD-10-CM | POA: Diagnosis not present

## 2019-03-18 DIAGNOSIS — M3320 Polymyositis, organ involvement unspecified: Secondary | ICD-10-CM

## 2019-03-18 DIAGNOSIS — J479 Bronchiectasis, uncomplicated: Secondary | ICD-10-CM | POA: Diagnosis not present

## 2019-03-18 DIAGNOSIS — G4734 Idiopathic sleep related nonobstructive alveolar hypoventilation: Secondary | ICD-10-CM

## 2019-03-18 NOTE — Progress Notes (Signed)
[facility]  Chief Complaint  Patient presents with  . Follow-up    right side rib pain and follow up after PFT    Constitutional:  BP 116/72 (BP Location: Right Wrist, Cuff Size: Normal)   Pulse 80   Temp 98.1 F (36.7 C) (Oral)   Ht _0  (1.727 m)   Wt 261 lb 12.8 oz (118.8 kg)   SpO2 98%   BMI 39.81 kg/m   Past Medical History:  Fibromyalgia, Migraine HA, MVP, OA, Fibromuscular dysplasia, Erythema nodosum, Vit D deficiency  Brief Summary:  Jill Harper is a 53 y.o. female with bronchiectasis in the setting of seropositive rheumatoid arthritis and polymyositis.  She had home sleep study in December 2019.  Showed mild sleep apnea with oxygen desaturation.  Started on auto CPAP.  This has helped.  Sleeping better.  No issues with mask fit.  Denies sore throat, or dry mouth.  Has been getting discomfort in chest.  This is around her lower ribs and sternum.  Typical symptom pattern.  Feels symptoms are more superficial.  Resolve spontaneously.  CXR today reviewed by me showed chronic changes from BTX, but no acute findings to explain her symptoms otherwise.  PFT from July 2020 showed mild restriction, but otherwise normal.  She is followed by Dr. Estanislado Pandy for her RA and polymyositis.  Has been maintained on imuran.  ESR 39 and CK 52 from 10/01/18.  Echo from October 2019 showed mild MR.  Physical Exam:   Appearance - well kempt   ENMT - no sinus tenderness, no nasal discharge, no oral exudate  Neck - no masses, trachea midline, no thyromegaly, no elevation in JVP  Respiratory - normal appearance of chest wall, normal respiratory effort w/o accessory muscle use, no dullness on percussion, no wheezing or rales  CV - s1s2 regular rate and rhythm, 2/6 systolic murmurs, no peripheral edema, radial pulses symmetric  GI - soft, non tender  Lymph - no adenopathy noted in neck and axillary areas  MSK - normal gait  Ext - no  cyanosis, clubbing, or joint inflammation noted  Skin - no rashes, lesions, or ulcers  Neuro - normal strength, oriented x 3  Psych - normal mood and affect   Dg Chest 2 View  Result Date: 03/18/2019 CLINICAL DATA:  Chest pain EXAM: CHEST - 2 VIEW COMPARISON:  October 08, 2018 FINDINGS: There is fibrotic change throughout the lungs bilaterally with focal scarring in the left base, stable. No frank edema or consolidation. No evident new opacity. Heart size and pulmonary vascularity are normal. No adenopathy. No bone lesions are demonstrable by radiography. No pneumothorax. IMPRESSION: Stable fibrosis with scarring focally in the left base. No consolidation or edema. Stable cardiac silhouette. No bone lesions evident. No pneumothorax. Electronically Signed   By: Lowella Grip III M.D.   On: 03/18/2019 12:48    CMP Latest Ref Rng & Units 01/01/2019 10/09/2018 09/22/2018  Glucose 65 - 99 mg/dL 109(H) 101(H) 84  BUN 7 - 25 mg/dL _1 Creatinine 0.50 - 1.05 mg/dL 0.76 0.82 0.74  Sodium 135 - 146 mmol/L 142 144 138  Potassium 3.5 - 5.3 mmol/L 4.1 4.5 4.3  Chloride 98 - 110 mmol/L 107 106 105  CO2 20 - 32 mmol/L 25 17(L) 25  Calcium 8.6 - 10.4 mg/dL 9.8 10.0 10.0  Total Protein 6.1 - 8.1 g/dL 7.4 7.8 7.8  Total Bilirubin 0.2 - 1.2 mg/dL 0.3 0.6 0.7  Alkaline Phos 39 - 117 IU/L - 100 -  AST 10 - 35 U/L _0 ALT 6 - 29 U/L _1 CBC Latest Ref Rng & Units 01/01/2019 09/22/2018 09/18/2018  WBC 3.8 - 10.8 Thousand/uL 7.3 7.9 7.6  Hemoglobin 11.7 - 15.5 g/dL 13.0 14.5 13.8  Hematocrit 35.0 - 45.0 % 39.6 44.0 42.5  Platelets 140 - 400 Thousand/uL 291 313 292      Assessment/Plan:   ILD and Bronchiectasis in setting of RA and polymyositis. - remains on imuran through Dr. Estanislado Pandy - continue breo and prn albuterol - continue mucinex and flutter valve - not sure chest vest would be option for her given symptoms of fibromyalgia, and costochondritis  Obstructive sleep apnea. -  she is compliant with CPAP and reports benefit - continue auto CPAP  Nocturnal hypoxemia. - using 2 liters oxygen at night with CPAP  Costochondritis. - CXR reviewed by me today showed only chronic changes that wouldn't explain her symptoms - can use prn analgesics  Dyspnea on exertion. - she can't walk more than 200 feet w/o having to stop and rest - completed handicap parking form in October 2019   Patient Instructions  Follow up in 4 months    Chesley Mires, MD [place] Pager: [phone] 03/18/2019, 12:27 PM  Flow Sheet    Pulmonary tests:  PFT 10/30/11 >> FEV1 3.31 (114%), FEV1% 85, TLC 6.06 (108%), DLCO 77%, no BD PFT 11/18/12 >> FEV1 3.18 (111%), FEV1% 84, TLC 5.34 (95%), DLCO 67%, no BD PFT 12/02/15 >> FEV1 2.96 (108%), FEV1% 92, TLC 4.95 (86%), DLCO 65%, no BD Serology 01/31/17 >> ANA negative, RF 29, anti CCP 213, SCL 70 negative, SSA/SSB negative, ENA SM negative, Jo-1 positive Quantiferon gold 01/31/17 >> negative PFT 04/17/18 >> FEV1 2.46 (101%), FEV1% 90, DLCO 76% PFT 02/05/19 >> FEV1 2.30 (87%), FEV1% 89, TLC 4.21 (74%), DLCO 81%, no BD  Chest imaging:  CT chest 03/23/05 >> patchy b/l lower lung ASD with cylindrical BTX CT chest 02/17/08 >> peripheral and basilar predominant subpleural GGO CT chest 12/01/08 >> no change HRCT chest 09/07/15 >> scattered GGO HRCT chest 04/15/18 >> patchy confluent subpleural and peripheral peribronchovascular reticulation and ground-glass opacity throughout both lungs with a strong basilar gradient, traction BTX  Labs:  Serology 01/31/17 >> RF 29, CCP 213, Jo-1 > 8; ANA, SCL 70, SSA/SSB, ENA Sm negative Quantiferon gold 01/31/17 >> negative Ig 01/31/17 >> IgG 1992, IgA 147, IgM 136  Sleep testing:  HST 07/28/18 >> AHI 5, SpO2 low 83% ONO with CPAP 11/05/18 >> test time 6 hrs 24 min, baseline SpO2 90%, low SpO2 80%; spent 1 hr 15 min with SpO2 < 88% Auto CPAP 02/15/19 to 03/16/19 >> used on 30 of 30 nights with  average 7 hrs 41 min.  Average AHI 1.2 with median CPAP 6 and 95 th percentile CPAP 8 cm H2O  Cardiac tests:  Echo 05/23/18 >> EF 60 to 65%, grade 1 DD, mild MR   Medications:   Allergies as of 03/18/2019      Reactions   Sulfonamide Derivatives    REACTION: hives      Medication List       Accurate as of March 18, 2019 12:27 PM. If you have any questions, ask your nurse or doctor.        albuterol 108 (90 Base) MCG/ACT inhaler Commonly known as: Proventil HFA Inhale 2 puffs into the lungs 4 (four) times  daily as needed. Reported on 09/03/2015   albuterol (2.5 MG/3ML) 0.083% nebulizer solution Commonly known as: PROVENTIL Take 3 mLs (2.5 mg total) by nebulization every 6 (six) hours as needed for wheezing or shortness of breath.   aspirin EC 81 MG tablet Take 81 mg by mouth daily.   atenolol 25 MG tablet Commonly known as: TENORMIN Take 0.5 tablets (12.5 mg total) by mouth daily as needed (mitral prolapse).   azaTHIOprine 50 MG tablet Commonly known as: IMURAN TAKE 2 TABLETS(100 MG) BY MOUTH DAILY   Breo Ellipta 100-25 MCG/INH Aepb Generic drug: fluticasone furoate-vilanterol INHALE 1 PUFF INTO THE LUNGS DAILY   eletriptan 20 MG tablet Commonly known as: Relpax One tablet by mouth at onset of headache. May repeat in 2 hours if headache persists or recurs.   Flutter Devi Use as directed What changed: when to take this   folic acid 1 MG tablet Commonly known as: FOLVITE TAKE 2 TABLETS(2 MG) BY MOUTH DAILY   guaiFENesin 600 MG 12 hr tablet Commonly known as: Mucinex Take 2 tablets (1,200 mg total) by mouth 2 (two) times daily as needed for cough or to loosen phlegm. What changed: when to take this   metFORMIN 500 MG tablet Commonly known as: GLUCOPHAGE Take 1 tablet (500 mg total) by mouth 2 (two) times daily with a meal.   MULTIVITAMIN GUMMIES ADULT PO Take 2 each by mouth daily.   telmisartan 20 MG tablet Commonly known as: MICARDIS Take 1 tablet  (20 mg total) by mouth daily.       Past Surgical History:  She  has a past surgical history that includes Cardiac catheterization (2002); Dilation and curettage of uterus (08-31-08); ablation uterine (2010); IR ANGIO VERTEBRAL SEL VERTEBRAL BILAT MOD SED (09/18/2018); IR ANGIO INTRA EXTRACRAN SEL COM CAROTID INNOMINATE BILAT MOD SED (09/18/2018); and IR US Guide Vasc Access Right (09/18/2018).  Family History:  Her family history includes Diabetes in her brother, mother, sister, and sister; Fibromyalgia in her mother; Headache in an other family member; Heart attack in her sister; Heart disease in her brother; Heart failure in her mother; Hypertension in her father, sister, and sister; Lupus in her sister; Migraines in her sister; Multiple myeloma in her father and sister; Obesity in her mother; Other in her sister; Rheum arthritis in her sister and sister; Stroke in her father; Thyroid disease in her mother; Ulcers in her mother.  Social History:  She  reports that she has never smoked. She has never used smokeless tobacco. She reports that she does not drink alcohol or use drugs.

## 2019-03-18 NOTE — Patient Instructions (Signed)
Follow up in 4 months 

## 2019-03-19 ENCOUNTER — Telehealth: Payer: Self-pay | Admitting: Rheumatology

## 2019-03-19 ENCOUNTER — Encounter: Payer: Self-pay | Admitting: Internal Medicine

## 2019-03-19 NOTE — Telephone Encounter (Signed)
Returned patient call.  Her PCP and pulmonologist advised her to take an NSAID for costochondritis.  She was advised to call our office to see if that would be an issue since she is on Imuran.  Advised patient she may take an NSAID with Imuran as there is no contraindication.  Advised her to take the lowest effective dose for the shortest amount of time possible.  Patient verbalized understanding.  All questions encouraged and answered.   Mariella Saa, PharmD, [place], [place] [facility] Pharmacist [phone]  03/19/2019 9:30 AM

## 2019-03-19 NOTE — Telephone Encounter (Signed)
Patient called stating her PCP diagnosed her with costochondritis and recommends anti-inflammatory medication.  Patient is requesting a return call to let her know if she can take Ibuprofen with Imuran.

## 2019-03-23 NOTE — Progress Notes (Signed)
Office: [phone]  /  Fax: [phone] TeleHealth Visit:  Jill Harper has verbally consented to this TeleHealth visit today. The patient is located at home, the provider is located at the [facility] office. The participants in this visit include the listed provider and patient. The visit was conducted today via telephone call (Webex failed - changed to telephone call).  HPI:   Chief Complaint: OBESITY Jill Harper is here to discuss her progress with her obesity treatment plan. She is on the Category 2 plan and is following her eating plan approximately 98% of the time. She states she is exercising 0 minutes 0 times per week. Jill Harper feels she is doing well on her eating plan and thinks she has lost 2 more lbs since her last visit. She was diagnosed with costochondritis this week but is starting to feel better. We were unable to weigh the patient today for this TeleHealth visit. She feels as if she has lost a couple of pounds since her last visit. She has lost 1 lb since starting treatment with [name].  Pre-Diabetes Jill Harper has a diagnosis of prediabetes based on her elevated Hgb A1c and was informed this puts her at greater risk of developing diabetes. She is on metformin and continues to do well with diet overall and continues to lose weight. She denies hypoglycemia.  Costochondritis Jill Harper was diagnosed this week after a significant coughing fit. Chest x-ray at Pulmonary showed no bone lesions or fracture. No pneumothorax.  ASSESSMENT AND PLAN:  Prediabetes  Costochondritis, acute  Class 3 severe obesity with serious comorbidity and body mass index (BMI) of 40.0 to 44.9 in adult, unspecified obesity type [facility])  PLAN:  Pre-Diabetes Jill Harper will continue to work on weight loss, exercise, and decreasing simple carbohydrates in her diet to help decrease the risk of diabetes. We dicussed metformin including benefits and risks. She was informed that eating too many simple carbohydrates or  too many calories at one sitting increases the likelihood of GI side effects. Jill Harper will continue diet, exercise, and metformin. She will have labs checked at her next in-office visit.  Costochondritis Jill Harper will do some mild walking for exercise but hold activity which causes excessive respirations, "huffing and puffing."  I spent > than 50% of the 25 minute visit on counseling as documented in the note.  Obesity Jill Harper is currently in the action stage of change. As such, her goal is to continue with weight loss efforts. She has agreed to follow the Category 2 plan. Jill Harper has been instructed it is okay for her to walk but no significant aerobic exercise until her ribs heal.  We discussed the following Behavioral Modification Strategies today: increasing lean protein intake, work on meal planning and easy cooking plans.  Jill Harper has agreed to follow-up with our clinic in 2 weeks. She was informed of the importance of frequent follow-up visits to maximize her success with intensive lifestyle modifications for her multiple health conditions.  ALLERGIES: Allergies  Allergen Reactions   Sulfonamide Derivatives     REACTION: hives    MEDICATIONS: Current Outpatient Medications on File Prior to Visit  Medication Sig Dispense Refill   albuterol (PROVENTIL HFA) 108 (90 Base) MCG/ACT inhaler Inhale 2 puffs into the lungs 4 (four) times daily as needed. Reported on 09/03/2015 1 Inhaler 4   albuterol (PROVENTIL) (2.5 MG/3ML) 0.083% nebulizer solution Take 3 mLs (2.5 mg total) by nebulization every 6 (six) hours as needed for wheezing or shortness of breath. 120 vial 5  aspirin EC 81 MG tablet Take 81 mg by mouth daily.     atenolol (TENORMIN) 25 MG tablet Take 0.5 tablets (12.5 mg total) by mouth daily as needed (mitral prolapse). 45 tablet 0   azaTHIOprine (IMURAN) 50 MG tablet TAKE 2 TABLETS(100 MG) BY MOUTH DAILY 60 tablet 2   BREO ELLIPTA 100-25 MCG/INH AEPB INHALE 1 PUFF INTO THE LUNGS DAILY  60 each 5   eletriptan (RELPAX) 20 MG tablet One tablet by mouth at onset of headache. May repeat in 2 hours if headache persists or recurs. 10 tablet 0   folic acid (FOLVITE) 1 MG tablet TAKE 2 TABLETS(2 MG) BY MOUTH DAILY 180 tablet 3   guaiFENesin (MUCINEX) 600 MG 12 hr tablet Take 2 tablets (1,200 mg total) by mouth 2 (two) times daily as needed for cough or to loosen phlegm. (Patient taking differently: Take 1,200 mg by mouth 2 (two) times daily. )     metFORMIN (GLUCOPHAGE) 500 MG tablet Take 1 tablet (500 mg total) by mouth 2 (two) times daily with a meal. 60 tablet 0   Multiple Vitamins-Minerals (MULTIVITAMIN GUMMIES ADULT PO) Take 2 each by mouth daily.     Respiratory Therapy Supplies (FLUTTER) DEVI Use as directed (Patient taking differently: 2 (two) times daily. Use as directed) 1 each 0   telmisartan (MICARDIS) 20 MG tablet Take 1 tablet (20 mg total) by mouth daily. 30 tablet 5   No current facility-administered medications on file prior to visit.     PAST MEDICAL HISTORY: Past Medical History:  Diagnosis Date   Abnormal liver function test    Bronchiectasis        Dyspnea    Fibromuscular dysplasia (HCC)    Fibromyalgia    Heart murmur    ILD (interstitial lung disease) (HCC)    Joint pain    Menorrhagia    MIGRAINE HEADACHE    MVP (mitral valve prolapse)    Osteoarthritis    Polymyositis (HCC)    Dr [name]; chronic MTX   Pulmonary fibrosis (HCC)    Rheumatoid arthritis ([place])    Sleep apnea    Vitamin B 12 deficiency    Vitamin D deficiency     PAST SURGICAL HISTORY: Past Surgical History:  Procedure Laterality Date   ablation uterine  2010   CARDIAC CATHETERIZATION  2002   normal   DILATION AND CURETTAGE OF UTERUS  08-31-08   Dr [name]   IR ANGIO INTRA EXTRACRAN SEL COM CAROTID INNOMINATE BILAT MOD SED  09/18/2018   IR ANGIO VERTEBRAL SEL VERTEBRAL BILAT MOD SED  09/18/2018   IR US GUIDE VASC ACCESS RIGHT   09/18/2018    SOCIAL HISTORY: Social History   Tobacco Use   Smoking status: Never Smoker   Smokeless tobacco: Never Used   Tobacco comment: Married, lives with spouse. 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  Substance Use Topics   Alcohol use: Never    Frequency: Never    Comment: maybe a wine cooler once every other year   Drug use: Never    FAMILY HISTORY: Family History  Problem Relation Age of Onset   Diabetes Mother    Fibromyalgia Mother    Ulcers Mother    Heart failure Mother    Thyroid disease Mother    Obesity Mother    Multiple myeloma Father    Hypertension Father    Stroke Father    Lupus Sister    Other  Sister        abdominal adhesions resulting in bowel obstruction   Migraines Sister    Heart disease Brother        bypass surgery   Rheum arthritis Sister    Multiple myeloma Sister    Hypertension Sister    Heart attack Sister    Diabetes Sister    Hypertension Sister    Rheum arthritis Sister    Diabetes Sister    Diabetes Brother    Headache Other        siblings with headaches but not diagnosed as migraines   ROS: Review of Systems  Respiratory:       Negative for pneumothorax.  Musculoskeletal:       Positive for costochondritis.  Endo/Heme/Allergies:       Negative for hypoglycemia.   PHYSICAL EXAM: Pt in no acute distress  RECENT LABS AND TESTS: BMET    Component Value Date/Time   NA 142 01/01/2019 1535   NA 144 10/09/2018 1300   K 4.1 01/01/2019 1535   CL 107 01/01/2019 1535   CO2 25 01/01/2019 1535   GLUCOSE 109 (H) 01/01/2019 1535   BUN 12 01/01/2019 1535   BUN 7 10/09/2018 1300   CREATININE 0.76 01/01/2019 1535   CALCIUM 9.8 01/01/2019 1535   GFRNONAA 90 01/01/2019 1535   GFRAA 104 01/01/2019 1535   Lab Results  Component Value Date   HGBA1C 5.5 10/09/2018   HGBA1C 4.8 04/24/2016   Lab Results  Component Value Date   INSULIN 22.4 10/09/2018   CBC      Component Value Date/Time   WBC 7.3 01/01/2019 1535   RBC 4.67 01/01/2019 1535   HGB 13.0 01/01/2019 1535   HCT 39.6 01/01/2019 1535   PLT 291 01/01/2019 1535   MCV 84.8 01/01/2019 1535   MCH 27.8 01/01/2019 1535   MCHC 32.8 01/01/2019 1535   RDW 12.4 01/01/2019 1535   LYMPHSABS 1,234 01/01/2019 1535   MONOABS 666 01/31/2017 1006   EOSABS 190 01/01/2019 1535   BASOSABS 37 01/01/2019 1535   Iron/TIBC/Ferritin/ %Sat No results found for: IRON, TIBC, FERRITIN, IRONPCTSAT Lipid Panel     Component Value Date/Time   CHOL 141 10/09/2018 1300   TRIG 116 10/09/2018 1300   HDL 44 10/09/2018 1300   CHOLHDL 3 04/24/2016 0900   VLDL 11.8 04/24/2016 0900   LDLCALC 74 10/09/2018 1300   Hepatic Function Panel     Component Value Date/Time   PROT 7.4 01/01/2019 1535   PROT 7.8 10/09/2018 1300   ALBUMIN 4.6 10/09/2018 1300   AST 18 01/01/2019 1535   ALT 13 01/01/2019 1535   ALKPHOS 100 10/09/2018 1300   BILITOT 0.3 01/01/2019 1535   BILITOT 0.6 10/09/2018 1300   BILIDIR 0.1 04/03/2018 1457   IBILI 0.3 04/03/2018 1457      Component Value Date/Time   TSH 3.24 10/01/2018 1452   TSH 5.28 (H) 03/18/2018 1642   TSH 4.82 (H) 02/18/2018 1609   Results for CRUCITA, LACORTE (MRN [id number]) as of 03/23/2019 16:05  Ref. Range 01/01/2019 15:35  Vitamin D, 25-Hydroxy Latest Ref Range: 30 - 100 ng/mL 45   I, Michaelene Song, am acting as Location manager for Dennard Nip, MD I have reviewed the above documentation for accuracy and completeness, and I agree with the above. -Dennard Nip, MD

## 2019-03-26 NOTE — Progress Notes (Signed)
Reviewed and agree with assessment/plan.   Edric Fetterman, MD [facility] 08/01/2016, 12:24 PM Pager:  [phone]  

## 2019-03-29 DIAGNOSIS — G4733 Obstructive sleep apnea (adult) (pediatric): Secondary | ICD-10-CM | POA: Diagnosis not present

## 2019-03-29 DIAGNOSIS — J471 Bronchiectasis with (acute) exacerbation: Secondary | ICD-10-CM | POA: Diagnosis not present

## 2019-03-30 ENCOUNTER — Other Ambulatory Visit: Payer: Self-pay | Admitting: Rheumatology

## 2019-03-30 NOTE — Telephone Encounter (Signed)
Spoke with patient and she state she is not in need of a refill. Patient advised she is due for labs this month. Patient will come by to get them done.

## 2019-04-02 ENCOUNTER — Telehealth (INDEPENDENT_AMBULATORY_CARE_PROVIDER_SITE_OTHER): Payer: Federal, State, Local not specified - PPO | Admitting: Family Medicine

## 2019-04-02 ENCOUNTER — Other Ambulatory Visit: Payer: Self-pay

## 2019-04-02 ENCOUNTER — Encounter (INDEPENDENT_AMBULATORY_CARE_PROVIDER_SITE_OTHER): Payer: Self-pay | Admitting: Family Medicine

## 2019-04-02 DIAGNOSIS — M797 Fibromyalgia: Secondary | ICD-10-CM

## 2019-04-02 DIAGNOSIS — Z6841 Body Mass Index (BMI) 40.0 and over, adult: Secondary | ICD-10-CM

## 2019-04-02 DIAGNOSIS — E88819 Insulin resistance, unspecified: Secondary | ICD-10-CM

## 2019-04-02 DIAGNOSIS — E66813 Obesity, class 3: Secondary | ICD-10-CM

## 2019-04-02 DIAGNOSIS — E8881 Metabolic syndrome: Secondary | ICD-10-CM | POA: Diagnosis not present

## 2019-04-02 MED ORDER — METFORMIN HCL 500 MG PO TABS: 500.0000 mg | ORAL_TABLET | Freq: Two times a day (BID) | ORAL | 0 refills | Status: DC

## 2019-04-06 ENCOUNTER — Other Ambulatory Visit: Payer: Self-pay

## 2019-04-06 DIAGNOSIS — Z79899 Other long term (current) drug therapy: Secondary | ICD-10-CM

## 2019-04-06 NOTE — Progress Notes (Signed)
Office: [phone]  /  Fax: [phone] TeleHealth Visit:  Jill Harper has verbally consented to this TeleHealth visit today. The patient is located at home, the provider is located at the [facility] office. The participants in this visit include the listed provider and patient. The visit was conducted today via telephone call (FaceTime failed - changed to telephone call).  HPI:   Chief Complaint: OBESITY Jill Harper is here to discuss her progress with her obesity treatment plan. She is on the Category 2 plan and is following her eating plan approximately 98% of the time. She states she is exercising 0 minutes 0 times per week. Jill Harper feels she has maintained her weight. She hasn't been feeling well with fibromyalgia and hasn't always been eating all of her food. She feels her water weight is increased due to worsening inflammation.  We were unable to weigh the patient today for this TeleHealth visit. She feels as if she has maintained her weight since her last visit. She has lost 1 lb since starting treatment with Jill Harper.  Insulin Resistance Jill Harper has a diagnosis of insulin resistance based on her elevated fasting insulin level >5. Although Jill Harper's blood glucose readings are still under good control, insulin resistance puts her at greater risk of metabolic syndrome and diabetes. She is stable on metformin currently and continues to work on diet and exercise to decrease risk of diabetes. No nausea, vomiting, or hypoglycemia. She does report decreased polyphagia.  Fibromyalgia Jill Harper notes feeling poorly over the last 2-3 weeks. She is followed by Rheumatology. She is not exercising much due to pain and fatigue.  ASSESSMENT AND PLAN:  Insulin resistance - Plan: metFORMIN (GLUCOPHAGE) 500 MG tablet  Fibromyalgia  Class 3 severe obesity with serious comorbidity and body mass index (BMI) of 40.0 to 44.9 in adult, unspecified obesity type ([place])  PLAN:  Insulin Resistance Jill Harper will  continue to work on weight loss, exercise, and decreasing simple carbohydrates in her diet to help decrease the risk of diabetes. We dicussed metformin including benefits and risks. She was informed that eating too many simple carbohydrates or too many calories at one sitting increases the likelihood of GI side effects. Jill Harper was given a refill on her metformin 500 mg #60 with 0 refills and agrees to follow-up with our clinic in 2 weeks.  Fibromyalgia Jill Harper was encouraged to do at least a little activity per day and try to do some stretches while she is getting better.  I spent > than 50% of the 25 minute visit on counseling as documented in the note.  Obesity Jill Harper is currently in the action stage of change. As such, her goal is to maintain her weight for now until fibromyalgia starts to improve. She has agreed to follow the Category 2 plan. Jill Harper has been instructed to work up to a goal of 150 minutes of combined cardio and strengthening exercise per week for weight loss and overall health benefits. We discussed the following Behavioral Modification Strategies today: increasing vegetables, increase H20 intake, and better snacking choices.  Jill Harper has agreed to follow-up with our clinic in 2 weeks. She was informed of the importance of frequent follow-up visits to maximize her success with intensive lifestyle modifications for her multiple health conditions.  ALLERGIES: Allergies  Allergen Reactions  . Sulfonamide Derivatives     REACTION: hives    MEDICATIONS: Current Outpatient Medications on File Prior to Visit  Medication Sig Dispense Refill  . albuterol (PROVENTIL HFA) 108 (90 Base)  MCG/ACT inhaler Inhale 2 puffs into the lungs 4 (four) times daily as needed. Reported on 09/03/2015 1 Inhaler 4  . albuterol (PROVENTIL) (2.5 MG/3ML) 0.083% nebulizer solution Take 3 mLs (2.5 mg total) by nebulization every 6 (six) hours as needed for wheezing or shortness of breath. 120 vial 5  . aspirin EC  81 MG tablet Take 81 mg by mouth daily.    [name] atenolol (TENORMIN) 25 MG tablet Take 0.5 tablets (12.5 mg total) by mouth daily as needed (mitral prolapse). 45 tablet 0  . azaTHIOprine (IMURAN) 50 MG tablet TAKE 2 TABLETS(100 MG) BY MOUTH DAILY 60 tablet 2  . BREO ELLIPTA 100-25 MCG/INH AEPB INHALE 1 PUFF INTO THE LUNGS DAILY 60 each 5  . eletriptan (RELPAX) 20 MG tablet One tablet by mouth at onset of headache. May repeat in 2 hours if headache persists or recurs. 10 tablet 0  . folic acid (FOLVITE) 1 MG tablet TAKE 2 TABLETS(2 MG) BY MOUTH DAILY 180 tablet 3  . guaiFENesin (MUCINEX) 600 MG 12 hr tablet Take 2 tablets (1,200 mg total) by mouth 2 (two) times daily as needed for cough or to loosen phlegm. (Patient taking differently: Take 1,200 mg by mouth 2 (two) times daily. )    . Multiple Vitamins-Minerals (MULTIVITAMIN GUMMIES ADULT PO) Take 2 each by mouth daily.    [name] Respiratory Therapy Supplies (FLUTTER) DEVI Use as directed (Patient taking differently: 2 (two) times daily. Use as directed) 1 each 0  . telmisartan (MICARDIS) 20 MG tablet Take 1 tablet (20 mg total) by mouth daily. 30 tablet 5   No current facility-administered medications on file prior to visit.     PAST MEDICAL HISTORY: Past Medical History:  Diagnosis Date  . Abnormal liver function test   . Bronchiectasis       . Dyspnea   . Fibromuscular dysplasia ([place])   . Fibromyalgia   . Heart murmur   . ILD (interstitial lung disease) ([place])   . Joint pain   . Menorrhagia   . MIGRAINE HEADACHE   . MVP (mitral valve prolapse)   . Osteoarthritis   . Polymyositis ([place])    Dr [name]; chronic MTX  . Pulmonary fibrosis ([place])   . Rheumatoid arthritis ([place])   . Sleep apnea   . Vitamin B 12 deficiency   . Vitamin D deficiency     PAST SURGICAL HISTORY: Past Surgical History:  Procedure Laterality Date  . ablation uterine  2010  . CARDIAC CATHETERIZATION  2002   normal  . DILATION AND CURETTAGE OF UTERUS   08-31-08   Dr [name]  . IR ANGIO INTRA EXTRACRAN SEL COM CAROTID INNOMINATE BILAT MOD SED  09/18/2018  . IR ANGIO VERTEBRAL SEL VERTEBRAL BILAT MOD SED  09/18/2018  . IR Jill [facility] GUIDE VASC ACCESS RIGHT  09/18/2018    SOCIAL HISTORY: Social History   Tobacco Use  . Smoking status: Never Smoker  . Smokeless tobacco: Never Used  . Tobacco comment: Married, lives with spouse. 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 Alcohol use: Never    Frequency: Never    Comment: maybe a wine cooler once every other year  . Drug use: Never    FAMILY HISTORY: Family History  Problem Relation Age of Onset  . Diabetes Mother   . Fibromyalgia Mother   . Ulcers Mother   . Heart failure Mother   . Thyroid disease Mother   .  Obesity Mother   . Multiple myeloma Father   . Hypertension Father   . Stroke Father   . Lupus Sister   . Other Sister        abdominal adhesions resulting in bowel obstruction  . Migraines Sister   . Heart disease Brother        bypass surgery  . Rheum arthritis Sister   . Multiple myeloma Sister   . Hypertension Sister   . Heart attack Sister   . Diabetes Sister   . Hypertension Sister   . Rheum arthritis Sister   . Diabetes Sister   . Diabetes Brother   . Headache Other        siblings with headaches but not diagnosed as migraines   ROS: Review of Systems  Gastrointestinal: Negative for nausea and vomiting.  Musculoskeletal: Positive for myalgias (fibromyalgia).  Endo/Heme/Allergies:       Negative for hypoglycemia. Positive for decreased polyphagia.   PHYSICAL EXAM: Pt in no acute distress  RECENT LABS AND TESTS: BMET    Component Value Date/Time   NA 142 01/01/2019 1535   NA 144 10/09/2018 1300   K 4.1 01/01/2019 1535   CL 107 01/01/2019 1535   CO2 25 01/01/2019 1535   GLUCOSE 109 (H) 01/01/2019 1535   BUN 12 01/01/2019 1535   BUN 7 10/09/2018 1300   CREATININE 0.76 01/01/2019 1535    CALCIUM 9.8 01/01/2019 1535   GFRNONAA 90 01/01/2019 1535   GFRAA 104 01/01/2019 1535   Lab Results  Component Value Date   HGBA1C 5.5 10/09/2018   HGBA1C 4.8 04/24/2016   Lab Results  Component Value Date   INSULIN 22.4 10/09/2018   CBC    Component Value Date/Time   WBC 7.3 01/01/2019 1535   RBC 4.67 01/01/2019 1535   HGB 13.0 01/01/2019 1535   HCT 39.6 01/01/2019 1535   PLT 291 01/01/2019 1535   MCV 84.8 01/01/2019 1535   MCH 27.8 01/01/2019 1535   MCHC 32.8 01/01/2019 1535   RDW 12.4 01/01/2019 1535   LYMPHSABS 1,234 01/01/2019 1535   MONOABS 666 01/31/2017 1006   EOSABS 190 01/01/2019 1535   BASOSABS 37 01/01/2019 1535   Iron/TIBC/Ferritin/ %Sat No results found for: IRON, TIBC, FERRITIN, IRONPCTSAT Lipid Panel     Component Value Date/Time   CHOL 141 10/09/2018 1300   TRIG 116 10/09/2018 1300   HDL 44 10/09/2018 1300   CHOLHDL 3 04/24/2016 0900   VLDL 11.8 04/24/2016 0900   LDLCALC 74 10/09/2018 1300   Hepatic Function Panel     Component Value Date/Time   PROT 7.4 01/01/2019 1535   PROT 7.8 10/09/2018 1300   ALBUMIN 4.6 10/09/2018 1300   AST 18 01/01/2019 1535   ALT 13 01/01/2019 1535   ALKPHOS 100 10/09/2018 1300   BILITOT 0.3 01/01/2019 1535   BILITOT 0.6 10/09/2018 1300   BILIDIR 0.1 04/03/2018 1457   IBILI 0.3 04/03/2018 1457      Component Value Date/Time   TSH 3.24 10/01/2018 1452   TSH 5.28 (H) 03/18/2018 1642   TSH 4.82 (H) 02/18/2018 1609   Results for SAHORY, NORDLING (MRN [id number]) as of 04/06/2019 11:54  Ref. Range 01/01/2019 15:35  Vitamin D, 25-Hydroxy Latest Ref Range: 30 - 100 ng/mL 45   I, Michaelene Song, am acting as Location manager for Dennard Nip, MD I have reviewed the above documentation for accuracy and completeness, and I agree with the above. -Dennard Nip, MD

## 2019-04-07 ENCOUNTER — Other Ambulatory Visit: Payer: Self-pay | Admitting: *Deleted

## 2019-04-07 DIAGNOSIS — J471 Bronchiectasis with (acute) exacerbation: Secondary | ICD-10-CM | POA: Diagnosis not present

## 2019-04-07 DIAGNOSIS — G4733 Obstructive sleep apnea (adult) (pediatric): Secondary | ICD-10-CM | POA: Diagnosis not present

## 2019-04-07 LAB — COMPLETE METABOLIC PANEL WITH GFR
AG Ratio: 1.4 (calc) (ref 1.0–2.5)
ALT: 16 U/L (ref 6–29)
AST: 19 U/L (ref 10–35)
Albumin: 4.6 g/dL (ref 3.6–5.1)
Alkaline phosphatase (APISO): 61 U/L (ref 37–153)
BUN: 11 mg/dL (ref 7–25)
CO2: 26 mmol/L (ref 20–32)
Calcium: 10.3 mg/dL (ref 8.6–10.4)
Chloride: 105 mmol/L (ref 98–110)
Creat: 0.69 mg/dL (ref 0.50–1.05)
GFR, Est African American: 115 mL/min/{1.73_m2} (ref >=60)
GFR, Est Non African American: 99 mL/min/{1.73_m2} (ref >=60)
Globulin: 3.4 g/dL (calc) (ref 1.9–3.7)
Glucose, Bld: 82 mg/dL (ref 65–99)
Potassium: 4.5 mmol/L (ref 3.5–5.3)
Sodium: 140 mmol/L (ref 135–146)
Total Bilirubin: 0.6 mg/dL (ref 0.2–1.2)
Total Protein: 8 g/dL (ref 6.1–8.1)

## 2019-04-07 LAB — CBC WITH DIFFERENTIAL/PLATELET
Absolute Monocytes: 529 cells/uL (ref 200–950)
Basophils Absolute: 27 cells/uL (ref 0–200)
Basophils Relative: 0.4 %
Eosinophils Absolute: 107 cells/uL (ref 15–500)
Eosinophils Relative: 1.6 %
HCT: 43 % (ref 35.0–45.0)
Hemoglobin: 14.2 g/dL (ref 11.7–15.5)
Lymphs Abs: 1353 cells/uL (ref 850–3900)
MCH: 28.1 pg (ref 27.0–33.0)
MCHC: 33 g/dL (ref 32.0–36.0)
MCV: 85 fL (ref 80.0–100.0)
MPV: 10.9 fL (ref 7.5–12.5)
Monocytes Relative: 7.9 %
Neutro Abs: 4683 cells/uL (ref 1500–7800)
Neutrophils Relative %: 69.9 %
Platelets: 309 10*3/uL (ref 140–400)
RBC: 5.06 10*6/uL (ref 3.80–5.10)
RDW: 13.2 % (ref 11.0–15.0)
Total Lymphocyte: 20.2 %
WBC: 6.7 10*3/uL (ref 3.8–10.8)

## 2019-04-07 MED ORDER — AZATHIOPRINE 50 MG PO TABS: ORAL_TABLET | ORAL | 2 refills | Status: DC

## 2019-04-07 NOTE — Telephone Encounter (Signed)
Last Visit: 01/01/19 Next Visit: 05/05/19 Labs: 04/06/19 WNL  Okay to refill per Dr. Estanislado Pandy

## 2019-04-15 ENCOUNTER — Telehealth (INDEPENDENT_AMBULATORY_CARE_PROVIDER_SITE_OTHER): Payer: Federal, State, Local not specified - PPO | Admitting: Family Medicine

## 2019-04-15 ENCOUNTER — Encounter (INDEPENDENT_AMBULATORY_CARE_PROVIDER_SITE_OTHER): Payer: Self-pay | Admitting: Family Medicine

## 2019-04-15 ENCOUNTER — Other Ambulatory Visit: Payer: Self-pay

## 2019-04-15 DIAGNOSIS — E8881 Metabolic syndrome: Secondary | ICD-10-CM | POA: Diagnosis not present

## 2019-04-15 DIAGNOSIS — Z6841 Body Mass Index (BMI) 40.0 and over, adult: Secondary | ICD-10-CM

## 2019-04-15 NOTE — Progress Notes (Signed)
Office: [phone]  /  Fax: [phone] TeleHealth Visit:  Jill Harper has verbally consented to this TeleHealth visit today. The patient is located at home, the provider is located at the [employer] [facility] office. The participants in this visit include the listed provider and patient. The visit was conducted today via telephone call (FaceTime failed - changed to telephone call).  HPI:   Chief Complaint: OBESITY Jill Harper is here to discuss her progress with her obesity treatment plan. She is on the Category 2 plan and is following her eating plan approximately 95% of the time. She states she is exercising 0 minutes 0 times per week. Jill Harper is doing better following her eating plan and feels she has lost a little weight. She is doing better with meal planning. Her weight this a.m. was 258 lbs.  We were unable to weigh the patient today for this TeleHealth visit. She feels as if she has lost weight since her last visit. She has lost 1 lb since starting treatment with [name].  Insulin Resistance [name] has a diagnosis of insulin resistance based on her elevated fasting insulin level >5. Although Jill Harper's blood glucose readings are still under good control, insulin resistance puts her at greater risk of metabolic syndrome and diabetes. She is stable on metformin currently and is doing better on her eating plan. She continues to work on diet and exercise to decrease risk of diabetes. She denies nausea, vomiting, and hypoglycemia. Her fasting glucose in Epic is below 100.  ASSESSMENT AND PLAN:  Insulin resistance  Class 3 severe obesity with serious comorbidity and body mass index (BMI) of 40.0 to 44.9 in adult, unspecified obesity type ([place])  PLAN:  Insulin Resistance Jill Harper will continue to work on weight loss, exercise, and decreasing simple carbohydrates in her diet to help decrease the risk of diabetes. We dicussed metformin including benefits and risks. She was informed that eating too many  simple carbohydrates or too many calories at one sitting increases the likelihood of GI side effects. [name] will continue metformin and diet. She will follow-up with [name] as directed to monitor her progress.  I spent > than 50% of the 15 minute visit on counseling as documented in the note.  Obesity Jill Harper is currently in the action stage of change. As such, her goal is to continue with weight loss efforts. She has agreed to follow the Category 2 plan. Jill Harper has been instructed to work up to a goal of 150 minutes of combined cardio and strengthening exercise per week for weight loss and overall health benefits. We discussed the following Behavioral Modification Strategies today: increasing lean protein intake and ways to avoid boredom eating.  Jill Harper has agreed to follow-up with our clinic in 3 weeks. She was informed of the importance of frequent follow-up visits to maximize her success with intensive lifestyle modifications for her multiple health conditions.  ALLERGIES: Allergies  Allergen Reactions  . Sulfonamide Derivatives     REACTION: hives    MEDICATIONS: Current Outpatient Medications on File Prior to Visit  Medication Sig Dispense Refill  . albuterol (PROVENTIL HFA) 108 (90 Base) MCG/ACT inhaler Inhale 2 puffs into the lungs 4 (four) times daily as needed. Reported on 09/03/2015 1 Inhaler 4  . albuterol (PROVENTIL) (2.5 MG/3ML) 0.083% nebulizer solution Take 3 mLs (2.5 mg total) by nebulization every 6 (six) hours as needed for wheezing or shortness of breath. 120 vial 5  . aspirin EC 81 MG tablet Take 81 mg by mouth  daily.    . atenolol (TENORMIN) 25 MG tablet Take 0.5 tablets (12.5 mg total) by mouth daily as needed (mitral prolapse). 45 tablet 0  . azaTHIOprine (IMURAN) 50 MG tablet TAKE 2 TABLETS(100 MG) BY MOUTH DAILY 60 tablet 2  . BREO ELLIPTA 100-25 MCG/INH AEPB INHALE 1 PUFF INTO THE LUNGS DAILY 60 each 5  . eletriptan (RELPAX) 20 MG tablet One tablet by mouth at onset of  headache. May repeat in 2 hours if headache persists or recurs. 10 tablet 0  . folic acid (FOLVITE) 1 MG tablet TAKE 2 TABLETS(2 MG) BY MOUTH DAILY 180 tablet 3  . guaiFENesin (MUCINEX) 600 MG 12 hr tablet Take 2 tablets (1,200 mg total) by mouth 2 (two) times daily as needed for cough or to loosen phlegm. (Patient taking differently: Take 1,200 mg by mouth 2 (two) times daily. )    . metFORMIN (GLUCOPHAGE) 500 MG tablet Take 1 tablet (500 mg total) by mouth 2 (two) times daily with a meal. 60 tablet 0  . Multiple Vitamins-Minerals (MULTIVITAMIN GUMMIES ADULT PO) Take 2 each by mouth daily.    [name] Respiratory Therapy Supplies (FLUTTER) DEVI Use as directed (Patient taking differently: 2 (two) times daily. Use as directed) 1 each 0  . telmisartan (MICARDIS) 20 MG tablet Take 1 tablet (20 mg total) by mouth daily. 30 tablet 5   No current facility-administered medications on file prior to visit.     PAST MEDICAL HISTORY: Past Medical History:  Diagnosis Date  . Abnormal liver function test   . Bronchiectasis       . Dyspnea   . Fibromuscular dysplasia ([place])   . Fibromyalgia   . Heart murmur   . ILD (interstitial lung disease) ([place])   . Joint pain   . Menorrhagia   . MIGRAINE HEADACHE   . MVP (mitral valve prolapse)   . Osteoarthritis   . Polymyositis ([place])    Dr [name]; chronic MTX  . Pulmonary fibrosis ([place])   . Rheumatoid arthritis ([place])   . Sleep apnea   . Vitamin B 12 deficiency   . Vitamin D deficiency     PAST SURGICAL HISTORY: Past Surgical History:  Procedure Laterality Date  . ablation uterine  2010  . CARDIAC CATHETERIZATION  2002   normal  . DILATION AND CURETTAGE OF UTERUS  08-31-08   Dr [name]  . IR ANGIO INTRA EXTRACRAN SEL COM CAROTID INNOMINATE BILAT MOD SED  09/18/2018  . IR ANGIO VERTEBRAL SEL VERTEBRAL BILAT MOD SED  09/18/2018  . IR US GUIDE VASC ACCESS RIGHT  09/18/2018    SOCIAL HISTORY: Social History   Tobacco Use  . Smoking  status: Never Smoker  . Smokeless tobacco: Never Used  . Tobacco comment: Married, lives with spouse. 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 Alcohol use: Never    Frequency: Never    Comment: maybe a wine cooler once every other year  . Drug use: Never    FAMILY HISTORY: Family History  Problem Relation Age of Onset  . Diabetes Mother   . Fibromyalgia Mother   . Ulcers Mother   . Heart failure Mother   . Thyroid disease Mother   . Obesity Mother   . Multiple myeloma Father   . Hypertension Father   . Stroke Father   . Lupus Sister   . Other Sister        abdominal adhesions  resulting in bowel obstruction  . Migraines Sister   . Heart disease Brother        bypass surgery  . Rheum arthritis Sister   . Multiple myeloma Sister   . Hypertension Sister   . Heart attack Sister   . Diabetes Sister   . Hypertension Sister   . Rheum arthritis Sister   . Diabetes Sister   . Diabetes Brother   . Headache Other        siblings with headaches but not diagnosed as migraines   ROS: Review of Systems  Gastrointestinal: Negative for nausea and vomiting.  Endo/Heme/Allergies:       Negative for hypoglycemia.   PHYSICAL EXAM: Pt in no acute distress  RECENT LABS AND TESTS: BMET    Component Value Date/Time   NA 140 04/06/2019 1551   NA 144 10/09/2018 1300   K 4.5 04/06/2019 1551   CL 105 04/06/2019 1551   CO2 26 04/06/2019 1551   GLUCOSE 82 04/06/2019 1551   BUN 11 04/06/2019 1551   BUN 7 10/09/2018 1300   CREATININE 0.69 04/06/2019 1551   CALCIUM 10.3 04/06/2019 1551   GFRNONAA 99 04/06/2019 1551   GFRAA 115 04/06/2019 1551   Lab Results  Component Value Date   HGBA1C 5.5 10/09/2018   HGBA1C 4.8 04/24/2016   Lab Results  Component Value Date   INSULIN 22.4 10/09/2018   CBC    Component Value Date/Time   WBC 6.7 04/06/2019 1551   RBC 5.06 04/06/2019 1551   HGB 14.2 04/06/2019 1551   HCT 43.0  04/06/2019 1551   PLT 309 04/06/2019 1551   MCV 85.0 04/06/2019 1551   MCH 28.1 04/06/2019 1551   MCHC 33.0 04/06/2019 1551   RDW 13.2 04/06/2019 1551   LYMPHSABS 1,353 04/06/2019 1551   MONOABS 666 01/31/2017 1006   EOSABS 107 04/06/2019 1551   BASOSABS 27 04/06/2019 1551   Iron/TIBC/Ferritin/ %Sat No results found for: IRON, TIBC, FERRITIN, IRONPCTSAT Lipid Panel     Component Value Date/Time   CHOL 141 10/09/2018 1300   TRIG 116 10/09/2018 1300   HDL 44 10/09/2018 1300   CHOLHDL 3 04/24/2016 0900   VLDL 11.8 04/24/2016 0900   LDLCALC 74 10/09/2018 1300   Hepatic Function Panel     Component Value Date/Time   PROT 8.0 04/06/2019 1551   PROT 7.8 10/09/2018 1300   ALBUMIN 4.6 10/09/2018 1300   AST 19 04/06/2019 1551   ALT 16 04/06/2019 1551   ALKPHOS 100 10/09/2018 1300   BILITOT 0.6 04/06/2019 1551   BILITOT 0.6 10/09/2018 1300   BILIDIR 0.1 04/03/2018 1457   IBILI 0.3 04/03/2018 1457      Component Value Date/Time   TSH 3.24 10/01/2018 1452   TSH 5.28 (H) 03/18/2018 1642   TSH 4.82 (H) 02/18/2018 1609   Results for CINTIA, GLEED (MRN [id number]) as of 04/15/2019 15:52  Ref. Range 01/01/2019 15:35  Vitamin D, 25-Hydroxy Latest Ref Range: 30 - 100 ng/mL 45   I, Michaelene Song, am acting as Location manager for Dennard Nip, MD I have reviewed the above documentation for accuracy and completeness, and I agree with the above. -Dennard Nip, MD

## 2019-04-21 NOTE — Progress Notes (Signed)
Subjective:    Patient ID: Jill Harper, female    DOB: 12-20-1965, 53 y.o.   MRN: [id number]  HPI The patient is here for follow up.   Hypertension: She is taking her medication daily. She is compliant with a low sodium diet.  She denies chest pain, palpitations, edema and regular headaches. She does not monitor her blood pressure at home.  She knows she needs to start monitoring her blood pressure at home and she will get a cuff.  Fibromuscular dysplasia: She is taking aspirin 81 mg daily.  She is taking the telmisartan for her blood pressure.  Right hand symptoms: A few days ago her right hand got very cold.  It was not blue or discolored.  Within the next 24-48 hours the MCP joints were swollen and more warm.  Within hours she was not able to close her hand/flex her fingers.  It was not tender to palpation, but she did have pain with trying to bend her fingers.  She could feel the pain going up into her forearm.  Her symptoms are much improved, but she still has some minimal swelling.  She does plan on talking to her rheumatologist about this-she sees them soon.    Medications and allergies reviewed with patient and updated if appropriate.  Patient Active Problem List   Diagnosis Date Noted  . Costochondritis 03/16/2019  . Rib pain on right side 03/16/2019  . Insulin resistance 11/27/2018  . Class 3 severe obesity with serious comorbidity and body mass index (BMI) of 40.0 to 44.9 in adult [facility]) 11/27/2018  . Fibromuscular dysplasia ([place]) 10/17/2018  . Hypertension 10/17/2018  . Bronchiectasis with (acute) exacerbation ([place]) 10/08/2018  . OSA (obstructive sleep apnea) 08/20/2018  . Rheumatoid arthritis ([place]) 04/17/2018  . ILD (interstitial lung disease) ([place]) 04/17/2018  . Dyspnea 04/01/2018  . Elevated TSH 03/18/2018  . Bronchiectasis ([place]) 09/02/2017  . LVH (left ventricular hypertrophy) 06/02/2017  . Mitral regurgitation 05/22/2017  . Wheezing 04/05/2017  . Jo-1 antibody  positive 01/29/2017  . Primary osteoarthritis of both feet 01/29/2017  . Primary osteoarthritis of both hands 01/29/2017  . Trapezoid ligament sprain 01/17/2017  . Left shoulder tendonitis 01/17/2017  . Vitamin D deficiency 04/26/2016  . High risk medication use 03/26/2016  . Frozen shoulder, right 03/26/2016  . Cough 07/18/2015  . Neck mass 08/27/2013  . MVP (mitral valve prolapse)   . Erythema nodosum   . INCONTINENCE, URGE 05/26/2010  . Migraine headache 02/15/2010  . Fibromyalgia 06/20/2009  . Polymyositis ([place]) 02/11/2008  . BRONCHIECTASIS 02/10/2008    Current Outpatient Medications on File Prior to Visit  Medication Sig Dispense Refill  . albuterol (PROVENTIL HFA) 108 (90 Base) MCG/ACT inhaler Inhale 2 puffs into the lungs 4 (four) times daily as needed. Reported on 09/03/2015 1 Inhaler 4  . albuterol (PROVENTIL) (2.5 MG/3ML) 0.083% nebulizer solution Take 3 mLs (2.5 mg total) by nebulization every 6 (six) hours as needed for wheezing or shortness of breath. 120 vial 5  . aspirin EC 81 MG tablet Take 81 mg by mouth daily.    [name] atenolol (TENORMIN) 25 MG tablet Take 0.5 tablets (12.5 mg total) by mouth daily as needed (mitral prolapse). 45 tablet 0  . azaTHIOprine (IMURAN) 50 MG tablet TAKE 2 TABLETS(100 MG) BY MOUTH DAILY 60 tablet 2  . BREO ELLIPTA 100-25 MCG/INH AEPB INHALE 1 PUFF INTO THE LUNGS DAILY 60 each 5  . eletriptan (RELPAX) 20 MG tablet One tablet by mouth at  onset of headache. May repeat in 2 hours if headache persists or recurs. 10 tablet 0  . folic acid (FOLVITE) 1 MG tablet TAKE 2 TABLETS(2 MG) BY MOUTH DAILY 180 tablet 3  . guaiFENesin (MUCINEX) 600 MG 12 hr tablet Take 2 tablets (1,200 mg total) by mouth 2 (two) times daily as needed for cough or to loosen phlegm. (Patient taking differently: Take 1,200 mg by mouth 2 (two) times daily. )    . metFORMIN (GLUCOPHAGE) 500 MG tablet Take 1 tablet (500 mg total) by mouth 2 (two) times daily with a meal. 60 tablet 0  .  Multiple Vitamins-Minerals (MULTIVITAMIN GUMMIES ADULT PO) Take 2 each by mouth daily.    [name] Respiratory Therapy Supplies (FLUTTER) DEVI Use as directed (Patient taking differently: 2 (two) times daily. Use as directed) 1 each 0  . telmisartan (MICARDIS) 20 MG tablet Take 1 tablet (20 mg total) by mouth daily. 30 tablet 5   No current facility-administered medications on file prior to visit.     Past Medical History:  Diagnosis Date  . Abnormal liver function test   . Bronchiectasis       . Dyspnea   . Fibromuscular dysplasia ([place])   . Fibromyalgia   . Heart murmur   . ILD (interstitial lung disease) ([place])   . Joint pain   . Menorrhagia   . MIGRAINE HEADACHE   . MVP (mitral valve prolapse)   . Osteoarthritis   . Polymyositis ([place])    Dr [name]; chronic MTX  . Pulmonary fibrosis ([place])   . Rheumatoid arthritis ([place])   . Sleep apnea   . Vitamin B 12 deficiency   . Vitamin D deficiency     Past Surgical History:  Procedure Laterality Date  . ablation uterine  2010  . CARDIAC CATHETERIZATION  2002   normal  . DILATION AND CURETTAGE OF UTERUS  08-31-08   Dr [name]  . IR ANGIO INTRA EXTRACRAN SEL COM CAROTID INNOMINATE BILAT MOD SED  09/18/2018  . IR ANGIO VERTEBRAL SEL VERTEBRAL BILAT MOD SED  09/18/2018  . IR US GUIDE VASC ACCESS RIGHT  09/18/2018    Social History   Socioeconomic History  . Marital status: Married    Spouse name: Lorriane Dehart  . Number of children: 1  . Years of education: Not on file  . Highest education level: High school graduate  Occupational History  . 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 Financial resource strain: Not on file  . Food insecurity    Worry: Not on file    Inability: Not on file  . Transportation needs    Medical: Not on file    Non-medical: Not on file  Tobacco Use  . Smoking status: Never Smoker  . Smokeless tobacco: Never Used  . Tobacco comment: Married, lives with spouse. 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 Alcohol use: Never    Frequency: Never    Comment: maybe a wine cooler once every other year  . Drug use: Never  . Sexual activity: Yes    Birth control/protection: Surgical  Lifestyle  . Physical activity    Days per week: Not on file    Minutes per session: Not on file  . Stress: Not on file  Relationships  . Social Herbalist on phone: Not on file    Gets together: Not on file  Attends religious service: Not on file    Active member of club or organization: Not on file    Attends meetings of clubs or organizations: Not on file    Relationship status: Not on file  Other Topics Concern  . Not on file  Social History Narrative   Exercise: trying to walk - limited by fatigue   Lives at home with husband and daughter   Right handed   Caffeine: occasional coke 1-2x per month    Family History  Problem Relation Age of Onset  . Diabetes Mother   . Fibromyalgia Mother   . Ulcers Mother   . Heart failure Mother   . Thyroid disease Mother   . Obesity Mother   . Multiple myeloma Father   . Hypertension Father   . Stroke Father   . Lupus Sister   . Other Sister        abdominal adhesions resulting in bowel obstruction  . Migraines Sister   . Heart disease Brother        bypass surgery  . Rheum arthritis Sister   . Multiple myeloma Sister   . Hypertension Sister   . Heart attack Sister   . Diabetes Sister   . Hypertension Sister   . Rheum arthritis Sister   . Diabetes Sister   . Diabetes Brother   . Headache Other        siblings with headaches but not diagnosed as migraines    Review of Systems  Constitutional: Negative for chills and fever.  Respiratory: Positive for cough and shortness of breath.   Cardiovascular: Negative for chest pain, palpitations and leg swelling.  Neurological: Positive for light-headedness (a few episodes - coughing). Negative for  headaches (has some head pain, but no headaches).       Objective:   Vitals:   04/22/19 1544  BP: 136/78  Pulse: 60  Resp: 16  Temp: (!) 97.5 F (36.4 C)  SpO2: 99%   BP Readings from Last 3 Encounters:  04/22/19 136/78  03/18/19 116/72  03/16/19 138/82   Wt Readings from Last 3 Encounters:  04/22/19 258 lb (117 kg)  03/18/19 261 lb 12.8 oz (118.8 kg)  03/16/19 262 lb (118.8 kg)   Body mass index is 39.23 kg/m.   Physical Exam    Constitutional: Appears well-developed and well-nourished. No distress.  HENT:  Head: Normocephalic and atraumatic.  Neck: Neck supple. No tracheal deviation present. No thyromegaly present.  No cervical lymphadenopathy Cardiovascular: Normal rate, regular rhythm and normal heart sounds.   2/6 systolic murmur heard. No carotid bruit .  No edema Pulmonary/Chest: Effort normal and breath sounds normal. No respiratory distress. No has no wheezes. No rales.  Skin: Skin is warm and dry. Not diaphoretic.  Psychiatric: Normal mood and affect. Behavior is normal.      Assessment & Plan:    See Problem List for Assessment and Plan of chronic medical problems.

## 2019-04-22 ENCOUNTER — Encounter: Payer: Self-pay | Admitting: Internal Medicine

## 2019-04-22 ENCOUNTER — Other Ambulatory Visit: Payer: Self-pay

## 2019-04-22 ENCOUNTER — Ambulatory Visit (INDEPENDENT_AMBULATORY_CARE_PROVIDER_SITE_OTHER): Payer: Federal, State, Local not specified - PPO | Admitting: Internal Medicine

## 2019-04-22 VITALS — BP 136/78 | HR 60 | Temp 97.5°F | Resp 16 | Ht 68.0 in | Wt 258.0 lb

## 2019-04-22 DIAGNOSIS — Z23 Encounter for immunization: Secondary | ICD-10-CM

## 2019-04-22 DIAGNOSIS — I10 Essential (primary) hypertension: Secondary | ICD-10-CM

## 2019-04-22 DIAGNOSIS — I773 Arterial fibromuscular dysplasia: Secondary | ICD-10-CM

## 2019-04-22 NOTE — Patient Instructions (Signed)
  Flu immunization administered today.     Medications reviewed and updated.  Changes include :   none     Please followup in 6 months   

## 2019-04-22 NOTE — Progress Notes (Signed)
Office Visit Note  Patient: Jill Harper             Date of Birth: 1966-04-22           MRN: [id number]             PCP: Binnie Rail, MD Referring: Binnie Rail, MD Visit Date: 05/05/2019 Occupation: @GUAROCC @  Subjective:  Muscle tenderness   History of Present Illness: Jill Harper is a 53 y.o. female with history of polymyositis, seropositive rheumatoid arthritis, fibromyalgia, and osteoarthritis.  She is taking Imuran 50 mg 2 tablets po daily.  She has not missed any doses of Imuran recently.  She states that about 1 month ago she experienced right hand pain and stiffness for about 4 days.  She states at that time she was unable to make a complete fist but states that she did not have any tenderness in the right hand.  She states that the stiffness subsided with massage.  She denies any joint pain or joint swelling at this time.  She states that her fibromyalgia has been flaring for the past several weeks.  She states that she had a bout of costochondritis and was evaluated by her pulmonologist as well as her PCP.  She states that she took Aleve for about 1 week and her symptoms subsided.  She states she continues to follow-up with Dr. stood on a regular basis.  She states that her cough has been stable and she has not had any increased shortness of breath.  She is using 2 L of oxygen at night with CPAP.  She has been checking her pulse ox at home on a regular basis and monitoring her blood pressure closely.  She denies any signs or symptoms of a polymyositis flare recently.  She denies any muscle weakness but continues to have tenderness due to fibromyalgia.   Activities of Daily Living:  Patient reports morning stiffness for 0 minutes.   Patient Reports nocturnal pain.  Difficulty dressing/grooming: Denies Difficulty climbing stairs: Denies Difficulty getting out of chair: Denies Difficulty using hands for taps, buttons, cutlery, and/or writing: Reports  Review of Systems    Constitutional: Positive for fatigue.  HENT: Positive for mouth dryness. Negative for mouth sores and nose dryness.   Eyes: Negative for pain, itching, visual disturbance and dryness.  Respiratory: Negative for cough, hemoptysis, shortness of breath and difficulty breathing.   Cardiovascular: Negative for chest pain, palpitations, hypertension and swelling in legs/feet.  Gastrointestinal: Negative for blood in stool, constipation, diarrhea, nausea and vomiting.  Endocrine: Negative for increased urination.  Genitourinary: Negative for difficulty urinating and painful urination.  Musculoskeletal: Positive for arthralgias, joint pain, joint swelling, muscle weakness and muscle tenderness. Negative for myalgias, morning stiffness and myalgias.  Skin: Positive for rash. Negative for color change, pallor, hair loss, nodules/bumps, skin tightness, ulcers and sensitivity to sunlight.  Allergic/Immunologic: Negative for susceptible to infections.  Neurological: Positive for headaches. Negative for dizziness and numbness.  Hematological: Negative for swollen glands.  Psychiatric/Behavioral: Negative for depressed mood, confusion and sleep disturbance. The patient is not nervous/anxious.     PMFS History:  Patient Active Problem List   Diagnosis Date Noted   Costochondritis 03/16/2019   Rib pain on right side 03/16/2019   Insulin resistance 11/27/2018   Class 3 severe obesity with serious comorbidity and body mass index (BMI) of 40.0 to 44.9 in adult [facility]) 11/27/2018   Fibromuscular dysplasia ([place]) 10/17/2018   Hypertension 10/17/2018   Bronchiectasis  with (acute) exacerbation ([place]) 10/08/2018   OSA (obstructive sleep apnea) 08/20/2018   Rheumatoid arthritis ([place]) 04/17/2018   ILD (interstitial lung disease) ([place]) 04/17/2018   Dyspnea 04/01/2018   Elevated TSH 03/18/2018   Bronchiectasis ([place]) 09/02/2017   LVH (left ventricular hypertrophy) 06/02/2017   Mitral regurgitation  05/22/2017   Wheezing 04/05/2017   Jo-1 antibody positive 01/29/2017   Primary osteoarthritis of both feet 01/29/2017   Primary osteoarthritis of both hands 01/29/2017   Left shoulder tendonitis 01/17/2017   Vitamin D deficiency 04/26/2016   Cough 07/18/2015   MVP (mitral valve prolapse)    Erythema nodosum    INCONTINENCE, URGE 05/26/2010   Migraine headache 02/15/2010   Fibromyalgia 06/20/2009   Polymyositis ([place]) 02/11/2008   BRONCHIECTASIS 02/10/2008    Past Medical History:  Diagnosis Date   Abnormal liver function test    Bronchiectasis        Dyspnea    Fibromuscular dysplasia ([place])    Fibromyalgia    Heart murmur    ILD (interstitial lung disease) ([place])    Joint pain    Menorrhagia    MIGRAINE HEADACHE    MVP (mitral valve prolapse)    Osteoarthritis    Polymyositis ([place])    Dr [name]; chronic MTX   Pulmonary fibrosis ([place])    Rheumatoid arthritis ([place])    Sleep apnea    Vitamin B 12 deficiency    Vitamin D deficiency     Family History  Problem Relation Age of Onset   Diabetes Mother    Fibromyalgia Mother    Ulcers Mother    Heart failure Mother    Thyroid disease Mother    Obesity Mother    Multiple myeloma Father    Hypertension Father    Stroke Father    Lupus Sister    Other Sister        abdominal adhesions resulting in bowel obstruction   Migraines Sister    Heart disease Brother        bypass surgery   Rheum arthritis Sister    Multiple myeloma Sister    Hypertension Sister    Heart attack Sister    Diabetes Sister    Hypertension Sister    Rheum arthritis Sister    Diabetes Sister    Diabetes Brother    Headache Other        siblings with headaches but not diagnosed as migraines   Past Surgical History:  Procedure Laterality Date   ablation uterine  2010   CARDIAC CATHETERIZATION  2002   normal   DILATION AND CURETTAGE OF UTERUS  08-31-08   Dr [name]     IR [place] SEL COM CAROTID INNOMINATE BILAT MOD SED  09/18/2018   IR ANGIO VERTEBRAL SEL VERTEBRAL BILAT MOD SED  09/18/2018   IR US GUIDE VASC ACCESS RIGHT  09/18/2018   Social History   Social History Narrative   Exercise: trying to walk - limited by fatigue   Lives at home with husband and daughter   Right handed   Caffeine: occasional coke 1-2x per month   Immunization History  Administered Date(s) Administered   Influenza Split 04/10/2011, 06/18/2012, 05/06/2013   Influenza Whole 05/19/2008, 05/15/2009, 06/01/2010   Influenza,inj,Quad PF,6+ Mos 04/20/2014, 05/26/2015, 05/31/2016, 04/05/2017, 06/16/2018, 04/22/2019   Pneumococcal Conjugate-13 01/29/2017   Pneumococcal Polysaccharide-23 08/07/2007, 06/16/2018   Td 07/12/2009     Objective: Vital Signs: BP 135/77 (BP Location: Left Arm, Patient Position: Sitting, Cuff Size: Large)  Pulse 74    Resp 15    Ht 5' 8"  (1.727 m)    Wt 261 lb 6.4 oz (118.6 kg)    BMI 39.75 kg/m    Physical Exam Vitals signs and nursing note reviewed.  Constitutional:      Appearance: She is well-developed.  HENT:     Head: Normocephalic and atraumatic.  Eyes:     Conjunctiva/sclera: Conjunctivae normal.  Neck:     Musculoskeletal: Normal range of motion.  Cardiovascular:     Rate and Rhythm: Normal rate and regular rhythm.     Heart sounds: Normal heart sounds.  Pulmonary:     Effort: Pulmonary effort is normal.     Breath sounds: Normal breath sounds.  Abdominal:     General: Bowel sounds are normal.     Palpations: Abdomen is soft.  Lymphadenopathy:     Cervical: No cervical adenopathy.  Skin:    General: Skin is warm and dry.     Capillary Refill: Capillary refill takes less than 2 seconds.  Neurological:     Mental Status: She is alert and oriented to person, place, and time.  Psychiatric:        Behavior: Behavior normal.      Musculoskeletal Exam: C-spine, thoracic spine, lumbar spine good range of  motion.  No midline spinal tenderness.  No SI joint tenderness.  Shoulder joints, elbow joints, wrist joints, MCPs, PIPs, DIPs good range of motion with no synovitis.  She has complete fist formation bilaterally.  Hip joints, knee joints, ankle joints, MTPs, PIPs, DIPs good range of motion no synovitis.  No warmth or effusion of bilateral knee joints.  No tenderness or swelling of ankle joints.  CDAI Exam: CDAI Score: 0.6  Patient Global: 3 mm; Provider Global: 3 mm Swollen: 0 ; Tender: 0  Joint Exam   No joint exam has been documented for this visit   There is currently no information documented on the homunculus. Go to the Rheumatology activity and complete the homunculus joint exam.  Investigation: No additional findings.  Imaging: Xr Foot 2 Views Left  Result Date: 05/05/2019 First MTP, PIP and DIP narrowing was noted.  Juxta-articular osteopenia was noted.  No significant intertarsal tibiotalar joint space narrowing was noted.  No erosive changes were noted. Impression: These findings are consistent with osteoarthritis and rheumatoid arthritis overlap.  I could not find the old radiographs for comparison.  Xr Foot 2 Views Right  Result Date: 05/05/2019 PIP and DIP narrowing was noted.  Juxta-articular osteopenia was noted.  Erosion was noted over medial cuneiform.  No significant intertarsal or tibiotalar joint space narrowing was noted.  Small calcaneal spur was noted. Impression: These findings are consistent with osteoarthritis and rheumatoid arthritis overlap.  An erosion was noted over the medial cuneiform.  Xr Hand 2 View Left  Result Date: 05/05/2019 Juxta-articular osteopenia was noted.  First MCP narrowing, PIP and DIP narrowing was noted.  No significant metacarpocarpal or radiocarpal joint space narrowing was noted.  Intercarpal joint space narrowing was noted.  No erosive changes were noted. Impression: These findings are consistent with rheumatoid arthritis and  osteoarthritis overlap.  Xr Hand 2 View Right  Result Date: 05/05/2019 Narrowing of all MCP joints was noted.  Juxta-articular osteopenia was noted.  PIP and DIP narrowing was noted.  Intercarpal and radiocarpal joint space narrowing was noted.  Metacarpocarpal joint space narrowing was noted.  Few cystic changes were noted in the carpal bones. Impression: These findings are consistent  with rheumatoid arthritis and osteoarthritis overlap.  No radiographs are available for comparison.   Recent Labs: Lab Results  Component Value Date   WBC 6.7 04/06/2019   HGB 14.2 04/06/2019   PLT 309 04/06/2019   NA 140 04/06/2019   K 4.5 04/06/2019   CL 105 04/06/2019   CO2 26 04/06/2019   GLUCOSE 82 04/06/2019   BUN 11 04/06/2019   CREATININE 0.69 04/06/2019   BILITOT 0.6 04/06/2019   ALKPHOS 100 10/09/2018   AST 19 04/06/2019   ALT 16 04/06/2019   PROT 8.0 04/06/2019   ALBUMIN 4.6 10/09/2018   CALCIUM 10.3 04/06/2019   GFRAA 115 04/06/2019    Speciality Comments: Prior therapy includes: MTX (d/c due to ILD progresssion)  Procedures:  No procedures performed Allergies: Sulfonamide derivatives   Assessment / Plan:     Visit Diagnoses: Rheumatoid arthritis involving multiple sites with positive rheumatoid factor (HCC) -  +RF, +CCP: She has no active synovitis on examination today.  She reports that 1 month ago she was experiencing stiffness and difficulty making a complete fist in the right hand.  She states that she did not have any tenderness at that time but the stiffness lasted about 4 days.  She is not having any joint pain or joint swelling at this time.  X-rays of both hands and feet were obtained today to assess for interval change.  She seems to clinically be doing well on Imuran 100 mg by mouth daily.  She does not need any refills at this time.  She will continue on his current treatment regimen.  She was advised to notify us if she develops increased joint pain or joint swelling.  She  will follow-up in the office in 3 months.- Plan: XR Hand 2 View Right, XR Hand 2 View Left, XR Foot 2 Views Right, XR Foot 2 Views Left  Polymyositis ([place]) - Dx by Dr. Charlestine Night 2007, CK 801, Jo-1 positive: She has not had any signs or symptoms of a flareup recently.  She has no muscle weakness on examination today.  She had no difficulty getting up from a chair raising her arms above her head.  She has full strength of upper and lower extremities.  She is clinically doing well on Imuran 200 mg by mouth daily.  She was advised to notify us if she develops signs or symptoms of a flare.  High risk medication use  - Imuran 100 mg po daily.  Discontinue methotrexate due to further progression of ILD.  CBC and CMP were within normal limits on 04/06/2019.  She will return for lab work in November and every 3 months to monitor for drug toxicity.  She has had her seasonal influenza vaccination.  She has not had any recent infections.  Jo-1 antibody positive  ILD (interstitial lung disease) ([place]) -She continues to take Imuran 100 mg by mouth daily.  She uses Breo and albuterol as needed.  She has been checking her oxygen saturation with a pulse ox at home on a regular basis and monitor her blood pressure closely.  She uses 2 L of oxygen at night with her CPAP.  She has a chronic cough but it has been stable.  She continues to follow-up with [name].  Bronchiectasis without complication ([place]): She has a chronic cough but has not had any new or worsening symptoms.  She continues to use Breo and albuterol as needed.  She takes Mucinex daily.   Primary osteoarthritis of both hands: She  has mild osteoarthritic changes in both hands.  She has no tenderness or synovitis on exam.  Joint protection and muscle strengthening were discussed.  Primary osteoarthritis of both feet: She has no discomfort in her feet at this time.  She was proper fitting shoes.  Pedal edema noted.  Fibromyalgia: She has generalized hyperalgesia  and positive tender points on exam.  She has been having a fibromyalgia flare for the past several weeks.  She was diagnosed with costochondritis and was evaluated by her pulmonologist and her PCP.  She took 1 week of Aleve which subsided her symptoms.  According to the patient Dr. Quay Burow felt as though the costochondritis was due to fibromyalgia. She has a chronic cough but it has been stable and she denies any increased shortness of breath.  She has chronic fatigue related to insomnia.   Other medical conditions are listed as follows:   Erythema nodosum  History of migraine  Vitamin D deficiency  Fibromuscular dysplasia ([place])  History of mitral valve prolapse  Other fatigue  Orders: Orders Placed This Encounter  Procedures   XR Hand 2 View Right   XR Hand 2 View Left   XR Foot 2 Views Right   XR Foot 2 Views Left   No orders of the defined types were placed in this encounter.   Face-to-face time spent with patient was 30 minutes. Greater than 50% of time was spent in counseling and coordination of care.  Follow-Up Instructions: Return for Rheumatoid arthritis, Polymyositis, Fibromyalgia, Osteoarthritis.   Ofilia Neas, PA-C  Note - This record has been created using Dragon software.  Chart creation errors have been sought, but may not always  have been located. Such creation errors do not reflect on  the standard of medical care.

## 2019-04-22 NOTE — Assessment & Plan Note (Signed)
Taking aspirin 81 mg daily Taking telmisartan for blood pressure We discussed the importance of keeping her blood pressure well controlled and she will start monitoring at home so we can adjust her medications

## 2019-04-22 NOTE — Assessment & Plan Note (Signed)
?    Ideally controlled or not She will start monitoring her blood pressure and heart rate at home Can increase telmisartan if needed Can also add low-dose amlodipine, but will need to watch heart rate

## 2019-04-29 DIAGNOSIS — G4733 Obstructive sleep apnea (adult) (pediatric): Secondary | ICD-10-CM | POA: Diagnosis not present

## 2019-04-29 DIAGNOSIS — J471 Bronchiectasis with (acute) exacerbation: Secondary | ICD-10-CM | POA: Diagnosis not present

## 2019-05-05 ENCOUNTER — Ambulatory Visit: Payer: Self-pay

## 2019-05-05 ENCOUNTER — Other Ambulatory Visit: Payer: Self-pay

## 2019-05-05 ENCOUNTER — Ambulatory Visit: Payer: Federal, State, Local not specified - PPO | Admitting: Physician Assistant

## 2019-05-05 ENCOUNTER — Encounter: Payer: Self-pay | Admitting: Physician Assistant

## 2019-05-05 VITALS — BP 135/77 | HR 74 | Resp 15 | Ht 68.0 in | Wt 261.4 lb

## 2019-05-05 DIAGNOSIS — J479 Bronchiectasis, uncomplicated: Secondary | ICD-10-CM

## 2019-05-05 DIAGNOSIS — R768 Other specified abnormal immunological findings in serum: Secondary | ICD-10-CM | POA: Diagnosis not present

## 2019-05-05 DIAGNOSIS — M3320 Polymyositis, organ involvement unspecified: Secondary | ICD-10-CM | POA: Diagnosis not present

## 2019-05-05 DIAGNOSIS — M0579 Rheumatoid arthritis with rheumatoid factor of multiple sites without organ or systems involvement: Secondary | ICD-10-CM

## 2019-05-05 DIAGNOSIS — I773 Arterial fibromuscular dysplasia: Secondary | ICD-10-CM

## 2019-05-05 DIAGNOSIS — Z8669 Personal history of other diseases of the nervous system and sense organs: Secondary | ICD-10-CM

## 2019-05-05 DIAGNOSIS — Z79899 Other long term (current) drug therapy: Secondary | ICD-10-CM

## 2019-05-05 DIAGNOSIS — M19072 Primary osteoarthritis, left ankle and foot: Secondary | ICD-10-CM

## 2019-05-05 DIAGNOSIS — Z8679 Personal history of other diseases of the circulatory system: Secondary | ICD-10-CM

## 2019-05-05 DIAGNOSIS — M19042 Primary osteoarthritis, left hand: Secondary | ICD-10-CM

## 2019-05-05 DIAGNOSIS — E559 Vitamin D deficiency, unspecified: Secondary | ICD-10-CM

## 2019-05-05 DIAGNOSIS — M19041 Primary osteoarthritis, right hand: Secondary | ICD-10-CM | POA: Diagnosis not present

## 2019-05-05 DIAGNOSIS — M797 Fibromyalgia: Secondary | ICD-10-CM

## 2019-05-05 DIAGNOSIS — R5383 Other fatigue: Secondary | ICD-10-CM

## 2019-05-05 DIAGNOSIS — L52 Erythema nodosum: Secondary | ICD-10-CM

## 2019-05-05 DIAGNOSIS — M19071 Primary osteoarthritis, right ankle and foot: Secondary | ICD-10-CM

## 2019-05-05 DIAGNOSIS — J849 Interstitial pulmonary disease, unspecified: Secondary | ICD-10-CM

## 2019-05-05 NOTE — Patient Instructions (Signed)
Standing Labs We placed an order today for your standing lab work.    Please come back and get your standing labs in November and every 3 months  We have open lab daily Monday through Thursday from 8:30-12:30 PM and 1:30-4:30 PM and Friday from 8:30-12:30 PM and 1:30-4:00 PM at the office of Dr. [name] Deveshwar.   You may experience shorter wait times on Monday and Friday afternoons. The office is located at [address], [address], [place], [place] [address] No appointment is necessary.   Labs are drawn by [facility].  You may receive a bill from [facility] for your lab work.  If you wish to have your labs drawn at another location, please call the office 24 hours in advance to send orders.  If you have any questions regarding directions or hours of operation,  please call [phone].   Just as a reminder please drink plenty of water prior to coming for your lab work. Thanks!  

## 2019-05-06 ENCOUNTER — Other Ambulatory Visit: Payer: Self-pay

## 2019-05-06 ENCOUNTER — Encounter (INDEPENDENT_AMBULATORY_CARE_PROVIDER_SITE_OTHER): Payer: Self-pay | Admitting: Family Medicine

## 2019-05-06 ENCOUNTER — Telehealth (INDEPENDENT_AMBULATORY_CARE_PROVIDER_SITE_OTHER): Payer: Federal, State, Local not specified - PPO | Admitting: Family Medicine

## 2019-05-06 DIAGNOSIS — E8881 Metabolic syndrome: Secondary | ICD-10-CM

## 2019-05-06 DIAGNOSIS — Z6841 Body Mass Index (BMI) 40.0 and over, adult: Secondary | ICD-10-CM | POA: Diagnosis not present

## 2019-05-06 NOTE — Progress Notes (Signed)
Office: [phone]  /  Fax: [phone] TeleHealth Visit:  Jill Harper has verbally consented to this TeleHealth visit today. The patient is located at home, the provider is located at the [facility] office. The participants in this visit include the listed provider and patient and any and all parties involved. The visit was conducted today via telephone. Jill Harper was unable to use realtime audiovisual technology today (Doxy.me failed) and the telehealth visit was conducted via telephone.  HPI:   Chief Complaint: OBESITY Jill Harper is here to discuss her progress with her obesity treatment plan. She is on the Category 2 plan and is following her eating plan approximately 90 % of the time. She states she is exercising 0 minutes 0 times per week. Jill Harper feels she is doing well with weight loss and she has lost another 1 to 2 pounds. She states she weighed 257 pounds at home this morning. She is struggling to start exercising and she realizes she is feeling overwhelmed due to her "all or nothing" thinking and comparing herself to others. We were unable to weigh the patient today for this TeleHealth visit. She feels as if she has lost weight since her last visit. She has lost 1 lbs since starting treatment with [name].  Insulin Resistance Jill Harper has a diagnosis of insulin resistance based on her elevated fasting insulin level >5. Although Jill Harper's blood glucose readings are still under good control, insulin resistance puts her at greater risk of metabolic syndrome and diabetes. She is stable on metformin. Her fasting glucose was checked recently and was within normal limits. Jill Harper continues to work on diet and exercise to decrease the risk of diabetes. She denies nausea, vomiting or hypoglycemia.  ASSESSMENT AND PLAN:  Insulin resistance  Class 3 severe obesity with serious comorbidity and body mass index (BMI) of 40.0 to 44.9 in adult, unspecified obesity type ([place])  PLAN:  Insulin Resistance  Jill Harper will continue to work on weight loss, exercise, and decreasing simple carbohydrates in her diet to help decrease the risk of diabetes. We dicussed metformin including benefits and risks. She was informed that eating too many simple carbohydrates or too many calories at one sitting increases the likelihood of GI side effects. Jill Harper agreed to continue metformin 500 mg two times daily with a meal #60 with no refills. Jill Harper agreed to follow up with [name] as directed to monitor her progress.  I spent > than 50% of the 25 minute visit on counseling as documented in the note.  Obesity Jill Harper is currently in the action stage of change. As such, her goal is to continue with weight loss efforts She has agreed to follow the Category 2 plan Jill Harper has been instructed to work up to a goal of 150 minutes of combined cardio and strengthening exercise per week for weight loss and overall health benefits. We discussed the following Behavioral Modification Strategies today: increasing lean protein intake, decreasing simple carbohydrates and work on meal planning and easy cooking plans Jill Harper will start with small amounts of exercise most days and we will follow.  Jill Harper has agreed to follow up with our clinic in 3 weeks. She was informed of the importance of frequent follow up visits to maximize her success with intensive lifestyle modifications for her multiple health conditions.  ALLERGIES: Allergies  Allergen Reactions  . Sulfonamide Derivatives     REACTION: hives    MEDICATIONS: Current Outpatient Medications on File Prior to Visit  Medication Sig Dispense Refill  .  albuterol (PROVENTIL HFA) 108 (90 Base) MCG/ACT inhaler Inhale 2 puffs into the lungs 4 (four) times daily as needed. Reported on 09/03/2015 1 Inhaler 4  . albuterol (PROVENTIL) (2.5 MG/3ML) 0.083% nebulizer solution Take 3 mLs (2.5 mg total) by nebulization every 6 (six) hours as needed for wheezing or shortness of breath. 120 vial 5  . aspirin EC  81 MG tablet Take 81 mg by mouth daily.    [name] atenolol (TENORMIN) 25 MG tablet Take 0.5 tablets (12.5 mg total) by mouth daily as needed (mitral prolapse). 45 tablet 0  . azaTHIOprine (IMURAN) 50 MG tablet TAKE 2 TABLETS(100 MG) BY MOUTH DAILY 60 tablet 2  . BREO ELLIPTA 100-25 MCG/INH AEPB INHALE 1 PUFF INTO THE LUNGS DAILY 60 each 5  . eletriptan (RELPAX) 20 MG tablet One tablet by mouth at onset of headache. May repeat in 2 hours if headache persists or recurs. 10 tablet 0  . folic acid (FOLVITE) 1 MG tablet TAKE 2 TABLETS(2 MG) BY MOUTH DAILY 180 tablet 3  . guaiFENesin (MUCINEX) 600 MG 12 hr tablet Take 2 tablets (1,200 mg total) by mouth 2 (two) times daily as needed for cough or to loosen phlegm. (Patient taking differently: Take 1,200 mg by mouth 2 (two) times daily. )    . metFORMIN (GLUCOPHAGE) 500 MG tablet Take 1 tablet (500 mg total) by mouth 2 (two) times daily with a meal. 60 tablet 0  . Multiple Vitamins-Minerals (MULTIVITAMIN GUMMIES ADULT PO) Take 2 each by mouth daily.    [name] Respiratory Therapy Supplies (FLUTTER) DEVI Use as directed (Patient taking differently: 2 (two) times daily. Use as directed) 1 each 0  . telmisartan (MICARDIS) 20 MG tablet Take 1 tablet (20 mg total) by mouth daily. 30 tablet 5   No current facility-administered medications on file prior to visit.     PAST MEDICAL HISTORY: Past Medical History:  Diagnosis Date  . Abnormal liver function test   . Bronchiectasis       . Dyspnea   . Fibromuscular dysplasia ([place])   . Fibromyalgia   . Heart murmur   . ILD (interstitial lung disease) ([place])   . Joint pain   . Menorrhagia   . MIGRAINE HEADACHE   . MVP (mitral valve prolapse)   . Osteoarthritis   . Polymyositis ([place])    Dr [name]; chronic MTX  . Pulmonary fibrosis ([place])   . Rheumatoid arthritis ([place])   . Sleep apnea   . Vitamin B 12 deficiency   . Vitamin D deficiency     PAST SURGICAL HISTORY: Past Surgical History:  Procedure  Laterality Date  . ablation uterine  2010  . CARDIAC CATHETERIZATION  2002   normal  . DILATION AND CURETTAGE OF UTERUS  08-31-08   Dr [name]  . IR ANGIO INTRA EXTRACRAN SEL COM CAROTID INNOMINATE BILAT MOD SED  09/18/2018  . IR ANGIO VERTEBRAL SEL VERTEBRAL BILAT MOD SED  09/18/2018  . IR US GUIDE VASC ACCESS RIGHT  09/18/2018    SOCIAL HISTORY: Social History   Tobacco Use  . Smoking status: Never Smoker  . Smokeless tobacco: Never Used  . Tobacco comment: Married, lives with spouse. 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 Alcohol use: Never    Frequency: Never    Comment: maybe a wine cooler once every other year  . Drug use: Never    FAMILY HISTORY: Family History  Problem  Relation Age of Onset  . Diabetes Mother   . Fibromyalgia Mother   . Ulcers Mother   . Heart failure Mother   . Thyroid disease Mother   . Obesity Mother   . Multiple myeloma Father   . Hypertension Father   . Stroke Father   . Lupus Sister   . Other Sister        abdominal adhesions resulting in bowel obstruction  . Migraines Sister   . Heart disease Brother        bypass surgery  . Rheum arthritis Sister   . Multiple myeloma Sister   . Hypertension Sister   . Heart attack Sister   . Diabetes Sister   . Hypertension Sister   . Rheum arthritis Sister   . Diabetes Sister   . Diabetes Brother   . Headache Other        siblings with headaches but not diagnosed as migraines    ROS: Review of Systems  Constitutional: Positive for weight loss.  Gastrointestinal: Negative for nausea and vomiting.  Endo/Heme/Allergies:       Negative for hypoglycemia    PHYSICAL EXAM: Pt in no acute distress  RECENT LABS AND TESTS: BMET    Component Value Date/Time   NA 140 04/06/2019 1551   NA 144 10/09/2018 1300   K 4.5 04/06/2019 1551   CL 105 04/06/2019 1551   CO2 26 04/06/2019 1551   GLUCOSE 82 04/06/2019 1551   BUN 11 04/06/2019  1551   BUN 7 10/09/2018 1300   CREATININE 0.69 04/06/2019 1551   CALCIUM 10.3 04/06/2019 1551   GFRNONAA 99 04/06/2019 1551   GFRAA 115 04/06/2019 1551   Lab Results  Component Value Date   HGBA1C 5.5 10/09/2018   HGBA1C 4.8 04/24/2016   Lab Results  Component Value Date   INSULIN 22.4 10/09/2018   CBC    Component Value Date/Time   WBC 6.7 04/06/2019 1551   RBC 5.06 04/06/2019 1551   HGB 14.2 04/06/2019 1551   HCT 43.0 04/06/2019 1551   PLT 309 04/06/2019 1551   MCV 85.0 04/06/2019 1551   MCH 28.1 04/06/2019 1551   MCHC 33.0 04/06/2019 1551   RDW 13.2 04/06/2019 1551   LYMPHSABS 1,353 04/06/2019 1551   MONOABS 666 01/31/2017 1006   EOSABS 107 04/06/2019 1551   BASOSABS 27 04/06/2019 1551   Iron/TIBC/Ferritin/ %Sat No results found for: IRON, TIBC, FERRITIN, IRONPCTSAT Lipid Panel     Component Value Date/Time   CHOL 141 10/09/2018 1300   TRIG 116 10/09/2018 1300   HDL 44 10/09/2018 1300   CHOLHDL 3 04/24/2016 0900   VLDL 11.8 04/24/2016 0900   LDLCALC 74 10/09/2018 1300   Hepatic Function Panel     Component Value Date/Time   PROT 8.0 04/06/2019 1551   PROT 7.8 10/09/2018 1300   ALBUMIN 4.6 10/09/2018 1300   AST 19 04/06/2019 1551   ALT 16 04/06/2019 1551   ALKPHOS 100 10/09/2018 1300   BILITOT 0.6 04/06/2019 1551   BILITOT 0.6 10/09/2018 1300   BILIDIR 0.1 04/03/2018 1457   IBILI 0.3 04/03/2018 1457      Component Value Date/Time   TSH 3.24 10/01/2018 1452   TSH 5.28 (H) 03/18/2018 1642   TSH 4.82 (H) 02/18/2018 1609    Results for MAHEK, SCHLESINGER (MRN [id number]) as of 05/06/2019 12:54  Ref. Range 01/01/2019 15:35  Vitamin D, 25-Hydroxy Latest Ref Range: 30 - 100 ng/mL 45    I, Doreene Nest, am  acting as transcriptionist for Dennard Nip, MD I have reviewed the above documentation for accuracy and completeness, and I agree with the above. -Dennard Nip, MD

## 2019-05-07 DIAGNOSIS — J471 Bronchiectasis with (acute) exacerbation: Secondary | ICD-10-CM | POA: Diagnosis not present

## 2019-05-07 DIAGNOSIS — G4733 Obstructive sleep apnea (adult) (pediatric): Secondary | ICD-10-CM | POA: Diagnosis not present

## 2019-05-07 IMAGING — CT CT HEART MORP W/ CTA COR W/ SCORE W/ CA W/CM &/OR W/O CM
4 of 7 series · 8 of 20 positions shown, 9 images · non-contrast
Comparison: CT of the chest on 04/15/2018

Addendum:
EXAM:
OVER-READ INTERPRETATION  CT CHEST

The following report is an over-read performed by radiologist Dr.
Lackoo Chukeir [REDACTED] on 07/18/2018. This
over-read does not include interpretation of cardiac or coronary
anatomy or pathology. The coronary CTA interpretation by the
cardiologist is attached.
CLINICAL DATA: 52-year-old female with h/o obesity and atypical
chest pain.
Cardiac/Coronary  CT
TECHNIQUE: The patient was scanned on a Phillips Force scanner.

[Series 7: best diast 76 % · axial · 0.35mm/px · z∈[+1216,+1259]mm · 2 of 321 slices shown, 3 images]
[im 107/321  vessel]
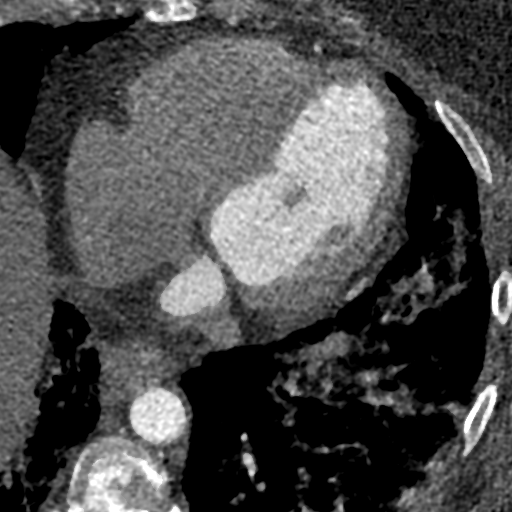
[im 107/321  lung]
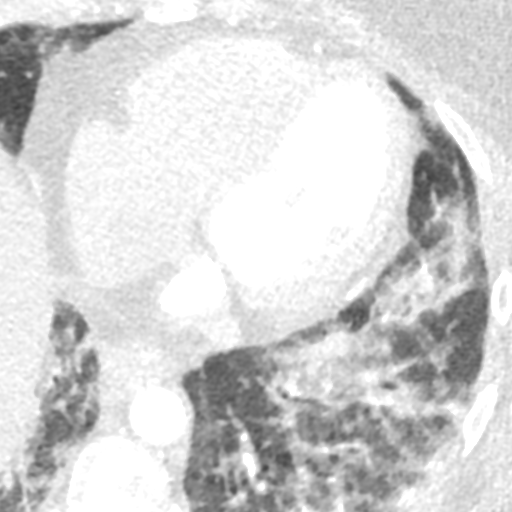
[im 214/321  vessel]
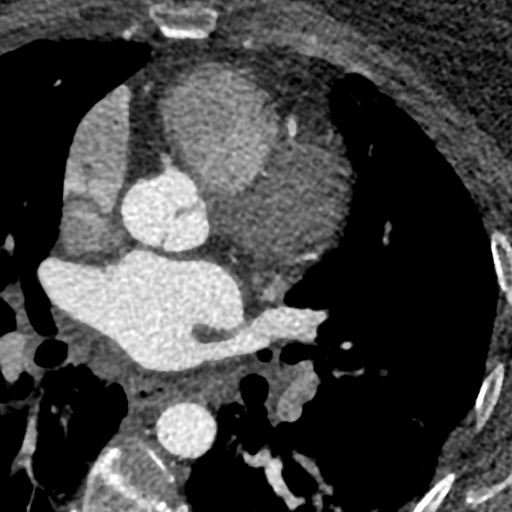

[Series 8: best syst 36 % · axial · 0.35mm/px · z∈[+1216,+1259]mm · 2 of 321 slices shown]
[im 107/321  vessel]
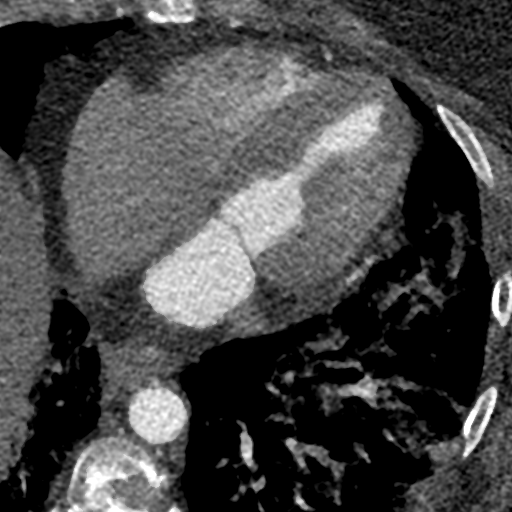
[im 214/321  vessel]
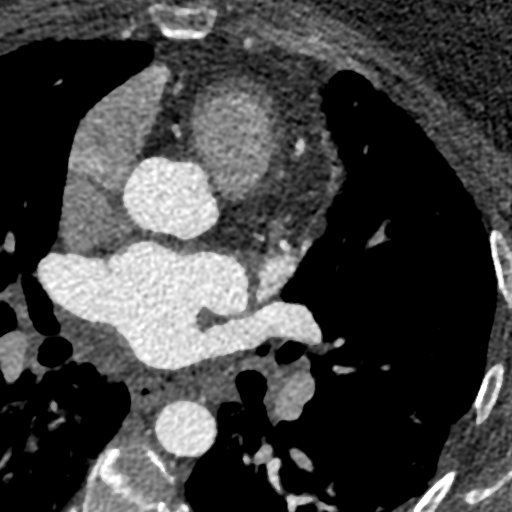

[Series 9: ts diast sharp 76 % · axial · 0.35mm/px · z∈[+1216,+1259]mm · 2 of 321 slices shown]
[im 107/321  lung]
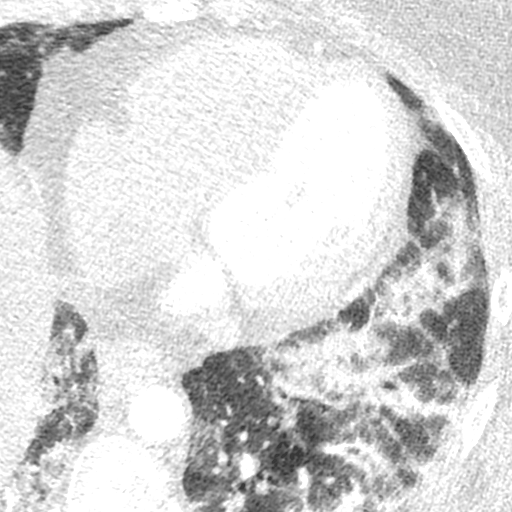
[im 214/321  lung]
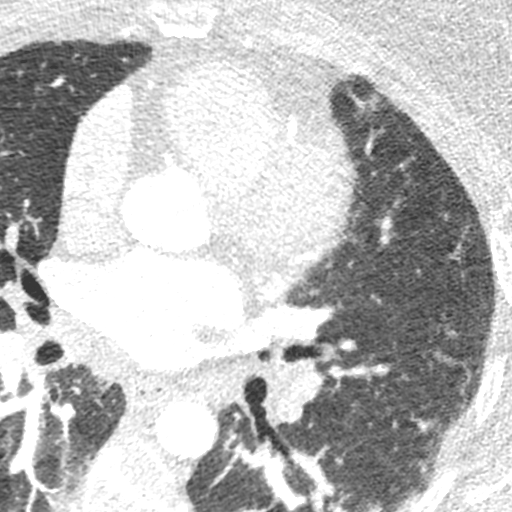

[Series 10: ts syst sharp 36 % · axial · 0.35mm/px · z∈[+1216,+1259]mm · 2 of 321 slices shown]
[im 107/321  lung]
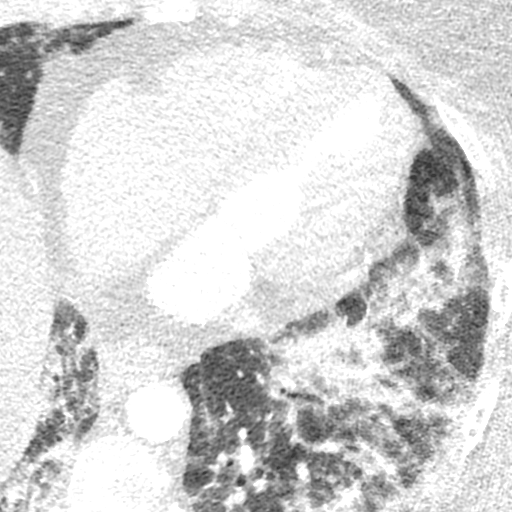
[im 214/321  lung]
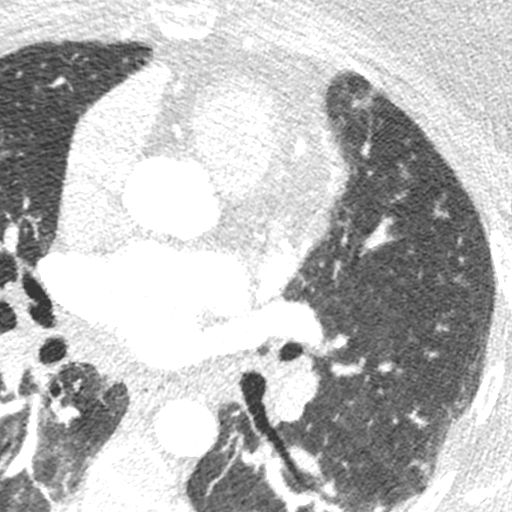

[8 of 20 positions shown; findings below may reference images not displayed]

FINDINGS: Vascular: No incidental findings.

Mediastinum/Nodes: Stable small nonenlarged mediastinal lymph nodes.

Lungs/Pleura: Stable fibrotic interstitial lung disease,
predominantly in the lower lung zones. No overt pulmonary edema,
focal airspace disease, pneumothorax or pleural fluid.

Upper Abdomen: Small hiatal hernia.

Musculoskeletal: No chest wall mass or suspicious bone lesions
identified.
IMPRESSION: Stable fibrotic interstitial lung disease.  Small hiatal hernia.
FINDINGS: A 120 kV prospective scan was triggered in the descending thoracic
aorta at 111 HU's. Axial non-contrast 3 mm slices were carried out
through the heart. The data set was analyzed on a dedicated work
station and scored using the Agatson method. Gantry rotation speed
was 250 msecs and collimation was .6 mm. No beta blockade and 0.8 mg
of sl NTG was given. The 3D data set was reconstructed in 5%
intervals of the 67-82 % of the R-R cycle. Diastolic phases were
analyzed on a dedicated work station using MPR, MIP and VRT modes.
The patient received 80 cc of contrast.

Aorta:  Normal size.  No calcifications.  No dissection.

Aortic Valve:  Trileaflet.  No calcifications.

Coronary Arteries:  Normal coronary origin.  Left dominance.

Left main is a large and long artery that gives rise to LAD and LCX
arteries. There is no plaque in the left main.

LAD is a large vessel that wraps around the apex and supplies
majority of the PDA territory. There is no plaque.

LCX is a large dominant artery that gives rise to two OM branches
and a small PDA. PLA is not visualized. There is no plaque

RCA is a very small non-dominant artery.

Other findings:

Normal pulmonary vein drainage into the left atrium.

Normal let atrial appendage without a thrombus.

Normal size of the pulmonary artery.
IMPRESSION: 1. Coronary calcium score of 0. This was 0 percentile for age and
sex matched control.

2. Normal coronary origin with left dominance.

3. This study quality is affected by patient's size, however there
is no evidence of CAD.

*** End of Addendum ***

## 2019-05-19 NOTE — Addendum Note (Signed)
Encounter addended by: Lance Morin, RN on: 05/19/2019 4:26 PM  Actions taken: Clinical Note Signed, Episode resolved

## 2019-05-19 NOTE — Progress Notes (Signed)
Discharge Progress Report  Patient Details  Name: Jill Harper MRN: [id number] Date of Birth: 27-Apr-1966 Referring Provider:     Pulmonary Rehab Walk Test from 10/14/2018 in [place]  Referring Provider  Dr. Valeta Harms       Number of Visits: 0 Reason for Discharge:  Early Exit:  due to department closure for COVID-19  Smoking History:  Social History   Tobacco Use  Smoking Status Never Smoker  Smokeless Tobacco Never Used  Tobacco Comment   Married, lives with spouse. 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    Diagnosis:  ILD (interstitial lung disease) ([place])  ADL UCSD:   Initial Exercise Prescription:   Discharge Exercise Prescription (Final Exercise Prescription Changes):   Functional Capacity:   Psychological, QOL, Others - Outcomes: PHQ 2/9: Depression screen [facility] 2/9 10/13/2018 10/09/2018 04/05/2017  Decreased Interest 0 3 0  Down, Depressed, Hopeless 0 3 0  PHQ - 2 Score 0 6 0  Altered sleeping 3 3 -  Tired, decreased energy 3 3 -  Change in appetite 1 1 -  Feeling bad or failure about yourself  0 3 -  Trouble concentrating 0 0 -  Moving slowly or fidgety/restless 3 3 -  Suicidal thoughts 0 0 -  PHQ-9 Score 10 19 -  Difficult doing work/chores Somewhat difficult Not difficult at all -  Some recent data might be hidden    Quality of Life:   Personal Goals: Goals established at orientation with interventions provided to work toward goal.    Personal Goals Discharge:   Exercise Goals and Review:   Exercise Goals Re-Evaluation:   Nutrition & Weight - Outcomes:    Nutrition:   Nutrition Discharge:   Education Questionnaire Score:   Goals reviewed with patient; copy given to patient.

## 2019-05-21 ENCOUNTER — Other Ambulatory Visit (INDEPENDENT_AMBULATORY_CARE_PROVIDER_SITE_OTHER): Payer: Self-pay | Admitting: Family Medicine

## 2019-05-21 DIAGNOSIS — E8881 Metabolic syndrome: Secondary | ICD-10-CM

## 2019-05-22 ENCOUNTER — Other Ambulatory Visit: Payer: Self-pay | Admitting: Internal Medicine

## 2019-05-27 ENCOUNTER — Encounter (INDEPENDENT_AMBULATORY_CARE_PROVIDER_SITE_OTHER): Payer: Self-pay | Admitting: Family Medicine

## 2019-05-27 ENCOUNTER — Telehealth (INDEPENDENT_AMBULATORY_CARE_PROVIDER_SITE_OTHER): Payer: Federal, State, Local not specified - PPO | Admitting: Family Medicine

## 2019-05-27 ENCOUNTER — Other Ambulatory Visit: Payer: Self-pay

## 2019-05-27 DIAGNOSIS — Z6841 Body Mass Index (BMI) 40.0 and over, adult: Secondary | ICD-10-CM | POA: Diagnosis not present

## 2019-05-27 DIAGNOSIS — E8881 Metabolic syndrome: Secondary | ICD-10-CM | POA: Diagnosis not present

## 2019-05-27 MED ORDER — METFORMIN HCL 500 MG PO TABS: 500.0000 mg | ORAL_TABLET | Freq: Two times a day (BID) | ORAL | 0 refills | Status: DC

## 2019-05-28 NOTE — Progress Notes (Signed)
Office: [phone]  /  Fax: [phone] TeleHealth Visit:  Jill Harper has verbally consented to this TeleHealth visit today. The patient is located at home, the provider is located at the [facility] office. The participants in this visit include the listed provider and patient. Jill Harper was unable to use realtime audiovisual technology today and the telehealth visit was conducted via telephone.   HPI:   Chief Complaint: OBESITY Jill Harper is here to discuss her progress with her obesity treatment plan. She is on the Category 2 plan and is following her eating plan approximately 97-98 % of the time. She states she is doing some walking for 10-15 minutes 2 times per week. Jill Harper continues to do well with weight loss, and thinks she has lost another 3 lbs in the last 3 weeks. She has started to do another exercise on her treadmill.  We were unable to weigh the patient today for this TeleHealth visit. She feels as if she has lost 3 lbs since her last visit. She has lost 1-4 lbs since starting treatment with [name].  Insulin Resistance Jill Harper has a diagnosis of insulin resistance based on her elevated fasting insulin level >5. Although Jill Harper's blood glucose readings are still under good control, insulin resistance puts her at greater risk of metabolic syndrome and diabetes. She had ran out of metformin, and noted increase polyphagia and had an episode of hypoglycemia. She continues to work on diet and exercise to decrease risk of diabetes.  ASSESSMENT AND PLAN:  Insulin resistance - Plan: metFORMIN (GLUCOPHAGE) 500 MG tablet  Class 3 severe obesity with serious comorbidity and body mass index (BMI) of 40.0 to 44.9 in adult, unspecified obesity type ([place])  PLAN:  Insulin Resistance Jill Harper will continue to work on weight loss, exercise, and decreasing simple carbohydrates in her diet to help decrease the risk of diabetes. We dicussed metformin including benefits and risks. She was informed that  eating too many simple carbohydrates or too many calories at one sitting increases the likelihood of GI side effects. Jill Harper agrees to continue taking metformin 500 mg PO BID #60 and we will refill for 1 month. [name] agrees to follow up with our clinic in 3 weeks as directed to monitor her progress.  Obesity Jill Harper is currently in the action stage of change. As such, her goal is to continue with weight loss efforts She has agreed to follow the Category 2 plan Jill Harper has been instructed to work up to a goal of 150 minutes of combined cardio and strengthening exercise per week for weight loss and overall health benefits. We discussed the following Behavioral Modification Strategies today: increasing lean protein intake and decreasing simple carbohydrates    Jill Harper has agreed to follow up with our clinic in 3 weeks. She was informed of the importance of frequent follow up visits to maximize her success with intensive lifestyle modifications for her multiple health conditions.  ALLERGIES: Allergies  Allergen Reactions  . Sulfonamide Derivatives     REACTION: hives    MEDICATIONS: Current Outpatient Medications on File Prior to Visit  Medication Sig Dispense Refill  . albuterol (PROVENTIL HFA) 108 (90 Base) MCG/ACT inhaler Inhale 2 puffs into the lungs 4 (four) times daily as needed. Reported on 09/03/2015 1 Inhaler 4  . albuterol (PROVENTIL) (2.5 MG/3ML) 0.083% nebulizer solution Take 3 mLs (2.5 mg total) by nebulization every 6 (six) hours as needed for wheezing or shortness of breath. 120 vial 5  . aspirin EC 81 MG  tablet Take 81 mg by mouth daily.    [name] atenolol (TENORMIN) 25 MG tablet Take 0.5 tablets (12.5 mg total) by mouth daily as needed (mitral prolapse). 45 tablet 0  . azaTHIOprine (IMURAN) 50 MG tablet TAKE 2 TABLETS(100 MG) BY MOUTH DAILY 60 tablet 2  . BREO ELLIPTA 100-25 MCG/INH AEPB INHALE 1 PUFF INTO THE LUNGS DAILY 60 each 5  . eletriptan (RELPAX) 20 MG tablet One tablet by mouth at  onset of headache. May repeat in 2 hours if headache persists or recurs. 10 tablet 0  . folic acid (FOLVITE) 1 MG tablet TAKE 2 TABLETS(2 MG) BY MOUTH DAILY 180 tablet 3  . guaiFENesin (MUCINEX) 600 MG 12 hr tablet Take 2 tablets (1,200 mg total) by mouth 2 (two) times daily as needed for cough or to loosen phlegm. (Patient taking differently: Take 1,200 mg by mouth 2 (two) times daily. )    . Multiple Vitamins-Minerals (MULTIVITAMIN GUMMIES ADULT PO) Take 2 each by mouth daily.    [name] Respiratory Therapy Supplies (FLUTTER) DEVI Use as directed (Patient taking differently: 2 (two) times daily. Use as directed) 1 each 0  . telmisartan (MICARDIS) 20 MG tablet TAKE 1 TABLET(20 MG) BY MOUTH DAILY 90 tablet 1   No current facility-administered medications on file prior to visit.     PAST MEDICAL HISTORY: Past Medical History:  Diagnosis Date  . Abnormal liver function test   . Bronchiectasis       . Dyspnea   . Fibromuscular dysplasia ([place])   . Fibromyalgia   . Heart murmur   . ILD (interstitial lung disease) ([place])   . Joint pain   . Menorrhagia   . MIGRAINE HEADACHE   . MVP (mitral valve prolapse)   . Osteoarthritis   . Polymyositis ([place])    Dr [name]; chronic MTX  . Pulmonary fibrosis ([place])   . Rheumatoid arthritis ([place])   . Sleep apnea   . Vitamin B 12 deficiency   . Vitamin D deficiency     PAST SURGICAL HISTORY: Past Surgical History:  Procedure Laterality Date  . ablation uterine  2010  . CARDIAC CATHETERIZATION  2002   normal  . DILATION AND CURETTAGE OF UTERUS  08-31-08   Dr [name]  . IR ANGIO INTRA EXTRACRAN SEL COM CAROTID INNOMINATE BILAT MOD SED  09/18/2018  . IR ANGIO VERTEBRAL SEL VERTEBRAL BILAT MOD SED  09/18/2018  . IR US GUIDE VASC ACCESS RIGHT  09/18/2018    SOCIAL HISTORY: Social History   Tobacco Use  . Smoking status: Never Smoker  . Smokeless tobacco: Never Used  . Tobacco comment: Married, lives with spouse. 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 Alcohol use: Never    Frequency: Never    Comment: maybe a wine cooler once every other year  . Drug use: Never    FAMILY HISTORY: Family History  Problem Relation Age of Onset  . Diabetes Mother   . Fibromyalgia Mother   . Ulcers Mother   . Heart failure Mother   . Thyroid disease Mother   . Obesity Mother   . Multiple myeloma Father   . Hypertension Father   . Stroke Father   . Lupus Sister   . Other Sister        abdominal adhesions resulting in bowel obstruction  . Migraines Sister   . Heart disease Brother  bypass surgery  . Rheum arthritis Sister   . Multiple myeloma Sister   . Hypertension Sister   . Heart attack Sister   . Diabetes Sister   . Hypertension Sister   . Rheum arthritis Sister   . Diabetes Sister   . Diabetes Brother   . Headache Other        siblings with headaches but not diagnosed as migraines    ROS: Review of Systems  Constitutional: Positive for weight loss.  Endo/Heme/Allergies:       Positive polyphagia Positive hypoglycemia    PHYSICAL EXAM: Pt in no acute distress  RECENT LABS AND TESTS: BMET    Component Value Date/Time   NA 140 04/06/2019 1551   NA 144 10/09/2018 1300   K 4.5 04/06/2019 1551   CL 105 04/06/2019 1551   CO2 26 04/06/2019 1551   GLUCOSE 82 04/06/2019 1551   BUN 11 04/06/2019 1551   BUN 7 10/09/2018 1300   CREATININE 0.69 04/06/2019 1551   CALCIUM 10.3 04/06/2019 1551   GFRNONAA 99 04/06/2019 1551   GFRAA 115 04/06/2019 1551   Lab Results  Component Value Date   HGBA1C 5.5 10/09/2018   HGBA1C 4.8 04/24/2016   Lab Results  Component Value Date   INSULIN 22.4 10/09/2018   CBC    Component Value Date/Time   WBC 6.7 04/06/2019 1551   RBC 5.06 04/06/2019 1551   HGB 14.2 04/06/2019 1551   HCT 43.0 04/06/2019 1551   PLT 309 04/06/2019 1551   MCV 85.0 04/06/2019 1551   MCH 28.1 04/06/2019 1551   MCHC 33.0 04/06/2019 1551    RDW 13.2 04/06/2019 1551   LYMPHSABS 1,353 04/06/2019 1551   MONOABS 666 01/31/2017 1006   EOSABS 107 04/06/2019 1551   BASOSABS 27 04/06/2019 1551   Iron/TIBC/Ferritin/ %Sat No results found for: IRON, TIBC, FERRITIN, IRONPCTSAT Lipid Panel     Component Value Date/Time   CHOL 141 10/09/2018 1300   TRIG 116 10/09/2018 1300   HDL 44 10/09/2018 1300   CHOLHDL 3 04/24/2016 0900   VLDL 11.8 04/24/2016 0900   LDLCALC 74 10/09/2018 1300   Hepatic Function Panel     Component Value Date/Time   PROT 8.0 04/06/2019 1551   PROT 7.8 10/09/2018 1300   ALBUMIN 4.6 10/09/2018 1300   AST 19 04/06/2019 1551   ALT 16 04/06/2019 1551   ALKPHOS 100 10/09/2018 1300   BILITOT 0.6 04/06/2019 1551   BILITOT 0.6 10/09/2018 1300   BILIDIR 0.1 04/03/2018 1457   IBILI 0.3 04/03/2018 1457      Component Value Date/Time   TSH 3.24 10/01/2018 1452   TSH 5.28 (H) 03/18/2018 1642   TSH 4.82 (H) 02/18/2018 1609      I, Trixie Dredge, am acting as transcriptionist for Dennard Nip, MD I have reviewed the above documentation for accuracy and completeness, and I agree with the above. -Dennard Nip, MD

## 2019-05-29 DIAGNOSIS — J471 Bronchiectasis with (acute) exacerbation: Secondary | ICD-10-CM | POA: Diagnosis not present

## 2019-05-29 DIAGNOSIS — G4733 Obstructive sleep apnea (adult) (pediatric): Secondary | ICD-10-CM | POA: Diagnosis not present

## 2019-06-05 ENCOUNTER — Other Ambulatory Visit: Payer: Self-pay

## 2019-06-05 MED ORDER — ATENOLOL 25 MG PO TABS: 12.5000 mg | ORAL_TABLET | Freq: Every day | ORAL | 0 refills | Status: DC | PRN

## 2019-06-07 DIAGNOSIS — J471 Bronchiectasis with (acute) exacerbation: Secondary | ICD-10-CM | POA: Diagnosis not present

## 2019-06-07 DIAGNOSIS — G4733 Obstructive sleep apnea (adult) (pediatric): Secondary | ICD-10-CM | POA: Diagnosis not present

## 2019-06-08 ENCOUNTER — Ambulatory Visit: Payer: Federal, State, Local not specified - PPO | Admitting: Neurology

## 2019-06-08 ENCOUNTER — Telehealth: Payer: Self-pay | Admitting: Neurology

## 2019-06-08 ENCOUNTER — Encounter: Payer: Self-pay | Admitting: Neurology

## 2019-06-08 ENCOUNTER — Other Ambulatory Visit: Payer: Self-pay

## 2019-06-08 ENCOUNTER — Telehealth: Payer: Self-pay | Admitting: *Deleted

## 2019-06-08 VITALS — BP 117/69 | HR 73 | Temp 98.1°F | Ht 68.0 in | Wt 255.0 lb

## 2019-06-08 DIAGNOSIS — I7771 Dissection of carotid artery: Secondary | ICD-10-CM | POA: Diagnosis not present

## 2019-06-08 DIAGNOSIS — G43711 Chronic migraine without aura, intractable, with status migrainosus: Secondary | ICD-10-CM | POA: Diagnosis not present

## 2019-06-08 DIAGNOSIS — R29898 Other symptoms and signs involving the musculoskeletal system: Secondary | ICD-10-CM

## 2019-06-08 DIAGNOSIS — R200 Anesthesia of skin: Secondary | ICD-10-CM

## 2019-06-08 DIAGNOSIS — M5412 Radiculopathy, cervical region: Secondary | ICD-10-CM

## 2019-06-08 DIAGNOSIS — I773 Arterial fibromuscular dysplasia: Secondary | ICD-10-CM | POA: Diagnosis not present

## 2019-06-08 MED ORDER — AJOVY 225 MG/1.5ML ~~LOC~~ SOAJ: 225.0000 mg | SUBCUTANEOUS | 11 refills | Status: DC

## 2019-06-08 NOTE — Telephone Encounter (Signed)
error 

## 2019-06-08 NOTE — Telephone Encounter (Signed)
[employer] fed order sent to GI. No auth they will reach out to the patient to schedule.  °

## 2019-06-08 NOTE — Progress Notes (Signed)
[facility]    Provider:  Dr Jaynee Eagles Referring Provider: Binnie Rail, MD, Lauraine Rinne NP Primary Care Physician:  Binnie Rail, MD Lauraine Rinne NP  CC:  Headache,FMD   Interval history 06/18/2019: Carotid arteries on CTA c/w FMD. Discussed Fibromuscular Dysplasia, sent to Dr. Estanislado Pandy and being followed there. She has sharp pain in the back of the head, stops her because it is so severe, so painful it stops her in her tracks. Leaves her weak afterwards. Can go weeks without it. It is random, unknown what triggers, brief, severe. Also continues to have migraines, left sided, pulsating, pounding, contsant pain, behind the eye, feels she may get ptosis, no lacrimation or rhinorrhea. She has light and sound sensitivity. She has daily headaches, no medication overuse, no aura. She has 8 migraine days a month with moderate or severe quality and can last 24-72 hours ongoing for > year at this frequency and severity and quality. For FMD and BO meds and a daily asa. Tried atenolol, relpax, telmisartan, verapamil, nortriptyline, topamax. She has some brain fog, discussed normal aging, chronic pain and lifestyle. She has had neck pain, she has numbness and tingling in the left hand and arm, ongoing for >  Months, failed conservative measures, under the care of a physician.    HPI:  Jill Harper is a 53 y.o. female here as requested by Dr. Quay Burow for headaches. PMHx RA, bronchiectasis, polymyositis on imuran follows with Rheumatology, never smoked, interstitial lung disease, fibromyalgia, migraines, mitral valve prolapse, abnormal liver function test. She was supposed to have a sleep study but did not complete that (per notes [name]).  She reports she has headaches that come on during exertion and dissipate without medication when she slows down or alters activities. These are different from her usual migraines. She just had sleep test a few weeks ago, snoring, stops breathing. She does  not have the results. She wakes up at night feeling like she can't breathe. She wakes with with "new" headache in the setting of sleep problems for at least 5-6 months. She may have the same headache later in the day. The headache is a fogginedd across  the forehead and more on the left.She wakes with this headache in the setting if snoring and lung problems. The headache is worse with exertion, walking and talking, worse with valsalva and also with sex/orgasm. Rest helps and sitting still and it dissipates slowly. Can be moderately severe. Here with husband who provides much information.  Headache medications used: Atenolol, Flexeril, Relpax,  Reviewed notes, labs and imaging from outside physicians, which showed:  Personally reviewed images from 2013 and agree with the following   Reviewed [name] notes.  Last time she was seen was 07/28/2018.  Patient has a history of rheumatoid arthritis, polymyositis followed in rheumatology.  Also bronchiectasis.  She was supposed to have a sleep study that was previously ordered but she did not.  Epworth Sleepiness Scale 7 06/16/2018.  ANA negative, rheumatoid factor XX 9, anti-CCP 213, SCL 70, SSA SSB negative, Jo 1+.  Patient's last echocardiogram 05/29/2017 with ejection fraction 60 to 65%, moderate left ventricular hypertrophy, grade 1 DD.  CT of the sinuses were negative for acute disease.   Called Dr. Juanetta Gosling office and requested results of sleep testing  Review of Systems: Patient complains of symptoms per HPI as well as the following symptoms: Headache, snoring, fatigue, shortness of breath, cough, wheezing, snoring, murmur, joint pain, joint swelling, cramps,  aching muscles. Pertinent negatives and positives per HPI. All others negative.   Social History   Socioeconomic History   Marital status: Married    Spouse name: Tatyana Biber   Number of children: 1   Years of education: Not on file   Highest education level: High school graduate    Occupational History   Occupation: Compliant and Injury Education officer, museum strain: Not on file   Food insecurity    Worry: Not on file    Inability: Not on file   Transportation needs    Medical: Not on file    Non-medical: Not on file  Tobacco Use   Smoking status: Never Smoker   Smokeless tobacco: Never Used   Tobacco comment: Married, lives with spouse. 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  Substance and Sexual Activity   Alcohol use: Never    Frequency: Never    Comment: maybe a wine cooler once every other year   Drug use: Never   Sexual activity: Yes    Birth control/protection: Surgical  Lifestyle   Physical activity    Days per week: Not on file    Minutes per session: Not on file   Stress: Not on file  Relationships   Social connections    Talks on phone: Not on file    Gets together: Not on file    Attends religious service: Not on file    Active member of club or organization: Not on file    Attends meetings of clubs or organizations: Not on file    Relationship status: Not on file   Intimate partner violence    Fear of current or ex partner: Not on file    Emotionally abused: Not on file    Physically abused: Not on file    Forced sexual activity: Not on file  Other Topics Concern   Not on file  Social History Narrative   Exercise: trying to walk - limited by fatigue   Lives at home with husband and daughter   Right handed   Caffeine: occasional coke 1-2x per month    Family History  Problem Relation Age of Onset   Diabetes Mother    Fibromyalgia Mother    Ulcers Mother    Heart failure Mother    Thyroid disease Mother    Obesity Mother    Multiple myeloma Father    Hypertension Father    Stroke Father    Lupus Sister    Other Sister        abdominal adhesions resulting in bowel obstruction   Migraines Sister    Heart disease Brother        bypass surgery    Rheum arthritis Sister    Multiple myeloma Sister    Hypertension Sister    Heart attack Sister    Diabetes Sister    Hypertension Sister    Rheum arthritis Sister    Diabetes Sister    Diabetes Brother    Headache Other        siblings with headaches but not diagnosed as migraines    Past Medical History:  Diagnosis Date   Abnormal liver function test    Bronchiectasis        Dyspnea    Fibromuscular dysplasia ([place])    Fibromyalgia    Heart murmur    ILD (interstitial lung disease) ([place])    Joint pain    Menorrhagia  MIGRAINE HEADACHE    MVP (mitral valve prolapse)    Osteoarthritis    Polymyositis ([facility])    Dr [name]; chronic MTX   Pulmonary fibrosis ([place])    Rheumatoid arthritis ([place])    Sleep apnea    Vitamin B 12 deficiency    Vitamin D deficiency     Past Surgical History:  Procedure Laterality Date   ablation uterine  2010   CARDIAC CATHETERIZATION  2002   normal   DILATION AND CURETTAGE OF UTERUS  08-31-08   Dr [name]   IR ANGIO INTRA EXTRACRAN SEL COM CAROTID INNOMINATE BILAT MOD SED  09/18/2018   IR ANGIO VERTEBRAL SEL VERTEBRAL BILAT MOD SED  09/18/2018   IR US GUIDE VASC ACCESS RIGHT  09/18/2018    Current Outpatient Medications  Medication Sig Dispense Refill   albuterol (PROVENTIL HFA) 108 (90 Base) MCG/ACT inhaler Inhale 2 puffs into the lungs 4 (four) times daily as needed. Reported on 09/03/2015 1 Inhaler 4   albuterol (PROVENTIL) (2.5 MG/3ML) 0.083% nebulizer solution Take 3 mLs (2.5 mg total) by nebulization every 6 (six) hours as needed for wheezing or shortness of breath. 120 vial 5   aspirin EC 81 MG tablet Take 81 mg by mouth daily.     atenolol (TENORMIN) 25 MG tablet Take 0.5 tablets (12.5 mg total) by mouth daily as needed (mitral prolapse). 45 tablet 0   azaTHIOprine (IMURAN) 50 MG tablet TAKE 2 TABLETS(100 MG) BY MOUTH DAILY 60 tablet 2   BREO ELLIPTA 100-25 MCG/INH AEPB INHALE 1  PUFF INTO THE LUNGS DAILY 60 each 5   eletriptan (RELPAX) 20 MG tablet One tablet by mouth at onset of headache. May repeat in 2 hours if headache persists or recurs. 10 tablet 0   folic acid (FOLVITE) 1 MG tablet TAKE 2 TABLETS(2 MG) BY MOUTH DAILY 180 tablet 3   guaiFENesin (MUCINEX) 600 MG 12 hr tablet Take 2 tablets (1,200 mg total) by mouth 2 (two) times daily as needed for cough or to loosen phlegm. (Patient taking differently: Take 1,200 mg by mouth 2 (two) times daily. )     metFORMIN (GLUCOPHAGE) 500 MG tablet Take 1 tablet (500 mg total) by mouth 2 (two) times daily with a meal. 60 tablet 0   Multiple Vitamins-Minerals (MULTIVITAMIN GUMMIES ADULT PO) Take 2 each by mouth daily.     Respiratory Therapy Supplies (FLUTTER) DEVI Use as directed (Patient taking differently: 2 (two) times daily. Use as directed) 1 each 0   telmisartan (MICARDIS) 20 MG tablet TAKE 1 TABLET(20 MG) BY MOUTH DAILY 90 tablet 1   Fremanezumab-vfrm (AJOVY) 225 MG/1.5ML SOAJ Inject 225 mg into the skin every 30 (thirty) days. 1 pen 11   No current facility-administered medications for this visit.     Allergies as of 06/08/2019 - Review Complete 06/08/2019  Allergen Reaction Noted   Sulfonamide derivatives      Vitals: BP 117/69 (BP Location: Left Arm, Patient Position: Sitting)    Pulse 73    Temp 98.1 F (36.7 C) Comment: taken at front door   Ht _0  (1.727 m)    Wt 255 lb (115.7 kg)    BMI 38.77 kg/m  Last Weight:  Wt Readings from Last 1 Encounters:  06/08/19 255 lb (115.7 kg)   Last Height:   Ht Readings from Last 1 Encounters:  06/08/19 _1  (1.727 m)   Physical exam: Exam: Gen: NAD, conversant, well nourised, obese, well groomed  CV: RRR, no MRG. No Carotid Bruits. No peripheral edema, warm, nontender Eyes: Conjunctivae clear without exudates or hemorrhage  Neuro: Detailed Neurologic Exam  Speech:    Speech is normal; fluent and spontaneous with normal  comprehension.  Cognition:    The patient is oriented to person, place, and time;     recent and remote memory intact;     language fluent;     normal attention, concentration,     fund of knowledge Cranial Nerves:    The pupils are equal, round, and reactive to light.  Visual fields are full to finger confrontation. Extraocular movements are intact. Trigeminal sensation is intact and the muscles of mastication are normal. The face is symmetric. The palate elevates in the midline. Hearing intact. Voice is normal. Shoulder shrug is normal. The tongue has normal motion without fasciculations.   Motor Observation:    No asymmetry, no atrophy, and no involuntary movements noted. Tone:    Normal muscle tone.    Posture:    Posture is normal.    Strength: left arm proximal weakness otherwise strength is V/V in the upper and lower limbs.      Sensation: intact to LT      Assessment/Plan:   53 y.o. female here as requested by Dr. Quay Burow for headaches. PMHx RA, bronchiectasis, polymyositis on imuran follows with Rheumatology, never smoked, interstitial lung disease, fibromyalgia, migraines, mitral valve prolapse, abnormal liver function test. She has migraines and occipital neuralgia. Also left arm weakness and sensory changes and chronic neck pain.   - She completed sleep evaluation, AHI 5, decreased O2 at nigh, on O2 now at night - FMD: ASA and HTN control, repeat CTA head and neck due to new symptoms - Migraines and occipital neuralgia: Start Ajovy for migraines.  - MRI cervical spine for cervical radic.  She has had neck pain, she has numbness and tingling in the left hand and arm, ongoing for >  6 Months, failed conservative measures, under the care of a physician for years for this.  Needs MRI c-spine for cervical radic as well as cervico-occipital pain. - She has some brain fog, discussed normal aging, chronic pain and lifestyle factors. B12 575.  - Obesity: healthy weight and wellness  center referral, doing great as lost 25 pounds. - repeat CTAngio of the head and neck due to pain radiating in the neck, for surveillance of her FMD and to rule out any carotid involvement (feels a "cord" where the carotid artery shows, rule out carotid dissection has FMD in the carotids)  Orders Placed This Encounter  Procedures   CT ANGIO HEAD W OR WO CONTRAST   CT ANGIO NECK W OR WO CONTRAST   MR CERVICAL SPINE WO CONTRAST   Meds ordered this encounter  Medications   Fremanezumab-vfrm (AJOVY) 225 MG/1.5ML SOAJ    Sig: Inject 225 mg into the skin every 30 (thirty) days.    Dispense:  1 pen    Refill:  11     To prevent or relieve headaches, try the following: Cool Compress. Lie down and place a cool compress on your head.  Avoid headache triggers. If certain foods or odors seem to have triggered your migraines in the past, avoid them. A headache diary might help you identify triggers.  Include physical activity in your daily routine. Try a daily walk or other moderate aerobic exercise.  Manage stress. Find healthy ways to cope with the stressors, such as delegating tasks on your to-do list.  Practice relaxation techniques. Try deep breathing, yoga, massage and visualization.  Eat regularly. Eating regularly scheduled meals and maintaining a healthy diet might help prevent headaches. Also, drink plenty of fluids.  Follow a regular sleep schedule. Sleep deprivation might contribute to headaches Consider biofeedback. With this mind-body technique, you learn to control certain bodily functions -- such as muscle tension, heart rate and blood pressure -- to prevent headaches or reduce headache pain.    Proceed to emergency room if you experience new or worsening symptoms or symptoms do not resolve, if you have new neurologic symptoms or if headache is severe, or for any concerning symptom.   Provided education and documentation from American headache Society toolbox including articles  on: chronic migraine medication overuse headache, chronic migraines, prevention of migraines, behavioral and other nonpharmacologic treatments for headache.    Cc:   Binnie Rail, MD Lauraine Rinne NP  Sarina Ill, MD  [facility] [address] [place], [place] [address]  Phone [phone] Fax [phone]  A total of 45 minutes was spent face-to-face with this patient. Over half this time was spent on counseling patient on the  1. Chronic migraine without aura, with intractable migraine, so stated, with status migrainosus   2. Fibromuscular dysplasia (HCC)   3. Carotid dissection, bilateral (HCC)   4. Cervical radiculopathy   5. Left arm numbness   6. Left arm weakness    diagnosis and different diagnostic and therapeutic options, counseling and coordination of care, risks ans benefits of management, compliance, or risk factor reduction and education.

## 2019-06-08 NOTE — Patient Instructions (Addendum)
MRI of the cervical spine CTA of the head and neck Follow up 3-4 months  Occipital Neuralgia  Occipital neuralgia is a type of headache that causes brief episodes of very bad pain in the back of your head. Pain from occipital neuralgia may spread (radiate) to other parts of your head. These headaches may be caused by irritation of the nerves that leave your spinal cord high up in your neck, just below the base of your skull (occipital nerves). Your occipital nerves transmit sensations from the back of your head, the top of your head, and the areas behind your ears. What are the causes? This condition can occur without any known cause (primary headache syndrome). In other cases, this condition is caused by pressure on or irritation of one of the two occipital nerves. Pressure and irritation may be due to:  Muscle spasm in the neck.  Neck injury.  Wear and tear of the vertebrae in the neck (osteoarthritis).  Disease of the disks that separate the vertebrae.  Swollen blood vessels that put pressure on the occipital nerves.  Infections.  Tumors.  Diabetes. What are the signs or symptoms? This condition causes brief burning, stabbing, electric, shocking, or shooting pain which can radiate to the top of the head. It can happen on one side or both sides of the head. It can also cause:  Pain behind the eye.  Pain triggered by neck movement or hair brushing.  Scalp tenderness.  Aching in the back of the head between episodes of very bad pain.  Pain gets worse with exposure to bright lights. How is this diagnosed? There is no test that diagnoses this condition. Your health care provider may diagnose this condition based on a physical exam and your symptoms. Other tests may be done, such as:  Imaging studies of the brain and neck (cervical spine), such as an MRI or CT scan. These look for causes of pinched nerves.  Applying pressure to the nerves in the neck to try to re-create the  pain.  Injection of numbing medicine into the occipital nerve areas to see if pain goes away (diagnostic nerve block). How is this treated? Treatment for this condition may begin with simple measures, such as:  Rest.  Massage.  Applying heat or cold on the area.  Over-the-counter pain relievers. If these measures do not work, you may need other treatments, including:  Medicines, such as: ? Prescription-strength anti-inflammatory medicines. ? Muscle relaxants. ? Anti-seizure medicines, which can relieve pain. ? Antidepressants, which can relieve pain. ? Injected medicines, such as medicines that numb the area (local anesthetic) and steroids.  Pulsed radiofrequency ablation. This is when wires are implanted to deliver electrical impulses that block pain signals from the occipital nerve.  Surgery to relieve nerve pressure.  Physical therapy. Follow these instructions at home: Pain management      Avoid any activities that cause pain.  Rest when you have an attack of pain.  Try gentle massage to relieve pain.  Try a different pillow or sleeping position.  If directed, apply heat to the affected area as told by your health care provider. Use the heat source that your health care provider recommends, such as a moist heat pack or a heating pad. ? Place a towel between your skin and the heat source. ? Leave the heat on for 20-30 minutes. ? Remove the heat if your skin turns bright red. This is especially important if you are unable to feel pain, heat, or cold.  You may have a greater risk of getting burned.  If directed, apply ice to the back of the head and neck area as told by your health care provider. ? Put ice in a plastic bag. ? Place a towel between your skin and the bag. ? Leave the ice on for 20 minutes, 2-3 times per day. General instructions  Take over-the-counter and prescription medicines only as told by your health care provider.  Avoid things that make your  symptoms worse, such as bright lights.  Try to stay active. Get regular exercise that does not cause pain. Ask your health care provider to suggest safe exercises for you.  Work with a physical therapist to learn stretching exercises you can do at home.  Practice good posture.  Keep all follow-up visits as told by your health care provider. This is important. Contact a health care provider if:  Your medicine is not working.  You have new or worsening symptoms. Get help right away if:  You have very bad head pain that does not go away.  You have a sudden change in vision, balance, or speech. Summary  Occipital neuralgia is a type of headache that causes brief episodes of very bad pain in the back of your head.  Pain from occipital neuralgia may spread (radiate) to other parts of your head.  Treatment for this condition includes rest, massage, and medicines. This information is not intended to replace advice given to you by your health care provider. Make sure you discuss any questions you have with your health care provider. Document Released: 07/17/2001 Document Revised: 07/09/2017 Document Reviewed: 09/27/2016 Elsevier Patient Education  2020 [place] injection What is this medicine? FREMANEZUMAB (fre ma NEZ ue mab) is used to prevent migraine headaches. This medicine may be used for other purposes; ask your health care provider or pharmacist if you have questions. COMMON BRAND NAME(S): AJOVY What should I tell my health care provider before I take this medicine? They need to know if you have any of these conditions:  an unusual or allergic reaction to fremanezumab, other medicines, foods, dyes, or preservatives  pregnant or trying to get pregnant  breast-feeding How should I use this medicine? This medicine is for injection under the skin. You will be taught how to prepare and give this medicine. Use exactly as directed. Take your medicine at regular  intervals. Do not take your medicine more often than directed. It is important that you put your used needles and syringes in a special sharps container. Do not put them in a trash can. If you do not have a sharps container, call your pharmacist or healthcare provider to get one. Talk to your pediatrician regarding the use of this medicine in children. Special care may be needed. Overdosage: If you think you have taken too much of this medicine contact a poison control center or emergency room at once. NOTE: This medicine is only for you. Do not share this medicine with others. What if I miss a dose? If you miss a dose, take it as soon as you can. If it is almost time for your next dose, take only that dose. Do not take double or extra doses. What may interact with this medicine? Interactions are not expected. This list may not describe all possible interactions. Give your health care provider a list of all the medicines, herbs, non-prescription drugs, or dietary supplements you use. Also tell them if you smoke, drink alcohol, or use illegal drugs.  Some items may interact with your medicine. What should I watch for while using this medicine? Tell your doctor or healthcare professional if your symptoms do not start to get better or if they get worse. What side effects may I notice from receiving this medicine? Side effects that you should report to your doctor or health care professional as soon as possible:  allergic reactions like skin rash, itching or hives, swelling of the face, lips, or tongue Side effects that usually do not require medical attention (report these to your doctor or health care professional if they continue or are bothersome):  pain, redness, or irritation at site where injected This list may not describe all possible side effects. Call your doctor for medical advice about side effects. You may report side effects to FDA at 1-800-FDA-1088. Where should I keep my medicine?  Keep out of the reach of children. You will be instructed on how to store this medicine. Throw away any unused medicine after the expiration date on the label. NOTE: This sheet is a summary. It may not cover all possible information. If you have questions about this medicine, talk to your doctor, pharmacist, or health care provider.  2020 Elsevier/Gold Standard (2017-04-22 17:22:56)

## 2019-06-08 NOTE — Telephone Encounter (Signed)
Completed Ajovy 225 mg PA on CMM. KeyOQ:[id number]. Awaiting determination from [place].

## 2019-06-14 DIAGNOSIS — M79641 Pain in right hand: Secondary | ICD-10-CM

## 2019-06-15 NOTE — Telephone Encounter (Signed)
I cannot explain why she is having these symptoms.  If she develops swelling in her hand in the future she should make an appointment for [name] to evaluate.  She may also consider getting nerve conduction velocity and EMG by Dr. Jaynee Eagles.

## 2019-06-17 ENCOUNTER — Encounter (INDEPENDENT_AMBULATORY_CARE_PROVIDER_SITE_OTHER): Payer: Self-pay | Admitting: Family Medicine

## 2019-06-17 ENCOUNTER — Other Ambulatory Visit: Payer: Self-pay

## 2019-06-17 ENCOUNTER — Telehealth (INDEPENDENT_AMBULATORY_CARE_PROVIDER_SITE_OTHER): Payer: Federal, State, Local not specified - PPO | Admitting: Family Medicine

## 2019-06-17 DIAGNOSIS — E8881 Metabolic syndrome: Secondary | ICD-10-CM

## 2019-06-17 DIAGNOSIS — Z6838 Body mass index (BMI) 38.0-38.9, adult: Secondary | ICD-10-CM | POA: Diagnosis not present

## 2019-06-17 MED ORDER — METFORMIN HCL 500 MG PO TABS: 500.0000 mg | ORAL_TABLET | Freq: Two times a day (BID) | ORAL | 0 refills | Status: DC

## 2019-06-18 NOTE — Progress Notes (Signed)
Office: [phone]  /  Fax: [phone] TeleHealth Visit:  Jill Harper has verbally consented to this TeleHealth visit today. The patient is located at home, the provider is located at the [facility] office. The participants in this visit include the listed provider and patient. The visit was conducted today via [email or web address].  HPI:   Chief Complaint: OBESITY Jill Harper is here to discuss her progress with her obesity treatment plan. She is on the Category 2 plan and is following her eating plan approximately 98 % of the time. She states she is walking for 5-10 minutes 1 time per week. Jill Harper feels she has maintained her weight. She has had more health issues recently and has increased some stress eating. She has been able to get control relatively quickly. She states her weight was 254 lbs today. We were unable to weigh the patient today for this TeleHealth visit. She feels as if she has maintained her weight since her last visit. She has lost 1-4 lbs since starting treatment with [name].  Insulin Resistance Jill Harper has a diagnosis of insulin resistance based on her elevated fasting insulin level >5. Last A1c was controlled at 5.5. Although Jill Harper's blood glucose readings are still under good control, insulin resistance puts her at greater risk of metabolic syndrome and diabetes. She is tolerating metformin well, and is working on her diet and exercise to decrease risk of diabetes. She requests a refill today.  ASSESSMENT AND PLAN:  Insulin resistance - Plan: metFORMIN (GLUCOPHAGE) 500 MG tablet  Class 2 severe obesity with serious comorbidity and body mass index (BMI) of 38.0 to 38.9 in adult, unspecified obesity type ([place])  PLAN:  Insulin Resistance Jill Harper will continue to work on weight loss, exercise, and decreasing simple carbohydrates in her diet to help decrease the risk of diabetes. We dicussed metformin including benefits and risks. She was informed that eating too many simple  carbohydrates or too many calories at one sitting increases the likelihood of GI side effects. Jill Harper agrees to continue taking metformin 500 mg PO BID #60 and we will refill for 1 month. Jill Harper agrees to follow up with our clinic in 3 weeks as directed to monitor her progress.  Obesity Jill Harper is currently in the action stage of change. As such, her goal is to continue with weight loss efforts She has agreed to follow the Category 2 plan Jill Harper has been instructed to work up to a goal of 150 minutes of combined cardio and strengthening exercise per week for weight loss and overall health benefits. We discussed the following Behavioral Modification Strategies today: holiday eating strategies    Jill Harper has agreed to follow up with our clinic in 3 weeks. She was informed of the importance of frequent follow up visits to maximize her success with intensive lifestyle modifications for her multiple health conditions.  ALLERGIES: Allergies  Allergen Reactions  . Sulfonamide Derivatives     REACTION: hives    MEDICATIONS: Current Outpatient Medications on File Prior to Visit  Medication Sig Dispense Refill  . albuterol (PROVENTIL HFA) 108 (90 Base) MCG/ACT inhaler Inhale 2 puffs into the lungs 4 (four) times daily as needed. Reported on 09/03/2015 1 Inhaler 4  . albuterol (PROVENTIL) (2.5 MG/3ML) 0.083% nebulizer solution Take 3 mLs (2.5 mg total) by nebulization every 6 (six) hours as needed for wheezing or shortness of breath. 120 vial 5  . aspirin EC 81 MG tablet Take 81 mg by mouth daily.    [name] atenolol (TENORMIN)  25 MG tablet Take 0.5 tablets (12.5 mg total) by mouth daily as needed (mitral prolapse). 45 tablet 0  . azaTHIOprine (IMURAN) 50 MG tablet TAKE 2 TABLETS(100 MG) BY MOUTH DAILY 60 tablet 2  . BREO ELLIPTA 100-25 MCG/INH AEPB INHALE 1 PUFF INTO THE LUNGS DAILY 60 each 5  . eletriptan (RELPAX) 20 MG tablet One tablet by mouth at onset of headache. May repeat in 2 hours if headache persists or  recurs. 10 tablet 0  . folic acid (FOLVITE) 1 MG tablet TAKE 2 TABLETS(2 MG) BY MOUTH DAILY 180 tablet 3  . Fremanezumab-vfrm (AJOVY) 225 MG/1.5ML SOAJ Inject 225 mg into the skin every 30 (thirty) days. 1 pen 11  . guaiFENesin (MUCINEX) 600 MG 12 hr tablet Take 2 tablets (1,200 mg total) by mouth 2 (two) times daily as needed for cough or to loosen phlegm. (Patient taking differently: Take 1,200 mg by mouth 2 (two) times daily. )    . Multiple Vitamins-Minerals (MULTIVITAMIN GUMMIES ADULT PO) Take 2 each by mouth daily.    [name] Respiratory Therapy Supplies (FLUTTER) DEVI Use as directed (Patient taking differently: 2 (two) times daily. Use as directed) 1 each 0  . telmisartan (MICARDIS) 20 MG tablet TAKE 1 TABLET(20 MG) BY MOUTH DAILY 90 tablet 1   No current facility-administered medications on file prior to visit.     PAST MEDICAL HISTORY: Past Medical History:  Diagnosis Date  . Abnormal liver function test   . Bronchiectasis       . Dyspnea   . Fibromuscular dysplasia ([place])   . Fibromyalgia   . Heart murmur   . ILD (interstitial lung disease) ([place])   . Joint pain   . Menorrhagia   . MIGRAINE HEADACHE   . MVP (mitral valve prolapse)   . Osteoarthritis   . Polymyositis ([place])    Dr [name]; chronic MTX  . Pulmonary fibrosis ([place])   . Rheumatoid arthritis ([place])   . Sleep apnea   . Vitamin B 12 deficiency   . Vitamin D deficiency     PAST SURGICAL HISTORY: Past Surgical History:  Procedure Laterality Date  . ablation uterine  2010  . CARDIAC CATHETERIZATION  2002   normal  . DILATION AND CURETTAGE OF UTERUS  08-31-08   Dr [name]  . IR ANGIO INTRA EXTRACRAN SEL COM CAROTID INNOMINATE BILAT MOD SED  09/18/2018  . IR ANGIO VERTEBRAL SEL VERTEBRAL BILAT MOD SED  09/18/2018  . IR US GUIDE VASC ACCESS RIGHT  09/18/2018    SOCIAL HISTORY: Social History   Tobacco Use  . Smoking status: Never Smoker  . Smokeless tobacco: Never Used  . Tobacco comment: Married,  lives with spouse. 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 Alcohol use: Never    Frequency: Never    Comment: maybe a wine cooler once every other year  . Drug use: Never    FAMILY HISTORY: Family History  Problem Relation Age of Onset  . Diabetes Mother   . Fibromyalgia Mother   . Ulcers Mother   . Heart failure Mother   . Thyroid disease Mother   . Obesity Mother   . Multiple myeloma Father   . Hypertension Father   . Stroke Father   . Lupus Sister   . Other Sister        abdominal adhesions resulting in bowel obstruction  . Migraines Sister   . Heart disease Brother  bypass surgery  . Rheum arthritis Sister   . Multiple myeloma Sister   . Hypertension Sister   . Heart attack Sister   . Diabetes Sister   . Hypertension Sister   . Rheum arthritis Sister   . Diabetes Sister   . Diabetes Brother   . Headache Other        siblings with headaches but not diagnosed as migraines    ROS: Review of Systems  Constitutional: Negative for weight loss.    PHYSICAL EXAM: Pt in no acute distress  RECENT LABS AND TESTS: BMET    Component Value Date/Time   NA 140 04/06/2019 1551   NA 144 10/09/2018 1300   K 4.5 04/06/2019 1551   CL 105 04/06/2019 1551   CO2 26 04/06/2019 1551   GLUCOSE 82 04/06/2019 1551   BUN 11 04/06/2019 1551   BUN 7 10/09/2018 1300   CREATININE 0.69 04/06/2019 1551   CALCIUM 10.3 04/06/2019 1551   GFRNONAA 99 04/06/2019 1551   GFRAA 115 04/06/2019 1551   Lab Results  Component Value Date   HGBA1C 5.5 10/09/2018   HGBA1C 4.8 04/24/2016   Lab Results  Component Value Date   INSULIN 22.4 10/09/2018   CBC    Component Value Date/Time   WBC 6.7 04/06/2019 1551   RBC 5.06 04/06/2019 1551   HGB 14.2 04/06/2019 1551   HCT 43.0 04/06/2019 1551   PLT 309 04/06/2019 1551   MCV 85.0 04/06/2019 1551   MCH 28.1 04/06/2019 1551   MCHC 33.0 04/06/2019 1551   RDW 13.2 04/06/2019 1551    LYMPHSABS 1,353 04/06/2019 1551   MONOABS 666 01/31/2017 1006   EOSABS 107 04/06/2019 1551   BASOSABS 27 04/06/2019 1551   Iron/TIBC/Ferritin/ %Sat No results found for: IRON, TIBC, FERRITIN, IRONPCTSAT Lipid Panel     Component Value Date/Time   CHOL 141 10/09/2018 1300   TRIG 116 10/09/2018 1300   HDL 44 10/09/2018 1300   CHOLHDL 3 04/24/2016 0900   VLDL 11.8 04/24/2016 0900   LDLCALC 74 10/09/2018 1300   Hepatic Function Panel     Component Value Date/Time   PROT 8.0 04/06/2019 1551   PROT 7.8 10/09/2018 1300   ALBUMIN 4.6 10/09/2018 1300   AST 19 04/06/2019 1551   ALT 16 04/06/2019 1551   ALKPHOS 100 10/09/2018 1300   BILITOT 0.6 04/06/2019 1551   BILITOT 0.6 10/09/2018 1300   BILIDIR 0.1 04/03/2018 1457   IBILI 0.3 04/03/2018 1457      Component Value Date/Time   TSH 3.24 10/01/2018 1452   TSH 5.28 (H) 03/18/2018 1642   TSH 4.82 (H) 02/18/2018 1609      I, Trixie Dredge, am acting as transcriptionist for Dennard Nip, MD I have reviewed the above documentation for accuracy and completeness, and I agree with the above. -Dennard Nip, MD

## 2019-06-29 DIAGNOSIS — J471 Bronchiectasis with (acute) exacerbation: Secondary | ICD-10-CM | POA: Diagnosis not present

## 2019-06-29 DIAGNOSIS — G4733 Obstructive sleep apnea (adult) (pediatric): Secondary | ICD-10-CM | POA: Diagnosis not present

## 2019-07-01 NOTE — Telephone Encounter (Signed)
Still awaiting outcome to be faxed to our office. I called [facility] and spoke with Ramiya. Pt picked up the medication on 06/13/2019. It went through insurance for $4.

## 2019-07-07 ENCOUNTER — Ambulatory Visit
Admission: RE | Admit: 2019-07-07 | Discharge: 2019-07-07 | Disposition: A | Discharge status: Home / Self Care | Payer: Federal, State, Local not specified - PPO | Source: Ambulatory Visit | Attending: Neurology | Admitting: Neurology

## 2019-07-07 DIAGNOSIS — M5412 Radiculopathy, cervical region: Secondary | ICD-10-CM | POA: Diagnosis not present

## 2019-07-07 DIAGNOSIS — R29898 Other symptoms and signs involving the musculoskeletal system: Secondary | ICD-10-CM

## 2019-07-07 DIAGNOSIS — I7771 Dissection of carotid artery: Secondary | ICD-10-CM

## 2019-07-07 DIAGNOSIS — M79641 Pain in right hand: Secondary | ICD-10-CM | POA: Diagnosis not present

## 2019-07-07 DIAGNOSIS — R519 Headache, unspecified: Secondary | ICD-10-CM | POA: Diagnosis not present

## 2019-07-07 DIAGNOSIS — M542 Cervicalgia: Secondary | ICD-10-CM | POA: Diagnosis not present

## 2019-07-07 DIAGNOSIS — R200 Anesthesia of skin: Secondary | ICD-10-CM

## 2019-07-07 DIAGNOSIS — J471 Bronchiectasis with (acute) exacerbation: Secondary | ICD-10-CM | POA: Diagnosis not present

## 2019-07-07 DIAGNOSIS — I773 Arterial fibromuscular dysplasia: Secondary | ICD-10-CM

## 2019-07-07 DIAGNOSIS — G5622 Lesion of ulnar nerve, left upper limb: Secondary | ICD-10-CM | POA: Diagnosis not present

## 2019-07-07 DIAGNOSIS — G5601 Carpal tunnel syndrome, right upper limb: Secondary | ICD-10-CM | POA: Diagnosis not present

## 2019-07-07 DIAGNOSIS — G4733 Obstructive sleep apnea (adult) (pediatric): Secondary | ICD-10-CM | POA: Diagnosis not present

## 2019-07-07 MED ORDER — IOPAMIDOL (ISOVUE-370) INJECTION 76%
75.0000 mL | Freq: Once | INTRAVENOUS | Status: AC | PRN
  Administered 2019-07-07: 75 mL via INTRAVENOUS

## 2019-07-08 ENCOUNTER — Telehealth (INDEPENDENT_AMBULATORY_CARE_PROVIDER_SITE_OTHER): Payer: Federal, State, Local not specified - PPO | Admitting: Family Medicine

## 2019-07-08 ENCOUNTER — Other Ambulatory Visit: Payer: Self-pay

## 2019-07-08 ENCOUNTER — Encounter (INDEPENDENT_AMBULATORY_CARE_PROVIDER_SITE_OTHER): Payer: Self-pay | Admitting: Family Medicine

## 2019-07-08 DIAGNOSIS — Z6838 Body mass index (BMI) 38.0-38.9, adult: Secondary | ICD-10-CM | POA: Diagnosis not present

## 2019-07-08 DIAGNOSIS — E8881 Metabolic syndrome: Secondary | ICD-10-CM | POA: Diagnosis not present

## 2019-07-08 MED ORDER — METFORMIN HCL 500 MG PO TABS: 500.0000 mg | ORAL_TABLET | Freq: Two times a day (BID) | ORAL | 0 refills | Status: DC

## 2019-07-08 NOTE — Progress Notes (Signed)
Office: [phone]  /  Fax: [phone] TeleHealth Visit:  Jill Harper has verbally consented to this TeleHealth visit today. The patient is located at work, the provider is located at the [employer] [facility] office. The participants in this visit include the listed provider and patient. The visit was conducted today via telephone call (Doxy failed - changed to telephone call).  HPI:   Chief Complaint: OBESITY Asyria is here to discuss her progress with her obesity treatment plan. She is on the Category 2 plan and is following her eating plan approximately 98% of the time. She states she is exercising 0 minutes 0 times per week. Saraih continues to do well with weight loss and has lost another 2 lbs even over Thanksgiving. She feels she is doing better with being mindful and meal prepping.  We were unable to weigh the patient today for this TeleHealth visit. She feels as if she has lost 2 lbs since her last visit. She has lost 1 lb since starting treatment with [name].  Insulin Resistance Dedee has a diagnosis of insulin resistance based on her elevated fasting insulin level >5. Although Nakshatra's blood glucose readings are still under good control, insulin resistance puts her at greater risk of metabolic syndrome and diabetes. She is tolerating metformin and is doing well on her eating plan minimizing simple carbs. She has questions about supplements to help insulin resistance.  ASSESSMENT AND PLAN:  Insulin resistance - Plan: metFORMIN (GLUCOPHAGE) 500 MG tablet  Class 2 severe obesity with serious comorbidity and body mass index (BMI) of 38.0 to 38.9 in adult, unspecified obesity type ([place])  PLAN:  Insulin Resistance Chriselda will continue to work on weight loss, exercise, and decreasing simple carbohydrates in her diet to help decrease the risk of diabetes. We dicussed metformin including benefits and risks. She was informed that eating too many simple carbohydrates or too many calories at  one sitting increases the likelihood of GI side effects. Chenise was given a refill on her metformin 500 mg BID #60 with 0 refills and agrees to follow-up with our clinic in 3 weeks. She was advised that metformin has been shown to be safer and more effective than supplements to improve insulin resistance.  I spent > than 50% of the 20 minute visit on counseling as documented in the note.  TIME SPENT: 14 minutes  Obesity Daphne is currently in the action stage of change. As such, her goal is to continue with weight loss efforts She has agreed to follow the Category 2 plan Paisly has been instructed to work up to a goal of 150 minutes of combined cardio and strengthening exercise per week for weight loss and overall health benefits. We discussed the following Behavioral Modification Stratagies today: work on meal planning and easy cooking plans, and holiday eating strategies.  Cyndee has agreed to follow-up with our clinic in 3 weeks. She was informed of the importance of frequent follow-up visits to maximize her success with intensive lifestyle modifications for her multiple health conditions.  ALLERGIES: Allergies  Allergen Reactions  . Sulfonamide Derivatives     REACTION: hives    MEDICATIONS: Current Outpatient Medications on File Prior to Visit  Medication Sig Dispense Refill  . albuterol (PROVENTIL HFA) 108 (90 Base) MCG/ACT inhaler Inhale 2 puffs into the lungs 4 (four) times daily as needed. Reported on 09/03/2015 1 Inhaler 4  . albuterol (PROVENTIL) (2.5 MG/3ML) 0.083% nebulizer solution Take 3 mLs (2.5 mg total) by nebulization every 6 (  six) hours as needed for wheezing or shortness of breath. 120 vial 5  . aspirin EC 81 MG tablet Take 81 mg by mouth daily.    [name] atenolol (TENORMIN) 25 MG tablet Take 0.5 tablets (12.5 mg total) by mouth daily as needed (mitral prolapse). 45 tablet 0  . azaTHIOprine (IMURAN) 50 MG tablet TAKE 2 TABLETS(100 MG) BY MOUTH DAILY 60 tablet 2  . BREO ELLIPTA  100-25 MCG/INH AEPB INHALE 1 PUFF INTO THE LUNGS DAILY 60 each 5  . eletriptan (RELPAX) 20 MG tablet One tablet by mouth at onset of headache. May repeat in 2 hours if headache persists or recurs. 10 tablet 0  . folic acid (FOLVITE) 1 MG tablet TAKE 2 TABLETS(2 MG) BY MOUTH DAILY 180 tablet 3  . Fremanezumab-vfrm (AJOVY) 225 MG/1.5ML SOAJ Inject 225 mg into the skin every 30 (thirty) days. 1 pen 11  . guaiFENesin (MUCINEX) 600 MG 12 hr tablet Take 2 tablets (1,200 mg total) by mouth 2 (two) times daily as needed for cough or to loosen phlegm. (Patient taking differently: Take 1,200 mg by mouth 2 (two) times daily. )    . metFORMIN (GLUCOPHAGE) 500 MG tablet Take 1 tablet (500 mg total) by mouth 2 (two) times daily with a meal. 60 tablet 0  . Multiple Vitamins-Minerals (MULTIVITAMIN GUMMIES ADULT PO) Take 2 each by mouth daily.    [name] Respiratory Therapy Supplies (FLUTTER) DEVI Use as directed (Patient taking differently: 2 (two) times daily. Use as directed) 1 each 0  . telmisartan (MICARDIS) 20 MG tablet TAKE 1 TABLET(20 MG) BY MOUTH DAILY 90 tablet 1   No current facility-administered medications on file prior to visit.     PAST MEDICAL HISTORY: Past Medical History:  Diagnosis Date  . Abnormal liver function test   . Bronchiectasis       . Dyspnea   . Fibromuscular dysplasia ([place])   . Fibromyalgia   . Heart murmur   . ILD (interstitial lung disease) ([place])   . Joint pain   . Menorrhagia   . MIGRAINE HEADACHE   . MVP (mitral valve prolapse)   . Osteoarthritis   . Polymyositis ([place])    Dr [name]; chronic MTX  . Pulmonary fibrosis ([place])   . Rheumatoid arthritis ([place])   . Sleep apnea   . Vitamin B 12 deficiency   . Vitamin D deficiency     PAST SURGICAL HISTORY: Past Surgical History:  Procedure Laterality Date  . ablation uterine  2010  . CARDIAC CATHETERIZATION  2002   normal  . DILATION AND CURETTAGE OF UTERUS  08-31-08   Dr [name]  . IR ANGIO INTRA  EXTRACRAN SEL COM CAROTID INNOMINATE BILAT MOD SED  09/18/2018  . IR ANGIO VERTEBRAL SEL VERTEBRAL BILAT MOD SED  09/18/2018  . IR US GUIDE VASC ACCESS RIGHT  09/18/2018    SOCIAL HISTORY: Social History   Tobacco Use  . Smoking status: Never Smoker  . Smokeless tobacco: Never Used  . Tobacco comment: Married, lives with spouse. 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 Alcohol use: Never    Frequency: Never    Comment: maybe a wine cooler once every other year  . Drug use: Never    FAMILY HISTORY: Family History  Problem Relation Age of Onset  . Diabetes Mother   . Fibromyalgia Mother   . Ulcers Mother   . Heart failure Mother   .  Thyroid disease Mother   . Obesity Mother   . Multiple myeloma Father   . Hypertension Father   . Stroke Father   . Lupus Sister   . Other Sister        abdominal adhesions resulting in bowel obstruction  . Migraines Sister   . Heart disease Brother        bypass surgery  . Rheum arthritis Sister   . Multiple myeloma Sister   . Hypertension Sister   . Heart attack Sister   . Diabetes Sister   . Hypertension Sister   . Rheum arthritis Sister   . Diabetes Sister   . Diabetes Brother   . Headache Other        siblings with headaches but not diagnosed as migraines   ROS: ROS none noted.  PHYSICAL EXAM: Pt in no acute distress  RECENT LABS AND TESTS: BMET    Component Value Date/Time   NA 140 04/06/2019 1551   NA 144 10/09/2018 1300   K 4.5 04/06/2019 1551   CL 105 04/06/2019 1551   CO2 26 04/06/2019 1551   GLUCOSE 82 04/06/2019 1551   BUN 11 04/06/2019 1551   BUN 7 10/09/2018 1300   CREATININE 0.69 04/06/2019 1551   CALCIUM 10.3 04/06/2019 1551   GFRNONAA 99 04/06/2019 1551   GFRAA 115 04/06/2019 1551   Lab Results  Component Value Date   HGBA1C 5.5 10/09/2018   HGBA1C 4.8 04/24/2016   Lab Results  Component Value Date   INSULIN 22.4 10/09/2018   CBC    Component  Value Date/Time   WBC 6.7 04/06/2019 1551   RBC 5.06 04/06/2019 1551   HGB 14.2 04/06/2019 1551   HCT 43.0 04/06/2019 1551   PLT 309 04/06/2019 1551   MCV 85.0 04/06/2019 1551   MCH 28.1 04/06/2019 1551   MCHC 33.0 04/06/2019 1551   RDW 13.2 04/06/2019 1551   LYMPHSABS 1,353 04/06/2019 1551   MONOABS 666 01/31/2017 1006   EOSABS 107 04/06/2019 1551   BASOSABS 27 04/06/2019 1551   Iron/TIBC/Ferritin/ %Sat No results found for: IRON, TIBC, FERRITIN, IRONPCTSAT Lipid Panel     Component Value Date/Time   CHOL 141 10/09/2018 1300   TRIG 116 10/09/2018 1300   HDL 44 10/09/2018 1300   CHOLHDL 3 04/24/2016 0900   VLDL 11.8 04/24/2016 0900   LDLCALC 74 10/09/2018 1300   Hepatic Function Panel     Component Value Date/Time   PROT 8.0 04/06/2019 1551   PROT 7.8 10/09/2018 1300   ALBUMIN 4.6 10/09/2018 1300   AST 19 04/06/2019 1551   ALT 16 04/06/2019 1551   ALKPHOS 100 10/09/2018 1300   BILITOT 0.6 04/06/2019 1551   BILITOT 0.6 10/09/2018 1300   BILIDIR 0.1 04/03/2018 1457   IBILI 0.3 04/03/2018 1457      Component Value Date/Time   TSH 3.24 10/01/2018 1452   TSH 5.28 (H) 03/18/2018 1642   TSH 4.82 (H) 02/18/2018 1609   Results for ELANDRA, POWELL (MRN [id number]) as of 07/08/2019 11:01  Ref. Range 01/01/2019 15:35  Vitamin D, 25-Hydroxy Latest Ref Range: 30 - 100 ng/mL 45   I, Michaelene Song, am acting as Location manager for Dennard Nip, MD I have reviewed the above documentation for accuracy and completeness, and I agree with the above. -Dennard Nip, MD

## 2019-07-09 ENCOUNTER — Telehealth: Payer: Self-pay

## 2019-07-09 MED ORDER — AZATHIOPRINE 50 MG PO TABS: ORAL_TABLET | ORAL | 0 refills | Status: DC

## 2019-07-09 NOTE — Telephone Encounter (Signed)
Refill request received via fax from [facility] for Imuran.   Last Visit: 05/05/2019 Next Visit: 08/20/2019 Labs: 04/06/2019 CBC and CMP WNL  attempted to contact patient and left message on machine to advise patient she is due to update labs (at a [facility] or [facility]). Advised patient to contact the office with location so orders can be released.   Okay to refill 30 day supply, per Dr. Estanislado Pandy.

## 2019-07-16 ENCOUNTER — Telehealth: Payer: Self-pay | Admitting: Rheumatology

## 2019-07-16 DIAGNOSIS — Z79899 Other long term (current) drug therapy: Secondary | ICD-10-CM

## 2019-07-16 NOTE — Telephone Encounter (Signed)
Patient going to [facility] for labs on [facility]. Please release orders.

## 2019-07-16 NOTE — Telephone Encounter (Signed)
We received another PA request. I called [facility] and spoke with Roderic Palau (?). He confirmed the Ajovy went through for $4.99 and is ready for pickup. Will disregard duplicate PA request.

## 2019-07-16 NOTE — Telephone Encounter (Signed)
Lab orders released for [facility].  

## 2019-07-17 ENCOUNTER — Other Ambulatory Visit: Payer: Self-pay

## 2019-07-17 ENCOUNTER — Ambulatory Visit: Payer: Federal, State, Local not specified - PPO | Admitting: Pulmonary Disease

## 2019-07-17 ENCOUNTER — Encounter: Payer: Self-pay | Admitting: Pulmonary Disease

## 2019-07-17 VITALS — BP 120/78 | HR 78 | Temp 97.2°F | Ht 68.0 in | Wt 257.8 lb

## 2019-07-17 DIAGNOSIS — G4734 Idiopathic sleep related nonobstructive alveolar hypoventilation: Secondary | ICD-10-CM | POA: Diagnosis not present

## 2019-07-17 DIAGNOSIS — J849 Interstitial pulmonary disease, unspecified: Secondary | ICD-10-CM | POA: Diagnosis not present

## 2019-07-17 DIAGNOSIS — J479 Bronchiectasis, uncomplicated: Secondary | ICD-10-CM

## 2019-07-17 DIAGNOSIS — G4733 Obstructive sleep apnea (adult) (pediatric): Secondary | ICD-10-CM | POA: Diagnosis not present

## 2019-07-17 DIAGNOSIS — R0989 Other specified symptoms and signs involving the circulatory and respiratory systems: Secondary | ICD-10-CM

## 2019-07-17 NOTE — Progress Notes (Signed)
[facility]  No chief complaint on file.   Constitutional:  BP 120/78 (BP Location: Left Arm, Cuff Size: Large)   Pulse 78   Temp (!) 97.2 F (36.2 C) (Oral)   Ht 5' 8"  (1.727 m)   Wt 257 lb 12.8 oz (116.9 kg)   SpO2 98%   BMI 39.20 kg/m   Past Medical History:  Fibromyalgia, Migraine HA, MVP, OA, Fibromuscular dysplasia, Erythema nodosum, Vit D deficiency  Brief Summary:  Jill Harper is a 53 y.o. female with bronchiectasis in the setting of seropositive rheumatoid arthritis and polymyositis.  She has been using CPAP and oxygen at night.  No issues with set up.  She has been very frustrated with [employer].  They are not clear at all with their billing process.  She is concerned that she will get a surprise bill.  She wants to switch to different home care company.  She has been getting some irritation in her throat.  Has to clear her throat.  Happens intermittently.  Not having sinus pressure or fever.    Had episode of band like discomfort around her lower chest few days ago.  Took aleve and this resolved.  Not having cough, wheeze, sputum, fever, or hemoptysis.   Physical Exam:   Appearance - well kempt   ENMT - no sinus tenderness, no nasal discharge, no oral exudate  Neck - no masses, trachea midline, no thyromegaly, no elevation in JVP  Respiratory - normal appearance of chest wall, normal respiratory effort w/o accessory muscle use, no dullness on percussion, no wheezing or rales  CV - s1s2 regular rate and rhythm, no murmurs, no peripheral edema, radial pulses symmetric  GI - soft, non tender  Lymph - no adenopathy noted in neck and axillary areas  MSK - normal gait  Ext - no cyanosis, clubbing, or joint inflammation noted  Skin - no rashes, lesions, or ulcers  Neuro - normal strength, oriented x 3  Psych - normal mood and affect    Assessment/Plan:   ILD and Bronchiectasis in setting of RA and polymyositis. -  imuran managed by Dr. Estanislado Pandy with rheumatology - continue breo, prn albuterol - prn mucinex, flutter valve  Obstructive sleep apnea. - she is compliant with CPAP and reports benefit - continue auto CPAP - she will call with information about DME she would like to switch to >> explained potential issues that could arise with trying to switch DMEs  Nocturnal hypoxemia. - 2 liters oxygen at night with CPAP  Tickle in throat. - likely from post nasal drip - she can try nasal irrigation prn  Dyspnea on exertion. - she can't walk more than 200 feet w/o having to stop and rest - completed handicap parking form in October 2019  Advise for COVID 19. - discussed vaccine pros/cons, and know side effect profile   Patient Instructions  Can try using saline nasal spray to help when you get sinus congestion, post nasal drip, or tickle in your throat  Follow up in 6 months    A total of  27 minutes were spent face to face with the patient and more than half of that time involved counseling or coordination of care.   Chesley Mires, MD [facility] Pager: [phone] 07/17/2019, 4:56 PM  Flow Sheet    Pulmonary tests:  PFT 10/30/11 >> FEV1 3.31 (114%), FEV1% 85, TLC 6.06 (108%), DLCO 77%, no BD PFT 11/18/12 >> FEV1 3.18 (111%), FEV1% 84,  TLC 5.34 (95%), DLCO 67%, no BD PFT 12/02/15 >> FEV1 2.96 (108%), FEV1% 92, TLC 4.95 (86%), DLCO 65%, no BD Serology 01/31/17 >> ANA negative, RF 29, anti CCP 213, SCL 70 negative, SSA/SSB negative, ENA SM negative, Jo-1 positive Quantiferon gold 01/31/17 >> negative PFT 04/17/18 >> FEV1 2.46 (101%), FEV1% 90, DLCO 76% PFT 02/05/19 >> FEV1 2.30 (87%), FEV1% 89, TLC 4.21 (74%), DLCO 81%, no BD  Chest imaging:  CT chest 03/23/05 >> patchy b/l lower lung ASD with cylindrical BTX CT chest 02/17/08 >> peripheral and basilar predominant subpleural GGO CT chest 12/01/08 >> no change HRCT chest 09/07/15 >> scattered GGO HRCT chest 04/15/18 >>  patchy confluent subpleural and peripheral peribronchovascular reticulation and ground-glass opacity throughout both lungs with a strong basilar gradient, traction BTX  Labs:  Serology 01/31/17 >> RF 29, CCP 213, Jo-1 > 8; ANA, SCL 70, SSA/SSB, ENA Sm negative Quantiferon gold 01/31/17 >> negative Ig 01/31/17 >> IgG 1992, IgA 147, IgM 136  Sleep testing:  HST 07/28/18 >> AHI 5, SpO2 low 83% ONO with CPAP 11/05/18 >> test time 6 hrs 24 min, baseline SpO2 90%, low SpO2 80%; spent 1 hr 15 min with SpO2 < 88% Auto CPAP 02/15/19 to 03/16/19 >> used on 30 of 30 nights with average 7 hrs 41 min.  Average AHI 1.2 with median CPAP 6 and 95 th percentile CPAP 8 cm H2O  Cardiac tests:  Echo 05/23/18 >> EF 60 to 65%, grade 1 DD, mild MR   Medications:   Allergies as of 07/17/2019      Reactions   Sulfonamide Derivatives    REACTION: hives      Medication List       Accurate as of July 17, 2019  4:56 PM. If you have any questions, ask your nurse or doctor.        Ajovy 225 MG/1.5ML Soaj Generic drug: Fremanezumab-vfrm Inject 225 mg into the skin every 30 (thirty) days.   albuterol 108 (90 Base) MCG/ACT inhaler Commonly known as: Proventil HFA Inhale 2 puffs into the lungs 4 (four) times daily as needed. Reported on 09/03/2015   albuterol (2.5 MG/3ML) 0.083% nebulizer solution Commonly known as: PROVENTIL Take 3 mLs (2.5 mg total) by nebulization every 6 (six) hours as needed for wheezing or shortness of breath.   aspirin EC 81 MG tablet Take 81 mg by mouth daily.   atenolol 25 MG tablet Commonly known as: TENORMIN Take 0.5 tablets (12.5 mg total) by mouth daily as needed (mitral prolapse).   azaTHIOprine 50 MG tablet Commonly known as: IMURAN TAKE 2 TABLETS(100 MG) BY MOUTH DAILY   Breo Ellipta 100-25 MCG/INH Aepb Generic drug: fluticasone furoate-vilanterol INHALE 1 PUFF INTO THE LUNGS DAILY   eletriptan 20 MG tablet Commonly known as: Relpax One tablet by mouth at onset  of headache. May repeat in 2 hours if headache persists or recurs.   Flutter Devi Use as directed What changed: when to take this   folic acid 1 MG tablet Commonly known as: FOLVITE TAKE 2 TABLETS(2 MG) BY MOUTH DAILY   guaiFENesin 600 MG 12 hr tablet Commonly known as: Mucinex Take 2 tablets (1,200 mg total) by mouth 2 (two) times daily as needed for cough or to loosen phlegm. What changed: when to take this   metFORMIN 500 MG tablet Commonly known as: GLUCOPHAGE Take 1 tablet (500 mg total) by mouth 2 (two) times daily with a meal.   MULTIVITAMIN GUMMIES ADULT PO Take 2 each  by mouth daily.   telmisartan 20 MG tablet Commonly known as: MICARDIS TAKE 1 TABLET(20 MG) BY MOUTH DAILY       Past Surgical History:  She  has a past surgical history that includes Cardiac catheterization (2002); Dilation and curettage of uterus (08-31-08); ablation uterine (2010); IR ANGIO VERTEBRAL SEL VERTEBRAL BILAT MOD SED (09/18/2018); IR ANGIO INTRA EXTRACRAN SEL COM CAROTID INNOMINATE BILAT MOD SED (09/18/2018); and IR US Guide Vasc Access Right (09/18/2018).  Family History:  Her family history includes Diabetes in her brother, mother, sister, and sister; Fibromyalgia in her mother; Headache in an other family member; Heart attack in her sister; Heart disease in her brother; Heart failure in her mother; Hypertension in her father, sister, and sister; Lupus in her sister; Migraines in her sister; Multiple myeloma in her father and sister; Obesity in her mother; Other in her sister; Rheum arthritis in her sister and sister; Stroke in her father; Thyroid disease in her mother; Ulcers in her mother.  Social History:  She  reports that she has never smoked. She has never used smokeless tobacco. She reports that she does not drink alcohol or use drugs.

## 2019-07-17 NOTE — Patient Instructions (Signed)
Can try using saline nasal spray to help when you get sinus congestion, post nasal drip, or tickle in your throat  Follow up in 6 months

## 2019-07-20 ENCOUNTER — Other Ambulatory Visit: Payer: Self-pay

## 2019-07-20 ENCOUNTER — Ambulatory Visit: Payer: Federal, State, Local not specified - PPO | Admitting: Neurology

## 2019-07-20 ENCOUNTER — Encounter: Payer: Self-pay | Admitting: Neurology

## 2019-07-20 VITALS — BP 143/78 | HR 60 | Temp 96.8°F | Ht 68.0 in | Wt 255.0 lb

## 2019-07-20 DIAGNOSIS — M79641 Pain in right hand: Secondary | ICD-10-CM | POA: Diagnosis not present

## 2019-07-20 NOTE — Progress Notes (Signed)
[facility]    Provider:  Dr Jaynee Eagles Referring Provider: Estanislado Pandy, MD Primary Care Physician:  Binnie Rail, MD Lauraine Rinne NP  CC:  Headache,FMD   Interval history 07/20/2019: Patient here for a new chief complaint as requested by Dr. Estanislado Pandy for hand pain. PMHx vitamin B12 deficiency, sleep apnea, rheumatoid arthritis, pulmonary fibrosis, polymyositis, osteoarthritis, migraines, joint pain, interstitial lung disease, fibromyalgia, fibromuscular dysplasia, bronchiectasis and abnormal liver function test.  In August or September, she had hand pain. Her hands felt cool, right > left, she started getting cramping in the right hand, not significantly painful just cramped, difficult time straightening it, massage helped. A month later it happened again, more painful, and she could see it "drawing", started getting colder to the touch, she woke up with excruciating pain in the hand and forearm ventrally and she was cramped again. She took 2 alleve. Severe, some numbness, pain felt like tightness, severe and visibly "drawing" up. Massaging helped finger by finger. MRI cervical spine was normal.  She has weakness in her hands. Progressively worsening. Pain in both shoulders. Some numbness in the first 4 digits. No prior diagnosis of CTS. No injury. She also felt a big knot on the inside of her wrist. Like a muscle spasm. No other focal neurologic deficits, associated symptoms, inciting events or modifiable factors.   Personally reviewed MRI cervical spine 07/08/2019: Normal  Cbc/cmp normal 03/2019  Interval history 06/08/2019: Carotid arteries on CTA c/w FMD. Discussed Fibromuscular Dysplasia, sent to Dr. Estanislado Pandy and being followed there. She has sharp pain in the back of the head, stops her because it is so severe, so painful it stops her in her tracks. Leaves her weak afterwards. Can go weeks without it. It is random, unknown what triggers, brief, severe. Also continues to have  migraines, left sided, pulsating, pounding, contsant pain, behind the eye, feels she may get ptosis, no lacrimation or rhinorrhea. She has light and sound sensitivity. She has daily headaches, no medication overuse, no aura. She has 8 migraine days a month with moderate or severe quality and can last 24-72 hours ongoing for > year at this frequency and severity and quality. For FMD and BO meds and a daily asa. Tried atenolol, relpax, telmisartan, verapamil, nortriptyline, topamax. She has some brain fog, discussed normal aging, chronic pain and lifestyle. She has had neck pain, she has numbness and tingling in the left hand and arm, ongoing for >  Months, failed conservative measures, under the care of a physician.    HPI:  HOPE BRANDENBURGER is a 53 y.o. female here as requested by Dr. Quay Burow for headaches. PMHx RA, bronchiectasis, polymyositis on imuran follows with Rheumatology, never smoked, interstitial lung disease, fibromyalgia, migraines, mitral valve prolapse, abnormal liver function test. She was supposed to have a sleep study but did not complete that (per notes [name]).  She reports she has headaches that come on during exertion and dissipate without medication when she slows down or alters activities. These are different from her usual migraines. She just had sleep test a few weeks ago, snoring, stops breathing. She does not have the results. She wakes up at night feeling like she can't breathe. She wakes with with "new" headache in the setting of sleep problems for at least 5-6 months. She may have the same headache later in the day. The headache is a fogginedd across  the forehead and more on the left.She wakes with this headache in the setting if snoring and  lung problems. The headache is worse with exertion, walking and talking, worse with valsalva and also with sex/orgasm. Rest helps and sitting still and it dissipates slowly. Can be moderately severe. Here with husband who provides much  information.  Headache medications used: Atenolol, Flexeril, Relpax,  Reviewed notes, labs and imaging from outside physicians, which showed:  Personally reviewed images from 2013 and agree with the following   Reviewed [name] notes.  Last time she was seen was 07/28/2018.  Patient has a history of rheumatoid arthritis, polymyositis followed in rheumatology.  Also bronchiectasis.  She was supposed to have a sleep study that was previously ordered but she did not.  Epworth Sleepiness Scale 7 06/16/2018.  ANA negative, rheumatoid factor XX 9, anti-CCP 213, SCL 70, SSA SSB negative, Jo 1+.  Patient's last echocardiogram 05/29/2017 with ejection fraction 60 to 65%, moderate left ventricular hypertrophy, grade 1 DD.  CT of the sinuses were negative for acute disease.   Called Dr. Juanetta Gosling office and requested results of sleep testing  Review of Systems: Patient complains of symptoms per HPI as well as the following symptoms: Headache, snoring, fatigue, shortness of breath, cough, wheezing, snoring, murmur, joint pain, joint swelling, cramps, aching muscles. Pertinent negatives and positives per HPI. All others negative.   Social History   Socioeconomic History  . Marital status: Married    Spouse name: Lylianna Fraiser  . Number of children: 1  . Years of education: Not on file  . Highest education level: High school graduate  Occupational History  . Occupation: Compliant and Injury Clerk  Tobacco Use  . Smoking status: Never Smoker  . Smokeless tobacco: Never Used  . Tobacco comment: Married, lives with spouse. 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 Alcohol use: Never    Comment: maybe a wine cooler once every other year  . Drug use: Never  . Sexual activity: Yes    Birth control/protection: Surgical  Other Topics Concern  . Not on file  Social History Narrative   Exercise: trying to walk - limited by fatigue   Lives  at home with husband and daughter   Right handed   Caffeine: occasional coke 1-2x per month   Social Determinants of Health   Financial Resource Strain:   . Difficulty of Paying Living Expenses: Not on file  Food Insecurity:   . Worried About Charity fundraiser in the Last Year: Not on file  . Ran Out of Food in the Last Year: Not on file  Transportation Needs:   . Lack of Transportation (Medical): Not on file  . Lack of Transportation (Non-Medical): Not on file  Physical Activity:   . Days of Exercise per Week: Not on file  . Minutes of Exercise per Session: Not on file  Stress:   . Feeling of Stress : Not on file  Social Connections:   . Frequency of Communication with Friends and Family: Not on file  . Frequency of Social Gatherings with Friends and Family: Not on file  . Attends Religious Services: Not on file  . Active Member of Clubs or Organizations: Not on file  . Attends Archivist Meetings: Not on file  . Marital Status: Not on file  Intimate Partner Violence:   . Fear of Current or Ex-Partner: Not on file  . Emotionally Abused: Not on file  . Physically Abused: Not on file  . Sexually Abused: Not on  file    Family History  Problem Relation Age of Onset  . Diabetes Mother   . Fibromyalgia Mother   . Ulcers Mother   . Heart failure Mother   . Thyroid disease Mother   . Obesity Mother   . Multiple myeloma Father   . Hypertension Father   . Stroke Father   . Lupus Sister   . Other Sister        abdominal adhesions resulting in bowel obstruction  . Migraines Sister   . Heart disease Brother        bypass surgery  . Rheum arthritis Sister   . Multiple myeloma Sister   . Hypertension Sister   . Heart attack Sister   . Diabetes Sister   . Hypertension Sister   . Rheum arthritis Sister   . Diabetes Sister   . Diabetes Brother   . Headache Other        siblings with headaches but not diagnosed as migraines    Past Medical History:  Diagnosis  Date  . Abnormal liver function test   . Bronchiectasis       . Dyspnea   . Fibromuscular dysplasia ([place])   . Fibromyalgia   . Heart murmur   . ILD (interstitial lung disease) ([place])   . Joint pain   . Menorrhagia   . MIGRAINE HEADACHE   . MVP (mitral valve prolapse)   . Osteoarthritis   . Polymyositis ([place])    Dr [name]; chronic MTX  . Pulmonary fibrosis ([place])   . Rheumatoid arthritis ([place])   . Sleep apnea   . Vitamin B 12 deficiency   . Vitamin D deficiency     Past Surgical History:  Procedure Laterality Date  . ablation uterine  2010  . CARDIAC CATHETERIZATION  2002   normal  . DILATION AND CURETTAGE OF UTERUS  08-31-08   Dr [name]  . IR ANGIO INTRA EXTRACRAN SEL COM CAROTID INNOMINATE BILAT MOD SED  09/18/2018  . IR ANGIO VERTEBRAL SEL VERTEBRAL BILAT MOD SED  09/18/2018  . IR US GUIDE VASC ACCESS RIGHT  09/18/2018    Current Outpatient Medications  Medication Sig Dispense Refill  . albuterol (PROVENTIL HFA) 108 (90 Base) MCG/ACT inhaler Inhale 2 puffs into the lungs 4 (four) times daily as needed. Reported on 09/03/2015 1 Inhaler 4  . albuterol (PROVENTIL) (2.5 MG/3ML) 0.083% nebulizer solution Take 3 mLs (2.5 mg total) by nebulization every 6 (six) hours as needed for wheezing or shortness of breath. 120 vial 5  . aspirin EC 81 MG tablet Take 81 mg by mouth daily.    [name] atenolol (TENORMIN) 25 MG tablet Take 0.5 tablets (12.5 mg total) by mouth daily as needed (mitral prolapse). 45 tablet 0  . azaTHIOprine (IMURAN) 50 MG tablet TAKE 2 TABLETS(100 MG) BY MOUTH DAILY 60 tablet 0  . BREO ELLIPTA 100-25 MCG/INH AEPB INHALE 1 PUFF INTO THE LUNGS DAILY 60 each 5  . eletriptan (RELPAX) 20 MG tablet One tablet by mouth at onset of headache. May repeat in 2 hours if headache persists or recurs. 10 tablet 0  . folic acid (FOLVITE) 1 MG tablet TAKE 2 TABLETS(2 MG) BY MOUTH DAILY 180 tablet 3  . Fremanezumab-vfrm (AJOVY) 225 MG/1.5ML SOAJ Inject 225 mg into the skin  every 30 (thirty) days. 1 pen 11  . guaiFENesin (MUCINEX) 600 MG 12 hr tablet Take 2 tablets (1,200 mg total) by mouth 2 (two) times daily as needed for cough or  to loosen phlegm. (Patient taking differently: Take 1,200 mg by mouth 2 (two) times daily. )    . metFORMIN (GLUCOPHAGE) 500 MG tablet Take 1 tablet (500 mg total) by mouth 2 (two) times daily with a meal. 60 tablet 0  . Multiple Vitamins-Minerals (MULTIVITAMIN GUMMIES ADULT PO) Take 2 each by mouth daily.    [name] Respiratory Therapy Supplies (FLUTTER) DEVI Use as directed (Patient taking differently: 2 (two) times daily. Use as directed) 1 each 0  . telmisartan (MICARDIS) 20 MG tablet TAKE 1 TABLET(20 MG) BY MOUTH DAILY 90 tablet 1   No current facility-administered medications for this visit.    Allergies as of 07/20/2019 - Review Complete 07/20/2019  Allergen Reaction Noted  . Sulfonamide derivatives      Vitals: BP (!) 143/78 (BP Location: Right Arm, Patient Position: Sitting, Cuff Size: Large)   Pulse 60   Temp (!) 96.8 F (36 C) Comment: taken at front door  Ht _0  (1.727 m)   Wt 255 lb (115.7 kg)   BMI 38.77 kg/m  Last Weight:  Wt Readings from Last 1 Encounters:  07/20/19 255 lb (115.7 kg)   Last Height:   Ht Readings from Last 1 Encounters:  07/20/19 _1  (1.727 m)   Physical exam: Exam: Gen: NAD, conversant, well nourised, obese, well groomed                     CV: RRR, no MRG. No Carotid Bruits. No peripheral edema, warm, nontender Eyes: Conjunctivae clear without exudates or hemorrhage  Neuro: Detailed Neurologic Exam  Speech:    Speech is normal; fluent and spontaneous with normal comprehension.  Cognition:    The patient is oriented to person, place, and time;     recent and remote memory intact;     language fluent;     normal attention, concentration,     fund of knowledge Cranial Nerves:    The pupils are equal, round, and reactive to light.  Visual fields are full to finger confrontation.  Extraocular movements are intact. Trigeminal sensation is intact and the muscles of mastication are normal. The face is symmetric. The palate elevates in the midline. Hearing intact. Voice is normal. Shoulder shrug is normal. The tongue has normal motion without fasciculations.   Motor Observation:    No asymmetry, no atrophy, and no involuntary movements noted. Tone:    Normal muscle tone.    Posture:    Posture is normal.    Strength: left arm proximal weakness otherwise strength is V/V in the upper and lower limbs.      Sensation: intact to LT      Assessment/Plan:   53 y.o. female here as requested by Dr. Quay Burow for headaches and now here for new request of evaluation of hand pain. PMHx RA, bronchiectasis, polymyositis on imuran follows with Rheumatology, never smoked, interstitial lung disease, fibromyalgia, migraines, mitral valve prolapse, abnormal liver function test. She has migraines and occipital neuralgia.   - Right hand pain and cramping, worse at night. Suspect CTS, order emg/ncs  Orders Placed This Encounter  Procedures  . NCV with EMG(electromyography)   PRIOR:  - She completed sleep evaluation, AHI 5, decreased O2 at nigh, on O2 now at night - FMD: ASA and HTN control, repeat CTA head and neck due to new symptoms - Migraines and occipital neuralgia: Start Ajovy for migraines.  - MRI cervical spine for cervical radic.  She has had neck pain, she has numbness  and tingling in the left hand and arm, ongoing for >  6 Months, failed conservative measures, under the care of a physician for years for this.  Needs MRI c-spine for cervical radic as well as cervico-occipital pain. - She has some brain fog, discussed normal aging, chronic pain and lifestyle factors. B12 575.  - Obesity: healthy weight and wellness center referral, doing great as lost 25 pounds. - repeat CTAngio of the head and neck due to pain radiating in the neck, for surveillance of her FMD and to rule out any  carotid involvement (feels a "cord" where the carotid artery shows, rule out carotid dissection has FMD in the carotids)  To prevent or relieve headaches, try the following: Cool Compress. Lie down and place a cool compress on your head.  Avoid headache triggers. If certain foods or odors seem to have triggered your migraines in the past, avoid them. A headache diary might help you identify triggers.  Include physical activity in your daily routine. Try a daily walk or other moderate aerobic exercise.  Manage stress. Find healthy ways to cope with the stressors, such as delegating tasks on your to-do list.  Practice relaxation techniques. Try deep breathing, yoga, massage and visualization.  Eat regularly. Eating regularly scheduled meals and maintaining a healthy diet might help prevent headaches. Also, drink plenty of fluids.  Follow a regular sleep schedule. Sleep deprivation might contribute to headaches Consider biofeedback. With this mind-body technique, you learn to control certain bodily functions -- such as muscle tension, heart rate and blood pressure -- to prevent headaches or reduce headache pain.    Proceed to emergency room if you experience new or worsening symptoms or symptoms do not resolve, if you have new neurologic symptoms or if headache is severe, or for any concerning symptom.   Provided education and documentation from American headache Society toolbox including articles on: chronic migraine medication overuse headache, chronic migraines, prevention of migraines, behavioral and other nonpharmacologic treatments for headache.    Cc:   Binnie Rail, MD Lauraine Rinne NP, Dr.Deveshwar  Sarina Ill, MD  [facility] [address] [place], [place] [address]  Phone [phone] Fax [phone]  A total of 45 minutes was spent face-to-face with this patient. Over half this time was spent on counseling patient on the  1. Pain of right  hand    diagnosis and different diagnostic and therapeutic options, counseling and coordination of care, risks ans benefits of management, compliance, or risk factor reduction and education.

## 2019-07-21 ENCOUNTER — Encounter: Payer: Self-pay | Admitting: Neurology

## 2019-07-21 ENCOUNTER — Other Ambulatory Visit: Payer: Self-pay

## 2019-07-21 ENCOUNTER — Ambulatory Visit (INDEPENDENT_AMBULATORY_CARE_PROVIDER_SITE_OTHER): Payer: Federal, State, Local not specified - PPO | Admitting: Neurology

## 2019-07-21 ENCOUNTER — Ambulatory Visit: Payer: Federal, State, Local not specified - PPO | Admitting: Neurology

## 2019-07-21 DIAGNOSIS — M79641 Pain in right hand: Secondary | ICD-10-CM

## 2019-07-21 DIAGNOSIS — G5601 Carpal tunnel syndrome, right upper limb: Secondary | ICD-10-CM

## 2019-07-21 DIAGNOSIS — Z0289 Encounter for other administrative examinations: Secondary | ICD-10-CM

## 2019-07-21 DIAGNOSIS — G5603 Carpal tunnel syndrome, bilateral upper limbs: Secondary | ICD-10-CM | POA: Insufficient documentation

## 2019-07-21 NOTE — Progress Notes (Signed)
See procedure note.

## 2019-07-21 NOTE — Patient Instructions (Signed)
Right moderately severe carpal tunnel syndrome Left asymptomatic ulnar neuropathy Referral to Dr. Vertell Limber in Neurosurgery Wear a wrist splint  Ulnar Neuropathy at Elbow  Cubital tunnel syndrome is a condition that causes pain and weakness of the forearm and hand. It happens when one of the nerves that runs along the inside of the elbow joint (ulnar nerve) becomes irritated. This condition is usually caused by repeated arm motions that are done during sports or work-related activities. What are the causes? This condition may be caused by:  Increased pressure on the ulnar nerve at the elbow, arm, or forearm. This can result from: ? Irritation caused by repeated elbow bending. ? Poorly healed elbow fractures. ? Tumors in the elbow. These are usually noncancerous (benign). ? Scar tissue that develops in the elbow after an injury. ? Bony growths (spurs) near the ulnar nerve.  Stretching of the nerve due to loose elbow ligaments.  Trauma to the nerve at the elbow. What increases the risk? The following factors may make you more likely to develop this condition:  Doing manual labor that requires frequent bending of the elbow.  Playing sports that include repeated or strenuous throwing motions, such as baseball.  Playing contact sports, such as football or lacrosse.  Not warming up properly before activities.  Having diabetes.  Having an underactive thyroid (hypothyroidism). What are the signs or symptoms? Symptoms of this condition include:  Clumsiness or weakness of the hand.  Tenderness of the inner elbow.  Aching or soreness of the inner elbow, forearm, or fingers, especially the little finger or the ring finger.  Increased pain when forcing the elbow to bend.  Reduced control when throwing objects.  Tingling, numbness, or a burning feeling inside the forearm or in part of the hand or fingers, especially the little finger or the ring finger.  Sharp pains that shoot from  the elbow down to the wrist and hand.  The inability to grip or pinch hard. How is this diagnosed? This condition is diagnosed based on:  Your symptoms and medical history. Your health care provider will also ask for details about any injury.  A physical exam. You may also have tests, including:  Electromyogram (EMG). This test measures electrical signals sent by your nerves into the muscles.  Nerve conduction study. This test measures how well electrical signals pass through your nerves.  Imaging tests, such as X-rays, ultrasound, and MRI. These tests check for possible causes of your condition. How is this treated? This condition may be treated by:  Stopping the activities that are causing your symptoms to get worse.  Icing and taking medicines to reduce pain and swelling.  Wearing a splint to prevent your elbow from bending, or wearing an elbow pad where the ulnar nerve is closest to the skin.  Working with a physical therapist in less severe cases. This may help to: ? Decrease your symptoms. ? Improve the strength and range of motion of your elbow, forearm, and hand. If these treatments do not help, surgery may be needed. Follow these instructions at home: If you have a splint:  Wear the splint as told by your health care provider. Remove it only as told by your health care provider.  Loosen the splint if your fingers tingle, become numb, or turn cold and blue.  Keep the splint clean.  If the splint is not waterproof: ? Do not let it get wet. ? Cover it with a watertight covering when you take a bath or shower.  Managing pain, stiffness, and swelling   If directed, put ice on the injured area: ? Put ice in a plastic bag. ? Place a towel between your skin and the bag. ? Leave the ice on for 20 minutes, 2-3 times a day.  Move your fingers often to avoid stiffness and to lessen swelling.  Raise (elevate) the injured area above the level of your heart while you are  sitting or lying down. General instructions  Take over-the-counter and prescription medicines only as told by your health care provider.  Do any exercise or physical therapy as told by your health care provider.  Do not drive or use heavy machinery while taking prescription pain medicine.  If you were given an elbow pad, wear it as told by your health care provider.  Keep all follow-up visits as told by your health care provider. This is important. Contact a health care provider if:  Your symptoms get worse.  Your symptoms do not get better with treatment.  You have new pain.  Your hand on the injured side feels numb or cold. Summary  Cubital tunnel syndrome is a condition that causes pain and weakness of the forearm and hand.  You are more likely to develop this condition if you do work or play sports that involve repeated arm movements.  This condition is often treated by stopping repetitive activities, applying ice, and using anti-inflammatory medicines.  In rare cases, surgery may be needed. This information is not intended to replace advice given to you by your health care provider. Make sure you discuss any questions you have with your health care provider. Document Released: 07/23/2005 Document Revised: 12/09/2017 Document Reviewed: 12/09/2017 Elsevier Patient Education  [place] tunnel syndrome is a condition that causes pain in your hand and arm. The carpal tunnel is a narrow area that is on the palm side of your wrist. Repeated wrist motion or certain diseases may cause swelling in the tunnel. This swelling can pinch the main nerve in the wrist (median nerve). What are the causes? This condition may be caused by:  Repeated wrist motions.  Wrist injuries.  Arthritis.  A sac of fluid (cyst) or abnormal growth (tumor) in the carpal tunnel.  Fluid buildup during pregnancy. Sometimes the cause is not known. What  increases the risk? The following factors may make you more likely to develop this condition:  Having a job in which you move your wrist in the same way many times. This includes jobs like being a Software engineer or a Scientist, water quality.  Being a woman.  Having other health conditions, such as: ? Diabetes. ? Obesity. ? A thyroid gland that is not active enough (hypothyroidism). ? Kidney failure. What are the signs or symptoms? Symptoms of this condition include:  A tingling feeling in your fingers.  Tingling or a loss of feeling (numbness) in your hand.  Pain in your entire arm. This pain may get worse when you bend your wrist and elbow for a long time.  Pain in your wrist that goes up your arm to your shoulder.  Pain that goes down into your palm or fingers.  A weak feeling in your hands. You may find it hard to grab and hold items. You may feel worse at night. How is this diagnosed? This condition is diagnosed with a medical history and physical exam. You may also have tests, such as:  Electromyogram (EMG). This test checks the signals that the nerves send  to the muscles.  Nerve conduction study. This test checks how well signals pass through your nerves.  Imaging tests, such as X-rays, ultrasound, and MRI. These tests check for what might be the cause of your condition. How is this treated? This condition may be treated with:  Lifestyle changes. You will be asked to stop or change the activity that caused your problem.  Doing exercise and activities that make bones and muscles stronger (physical therapy).  Learning how to use your hand again (occupational therapy).  Medicines for pain and swelling (inflammation). You may have injections in your wrist.  A wrist splint.  Surgery. Follow these instructions at home: If you have a splint:  Wear the splint as told by your doctor. Remove it only as told by your doctor.  Loosen the splint if your fingers: ? Tingle. ? Lose feeling  (become numb). ? Turn cold and blue.  Keep the splint clean.  If the splint is not waterproof: ? Do not let it get wet. ? Cover it with a watertight covering when you take a bath or a shower. Managing pain, stiffness, and swelling   If told, put ice on the painful area: ? If you have a removable splint, remove it as told by your doctor. ? Put ice in a plastic bag. ? Place a towel between your skin and the bag. ? Leave the ice on for 20 minutes, 2-3 times per day. General instructions  Take over-the-counter and prescription medicines only as told by your doctor.  Rest your wrist from any activity that may cause pain. If needed, talk with your boss at work about changes that can help your wrist heal.  Do any exercises as told by your doctor, physical therapist, or occupational therapist.  Keep all follow-up visits as told by your doctor. This is important. Contact a doctor if:  You have new symptoms.  Medicine does not help your pain.  Your symptoms get worse. Get help right away if:  You have very bad numbness or tingling in your wrist or hand. Summary  Carpal tunnel syndrome is a condition that causes pain in your hand and arm.  It is often caused by repeated wrist motions.  Lifestyle changes and medicines are used to treat this problem. Surgery may help in very bad cases.  Follow your doctor's instructions about wearing a splint, resting your wrist, keeping follow-up visits, and calling for help. This information is not intended to replace advice given to you by your health care provider. Make sure you discuss any questions you have with your health care provider. Document Released: 07/12/2011 Document Revised: 11/29/2017 Document Reviewed: 11/29/2017 Elsevier Patient Education  2020 [employer].

## 2019-07-21 NOTE — Patient Instructions (Signed)
Emg/ncs   Electromyoneurogram Electromyoneurogram is a test to check how well your muscles and nerves are working. This procedure includes the combined use of electromyogram (EMG) and nerve conduction study (NCS). EMG is used to look for muscular disorders. NCS, which is also called electroneurogram, measures how well your nerves are controlling your muscles. The procedures are usually done together to check if your muscles and nerves are healthy. If the results of the tests are abnormal, this may indicate disease or injury, such as a neuromuscular disease or peripheral nerve damage. Tell a health care provider about:  Any allergies you have.  All medicines you are taking, including vitamins, herbs, eye drops, creams, and over-the-counter medicines.  Any problems you or family members have had with anesthetic medicines.  Any blood disorders you have.  Any surgeries you have had.  Any medical conditions you have.  If you have a pacemaker.  Whether you are pregnant or may be pregnant. What are the risks? Generally, this is a safe procedure. However, problems may occur, including:  Infection where the electrodes were inserted.  Bleeding. What happens before the procedure? Medicines Ask your health care provider about:  Changing or stopping your regular medicines. This is especially important if you are taking diabetes medicines or blood thinners.  Taking medicines such as aspirin and ibuprofen. These medicines can thin your blood. Do not take these medicines unless your health care provider tells you to take them.  Taking over-the-counter medicines, vitamins, herbs, and supplements. General instructions  Your health care provider may ask you to avoid: ? Beverages that have caffeine, such as coffee and tea. ? Any products that contain nicotine or tobacco. These products include cigarettes, e-cigarettes, and chewing tobacco. If you need help quitting, ask your health care  provider.  Do not use lotions or creams on the same day that you will be having the procedure. What happens during the procedure? For EMG   Your health care provider will ask you to stay in a position so that he or she can access the muscle that will be studied. You may be standing, sitting, or lying down.  You may be given a medicine that numbs the area (local anesthetic).  A very thin needle that has an electrode will be inserted into your muscle.  Another small electrode will be placed on your skin near the muscle.  Your health care provider will ask you to continue to remain still.  The electrodes will send a signal that tells about the electrical activity of your muscles. You may see this on a monitor or hear it in the room.  After your muscles have been studied at rest, your health care provider will ask you to contract or flex your muscles. The electrodes will send a signal that tells about the electrical activity of your muscles.  Your health care provider will remove the electrodes and the electrode needles when the procedure is finished. The procedure may vary among health care providers and hospitals. For NCS   An electrode that records your nerve activity (recording electrode) will be placed on your skin by the muscle that is being studied.  An electrode that is used as a reference (reference electrode) will be placed near the recording electrode.  A paste or gel will be applied to your skin between the recording electrode and the reference electrode.  Your nerve will be stimulated with a mild shock. Your health care provider will measure how much time it takes for your  muscle to react.  Your health care provider will remove the electrodes and the gel when the procedure is finished. The procedure may vary among health care providers and hospitals. What happens after the procedure?  It is up to you to get the results of your procedure. Ask your health care provider,  or the department that is doing the procedure, when your results will be ready.  Your health care provider may: ? Give you medicines for any pain. ? Monitor the insertion sites to make sure that bleeding stops. Summary  Electromyoneurogram is a test to check how well your muscles and nerves are working.  If the results of the tests are abnormal, this may indicate disease or injury.  This is a safe procedure. However, problems may occur, such as bleeding and infection.  Your health care provider will do two tests to complete this procedure. One checks your muscles (EMG) and another checks your nerves (NCS).  It is up to you to get the results of your procedure. Ask your health care provider, or the department that is doing the procedure, when your results will be ready. This information is not intended to replace advice given to you by your health care provider. Make sure you discuss any questions you have with your health care provider. Document Released: 11/23/2004 Document Revised: 04/08/2018 Document Reviewed: 03/21/2018 Elsevier Patient Education  2020 [employer].

## 2019-07-22 ENCOUNTER — Other Ambulatory Visit: Payer: Self-pay | Admitting: Neurology

## 2019-07-22 DIAGNOSIS — G5600 Carpal tunnel syndrome, unspecified upper limb: Secondary | ICD-10-CM

## 2019-07-23 ENCOUNTER — Telehealth: Payer: Self-pay | Admitting: Neurology

## 2019-07-23 DIAGNOSIS — Z79899 Other long term (current) drug therapy: Secondary | ICD-10-CM | POA: Diagnosis not present

## 2019-07-23 NOTE — Telephone Encounter (Signed)
Pt called in and stated her results from the Nerve Conduction Study need to be faxed to [facility]. She states she didn't receive a fax number from them.

## 2019-07-24 LAB — CMP14+EGFR
ALT: 17 IU/L (ref 0–32)
AST: 23 IU/L (ref 0–40)
Albumin/Globulin Ratio: 1.4 (ref 1.2–2.2)
Albumin: 4.6 g/dL (ref 3.8–4.9)
Alkaline Phosphatase: 87 IU/L (ref 39–117)
BUN/Creatinine Ratio: 23 (ref 9–23)
BUN: 12 mg/dL (ref 6–24)
Bilirubin Total: 0.5 mg/dL (ref 0.0–1.2)
CO2: 22 mmol/L (ref 20–29)
Calcium: 9.9 mg/dL (ref 8.7–10.2)
Chloride: 104 mmol/L (ref 96–106)
Creatinine, Ser: 0.53 mg/dL — ABNORMAL LOW (ref 0.57–1.00)
GFR calc Af Amer: 125 mL/min/{1.73_m2} (ref >59)
GFR calc non Af Amer: 109 mL/min/{1.73_m2} (ref >59)
Globulin, Total: 3.2 g/dL (ref 1.5–4.5)
Glucose: 90 mg/dL (ref 65–99)
Potassium: 4.8 mmol/L (ref 3.5–5.2)
Sodium: 141 mmol/L (ref 134–144)
Total Protein: 7.8 g/dL (ref 6.0–8.5)

## 2019-07-24 LAB — CBC WITH DIFFERENTIAL/PLATELET
Basophils Absolute: 0 10*3/uL (ref 0.0–0.2)
Basos: 1 %
EOS (ABSOLUTE): 0.1 10*3/uL (ref 0.0–0.4)
Eos: 1 %
Hematocrit: 43.8 % (ref 34.0–46.6)
Hemoglobin: 14.3 g/dL (ref 11.1–15.9)
Immature Grans (Abs): 0 10*3/uL (ref 0.0–0.1)
Immature Granulocytes: 0 %
Lymphocytes Absolute: 1.2 10*3/uL (ref 0.7–3.1)
Lymphs: 17 %
MCH: 28.8 pg (ref 26.6–33.0)
MCHC: 32.6 g/dL (ref 31.5–35.7)
MCV: 88 fL (ref 79–97)
Monocytes Absolute: 0.6 10*3/uL (ref 0.1–0.9)
Monocytes: 8 %
Neutrophils Absolute: 5 10*3/uL (ref 1.4–7.0)
Neutrophils: 73 %
Platelets: 292 10*3/uL (ref 150–450)
RBC: 4.96 x10E6/uL (ref 3.77–5.28)
RDW: 13.5 % (ref 11.7–15.4)
WBC: 6.8 10*3/uL (ref 3.4–10.8)

## 2019-07-27 DIAGNOSIS — M79641 Pain in right hand: Secondary | ICD-10-CM | POA: Insufficient documentation

## 2019-07-27 DIAGNOSIS — G5601 Carpal tunnel syndrome, right upper limb: Secondary | ICD-10-CM | POA: Diagnosis not present

## 2019-07-27 DIAGNOSIS — G5622 Lesion of ulnar nerve, left upper limb: Secondary | ICD-10-CM | POA: Insufficient documentation

## 2019-07-28 DIAGNOSIS — Z6839 Body mass index (BMI) 39.0-39.9, adult: Secondary | ICD-10-CM | POA: Diagnosis not present

## 2019-07-28 DIAGNOSIS — Z01419 Encounter for gynecological examination (general) (routine) without abnormal findings: Secondary | ICD-10-CM | POA: Diagnosis not present

## 2019-07-28 DIAGNOSIS — E8881 Metabolic syndrome: Secondary | ICD-10-CM | POA: Insufficient documentation

## 2019-07-28 DIAGNOSIS — Z1231 Encounter for screening mammogram for malignant neoplasm of breast: Secondary | ICD-10-CM | POA: Diagnosis not present

## 2019-07-29 ENCOUNTER — Other Ambulatory Visit: Payer: Self-pay

## 2019-07-29 ENCOUNTER — Encounter (INDEPENDENT_AMBULATORY_CARE_PROVIDER_SITE_OTHER): Payer: Self-pay | Admitting: Family Medicine

## 2019-07-29 ENCOUNTER — Telehealth (INDEPENDENT_AMBULATORY_CARE_PROVIDER_SITE_OTHER): Payer: Federal, State, Local not specified - PPO | Admitting: Family Medicine

## 2019-07-29 DIAGNOSIS — E8881 Metabolic syndrome: Secondary | ICD-10-CM | POA: Diagnosis not present

## 2019-07-29 DIAGNOSIS — Z6838 Body mass index (BMI) 38.0-38.9, adult: Secondary | ICD-10-CM | POA: Diagnosis not present

## 2019-07-29 DIAGNOSIS — G4733 Obstructive sleep apnea (adult) (pediatric): Secondary | ICD-10-CM | POA: Diagnosis not present

## 2019-07-29 DIAGNOSIS — J471 Bronchiectasis with (acute) exacerbation: Secondary | ICD-10-CM | POA: Diagnosis not present

## 2019-08-01 NOTE — Procedures (Signed)
Full Name: Jill Harper Gender: Female MRN #: [id number] Date of Birth: [birth year]    Visit Date: 07/21/2019 10:44 Age: 53 Years Examining Physician: Sarina Ill, MD  Referring physician: Bo Merino, MD    History: Patient with right hand pain Summary: Nerve Conduction Studies were performed on the bilateral upper extremities.  The right median APB motor nerve showed prolonged distal onset latency (4.6 ms, N<4.4). The left  median APB motor nerve showed prolonged distal onset latency (4.8 ms, N<4.4). The left Ulnar motor nerve showed prolonged distal onset latency (3.7 ms, N<3.3) and decreased conduction velocity (above-below elbow, 4m/s, N>49) and a 22 m/s drop across the elbow. The right Median 2nd Digit orthodromic sensory nerve showed prolonged distal peak latency (3.8 ms, N<3.4).The left  Median 2nd Digit orthodromic sensory nerve showed prolonged distal peak latency (3.9 ms, N<3.4). The left Ulnar 2nd Digit orthodromic sensory nerve showed prolonged distal peak latency (3.9 ms, N<3.1) and reduced amplitude(3uV, N>5). The right median/ulnar (palm) comparison nerve showed prolonged distal peak latency (Median Palm, 2.7 ms, N<2.2) and abnormal peak latency difference (Median Palm-Ulnar Palm, 0.6 ms, N<0.4) with a relative median delay.  The left median/ulnar (palm) comparison nerve showed prolonged distal peak latency (Median Palm, 2.6 ms, N<2.2) and prolonged distal peak latency (Ulnar Palm, 2.6 ms, N<2.2). All remaining nerves (as indicated in the following tables) were within normal limits.     Conclusion: There is electrophysiologic evidence of bilateral moderately-severe Carpal Tunnel Syndrome.  There is also concomitant left Ulnar neuropathy with a 27m/s drop in conduction velocity across the elbow. No suggestion of cervical radiculopathy.    Sarina Ill, M.D.  [facility] [place], [place] [address] Tel: [phone] Fax: [phone]        [facility]    Nerve / Sites Muscle Latency Ref. Amplitude Ref. Rel Amp Segments Distance Velocity Ref. Area    ms ms mV mV %  cm m/s m/s mVms  R Median - APB     Wrist APB 4.6 ?4.4 8.1 ?4.0 100 Wrist - APB 7   32.2     Upper arm APB 9.0  5.9  72.1 Upper arm - Wrist 24 54 ?49 22.9  L Median - APB     Wrist APB 4.8 ?4.4 9.6 ?4.0 100 Wrist - APB 7   38.1     Upper arm APB 9.3  6.4  67.4 Upper arm - Wrist 23 51 ?49 25.5  R Ulnar - ADM     Wrist ADM 2.9 ?3.3 12.5 ?6.0 100 Wrist - ADM 7   39.0     B.Elbow ADM 7.0  11.4  91.1 B.Elbow - Wrist 20 49 ?49 37.8     A.Elbow ADM 9.1  11.1  97.9 A.Elbow - B.Elbow 10 49 ?49 37.6         A.Elbow - Wrist      L Ulnar - ADM     Wrist ADM 3.7 ?3.3 10.5 ?6.0 100 Wrist - ADM 7   35.8     B.Elbow ADM 7.8  7.4  69.8 B.Elbow - Wrist 20 49 ?49 24.8     A.Elbow ADM 11.5  7.4  100 A.Elbow - B.Elbow 10 27 ?49 30.8         A.Elbow - Wrist                 SNC    Nerve / Sites Rec. Site Peak Lat Ref.  Amp  Ref. Segments Distance Peak Diff Ref.    ms ms V V  cm ms ms  R Median, Ulnar - Transcarpal comparison     Median Palm Wrist 2.7 ?2.2 63 ?35 Median Palm - Wrist 8       Ulnar Palm Wrist 2.1 ?2.2 14 ?12 Ulnar Palm - Wrist 8          Median Palm - Ulnar Palm  0.6 ?0.4  L Median, Ulnar - Transcarpal comparison     Median Palm Wrist 2.6 ?2.2 90 ?35 Median Palm - Wrist 8       Ulnar Palm Wrist 2.6 ?2.2 3 ?12 Ulnar Palm - Wrist 8          Median Palm - Ulnar Palm  0.0 ?0.4  R Median - Orthodromic (Dig II, Mid palm)     Dig II Wrist 3.8 ?3.4 15 ?10 Dig II - Wrist 13    L Median - Orthodromic (Dig II, Mid palm)     Dig II Wrist 3.9 ?3.4 10 ?10 Dig II - Wrist 13    R Ulnar - Orthodromic, (Dig V, Mid palm)     Dig V Wrist 3.1 ?3.1 8 ?5 Dig V - Wrist 11    L Ulnar - Orthodromic, (Dig V, Mid palm)     Dig V Wrist 3.9 ?3.1 3 ?5 Dig V - Wrist 59                   F  Wave    Nerve F Lat Ref.   ms ms  R Ulnar - ADM 29.2 ?32.0  L Ulnar - ADM 28.1 ?32.0           EMG Summary Table    Spontaneous MUAP Recruitment  Muscle IA Fib PSW Fasc Other Amp Dur. Poly Pattern  Bilat. Deltoid Normal None None None _______ Normal Normal Normal Normal  Bilat. Cervical paraspinals (low) Normal None None None _______ Normal Normal Normal Normal  Bilat. Triceps brachii Normal None None None _______ Normal Normal Normal Normal  Bilat. Pronator teres Normal None None None _______ Normal Normal Normal Normal  Bilat First dorsal interosseous Normal None None None _______ Normal Normal Normal Normal  Bilat. Opponens pollicis Normal None None None _______ Normal Normal Normal Normal

## 2019-08-01 NOTE — Progress Notes (Signed)
Full Name: Jill Harper Gender: Female MRN #: [id number] Date of Birth: May 09, 1966    Visit Date: 07/21/2019 10:44 Age: 53 Years Examining Physician: Sarina Ill, MD  Referring physician: Bo Merino, MD    History: Patient with right hand pain Summary: Nerve Conduction Studies were performed on the bilateral upper extremities.  The right median APB motor nerve showed prolonged distal onset latency (4.6 ms, N<4.4). The left  median APB motor nerve showed prolonged distal onset latency (4.8 ms, N<4.4). The left Ulnar motor nerve showed prolonged distal onset latency (3.7 ms, N<3.3) and decreased conduction velocity (above-below elbow, 82m/s, N>49) and a 22 m/s drop across the elbow. The right Median 2nd Digit orthodromic sensory nerve showed prolonged distal peak latency (3.8 ms, N<3.4).The left  Median 2nd Digit orthodromic sensory nerve showed prolonged distal peak latency (3.9 ms, N<3.4). The left Ulnar 2nd Digit orthodromic sensory nerve showed prolonged distal peak latency (3.9 ms, N<3.1) and reduced amplitude(3uV, N>5). The right median/ulnar (palm) comparison nerve showed prolonged distal peak latency (Median Palm, 2.7 ms, N<2.2) and abnormal peak latency difference (Median Palm-Ulnar Palm, 0.6 ms, N<0.4) with a relative median delay.  The left median/ulnar (palm) comparison nerve showed prolonged distal peak latency (Median Palm, 2.6 ms, N<2.2) and prolonged distal peak latency (Ulnar Palm, 2.6 ms, N<2.2). All remaining nerves (as indicated in the following tables) were within normal limits.     Conclusion: There is electrophysiologic evidence of bilateral moderately-severe Carpal Tunnel Syndrome.  There is also concomitant left Ulnar neuropathy with a 35m/s drop in conduction velocity across the elbow. No suggestion of cervical radiculopathy.    Sarina Ill, M.D.  [facility] [place], [place] [address] Tel: [phone] Fax: [phone]        [facility]    Nerve / Sites Muscle Latency Ref. Amplitude Ref. Rel Amp Segments Distance Velocity Ref. Area    ms ms mV mV %  cm m/s m/s mVms  R Median - APB     Wrist APB 4.6 ?4.4 8.1 ?4.0 100 Wrist - APB 7   32.2     Upper arm APB 9.0  5.9  72.1 Upper arm - Wrist 24 54 ?49 22.9  L Median - APB     Wrist APB 4.8 ?4.4 9.6 ?4.0 100 Wrist - APB 7   38.1     Upper arm APB 9.3  6.4  67.4 Upper arm - Wrist 23 51 ?49 25.5  R Ulnar - ADM     Wrist ADM 2.9 ?3.3 12.5 ?6.0 100 Wrist - ADM 7   39.0     B.Elbow ADM 7.0  11.4  91.1 B.Elbow - Wrist 20 49 ?49 37.8     A.Elbow ADM 9.1  11.1  97.9 A.Elbow - B.Elbow 10 49 ?49 37.6         A.Elbow - Wrist      L Ulnar - ADM     Wrist ADM 3.7 ?3.3 10.5 ?6.0 100 Wrist - ADM 7   35.8     B.Elbow ADM 7.8  7.4  69.8 B.Elbow - Wrist 20 49 ?49 24.8     A.Elbow ADM 11.5  7.4  100 A.Elbow - B.Elbow 10 27 ?49 30.8         A.Elbow - Wrist                 SNC    Nerve / Sites Rec. Site Peak Lat Ref.  Amp  Ref. Segments Distance Peak Diff Ref.    ms ms V V  cm ms ms  R Median, Ulnar - Transcarpal comparison     Median Palm Wrist 2.7 ?2.2 63 ?35 Median Palm - Wrist 8       Ulnar Palm Wrist 2.1 ?2.2 14 ?12 Ulnar Palm - Wrist 8          Median Palm - Ulnar Palm  0.6 ?0.4  L Median, Ulnar - Transcarpal comparison     Median Palm Wrist 2.6 ?2.2 90 ?35 Median Palm - Wrist 8       Ulnar Palm Wrist 2.6 ?2.2 3 ?12 Ulnar Palm - Wrist 8          Median Palm - Ulnar Palm  0.0 ?0.4  R Median - Orthodromic (Dig II, Mid palm)     Dig II Wrist 3.8 ?3.4 15 ?10 Dig II - Wrist 13    L Median - Orthodromic (Dig II, Mid palm)     Dig II Wrist 3.9 ?3.4 10 ?10 Dig II - Wrist 13    R Ulnar - Orthodromic, (Dig V, Mid palm)     Dig V Wrist 3.1 ?3.1 8 ?5 Dig V - Wrist 11    L Ulnar - Orthodromic, (Dig V, Mid palm)     Dig V Wrist 3.9 ?3.1 3 ?5 Dig V - Wrist 15                   F  Wave    Nerve F Lat Ref.   ms ms  R Ulnar - ADM 29.2 ?32.0  L Ulnar - ADM 28.1 ?32.0           EMG Summary Table    Spontaneous MUAP Recruitment  Muscle IA Fib PSW Fasc Other Amp Dur. Poly Pattern  Bilat. Deltoid Normal None None None _______ Normal Normal Normal Normal  Bilat. Cervical paraspinals (low) Normal None None None _______ Normal Normal Normal Normal  Bilat. Triceps brachii Normal None None None _______ Normal Normal Normal Normal  Bilat. Pronator teres Normal None None None _______ Normal Normal Normal Normal  Bilat First dorsal interosseous Normal None None None _______ Normal Normal Normal Normal  Bilat. Opponens pollicis Normal None None None _______ Normal Normal Normal Normal

## 2019-08-02 ENCOUNTER — Encounter: Payer: Self-pay | Admitting: Internal Medicine

## 2019-08-03 ENCOUNTER — Telehealth: Payer: Self-pay

## 2019-08-03 NOTE — Telephone Encounter (Signed)
Advised patient of information below and patient was seen by Dr. Fredna Dow last week.

## 2019-08-03 NOTE — Telephone Encounter (Signed)
-----   Message from Bo Merino, MD sent at 08/03/2019  9:52 AM EST ----- Patient has bilateral moderate to severe carpal tunnel syndrome.  Please notify patient.  Please schedule an appointment with Dr.Xu for bilateral carpal tunnel release unless patient prefers to go to other orthopedic surgeon. SD ----- Message ----- From: Melvenia Beam, MD Sent: 08/01/2019   6:20 PM EST To: Bo Merino, MD, Daryll Brod, MD, #

## 2019-08-07 DIAGNOSIS — J471 Bronchiectasis with (acute) exacerbation: Secondary | ICD-10-CM | POA: Diagnosis not present

## 2019-08-07 DIAGNOSIS — G4733 Obstructive sleep apnea (adult) (pediatric): Secondary | ICD-10-CM | POA: Diagnosis not present

## 2019-08-10 NOTE — Progress Notes (Signed)
Office: [phone]  /  Fax: [phone] TeleHealth Visit:  Jill Harper has verbally consented to this TeleHealth visit today. The patient is located at home, the provider is located at the [facility] office. The participants in this visit include the listed provider and patient and any and all parties involved. The visit was conducted today via telephone due to AV failure.  HPI:  Chief Complaint: OBESITY Jill Harper is here to discuss her progress with her obesity treatment plan. She is on the Category 2 Plan and states she is following her eating plan approximately 95 % of the time. She states she is exercising 0 minutes 0 times per week.  Jill Harper states her weight today at home is 255 pounds, which is up 2 pounds from her last telehealth visit approximately 3 weeks ago. Stress has been very high at work and with recently. She has been on prednisone for nerve pain and this has affected her weight and sleep.  Insulin Resistance Jill Harper has a diagnosis of insulin resistance. She is stable on metformin and diet. She has been on prednisone recently, which worsens insulin resistance, but she is finished now.   ASSESSMENT AND PLAN:  Insulin resistance  Class 2 severe obesity with serious comorbidity and body mass index (BMI) of 38.0 to 38.9 in adult, unspecified obesity type ([place])  PLAN:  Insulin Resistance Jill Harper will continue metformin and diet and we will recheck labs at the next in-office visit. She will continue to work on weight loss, exercise, and decreasing simple carbohydrates to help decrease the risk of diabetes. Jill Harper agreed to follow-up with [name] as directed to closely monitor her progress.  Time 3 We spent > than 50% of the 12 minute visit on the counseling and coordination of care documented above.  Obesity Jill Harper is currently in the action stage of change. As such, her goal is to continue with weight loss efforts. She has agreed to follow the Category 2 Plan. Jill Harper has  been instructed to work up to a goal of 150 minutes of combined cardio and strengthening exercise per week for weight loss and overall health benefits. We discussed the following Behavioral Modification Strategies today: holiday eating strategies .  Jill Harper has agreed to follow-up with our clinic in 3 to 4 weeks. She was informed of the importance of frequent follow-up visits to maximize her success with intensive lifestyle modifications for her multiple health conditions.  ALLERGIES: Allergies  Allergen Reactions  . Sulfonamide Derivatives     REACTION: hives    MEDICATIONS: Current Outpatient Medications on File Prior to Visit  Medication Sig Dispense Refill  . albuterol (PROVENTIL HFA) 108 (90 Base) MCG/ACT inhaler Inhale 2 puffs into the lungs 4 (four) times daily as needed. Reported on 09/03/2015 1 Inhaler 4  . albuterol (PROVENTIL) (2.5 MG/3ML) 0.083% nebulizer solution Take 3 mLs (2.5 mg total) by nebulization every 6 (six) hours as needed for wheezing or shortness of breath. 120 vial 5  . aspirin EC 81 MG tablet Take 81 mg by mouth daily.    Jill Harper atenolol (TENORMIN) 25 MG tablet Take 0.5 tablets (12.5 mg total) by mouth daily as needed (mitral prolapse). 45 tablet 0  . azaTHIOprine (IMURAN) 50 MG tablet TAKE 2 TABLETS(100 MG) BY MOUTH DAILY 60 tablet 0  . BREO ELLIPTA 100-25 MCG/INH AEPB INHALE 1 PUFF INTO THE LUNGS DAILY 60 each 5  . eletriptan (RELPAX) 20 MG tablet One tablet by mouth at onset of headache. May repeat in 2  hours if headache persists or recurs. 10 tablet 0  . folic acid (FOLVITE) 1 MG tablet TAKE 2 TABLETS(2 MG) BY MOUTH DAILY 180 tablet 3  . Fremanezumab-vfrm (AJOVY) 225 MG/1.5ML SOAJ Inject 225 mg into the skin every 30 (thirty) days. 1 pen 11  . guaiFENesin (MUCINEX) 600 MG 12 hr tablet Take 2 tablets (1,200 mg total) by mouth 2 (two) times daily as needed for cough or to loosen phlegm. (Patient taking differently: Take 1,200 mg by mouth 2 (two) times daily. )    .  metFORMIN (GLUCOPHAGE) 500 MG tablet Take 1 tablet (500 mg total) by mouth 2 (two) times daily with a meal. 60 tablet 0  . Multiple Vitamins-Minerals (MULTIVITAMIN GUMMIES ADULT PO) Take 2 each by mouth daily.    Jill Harper PREDNISONE, PAK, PO Take by mouth. Take as directed    . Respiratory Therapy Supplies (FLUTTER) DEVI Use as directed (Patient taking differently: 2 (two) times daily. Use as directed) 1 each 0  . telmisartan (MICARDIS) 20 MG tablet TAKE 1 TABLET(20 MG) BY MOUTH DAILY 90 tablet 1   No current facility-administered medications on file prior to visit.    PAST MEDICAL HISTORY: Past Medical History:  Diagnosis Date  . Abnormal liver function test   . Bronchiectasis       . Dyspnea   . Fibromuscular dysplasia ([place])   . Fibromyalgia   . Heart murmur   . ILD (interstitial lung disease) ([place])   . Joint pain   . Menorrhagia   . MIGRAINE HEADACHE   . MVP (mitral valve prolapse)   . Osteoarthritis   . Polymyositis ([place])    Dr [name]; chronic MTX  . Pulmonary fibrosis ([place])   . Rheumatoid arthritis ([place])   . Sleep apnea   . Vitamin B 12 deficiency   . Vitamin D deficiency     PAST SURGICAL HISTORY: Past Surgical History:  Procedure Laterality Date  . ablation uterine  2010  . CARDIAC CATHETERIZATION  2002   normal  . DILATION AND CURETTAGE OF UTERUS  08-31-08   Dr [name]  . IR ANGIO INTRA EXTRACRAN SEL COM CAROTID INNOMINATE BILAT MOD SED  09/18/2018  . IR ANGIO VERTEBRAL SEL VERTEBRAL BILAT MOD SED  09/18/2018  . IR US GUIDE VASC ACCESS RIGHT  09/18/2018    SOCIAL HISTORY: Social History   Tobacco Use  . Smoking status: Never Smoker  . Smokeless tobacco: Never Used  . Tobacco comment: Married, lives with spouse. 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 Alcohol use: Never    Comment: maybe a wine cooler once every other year  . Drug use: Never    FAMILY HISTORY: Family History  Problem  Relation Age of Onset  . Diabetes Mother   . Fibromyalgia Mother   . Ulcers Mother   . Heart failure Mother   . Thyroid disease Mother   . Obesity Mother   . Multiple myeloma Father   . Hypertension Father   . Stroke Father   . Lupus Sister   . Other Sister        abdominal adhesions resulting in bowel obstruction  . Migraines Sister   . Heart disease Brother        bypass surgery  . Rheum arthritis Sister   . Multiple myeloma Sister   . Hypertension Sister   . Heart attack Sister   . Diabetes Sister   . Hypertension  Sister   . Rheum arthritis Sister   . Diabetes Sister   . Diabetes Brother   . Headache Other        siblings with headaches but not diagnosed as migraines    ROS: Review of Systems  Constitutional: Negative for weight loss.    PHYSICAL EXAM: There were no vitals taken for this visit. There is no height or weight on file to calculate BMI. Physical Exam Vitals reviewed.  Constitutional:      General: She is not in acute distress.    Appearance: Normal appearance. She is well-developed. She is obese.  Cardiovascular:     Rate and Rhythm: Normal rate.  Pulmonary:     Effort: Pulmonary effort is normal.  Musculoskeletal:        General: Normal range of motion.  Skin:    General: Skin is warm and dry.  Neurological:     Mental Status: She is alert and oriented to person, place, and time.  Psychiatric:        Mood and Affect: Mood normal.        Behavior: Behavior normal.     RECENT LABS AND TESTS: BMET    Component Value Date/Time   NA 141 07/23/2019 1122   K 4.8 07/23/2019 1122   CL 104 07/23/2019 1122   CO2 22 07/23/2019 1122   GLUCOSE 90 07/23/2019 1122   GLUCOSE 82 04/06/2019 1551   BUN 12 07/23/2019 1122   CREATININE 0.53 (L) 07/23/2019 1122   CREATININE 0.69 04/06/2019 1551   CALCIUM 9.9 07/23/2019 1122   GFRNONAA 109 07/23/2019 1122   GFRNONAA 99 04/06/2019 1551   GFRAA 125 07/23/2019 1122   GFRAA 115 04/06/2019 1551   Lab  Results  Component Value Date   HGBA1C 5.5 10/09/2018   HGBA1C 4.8 04/24/2016   Lab Results  Component Value Date   INSULIN 22.4 10/09/2018   CBC    Component Value Date/Time   WBC 6.8 07/23/2019 1122   WBC 6.7 04/06/2019 1551   RBC 4.96 07/23/2019 1122   RBC 5.06 04/06/2019 1551   HGB 14.3 07/23/2019 1122   HCT 43.8 07/23/2019 1122   PLT 292 07/23/2019 1122   MCV 88 07/23/2019 1122   MCH 28.8 07/23/2019 1122   MCH 28.1 04/06/2019 1551   MCHC 32.6 07/23/2019 1122   MCHC 33.0 04/06/2019 1551   RDW 13.5 07/23/2019 1122   LYMPHSABS 1.2 07/23/2019 1122   MONOABS 666 01/31/2017 1006   EOSABS 0.1 07/23/2019 1122   BASOSABS 0.0 07/23/2019 1122   Iron/TIBC/Ferritin/ %Sat No results found for: IRON, TIBC, FERRITIN, IRONPCTSAT Lipid Panel     Component Value Date/Time   CHOL 141 10/09/2018 1300   TRIG 116 10/09/2018 1300   HDL 44 10/09/2018 1300   CHOLHDL 3 04/24/2016 0900   VLDL 11.8 04/24/2016 0900   LDLCALC 74 10/09/2018 1300   Hepatic Function Panel     Component Value Date/Time   PROT 7.8 07/23/2019 1122   ALBUMIN 4.6 07/23/2019 1122   AST 23 07/23/2019 1122   ALT 17 07/23/2019 1122   ALKPHOS 87 07/23/2019 1122   BILITOT 0.5 07/23/2019 1122   BILIDIR 0.1 04/03/2018 1457   IBILI 0.3 04/03/2018 1457      Component Value Date/Time   TSH 3.24 10/01/2018 1452   TSH 5.28 (H) 03/18/2018 1642   TSH 4.82 (H) 02/18/2018 1609    Ref. Range 01/01/2019 15:35  Vitamin D, 25-Hydroxy Latest Ref Range: 30 - 100 ng/mL 45  I, Doreene Nest, am acting as transcriptionist for Dennard Nip, MD I have reviewed the above documentation for accuracy and completeness, and I agree with the above. -Dennard Nip, MD

## 2019-08-11 ENCOUNTER — Ambulatory Visit: Payer: Federal, State, Local not specified - PPO | Attending: Internal Medicine

## 2019-08-11 DIAGNOSIS — Z20822 Contact with and (suspected) exposure to covid-19: Secondary | ICD-10-CM | POA: Diagnosis not present

## 2019-08-12 ENCOUNTER — Other Ambulatory Visit: Payer: Self-pay | Admitting: *Deleted

## 2019-08-12 MED ORDER — AZATHIOPRINE 50 MG PO TABS: ORAL_TABLET | ORAL | 0 refills | Status: DC

## 2019-08-13 LAB — NOVEL CORONAVIRUS, NAA: SARS-CoV-2, NAA: NOT DETECTED

## 2019-08-13 NOTE — Progress Notes (Signed)
Virtual Visit via Telephone Note  I connected with Jill Harper on 08/20/19 at 11:00 AM EST by telephone and verified that I am speaking with the correct person using two identifiers.  Location: Patient: Home Provider: Clinic  This service was conducted via virtual visit.  The patient was located at home. I was located in my office.  Consent was obtained prior to the virtual visit and is aware of possible charges through their insurance for this visit.  The patient is an established patient.  Dr. Estanislado Pandy, MD conducted the virtual visit and Hazel Sams, PA-C acted as scribe during the service.  Office staff helped with scheduling follow up visits after the service was conducted.     I discussed the limitations, risks, security and privacy concerns of performing an evaluation and management service by telephone and the availability of in person appointments. I also discussed with the patient that there may be a patient responsible charge related to this service. The patient expressed understanding and agreed to proceed.  CC: Upper extremity muscle weakness  History of Present Illness: Jill Harper is a 54 y.o. female with history of polymyositis, seropositive rheumatoid arthritis, fibromyalgia, and osteoarthritis.  She is taking Imuran 50 mg 2 tablets po daily. She has ongoing pain and stiffness in both hands. She is experiencing muscle tenderness and weakness in both upper extremities.  She had evidence of  bilateral moderate to severe carpal tunnel syndrome on NCV with EMG performed by Dr. Jaynee Eagles.  She was referred to Dr. Fredna Dow. According to the patient, he feels it is due to a vascular concern. She had a recent follow up with [name] recommended continuing on the current regimen.  She is on 2L of oxygen at night with CPAP.      Review of Systems  Constitutional: Positive for malaise/fatigue. Negative for fever.  Eyes: Negative for photophobia, pain, discharge and redness.  Respiratory:  Positive for cough and shortness of breath. Negative for wheezing.   Cardiovascular: Negative for chest pain, palpitations and leg swelling.  Gastrointestinal: Negative for blood in stool, constipation and diarrhea.  Genitourinary: Negative for dysuria and urgency.  Musculoskeletal: Positive for joint pain. Negative for back pain, myalgias and neck pain.       +Morning stiffness   Skin: Positive for rash.  Neurological: Positive for weakness. Negative for dizziness and headaches.  Endo/Heme/Allergies: Does not bruise/bleed easily.  Psychiatric/Behavioral: Negative for depression. The patient is not nervous/anxious and does not have insomnia.      Observations/Objective:  Physical Exam  Constitutional: She is oriented to person, place, and time.  Neurological: She is alert and oriented to person, place, and time.  Psychiatric: Mood, memory, affect and judgment normal.     Patient reports morning stiffness for 5 minutes.   Patient reports nocturnal pain.  Difficulty dressing/grooming: Reports Difficulty climbing stairs: Denies Difficulty getting out of chair: Reports Difficulty using hands for taps, buttons, cutlery, and/or writing: Reports  Assessment and Plan: Visit Diagnoses: Rheumatoid arthritis involving multiple sites with positive rheumatoid factor (HCC) -  +RF, +CCP: She has not had any recent rheumatoid arthritis flares.  She is clinically doing well on Imuran 100 mg po daily. She is currently experiencing pain and joint stiffness in both hands.  No obvious inflammation. We will order ESR and CCP for further evaluation. She will continue taking Imuran as prescribed.  She was advised to notify us if she develops increased joint pain or joint swelling. She will follow up in  3 months.    Polymyositis ([place]) - Dx by Dr. Charlestine Night 2007, CK 801, Jo-1 positive: She reports increased bilateral upper extremity muscle weakness and muscle tenderness.  She is having difficulty lifting objects  due to the discomfort.  We will check a ESR and CK.    High risk medication use  - Imuran 100 mg po daily.  Discontinue methotrexate due to further progression of ILD. CBC and CMP were drawn on 07/23/19.  Standing orders are in place.    Jo-1 antibody positive  ILD (interstitial lung disease) ([place]) -She was evaluated by [name] on 07/17/19 and according to his note he recommended continuing on Imuran, breo, and albuterol prn as prescribed.  For management of OSA, she continues to be on 2L of oxygen at night with CPAP. She is not experiencing any new or worsening pulmonary symptoms at this time.   Bronchiectasis without complication ([place]): She will continue on Breo and albuterol as prescribed. She has not had any recent exacerbations.   Primary osteoarthritis of both hands: She is experiencing pain and stiffness in both hands.  She does not have any inflammation at this time.  Joint protection and muscle strengthening were discussed.   Bilateral carpal tunnel syndrome: She had evidence of moderate to severe carpal tunnel syndrome bilaterally on NCV with EMG performed by Dr. Jaynee Eagles on 07/21/19. She was referred to Dr. Fredna Dow.  Primary osteoarthritis of both feet: She has no feet pain or inflammation at this time.   Fibromyalgia: She continues to have generalized muscle aches and muscle tenderness due to fibromyalgia, especially in bilateral upper extremities.    Other medical conditions are listed as follows:   Erythema nodosum  History of migraine  Vitamin D deficiency  Fibromuscular dysplasia ([place])  History of mitral valve prolapse  Other fatigue  Follow Up Instructions: She will follow up 3 months.    I discussed the assessment and treatment plan with the patient. The patient was provided an opportunity to ask questions and all were answered. The patient agreed with the plan and demonstrated an understanding of the instructions.   The patient was advised to call back  or seek an in-person evaluation if the symptoms worsen or if the condition fails to improve as anticipated.  I provided 25 minutes of non-face-to-face time during this encounter.  Bo Merino, MD   Scribed byLovena Le Dale,PA-C

## 2019-08-20 ENCOUNTER — Encounter: Payer: Self-pay | Admitting: Rheumatology

## 2019-08-20 ENCOUNTER — Other Ambulatory Visit: Payer: Self-pay

## 2019-08-20 ENCOUNTER — Telehealth (INDEPENDENT_AMBULATORY_CARE_PROVIDER_SITE_OTHER): Payer: Federal, State, Local not specified - PPO | Admitting: Rheumatology

## 2019-08-20 DIAGNOSIS — L52 Erythema nodosum: Secondary | ICD-10-CM

## 2019-08-20 DIAGNOSIS — M0579 Rheumatoid arthritis with rheumatoid factor of multiple sites without organ or systems involvement: Secondary | ICD-10-CM

## 2019-08-20 DIAGNOSIS — Z79899 Other long term (current) drug therapy: Secondary | ICD-10-CM | POA: Diagnosis not present

## 2019-08-20 DIAGNOSIS — R5383 Other fatigue: Secondary | ICD-10-CM

## 2019-08-20 DIAGNOSIS — M19041 Primary osteoarthritis, right hand: Secondary | ICD-10-CM

## 2019-08-20 DIAGNOSIS — M3320 Polymyositis, organ involvement unspecified: Secondary | ICD-10-CM | POA: Diagnosis not present

## 2019-08-20 DIAGNOSIS — R768 Other specified abnormal immunological findings in serum: Secondary | ICD-10-CM

## 2019-08-20 DIAGNOSIS — M19071 Primary osteoarthritis, right ankle and foot: Secondary | ICD-10-CM

## 2019-08-20 DIAGNOSIS — M19072 Primary osteoarthritis, left ankle and foot: Secondary | ICD-10-CM

## 2019-08-20 DIAGNOSIS — J479 Bronchiectasis, uncomplicated: Secondary | ICD-10-CM

## 2019-08-20 DIAGNOSIS — I773 Arterial fibromuscular dysplasia: Secondary | ICD-10-CM

## 2019-08-20 DIAGNOSIS — E559 Vitamin D deficiency, unspecified: Secondary | ICD-10-CM

## 2019-08-20 DIAGNOSIS — J849 Interstitial pulmonary disease, unspecified: Secondary | ICD-10-CM

## 2019-08-20 DIAGNOSIS — G5603 Carpal tunnel syndrome, bilateral upper limbs: Secondary | ICD-10-CM

## 2019-08-20 DIAGNOSIS — M19042 Primary osteoarthritis, left hand: Secondary | ICD-10-CM

## 2019-08-20 DIAGNOSIS — Z8669 Personal history of other diseases of the nervous system and sense organs: Secondary | ICD-10-CM

## 2019-08-20 DIAGNOSIS — M797 Fibromyalgia: Secondary | ICD-10-CM

## 2019-08-20 DIAGNOSIS — Z8679 Personal history of other diseases of the circulatory system: Secondary | ICD-10-CM

## 2019-08-24 ENCOUNTER — Other Ambulatory Visit: Payer: Self-pay

## 2019-08-24 ENCOUNTER — Telehealth (INDEPENDENT_AMBULATORY_CARE_PROVIDER_SITE_OTHER): Payer: Federal, State, Local not specified - PPO | Admitting: Family Medicine

## 2019-08-24 ENCOUNTER — Other Ambulatory Visit (INDEPENDENT_AMBULATORY_CARE_PROVIDER_SITE_OTHER): Payer: Self-pay | Admitting: Family Medicine

## 2019-08-24 ENCOUNTER — Encounter (INDEPENDENT_AMBULATORY_CARE_PROVIDER_SITE_OTHER): Payer: Self-pay | Admitting: Family Medicine

## 2019-08-24 DIAGNOSIS — Z9189 Other specified personal risk factors, not elsewhere classified: Secondary | ICD-10-CM

## 2019-08-24 DIAGNOSIS — Z6841 Body Mass Index (BMI) 40.0 and over, adult: Secondary | ICD-10-CM | POA: Diagnosis not present

## 2019-08-24 DIAGNOSIS — E8881 Metabolic syndrome: Secondary | ICD-10-CM

## 2019-08-24 MED ORDER — METFORMIN HCL 500 MG PO TABS: 500.0000 mg | ORAL_TABLET | Freq: Two times a day (BID) | ORAL | 0 refills | Status: DC

## 2019-08-25 ENCOUNTER — Other Ambulatory Visit: Payer: Self-pay

## 2019-08-25 DIAGNOSIS — M0579 Rheumatoid arthritis with rheumatoid factor of multiple sites without organ or systems involvement: Secondary | ICD-10-CM | POA: Diagnosis not present

## 2019-08-25 DIAGNOSIS — M3320 Polymyositis, organ involvement unspecified: Secondary | ICD-10-CM

## 2019-08-25 DIAGNOSIS — Z79899 Other long term (current) drug therapy: Secondary | ICD-10-CM

## 2019-08-25 NOTE — Progress Notes (Signed)
TeleHealth Visit:  Due to the COVID-19 pandemic, this visit was completed with telemedicine (audio/video) technology to reduce patient and provider exposure as well as to preserve personal protective equipment.   Jill Harper has verbally consented to this TeleHealth visit. The patient is located at home, the provider is located at the [facility] office. The participants in this visit include the listed provider and patient. Jill Harper was unable to use realtime audiovisual technology today and the telehealth visit was conducted via telephone.  Chief Complaint: OBESITY Jill Harper is here to discuss her progress with her obesity treatment plan along with follow-up of her obesity related diagnoses. Jill Harper is on the Category 2 Plan and states she is following her eating plan approximately 97% of the time. Jill Harper states she is doing 0 minutes 0 times per week.  Today's visit was #: 19 Starting weight: 273 lbs Starting date: 10/09/2018  Interim History: Jill Harper has been trying to follow her Category 2 plan, but she hasn't felt well recently. She thinks she might have gained weight, but she isn't sure. Her appointment was changed to telehealth due to her feeling poorly.  Subjective:   1. Insulin resistance Jill Harper has been tolerating metformin well and she requests a refill today. She is working on diet and her last A1c was stable at 5.5.  2. At increased risk of exposure to COVID-19 virus Jill Harper has had worsening cough which she has contributed to her underlying health issues. Her husband was exposed to [place], and they both tested negative approximately 2 weeks ago, but she was still coughing. She notes her fingernails have turned more purple, and she has a low reading on her home pulse ox in the 70's but her Rheumatologist, Dr. Estanislado Pandy thought it was vascular. She had an appointment with vascular that she had to cancel.  Assessment/Plan:   1. Insulin resistance Jill Harper will continue to work on weight  loss, exercise, and decreasing simple carbohydrates to help decrease the risk of diabetes. We will refill metformin for 1 month. Jill Harper agreed to follow-up with [name] as directed to closely monitor her progress.  - metFORMIN (GLUCOPHAGE) 500 MG tablet; Take 1 tablet (500 mg total) by mouth 2 (two) times daily with a meal.  Dispense: 60 tablet; Refill: 0  2. At increased risk of exposure to COVID-19 virus Jill Harper was advised to reschedule her vascular appointment and retest for [place], and continue to monitor her symptoms. We will follow closely, but she knows to go to the emergency department if her symptoms worsens, especially shortness of breath.  3. Class 3 severe obesity with serious comorbidity and body mass index (BMI) of 40.0 to 44.9 in adult, unspecified obesity type (HCC) Jill Harper is currently in the action stage of change. As such, her goal is to continue with weight loss efforts. She has agreed to the Category 2 Plan.   Jill Harper is to concentrate on feeling better and we will get back to weight loss efforts when she does.  Behavioral modification strategies: increasing water intake.  Jill Harper has agreed to follow-up with our clinic in 3 weeks. She was informed of the importance of frequent follow-up visits to maximize her success with intensive lifestyle modifications for her multiple health conditions.  Objective:   VITALS: Per patient if applicable, see vitals. GENERAL: Alert and in no acute distress. CARDIOPULMONARY: No increased WOB. Speaking in clear sentences.  PSYCH: Pleasant and cooperative. Speech normal rate and rhythm. Affect is appropriate. Insight and judgement are appropriate. Attention is  focused, linear, and appropriate.  NEURO: Oriented as arrived to appointment on time with no prompting.   Lab Results  Component Value Date   CREATININE 0.53 (L) 07/23/2019   BUN 12 07/23/2019   NA 141 07/23/2019   K 4.8 07/23/2019   CL 104 07/23/2019   CO2 22 07/23/2019   Lab Results    Component Value Date   ALT 17 07/23/2019   AST 23 07/23/2019   ALKPHOS 87 07/23/2019   BILITOT 0.5 07/23/2019   Lab Results  Component Value Date   HGBA1C 5.5 10/09/2018   HGBA1C 4.8 04/24/2016   Lab Results  Component Value Date   INSULIN 22.4 10/09/2018   Lab Results  Component Value Date   TSH 3.24 10/01/2018   Lab Results  Component Value Date   CHOL 141 10/09/2018   HDL 44 10/09/2018   LDLCALC 74 10/09/2018   TRIG 116 10/09/2018   CHOLHDL 3 04/24/2016   Lab Results  Component Value Date   WBC 6.8 07/23/2019   HGB 14.3 07/23/2019   HCT 43.8 07/23/2019   MCV 88 07/23/2019   PLT 292 07/23/2019   No results found for: IRON, TIBC, FERRITIN  Attestation Statements:   Reviewed by clinician on day of visit: allergies, medications, problem list, medical history, surgical history, family history, social history, and previous encounter notes.  Time spent on visit including pre-visit chart review and post-visit care was 30 minutes.   I, Trixie Dredge, am acting as transcriptionist for Dennard Nip, MD.  I have reviewed the above documentation for accuracy and completeness, and I agree with the above. - Dennard Nip, MD

## 2019-08-26 LAB — COMPLETE METABOLIC PANEL WITH GFR
AG Ratio: 1.4 (calc) (ref 1.0–2.5)
ALT: 16 U/L (ref 6–29)
AST: 17 U/L (ref 10–35)
Albumin: 4.1 g/dL (ref 3.6–5.1)
Alkaline phosphatase (APISO): 61 U/L (ref 37–153)
BUN: 15 mg/dL (ref 7–25)
CO2: 25 mmol/L (ref 20–32)
Calcium: 9.6 mg/dL (ref 8.6–10.4)
Chloride: 108 mmol/L (ref 98–110)
Creat: 0.78 mg/dL (ref 0.50–1.05)
GFR, Est African American: 101 mL/min/{1.73_m2} (ref >=60)
GFR, Est Non African American: 87 mL/min/{1.73_m2} (ref >=60)
Globulin: 2.9 g/dL (calc) (ref 1.9–3.7)
Glucose, Bld: 91 mg/dL (ref 65–99)
Potassium: 4.4 mmol/L (ref 3.5–5.3)
Sodium: 139 mmol/L (ref 135–146)
Total Bilirubin: 0.3 mg/dL (ref 0.2–1.2)
Total Protein: 7 g/dL (ref 6.1–8.1)

## 2019-08-26 LAB — CBC WITH DIFFERENTIAL/PLATELET
Absolute Monocytes: 759 cells/uL (ref 200–950)
Basophils Absolute: 22 cells/uL (ref 0–200)
Basophils Relative: 0.3 %
Eosinophils Absolute: 117 cells/uL (ref 15–500)
Eosinophils Relative: 1.6 %
HCT: 39.4 % (ref 35.0–45.0)
Hemoglobin: 13 g/dL (ref 11.7–15.5)
Lymphs Abs: 1095 cells/uL (ref 850–3900)
MCH: 28.4 pg (ref 27.0–33.0)
MCHC: 33 g/dL (ref 32.0–36.0)
MCV: 86.2 fL (ref 80.0–100.0)
MPV: 10.6 fL (ref 7.5–12.5)
Monocytes Relative: 10.4 %
Neutro Abs: 5307 cells/uL (ref 1500–7800)
Neutrophils Relative %: 72.7 %
Platelets: 287 10*3/uL (ref 140–400)
RBC: 4.57 10*6/uL (ref 3.80–5.10)
RDW: 12.8 % (ref 11.0–15.0)
Total Lymphocyte: 15 %
WBC: 7.3 10*3/uL (ref 3.8–10.8)

## 2019-08-26 LAB — SEDIMENTATION RATE: Sed Rate: 33 mm/h — ABNORMAL HIGH (ref 0–30)

## 2019-08-26 LAB — CK: Total CK: 42 U/L (ref 29–143)

## 2019-08-26 NOTE — Progress Notes (Signed)
Labs are stable.

## 2019-08-27 ENCOUNTER — Other Ambulatory Visit: Payer: Self-pay

## 2019-08-27 DIAGNOSIS — M79642 Pain in left hand: Secondary | ICD-10-CM

## 2019-08-27 DIAGNOSIS — M79641 Pain in right hand: Secondary | ICD-10-CM

## 2019-08-28 ENCOUNTER — Ambulatory Visit: Payer: Federal, State, Local not specified - PPO | Admitting: Vascular Surgery

## 2019-08-28 ENCOUNTER — Other Ambulatory Visit: Payer: Self-pay

## 2019-08-28 ENCOUNTER — Ambulatory Visit (HOSPITAL_COMMUNITY)
Admission: RE | Admit: 2019-08-28 | Discharge: 2019-08-28 | Disposition: A | Discharge status: Home / Self Care | Payer: Federal, State, Local not specified - PPO | Source: Ambulatory Visit | Attending: Vascular Surgery | Admitting: Vascular Surgery

## 2019-08-28 ENCOUNTER — Encounter: Payer: Self-pay | Admitting: Vascular Surgery

## 2019-08-28 VITALS — BP 125/74 | HR 83 | Temp 97.2°F | Resp 20 | Ht 68.0 in | Wt 259.0 lb

## 2019-08-28 DIAGNOSIS — M79642 Pain in left hand: Secondary | ICD-10-CM | POA: Diagnosis not present

## 2019-08-28 DIAGNOSIS — I7300 Raynaud's syndrome without gangrene: Secondary | ICD-10-CM | POA: Diagnosis not present

## 2019-08-28 DIAGNOSIS — M79641 Pain in right hand: Secondary | ICD-10-CM | POA: Diagnosis not present

## 2019-08-28 NOTE — Progress Notes (Signed)
Patient ID: Jill Harper, female   DOB: [birth year], 54 y.o.   MRN: [id number]  Reason for Consult: New Patient (Initial Visit)   Referred by Binnie Rail, MD  Subjective:     HPI:  Jill Harper is a 54 y.o. female with known fibromuscular dysplasia as well as rheumatoid arthritis.  In the past she has undergone cerebrovascular angiography for fibromuscular dysplasia.  For that she does take aspirin daily.  More recently she has a several week history of blue discoloration of her fingers.  She has been seen by neurology for pain in her hands was referred to hand surgery with abnormal neurologic test.  She has not had any tissue loss or ulceration.  She does have some dark discoloration of her toes that she relates to methotrexate is not had similar issues but does have some numbness occasionally of her feet.  Past Medical History:  Diagnosis Date  . Abnormal liver function test   . Bronchiectasis       . Dyspnea   . Fibromuscular dysplasia ([place])   . Fibromyalgia   . Heart murmur   . ILD (interstitial lung disease) ([place])   . Joint pain   . Menorrhagia   . MIGRAINE HEADACHE   . MVP (mitral valve prolapse)   . Osteoarthritis   . Polymyositis ([place])    Dr [name]; chronic MTX  . Pulmonary fibrosis ([place])   . Rheumatoid arthritis ([place])   . Sleep apnea   . Vitamin B 12 deficiency   . Vitamin D deficiency    Family History  Problem Relation Age of Onset  . Diabetes Mother   . Fibromyalgia Mother   . Ulcers Mother   . Heart failure Mother   . Thyroid disease Mother   . Obesity Mother   . Multiple myeloma Father   . Hypertension Father   . Stroke Father   . Lupus Sister   . Other Sister        abdominal adhesions resulting in bowel obstruction  . Migraines Sister   . Heart disease Brother        bypass surgery  . Rheum arthritis Sister   . Multiple myeloma Sister   . Hypertension Sister   . Heart attack Sister   . Diabetes Sister   . Hypertension Sister   .  Rheum arthritis Sister   . Diabetes Sister   . Diabetes Brother   . Headache Other        siblings with headaches but not diagnosed as migraines   Past Surgical History:  Procedure Laterality Date  . ablation uterine  2010  . CARDIAC CATHETERIZATION  2002   normal  . DILATION AND CURETTAGE OF UTERUS  08-31-08   Dr [name]  . IR ANGIO INTRA EXTRACRAN SEL COM CAROTID INNOMINATE BILAT MOD SED  09/18/2018  . IR ANGIO VERTEBRAL SEL VERTEBRAL BILAT MOD SED  09/18/2018  . IR US GUIDE VASC ACCESS RIGHT  09/18/2018    Short Social History:  Social History   Tobacco Use  . Smoking status: Never Smoker  . Smokeless tobacco: Never Used  . Tobacco comment: Married, lives with spouse. 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 Alcohol use: Not Currently    Comment: maybe a wine cooler once every other year    Allergies  Allergen Reactions  . Sulfonamide Derivatives     REACTION: hives    Current  Outpatient Medications  Medication Sig Dispense Refill  . albuterol (PROVENTIL HFA) 108 (90 Base) MCG/ACT inhaler Inhale 2 puffs into the lungs 4 (four) times daily as needed. Reported on 09/03/2015 1 Inhaler 4  . albuterol (PROVENTIL) (2.5 MG/3ML) 0.083% nebulizer solution Take 3 mLs (2.5 mg total) by nebulization every 6 (six) hours as needed for wheezing or shortness of breath. 120 vial 5  . aspirin EC 81 MG tablet Take 81 mg by mouth daily.    [name] atenolol (TENORMIN) 25 MG tablet Take 0.5 tablets (12.5 mg total) by mouth daily as needed (mitral prolapse). 45 tablet 0  . azaTHIOprine (IMURAN) 50 MG tablet TAKE 2 TABLETS(100 MG) BY MOUTH DAILY 60 tablet 0  . BREO ELLIPTA 100-25 MCG/INH AEPB INHALE 1 PUFF INTO THE LUNGS DAILY 60 each 5  . eletriptan (RELPAX) 20 MG tablet One tablet by mouth at onset of headache. May repeat in 2 hours if headache persists or recurs. 10 tablet 0  . folic acid (FOLVITE) 1 MG tablet TAKE 2 TABLETS(2 MG) BY MOUTH  DAILY 180 tablet 3  . Fremanezumab-vfrm (AJOVY) 225 MG/1.5ML SOAJ Inject 225 mg into the skin every 30 (thirty) days. 1 pen 11  . guaiFENesin (MUCINEX) 600 MG 12 hr tablet Take 2 tablets (1,200 mg total) by mouth 2 (two) times daily as needed for cough or to loosen phlegm. (Patient taking differently: Take 1,200 mg by mouth 2 (two) times daily. )    . metFORMIN (GLUCOPHAGE) 500 MG tablet Take 1 tablet (500 mg total) by mouth 2 (two) times daily with a meal. 60 tablet 0  . Multiple Vitamins-Minerals (MULTIVITAMIN GUMMIES ADULT PO) Take 2 each by mouth daily.    [name] Respiratory Therapy Supplies (FLUTTER) DEVI Use as directed (Patient taking differently: 2 (two) times daily. Use as directed) 1 each 0  . telmisartan (MICARDIS) 20 MG tablet TAKE 1 TABLET(20 MG) BY MOUTH DAILY 90 tablet 1  . VITAMIN D PO Take by mouth.     No current facility-administered medications for this visit.    Review of Systems  Constitutional:  Constitutional negative. HENT: HENT negative.  Eyes: Eyes negative.  Respiratory: Respiratory negative.  Cardiovascular: Cardiovascular negative.  GI: Gastrointestinal negative.  Musculoskeletal:       Hand pain and discoloration as above Neurological: Neurological negative. Hematologic: Hematologic/lymphatic negative.  Psychiatric: Psychiatric negative.        Objective:  Objective   Vitals:   08/28/19 1432  BP: 125/74  Pulse: 83  Resp: 20  Temp: (!) 97.2 F (36.2 C)  SpO2: 97%  Weight: 259 lb (117.5 kg)  Height: 5' 8" (1.727 m)   Body mass index is 39.38 kg/m.  Physical Exam HENT:     Head: Normocephalic.     Nose:     Comments: Mask in place Neck:     Vascular: No carotid bruit.  Cardiovascular:     Pulses:          Carotid pulses are 2+ on the right side and 2+ on the left side.      Radial pulses are 2+ on the right side and 2+ on the left side.     Comments: Multiphasic palmar arch signal which is diminished with compression of the radial artery  bilaterally but present with compression of the ulnar arteries Pulmonary:     Effort: Pulmonary effort is normal.  Abdominal:     General: Abdomen is flat.     Palpations: Abdomen is soft. There is no  mass.  Musculoskeletal:        General: No swelling or deformity. Normal range of motion.  Skin:    General: Skin is warm and dry.     Capillary Refill: Capillary refill takes less than 2 seconds.  Neurological:     General: No focal deficit present.     Mental Status: She is alert.  Psychiatric:        Mood and Affect: Mood normal.        Behavior: Behavior normal.        Thought Content: Thought content normal.        Judgment: Judgment normal.     Data: CT IMPRESSION: 1. Unchanged beading of the cervical internal carotid arteries consistent with fibromuscular dysplasia. No evidence of significant stenosis or dissection. 2. Widely patent vertebral arteries. 3. No major intracranial arterial occlusion or significant stenosis.  I have independently interpreted her upper extremity noninvasive study which demonstrates triphasic waveforms bilaterally brachial, radial, ulnar arteries.  Digital pressure on the right 142 on the left 125.  I also reviewed her right upper extremity portion of her previous cerebrovascular angiogram.      Assessment/Plan:     54 year old female presents with blue discoloration of her fingers which is really new in onset.  She is taking aspirin daily.  Waveforms appear normal she appears to be radial artery dominant bilaterally with palpable radial artery pulses.  She has very strong palmar arches which are somewhat diminished with compression of her radial arteries.  She does not appear to have any central arterial issues from previous CT angio of the neck and her noninvasive studies today were essentially normal.  This is most likely Raynaud's phenomenon possibly related to her rheumatoid arthritis.  No vascular invention is warranted at this time.      Waynetta Sandy MD [facility]

## 2019-08-29 DIAGNOSIS — G4733 Obstructive sleep apnea (adult) (pediatric): Secondary | ICD-10-CM | POA: Diagnosis not present

## 2019-08-29 DIAGNOSIS — J471 Bronchiectasis with (acute) exacerbation: Secondary | ICD-10-CM | POA: Diagnosis not present

## 2019-09-01 ENCOUNTER — Other Ambulatory Visit: Payer: Self-pay | Admitting: Internal Medicine

## 2019-09-07 DIAGNOSIS — G5601 Carpal tunnel syndrome, right upper limb: Secondary | ICD-10-CM | POA: Diagnosis not present

## 2019-09-07 DIAGNOSIS — G4733 Obstructive sleep apnea (adult) (pediatric): Secondary | ICD-10-CM | POA: Diagnosis not present

## 2019-09-07 DIAGNOSIS — G5622 Lesion of ulnar nerve, left upper limb: Secondary | ICD-10-CM | POA: Diagnosis not present

## 2019-09-07 DIAGNOSIS — J471 Bronchiectasis with (acute) exacerbation: Secondary | ICD-10-CM | POA: Diagnosis not present

## 2019-09-08 ENCOUNTER — Telehealth: Payer: Federal, State, Local not specified - PPO | Admitting: Neurology

## 2019-09-09 DIAGNOSIS — G5622 Lesion of ulnar nerve, left upper limb: Secondary | ICD-10-CM | POA: Diagnosis not present

## 2019-09-09 DIAGNOSIS — G5601 Carpal tunnel syndrome, right upper limb: Secondary | ICD-10-CM | POA: Diagnosis not present

## 2019-09-11 ENCOUNTER — Other Ambulatory Visit: Payer: Self-pay | Admitting: *Deleted

## 2019-09-11 MED ORDER — AZATHIOPRINE 50 MG PO TABS: ORAL_TABLET | ORAL | 2 refills | Status: DC

## 2019-09-11 NOTE — Telephone Encounter (Signed)
Refill request received via fax  Last Visit: 08/20/19 Next Visit: 11/04/19 Labs: 08/25/19 Stable  Okay to refill per Dr. Estanislado Pandy

## 2019-09-14 ENCOUNTER — Other Ambulatory Visit: Payer: Self-pay

## 2019-09-14 ENCOUNTER — Encounter (INDEPENDENT_AMBULATORY_CARE_PROVIDER_SITE_OTHER): Payer: Self-pay | Admitting: Family Medicine

## 2019-09-14 ENCOUNTER — Ambulatory Visit (INDEPENDENT_AMBULATORY_CARE_PROVIDER_SITE_OTHER): Payer: Federal, State, Local not specified - PPO | Admitting: Family Medicine

## 2019-09-14 VITALS — BP 117/70 | HR 77 | Temp 98.0°F | Ht 68.0 in | Wt 258.0 lb

## 2019-09-14 DIAGNOSIS — Z6841 Body Mass Index (BMI) 40.0 and over, adult: Secondary | ICD-10-CM

## 2019-09-14 DIAGNOSIS — R7303 Prediabetes: Secondary | ICD-10-CM

## 2019-09-14 DIAGNOSIS — Z9189 Other specified personal risk factors, not elsewhere classified: Secondary | ICD-10-CM

## 2019-09-14 MED ORDER — METFORMIN HCL 500 MG PO TABS: 500.0000 mg | ORAL_TABLET | Freq: Two times a day (BID) | ORAL | 0 refills | Status: DC

## 2019-09-15 NOTE — Progress Notes (Signed)
Chief Complaint:   OBESITY Nakiya is here to discuss her progress with her obesity treatment plan along with follow-up of her obesity related diagnoses. Brynnli is on the Category 2 Plan and states she is following her eating plan approximately 95% of the time. Shola states she is doing 0 minutes 0 times per week.  Today's visit was #: 20 Starting weight: 273 lbs Starting date: 10/09/2018 Today's weight: 258 lbs Today's date: 09/14/2019 Total lbs lost to date: 15 Total lbs lost since last in-office visit: 14  Interim History: Audrea's last in office visit was 11 months ago before the pandemic. She has done well with weight loss during this time even with multiple health challenges.  Subjective:   1. Pre-diabetes Jamayra is stable on metformin and she has been on prednisone on and off (off now), and she suspects she has some hypoglycemia at times.  2. At risk for diabetes mellitus Mirriam is at higher than average risk for developing diabetes due to her obesity.   Assessment/Plan:   1. Pre-diabetes Kenidi will continue to work on weight loss, exercise, and decreasing simple carbohydrates to help decrease the risk of diabetes. We will refill metformin for 1 month, and she is to make sure to take it with protein rick meals.  - metFORMIN (GLUCOPHAGE) 500 MG tablet; Take 1 tablet (500 mg total) by mouth 2 (two) times daily with a meal.  Dispense: 60 tablet; Refill: 0  2. At risk for diabetes mellitus Sabrinna was given approximately 15 minutes of diabetes education and counseling today. We discussed intensive lifestyle modifications today with an emphasis on weight loss as well as increasing exercise and decreasing simple carbohydrates in her diet. We also reviewed medication options with an emphasis on risk versus benefit of those discussed.   Repetitive spaced learning was employed today to elicit superior memory formation and behavioral change.  3. Class 3 severe obesity with serious comorbidity and  body mass index (BMI) of 40.0 to 44.9 in adult, unspecified obesity type (HCC) Analyn is currently in the action stage of change. As such, her goal is to continue with weight loss efforts. She has agreed to the Category 2 Plan.   Behavioral modification strategies: increasing lean protein intake.  Kassadee has agreed to follow-up with our clinic in 3 to 4 weeks. She was informed of the importance of frequent follow-up visits to maximize her success with intensive lifestyle modifications for her multiple health conditions.   Objective:   Blood pressure 117/70, pulse 77, temperature 98 F (36.7 C), temperature source Oral, height 5\' 8"  (1.727 m), weight 258 lb (117 kg), SpO2 98 %. Body mass index is 39.23 kg/m.  General: Cooperative, alert, well developed, in no acute distress. HEENT: Conjunctivae and lids unremarkable. Cardiovascular: Regular rhythm.  Lungs: Normal work of breathing. Neurologic: No focal deficits.   Lab Results  Component Value Date   CREATININE 0.78 08/25/2019   BUN 15 08/25/2019   NA 139 08/25/2019   K 4.4 08/25/2019   CL 108 08/25/2019   CO2 25 08/25/2019   Lab Results  Component Value Date   ALT 16 08/25/2019   AST 17 08/25/2019   ALKPHOS 87 07/23/2019   BILITOT 0.3 08/25/2019   Lab Results  Component Value Date   HGBA1C 5.5 10/09/2018   HGBA1C 4.8 04/24/2016   Lab Results  Component Value Date   INSULIN 22.4 10/09/2018   Lab Results  Component Value Date   TSH 3.24 10/01/2018  Lab Results  Component Value Date   CHOL 141 10/09/2018   HDL 44 10/09/2018   LDLCALC 74 10/09/2018   TRIG 116 10/09/2018   CHOLHDL 3 04/24/2016   Lab Results  Component Value Date   WBC 7.3 08/25/2019   HGB 13.0 08/25/2019   HCT 39.4 08/25/2019   MCV 86.2 08/25/2019   PLT 287 08/25/2019   No results found for: IRON, TIBC, FERRITIN  Attestation Statements:   Reviewed by clinician on day of visit: allergies, medications, problem list, medical history, surgical  history, family history, social history, and previous encounter notes.   I, Trixie Dredge, am acting as transcriptionist for Dennard Nip, MD.  I have reviewed the above documentation for accuracy and completeness, and I agree with the above. -  Dennard Nip, MD

## 2019-09-16 DIAGNOSIS — Z0289 Encounter for other administrative examinations: Secondary | ICD-10-CM

## 2019-09-21 ENCOUNTER — Other Ambulatory Visit: Payer: Self-pay | Admitting: Acute Care

## 2019-09-22 NOTE — Progress Notes (Signed)
[facility]    Provider:  Dr Jill Harper Referring Provider: Estanislado Pandy, MD Primary Care Physician:  Jill Rail, MD  CC:  Headache,FMD, autoimmune disease  Interval history September 23, 2019: Patient here for follow-up, she was last seen in December for hand pain and EMG nerve conduction study did show carpal tunnel syndrome.  PMHx vitamin B12 deficiency, sleep apnea, rheumatoid arthritis, pulmonary fibrosis, polymyositis, osteoarthritis, migraines, joint pain, interstitial lung disease, fibromyalgia, fibromuscular dysplasia, bronchiectasis and abnormal liver function test. She saw Dr. Donzetta Harper and vein in vascular for discoloration of her skin, she gets blue tips of fingers and nails, diagnosed with raynaud's phenomenon related to her RA, no vascular intervention, reviewed his notes and discussed with patient as above. She saw Dr. Mervin Harper for the CTS and they are going to monitor, the symptoms have subsided and her symptoms were slight atypical for CTS on the right, she had ulnar neuropathy on the left and she noticed she did have symptoms and she was given prednisone which helped but it still some and goes, Jill Harper working for migraines not daily headaches, she can have an occassional headache and it generally subsides on its own occipital on the left feels hot.   Interval history 07/20/2019: Patient here for a new chief complaint as requested by Dr. Estanislado Harper for hand pain. PMHx vitamin B12 deficiency, sleep apnea, rheumatoid arthritis, pulmonary fibrosis, polymyositis, osteoarthritis, migraines, joint pain, interstitial lung disease, fibromyalgia, fibromuscular dysplasia, bronchiectasis and abnormal liver function test.  In August or September, she had hand pain. Her hands felt cool, right > left, she started getting cramping in the right hand, not significantly painful just cramped, difficult time straightening it, massage helped. A month later it happened again, more painful, and she could  see it "drawing", started getting colder to the touch, she woke up with excruciating pain in the hand and forearm ventrally and she was cramped again. She took 2 alleve. Severe, some numbness, pain felt like tightness, severe and visibly "drawing" up. Massaging helped finger by finger. MRI cervical spine was normal.  She has weakness in her hands. Progressively worsening. Pain in both shoulders. Some numbness in the first 4 digits. No prior diagnosis of CTS. No injury. She also felt a big knot on the inside of her wrist. Like a muscle spasm. No other focal neurologic deficits, associated symptoms, inciting events or modifiable factors.   Personally reviewed MRI cervical spine 07/08/2019: Normal  Cbc/cmp normal 03/2019  Interval history 06/08/2019: Carotid arteries on CTA c/w FMD. Discussed Fibromuscular Dysplasia, sent to Dr. Estanislado Harper and being followed there. She has sharp pain in the back of the head, stops her because it is so severe, so painful it stops her in her tracks. Leaves her weak afterwards. Can go weeks without it. It is random, unknown what triggers, brief, severe. Also continues to have migraines, left sided, pulsating, pounding, contsant pain, behind the eye, feels she may get ptosis, no lacrimation or rhinorrhea. She has light and sound sensitivity. She has daily headaches, no medication overuse, no aura. She has 8 migraine days a month with moderate or severe quality and can last 24-72 hours ongoing for > year at this frequency and severity and quality. For FMD and BO meds and a daily asa. Tried atenolol, relpax, telmisartan, verapamil, nortriptyline, topamax. She has some brain fog, discussed normal aging, chronic pain and lifestyle. She has had neck pain, she has numbness and tingling in the left hand and arm, ongoing for >  Months, failed  conservative measures, under the care of a physician.    HPI:  Jill Harper is a 54 y.o. female here as requested by Dr. Quay Harper for headaches. PMHx RA,  bronchiectasis, polymyositis on imuran follows with Rheumatology, never smoked, interstitial lung disease, fibromyalgia, migraines, mitral valve prolapse, abnormal liver function test. She was supposed to have a sleep study but did not complete that (per notes [name]).  She reports she has headaches that come on during exertion and dissipate without medication when she slows down or alters activities. These are different from her usual migraines. She just had sleep test a few weeks ago, snoring, stops breathing. She does not have the results. She wakes up at night feeling like she can't breathe. She wakes with with "new" headache in the setting of sleep problems for at least 5-6 months. She may have the same headache later in the day. The headache is a fogginedd across  the forehead and more on the left.She wakes with this headache in the setting if snoring and lung problems. The headache is worse with exertion, walking and talking, worse with valsalva and also with sex/orgasm. Rest helps and sitting still and it dissipates slowly. Can be moderately severe. Here with husband who provides much information.  Headache medications used: Atenolol, Flexeril, Relpax,  Reviewed notes, labs and imaging from outside physicians, which showed:  Personally reviewed images from 2013 and agree with the following   Reviewed [name] notes.  Last time she was seen was 07/28/2018.  Patient has a history of rheumatoid arthritis, polymyositis followed in rheumatology.  Also bronchiectasis.  She was supposed to have a sleep study that was previously ordered but she did not.  Epworth Sleepiness Scale 7 06/16/2018.  ANA negative, rheumatoid factor XX 9, anti-CCP 213, SCL 70, SSA SSB negative, Jo 1+.  Patient's last echocardiogram 05/29/2017 with ejection fraction 60 to 65%, moderate left ventricular hypertrophy, grade 1 DD.  CT of the sinuses were negative for acute disease.   Called Dr. Juanetta Gosling office and requested  results of sleep testing  Review of Systems: Patient complains of symptoms per HPI as well as the following symptoms: Headache, snoring, fatigue, shortness of breath, cough, wheezing, snoring, murmur, joint pain, joint swelling, cramps, aching muscles. Pertinent negatives and positives per HPI. All others negative.   Social History   Socioeconomic History  . Marital status: Married    Spouse name: Tyquisha Sharps  . Number of children: 1  . Years of education: Not on file  . Highest education level: High school graduate  Occupational History  . Occupation: Compliant and Injury Clerk  Tobacco Use  . Smoking status: Never Smoker  . Smokeless tobacco: Never Used  . Tobacco comment: Married, lives with spouse. 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 Alcohol use: Not Currently    Comment: maybe a wine cooler once every other year  . Drug use: Never  . Sexual activity: Yes    Birth control/protection: Surgical  Other Topics Concern  . Not on file  Social History Narrative   Exercise: trying to walk - limited by fatigue   Lives at home with husband    Right handed   Caffeine: occasional coke 1-2x per month   Social Determinants of Health   Financial Resource Strain:   . Difficulty of Paying Living Expenses: Not on file  Food Insecurity:   . Worried About Charity fundraiser in  the Last Year: Not on file  . Ran Out of Food in the Last Year: Not on file  Transportation Needs:   . Lack of Transportation (Medical): Not on file  . Lack of Transportation (Non-Medical): Not on file  Physical Activity:   . Days of Exercise per Week: Not on file  . Minutes of Exercise per Session: Not on file  Stress:   . Feeling of Stress : Not on file  Social Connections:   . Frequency of Communication with Friends and Family: Not on file  . Frequency of Social Gatherings with Friends and Family: Not on file  . Attends Religious Services:  Not on file  . Active Member of Clubs or Organizations: Not on file  . Attends Archivist Meetings: Not on file  . Marital Status: Not on file  Intimate Partner Violence:   . Fear of Current or Ex-Partner: Not on file  . Emotionally Abused: Not on file  . Physically Abused: Not on file  . Sexually Abused: Not on file    Family History  Problem Relation Age of Onset  . Diabetes Mother   . Fibromyalgia Mother   . Ulcers Mother   . Heart failure Mother   . Thyroid disease Mother   . Obesity Mother   . Multiple myeloma Father   . Hypertension Father   . Stroke Father   . Lupus Sister   . Other Sister        abdominal adhesions resulting in bowel obstruction  . Migraines Sister   . Heart disease Brother        bypass surgery  . Rheum arthritis Sister   . Multiple myeloma Sister   . Hypertension Sister   . Heart attack Sister   . Diabetes Sister   . Hypertension Sister   . Rheum arthritis Sister   . Diabetes Sister   . Diabetes Brother   . Headache Other        siblings with headaches but not diagnosed as migraines    Past Medical History:  Diagnosis Date  . Abnormal liver function test   . Bronchiectasis       . Dyspnea   . Fibromuscular dysplasia ([place])   . Fibromyalgia   . Heart murmur   . ILD (interstitial lung disease) ([place])   . Joint pain   . Menorrhagia   . MIGRAINE HEADACHE   . MVP (mitral valve prolapse)   . Osteoarthritis   . Polymyositis ([place])    Dr [name]; chronic MTX  . Pulmonary fibrosis ([place])   . Rheumatoid arthritis ([place])   . Sleep apnea   . Vitamin B 12 deficiency   . Vitamin D deficiency     Past Surgical History:  Procedure Laterality Date  . ablation uterine  2010  . CARDIAC CATHETERIZATION  2002   normal  . DILATION AND CURETTAGE OF UTERUS  08-31-08   Dr [name]  . IR ANGIO INTRA EXTRACRAN SEL COM CAROTID INNOMINATE BILAT MOD SED  09/18/2018  . IR ANGIO VERTEBRAL SEL VERTEBRAL BILAT MOD SED  09/18/2018  .  IR US GUIDE VASC ACCESS RIGHT  09/18/2018    Current Outpatient Medications  Medication Sig Dispense Refill  . albuterol (PROVENTIL HFA) 108 (90 Base) MCG/ACT inhaler Inhale 2 puffs into the lungs 4 (four) times daily as needed. Reported on 09/03/2015 1 Inhaler 4  . albuterol (PROVENTIL) (2.5 MG/3ML) 0.083% nebulizer solution Take 3 mLs (2.5 mg total) by nebulization every 6 (  six) hours as needed for wheezing or shortness of breath. 120 vial 5  . aspirin EC 81 MG tablet Take 81 mg by mouth daily.    [name] atenolol (TENORMIN) 25 MG tablet TAKE 1/2 TABLET BY MOUTH DAILY AS NEEDED FOR MITRAL PROLAPSE 45 tablet 0  . azaTHIOprine (IMURAN) 50 MG tablet TAKE 2 TABLETS(100 MG) BY MOUTH DAILY 60 tablet 2  . BREO ELLIPTA 100-25 MCG/INH AEPB INHALE 1 PUFF INTO THE LUNGS DAILY 60 each 2  . eletriptan (RELPAX) 20 MG tablet Take 1 tablet (20 mg total) by mouth as needed for migraine. 10 tablet 11  . folic acid (FOLVITE) 1 MG tablet TAKE 2 TABLETS(2 MG) BY MOUTH DAILY 180 tablet 3  . Fremanezumab-vfrm (Jill Harper) 225 MG/1.5ML SOAJ Inject 225 mg into the skin every 30 (thirty) days. 1 pen 11  . guaiFENesin (MUCINEX) 600 MG 12 hr tablet Take 2 tablets (1,200 mg total) by mouth 2 (two) times daily as needed for cough or to loosen phlegm. (Patient taking differently: Take 1,200 mg by mouth 2 (two) times daily. )    . metFORMIN (GLUCOPHAGE) 500 MG tablet Take 1 tablet (500 mg total) by mouth 2 (two) times daily with a meal. 60 tablet 0  . Multiple Vitamins-Minerals (MULTIVITAMIN GUMMIES ADULT PO) Take 2 each by mouth daily.    [name] Respiratory Therapy Supplies (FLUTTER) DEVI Use as directed (Patient taking differently: 2 (two) times daily. Use as directed) 1 each 0  . telmisartan (MICARDIS) 20 MG tablet TAKE 1 TABLET(20 MG) BY MOUTH DAILY 90 tablet 1  . VITAMIN D PO Take by mouth.     No current facility-administered medications for this visit.    Allergies as of 09/23/2019 - Review Complete 09/23/2019  Allergen Reaction  Noted  . Sulfonamide derivatives      Vitals: BP 120/73 (BP Location: Left Arm, Patient Position: Sitting)   Pulse 71   Temp (!) 97.4 F (36.3 C) Comment: taken at front  Ht _0  (1.727 m)   Wt 261 lb (118.4 kg)   BMI 39.68 kg/m  Last Weight:  Wt Readings from Last 1 Encounters:  09/23/19 261 lb (118.4 kg)   Last Height:   Ht Readings from Last 1 Encounters:  09/23/19 _1  (1.727 m)   Physical exam: Exam: Gen: NAD, conversant, well nourised, obese, well groomed                     CV: RRR, no MRG. No Carotid Bruits. No peripheral edema, warm, nontender Eyes: Conjunctivae clear without exudates or hemorrhage  Neuro: Detailed Neurologic Exam  Speech:    Speech is normal; fluent and spontaneous with normal comprehension.  Cognition:    The patient is oriented to person, place, and time;     recent and remote memory intact;     language fluent;     normal attention, concentration,     fund of knowledge Cranial Nerves:    The pupils are equal, round, and reactive to light.  Visual fields are full to finger confrontation. Extraocular movements are intact. Trigeminal sensation is intact and the muscles of mastication are normal. The face is symmetric. The palate elevates in the midline. Hearing intact. Voice is normal. Shoulder shrug is normal. The tongue has normal motion without fasciculations.   Motor Observation:    No asymmetry, no atrophy, and no involuntary movements noted. Tone:    Normal muscle tone.    Posture:    Posture is normal.  Strength: left arm proximal weakness otherwise strength is V/V in the upper and lower limbs.      Sensation: intact to LT      Assessment/Plan:   54 y.o. female here as requested by Dr. Quay Harper for headaches and now here for new request of evaluation of hand pain. PMHx RA, bronchiectasis, polymyositis on imuran follows with Rheumatology, never smoked, interstitial lung disease, fibromyalgia, migraines, mitral valve prolapse,  abnormal liver function test. She has migraines and occipital neuralgia.   - EMG/NCS with cts on the right, left ulnar, steroids helped, say dr Fredna Dow and monitoring - Vascular evaluation, likely raynauds or autoimmune, following with Rheumatology - Jill Harper working for migraines not daily headaches, she can have an occassional headache, and it generally subsides on its own occipital on the left feels hot(alsohas occipital neuralgia).  - continues 02 at sleep, she follows with pulmonology - still following with shaili deveshwar not her husband and we can order surveillance imaging CTA head and neck for FMD every 1-2 years last 07/07/2019. Continues ASA and watches blood pressure.  Meds ordered this encounter  Medications  . Fremanezumab-vfrm (Jill Harper) 225 MG/1.5ML SOAJ    Sig: Inject 225 mg into the skin every 30 (thirty) days.    Dispense:  1 pen    Refill:  11  . eletriptan (RELPAX) 20 MG tablet    Sig: Take 1 tablet (20 mg total) by mouth as needed for migraine.    Dispense:  10 tablet    Refill:  11    PRIOR:  - She completed sleep evaluation, AHI 5, decreased O2 at nigh, on O2 now at night - FMD: ASA and HTN control, repeat CTA head and neck due to new symptoms - Migraines and occipital neuralgia: Start Jill Harper for migraines.  - MRI cervical spine for cervical radic.  She has had neck pain, she has numbness and tingling in the left hand and arm, ongoing for >  6 Months, failed conservative measures, under the care of a physician for years for this.  Needs MRI c-spine for cervical radic as well as cervico-occipital pain. - She has some brain fog, discussed normal aging, chronic pain and lifestyle factors. B12 575.  - Obesity: healthy weight and wellness center referral, doing great as lost 25 pounds. - repeat CTAngio of the head and neck due to pain radiating in the neck, for surveillance of her FMD and to rule out any carotid involvement (feels a "cord" where the carotid artery shows, rule  out carotid dissection has FMD in the carotids)  To prevent or relieve headaches, try the following: Cool Compress. Lie down and place a cool compress on your head.  Avoid headache triggers. If certain foods or odors seem to have triggered your migraines in the past, avoid them. A headache diary might help you identify triggers.  Include physical activity in your daily routine. Try a daily walk or other moderate aerobic exercise.  Manage stress. Find healthy ways to cope with the stressors, such as delegating tasks on your to-do list.  Practice relaxation techniques. Try deep breathing, yoga, massage and visualization.  Eat regularly. Eating regularly scheduled meals and maintaining a healthy diet might help prevent headaches. Also, drink plenty of fluids.  Follow a regular sleep schedule. Sleep deprivation might contribute to headaches Consider biofeedback. With this mind-body technique, you learn to control certain bodily functions -- such as muscle tension, heart rate and blood pressure -- to prevent headaches or reduce headache pain.  Proceed to emergency room if you experience new or worsening symptoms or symptoms do not resolve, if you have new neurologic symptoms or if headache is severe, or for any concerning symptom.   Provided education and documentation from American headache Society toolbox including articles on: chronic migraine medication overuse headache, chronic migraines, prevention of migraines, behavioral and other nonpharmacologic treatments for headache.    Cc:   Jill Rail, MD, Dr.Deveshwar  Sarina Ill, MD  [facility] [address] [place], [place] [address]  Phone [phone] Fax [phone]  A total of 30 minutes was spent face-to-face with this patient. Over half this time was spent on counseling patient on the  1. Fibromuscular dysplasia of cervicocranial artery ([place])   2. Chronic migraine without aura, with  intractable migraine, so stated, with status migrainosus   3. Raynaud's disease without gangrene    diagnosis and different diagnostic and therapeutic options, counseling and coordination of care, risks ans benefits of management, compliance, or risk factor reduction and education.

## 2019-09-23 ENCOUNTER — Other Ambulatory Visit: Payer: Self-pay

## 2019-09-23 ENCOUNTER — Encounter: Payer: Self-pay | Admitting: Neurology

## 2019-09-23 ENCOUNTER — Ambulatory Visit: Payer: Federal, State, Local not specified - PPO | Admitting: Neurology

## 2019-09-23 VITALS — BP 120/73 | HR 71 | Temp 97.4°F | Ht 68.0 in | Wt 261.0 lb

## 2019-09-23 DIAGNOSIS — G43711 Chronic migraine without aura, intractable, with status migrainosus: Secondary | ICD-10-CM | POA: Diagnosis not present

## 2019-09-23 DIAGNOSIS — I7300 Raynaud's syndrome without gangrene: Secondary | ICD-10-CM | POA: Diagnosis not present

## 2019-09-23 DIAGNOSIS — I773 Arterial fibromuscular dysplasia: Secondary | ICD-10-CM | POA: Diagnosis not present

## 2019-09-23 MED ORDER — ELETRIPTAN HYDROBROMIDE 20 MG PO TABS: 20.0000 mg | ORAL_TABLET | ORAL | 11 refills | Status: DC | PRN

## 2019-09-23 MED ORDER — AJOVY 225 MG/1.5ML ~~LOC~~ SOAJ: 225.0000 mg | SUBCUTANEOUS | 11 refills | Status: DC

## 2019-09-29 DIAGNOSIS — J471 Bronchiectasis with (acute) exacerbation: Secondary | ICD-10-CM | POA: Diagnosis not present

## 2019-09-29 DIAGNOSIS — G4733 Obstructive sleep apnea (adult) (pediatric): Secondary | ICD-10-CM | POA: Diagnosis not present

## 2019-09-29 NOTE — Telephone Encounter (Signed)
Received another Ajovy PA. I completed this on Cover My Meds. Key: [id number]. Awaiting BC[employer] determination.

## 2019-10-01 ENCOUNTER — Encounter: Payer: Self-pay | Admitting: *Deleted

## 2019-10-01 NOTE — Telephone Encounter (Signed)
Received fax from [facility]. Ajovy is not covered on the benefit plan. If we should wish to discuss more information about covered alternatives or products not covered, call [phone]. I messaged pt in mychart to let her know she can continue to use the savings card.

## 2019-10-05 DIAGNOSIS — J471 Bronchiectasis with (acute) exacerbation: Secondary | ICD-10-CM | POA: Diagnosis not present

## 2019-10-05 DIAGNOSIS — G4733 Obstructive sleep apnea (adult) (pediatric): Secondary | ICD-10-CM | POA: Diagnosis not present

## 2019-10-12 ENCOUNTER — Encounter (INDEPENDENT_AMBULATORY_CARE_PROVIDER_SITE_OTHER): Payer: Self-pay | Admitting: Family Medicine

## 2019-10-12 ENCOUNTER — Ambulatory Visit (INDEPENDENT_AMBULATORY_CARE_PROVIDER_SITE_OTHER): Payer: Federal, State, Local not specified - PPO | Admitting: Family Medicine

## 2019-10-12 ENCOUNTER — Other Ambulatory Visit: Payer: Self-pay

## 2019-10-12 VITALS — BP 110/70 | HR 62 | Temp 98.0°F | Ht 68.0 in | Wt 259.0 lb

## 2019-10-12 DIAGNOSIS — G4733 Obstructive sleep apnea (adult) (pediatric): Secondary | ICD-10-CM | POA: Diagnosis not present

## 2019-10-12 DIAGNOSIS — Z9189 Other specified personal risk factors, not elsewhere classified: Secondary | ICD-10-CM

## 2019-10-12 DIAGNOSIS — Z6839 Body mass index (BMI) 39.0-39.9, adult: Secondary | ICD-10-CM

## 2019-10-12 DIAGNOSIS — J849 Interstitial pulmonary disease, unspecified: Secondary | ICD-10-CM | POA: Diagnosis not present

## 2019-10-12 DIAGNOSIS — R7303 Prediabetes: Secondary | ICD-10-CM

## 2019-10-12 DIAGNOSIS — M069 Rheumatoid arthritis, unspecified: Secondary | ICD-10-CM

## 2019-10-12 DIAGNOSIS — R5383 Other fatigue: Secondary | ICD-10-CM | POA: Diagnosis not present

## 2019-10-13 LAB — VITAMIN D 25 HYDROXY (VIT D DEFICIENCY, FRACTURES): Vit D, 25-Hydroxy: 27.5 ng/mL — ABNORMAL LOW (ref 30.0–100.0)

## 2019-10-13 LAB — HEMOGLOBIN A1C
Est. average glucose Bld gHb Est-mCnc: 111 mg/dL
Hgb A1c MFr Bld: 5.5 % (ref 4.8–5.6)

## 2019-10-13 LAB — COMPREHENSIVE METABOLIC PANEL
ALT: 11 IU/L (ref 0–32)
AST: 17 IU/L (ref 0–40)
Albumin/Globulin Ratio: 1.5 (ref 1.2–2.2)
Albumin: 4.6 g/dL (ref 3.8–4.9)
Alkaline Phosphatase: 76 IU/L (ref 39–117)
BUN/Creatinine Ratio: 20 (ref 9–23)
BUN: 11 mg/dL (ref 6–24)
Bilirubin Total: 0.3 mg/dL (ref 0.0–1.2)
CO2: 21 mmol/L (ref 20–29)
Calcium: 9.9 mg/dL (ref 8.7–10.2)
Chloride: 106 mmol/L (ref 96–106)
Creatinine, Ser: 0.56 mg/dL — ABNORMAL LOW (ref 0.57–1.00)
GFR calc Af Amer: 123 mL/min/{1.73_m2} (ref >59)
GFR calc non Af Amer: 107 mL/min/{1.73_m2} (ref >59)
Globulin, Total: 3.1 g/dL (ref 1.5–4.5)
Glucose: 91 mg/dL (ref 65–99)
Potassium: 4.5 mmol/L (ref 3.5–5.2)
Sodium: 141 mmol/L (ref 134–144)
Total Protein: 7.7 g/dL (ref 6.0–8.5)

## 2019-10-13 LAB — ANEMIA PANEL
Ferritin: 66 ng/mL (ref 15–150)
Folate, Hemolysate: 448 ng/mL
Folate, RBC: 1069 ng/mL (ref >498)
Hematocrit: 41.9 % (ref 34.0–46.6)
Iron Saturation: 17 % (ref 15–55)
Iron: 68 ug/dL (ref 27–159)
Retic Ct Pct: 1.4 % (ref 0.6–2.6)
Total Iron Binding Capacity: 389 ug/dL (ref 250–450)
UIBC: 321 ug/dL (ref 131–425)
Vitamin B-12: 1086 pg/mL (ref 232–1245)

## 2019-10-13 LAB — CBC WITH DIFFERENTIAL/PLATELET
Basophils Absolute: 0 10*3/uL (ref 0.0–0.2)
Basos: 1 %
EOS (ABSOLUTE): 0.1 10*3/uL (ref 0.0–0.4)
Eos: 2 %
Hemoglobin: 14.2 g/dL (ref 11.1–15.9)
Immature Grans (Abs): 0 10*3/uL (ref 0.0–0.1)
Immature Granulocytes: 1 %
Lymphocytes Absolute: 1.3 10*3/uL (ref 0.7–3.1)
Lymphs: 19 %
MCH: 29.2 pg (ref 26.6–33.0)
MCHC: 33.9 g/dL (ref 31.5–35.7)
MCV: 86 fL (ref 79–97)
Monocytes Absolute: 0.5 10*3/uL (ref 0.1–0.9)
Monocytes: 8 %
Neutrophils Absolute: 4.5 10*3/uL (ref 1.4–7.0)
Neutrophils: 69 %
Platelets: 292 10*3/uL (ref 150–450)
RBC: 4.87 x10E6/uL (ref 3.77–5.28)
RDW: 12.1 % (ref 11.7–15.4)
WBC: 6.5 10*3/uL (ref 3.4–10.8)

## 2019-10-13 LAB — LIPID PANEL
Chol/HDL Ratio: 2.5 ratio (ref 0.0–4.4)
Cholesterol, Total: 154 mg/dL (ref 100–199)
HDL: 61 mg/dL (ref >39)
LDL Chol Calc (NIH): 83 mg/dL (ref 0–99)
Triglycerides: 43 mg/dL (ref 0–149)
VLDL Cholesterol Cal: 10 mg/dL (ref 5–40)

## 2019-10-13 LAB — T3: T3, Total: 116 ng/dL (ref 71–180)

## 2019-10-13 LAB — INSULIN, RANDOM: INSULIN: 15.8 u[IU]/mL (ref 2.6–24.9)

## 2019-10-13 LAB — T4, FREE: Free T4: 1.17 ng/dL (ref 0.82–1.77)

## 2019-10-13 LAB — TSH: TSH: 4.04 u[IU]/mL (ref 0.450–4.500)

## 2019-10-13 NOTE — Progress Notes (Signed)
Chief Complaint:   OBESITY Jill Harper is here to discuss her progress with her obesity treatment plan along with follow-up of her obesity related diagnoses. Jill Harper is on the Category 2 Plan and states she is following her eating plan approximately 95% of the time. Jill Harper states she is exercising for 0 minutes 0 times per week.  Today's visit was #: 21 Starting weight: 273 lbs Starting date: 10/09/2018 Today's weight: 259 lbs Today's date: 10/12/2019 Total lbs lost to date: 14 lbs Total lbs lost since last in-office visit: 0  Interim History: Jill Harper has been following the plan well.  She has not been doing any exercise.  She says her hunger is well controlled.  Subjective:   1. Prediabetes Jill Harper has a diagnosis of prediabetes based on her elevated HgA1c and was informed this puts her at greater risk of developing diabetes. She continues to work on diet and exercise to decrease her risk of diabetes. She denies nausea or hypoglycemia.  Lab Results  Component Value Date   HGBA1C 5.5 10/12/2019   Lab Results  Component Value Date   INSULIN WILL FOLLOW 10/12/2019   INSULIN 22.4 10/09/2018   2. OSA (obstructive sleep apnea) Jill Harper has a diagnosis of sleep apnea. She reports that she is not using a CPAP regularly.   3. ILD (interstitial lung disease) ([place]) Jill Harper has interstitial lung disease.  4. Other fatigue Jill Harper reports having increased fatigue.  5. Rheumatoid arthritis, involving unspecified site, unspecified whether rheumatoid factor present ([place]) Jill Harper has the diagnosis of rheumatoid arthritis.  6. At risk for heart disease Jill Harper is at a higher than average risk for cardiovascular disease due to obesity. Reviewed: no chest pain on exertion, no dyspnea on exertion, and no swelling of ankles.  Assessment/Plan:   1. Prediabetes Jill Harper will continue to work on weight loss, exercise, and decreasing simple carbohydrates to help decrease the risk of diabetes.   Orders - Hemoglobin A1c -  Insulin, random  2. OSA (obstructive sleep apnea) Intensive lifestyle modifications are the first line treatment for this issue. We discussed several lifestyle modifications today and she will continue to work on diet, exercise and weight loss efforts. We will continue to monitor. Orders and follow up as documented in patient record.   Counseling  Sleep apnea is a condition in which breathing pauses or becomes shallow during sleep. This happens over and over during the night. This disrupts your sleep and keeps your body from getting the rest that it needs, which can cause tiredness and lack of energy (fatigue) during the day.  Sleep apnea treatment: If you were given a device to open your airway while you sleep, USE IT!  Sleep hygiene:   Limit or avoid alcohol, caffeinated beverages, and cigarettes, especially close to bedtime.   Do not eat a large meal or eat spicy foods right before bedtime. This can lead to digestive discomfort that can make it hard for you to sleep.  Keep a sleep diary to help you and your health care provider figure out what could be causing your insomnia.  . Make your bedroom a dark, comfortable place where it is easy to fall asleep. ? Put up shades or blackout curtains to block light from outside. ? Use a white noise machine to block noise. ? Keep the temperature cool. . Limit screen use before bedtime. This includes: ? Watching TV. ? Using your smartphone, tablet, or computer. . Stick to a routine that includes going to bed and waking  up at the same times every day and night. This can help you fall asleep faster. Consider making a quiet activity, such as reading, part of your nighttime routine. . Try to avoid taking naps during the day so that you sleep better at night. . Get out of bed if you are still awake after 15 minutes of trying to sleep. Keep the lights down, but try reading or doing a quiet activity. When you feel sleepy, go back to bed.  3. ILD  (interstitial lung disease) ([place]) Will continue to follow along.  Orders - Lipid panel  4. Other fatigue Will check labs today, as below.  Orders - Comprehensive metabolic panel - CBC with Differential/Platelet - VITAMIN D 25 Hydroxy (Vit-D Deficiency, Fractures) - TSH - T4, free - T3 - Anemia panel  5. Rheumatoid arthritis, involving unspecified site, unspecified whether rheumatoid factor present ([place]) Will follow because mobility and pain control are important for weight management.  6. At risk for heart disease Jill Harper was given approximately 15 minutes of coronary artery disease prevention counseling today. She is 54 y.o. female and has risk factors for heart disease including obesity. We discussed intensive lifestyle modifications today with an emphasis on specific weight loss instructions and strategies.   Repetitive spaced learning was employed today to elicit superior memory formation and behavioral change.  7. Class 2 severe obesity with serious comorbidity and body mass index (BMI) of 39.0 to 39.9 in adult, unspecified obesity type (HCC) Jill Harper is currently in the action stage of change. As such, her goal is to continue with weight loss efforts. She has agreed to the Category 2 Plan.   Exercise goals: As tolerated.  Reviewed water aerobics options.  Behavioral modification strategies: increasing lean protein intake.  Jill Harper has agreed to follow-up with our clinic in 2 weeks. She was informed of the importance of frequent follow-up visits to maximize her success with intensive lifestyle modifications for her multiple health conditions.   Jill Harper was informed we would discuss her lab results at her next visit unless there is a critical issue that needs to be addressed sooner. Jill Harper agreed to keep her next visit at the agreed upon time to discuss these results.  Objective:   Blood pressure 110/70, pulse 62, temperature 98 F (36.7 C), temperature source Oral, height 5\' 8"  (1.727  m), weight 259 lb (117.5 kg), SpO2 99 %. Body mass index is 39.38 kg/m.  General: Cooperative, alert, well developed, in no acute distress. HEENT: Conjunctivae and lids unremarkable. Cardiovascular: Regular rhythm.  Lungs: Normal work of breathing. Neurologic: No focal deficits.   Lab Results  Component Value Date   CREATININE 0.56 (L) 10/12/2019   BUN 11 10/12/2019   NA 141 10/12/2019   K 4.5 10/12/2019   CL 106 10/12/2019   CO2 21 10/12/2019   Lab Results  Component Value Date   ALT 11 10/12/2019   AST 17 10/12/2019   ALKPHOS 76 10/12/2019   BILITOT 0.3 10/12/2019   Lab Results  Component Value Date   HGBA1C 5.5 10/12/2019   HGBA1C 5.5 10/09/2018   HGBA1C 4.8 04/24/2016   Lab Results  Component Value Date   INSULIN WILL FOLLOW 10/12/2019   INSULIN 22.4 10/09/2018   Lab Results  Component Value Date   TSH 4.040 10/12/2019   Lab Results  Component Value Date   CHOL 154 10/12/2019   HDL 61 10/12/2019   LDLCALC 83 10/12/2019   TRIG 43 10/12/2019   CHOLHDL 2.5 10/12/2019  Lab Results  Component Value Date   WBC 6.5 10/12/2019   HGB 14.2 10/12/2019   HCT 41.9 10/12/2019   MCV 86 10/12/2019   PLT 292 10/12/2019   Lab Results  Component Value Date   IRON 68 10/12/2019   TIBC 389 10/12/2019   FERRITIN 66 10/12/2019   Attestation Statements:   Reviewed by clinician on day of visit: allergies, medications, problem list, medical history, surgical history, family history, social history, and previous encounter notes.  I, Water quality scientist, CMA, am acting as Location manager for [employer], DO.  I have reviewed the above documentation for accuracy and completeness, and I agree with the above. Briscoe Deutscher, DO

## 2019-10-19 ENCOUNTER — Other Ambulatory Visit (INDEPENDENT_AMBULATORY_CARE_PROVIDER_SITE_OTHER): Payer: Self-pay | Admitting: Family Medicine

## 2019-10-19 DIAGNOSIS — R7303 Prediabetes: Secondary | ICD-10-CM

## 2019-10-19 NOTE — Progress Notes (Signed)
Subjective:    Patient ID: Jill Harper, female    DOB: January 07, 1966, 54 y.o.   MRN: [id number]  HPI The patient is here for follow up of their chronic medical problems, including hypertension, fibromuscular dysplasia  She is taking her medication daily as prescribed.  She follows with multiple specialists.  The bottom of her feet go numb intermittently there is no tingling or pain.   She has intermittent b/l thumb and index fingers that go purple.  She saw vascular and they thought it was likely autoimmune.  They did not see any blood clot or vascular issues.  She does have an upcoming appointment with rheumatology.     She has b/l shoulder pain - ? Related to RA versus OA.  She will discuss this with rheumatology.   She does use oxygen at night and occasionally during the day.  She has intermittent shortness of breath and coughing.  She does follow with pulmonary.  She is hoping she will be able to get into pulmonary rehab once they open back up.    Medications and allergies reviewed with patient and updated if appropriate.  Patient Active Problem List   Diagnosis Date Noted  . Bilateral carpal tunnel syndrome 07/21/2019  . Costochondritis 03/16/2019  . Rib pain on right side 03/16/2019  . Insulin resistance 11/27/2018  . Class 3 severe obesity with serious comorbidity and body mass index (BMI) of 40.0 to 44.9 in adult [facility]) 11/27/2018  . Fibromuscular dysplasia ([place]) 10/17/2018  . Hypertension 10/17/2018  . Bronchiectasis with (acute) exacerbation ([place]) 10/08/2018  . OSA (obstructive sleep apnea) 08/20/2018  . Rheumatoid arthritis ([place]) 04/17/2018  . ILD (interstitial lung disease) ([place]) 04/17/2018  . Dyspnea 04/01/2018  . Elevated TSH 03/18/2018  . Bronchiectasis ([place]) 09/02/2017  . LVH (left ventricular hypertrophy) 06/02/2017  . Mitral regurgitation 05/22/2017  . Wheezing 04/05/2017  . Jo-1 antibody positive 01/29/2017  . Primary osteoarthritis of both feet  01/29/2017  . Primary osteoarthritis of both hands 01/29/2017  . Left shoulder tendonitis 01/17/2017  . Vitamin D deficiency 04/26/2016  . Cough 07/18/2015  . MVP (mitral valve prolapse)   . Erythema nodosum   . INCONTINENCE, URGE 05/26/2010  . Migraine headache 02/15/2010  . Fibromyalgia 06/20/2009  . Polymyositis ([place]) 02/11/2008  . BRONCHIECTASIS 02/10/2008    Current Outpatient Medications on File Prior to Visit  Medication Sig Dispense Refill  . albuterol (PROVENTIL HFA) 108 (90 Base) MCG/ACT inhaler Inhale 2 puffs into the lungs 4 (four) times daily as needed. Reported on 09/03/2015 1 Inhaler 4  . albuterol (PROVENTIL) (2.5 MG/3ML) 0.083% nebulizer solution Take 3 mLs (2.5 mg total) by nebulization every 6 (six) hours as needed for wheezing or shortness of breath. 120 vial 5  . aspirin EC 81 MG tablet Take 81 mg by mouth daily.    [name] atenolol (TENORMIN) 25 MG tablet TAKE 1/2 TABLET BY MOUTH DAILY AS NEEDED FOR MITRAL PROLAPSE 45 tablet 0  . azaTHIOprine (IMURAN) 50 MG tablet TAKE 2 TABLETS(100 MG) BY MOUTH DAILY 60 tablet 2  . BREO ELLIPTA 100-25 MCG/INH AEPB INHALE 1 PUFF INTO THE LUNGS DAILY 60 each 2  . eletriptan (RELPAX) 20 MG tablet Take 1 tablet (20 mg total) by mouth as needed for migraine. 10 tablet 11  . folic acid (FOLVITE) 1 MG tablet TAKE 2 TABLETS(2 MG) BY MOUTH DAILY 180 tablet 3  . Fremanezumab-vfrm (AJOVY) 225 MG/1.5ML SOAJ Inject 225 mg into the skin every 30 (thirty) days. 1  pen 11  . guaiFENesin (MUCINEX) 600 MG 12 hr tablet Take 2 tablets (1,200 mg total) by mouth 2 (two) times daily as needed for cough or to loosen phlegm. (Patient taking differently: Take 1,200 mg by mouth 2 (two) times daily. )    . metFORMIN (GLUCOPHAGE) 500 MG tablet TAKE 1 TABLET(500 MG) BY MOUTH TWICE DAILY WITH A MEAL 60 tablet 0  . Multiple Vitamins-Minerals (MULTIVITAMIN GUMMIES ADULT PO) Take 2 each by mouth daily.    [name] Respiratory Therapy Supplies (FLUTTER) DEVI Use as directed  (Patient taking differently: 2 (two) times daily. Use as directed) 1 each 0  . VITAMIN D PO Take by mouth.     No current facility-administered medications on file prior to visit.    Past Medical History:  Diagnosis Date  . Abnormal liver function test   . Bronchiectasis       . Dyspnea   . Fibromuscular dysplasia ([place])   . Fibromyalgia   . Heart murmur   . ILD (interstitial lung disease) ([place])   . Joint pain   . Menorrhagia   . MIGRAINE HEADACHE   . MVP (mitral valve prolapse)   . Osteoarthritis   . Polymyositis ([place])    Dr [name]; chronic MTX  . Pulmonary fibrosis ([place])   . Rheumatoid arthritis ([place])   . Sleep apnea   . Vitamin B 12 deficiency   . Vitamin D deficiency     Past Surgical History:  Procedure Laterality Date  . ablation uterine  2010  . CARDIAC CATHETERIZATION  2002   normal  . DILATION AND CURETTAGE OF UTERUS  08-31-08   Dr [name]  . IR ANGIO INTRA EXTRACRAN SEL COM CAROTID INNOMINATE BILAT MOD SED  09/18/2018  . IR ANGIO VERTEBRAL SEL VERTEBRAL BILAT MOD SED  09/18/2018  . IR US GUIDE VASC ACCESS RIGHT  09/18/2018    Social History   Socioeconomic History  . Marital status: Married    Spouse name: Kitrina Maurin  . Number of children: 1  . Years of education: Not on file  . Highest education level: High school graduate  Occupational History  . Occupation: Compliant and Injury Clerk  Tobacco Use  . Smoking status: Never Smoker  . Smokeless tobacco: Never Used  . Tobacco comment: Married, lives with spouse. 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 Alcohol use: Not Currently    Comment: maybe a wine cooler once every other year  . Drug use: Never  . Sexual activity: Yes    Birth control/protection: Surgical  Other Topics Concern  . Not on file  Social History Narrative   Exercise: trying to walk - limited by fatigue   Lives at home with husband    Right handed    Caffeine: occasional coke 1-2x per month   Social Determinants of Health   Financial Resource Strain:   . Difficulty of Paying Living Expenses:   Food Insecurity:   . Worried About Charity fundraiser in the Last Year:   . Arboriculturist in the Last Year:   Transportation Needs:   . Film/video editor (Medical):   [name] Lack of Transportation (Non-Medical):   Physical Activity:   . Days of Exercise per Week:   . Minutes of Exercise per Session:   Stress:   . Feeling of Stress :   Social Connections:   . Frequency of Communication with Friends  and Family:   . Frequency of Social Gatherings with Friends and Family:   . Attends Religious Services:   . Active Member of Clubs or Organizations:   . Attends Archivist Meetings:   [name] Marital Status:     Family History  Problem Relation Age of Onset  . Diabetes Mother   . Fibromyalgia Mother   . Ulcers Mother   . Heart failure Mother   . Thyroid disease Mother   . Obesity Mother   . Multiple myeloma Father   . Hypertension Father   . Stroke Father   . Lupus Sister   . Other Sister        abdominal adhesions resulting in bowel obstruction  . Migraines Sister   . Heart disease Brother        bypass surgery  . Rheum arthritis Sister   . Multiple myeloma Sister   . Hypertension Sister   . Heart attack Sister   . Diabetes Sister   . Hypertension Sister   . Rheum arthritis Sister   . Diabetes Sister   . Diabetes Brother   . Headache Other        siblings with headaches but not diagnosed as migraines    Review of Systems  Constitutional: Positive for fatigue. Negative for chills and fever.  Respiratory: Positive for cough (some days, dry) and shortness of breath (sometimes DOE). Negative for chest tightness and wheezing.   Cardiovascular: Negative for chest pain, palpitations and leg swelling.  Neurological: Positive for headaches (migraines controlled, ). Negative for dizziness.       Objective:   Vitals:     10/20/19 1529  BP: 118/72  Pulse: 85  Resp: 16  Temp: 98.1 F (36.7 C)  SpO2: 99%   BP Readings from Last 3 Encounters:  10/20/19 118/72  10/12/19 110/70  09/23/19 120/73   Wt Readings from Last 3 Encounters:  10/20/19 264 lb (119.7 kg)  10/12/19 259 lb (117.5 kg)  09/23/19 261 lb (118.4 kg)   Body mass index is 40.14 kg/m.   Physical Exam    Constitutional: Appears well-developed and well-nourished. No distress.  HENT:  Head: Normocephalic and atraumatic.  Neck: Neck supple. No tracheal deviation present. No thyromegaly present.  No cervical lymphadenopathy Cardiovascular: Normal rate, regular rhythm and normal heart sounds.   No murmur heard. No carotid bruit .  No edema Pulmonary/Chest: Effort normal and breath sounds normal. No respiratory distress. No has no wheezes. No rales.  Skin: Skin is warm and dry. Not diaphoretic.  Psychiatric: Normal mood and affect. Behavior is normal.      Assessment & Plan:    See Problem List for Assessment and Plan of chronic medical problems.    This visit occurred during the SARS-CoV-2 public health emergency.  Safety protocols were in place, including screening questions prior to the visit, additional usage of staff PPE, and extensive cleaning of exam room while observing appropriate contact time as indicated for disinfecting solutions.

## 2019-10-20 ENCOUNTER — Encounter: Payer: Self-pay | Admitting: Internal Medicine

## 2019-10-20 ENCOUNTER — Ambulatory Visit: Payer: Federal, State, Local not specified - PPO | Admitting: Internal Medicine

## 2019-10-20 ENCOUNTER — Other Ambulatory Visit: Payer: Self-pay

## 2019-10-20 VITALS — BP 118/72 | HR 85 | Temp 98.1°F | Resp 16 | Ht 68.0 in | Wt 264.0 lb

## 2019-10-20 DIAGNOSIS — M069 Rheumatoid arthritis, unspecified: Secondary | ICD-10-CM | POA: Diagnosis not present

## 2019-10-20 DIAGNOSIS — G43809 Other migraine, not intractable, without status migrainosus: Secondary | ICD-10-CM | POA: Diagnosis not present

## 2019-10-20 DIAGNOSIS — I773 Arterial fibromuscular dysplasia: Secondary | ICD-10-CM

## 2019-10-20 DIAGNOSIS — I10 Essential (primary) hypertension: Secondary | ICD-10-CM

## 2019-10-20 MED ORDER — TELMISARTAN 20 MG PO TABS: ORAL_TABLET | ORAL | 1 refills | Status: DC

## 2019-10-20 NOTE — Assessment & Plan Note (Signed)
Chronic BP well controlled Current regimen effective and well tolerated Continue current medications at current doses  

## 2019-10-20 NOTE — Assessment & Plan Note (Signed)
Management per rheumatology On Imuran Overall she feels her RA arthritis is controlled

## 2019-10-20 NOTE — Patient Instructions (Addendum)
Dr Gwenlyn Found with cardiology is who saw the other patient with fibromuscular dysplasia sees.      Medications reviewed and updated.  Changes include :   none  Your prescription(s) have been submitted to your pharmacy. Please take as directed and contact our office if you believe you are having problem(s) with the medication(s).   Please followup in 6 months

## 2019-10-20 NOTE — Assessment & Plan Note (Signed)
Chronic Taking aspirin 81 mg daily Blood pressure well controlled on telmisartan, atenolol Getting annual imaging of head and neck by neurology Discussed possible need for annual imaging of chest abdomen pelvis-can consider discussing with cardiology

## 2019-10-20 NOTE — Assessment & Plan Note (Signed)
Currently controlled Managed by neurology

## 2019-10-27 DIAGNOSIS — J471 Bronchiectasis with (acute) exacerbation: Secondary | ICD-10-CM | POA: Diagnosis not present

## 2019-10-27 DIAGNOSIS — G4733 Obstructive sleep apnea (adult) (pediatric): Secondary | ICD-10-CM | POA: Diagnosis not present

## 2019-10-28 ENCOUNTER — Other Ambulatory Visit: Payer: Self-pay

## 2019-10-28 ENCOUNTER — Encounter: Payer: Self-pay | Admitting: Rheumatology

## 2019-10-28 ENCOUNTER — Encounter (INDEPENDENT_AMBULATORY_CARE_PROVIDER_SITE_OTHER): Payer: Self-pay | Admitting: Family Medicine

## 2019-10-28 ENCOUNTER — Telehealth (INDEPENDENT_AMBULATORY_CARE_PROVIDER_SITE_OTHER): Payer: Federal, State, Local not specified - PPO | Admitting: Family Medicine

## 2019-10-28 DIAGNOSIS — E559 Vitamin D deficiency, unspecified: Secondary | ICD-10-CM

## 2019-10-28 DIAGNOSIS — I10 Essential (primary) hypertension: Secondary | ICD-10-CM

## 2019-10-28 DIAGNOSIS — R7303 Prediabetes: Secondary | ICD-10-CM

## 2019-10-28 DIAGNOSIS — Z6839 Body mass index (BMI) 39.0-39.9, adult: Secondary | ICD-10-CM

## 2019-10-28 DIAGNOSIS — G4733 Obstructive sleep apnea (adult) (pediatric): Secondary | ICD-10-CM | POA: Diagnosis not present

## 2019-10-28 NOTE — Progress Notes (Signed)
Office Visit Note  Patient: Jill Harper             Date of Birth: 1966/06/03           MRN: [id number]             PCP: Binnie Rail, MD Referring: Binnie Rail, MD Visit Date: 11/04/2019 Occupation: _0 @  Subjective:  Pain in both hands   History of Present Illness: Jill Harper is a 54 y.o. female with history of seropositive rheumatoid arthritis, osteoarthritis, and fibromyalgia.  Patient is taking Imuran 100 mg by mouth daily.  She has not missed any doses of Imuran recently.  She states that after her last office visit she had a consult with Dr. Jaynee Eagles and at the vascular center for evaluation of cyanosis under her fingernails.  According to the patient she had a nerve conduction study which revealed bilateral carpal tunnel.  She was not experiencing any symptoms of paresthesias at that time.  She was evaluated by Dr. Fredna Dow but did not want to proceed with surgical release at that time.  She states she was also diagnosed with left ulnar neuropathy and occasionally experiences paresthesias in the left fifth digit.  She presents today with increased pain and swelling in both hands.  She reports that she started having a flare of rheumatoid arthritis in both hands about 1 week ago after receiving her first COVID-19 vaccination.  She states she is also been experiencing increased fatigue since then.  She has ongoing pain in both shoulder joints.  She states that she fell about 1 and half weeks ago and has had discomfort in the right trapezius since then.  She has been applying Biofreeze topically and her symptoms have been improving gradually. She denies any increased shortness of breath or coughing.  She has not had any bronchiectasis flares recently.  She has been following up with Dr. Quay Burow.  She has a pulse ox at home and occasionally will check her pulse ox. She continues to have intermittent myalgias and muscle weakness and states that it is hard to determine if it is due to  fibromyalgia or polymyositis.  Activities of Daily Living:  Patient reports morning stiffness for 5  minutes.   Patient Reports nocturnal pain.  Difficulty dressing/grooming: Reports Difficulty climbing stairs: Reports Difficulty getting out of chair: Reports Difficulty using hands for taps, buttons, cutlery, and/or writing: Reports  Review of Systems  Constitutional: Positive for fatigue.  HENT: Negative for mouth sores, mouth dryness and nose dryness.   Eyes: Negative for pain, visual disturbance and dryness.  Respiratory: Negative for cough, hemoptysis, shortness of breath and difficulty breathing.   Cardiovascular: Positive for swelling in legs/feet. Negative for chest pain, palpitations and hypertension.  Gastrointestinal: Negative for blood in stool, constipation and diarrhea.  Endocrine: Negative for increased urination.  Genitourinary: Negative for painful urination.  Musculoskeletal: Positive for arthralgias, joint pain, joint swelling, muscle weakness, morning stiffness and muscle tenderness. Negative for myalgias and myalgias.  Skin: Positive for rash and ulcers. Negative for color change, pallor, hair loss, nodules/bumps, skin tightness and sensitivity to sunlight.  Allergic/Immunologic: Positive for susceptible to infections.  Neurological: Negative for dizziness and headaches.  Hematological: Negative for bruising/bleeding tendency and swollen glands.  Psychiatric/Behavioral: Positive for sleep disturbance. Negative for depressed mood. The patient is not nervous/anxious.     PMFS History:  Patient Active Problem List   Diagnosis Date Noted  . Bilateral carpal tunnel syndrome 07/21/2019  . Costochondritis  03/16/2019  . Rib pain on right side 03/16/2019  . Insulin resistance 11/27/2018  . Class 3 severe obesity with serious comorbidity and body mass index (BMI) of 40.0 to 44.9 in adult [facility]) 11/27/2018  . Fibromuscular dysplasia ([place]) 10/17/2018  . Hypertension  10/17/2018  . Bronchiectasis with (acute) exacerbation ([place]) 10/08/2018  . OSA (obstructive sleep apnea) 08/20/2018  . Rheumatoid arthritis ([place]) 04/17/2018  . ILD (interstitial lung disease) ([place]) 04/17/2018  . Dyspnea 04/01/2018  . Bronchiectasis ([place]) 09/02/2017  . LVH (left ventricular hypertrophy) 06/02/2017  . Mitral regurgitation 05/22/2017  . Jo-1 antibody positive 01/29/2017  . Primary osteoarthritis of both feet 01/29/2017  . Primary osteoarthritis of both hands 01/29/2017  . Left shoulder tendonitis 01/17/2017  . Vitamin D deficiency 04/26/2016  . Cough 07/18/2015  . MVP (mitral valve prolapse)   . Erythema nodosum   . INCONTINENCE, URGE 05/26/2010  . Migraine headache 02/15/2010  . Fibromyalgia 06/20/2009  . Polymyositis ([place]) 02/11/2008  . BRONCHIECTASIS 02/10/2008    Past Medical History:  Diagnosis Date  . Abnormal liver function test   . Bronchiectasis       . Dyspnea   . Fibromuscular dysplasia ([place])   . Fibromyalgia   . Heart murmur   . ILD (interstitial lung disease) ([place])   . Joint pain   . Menorrhagia   . MIGRAINE HEADACHE   . MVP (mitral valve prolapse)   . Osteoarthritis   . Polymyositis ([place])    Dr [name]; chronic MTX  . Pulmonary fibrosis ([place])   . Rheumatoid arthritis ([place])   . Sleep apnea   . Vitamin B 12 deficiency   . Vitamin D deficiency     Family History  Problem Relation Age of Onset  . Diabetes Mother   . Fibromyalgia Mother   . Ulcers Mother   . Heart failure Mother   . Thyroid disease Mother   . Obesity Mother   . Multiple myeloma Father   . Hypertension Father   . Stroke Father   . Lupus Sister   . Other Sister        abdominal adhesions resulting in bowel obstruction  . Migraines Sister   . Heart disease Brother        bypass surgery  . Rheum arthritis Sister   . Multiple myeloma Sister   . Hypertension Sister   . Heart attack Sister   . Diabetes Sister   . Hypertension Sister   . Rheum arthritis  Sister   . Diabetes Sister   . Diabetes Brother   . Headache Other        siblings with headaches but not diagnosed as migraines   Past Surgical History:  Procedure Laterality Date  . ablation uterine  2010  . CARDIAC CATHETERIZATION  2002   normal  . DILATION AND CURETTAGE OF UTERUS  08-31-08   Dr [name]  . IR ANGIO INTRA EXTRACRAN SEL COM CAROTID INNOMINATE BILAT MOD SED  09/18/2018  . IR ANGIO VERTEBRAL SEL VERTEBRAL BILAT MOD SED  09/18/2018  . IR US GUIDE VASC ACCESS RIGHT  09/18/2018   Social History   Social History Narrative   Exercise: trying to walk - limited by fatigue   Lives at home with husband    Right handed   Caffeine: occasional coke 1-2x per month   Immunization History  Administered Date(s) Administered  . Influenza Split 04/10/2011, 06/18/2012, 05/06/2013  . Influenza Whole 05/19/2008, 05/15/2009, 06/01/2010  . Influenza,inj,Quad PF,6+ Mos 04/20/2014, 05/26/2015,  05/31/2016, 04/05/2017, 06/16/2018, 04/22/2019  . Pneumococcal Conjugate-13 01/29/2017  . Pneumococcal Polysaccharide-23 08/07/2007, 06/16/2018  . Td 07/12/2009     Objective: Vital Signs: BP 120/72 (BP Location: Left Arm, Patient Position: Sitting, Cuff Size: Normal)   Pulse 75   Resp 14   Ht _0  (1.727 m)   Wt 265 lb (120.2 kg)   BMI 40.29 kg/m    Physical Exam Vitals and nursing note reviewed.  Constitutional:      Appearance: She is well-developed.  HENT:     Head: Normocephalic and atraumatic.  Eyes:     Conjunctiva/sclera: Conjunctivae normal.  Pulmonary:     Effort: Pulmonary effort is normal.  Abdominal:     General: Bowel sounds are normal.     Palpations: Abdomen is soft.  Musculoskeletal:     Cervical back: Normal range of motion.  Lymphadenopathy:     Cervical: No cervical adenopathy.  Skin:    General: Skin is warm and dry.     Capillary Refill: Capillary refill takes less than 2 seconds.  Neurological:     Mental Status: She is alert and oriented to  person, place, and time.  Psychiatric:        Behavior: Behavior normal.      Musculoskeletal Exam: C-spine, thoracic spine, lumbar spine good range of motion.  No midline spinal tenderness.  Shoulder joints have good range of motion with discomfort bilaterally.  Tenderness over bilateral AC joints.  Elbow joints have good range of motion with no tenderness or synovitis.  Wrist joints have full range of motion with no tenderness or synovitis.  She has incomplete fist formation bilaterally.  Dactylitis of the left fifth digit noted.  She has tenderness and synovitis of the right first PIP joint.  Hip joints have good range of motion with no discomfort.  Knee joints have good range of motion with no warmth or effusion.  Ankle joints have good range of motion with no tenderness or synovitis.  No tenderness of MTP joints.  CDAI Exam: CDAI Score: 8.1  Patient Global: 6 mm; Provider Global: 5 mm Swollen: 4 ; Tender: 5  Joint Exam 11/04/2019      Right  Left  MCP 1   Tender     MCP 5     Swollen Tender  IP  Swollen Tender     PIP 5     Swollen Tender  DIP 5     Swollen Tender     Investigation: No additional findings.  Imaging: XR Shoulder Left  Result Date: 11/04/2019 Mild acromioclavicular narrowing was noted.  No glenohumeral joint space narrowing was noted.  No chondrocalcinosis was noted. Impression: These findings are consistent with mild acromioclavicular arthritis.  XR Shoulder Right  Result Date: 11/04/2019 No glenohumeral joint space narrowing was noted.  Mild acromioclavicular narrowing was noted. Impression: These findings are consistent with mild acromioclavicular arthritis   Recent Labs: Lab Results  Component Value Date   WBC 6.5 10/12/2019   HGB 14.2 10/12/2019   PLT 292 10/12/2019   NA 141 10/12/2019   K 4.5 10/12/2019   CL 106 10/12/2019   CO2 21 10/12/2019   GLUCOSE 91 10/12/2019   BUN 11 10/12/2019   CREATININE 0.56 (L) 10/12/2019   BILITOT 0.3 10/12/2019    ALKPHOS 76 10/12/2019   AST 17 10/12/2019   ALT 11 10/12/2019   PROT 7.7 10/12/2019   ALBUMIN 4.6 10/12/2019   CALCIUM 9.9 10/12/2019   GFRAA 123 10/12/2019  Speciality Comments: Prior therapy includes: MTX (d/c due to ILD progresssion)  Procedures:  No procedures performed Allergies: Sulfonamide derivatives   Assessment / Plan:     Visit Diagnoses: Rheumatoid arthritis involving multiple sites with positive rheumatoid factor (HCC) - +RF, +CCP: She has synovitis of the right first PIP and dactylitis of the left fifth digit.  She has been experiencing increased pain and inflammation in both hands for the past 1 week.  She attributes this flare to having an increased immune response to the COVID-19 vaccination last week.  Overall she is clinically been doing well on Imuran 100 mg by mouth daily.  She has not missed any doses of Imuran recently.  We will send in a prednisone taper starting at 10 mg tapering by 2.5 mg every 4 days.  A refill Voltaren gel will also be sent to the pharmacy.  She will continue taking Imuran as prescribed.  She does not need any refills at this time.  She was advised to notify us if she continues to have recurrent flares.  She will follow-up in the office in 3 months.  Polymyositis ([place]) - Dx by Dr. Charlestine Night 2007, CK 801, Jo-1 positive: She has not had any signs or symptoms of a polymyositis flare recently.  She experiences occasional myalgias and mild muscle weakness but is unsure if it is due to her fibromyalgia or polymyositis.  She has full strength of upper and lower extremities on exam today.  She had no difficulty getting up from a seated position or raising her arms above her head.  Her CK was 42 on 08/25/2019 which has been stable.  She will continue taking Imuran 100 mg daily.  She was advised to notify us if she develops increased muscle weakness.  High risk medication use -  Imuran 100 mg po daily.  Discontinue methotrexate due to further progression of  ILD.  CBC and CMP were drawn on 10/12/2019.  She will be due to update lab work in June and every 3 months. Standing orders are in place.   Jo-1 antibody positive  ILD (interstitial lung disease) ([place]): She has been evaluated by [name] in the past.  She had a chest x-ray on 03/18/2019 which revealed stable fibrosis with scarring focally in the left base.  No consolidation or edema was noted.  She has been following up with Dr. Quay Burow on a regular basis.  She will continue taking Imuran as prescribed.  Bronchiectasis without complication ([place]): She has not had any recent flares.  She continues to follow-up with Dr. Quay Burow.  Bilateral carpal tunnel syndrome - She had evidence of moderate to severe carpal tunnel syndrome bilaterally on NCV with EMG performed by Dr. Jaynee Eagles on 07/21/19. She was evaluated by Dr. Fredna Dow in the past but was not ready to proceed with carpal tunnel release at that time since she was not experiencing any symptoms.  She was advised to follow-up with Dr. Fredna Dow if she develops new or worsening symptoms.  Primary osteoarthritis of both hands: She has PIP and DIP thickening consistent with osteoarthritis of both hands.  She is experiencing increased discomfort and stiffness in both hands.  She has incomplete fist formation bilaterally.  Joint protection and muscle strengthening were discussed.  Primary osteoarthritis of both feet: She is not experiencing any discomfort in her feet at this time.  Ankle joints have good range of motion with no tenderness or synovitis.  No tenderness of MTP joints.  Fibromyalgia: She experiences intermittent muscle tenderness  and myalgias due to fibromyalgia.  She is having trapezius muscle tension and muscle tenderness bilaterally.  She has positive tender points on exam today.  She continues to have chronic fatigue which was exacerbated by receiving the first covid-19 vaccine.    Chronic pain of both shoulders -She has been experiencing increased pain in  both shoulder joints.  She has tenderness over bilateral AC joints.  She has good range of motion of both shoulder joints on exam today.  X-rays of both shoulders were obtained which revealed mild arthritis in the bilateral AC joints.  She requested a refill for Voltaren gel which she can apply topically as needed for pain relief.  Plan: XR Shoulder Right, XR Shoulder Left  Other fatigue: She has been experiencing increased fatigue after receiving the first COVID-19 vaccination.  Fibromuscular dysplasia [facility]): She continues to take aspirin 81 mg by mouth daily and atenolol as prescribed.   Vitamin D deficiency: She is taking a vitamin D supplement.   Other medical conditions are listed as follows:   History of migraine  Erythema nodosum  History of mitral valve prolapse    Orders: Orders Placed This Encounter  Procedures  . XR Shoulder Right  . XR Shoulder Left   Meds ordered this encounter  Medications  . predniSONE (DELTASONE) 5 MG tablet    Sig: Take 2 tablets by mouth daily x4 days, 1.5 tablets by mouth daily x4 days, 1 tablet by mouth daily x4 days, half tablet by mouth daily x4 days.    Dispense:  20 tablet    Refill:  0  . diclofenac Sodium (VOLTAREN) 1 % GEL    Sig: Apply 2-4 grams to affected joint 4 times daily as needed.    Dispense:  400 g    Refill:  2    Face-to-face time spent with patient was 30 minutes. Greater than 50% of time was spent in counseling and coordination of care.  Follow-Up Instructions: Return in about 3 months (around 02/03/2020) for Rheumatoid arthritis, Polymyositis.   Ofilia Neas, PA-C   I examined and evaluated the patient with Hazel Sams PA.  Patient was having a flare of rheumatoid arthritis with increased pain and swelling in multiple joints as described above.  X-ray of bilateral shoulder joints were unremarkable.  She is given a short prednisone taper.  She believes the flare is related to the recent Covid vaccine.  The plan of  care was discussed as noted above.  Bo Merino, MD  Note - This record has been created using Editor, commissioning.  Chart creation errors have been sought, but may not always  have been located. Such creation errors do not reflect on  the standard of medical care.

## 2019-10-28 NOTE — Telephone Encounter (Signed)
Please recommend supportive care for symptomatic relief including rest, hydration, and tylenol as needed for fevers and myalgias. Please advise patient to monitor her temperature after receiving the vaccine.     It is not recommended to take NSAIDs or prednisone prior to receiving the vaccination.

## 2019-11-02 MED ORDER — VITAMIN D (ERGOCALCIFEROL) 1.25 MG (50000 UNIT) PO CAPS: 50000.0000 [IU] | ORAL_CAPSULE | ORAL | 0 refills | Status: DC

## 2019-11-02 NOTE — Progress Notes (Signed)
TeleHealth Visit:  Due to the COVID-19 pandemic, this visit was completed with telemedicine (audio/video) technology to reduce patient and provider exposure as well as to preserve personal protective equipment.   Jill Harper has verbally consented to this TeleHealth visit. The patient is located at home, the provider is located at the [facility] office. The participants in this visit include the listed provider and patient. The visit was conducted today via telephone.  Jill Harper was unable to use realtime audiovisual technology today and the telehealth visit was conducted via telephone.  Chief Complaint: OBESITY Jill Harper is here to discuss her progress with her obesity treatment plan along with follow-up of her obesity related diagnoses. Jill Harper is on the Category 2 Plan and states she is following her eating plan approximately 98% of the time. Jill Harper states she is exercising for 0 minutes 0 times per week.  Today's visit was #: 22 Starting weight: 273 lbs Starting date: 10/09/2018  Interim History: Jill Harper had her COVID vaccine yesterday.  She reports being fatigued and achy.  Of note:  She has a history of RA and is on Imuran.  Subjective:   1. Prediabetes Jill Harper has a diagnosis of prediabetes based on her elevated HgA1c and was informed this puts her at greater risk of developing diabetes. She continues to work on diet and exercise to decrease her risk of diabetes. She denies nausea or hypoglycemia.  She is taking metformin 500 mg twice daily.  Lab Results  Component Value Date   HGBA1C 5.5 10/12/2019   Lab Results  Component Value Date   INSULIN 15.8 10/12/2019   INSULIN 22.4 10/09/2018   2. Vitamin D deficiency Jill Harper's Vitamin D level was 27.5 on 10/12/2019. She is currently taking OTC vitamin D 2000 each day. She denies nausea, vomiting or muscle weakness.  3. OSA (obstructive sleep apnea) Jill Harper has a diagnosis of sleep apnea. She reports that she is using a CPAP regularly.   4.  Essential hypertension Review: taking medications as instructed, no medication side effects noted, no chest pain on exertion, no dyspnea on exertion, no swelling of ankles.   BP Readings from Last 3 Encounters:  10/20/19 118/72  10/12/19 110/70  09/23/19 120/73   Assessment/Plan:   1. Prediabetes Jill Harper will continue to work on weight loss, exercise, and decreasing simple carbohydrates to help decrease the risk of diabetes.   2. Vitamin D deficiency Low Vitamin D level contributes to fatigue and are associated with obesity, breast, and colon cancer. She agrees to continue to take prescription Vitamin D @50 ,000 IU every week and will follow-up for routine testing of Vitamin D, at least 2-3 times per year to avoid over-replacement.  3. OSA (obstructive sleep apnea) Intensive lifestyle modifications are the first line treatment for this issue. We discussed several lifestyle modifications today and she will continue to work on diet, exercise and weight loss efforts. We will continue to monitor. Orders and follow up as documented in patient record.   Counseling  Sleep apnea is a condition in which breathing pauses or becomes shallow during sleep. This happens over and over during the night. This disrupts your sleep and keeps your body from getting the rest that it needs, which can cause tiredness and lack of energy (fatigue) during the day.  Sleep apnea treatment: If you were given a device to open your airway while you sleep, USE IT!  Sleep hygiene:   Limit or avoid alcohol, caffeinated beverages, and cigarettes, especially close to bedtime.  Do not eat a large meal or eat spicy foods right before bedtime. This can lead to digestive discomfort that can make it hard for you to sleep.  Keep a sleep diary to help you and your health care provider figure out what could be causing your insomnia.  . Make your bedroom a dark, comfortable place where it is easy to fall asleep. ? Put up shades or  blackout curtains to block light from outside. ? Use a white noise machine to block noise. ? Keep the temperature cool. . Limit screen use before bedtime. This includes: ? Watching TV. ? Using your smartphone, tablet, or computer. . Stick to a routine that includes going to bed and waking up at the same times every day and night. This can help you fall asleep faster. Consider making a quiet activity, such as reading, part of your nighttime routine. . Try to avoid taking naps during the day so that you sleep better at night. . Get out of bed if you are still awake after 15 minutes of trying to sleep. Keep the lights down, but try reading or doing a quiet activity. When you feel sleepy, go back to bed.  4. Essential hypertension Jill Harper is working on healthy weight loss and exercise to improve blood pressure control. We will watch for signs of hypotension as she continues her lifestyle modifications.  5. Class 2 severe obesity with serious comorbidity and body mass index (BMI) of 39.0 to 39.9 in adult, unspecified obesity type (HCC) Jill Harper is currently in the action stage of change. As such, her goal is to continue with weight loss efforts. She has agreed to keeping a food journal and adhering to recommended goals of 1200 calories and 85+ protein.   Exercise goals: [employer]itness[employer] (send info to MyChart for calories/protein for Category 2)  Behavioral modification strategies: increasing lean protein intake and increasing water intake.  Jill Harper has agreed to call back for an appointment. She was informed of the importance of frequent follow-up visits to maximize her success with intensive lifestyle modifications for her multiple health conditions.  Objective:   VITALS: Per patient if applicable, see vitals. GENERAL: Alert and in no acute distress. CARDIOPULMONARY: No increased WOB. Speaking in clear sentences.  PSYCH: Pleasant and cooperative. Speech normal rate and rhythm. Affect is appropriate. Insight  and judgement are appropriate. Attention is focused, linear, and appropriate.  NEURO: Oriented as arrived to appointment on time with no prompting.   Lab Results  Component Value Date   CREATININE 0.56 (L) 10/12/2019   BUN 11 10/12/2019   NA 141 10/12/2019   K 4.5 10/12/2019   CL 106 10/12/2019   CO2 21 10/12/2019   Lab Results  Component Value Date   ALT 11 10/12/2019   AST 17 10/12/2019   ALKPHOS 76 10/12/2019   BILITOT 0.3 10/12/2019   Lab Results  Component Value Date   HGBA1C 5.5 10/12/2019   HGBA1C 5.5 10/09/2018   HGBA1C 4.8 04/24/2016   Lab Results  Component Value Date   INSULIN 15.8 10/12/2019   INSULIN 22.4 10/09/2018   Lab Results  Component Value Date   TSH 4.040 10/12/2019   Lab Results  Component Value Date   CHOL 154 10/12/2019   HDL 61 10/12/2019   LDLCALC 83 10/12/2019   TRIG 43 10/12/2019   CHOLHDL 2.5 10/12/2019   Lab Results  Component Value Date   WBC 6.5 10/12/2019   HGB 14.2 10/12/2019   HCT 41.9 10/12/2019   MCV  86 10/12/2019   PLT 292 10/12/2019   Lab Results  Component Value Date   IRON 68 10/12/2019   TIBC 389 10/12/2019   FERRITIN 66 10/12/2019    Attestation Statements:   Reviewed by clinician on day of visit: allergies, medications, problem list, medical history, surgical history, family history, social history, and previous encounter notes.  I, Water quality scientist, CMA, am acting as Location manager for [employer], DO.  I have reviewed the above documentation for accuracy and completeness, and I agree with the above. Briscoe Deutscher, DO

## 2019-11-04 ENCOUNTER — Ambulatory Visit: Payer: Federal, State, Local not specified - PPO | Admitting: Physician Assistant

## 2019-11-04 ENCOUNTER — Ambulatory Visit: Payer: Self-pay

## 2019-11-04 ENCOUNTER — Other Ambulatory Visit: Payer: Self-pay

## 2019-11-04 ENCOUNTER — Encounter: Payer: Self-pay | Admitting: Physician Assistant

## 2019-11-04 VITALS — BP 120/72 | HR 75 | Resp 14 | Ht 68.0 in | Wt 265.0 lb

## 2019-11-04 DIAGNOSIS — M0579 Rheumatoid arthritis with rheumatoid factor of multiple sites without organ or systems involvement: Secondary | ICD-10-CM

## 2019-11-04 DIAGNOSIS — G8929 Other chronic pain: Secondary | ICD-10-CM

## 2019-11-04 DIAGNOSIS — R768 Other specified abnormal immunological findings in serum: Secondary | ICD-10-CM

## 2019-11-04 DIAGNOSIS — M797 Fibromyalgia: Secondary | ICD-10-CM

## 2019-11-04 DIAGNOSIS — M25511 Pain in right shoulder: Secondary | ICD-10-CM

## 2019-11-04 DIAGNOSIS — M3320 Polymyositis, organ involvement unspecified: Secondary | ICD-10-CM

## 2019-11-04 DIAGNOSIS — Z8679 Personal history of other diseases of the circulatory system: Secondary | ICD-10-CM

## 2019-11-04 DIAGNOSIS — Z8669 Personal history of other diseases of the nervous system and sense organs: Secondary | ICD-10-CM

## 2019-11-04 DIAGNOSIS — G5603 Carpal tunnel syndrome, bilateral upper limbs: Secondary | ICD-10-CM

## 2019-11-04 DIAGNOSIS — M19072 Primary osteoarthritis, left ankle and foot: Secondary | ICD-10-CM

## 2019-11-04 DIAGNOSIS — R7689 Other specified abnormal immunological findings in serum: Secondary | ICD-10-CM

## 2019-11-04 DIAGNOSIS — Z79899 Other long term (current) drug therapy: Secondary | ICD-10-CM

## 2019-11-04 DIAGNOSIS — J479 Bronchiectasis, uncomplicated: Secondary | ICD-10-CM

## 2019-11-04 DIAGNOSIS — M19041 Primary osteoarthritis, right hand: Secondary | ICD-10-CM

## 2019-11-04 DIAGNOSIS — M25512 Pain in left shoulder: Secondary | ICD-10-CM

## 2019-11-04 DIAGNOSIS — M19042 Primary osteoarthritis, left hand: Secondary | ICD-10-CM

## 2019-11-04 DIAGNOSIS — L52 Erythema nodosum: Secondary | ICD-10-CM

## 2019-11-04 DIAGNOSIS — I773 Arterial fibromuscular dysplasia: Secondary | ICD-10-CM

## 2019-11-04 DIAGNOSIS — M19071 Primary osteoarthritis, right ankle and foot: Secondary | ICD-10-CM

## 2019-11-04 DIAGNOSIS — J849 Interstitial pulmonary disease, unspecified: Secondary | ICD-10-CM

## 2019-11-04 DIAGNOSIS — E559 Vitamin D deficiency, unspecified: Secondary | ICD-10-CM

## 2019-11-04 DIAGNOSIS — R5383 Other fatigue: Secondary | ICD-10-CM

## 2019-11-04 MED ORDER — PREDNISONE 5 MG PO TABS: ORAL_TABLET | ORAL | 0 refills | Status: DC

## 2019-11-04 MED ORDER — DICLOFENAC SODIUM 1 % EX GEL: CUTANEOUS | 2 refills | Status: DC

## 2019-11-09 NOTE — Telephone Encounter (Signed)
She is taking a very low dose prednisone taper, so it is ok to proceed with second covid-19 vaccine.

## 2019-11-25 ENCOUNTER — Telehealth: Payer: Self-pay | Admitting: *Deleted

## 2019-11-25 NOTE — Telephone Encounter (Signed)
We received a request from [facility] specialty case management asking for pertinent clinical info, etc. Pt enrolled in this program. I called pt see if she is ok with [name] providing note from last office visit. Left office number in message and asked for call back or a mychart message with response.

## 2019-11-27 DIAGNOSIS — G4733 Obstructive sleep apnea (adult) (pediatric): Secondary | ICD-10-CM | POA: Diagnosis not present

## 2019-11-27 DIAGNOSIS — J471 Bronchiectasis with (acute) exacerbation: Secondary | ICD-10-CM | POA: Diagnosis not present

## 2019-12-07 DIAGNOSIS — G5622 Lesion of ulnar nerve, left upper limb: Secondary | ICD-10-CM | POA: Diagnosis not present

## 2019-12-07 DIAGNOSIS — G5601 Carpal tunnel syndrome, right upper limb: Secondary | ICD-10-CM | POA: Diagnosis not present

## 2019-12-17 ENCOUNTER — Encounter: Payer: Self-pay | Admitting: Pulmonary Disease

## 2019-12-17 ENCOUNTER — Ambulatory Visit (INDEPENDENT_AMBULATORY_CARE_PROVIDER_SITE_OTHER): Payer: Federal, State, Local not specified - PPO

## 2019-12-17 ENCOUNTER — Ambulatory Visit: Payer: Federal, State, Local not specified - PPO | Admitting: Pulmonary Disease

## 2019-12-17 ENCOUNTER — Other Ambulatory Visit: Payer: Self-pay

## 2019-12-17 VITALS — BP 124/80 | HR 67 | Temp 98.4°F | Ht 68.0 in | Wt 269.8 lb

## 2019-12-17 DIAGNOSIS — J849 Interstitial pulmonary disease, unspecified: Secondary | ICD-10-CM

## 2019-12-17 DIAGNOSIS — R0600 Dyspnea, unspecified: Secondary | ICD-10-CM | POA: Diagnosis not present

## 2019-12-17 DIAGNOSIS — R05 Cough: Secondary | ICD-10-CM | POA: Diagnosis not present

## 2019-12-17 DIAGNOSIS — R0609 Other forms of dyspnea: Secondary | ICD-10-CM

## 2019-12-17 DIAGNOSIS — J479 Bronchiectasis, uncomplicated: Secondary | ICD-10-CM

## 2019-12-17 DIAGNOSIS — M069 Rheumatoid arthritis, unspecified: Secondary | ICD-10-CM

## 2019-12-17 DIAGNOSIS — M3320 Polymyositis, organ involvement unspecified: Secondary | ICD-10-CM

## 2019-12-17 DIAGNOSIS — G4733 Obstructive sleep apnea (adult) (pediatric): Secondary | ICD-10-CM

## 2019-12-17 MED ORDER — PREDNISONE 10 MG PO TABS: ORAL_TABLET | ORAL | 0 refills | Status: DC

## 2019-12-17 MED ORDER — BENZONATATE 200 MG PO CAPS: 200.0000 mg | ORAL_CAPSULE | Freq: Three times a day (TID) | ORAL | 1 refills | Status: DC | PRN

## 2019-12-17 NOTE — Progress Notes (Signed)
@Patient  ID: Jill Harper, female    DOB: [birth year], 54 y.o.   MRN: [id number]  Chief Complaint  Patient presents with  . Follow-up    States that she has received both of her covid vaccines. Both vaccines have caused bronchiectasis and RA flares. Uses O2 at night but had to use O2 during the day a few days. Increased headaches.     Referring provider: Binnie Rail, MD  HPI:  54 year old female followed in our office for bronchiectasis in the setting of rheumatoid arthritis and polymyositis  PMH: Rheumatoid arthritis, polymyositis, maintained on Imuran per rheumatology Smoker/ Smoking History: Never smoker Maintenance: Breo Ellipta 100 Pt of: [name]  12/17/2019  - Visit   54 year old female never smoker followed in our office for bronchiectasis and interstitial lung disease.  She is also followed by Dr. Estanislado Pandy for rheumatoid arthritis as well as polymyositis.  She was also found to have mild obstructive sleep apnea, she is maintained on CPAP therapy.  CPAP compliance report shows excellent compliance as well as a well-controlled AHI of 1.4.   Patient presenting to office today because she is noticing that she has had some worsened breathing over the past couple of months.  She specifically noticed worsened symptoms when receiving the Covid vaccines.  She reports that in March when she received the first Covid vaccine she had significant myalgias with flare of her rheumatoid arthritis and polymyositis.  She was treated with a prednisone taper by rheumatology.  She was still on the prednisone taper when she received her second dose of the Covid vaccine.  After the second is the Covid vaccine she felt she had a flare of bronchiectasis with increased cough congestion.  Even prior to receiving the Covid vaccine she admits that she feels like she has had worsened dyspnea on exertion as well as difficulty taking a deep breath in.  Patient continues now to have a dry cough.  It is not as  productive.  She denies fevers.  Patient reports adherence to her Brio Ellipta 100.  Questionaires / Pulmonary Flowsheets:   MMRC: mMRC Dyspnea Scale mMRC Score  12/17/2019 1    Epworth:  Results of the Epworth flowsheet 06/16/2018  Sitting and reading 0  Watching TV 1  Sitting, inactive in a public place (e.g. a theatre or a meeting) 0  As a passenger in a car for an hour without a break 2  Lying down to rest in the afternoon when circumstances permit 3  Sitting and talking to someone 0  Sitting quietly after a lunch without alcohol 1  In a car, while stopped for a few minutes in traffic 0  Total score 7    Tests:   Pulmonary tests:  PFT 10/30/11 >> FEV1 3.31 (114%), FEV1% 85, TLC 6.06 (108%), DLCO 77%, no BD PFT 11/18/12 >> FEV1 3.18 (111%), FEV1% 84, TLC 5.34 (95%), DLCO 67%, no BD PFT 12/02/15 >> FEV1 2.96 (108%), FEV1% 92, TLC 4.95 (86%), DLCO 65%, no BD Serology 01/31/17 >> ANA negative, RF 29, anti CCP 213, SCL 70 negative, SSA/SSB negative, ENA SM negative, Jo-1 positive Quantiferon gold 01/31/17 >> negative PFT 04/17/18 >> FEV1 2.46 (101%), FEV1% 90, DLCO 76% PFT 02/05/19 >> FEV1 2.30 (87%), FEV1% 89, TLC 4.21 (74%), DLCO 81%, no BD  Chest imaging:  CT chest 03/23/05 >> patchy b/l lower lung ASD with cylindrical BTX CT chest 02/17/08 >> peripheral and basilar predominant subpleural GGO CT chest 12/01/08 >> no change HRCT chest  09/07/15 >> scattered GGO HRCT chest 04/15/18 >> patchy confluent subpleural and peripheral peribronchovascular reticulation and ground-glass opacity throughout both lungs with a strong basilar gradient, traction BTX  Labs:  Serology 01/31/17 >> RF 29, CCP 213, Jo-1 > 8; ANA, SCL 70, SSA/SSB, ENA Sm negative Quantiferon gold 01/31/17 >> negative Ig 01/31/17 >> IgG 1992, IgA 147, IgM 136  Sleep testing:  HST 07/28/18 >> AHI 5, SpO2 low 83% ONO with CPAP 11/05/18 >> test time 6 hrs 24 min, baseline SpO2 90%, low SpO2 80%; spent 1 hr 15 min with SpO2 <  88% Auto CPAP 02/15/19 to 03/16/19 >> used on 30 of 30 nights with average 7 hrs 41 min.  Average AHI 1.2 with median CPAP 6 and 95 th percentile CPAP 8 cm H2O  Cardiac tests:  Echo 05/23/18 >> EF 60 to 65%, grade 1 DD, mild MR   FENO:  Lab Results  Component Value Date   NITRICOXIDE 9 12/11/2017    PFT: PFT Results Latest Ref Rng & Units 02/05/2019 04/17/2018 12/02/2015  FVC-Pre L 2.58 2.74 3.40  FVC-Predicted Pre % 78 90 99  FVC-Post L 2.56 - 3.24  FVC-Predicted Post % 77 - 94  Pre FEV1/FVC % % 89 90 87  Post FEV1/FCV % % 86 - 92  FEV1-Pre L 2.30 2.46 2.97  FEV1-Predicted Pre % 87 101 108  FEV1-Post L 2.21 - 2.96  DLCO UNC% % 81 76 65  DLCO COR %Predicted % 120 98 79  TLC L 4.21 - 4.95  TLC % Predicted % 74 - 86  RV % Predicted % 69 - 76    WALK:  SIX MIN WALK 12/17/2019 10/16/2018 07/28/2018 05/16/2018  Medications - - Breo 100 at 7am -  Supplimental Oxygen during Test? (L/min) No - No No  Laps - - 9 -  Partial Lap (in Meters) - - 0 -  Baseline BP (sitting) - - 118/70 -  Baseline Heartrate - - 83 -  Baseline Dyspnea (Borg Scale) - - 1 -  Baseline Fatigue (Borg Scale) - - 0.5 -  Baseline SPO2 - - 95 -  2 Minute Oxygen Saturation % - 99 - -  2 Minute HR - 114 - -  4 Minute Oxygen Saturation % - 96 - -  4 Minute HR - 115 - -  6 Minute Oxygen Saturation % - 98 - -  6 Minute HR - 114 - -  BP (sitting) - - 130/80 -  Heartrate - - 84 -  Dyspnea (Borg Scale) - - 3 -  Fatigue (Borg Scale) - - 3 -  SPO2 - - 96 -  BP (sitting) - - 122/76 -  Heartrate - - 79 -  SPO2 - - 97 -  Interpretation - - Angina;Dizziness;Leg pain -  Distance Completed - - 306 -  Tech Comments: Patient was able to complete 1 lap at a leisurely pace. During the last part of the walk, she did state that she felt lightheaded and weak but wanted to continue the walk. - pt walked at a normal pace during walk denying any complaints during walk.  pt did occassionally cough during walk pt had to stop after two  laps due to SOB and feeling dizzy.kmw    Imaging: No results found.  Lab Results:  CBC    Component Value Date/Time   WBC 6.5 10/12/2019 1432   WBC 7.3 08/25/2019 1424   RBC 4.87 10/12/2019 1432   RBC 4.57 08/25/2019 1424  HGB 14.2 10/12/2019 1432   HCT 41.9 10/12/2019 1432   PLT 292 10/12/2019 1432   MCV 86 10/12/2019 1432   MCH 29.2 10/12/2019 1432   MCH 28.4 08/25/2019 1424   MCHC 33.9 10/12/2019 1432   MCHC 33.0 08/25/2019 1424   RDW 12.1 10/12/2019 1432   LYMPHSABS 1.3 10/12/2019 1432   MONOABS 666 01/31/2017 1006   EOSABS 0.1 10/12/2019 1432   BASOSABS 0.0 10/12/2019 1432    BMET    Component Value Date/Time   NA 141 10/12/2019 1432   K 4.5 10/12/2019 1432   CL 106 10/12/2019 1432   CO2 21 10/12/2019 1432   GLUCOSE 91 10/12/2019 1432   GLUCOSE 91 08/25/2019 1424   BUN 11 10/12/2019 1432   CREATININE 0.56 (L) 10/12/2019 1432   CREATININE 0.78 08/25/2019 1424   CALCIUM 9.9 10/12/2019 1432   GFRNONAA 107 10/12/2019 1432   GFRNONAA 87 08/25/2019 1424   GFRAA 123 10/12/2019 1432   GFRAA 101 08/25/2019 1424    BNP No results found for: BNP  ProBNP    Component Value Date/Time   PROBNP 58.0 04/18/2009 1157    Specialty Problems      Pulmonary Problems   BRONCHIECTASIS    Dr Halford Chessman  CT sinus 09/07/2015 neg for acute dz.  HRCT 09/07/2015 >Basilar predominant peribronchovascular ground-glass is indicative of bronchopneumonia. . No definitive evidence of underlying interstitial lung disease.            Cough   Bronchiectasis (HCC)   Dyspnea    PFT 12/02/15 >> FEV1 2.96 (108%), FEV1% 92, TLC 4.95 (86%), DLCO 65%, no BD  HRCT chest 09/07/15 >> scattered GGO      ILD (interstitial lung disease) ([place])    HRCT chest 09/07/15 >> scattered GGO 04/15/2018-CT chest high-res- spectrum of findings compatible with basilar predominant fibrotic interstitial lung disease with progression since 2017,  UIP  Pulmonarytests: PFT 10/30/11 >> FEV1 3.31 (114%), FEV1%  85, TLC 6.06 (108%), DLCO 77%, no BD PFT 11/18/12 >> FEV1 3.18 (111%), FEV1% 84, TLC 5.34 (95%), DLCO 67%, no BD PFT 12/02/15 >> FEV1 2.96 (108%), FEV1% 92, TLC 4.95 (86%), DLCO 65%, no BD 04/17/2018-spirometry with DLCO-DLCO 76%  Serology 01/31/17 >> ANA negative, RF 29, anti CCP 213, SCL 70 negative, SSA/SSB negative, ENA SM negative, Jo-1 positive Quantiferon gold 01/31/17 >> negative       OSA (obstructive sleep apnea)    HST 07/28/18 >> AHI 5, SpO2 low 83%      Bronchiectasis with (acute) exacerbation (HCC)      Allergies  Allergen Reactions  . Sulfonamide Derivatives     REACTION: hives    Immunization History  Administered Date(s) Administered  . Influenza Split 04/10/2011, 06/18/2012, 05/06/2013  . Influenza Whole 05/19/2008, 05/15/2009, 06/01/2010  . Influenza,inj,Quad PF,6+ Mos 04/20/2014, 05/26/2015, 05/31/2016, 04/05/2017, 06/16/2018, 04/22/2019  . PFIZER SARS-COV-2 Vaccination 10/27/2019, 11/19/2019  . Pneumococcal Conjugate-13 01/29/2017  . Pneumococcal Polysaccharide-23 08/07/2007, 06/16/2018  . Td 07/12/2009    Past Medical History:  Diagnosis Date  . Abnormal liver function test   . Bronchiectasis       . Dyspnea   . Fibromuscular dysplasia ([place])   . Fibromyalgia   . Heart murmur   . ILD (interstitial lung disease) ([place])   . Joint pain   . Menorrhagia   . MIGRAINE HEADACHE   . MVP (mitral valve prolapse)   . Osteoarthritis   . Polymyositis ([place])    Dr [name]; chronic MTX  . Pulmonary  fibrosis ([place])   . Rheumatoid arthritis ([place])   . Sleep apnea   . Vitamin B 12 deficiency   . Vitamin D deficiency     Tobacco History: Social History   Tobacco Use  Smoking Status Never Smoker  Smokeless Tobacco Never Used  Tobacco Comment   Married, lives with spouse. 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 works at [name] [employer] in [employer] [place] [employer]   Continue to not smoke  Outpatient Encounter Medications as of 12/17/2019  Medication Sig  . albuterol (PROVENTIL HFA) 108 (90 Base) MCG/ACT inhaler Inhale 2 puffs into the lungs 4 (four) times daily as needed. Reported on 09/03/2015  . albuterol (PROVENTIL) (2.5 MG/3ML) 0.083% nebulizer solution Take 3 mLs (2.5 mg total) by nebulization every 6 (six) hours as needed for wheezing or shortness of breath.  [name] aspirin EC 81 MG tablet Take 81 mg by mouth daily.  [name] atenolol (TENORMIN) 25 MG tablet TAKE 1/2 TABLET BY MOUTH DAILY AS NEEDED FOR MITRAL PROLAPSE  . azaTHIOprine (IMURAN) 50 MG tablet TAKE 2 TABLETS(100 MG) BY MOUTH DAILY  . BREO ELLIPTA 100-25 MCG/INH AEPB INHALE 1 PUFF INTO THE LUNGS DAILY  . diclofenac Sodium (VOLTAREN) 1 % GEL Apply 2-4 grams to affected joint 4 times daily as needed.  . eletriptan (RELPAX) 20 MG tablet Take 1 tablet (20 mg total) by mouth as needed for migraine.  . folic acid (FOLVITE) 1 MG tablet TAKE 2 TABLETS(2 MG) BY MOUTH DAILY  . Fremanezumab-vfrm (AJOVY) 225 MG/1.5ML SOAJ Inject 225 mg into the skin every 30 (thirty) days.  [name] guaiFENesin (MUCINEX) 600 MG 12 hr tablet Take 2 tablets (1,200 mg total) by mouth 2 (two) times daily as needed for cough or to loosen phlegm. (Patient taking differently: Take 1,200 mg by mouth 2 (two) times daily. )  . metFORMIN (GLUCOPHAGE) 500 MG tablet TAKE 1 TABLET(500 MG) BY MOUTH TWICE DAILY WITH A MEAL  . Multiple Vitamins-Minerals (MULTIVITAMIN GUMMIES ADULT PO) Take 2 each by mouth daily.  [name] Respiratory Therapy Supplies (FLUTTER) DEVI Use as directed (Patient taking differently: 2 (two) times daily. Use as directed)  . telmisartan (MICARDIS) 20 MG tablet TAKE 1 TABLET(20 MG) BY MOUTH DAILY  . VITAMIN D PO Take by mouth.  . Vitamin D, Ergocalciferol, (DRISDOL) 1.25 MG (50000 UNIT) CAPS capsule Take 1 capsule (50,000 Units total) by mouth every 7 (seven) days.  . [DISCONTINUED] predniSONE (DELTASONE) 5  MG tablet Take 2 tablets by mouth daily x4 days, 1.5 tablets by mouth daily x4 days, 1 tablet by mouth daily x4 days, half tablet by mouth daily x4 days.  . benzonatate (TESSALON) 200 MG capsule Take 1 capsule (200 mg total) by mouth 3 (three) times daily as needed for cough.  . predniSONE (DELTASONE) 10 MG tablet 4 tabs for 2 days, then 3 tabs for 2 days, 2 tabs for 2 days, then 1 tab for 2 days, then stop   No facility-administered encounter medications on file as of 12/17/2019.     Review of Systems  Review of Systems  Constitutional: Positive for fatigue. Negative for activity change and fever.  HENT: Negative for sinus pressure, sinus pain and sore throat.   Respiratory: Positive for cough and shortness of breath. Negative for wheezing.   Cardiovascular: Negative for chest pain and palpitations.  Gastrointestinal: Negative for diarrhea, nausea and vomiting.  Musculoskeletal: Negative  for arthralgias.  Neurological: Negative for dizziness.  Psychiatric/Behavioral: Negative for sleep disturbance. The patient is not nervous/anxious.      Physical Exam  BP 124/80   Pulse 67   Temp 98.4 F (36.9 C) (Temporal)   Ht 5\' 8"  (1.727 m)   Wt 269 lb 12.8 oz (122.4 kg)   SpO2 99% Comment: on RA  BMI 41.02 kg/m   Wt Readings from Last 5 Encounters:  12/17/19 269 lb 12.8 oz (122.4 kg)  11/04/19 265 lb (120.2 kg)  10/20/19 264 lb (119.7 kg)  10/12/19 259 lb (117.5 kg)  09/23/19 261 lb (118.4 kg)    BMI Readings from Last 5 Encounters:  12/17/19 41.02 kg/m  11/04/19 40.29 kg/m  10/20/19 40.14 kg/m  10/12/19 39.38 kg/m  09/23/19 39.68 kg/m     Physical Exam Vitals and nursing note reviewed.  Constitutional:      General: She is not in acute distress.    Appearance: Normal appearance. She is obese.  HENT:     Head: Normocephalic and atraumatic.     Right Ear: External ear normal.     Left Ear: External ear normal.     Nose: Nose normal. No congestion.      Mouth/Throat:     Mouth: Mucous membranes are moist.     Pharynx: Oropharynx is clear.  Eyes:     Pupils: Pupils are equal, round, and reactive to light.  Cardiovascular:     Rate and Rhythm: Normal rate and regular rhythm.     Pulses: Normal pulses.     Heart sounds: Normal heart sounds. No murmur.  Pulmonary:     Effort: Pulmonary effort is normal. No respiratory distress.     Breath sounds: No decreased air movement. Rales (basilar crackles ) present. No decreased breath sounds or wheezing.  Musculoskeletal:     Cervical back: Normal range of motion.  Skin:    General: Skin is warm and dry.     Capillary Refill: Capillary refill takes less than 2 seconds.  Neurological:     General: No focal deficit present.     Mental Status: She is alert and oriented to person, place, and time. Mental status is at baseline.     Gait: Gait normal.  Psychiatric:        Mood and Affect: Mood normal.        Behavior: Behavior normal.        Thought Content: Thought content normal.        Judgment: Judgment normal.       Assessment & Plan:   OSA (obstructive sleep apnea) Plan: Continue CPAP therapy  ILD (interstitial lung disease) ([place]) Plan: Chest x-ray today Walk today in office - stable  pred taper today  Continue follow-up with rheumatology We will order pulmonary function testing   Bronchiectasis ([place]) Assessment: Baseline sputum color light yellow in the morning, and cough is improved throughout the day Patient currently taking Mucinex twice a day as well as using her flutter valve twice a day 10 breaths each time Patient reports improved symptoms since strictly adhering to the regimen that we have outlined for her  Plan: Continue flutter valve as instructed Continue Mucinex daily Remain hydrated Eat whole nutritious meals Follow-up with our office if respiratory symptoms worsen or you have increased sputum production or sputum color change from baseline   Rheumatoid  arthritis ([place]) Plan: Continue follow-up with rheumatology  Polymyositis [facility]) Follow-up with rheumatology Continue Imuran  Dyspnea Plan: Walk today  in office Chest x-ray today Continue Breo Ellipta 100 May need to consider repeat CT imaging We will repeat pulmonary function testing Suspect there may be an aspect of worsened interstitial lung disease    Return in about 2 months (around 02/16/2020), or if symptoms worsen or fail to improve, for Follow up with [name].   Lauraine Rinne, NP 12/17/2019   This appointment required 42 minutes of patient care (this includes precharting, chart review, review of results, face-to-face care, etc.).

## 2019-12-17 NOTE — Assessment & Plan Note (Signed)
Assessment: Baseline sputum color light yellow in the morning, and cough is improved throughout the day Patient currently taking Mucinex twice a day as well as using her flutter valve twice a day 10 breaths each time Patient reports improved symptoms since strictly adhering to the regimen that we have outlined for her  Plan: Continue flutter valve as instructed Continue Mucinex daily Remain hydrated Eat whole nutritious meals Follow-up with our office if respiratory symptoms worsen or you have increased sputum production or sputum color change from baseline

## 2019-12-17 NOTE — Assessment & Plan Note (Signed)
Follow-up with rheumatology Continue Imuran

## 2019-12-17 NOTE — Assessment & Plan Note (Addendum)
Plan: Chest x-ray today Walk today in office - stable  pred taper today  Continue follow-up with rheumatology We will order pulmonary function testing

## 2019-12-17 NOTE — Assessment & Plan Note (Signed)
Plan: Continue CPAP therapy 

## 2019-12-17 NOTE — Patient Instructions (Addendum)
You were seen today by Jill Rinne, NP  for:   1. ILD (interstitial lung disease) (HCC)  - benzonatate (TESSALON) 200 MG capsule; Take 1 capsule (200 mg total) by mouth 3 (three) times daily as needed for cough.  Dispense: 30 capsule; Refill: 1 - Pulmonary function test; Future - DG Chest 2 View; Future  Prednisone 10mg  tablet  >>>4 tabs for 2 days, then 3 tabs for 2 days, 2 tabs for 2 days, then 1 tab for 2 days, then stop >>>take with food  >>>take in the morning    2. Dyspnea on exertion  - Pulmonary function test; Future - DG Chest 2 View; Future  Walk today in office with stable without any oxygen desaturations  We will order pulmonary function testing to further evaluate if your lung functioning has worsened  Breo Ellipta 100 >>> Take 1 puff daily in the morning right when you wake up >>>Rinse your mouth out after use >>>This is a daily maintenance inhaler, NOT a rescue inhaler >>>Contact our office if you are having difficulties affording or obtaining this medication >>>It is important for you to be able to take this daily and not miss any doses     3. OSA (obstructive sleep apnea)  Continue oxygen therapy as prescribed  >>>maintain oxygen saturations greater than 88 percent  >>>if unable to maintain oxygen saturations please contact the office  >>>do not smoke with oxygen  >>>can use nasal saline gel or nasal saline rinses to moisturize nose if oxygen causes dryness  4. Bronchiectasis without complication (HCC)  Bronchiectasis: This is the medical term which indicates that you have damage, dilated airways making you more susceptible to respiratory infection. Use a flutter valve 10 breaths twice a day or 4 to 5 breaths 4-5 times a day to help clear mucus out Let us know if you have cough with change in mucus color or fevers or chills.  At that point you would need an antibiotic. Maintain a healthy nutritious diet, eating whole foods Take your medications as  prescribed    5. Rheumatoid arthritis, involving unspecified site, unspecified whether rheumatoid factor present ([place]) 6. Polymyositis ([place])  Keep follow-up with rheumatology  Continue Imuran   We recommend today:  Orders Placed This Encounter  Procedures  . DG Chest 2 View    Standing Status:   Future    Number of Occurrences:   1    Standing Expiration Date:   02/15/2021    Order Specific Question:   Reason for Exam (SYMPTOM  OR DIAGNOSIS REQUIRED)    Answer:   cough, doe    Order Specific Question:   Preferred imaging location?    Answer:   Internal    Order Specific Question:   Radiology Contrast Protocol - do NOT remove file path    Answer:   [email or web address]  . Pulmonary function test    Standing Status:   Future    Standing Expiration Date:   12/16/2020    Order Specific Question:   Where should this test be performed?    Answer:   [facility]    Order Specific Question:   Full PFT: includes the following: basic spirometry, spirometry pre & post bronchodilator, diffusion capacity (DLCO), lung volumes    Answer:   Full PFT   Orders Placed This Encounter  Procedures  . DG Chest 2 View  . Pulmonary function test   Meds ordered this encounter  Medications  . benzonatate (TESSALON) 200 MG capsule  Sig: Take 1 capsule (200 mg total) by mouth 3 (three) times daily as needed for cough.    Dispense:  30 capsule    Refill:  1  . predniSONE (DELTASONE) 10 MG tablet    Sig: 4 tabs for 2 days, then 3 tabs for 2 days, 2 tabs for 2 days, then 1 tab for 2 days, then stop    Dispense:  20 tablet    Refill:  0    Follow Up:    Return in about 2 months (around 02/16/2020), or if symptoms worsen or fail to improve, for Follow up with [name].   Please do your part to reduce the spread of COVID-19:      Reduce your risk of any infection  and COVID19 by using the similar precautions used for avoiding the common cold or flu:    [name] Wash your hands often with soap and warm water for at least 20 seconds.  If soap and water are not readily available, use an alcohol-based hand sanitizer with at least 60% alcohol.  . If coughing or sneezing, cover your mouth and nose by coughing or sneezing into the elbow areas of your shirt or coat, into a tissue or into your sleeve (not your hands). Langley Gauss A MASK when in public  . Avoid shaking hands with others and consider head nods or verbal greetings only. . Avoid touching your eyes, nose, or mouth with unwashed hands.  . Avoid close contact with people who are sick. . Avoid places or events with large numbers of people in one location, like concerts or sporting events. . If you have some symptoms but not all symptoms, continue to monitor at home and seek medical attention if your symptoms worsen. . If you are having a medical emergency, call 911.   [place] / e-Visit: [email or web address]         [facility]: [facility]: [id number]                   [facility]: [id number]     It is flu season:   >>> Best ways to protect herself from the flu: Receive the yearly flu vaccine, practice good hand hygiene washing with soap and also using hand sanitizer when available, eat a nutritious meals, get adequate rest, hydrate appropriately   Please contact the office if your symptoms worsen or you have concerns that you are not improving.   Thank you for choosing [facility] for your healthcare, and for allowing [name] to partner with you on your healthcare journey. I am thankful to be able to provide care to you today.   [name] FNP-C

## 2019-12-17 NOTE — Assessment & Plan Note (Signed)
Plan: Continue follow-up with rheumatology 

## 2019-12-17 NOTE — Assessment & Plan Note (Signed)
Plan: Walk today in office Chest x-ray today Continue Breo Ellipta 100 May need to consider repeat CT imaging We will repeat pulmonary function testing Suspect there may be an aspect of worsened interstitial lung disease

## 2019-12-18 NOTE — Progress Notes (Signed)
Reviewed and agree with assessment/plan.   Chesley Mires, MD [facility] Pulmonary/Critical Care 12/18/2019, 8:55 AM Pager:  [phone]

## 2019-12-22 ENCOUNTER — Other Ambulatory Visit: Payer: Self-pay

## 2019-12-22 MED ORDER — BREO ELLIPTA 100-25 MCG/INH IN AEPB: INHALATION_SPRAY | RESPIRATORY_TRACT | 5 refills | Status: DC

## 2019-12-24 ENCOUNTER — Other Ambulatory Visit: Payer: Self-pay | Admitting: *Deleted

## 2019-12-24 NOTE — Progress Notes (Signed)
Detailed message left on VM regarding results per [name].  Advised to call the office for any questions.

## 2019-12-25 ENCOUNTER — Other Ambulatory Visit: Payer: Self-pay

## 2019-12-25 DIAGNOSIS — Z79899 Other long term (current) drug therapy: Secondary | ICD-10-CM

## 2019-12-25 DIAGNOSIS — M3320 Polymyositis, organ involvement unspecified: Secondary | ICD-10-CM

## 2019-12-26 LAB — CBC WITH DIFFERENTIAL/PLATELET
Absolute Monocytes: 430 cells/uL (ref 200–950)
Basophils Absolute: 26 cells/uL (ref 0–200)
Basophils Relative: 0.3 %
Eosinophils Absolute: 0 cells/uL — ABNORMAL LOW (ref 15–500)
Eosinophils Relative: 0 %
HCT: 42.4 % (ref 35.0–45.0)
Hemoglobin: 13.7 g/dL (ref 11.7–15.5)
Lymphs Abs: 525 cells/uL — ABNORMAL LOW (ref 850–3900)
MCH: 28.1 pg (ref 27.0–33.0)
MCHC: 32.3 g/dL (ref 32.0–36.0)
MCV: 86.9 fL (ref 80.0–100.0)
MPV: 10.8 fL (ref 7.5–12.5)
Monocytes Relative: 5 %
Neutro Abs: 7620 cells/uL (ref 1500–7800)
Neutrophils Relative %: 88.6 %
Platelets: 308 10*3/uL (ref 140–400)
RBC: 4.88 10*6/uL (ref 3.80–5.10)
RDW: 12.8 % (ref 11.0–15.0)
Total Lymphocyte: 6.1 %
WBC: 8.6 10*3/uL (ref 3.8–10.8)

## 2019-12-26 LAB — COMPLETE METABOLIC PANEL WITH GFR
AG Ratio: 1.3 (calc) (ref 1.0–2.5)
ALT: 13 U/L (ref 6–29)
AST: 12 U/L (ref 10–35)
Albumin: 4.3 g/dL (ref 3.6–5.1)
Alkaline phosphatase (APISO): 64 U/L (ref 37–153)
BUN: 15 mg/dL (ref 7–25)
CO2: 27 mmol/L (ref 20–32)
Calcium: 9.7 mg/dL (ref 8.6–10.4)
Chloride: 108 mmol/L (ref 98–110)
Creat: 0.66 mg/dL (ref 0.50–1.05)
GFR, Est African American: 116 mL/min/{1.73_m2} (ref >=60)
GFR, Est Non African American: 100 mL/min/{1.73_m2} (ref >=60)
Globulin: 3.3 g/dL (calc) (ref 1.9–3.7)
Glucose, Bld: 122 mg/dL — ABNORMAL HIGH (ref 65–99)
Potassium: 4.3 mmol/L (ref 3.5–5.3)
Sodium: 141 mmol/L (ref 135–146)
Total Bilirubin: 0.3 mg/dL (ref 0.2–1.2)
Total Protein: 7.6 g/dL (ref 6.1–8.1)

## 2019-12-26 LAB — CK: Total CK: 48 U/L (ref 29–143)

## 2019-12-27 DIAGNOSIS — G4733 Obstructive sleep apnea (adult) (pediatric): Secondary | ICD-10-CM | POA: Diagnosis not present

## 2019-12-27 DIAGNOSIS — J471 Bronchiectasis with (acute) exacerbation: Secondary | ICD-10-CM | POA: Diagnosis not present

## 2019-12-27 NOTE — Progress Notes (Signed)
CK is normal. CMP is normal except mildly elevated glucose. Most likely sample was taken non-fasting. Lymphocyte count is low. We will repeat CBC at the fu visit.

## 2019-12-29 ENCOUNTER — Other Ambulatory Visit: Payer: Self-pay | Admitting: *Deleted

## 2019-12-29 MED ORDER — AZATHIOPRINE 50 MG PO TABS: ORAL_TABLET | ORAL | 2 refills | Status: DC

## 2019-12-29 NOTE — Telephone Encounter (Signed)
Refill request received via fax  Last Visit: 11/04/2019 Next Visit: 02/03/2020 Labs: 12/25/2019 CMP is normal except mildly elevated glucose. Lymphocyte count is low.   Current Dose per office note 11/04/2019: Imuran 100 mg by mouth daily  Okay to refill per Dr. Estanislado Pandy

## 2019-12-30 ENCOUNTER — Other Ambulatory Visit: Payer: Self-pay | Admitting: *Deleted

## 2019-12-30 MED ORDER — FOLIC ACID 1 MG PO TABS: ORAL_TABLET | ORAL | 3 refills | Status: DC

## 2019-12-30 NOTE — Telephone Encounter (Signed)
Refill request received via fax  Last Visit: 11/04/2019 Next Visit: 02/03/2020

## 2020-01-05 NOTE — Progress Notes (Signed)
Subjective:    Patient ID: Jill Harper, female    DOB: 1966/05/17, 54 y.o.   MRN: [id number]  HPI The patient is here for an acute visit a different type of headache - concern it is related to her BP.   This past week or so she has had different headaches.  These are different than her migraines.  She is concerned they feel like high BP headaches.  The pain came up from her neck to behind her eye. Her scalp felt funny.  She felt out of sort.  They would hit several times on different days. The first several times it occurred it lasted 2-3 hrs.  The last episode a few days ago it last a few hours.  Her BP at that time was 128/74.   She does not check her BP regularly.  She is taking her medication daily.    Fibromyalgia, polymyositis:  She has taken flexeril in the past and discussed this with her rheumatologist and she would like me to prescribe.  She has taken it at night int he past - would take 2.5 mg of flexeril only because it made her drowsy.  She would like a prescription.     Medications and allergies reviewed with patient and updated if appropriate.  Patient Active Problem List   Diagnosis Date Noted  . Bilateral carpal tunnel syndrome 07/21/2019  . Costochondritis 03/16/2019  . Rib pain on right side 03/16/2019  . Insulin resistance 11/27/2018  . Class 3 severe obesity with serious comorbidity and body mass index (BMI) of 40.0 to 44.9 in adult [facility]) 11/27/2018  . Fibromuscular dysplasia ([place]) 10/17/2018  . Hypertension 10/17/2018  . Bronchiectasis with (acute) exacerbation ([place]) 10/08/2018  . OSA (obstructive sleep apnea) 08/20/2018  . Rheumatoid arthritis ([place]) 04/17/2018  . ILD (interstitial lung disease) ([place]) 04/17/2018  . Dyspnea 04/01/2018  . Bronchiectasis ([place]) 09/02/2017  . LVH (left ventricular hypertrophy) 06/02/2017  . Mitral regurgitation 05/22/2017  . Jo-1 antibody positive 01/29/2017  . Primary osteoarthritis of both feet 01/29/2017  . Primary  osteoarthritis of both hands 01/29/2017  . Left shoulder tendonitis 01/17/2017  . Vitamin D deficiency 04/26/2016  . Cough 07/18/2015  . MVP (mitral valve prolapse)   . Erythema nodosum   . INCONTINENCE, URGE 05/26/2010  . Migraine headache 02/15/2010  . Fibromyalgia 06/20/2009  . Polymyositis ([place]) 02/11/2008  . BRONCHIECTASIS 02/10/2008    Current Outpatient Medications on File Prior to Visit  Medication Sig Dispense Refill  . albuterol (PROVENTIL HFA) 108 (90 Base) MCG/ACT inhaler Inhale 2 puffs into the lungs 4 (four) times daily as needed. Reported on 09/03/2015 1 Inhaler 4  . albuterol (PROVENTIL) (2.5 MG/3ML) 0.083% nebulizer solution Take 3 mLs (2.5 mg total) by nebulization every 6 (six) hours as needed for wheezing or shortness of breath. 120 vial 5  . aspirin EC 81 MG tablet Take 81 mg by mouth daily.    [name] atenolol (TENORMIN) 25 MG tablet TAKE 1/2 TABLET BY MOUTH DAILY AS NEEDED FOR MITRAL PROLAPSE 45 tablet 0  . azaTHIOprine (IMURAN) 50 MG tablet TAKE 2 TABLETS(100 MG) BY MOUTH DAILY 60 tablet 2  . benzonatate (TESSALON) 200 MG capsule Take 1 capsule (200 mg total) by mouth 3 (three) times daily as needed for cough. 30 capsule 1  . diclofenac Sodium (VOLTAREN) 1 % GEL Apply 2-4 grams to affected joint 4 times daily as needed. 400 g 2  . eletriptan (RELPAX) 20 MG tablet Take 1 tablet (20  mg total) by mouth as needed for migraine. 10 tablet 11  . fluticasone furoate-vilanterol (BREO ELLIPTA) 100-25 MCG/INH AEPB INHALE 1 PUFF INTO THE LUNGS DAILY 60 each 5  . folic acid (FOLVITE) 1 MG tablet TAKE 2 TABLETS(2 MG) BY MOUTH DAILY 180 tablet 3  . Fremanezumab-vfrm (AJOVY) 225 MG/1.5ML SOAJ Inject 225 mg into the skin every 30 (thirty) days. 1 pen 11  . guaiFENesin (MUCINEX) 600 MG 12 hr tablet Take 2 tablets (1,200 mg total) by mouth 2 (two) times daily as needed for cough or to loosen phlegm. (Patient taking differently: Take 1,200 mg by mouth 2 (two) times daily. )    . metFORMIN  (GLUCOPHAGE) 500 MG tablet TAKE 1 TABLET(500 MG) BY MOUTH TWICE DAILY WITH A MEAL 60 tablet 0  . Multiple Vitamins-Minerals (MULTIVITAMIN GUMMIES ADULT PO) Take 2 each by mouth daily.    [name] Respiratory Therapy Supplies (FLUTTER) DEVI Use as directed (Patient taking differently: 2 (two) times daily. Use as directed) 1 each 0  . telmisartan (MICARDIS) 20 MG tablet TAKE 1 TABLET(20 MG) BY MOUTH DAILY 90 tablet 1  . VITAMIN D PO Take by mouth.    . Vitamin D, Ergocalciferol, (DRISDOL) 1.25 MG (50000 UNIT) CAPS capsule Take 1 capsule (50,000 Units total) by mouth every 7 (seven) days. 4 capsule 0   No current facility-administered medications on file prior to visit.    Past Medical History:  Diagnosis Date  . Abnormal liver function test   . Bronchiectasis       . Dyspnea   . Fibromuscular dysplasia ([place])   . Fibromyalgia   . Heart murmur   . ILD (interstitial lung disease) ([place])   . Joint pain   . Menorrhagia   . MIGRAINE HEADACHE   . MVP (mitral valve prolapse)   . Osteoarthritis   . Polymyositis ([place])    Dr [name]; chronic MTX  . Pulmonary fibrosis ([place])   . Rheumatoid arthritis ([place])   . Sleep apnea   . Vitamin B 12 deficiency   . Vitamin D deficiency     Past Surgical History:  Procedure Laterality Date  . ablation uterine  2010  . CARDIAC CATHETERIZATION  2002   normal  . DILATION AND CURETTAGE OF UTERUS  08-31-08   Dr [name]  . IR ANGIO INTRA EXTRACRAN SEL COM CAROTID INNOMINATE BILAT MOD SED  09/18/2018  . IR ANGIO VERTEBRAL SEL VERTEBRAL BILAT MOD SED  09/18/2018  . IR US GUIDE VASC ACCESS RIGHT  09/18/2018    Social History   Socioeconomic History  . Marital status: Married    Spouse name: Carrieanne Kleen  . Number of children: 1  . Years of education: Not on file  . Highest education level: High school graduate  Occupational History  . Occupation: Compliant and Injury Clerk  Tobacco Use  . Smoking status: Never Smoker  . Smokeless tobacco: Never  Used  . Tobacco comment: Married, lives with spouse. 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 Alcohol use: Not Currently    Comment: maybe a wine cooler once every other year  . Drug use: Never  . Sexual activity: Yes    Birth control/protection: Surgical  Other Topics Concern  . Not on file  Social History Narrative   Exercise: trying to walk - limited by fatigue   Lives at home with husband    Right handed   Caffeine: occasional coke  1-2x per month   Social Determinants of Health   Financial Resource Strain:   . Difficulty of Paying Living Expenses:   Food Insecurity:   . Worried About Charity fundraiser in the Last Year:   . Arboriculturist in the Last Year:   Transportation Needs:   . Film/video editor (Medical):   [name] Lack of Transportation (Non-Medical):   Physical Activity:   . Days of Exercise per Week:   . Minutes of Exercise per Session:   Stress:   . Feeling of Stress :   Social Connections:   . Frequency of Communication with Friends and Family:   . Frequency of Social Gatherings with Friends and Family:   . Attends Religious Services:   . Active Member of Clubs or Organizations:   . Attends Archivist Meetings:   [name] Marital Status:     Family History  Problem Relation Age of Onset  . Diabetes Mother   . Fibromyalgia Mother   . Ulcers Mother   . Heart failure Mother   . Thyroid disease Mother   . Obesity Mother   . Multiple myeloma Father   . Hypertension Father   . Stroke Father   . Lupus Sister   . Other Sister        abdominal adhesions resulting in bowel obstruction  . Migraines Sister   . Heart disease Brother        bypass surgery  . Rheum arthritis Sister   . Multiple myeloma Sister   . Hypertension Sister   . Heart attack Sister   . Diabetes Sister   . Hypertension Sister   . Rheum arthritis Sister   . Diabetes Sister   . Diabetes Brother   . Headache Other         siblings with headaches but not diagnosed as migraines    Review of Systems  Eyes: Negative for visual disturbance.  Cardiovascular: Negative for chest pain.  Musculoskeletal: Positive for neck stiffness (right).  Neurological: Positive for light-headedness (some), numbness (scalp - left) and headaches.       Objective:   Vitals:   01/06/20 1533  BP: (!) 144/72  Pulse: 77  Resp: 16  Temp: 98.5 F (36.9 C)  SpO2: 99%   BP Readings from Last 3 Encounters:  01/06/20 (!) 144/72  12/17/19 124/80  11/04/19 120/72   Wt Readings from Last 3 Encounters:  01/06/20 271 lb 9.6 oz (123.2 kg)  12/17/19 269 lb 12.8 oz (122.4 kg)  11/04/19 265 lb (120.2 kg)   Body mass index is 41.3 kg/m.   Physical Exam    Constitutional: Appears well-developed and well-nourished. No distress.  Head: Normocephalic and atraumatic.  MSK: no upper back or neck pain with palpation, no c-spine tenderness or tenderness at base of skull Skin: Skin is warm and dry. Not diaphoretic.  Psychiatric: Normal mood and affect. Behavior is normal.       Assessment & Plan:    See Problem List for Assessment and Plan of chronic medical problems.    This visit occurred during the SARS-CoV-2 public health emergency.  Safety protocols were in place, including screening questions prior to the visit, additional usage of staff PPE, and extensive cleaning of exam room while observing appropriate contact time as indicated for disinfecting solutions.

## 2020-01-06 ENCOUNTER — Encounter: Payer: Self-pay | Admitting: Internal Medicine

## 2020-01-06 ENCOUNTER — Ambulatory Visit: Payer: Federal, State, Local not specified - PPO | Admitting: Internal Medicine

## 2020-01-06 ENCOUNTER — Other Ambulatory Visit: Payer: Self-pay

## 2020-01-06 VITALS — BP 144/72 | HR 77 | Temp 98.5°F | Resp 16 | Ht 68.0 in | Wt 271.6 lb

## 2020-01-06 DIAGNOSIS — M797 Fibromyalgia: Secondary | ICD-10-CM

## 2020-01-06 DIAGNOSIS — M3320 Polymyositis, organ involvement unspecified: Secondary | ICD-10-CM

## 2020-01-06 DIAGNOSIS — M5481 Occipital neuralgia: Secondary | ICD-10-CM | POA: Diagnosis not present

## 2020-01-06 DIAGNOSIS — Z23 Encounter for immunization: Secondary | ICD-10-CM

## 2020-01-06 MED ORDER — METHOCARBAMOL 500 MG PO TABS: 500.0000 mg | ORAL_TABLET | Freq: Three times a day (TID) | ORAL | 5 refills | Status: DC | PRN

## 2020-01-06 NOTE — Assessment & Plan Note (Signed)
Chronic Following with rheumatology On imuran Would like to restart muscle relaxer - will try methocarbamol since flexeril made her very drowsy - if not effective will go back to low dose flexeril F/u in september

## 2020-01-06 NOTE — Patient Instructions (Addendum)
I think your headaches are coming from the neck on her left side.  Treat it with heat/ice and otc pain medications.  You can massage and stretch the area.  If the headaches continue let me know.     Start methocarbamol as needed for a muscle relaxer.

## 2020-01-06 NOTE — Assessment & Plan Note (Signed)
New Pain from left side of neck up head to eye, funny feeling in scalp Intermittent, but can last hrs Muscle relaxer may help since she tends to hold tension in her neck/upper back Try heat/ice otc pain medications Reassured they are not related to her BP Follow up if no improvement

## 2020-01-06 NOTE — Assessment & Plan Note (Signed)
Chronic Restart muscle relaxer - methocarbamol

## 2020-01-07 NOTE — Telephone Encounter (Signed)
Faxed last office note to [place] with [place]. Pt enrolled in [facility] disease management program per request received. Received a receipt of confirmation.

## 2020-01-27 DIAGNOSIS — G4733 Obstructive sleep apnea (adult) (pediatric): Secondary | ICD-10-CM | POA: Diagnosis not present

## 2020-01-27 DIAGNOSIS — J471 Bronchiectasis with (acute) exacerbation: Secondary | ICD-10-CM | POA: Diagnosis not present

## 2020-01-29 NOTE — Progress Notes (Deleted)
Office Visit Note  Patient: Jill Harper             Date of Birth: 1966/03/11           MRN: [id number]             PCP: Binnie Rail, MD Referring: Binnie Rail, MD Visit Date: 02/03/2020 Occupation: @GUAROCC @  Subjective:  No chief complaint on file.   History of Present Illness: Jill Harper is a 54 y.o. female ***   Activities of Daily Living:  Patient reports morning stiffness for *** {minute/hour:19697}.   Patient {ACTIONS;DENIES/REPORTS:21021675::"Denies"} nocturnal pain.  Difficulty dressing/grooming: {ACTIONS;DENIES/REPORTS:21021675::"Denies"} Difficulty climbing stairs: {ACTIONS;DENIES/REPORTS:21021675::"Denies"} Difficulty getting out of chair: {ACTIONS;DENIES/REPORTS:21021675::"Denies"} Difficulty using hands for taps, buttons, cutlery, and/or writing: {ACTIONS;DENIES/REPORTS:21021675::"Denies"}  No Rheumatology ROS completed.   PMFS History:  Patient Active Problem List   Diagnosis Date Noted  . Occipital neuralgia of left side 01/06/2020  . Bilateral carpal tunnel syndrome 07/21/2019  . Costochondritis 03/16/2019  . Rib pain on right side 03/16/2019  . Insulin resistance 11/27/2018  . Class 3 severe obesity with serious comorbidity and body mass index (BMI) of 40.0 to 44.9 in adult [facility]) 11/27/2018  . Fibromuscular dysplasia ([place]) 10/17/2018  . Hypertension 10/17/2018  . Bronchiectasis with (acute) exacerbation ([place]) 10/08/2018  . OSA (obstructive sleep apnea) 08/20/2018  . Rheumatoid arthritis ([place]) 04/17/2018  . ILD (interstitial lung disease) ([place]) 04/17/2018  . Dyspnea 04/01/2018  . Bronchiectasis ([place]) 09/02/2017  . LVH (left ventricular hypertrophy) 06/02/2017  . Mitral regurgitation 05/22/2017  . Jo-1 antibody positive 01/29/2017  . Primary osteoarthritis of both feet 01/29/2017  . Primary osteoarthritis of both hands 01/29/2017  . Left shoulder tendonitis 01/17/2017  . Vitamin D deficiency 04/26/2016  . Cough 07/18/2015  . MVP (mitral  valve prolapse)   . Erythema nodosum   . INCONTINENCE, URGE 05/26/2010  . Migraine headache 02/15/2010  . Fibromyalgia 06/20/2009  . Polymyositis ([place]) 02/11/2008  . BRONCHIECTASIS 02/10/2008    Past Medical History:  Diagnosis Date  . Abnormal liver function test   . Bronchiectasis       . Dyspnea   . Fibromuscular dysplasia ([place])   . Fibromyalgia   . Heart murmur   . ILD (interstitial lung disease) ([place])   . Joint pain   . Menorrhagia   . MIGRAINE HEADACHE   . MVP (mitral valve prolapse)   . Osteoarthritis   . Polymyositis ([place])    Dr [name]; chronic MTX  . Pulmonary fibrosis ([place])   . Rheumatoid arthritis ([place])   . Sleep apnea   . Vitamin B 12 deficiency   . Vitamin D deficiency     Family History  Problem Relation Age of Onset  . Diabetes Mother   . Fibromyalgia Mother   . Ulcers Mother   . Heart failure Mother   . Thyroid disease Mother   . Obesity Mother   . Multiple myeloma Father   . Hypertension Father   . Stroke Father   . Lupus Sister   . Other Sister        abdominal adhesions resulting in bowel obstruction  . Migraines Sister   . Heart disease Brother        bypass surgery  . Rheum arthritis Sister   . Multiple myeloma Sister   . Hypertension Sister   . Heart attack Sister   . Diabetes Sister   . Hypertension Sister   . Rheum arthritis Sister   . Diabetes Sister   .  Diabetes Brother   . Headache Other        siblings with headaches but not diagnosed as migraines   Past Surgical History:  Procedure Laterality Date  . ablation uterine  2010  . CARDIAC CATHETERIZATION  2002   normal  . DILATION AND CURETTAGE OF UTERUS  08-31-08   Dr [name]  . IR ANGIO INTRA EXTRACRAN SEL COM CAROTID INNOMINATE BILAT MOD SED  09/18/2018  . IR ANGIO VERTEBRAL SEL VERTEBRAL BILAT MOD SED  09/18/2018  . IR US GUIDE VASC ACCESS RIGHT  09/18/2018   Social History   Social History Narrative   Exercise: trying to walk - limited by fatigue    Lives at home with husband    Right handed   Caffeine: occasional coke 1-2x per month   Immunization History  Administered Date(s) Administered  . Influenza Split 04/10/2011, 06/18/2012, 05/06/2013  . Influenza Whole 05/19/2008, 05/15/2009, 06/01/2010  . Influenza,inj,Quad PF,6+ Mos 04/20/2014, 05/26/2015, 05/31/2016, 04/05/2017, 06/16/2018, 04/22/2019  . PFIZER SARS-COV-2 Vaccination 10/27/2019, 11/19/2019  . Pneumococcal Conjugate-13 01/29/2017  . Pneumococcal Polysaccharide-23 08/07/2007, 06/16/2018  . Td 07/12/2009  . Tdap 01/06/2020     Objective: Vital Signs: There were no vitals taken for this visit.   Physical Exam   Musculoskeletal Exam: ***  CDAI Exam: CDAI Score: -- Patient Global: --; Provider Global: -- Swollen: --; Tender: -- Joint Exam 02/03/2020   No joint exam has been documented for this visit   There is currently no information documented on the homunculus. Go to the Rheumatology activity and complete the homunculus joint exam.  Investigation: No additional findings.  Imaging: No results found.  Recent Labs: Lab Results  Component Value Date   WBC 8.6 12/25/2019   HGB 13.7 12/25/2019   PLT 308 12/25/2019   NA 141 12/25/2019   K 4.3 12/25/2019   CL 108 12/25/2019   CO2 27 12/25/2019   GLUCOSE 122 (H) 12/25/2019   BUN 15 12/25/2019   CREATININE 0.66 12/25/2019   BILITOT 0.3 12/25/2019   ALKPHOS 76 10/12/2019   AST 12 12/25/2019   ALT 13 12/25/2019   PROT 7.6 12/25/2019   ALBUMIN 4.6 10/12/2019   CALCIUM 9.7 12/25/2019   GFRAA 116 12/25/2019    Speciality Comments: Prior therapy includes: MTX (d/c due to ILD progresssion)  Procedures:  No procedures performed Allergies: Sulfonamide derivatives   Assessment / Plan:     Visit Diagnoses: No diagnosis found.  Orders: No orders of the defined types were placed in this encounter.  No orders of the defined types were placed in this encounter.   Face-to-face time spent with patient  was *** minutes. Greater than 50% of time was spent in counseling and coordination of care.  Follow-Up Instructions: No follow-ups on file.   Ofilia Neas, PA-C  Note - This record has been created using Dragon software.  Chart creation errors have been sought, but may not always  have been located. Such creation errors do not reflect on  the standard of medical care.

## 2020-02-02 ENCOUNTER — Other Ambulatory Visit: Payer: Self-pay

## 2020-02-02 ENCOUNTER — Encounter: Payer: Self-pay | Admitting: Physician Assistant

## 2020-02-02 ENCOUNTER — Ambulatory Visit: Payer: Federal, State, Local not specified - PPO | Admitting: Physician Assistant

## 2020-02-02 VITALS — BP 137/82 | HR 73 | Resp 16 | Ht 68.0 in | Wt 273.6 lb

## 2020-02-02 DIAGNOSIS — M19042 Primary osteoarthritis, left hand: Secondary | ICD-10-CM

## 2020-02-02 DIAGNOSIS — M3320 Polymyositis, organ involvement unspecified: Secondary | ICD-10-CM

## 2020-02-02 DIAGNOSIS — M797 Fibromyalgia: Secondary | ICD-10-CM

## 2020-02-02 DIAGNOSIS — M0579 Rheumatoid arthritis with rheumatoid factor of multiple sites without organ or systems involvement: Secondary | ICD-10-CM | POA: Diagnosis not present

## 2020-02-02 DIAGNOSIS — R768 Other specified abnormal immunological findings in serum: Secondary | ICD-10-CM

## 2020-02-02 DIAGNOSIS — G5603 Carpal tunnel syndrome, bilateral upper limbs: Secondary | ICD-10-CM

## 2020-02-02 DIAGNOSIS — Z807 Family history of other malignant neoplasms of lymphoid, hematopoietic and related tissues: Secondary | ICD-10-CM | POA: Diagnosis not present

## 2020-02-02 DIAGNOSIS — Z79899 Other long term (current) drug therapy: Secondary | ICD-10-CM

## 2020-02-02 DIAGNOSIS — I773 Arterial fibromuscular dysplasia: Secondary | ICD-10-CM

## 2020-02-02 DIAGNOSIS — M19041 Primary osteoarthritis, right hand: Secondary | ICD-10-CM

## 2020-02-02 DIAGNOSIS — Z8669 Personal history of other diseases of the nervous system and sense organs: Secondary | ICD-10-CM

## 2020-02-02 DIAGNOSIS — M19071 Primary osteoarthritis, right ankle and foot: Secondary | ICD-10-CM

## 2020-02-02 DIAGNOSIS — M19072 Primary osteoarthritis, left ankle and foot: Secondary | ICD-10-CM

## 2020-02-02 DIAGNOSIS — Z111 Encounter for screening for respiratory tuberculosis: Secondary | ICD-10-CM | POA: Diagnosis not present

## 2020-02-02 DIAGNOSIS — Z8679 Personal history of other diseases of the circulatory system: Secondary | ICD-10-CM

## 2020-02-02 DIAGNOSIS — J479 Bronchiectasis, uncomplicated: Secondary | ICD-10-CM

## 2020-02-02 DIAGNOSIS — L52 Erythema nodosum: Secondary | ICD-10-CM

## 2020-02-02 DIAGNOSIS — E559 Vitamin D deficiency, unspecified: Secondary | ICD-10-CM

## 2020-02-02 DIAGNOSIS — R5383 Other fatigue: Secondary | ICD-10-CM

## 2020-02-02 DIAGNOSIS — J849 Interstitial pulmonary disease, unspecified: Secondary | ICD-10-CM

## 2020-02-02 NOTE — Patient Instructions (Addendum)
Standing Labs We placed an order today for your standing lab work.   Please have your standing labs drawn in 1 month then every 3 months   If possible, please have your labs drawn 2 weeks prior to your appointment so that the provider can discuss your results at your appointment.  We have open lab daily Monday through Thursday from 8:30-12:30 PM and 1:30-4:30 PM and Friday from 8:30-12:30 PM and 1:30-4:00 PM at the office of Dr. Bo Merino, [facility].   You may experience shorter wait times on Monday and Friday afternoons. The office is located at [address] [place], [place], [place] [address] No appointment is necessary.   Labs are drawn by [facility].  You may receive a bill from [facility] for your lab work.  If you wish to have your labs drawn at another location, please call the office 24 hours in advance to send orders.  If you have any questions regarding directions or hours of operation,  please call [phone].   As a reminder, please drink plenty of water prior to coming for your lab work. Thanks!    Etanercept injection What is this medicine? ETANERCEPT (et a [employer]) is used for the treatment of rheumatoid arthritis in adults and children. The medicine is also used to treat psoriatic arthritis, ankylosing spondylitis, and psoriasis. This medicine may be used for other purposes; ask your health care provider or pharmacist if you have questions. COMMON BRAND NAME(S): Enbrel What should I tell my health care provider before I take this medicine? They need to know if you have any of these conditions:  blood disorders  cancer  congestive heart failure  diabetes  exposure to chickenpox  immune system problems  infection  multiple sclerosis  seizure disorder  tuberculosis, a positive skin test for tuberculosis or have recently been in close contact with someone who has tuberculosis  Wegener's granulomatosis  an unusual or allergic  reaction to etanercept, latex, other medicines, foods, dyes, or preservatives  pregnant or trying to get pregnant  breast-feeding How should I use this medicine? The medicine is given by injection under the skin. You will be taught how to prepare and give this medicine. Use exactly as directed. Take your medicine at regular intervals. Do not take your medicine more often than directed. It is important that you put your used needles and syringes in a special sharps container. Do not put them in a trash can. If you do not have a sharps container, call your pharmacist or healthcare provider to get one. A special MedGuide will be given to you by the pharmacist with each prescription and refill. Be sure to read this information carefully each time. Talk to your pediatrician regarding the use of this medicine in children. While this drug may be prescribed for children as young as 15 years of age for selected conditions, precautions do apply. Overdosage: If you think you have taken too much of this medicine contact a poison control center or emergency room at once. NOTE: This medicine is only for you. Do not share this medicine with others. What if I miss a dose? If you miss a dose, contact your health care professional to find out when you should take your next dose. Do not take double or extra doses without advice. What may interact with this medicine? Do not take this medicine with any of the following medications:  anakinra This medicine may also interact with the following medications:  cyclophosphamide  sulfasalazine  vaccines This list may not describe all possible interactions. Give your health care provider a list of all the medicines, herbs, non-prescription drugs, or dietary supplements you use. Also tell them if you smoke, drink alcohol, or use illegal drugs. Some items may interact with your medicine. What should I watch for while using this medicine? Tell your doctor or healthcare  professional if your symptoms do not start to get better or if they get worse. You will be tested for tuberculosis (TB) before you start this medicine. If your doctor prescribes any medicine for TB, you should start taking the TB medicine before starting this medicine. Make sure to finish the full course of TB medicine. Call your doctor or health care professional for advice if you get a fever, chills or sore throat, or other symptoms of a cold or flu. Do not treat yourself. This drug decreases your body's ability to fight infections. Try to avoid being around people who are sick. What side effects may I notice from receiving this medicine? Side effects that you should report to your doctor or health care professional as soon as possible:  allergic reactions like skin rash, itching or hives, swelling of the face, lips, or tongue  changes in vision  fever, chills or any other sign of infection  numbness or tingling in legs or other parts of the body  red, scaly patches or raised bumps on the skin  shortness of breath or difficulty breathing  swollen lymph nodes in the neck, underarm, or groin areas  unexplained weight loss  unusual bleeding or bruising  unusual swelling or fluid retention in the legs  unusually weak or tired Side effects that usually do not require medical attention (report to your doctor or health care professional if they continue or are bothersome):  dizziness  headache  nausea  redness, itching, or swelling at the injection site  vomiting This list may not describe all possible side effects. Call your doctor for medical advice about side effects. You may report side effects to FDA at 1-800-FDA-1088. Where should I keep my medicine? Keep out of the reach of children. Enbrel products: Store unopened Enbrel vials, cartridges, or pens in a refrigerator between 2 and 8 degrees C (36 and 46 degrees F). Do not freeze. Do not shake. Keep in the original container  to protect from light. Throw away any unopened and unused medicine that has been stored in the refrigerator after the expiration date. If needed, you may store an Enbrel single-use vial, single-dose prefilled syringe, SureClick autoinjector, or Enbrel Mini cartridge at room temperature between 20 to 25 degrees C (68 to 77 degrees F) for up to 14 days. Protect from light, heat, and moisture. Do not shake. Once any of these items are stored at room temperature, do not place them back into the refrigerator. Throw the item away after 14 days, even if it still contains medicine. If you are using the Enbrel multi-dose vials, you will be instructed on how to dilute and store this medicine once it has been diluted. The Enbrel AutoTouch reusable autoinjector device should be stored at room temperature. Do NOT refrigerate the AutoTouch reusable autoinjector. Erelzi products: Store Actuary pen in the refrigerator between 2 and 8 degrees C (36 and 46 degrees F). Do not freeze. Do not shake. Keep in the original container to protect from light. Throw away any unopened and unused medicine that has been stored in the refrigerator after the expiration date. If  needed, you may store the [facility] prefilled syringe or pen at room temperature between 20 to 25 degrees C (68 to 77 degrees F) for up to 28 days. Protect from light, heat, and moisture. Do not shake. Once any of these items are stored at room temperature, do not place them back into the refrigerator. Throw the item away after 28 days, even if it still contains medicine. NOTE: This sheet is a summary. It may not cover all possible information. If you have questions about this medicine, talk to your doctor, pharmacist, or health care provider.  2020 Elsevier/Gold Standard (2018-10-20 09:49:58)

## 2020-02-02 NOTE — Progress Notes (Signed)
Office Visit Note  Patient: Jill Harper             Date of Birth: 1965/08/18           MRN: [id number]             PCP: Binnie Rail, MD Referring: Binnie Rail, MD Visit Date: 02/02/2020 Occupation: @GUAROCC @  Subjective:  Right thumb pain  History of Present Illness: Jill Harper is a 54 y.o. female with history of seropositive rheumatoid arthritis and polymyositis.  She is taking imuran 50 mg 2 tablets daily.  She denies missing any doses recently. She presents today with rightthumb pain, warmth, and swelling which started this morning.  She is using voltaren gel topically and tylenol for pain relief. She continues to follow along closely with Dr. Fredna Dow.  According to the patient she continues to follow up with Dr. Fredna Dow and has intermittent pain and inflammation over the volar aspect of the right wrist. She states she has intermittent paresthesias in the right hand and had a NCV with EMG.  According to the patient Dr. Fredna Dow does not feel that her RA is well controlled at this time. She has also been experiencing increased fatigue.  She has chronic pain in both shoulder joints.  She recently went to [facility] physical therapy and has been performing home exercises.   Activities of Daily Living:  Patient reports morning stiffness for 10-15 minutes.   Patient Reports nocturnal pain.  Difficulty dressing/grooming: Reports Difficulty climbing stairs: Denies Difficulty getting out of chair: Denies Difficulty using hands for taps, buttons, cutlery, and/or writing: Reports  Review of Systems  Constitutional: Positive for fatigue.  HENT: Negative for mouth sores, mouth dryness and nose dryness.   Eyes: Positive for dryness. Negative for pain and redness.  Respiratory: Positive for shortness of breath and difficulty breathing.   Cardiovascular: Negative for chest pain and palpitations.  Gastrointestinal: Negative for blood in stool, constipation and diarrhea.  Endocrine: Negative for  increased urination.  Genitourinary: Negative for difficulty urinating.  Musculoskeletal: Positive for arthralgias, joint pain, joint swelling, myalgias, morning stiffness, muscle tenderness and myalgias.  Skin: Positive for rash. Negative for color change.  Neurological: Positive for numbness, headaches, memory loss and weakness. Negative for dizziness.  Hematological: Positive for bruising/bleeding tendency.  Psychiatric/Behavioral: Negative for confusion.    PMFS History:  Patient Active Problem List   Diagnosis Date Noted  . Occipital neuralgia of left side 01/06/2020  . Bilateral carpal tunnel syndrome 07/21/2019  . Costochondritis 03/16/2019  . Rib pain on right side 03/16/2019  . Insulin resistance 11/27/2018  . Class 3 severe obesity with serious comorbidity and body mass index (BMI) of 40.0 to 44.9 in adult [facility]) 11/27/2018  . Fibromuscular dysplasia ([place]) 10/17/2018  . Hypertension 10/17/2018  . Bronchiectasis with (acute) exacerbation ([place]) 10/08/2018  . OSA (obstructive sleep apnea) 08/20/2018  . Rheumatoid arthritis ([place]) 04/17/2018  . ILD (interstitial lung disease) ([place]) 04/17/2018  . Dyspnea 04/01/2018  . Bronchiectasis ([place]) 09/02/2017  . LVH (left ventricular hypertrophy) 06/02/2017  . Mitral regurgitation 05/22/2017  . Jo-1 antibody positive 01/29/2017  . Primary osteoarthritis of both feet 01/29/2017  . Primary osteoarthritis of both hands 01/29/2017  . Left shoulder tendonitis 01/17/2017  . Vitamin D deficiency 04/26/2016  . Cough 07/18/2015  . MVP (mitral valve prolapse)   . Erythema nodosum   . INCONTINENCE, URGE 05/26/2010  . Migraine headache 02/15/2010  . Fibromyalgia 06/20/2009  . Polymyositis ([place]) 02/11/2008  .  BRONCHIECTASIS 02/10/2008    Past Medical History:  Diagnosis Date  . Abnormal liver function test   . Bronchiectasis       . Dyspnea   . Fibromuscular dysplasia ([place])   . Fibromyalgia   . Heart murmur   . ILD (interstitial lung  disease) ([place])   . Joint pain   . Menorrhagia   . MIGRAINE HEADACHE   . MVP (mitral valve prolapse)   . Osteoarthritis   . Polymyositis ([place])    Dr [name]; chronic MTX  . Pulmonary fibrosis ([place])   . Rheumatoid arthritis ([place])   . Sleep apnea   . Vitamin B 12 deficiency   . Vitamin D deficiency     Family History  Problem Relation Age of Onset  . Diabetes Mother   . Fibromyalgia Mother   . Ulcers Mother   . Heart failure Mother   . Thyroid disease Mother   . Obesity Mother   . Multiple myeloma Father   . Hypertension Father   . Stroke Father   . Lupus Sister   . Other Sister        abdominal adhesions resulting in bowel obstruction  . Migraines Sister   . Heart disease Brother        bypass surgery  . Rheum arthritis Sister   . Multiple myeloma Sister   . Hypertension Sister   . Heart attack Sister   . Diabetes Sister   . Hypertension Sister   . Rheum arthritis Sister   . Diabetes Sister   . Diabetes Brother   . Headache Other        siblings with headaches but not diagnosed as migraines   Past Surgical History:  Procedure Laterality Date  . ablation uterine  2010  . CARDIAC CATHETERIZATION  2002   normal  . DILATION AND CURETTAGE OF UTERUS  08-31-08   Dr [name]  . IR ANGIO INTRA EXTRACRAN SEL COM CAROTID INNOMINATE BILAT MOD SED  09/18/2018  . IR ANGIO VERTEBRAL SEL VERTEBRAL BILAT MOD SED  09/18/2018  . IR US GUIDE VASC ACCESS RIGHT  09/18/2018   Social History   Social History Narrative   Exercise: trying to walk - limited by fatigue   Lives at home with husband    Right handed   Caffeine: occasional coke 1-2x per month   Immunization History  Administered Date(s) Administered  . Influenza Split 04/10/2011, 06/18/2012, 05/06/2013  . Influenza Whole 05/19/2008, 05/15/2009, 06/01/2010  . Influenza,inj,Quad PF,6+ Mos 04/20/2014, 05/26/2015, 05/31/2016, 04/05/2017, 06/16/2018, 04/22/2019  . PFIZER SARS-COV-2 Vaccination 10/27/2019,  11/19/2019  . Pneumococcal Conjugate-13 01/29/2017  . Pneumococcal Polysaccharide-23 08/07/2007, 06/16/2018  . Td 07/12/2009  . Tdap 01/06/2020     Objective: Vital Signs: BP 137/82 (BP Location: Left Arm, Patient Position: Sitting, Cuff Size: Normal)   Pulse 73   Resp 16   Ht 5' 8"  (1.727 m)   Wt 273 lb 9.6 oz (124.1 kg)   BMI 41.60 kg/m    Physical Exam Vitals and nursing note reviewed.  Constitutional:      Appearance: She is well-developed.  HENT:     Head: Normocephalic and atraumatic.  Eyes:     Conjunctiva/sclera: Conjunctivae normal.  Pulmonary:     Effort: Pulmonary effort is normal.  Abdominal:     General: Bowel sounds are normal.     Palpations: Abdomen is soft.  Musculoskeletal:     Cervical back: Normal range of motion.  Lymphadenopathy:     Cervical:  No cervical adenopathy.  Skin:    General: Skin is warm and dry.     Capillary Refill: Capillary refill takes less than 2 seconds.  Neurological:     Mental Status: She is alert and oriented to person, place, and time.  Psychiatric:        Behavior: Behavior normal.      Musculoskeletal Exam: C-spine, thoracic spine, and lumbar spine good ROM.  Shoulder joints good ROM with discomfort bilaterally.  Elbow joints and wrist joints good ROM with no tenderness or inflammation.  Tenderness and synovitis of the right 1st MCP and 1st PIP joint.  Hip joints good ROM bilaterally.  Tenderness over bilateral trochanteric bursa.  Knee joints good ROM with no warmth or effusion.  Ankle joints good ROM with no tenderness or inflammation.  Pedal edema bilaterally.    CDAI Exam: CDAI Score: 10  Patient Global: 5 mm; Provider Global: 5 mm Swollen: 2 ; Tender: 7  Joint Exam 02/02/2020      Right  Left  Glenohumeral   Tender   Tender  MCP 1  Swollen Tender     MCP 2   Tender     IP  Swollen Tender     PIP 2   Tender     PIP 5   Tender        Investigation: No additional findings.  Imaging: No results  found.  Recent Labs: Lab Results  Component Value Date   WBC 8.6 12/25/2019   HGB 13.7 12/25/2019   PLT 308 12/25/2019   NA 141 12/25/2019   K 4.3 12/25/2019   CL 108 12/25/2019   CO2 27 12/25/2019   GLUCOSE 122 (H) 12/25/2019   BUN 15 12/25/2019   CREATININE 0.66 12/25/2019   BILITOT 0.3 12/25/2019   ALKPHOS 76 10/12/2019   AST 12 12/25/2019   ALT 13 12/25/2019   PROT 7.6 12/25/2019   ALBUMIN 4.6 10/12/2019   CALCIUM 9.7 12/25/2019   GFRAA 116 12/25/2019    Speciality Comments: Prior therapy includes: MTX (d/c due to ILD progresssion)  Procedures:  No procedures performed Allergies: Sulfonamide derivatives     Assessment / Plan:     Visit Diagnoses: Polymyositis ([place]) - Dx by Dr. Charlestine Night 2007, CK 801, Jo-1 positive: She has not had any signs or symptoms of a flare.  She is not having any muscle weakness at this time. She has intermittent myalgias and muscle tenderness due to underlying fibromyalgia.  Positive tender points were noted on exam.  She had no difficulty rising from a seated position or raising her arms above her head.  She is clinically doing well on Imuran 100 mg daily.  Her CK was 48 on 12/25/19.  We will continue to monitor lab work every 3 months.  She was advised to notify us if she develops signs or symptoms of a flare.  She will follow up in 6 weeks.   Rheumatoid arthritis involving multiple sites with positive rheumatoid factor (HCC) -  +RF, +CCP: She has tenderness and synovitis of the right 1st MCP and PIP joint.  She has been having increased pain and intermittent inflammation in both hands and right wrist joint, consistent with flexor tenosynovitis.  She is currently taking Imuran 50 mg 2 tablets by mouth daily.  We discussed adding on Enbrel 50 mg sq weekly injections.  Indications, contraindications, and potential side effects of Enbrel were discussed.  All questions were addressed and consent was obtained.  We will apply for  enbrel through her insurance  and once approved she will return to the office for the administration of the first injection.  She will continue taking Imuran as prescribed at this time.  She will follow up in 6 weeks to assess her response.   Medication counseling:   HIV negative on 01/31/17 Hepatitis panel negative on 01/31/17 SPEP chronic inflammatory response. Order released today to recheck.  Immunoglobulin: 01/31/17 TB gold pending Chest x-ray: 12/17/19   Does patient have diagnosis of heart failure?  No  Counseled patient that Enbrel is a TNF blocking agent.  Reviewed Enbrel dose of 50 mg once weekly.  Counseled patient on purpose, proper use, and adverse effects of Enbrel.  Reviewed the most common adverse effects including infections, headache, and injection site reactions. Discussed that there is the possibility of an increased risk of malignancy but it is not well understood if this increased risk is due to the medication or the disease state.  Advised patient to get yearly dermatology exams due to risk of skin cancer.  Reviewed the importance of regular labs while on Enbrel therapy.  Advised patient to get standing labs one month after starting Enbrel then every 2 months.  Provided patient with standing lab orders.  Counseled patient that Enbrel should be held prior to scheduled surgery.  Counseled patient to avoid live vaccines while on Enbrel.  Advised patient to get annual influenza vaccine and the pneumococcal vaccine as needed.  Provided patient with medication education material and answered all questions.  Patient voiced understanding.  Patient consented to Enbrel.  Will upload consent into the media tab.  Reviewed storage instructions for Enbrel.  Advised initial injection must be administered in office.  Patient voiced understanding.    High risk medication use - Imuran 100 mg po daily.  Applying for Enbrel 50 mg sq injections once weekly.    Discontinued methotrexate due to further progression of ILD. CBC and CMP  were drawn on 12/25/19.  Absolute lymphocyte count low.  We will recheck CBC today.  TB and SPEP will also be rechecked prior to starting on Enbrel.   - Plan: CBC with Differential/Platelet, QuantiFERON-TB Gold Plus, Serum protein electrophoresis with reflex  Screening for tuberculosis - Order for TB gold released today. Plan: QuantiFERON-TB Gold Plus  Family history of multiple myeloma -She requested to have SPEP rechecked due to her family history of multiple myeloma. Order was released.  Plan: Serum protein electrophoresis with reflex  Jo-1 antibody positive  ILD (interstitial lung disease) ([place]) - She is followed by [name].  She had an updated CXR on 12/17/19, which revealed chronic interstitial lung disease changes with bibasilar scarring.  Her most recent pulmonary evaluation was on 12/17/19 with [name], NP.  PFTs and walk test were performed. She has not had any new or worsening pulmonary symptoms recently.  She continues to take Imuran 50 mg 2 tablets daily.    Bronchiectasis without complication ([place]): Followed by pulmonary.   Bilateral carpal tunnel syndrome: NCV with EMG in the past.  She is followed by Dr. Fredna Dow.  She has intermittent paresthesias in the right hand and intermittent flexor tenosynovitis.  She will be starting on Enbrel 50 mg sq injections once weekly to control her RA.   Primary osteoarthritis of both hands: She has PIP and DIP thickening consistent with osteoarthritis of both hands.  She has been experiencing increased pain and stiffness in both hands, especially the right hand.  Joint protection and muscle strengthening were discussed.  Primary osteoarthritis of both feet: She is not having any discomfort in her feet at this time.   Fibromyalgia: She has generalized hyperalgesia and positive tender points on exam.  She has been having increased myalgias and muscle tenderness recently.  No muscle weakness was noted on exam.  She was encouraged to remain active and  exercise on a regular basis.   Other fatigue: Chronic and worsening recently.   Fibromuscular dysplasia [facility]):  She is taking Aspirin 81 mg daily  Vitamin D deficiency -Vitamin D is 27.5 on 10/12/19.  She took vitamin D 50,000 units by mouth once weekly for 3 months.  We will recheck vitamin D level today. Plan: VITAMIN D 25 Hydroxy (Vit-D Deficiency, Fractures)  History of migraine: She injects Ajovy every 30 days for management of migraines.   Erythema nodosum: No recurrence.   Other medical conditions are listed as follows:   History of mitral valve prolapse    Orders: Orders Placed This Encounter  Procedures  . VITAMIN D 25 Hydroxy (Vit-D Deficiency, Fractures)  . CBC with Differential/Platelet  . QuantiFERON-TB Gold Plus  . Serum protein electrophoresis with reflex   No orders of the defined types were placed in this encounter.   Face-to-face time spent with patient was 30 minutes. Greater than 50% of time was spent in counseling and coordination of care.  Follow-Up Instructions: Return in about 6 weeks (around 03/15/2020) for Rheumatoid arthritis, Polymyositis.   Ofilia Neas, PA-C  Note - This record has been created using Dragon software.  Chart creation errors have been sought, but may not always  have been located. Such creation errors do not reflect on  the standard of medical care.

## 2020-02-03 ENCOUNTER — Other Ambulatory Visit: Payer: Self-pay | Admitting: *Deleted

## 2020-02-03 ENCOUNTER — Ambulatory Visit: Payer: Federal, State, Local not specified - PPO | Admitting: Physician Assistant

## 2020-02-03 ENCOUNTER — Telehealth: Payer: Self-pay | Admitting: Pharmacist

## 2020-02-03 DIAGNOSIS — E559 Vitamin D deficiency, unspecified: Secondary | ICD-10-CM

## 2020-02-03 MED ORDER — VITAMIN D (ERGOCALCIFEROL) 1.25 MG (50000 UNIT) PO CAPS
50000.0000 [IU] | ORAL_CAPSULE | ORAL | 0 refills | Status: DC
Start: 2020-02-03 — End: 2020-06-22

## 2020-02-03 NOTE — Progress Notes (Signed)
Vitamin D is low-23.  Please notify the patient and send in vitamin D 50,000 units by mouth once weekly x3 months.  We will recheck in 3 months.  CBC WNL.

## 2020-02-03 NOTE — Telephone Encounter (Signed)
Please start benefits investigation for Enbrel for the treatment rheumatoid arthritis. She has tried and failed methotrexate and Imuran.   Mariella Saa, PharmD, [place], CPP Clinical Specialty Pharmacist (Rheumatology and Pulmonology)  02/03/2020 8:09 AM

## 2020-02-03 NOTE — Telephone Encounter (Signed)
-----   Message from Ofilia Neas, PA-C sent at 02/03/2020  7:45 AM EDT ----- Vitamin D is low-23.  Please notify the patient and send in vitamin D 50,000 units by mouth once weekly x3 months.  We will recheck in 3 months.  CBC WNL.

## 2020-02-03 NOTE — Telephone Encounter (Signed)
-----   Message from Shona Needles, RT sent at 02/02/2020  4:56 PM EDT ----- Regarding: [place] Patient consented for Enbrel, please apply pending normal labs. Thank you.

## 2020-02-04 NOTE — Progress Notes (Signed)
SPEP WNL.

## 2020-02-04 NOTE — Telephone Encounter (Signed)
Received notification from FEP/ [facility] regarding a prior authorization for ENBREL. Authorization has been APPROVED from 01/05/20 to 02/03/21.   Authorization # [id number]  Ran test claim, patient must fill through [facility]. Patient can use a copay card.

## 2020-02-04 NOTE — Telephone Encounter (Signed)
TB gold still pending.  Will call to discuss starting Enbrel once resulted.   Mariella Saa, PharmD, [place], CPP Clinical Specialty Pharmacist (Rheumatology and Pulmonology)  02/04/2020 10:57 AM

## 2020-02-05 LAB — CBC WITH DIFFERENTIAL/PLATELET
Absolute Monocytes: 561 cells/uL (ref 200–950)
Basophils Absolute: 32 cells/uL (ref 0–200)
Basophils Relative: 0.4 %
Eosinophils Absolute: 150 cells/uL (ref 15–500)
Eosinophils Relative: 1.9 %
HCT: 41.6 % (ref 35.0–45.0)
Hemoglobin: 13.7 g/dL (ref 11.7–15.5)
Lymphs Abs: 1430 cells/uL (ref 850–3900)
MCH: 28.5 pg (ref 27.0–33.0)
MCHC: 32.9 g/dL (ref 32.0–36.0)
MCV: 86.5 fL (ref 80.0–100.0)
MPV: 10.9 fL (ref 7.5–12.5)
Monocytes Relative: 7.1 %
Neutro Abs: 5728 cells/uL (ref 1500–7800)
Neutrophils Relative %: 72.5 %
Platelets: 290 10*3/uL (ref 140–400)
RBC: 4.81 10*6/uL (ref 3.80–5.10)
RDW: 12.3 % (ref 11.0–15.0)
Total Lymphocyte: 18.1 %
WBC: 7.9 10*3/uL (ref 3.8–10.8)

## 2020-02-05 LAB — QUANTIFERON-TB GOLD PLUS
Mitogen-NIL: >10 IU/mL
NIL: 0.02 IU/mL
QuantiFERON-TB Gold Plus: NEGATIVE
TB1-NIL: 0 IU/mL
TB2-NIL: 0 IU/mL

## 2020-02-05 LAB — VITAMIN D 25 HYDROXY (VIT D DEFICIENCY, FRACTURES): Vit D, 25-Hydroxy: 23 ng/mL — ABNORMAL LOW (ref 30–100)

## 2020-02-05 LAB — PROTEIN ELECTROPHORESIS, SERUM, WITH REFLEX
Albumin ELP: 4 g/dL (ref 3.8–4.8)
Alpha 1: 0.3 g/dL (ref 0.2–0.3)
Alpha 2: 0.8 g/dL (ref 0.5–0.9)
Beta 2: 0.4 g/dL (ref 0.2–0.5)
Beta Globulin: 0.5 g/dL (ref 0.4–0.6)
Gamma Globulin: 1.5 g/dL (ref 0.8–1.7)
Total Protein: 7.5 g/dL (ref 6.1–8.1)

## 2020-02-05 NOTE — Progress Notes (Signed)
TB gold negative

## 2020-02-10 NOTE — Telephone Encounter (Signed)
TB gold came back negative.  All other baseline immunosuppressant labs normal.  Chest x-ray completed.  Called to notify patient of approval.  Scheduled new start visit for 02/16/2020 at 10:30 AM.   Mariella Saa, PharmD, BCACP, CPP Clinical Specialty Pharmacist (Rheumatology and Pulmonology)  02/10/2020 9:15 AM

## 2020-02-12 NOTE — Progress Notes (Deleted)
Pharmacy Note  Subjective:   Patient presents to clinic today to receive first dose of Enbrel.  Patient running a fever or have signs/symptoms of infection? {yes/no:20286}  Patient currently on antibiotics for the treatment of infection? {yes/no:20286}  Patient have any upcoming invasive procedures/surgeries? {yes/no:20286}  Objective: CMP     Component Value Date/Time   NA 141 12/25/2019 1526   NA 141 10/12/2019 1432   K 4.3 12/25/2019 1526   CL 108 12/25/2019 1526   CO2 27 12/25/2019 1526   GLUCOSE 122 (H) 12/25/2019 1526   BUN 15 12/25/2019 1526   BUN 11 10/12/2019 1432   CREATININE 0.66 12/25/2019 1526   CALCIUM 9.7 12/25/2019 1526   PROT 7.5 02/02/2020 1631   PROT 7.7 10/12/2019 1432   ALBUMIN 4.6 10/12/2019 1432   AST 12 12/25/2019 1526   ALT 13 12/25/2019 1526   ALKPHOS 76 10/12/2019 1432   BILITOT 0.3 12/25/2019 1526   BILITOT 0.3 10/12/2019 1432   GFRNONAA 100 12/25/2019 1526   GFRAA 116 12/25/2019 1526    CBC    Component Value Date/Time   WBC 7.9 02/02/2020 1631   RBC 4.81 02/02/2020 1631   HGB 13.7 02/02/2020 1631   HGB 14.2 10/12/2019 1432   HCT 41.6 02/02/2020 1631   HCT 41.9 10/12/2019 1432   PLT 290 02/02/2020 1631   PLT 292 10/12/2019 1432   MCV 86.5 02/02/2020 1631   MCV 86 10/12/2019 1432   MCH 28.5 02/02/2020 1631   MCHC 32.9 02/02/2020 1631   RDW 12.3 02/02/2020 1631   RDW 12.1 10/12/2019 1432   LYMPHSABS 1,430 02/02/2020 1631   LYMPHSABS 1.3 10/12/2019 1432   MONOABS 666 01/31/2017 1006   EOSABS 150 02/02/2020 1631   EOSABS 0.1 10/12/2019 1432   BASOSABS 32 02/02/2020 1631   BASOSABS 0.0 10/12/2019 1432    Baseline Immunosuppressant Therapy Labs TB GOLD Quantiferon TB Gold Latest Ref Rng & Units 02/02/2020  Quantiferon TB Gold Plus NEGATIVE NEGATIVE   Hepatitis Panel Hepatitis Latest Ref Rng & Units 01/31/2017  Hep B Surface Ag NEGATIVE NEGATIVE  Hep B IgM NON REACTIVE NON REACTIVE  Hep C Ab NEGATIVE NEGATIVE  Hep A IgM NON  REACTIVE NON REACTIVE   HIV Lab Results  Component Value Date   HIV NONREACTIVE 01/31/2017   Immunoglobulins Immunoglobulin Electrophoresis Latest Ref Rng & Units 01/31/2017  IgG 694 - 1,618 mg/dL 1,992(H)  IgM 48 - 271 mg/dL 136   SPEP Serum Protein Electrophoresis Latest Ref Rng & Units 02/02/2020  Total Protein 6.1 - 8.1 g/dL 7.5  Albumin 3.8 - 4.8 g/dL 4.0  Alpha-1 0.2 - 0.3 g/dL 0.3  Alpha-2 0.5 - 0.9 g/dL 0.8  Beta Globulin 0.4 - 0.6 g/dL 0.5  Beta 2 0.2 - 0.5 g/dL 0.4  Gamma Globulin 0.8 - 1.7 g/dL 1.5  Interpretation - -   G6PD No results found for: G6PDH TPMT Lab Results  Component Value Date   TPMT 17 04/18/2018     Chest x-ray: chronically ILD lung changes with bibasilar scarring  Assessment/Plan:  Demonstrated proper injection technique with Enbrel Mini device.  Patient able to demonstrate proper injection technique using the teach back method. Patient self injected in the **** with:  Sample Medication: Enbrel Mini 50mg /ml NDC: *** Lot: *** Expiration: **  Patient tolerated well.  Observed for 30 mins in office for adverse reaction and ***.   Patient is to return in 1 month for follow up appointment and for labs.  Standing orders placed.  Patient was approved for patient assistance through [employer].  All questions encouraged and answered.  Instructed patient to call with any further questions or concerns.  Mariella Saa, PharmD, [facility] Rheumatology Clinical Pharmacist  02/12/2020 1:06 PM

## 2020-02-15 ENCOUNTER — Other Ambulatory Visit (HOSPITAL_COMMUNITY)
Admission: RE | Admit: 2020-02-15 | Discharge: 2020-02-15 | Disposition: A | Discharge status: Home / Self Care | Payer: Federal, State, Local not specified - PPO | Source: Ambulatory Visit | Attending: Pulmonary Disease | Admitting: Pulmonary Disease

## 2020-02-15 DIAGNOSIS — Z20822 Contact with and (suspected) exposure to covid-19: Secondary | ICD-10-CM | POA: Diagnosis not present

## 2020-02-15 DIAGNOSIS — Z01812 Encounter for preprocedural laboratory examination: Secondary | ICD-10-CM | POA: Insufficient documentation

## 2020-02-15 LAB — SARS CORONAVIRUS 2 (TAT 6-24 HRS): SARS Coronavirus 2: NEGATIVE

## 2020-02-16 ENCOUNTER — Ambulatory Visit: Payer: Federal, State, Local not specified - PPO

## 2020-02-17 ENCOUNTER — Ambulatory Visit (INDEPENDENT_AMBULATORY_CARE_PROVIDER_SITE_OTHER): Payer: Federal, State, Local not specified - PPO | Admitting: Pulmonary Disease

## 2020-02-17 ENCOUNTER — Other Ambulatory Visit: Payer: Self-pay

## 2020-02-17 DIAGNOSIS — J849 Interstitial pulmonary disease, unspecified: Secondary | ICD-10-CM

## 2020-02-17 DIAGNOSIS — R0600 Dyspnea, unspecified: Secondary | ICD-10-CM

## 2020-02-17 DIAGNOSIS — R0609 Other forms of dyspnea: Secondary | ICD-10-CM

## 2020-02-17 LAB — PULMONARY FUNCTION TEST
DL/VA % pred: 125 %
DL/VA: 5.22 ml/min/mmHg/L
DLCO cor % pred: 77 %
DLCO cor: 18.42 ml/min/mmHg
DLCO unc % pred: 78 %
DLCO unc: 18.58 ml/min/mmHg
FEF 25-75 Post: 3.22 L/sec
FEF 25-75 Pre: 2.9 L/sec
FEF2575-%Change-Post: 10 %
FEF2575-%Pred-Post: 124 %
FEF2575-%Pred-Pre: 111 %
FEV1-%Change-Post: 4 %
FEV1-%Pred-Post: 83 %
FEV1-%Pred-Pre: 80 %
FEV1-Post: 2.18 L
FEV1-Pre: 2.09 L
FEV1FVC-%Change-Post: 4 %
FEV1FVC-%Pred-Pre: 107 %
FEV6-%Change-Post: 0 %
FEV6-%Pred-Post: 75 %
FEV6-%Pred-Pre: 76 %
FEV6-Post: 2.42 L
FEV6-Pre: 2.42 L
FEV6FVC-%Pred-Post: 102 %
FEV6FVC-%Pred-Pre: 102 %
FVC-%Change-Post: 0 %
FVC-%Pred-Post: 73 %
FVC-%Pred-Pre: 73 %
FVC-Post: 2.42 L
FVC-Pre: 2.42 L
Post FEV1/FVC ratio: 90 %
Post FEV6/FVC ratio: 100 %
Pre FEV1/FVC ratio: 86 %
Pre FEV6/FVC Ratio: 100 %
RV % pred: 74 %
RV: 1.53 L
TLC % pred: 74 %
TLC: 4.22 L

## 2020-02-17 NOTE — Progress Notes (Signed)
Full PFT performed today. °

## 2020-02-18 ENCOUNTER — Ambulatory Visit (INDEPENDENT_AMBULATORY_CARE_PROVIDER_SITE_OTHER): Payer: Federal, State, Local not specified - PPO | Admitting: Pharmacist

## 2020-02-18 ENCOUNTER — Telehealth: Payer: Self-pay | Admitting: Pulmonary Disease

## 2020-02-18 VITALS — BP 129/74 | HR 71

## 2020-02-18 DIAGNOSIS — Z7189 Other specified counseling: Secondary | ICD-10-CM | POA: Diagnosis not present

## 2020-02-18 DIAGNOSIS — M0579 Rheumatoid arthritis with rheumatoid factor of multiple sites without organ or systems involvement: Secondary | ICD-10-CM

## 2020-02-18 MED ORDER — ENBREL MINI 50 MG/ML ~~LOC~~ SOCT: 50.0000 mg | SUBCUTANEOUS | 0 refills | Status: DC

## 2020-02-18 NOTE — Telephone Encounter (Signed)
Lauraine Rinne, NP  P [facility] Triage Pool Pulmonary function testing today showing some restrictive lung disease, so difficulty taking a full deep breath in as well as a slight diffusion defect which refers to the patients difficulty to process oxygen efficiently. Keep the upcoming appointment in 2 days to further review in person with [name].   [place] and spoke with pt letting her know the results of the PFT and to keep f/u with VS tomorrow 7/16 as he will further discuss results. Pt verbalized understanding. Nothing further needed.

## 2020-02-18 NOTE — Progress Notes (Signed)
Pharmacy Note  Subjective:   Patient presents to clinic today to receive first dose of Enbrel.  Patient running a fever or have signs/symptoms of infection? No  Patient currently on antibiotics for the treatment of infection? No  Patient have any upcoming invasive procedures/surgeries? No  Objective: CMP     Component Value Date/Time   NA 141 12/25/2019 1526   NA 141 10/12/2019 1432   K 4.3 12/25/2019 1526   CL 108 12/25/2019 1526   CO2 27 12/25/2019 1526   GLUCOSE 122 (H) 12/25/2019 1526   BUN 15 12/25/2019 1526   BUN 11 10/12/2019 1432   CREATININE 0.66 12/25/2019 1526   CALCIUM 9.7 12/25/2019 1526   PROT 7.5 02/02/2020 1631   PROT 7.7 10/12/2019 1432   ALBUMIN 4.6 10/12/2019 1432   AST 12 12/25/2019 1526   ALT 13 12/25/2019 1526   ALKPHOS 76 10/12/2019 1432   BILITOT 0.3 12/25/2019 1526   BILITOT 0.3 10/12/2019 1432   GFRNONAA 100 12/25/2019 1526   GFRAA 116 12/25/2019 1526    CBC    Component Value Date/Time   WBC 7.9 02/02/2020 1631   RBC 4.81 02/02/2020 1631   HGB 13.7 02/02/2020 1631   HGB 14.2 10/12/2019 1432   HCT 41.6 02/02/2020 1631   HCT 41.9 10/12/2019 1432   PLT 290 02/02/2020 1631   PLT 292 10/12/2019 1432   MCV 86.5 02/02/2020 1631   MCV 86 10/12/2019 1432   MCH 28.5 02/02/2020 1631   MCHC 32.9 02/02/2020 1631   RDW 12.3 02/02/2020 1631   RDW 12.1 10/12/2019 1432   LYMPHSABS 1,430 02/02/2020 1631   LYMPHSABS 1.3 10/12/2019 1432   MONOABS 666 01/31/2017 1006   EOSABS 150 02/02/2020 1631   EOSABS 0.1 10/12/2019 1432   BASOSABS 32 02/02/2020 1631   BASOSABS 0.0 10/12/2019 1432    Baseline Immunosuppressant Therapy Labs TB GOLD Quantiferon TB Gold Latest Ref Rng & Units 02/02/2020  Quantiferon TB Gold Plus NEGATIVE NEGATIVE   Hepatitis Panel Hepatitis Latest Ref Rng & Units 01/31/2017  Hep B Surface Ag NEGATIVE NEGATIVE  Hep B IgM NON REACTIVE NON REACTIVE  Hep C Ab NEGATIVE NEGATIVE  Hep A IgM NON REACTIVE NON REACTIVE   HIV Lab  Results  Component Value Date   HIV NONREACTIVE 01/31/2017   Immunoglobulins Immunoglobulin Electrophoresis Latest Ref Rng & Units 01/31/2017  IgG 694 - 1,618 mg/dL 1,992(H)  IgM 48 - 271 mg/dL 136   SPEP Serum Protein Electrophoresis Latest Ref Rng & Units 02/02/2020  Total Protein 6.1 - 8.1 g/dL 7.5  Albumin 3.8 - 4.8 g/dL 4.0  Alpha-1 0.2 - 0.3 g/dL 0.3  Alpha-2 0.5 - 0.9 g/dL 0.8  Beta Globulin 0.4 - 0.6 g/dL 0.5  Beta 2 0.2 - 0.5 g/dL 0.4  Gamma Globulin 0.8 - 1.7 g/dL 1.5  Interpretation - -   G6PD No results found for: G6PDH TPMT Lab Results  Component Value Date   TPMT 17 04/18/2018     Chest x-ray: Chronically interstitial lung disease changes with bibasilar Scarring. 12/17/19  Assessment/Plan:  Demonstrated proper injection technique with improvement knee demo pen.  Patient able to demonstrate proper injection technique using the teach back method.  Patient self injected in the lower left abdomen with:  Sample Medication: Enbrel Mini 50 mg/ml NDC: 72536-644-03 Lot: [id number] Expiration: 04/23  Patient tolerated well.  Observed for 30 mins in office for adverse reaction and none noted.   Patient is to return in 1 month for follow up  appointment and for labs.  Standing orders placed. Prescription [facility] per insurance.  Patient has Pharmacist, community and his co-pay card eligible.  Patient given brochure with information on how to sign up for co-pay card.  Patient was given 2 samples with the same lot and expiration as above in case of shipping delays.   All questions encouraged and answered.  Instructed patient to call with any further questions or concerns.   Mariella Saa, PharmD, [place], CPP Clinical Specialty Pharmacist (Rheumatology and Pulmonology)  02/18/2020 11:07 AM

## 2020-02-18 NOTE — Patient Instructions (Signed)
·   START Enbrel 50 mg every 7 days  SIGN UP for Enbrel co-pay card  CALL [facility] to set up first shipment if they have not called by Monday.  Remember the 5 C's:  COUNTER- leave on the counter at least 30 mins but up to overnight to bring medication to room temperature and prevent stinging  COLD- Placing something cold (like and ice gel pack or cold water bottle) on the injection site just before cleansing with alcohol may help reduce pain  CLARITIN- for the first two weeks of treatment or  the day of, the day before, and the day after injecting to minimize injection site reactions  CORTISONE CREAM- apply if injection site is irritated and itching  CALL ME- if injection site reaction is bigger than the size of your fist, looks infected, blisters, or develop hives  Standing Labs We placed an order today for your standing lab work.   Please have your standing labs drawn in 1 month and then every 3 months.  If possible, please have your labs drawn 2 weeks prior to your appointment so that the provider can discuss your results at your appointment.  We have open lab daily Monday through Thursday from 8:30-12:30 PM and 1:30-4:30 PM and Friday from 8:30-12:30 PM and 1:30-4:00 PM at the office of Dr. Bo Merino, [facility].   Please be advised, patients with office appointments requiring lab work will take precedents over walk-in lab work.  If possible, please come for your lab work on Monday and Friday afternoons, as you may experience shorter wait times. The office is located at [address] [place], [place], [place] [address] No appointment is necessary.   Labs are drawn by [facility]. Please bring your co-pay at the time of your lab draw.  You may receive a bill from [facility] for your lab work.  If you wish to have your labs drawn at another location, please call the office 24 hours in advance to send orders.  If you have any questions regarding  directions or hours of operation,  please call [phone].   As a reminder, please drink plenty of water prior to coming for your lab work. Thanks!

## 2020-02-19 ENCOUNTER — Other Ambulatory Visit: Payer: Self-pay

## 2020-02-19 ENCOUNTER — Encounter: Payer: Self-pay | Admitting: Pulmonary Disease

## 2020-02-19 ENCOUNTER — Ambulatory Visit: Payer: Federal, State, Local not specified - PPO | Admitting: Pulmonary Disease

## 2020-02-19 VITALS — BP 130/80 | HR 76 | Temp 98.3°F | Ht 68.0 in | Wt 273.6 lb

## 2020-02-19 DIAGNOSIS — G4733 Obstructive sleep apnea (adult) (pediatric): Secondary | ICD-10-CM

## 2020-02-19 DIAGNOSIS — M3320 Polymyositis, organ involvement unspecified: Secondary | ICD-10-CM

## 2020-02-19 DIAGNOSIS — J479 Bronchiectasis, uncomplicated: Secondary | ICD-10-CM | POA: Diagnosis not present

## 2020-02-19 DIAGNOSIS — M069 Rheumatoid arthritis, unspecified: Secondary | ICD-10-CM

## 2020-02-19 DIAGNOSIS — J849 Interstitial pulmonary disease, unspecified: Secondary | ICD-10-CM | POA: Diagnosis not present

## 2020-02-19 DIAGNOSIS — G4734 Idiopathic sleep related nonobstructive alveolar hypoventilation: Secondary | ICD-10-CM

## 2020-02-19 NOTE — Telephone Encounter (Signed)
I called the patient to discuss the work note requested by her employer.  I discussed that we cannot provide a letter with her medical diagnoses /medications due to [place].   As stated in the initial note, we highly recommend that she works from home due to her underlying conditions.

## 2020-02-19 NOTE — Progress Notes (Signed)
[facility]  Chief Complaint  Patient presents with  . Follow-up    PFT results.  heat affecting breathing.  working from home.  more sob walking shorter distances.  sob with exertion.  CPAP with oxygen, wearing nightly, does not feel like getting enough oxygen.  twice lst 2 weeks woke up coughing.    Constitutional:  BP 130/80 (BP Location: Left Arm, Cuff Size: Large)   Pulse 76   Temp 98.3 F (36.8 C) (Oral)   Ht 5' 8"  (1.727 m)   Wt 273 lb 9.6 oz (124.1 kg)   SpO2 100%   BMI 41.60 kg/m   Past Medical History:  Fibromyalgia, Migraine HA, MVP, OA, Fibromuscular dysplasia, Erythema nodosum, Vit D deficiency  Brief Summary:  Jill Harper is a 54 y.o. female with bronchiectasis in the setting of seropositive rheumatoid arthritis and polymyositis.  Subjective:   She has noticed trouble with her breathing for a while.  Also having more joint swelling.  All this got worse after COVID vaccine.  Seen by rheumatology recently.  Starting on enbrel in addition to continuing imuran.  Gets winded with talking and activity.  Has trouble lifting things (needs to use two hands to take milk out of refrigerator).  Has occasional cough.  Not bringing up much sputum.  Denies fever, hemoptysis.  Has intermittent episodes at night where she feels like CPAP pressure is too high and feels smothered.  Seems to happen when she has mask leaking.  PFT from 02/17/20 showed mild restrictive defect.  Similar to previous studies.  Physical Exam:   Appearance - well kempt   ENMT - no sinus tenderness, no oral exudate, no LAN, Mallampati 3 airway, no stridor  Respiratory - equal breath sounds bilaterally, no wheezing or rales  CV - s1s2 regular rate and rhythm, no murmurs  Ext - no clubbing, no edema  Skin - no rashes  Psych - normal mood and affect   Assessment/Plan:   ILD and Bronchiectasis in setting of RA and polymyositis. - followed by Dr.  Estanislado Pandy - maintained on imuran and adding enbrel - will repeat HRCT chest and 6 MWT - continue breo, and prn albuterol - prn mucinex, flutter valve  Obstructive sleep apnea. - she is compliant with CPAP - continue auto CPAP - recent episodes likely related to mask leak  Nocturnal hypoxemia. - continue 2 liters oxygen 2 liters at night with CPAP  A total of  32 minutes spent addressing patient care issues on day of visit.   Follow up:   Patient Instructions  Will arrange for 6 minute walk test and high resolution CT chest  Follow up in 4 weeks with [name] or [name]   Signature:  Chesley Mires, MD [place] Pager: [phone] 02/19/2020, 12:23 PM  Flow Sheet    Pulmonary tests:   PFT 10/30/11 >> FEV1 3.31 (114%), FEV1% 85, TLC 6.06 (108%), DLCO 77%, no BD  PFT 11/18/12 >> FEV1 3.18 (111%), FEV1% 84, TLC 5.34 (95%), DLCO 67%, no BD  PFT 12/02/15 >> FEV1 2.96 (108%), FEV1% 92, TLC 4.95 (86%), DLCO 65%, no BD  PFT 04/17/18 >> FEV1 2.46 (101%), FEV1% 90, DLCO 76%  PFT 02/05/19 >> FEV1 2.30 (87%), FEV1% 89, TLC 4.21 (74%), DLCO 81%, no BD  PFT 02/17/20 >> FEV1 2.18 (83%), FEV1% 90, TLC 4.22 (74%), DLCO 78%  Chest imaging:   CT chest 03/23/05 >> patchy b/l lower lung ASD with cylindrical BTX  CT chest 02/17/08 >> peripheral and basilar predominant subpleural GGO  CT chest 12/01/08 >> no change  HRCT chest 09/07/15 >> scattered GGO  HRCT chest 04/15/18 >> patchy confluent subpleural and peripheral peribronchovascular reticulation and ground-glass opacity throughout both lungs with a strong basilar gradient, traction BTX  Labs:   Quantiferon gold 01/31/17 >> negative  Serology 01/31/17 >> RF 29, CCP 213, Jo-1 > 8; ANA, SCL 70, SSA/SSB, ENA Sm negative  Quantiferon gold 01/31/17 >> negative  Ig 01/31/17 >> IgG 1992, IgA 147, IgM 136  Sleep testing:   HST 07/28/18 >> AHI 5, SpO2 low 83%  ONO with CPAP 11/05/18 >> test time 6 hrs 24 min,  baseline SpO2 90%, low SpO2 80%; spent 1 hr 15 min with SpO2 < 88%  Auto CPAP 02/15/19 to 03/16/19 >> used on 30 of 30 nights with average 7 hrs 41 min.  Average AHI 1.2 with median CPAP 6 and 95 th percentile CPAP 8 cm H2O  Cardiac tests:   Echo 05/23/18 >> EF 60 to 65%, grade 1 DD, mild MR   Medications:   Allergies as of 02/19/2020      Reactions   Sulfonamide Derivatives    REACTION: hives      Medication List       Accurate as of February 19, 2020 12:23 PM. If you have any questions, ask your nurse or doctor.        Ajovy 225 MG/1.5ML Soaj Generic drug: Fremanezumab-vfrm Inject 225 mg into the skin every 30 (thirty) days.   albuterol 108 (90 Base) MCG/ACT inhaler Commonly known as: Proventil HFA Inhale 2 puffs into the lungs 4 (four) times daily as needed. Reported on 09/03/2015   albuterol (2.5 MG/3ML) 0.083% nebulizer solution Commonly known as: PROVENTIL Take 3 mLs (2.5 mg total) by nebulization every 6 (six) hours as needed for wheezing or shortness of breath.   aspirin EC 81 MG tablet Take 81 mg by mouth daily.   atenolol 25 MG tablet Commonly known as: TENORMIN TAKE 1/2 TABLET BY MOUTH DAILY AS NEEDED FOR MITRAL PROLAPSE   azaTHIOprine 50 MG tablet Commonly known as: IMURAN TAKE 2 TABLETS(100 MG) BY MOUTH DAILY   Breo Ellipta 100-25 MCG/INH Aepb Generic drug: fluticasone furoate-vilanterol INHALE 1 PUFF INTO THE LUNGS DAILY   diclofenac Sodium 1 % Gel Commonly known as: VOLTAREN Apply 2-4 grams to affected joint 4 times daily as needed.   eletriptan 20 MG tablet Commonly known as: Relpax Take 1 tablet (20 mg total) by mouth as needed for migraine.   Enbrel Mini 50 MG/ML Soct Generic drug: Etanercept Inject 50 mg into the skin once a week.   Flutter Devi Use as directed What changed:   when to take this  reasons to take this   folic acid 1 MG tablet Commonly known as: FOLVITE TAKE 2 TABLETS(2 MG) BY MOUTH DAILY   guaiFENesin 600 MG 12 hr  tablet Commonly known as: Mucinex Take 2 tablets (1,200 mg total) by mouth 2 (two) times daily as needed for cough or to loosen phlegm. What changed: when to take this   metFORMIN 500 MG tablet Commonly known as: GLUCOPHAGE TAKE 1 TABLET(500 MG) BY MOUTH TWICE DAILY WITH A MEAL   MULTIVITAMIN GUMMIES ADULT PO Take 2 each by mouth daily.   telmisartan 20 MG tablet Commonly known as: MICARDIS TAKE 1 TABLET(20 MG) BY MOUTH DAILY   Vitamin D (Ergocalciferol) 1.25 MG (50000 UNIT) Caps capsule Commonly known as: DRISDOL Take 1  capsule (50,000 Units total) by mouth every 7 (seven) days.       Past Surgical History:  She  has a past surgical history that includes Cardiac catheterization (2002); Dilation and curettage of uterus (08-31-08); ablation uterine (2010); IR ANGIO VERTEBRAL SEL VERTEBRAL BILAT MOD SED (09/18/2018); IR ANGIO INTRA EXTRACRAN SEL COM CAROTID INNOMINATE BILAT MOD SED (09/18/2018); and IR US Guide Vasc Access Right (09/18/2018).  Family History:  Her family history includes Diabetes in her brother, mother, sister, and sister; Fibromyalgia in her mother; Headache in an other family member; Heart attack in her sister; Heart disease in her brother; Heart failure in her mother; Hypertension in her father, sister, and sister; Lupus in her sister; Migraines in her sister; Multiple myeloma in her father and sister; Obesity in her mother; Other in her sister; Rheum arthritis in her sister and sister; Stroke in her father; Thyroid disease in her mother; Ulcers in her mother.  Social History:  She  reports that she has never smoked. She has never used smokeless tobacco. She reports previous alcohol use. She reports that she does not use drugs.

## 2020-02-19 NOTE — Patient Instructions (Signed)
Will arrange for 6 minute walk test and high resolution CT chest  Follow up in 4 weeks with [name] or [name]

## 2020-02-26 DIAGNOSIS — G4733 Obstructive sleep apnea (adult) (pediatric): Secondary | ICD-10-CM | POA: Diagnosis not present

## 2020-02-26 DIAGNOSIS — J471 Bronchiectasis with (acute) exacerbation: Secondary | ICD-10-CM | POA: Diagnosis not present

## 2020-03-01 ENCOUNTER — Other Ambulatory Visit: Payer: Self-pay

## 2020-03-01 ENCOUNTER — Ambulatory Visit (INDEPENDENT_AMBULATORY_CARE_PROVIDER_SITE_OTHER)
Admission: RE | Admit: 2020-03-01 | Discharge: 2020-03-01 | Disposition: A | Discharge status: Home / Self Care | Payer: Federal, State, Local not specified - PPO | Source: Ambulatory Visit | Attending: Pulmonary Disease | Admitting: Pulmonary Disease

## 2020-03-01 DIAGNOSIS — J849 Interstitial pulmonary disease, unspecified: Secondary | ICD-10-CM | POA: Diagnosis not present

## 2020-03-01 DIAGNOSIS — J984 Other disorders of lung: Secondary | ICD-10-CM | POA: Diagnosis not present

## 2020-03-01 NOTE — Progress Notes (Deleted)
Office Visit Note  Patient: Jill Harper             Date of Birth: 06-07-1966           MRN: [id number]             PCP: Binnie Rail, MD Referring: Binnie Rail, MD Visit Date: 03/15/2020 Occupation: @GUAROCC @  Subjective:  No chief complaint on file.   History of Present Illness: Jill Harper is a 54 y.o. female ***   Activities of Daily Living:  Patient reports morning stiffness for *** {minute/hour:19697}.   Patient {ACTIONS;DENIES/REPORTS:21021675::"Denies"} nocturnal pain.  Difficulty dressing/grooming: {ACTIONS;DENIES/REPORTS:21021675::"Denies"} Difficulty climbing stairs: {ACTIONS;DENIES/REPORTS:21021675::"Denies"} Difficulty getting out of chair: {ACTIONS;DENIES/REPORTS:21021675::"Denies"} Difficulty using hands for taps, buttons, cutlery, and/or writing: {ACTIONS;DENIES/REPORTS:21021675::"Denies"}  No Rheumatology ROS completed.   PMFS History:  Patient Active Problem List   Diagnosis Date Noted  . Occipital neuralgia of left side 01/06/2020  . Bilateral carpal tunnel syndrome 07/21/2019  . Costochondritis 03/16/2019  . Rib pain on right side 03/16/2019  . Insulin resistance 11/27/2018  . Class 3 severe obesity with serious comorbidity and body mass index (BMI) of 40.0 to 44.9 in adult [facility]) 11/27/2018  . Fibromuscular dysplasia ([place]) 10/17/2018  . Hypertension 10/17/2018  . Bronchiectasis with (acute) exacerbation ([place]) 10/08/2018  . OSA (obstructive sleep apnea) 08/20/2018  . Rheumatoid arthritis ([place]) 04/17/2018  . ILD (interstitial lung disease) ([place]) 04/17/2018  . Dyspnea 04/01/2018  . Bronchiectasis ([place]) 09/02/2017  . LVH (left ventricular hypertrophy) 06/02/2017  . Mitral regurgitation 05/22/2017  . Jo-1 antibody positive 01/29/2017  . Primary osteoarthritis of both feet 01/29/2017  . Primary osteoarthritis of both hands 01/29/2017  . Left shoulder tendonitis 01/17/2017  . Vitamin D deficiency 04/26/2016  . Cough 07/18/2015  . MVP (mitral  valve prolapse)   . Erythema nodosum   . INCONTINENCE, URGE 05/26/2010  . Migraine headache 02/15/2010  . Fibromyalgia 06/20/2009  . Polymyositis ([place]) 02/11/2008  . BRONCHIECTASIS 02/10/2008    Past Medical History:  Diagnosis Date  . Abnormal liver function test   . Bronchiectasis       . Dyspnea   . Fibromuscular dysplasia ([place])   . Fibromyalgia   . Heart murmur   . ILD (interstitial lung disease) ([place])   . Joint pain   . Menorrhagia   . MIGRAINE HEADACHE   . MVP (mitral valve prolapse)   . Osteoarthritis   . Polymyositis ([place])    Dr [name]; chronic MTX  . Pulmonary fibrosis ([place])   . Rheumatoid arthritis ([place])   . Sleep apnea   . Vitamin B 12 deficiency   . Vitamin D deficiency     Family History  Problem Relation Age of Onset  . Diabetes Mother   . Fibromyalgia Mother   . Ulcers Mother   . Heart failure Mother   . Thyroid disease Mother   . Obesity Mother   . Multiple myeloma Father   . Hypertension Father   . Stroke Father   . Lupus Sister   . Other Sister        abdominal adhesions resulting in bowel obstruction  . Migraines Sister   . Heart disease Brother        bypass surgery  . Rheum arthritis Sister   . Multiple myeloma Sister   . Hypertension Sister   . Heart attack Sister   . Diabetes Sister   . Hypertension Sister   . Rheum arthritis Sister   . Diabetes Sister   .  Diabetes Brother   . Headache Other        siblings with headaches but not diagnosed as migraines   Past Surgical History:  Procedure Laterality Date  . ablation uterine  2010  . CARDIAC CATHETERIZATION  2002   normal  . DILATION AND CURETTAGE OF UTERUS  08-31-08   Dr [name]  . IR ANGIO INTRA EXTRACRAN SEL COM CAROTID INNOMINATE BILAT MOD SED  09/18/2018  . IR ANGIO VERTEBRAL SEL VERTEBRAL BILAT MOD SED  09/18/2018  . IR US GUIDE VASC ACCESS RIGHT  09/18/2018   Social History   Social History Narrative   Exercise: trying to walk - limited by fatigue    Lives at home with husband    Right handed   Caffeine: occasional coke 1-2x per month   Immunization History  Administered Date(s) Administered  . Influenza Split 04/10/2011, 06/18/2012, 05/06/2013  . Influenza Whole 05/19/2008, 05/15/2009, 06/01/2010  . Influenza,inj,Quad PF,6+ Mos 04/20/2014, 05/26/2015, 05/31/2016, 04/05/2017, 06/16/2018, 04/22/2019  . PFIZER SARS-COV-2 Vaccination 10/27/2019, 11/19/2019  . Pneumococcal Conjugate-13 01/29/2017  . Pneumococcal Polysaccharide-23 08/07/2007, 06/16/2018  . Td 07/12/2009  . Tdap 01/06/2020     Objective: Vital Signs: There were no vitals taken for this visit.   Physical Exam   Musculoskeletal Exam: ***  CDAI Exam: CDAI Score: -- Patient Global: --; Provider Global: -- Swollen: --; Tender: -- Joint Exam 03/15/2020   No joint exam has been documented for this visit   There is currently no information documented on the homunculus. Go to the Rheumatology activity and complete the homunculus joint exam.  Investigation: No additional findings.  Imaging: No results found.  Recent Labs: Lab Results  Component Value Date   WBC 7.9 02/02/2020   HGB 13.7 02/02/2020   PLT 290 02/02/2020   NA 141 12/25/2019   K 4.3 12/25/2019   CL 108 12/25/2019   CO2 27 12/25/2019   GLUCOSE 122 (H) 12/25/2019   BUN 15 12/25/2019   CREATININE 0.66 12/25/2019   BILITOT 0.3 12/25/2019   ALKPHOS 76 10/12/2019   AST 12 12/25/2019   ALT 13 12/25/2019   PROT 7.5 02/02/2020   ALBUMIN 4.6 10/12/2019   CALCIUM 9.7 12/25/2019   GFRAA 116 12/25/2019   QFTBGOLDPLUS NEGATIVE 02/02/2020    Speciality Comments: Prior therapy includes: MTX (d/c due to ILD progresssion)  Procedures:  No procedures performed Allergies: Sulfonamide derivatives   Assessment / Plan:     Visit Diagnoses: No diagnosis found.  Orders: No orders of the defined types were placed in this encounter.  No orders of the defined types were placed in this  encounter.   Face-to-face time spent with patient was *** minutes. Greater than 50% of time was spent in counseling and coordination of care.  Follow-Up Instructions: No follow-ups on file.   Ofilia Neas, PA-C  Note - This record has been created using Dragon software.  Chart creation errors have been sought, but may not always  have been located. Such creation errors do not reflect on  the standard of medical care.

## 2020-03-09 DIAGNOSIS — G5622 Lesion of ulnar nerve, left upper limb: Secondary | ICD-10-CM | POA: Diagnosis not present

## 2020-03-09 DIAGNOSIS — G5601 Carpal tunnel syndrome, right upper limb: Secondary | ICD-10-CM | POA: Diagnosis not present

## 2020-03-11 NOTE — Progress Notes (Signed)
Office Visit Note  Patient: Jill Harper             Date of Birth: July 29, 1966           MRN: [id number]             PCP: Binnie Rail, MD Referring: Binnie Rail, MD Visit Date: 03/23/2020 Occupation: @GUAROCC @  Subjective:  Medication monitoring   History of Present Illness: Jill Harper is a 54 y.o. female with history of polymyositis, rheumatoid arthritis, and ILD.  She is currently on Enbrel 50 mg sq injections once weekly and Imuran 100 mg daily.  She was started on Enbrel on 02/18/20.  She has started to notice some improvement since starting on Enbrel.  She states that she continues to have some good days and bad days.  She has persistent pain and inflammation in the right first PIP joint.  She continues to experience occasional nocturnal pain and stiffness in both shoulder joints in both knee joints. Patient reports that she had appointment with her pulmonologist yesterday.  They were planning on proceeding with pulmonary rehab but she would like to hold off due to the COVID-19 pandemic.  She has received both COVID-19 vaccinations.  She is planning on receiving a booster vaccine in the future.  Activities of Daily Living:  Patient reports morning stiffness for 3 hours.   Patient Reports nocturnal pain.  Difficulty dressing/grooming: Denies Difficulty climbing stairs: Denies Difficulty getting out of chair: Denies Difficulty using hands for taps, buttons, cutlery, and/or writing: Reports  Review of Systems  Constitutional: Positive for fatigue.  HENT: Negative for mouth sores, mouth dryness and nose dryness.   Eyes: Negative for pain, visual disturbance and dryness.  Respiratory: Positive for shortness of breath (Hx of ILD ). Negative for cough, hemoptysis and difficulty breathing.   Cardiovascular: Negative for chest pain, palpitations, hypertension and swelling in legs/feet.  Gastrointestinal: Negative for blood in stool, constipation and diarrhea.  Endocrine: Negative  for increased urination.  Genitourinary: Negative for painful urination.  Musculoskeletal: Positive for arthralgias, joint pain, joint swelling, morning stiffness and muscle tenderness. Negative for myalgias, muscle weakness and myalgias.  Skin: Negative for color change, pallor, rash, hair loss, nodules/bumps, skin tightness, ulcers and sensitivity to sunlight.  Allergic/Immunologic: Negative for susceptible to infections.  Neurological: Negative for dizziness, numbness, headaches and weakness.  Hematological: Negative for swollen glands.  Psychiatric/Behavioral: Positive for sleep disturbance. Negative for depressed mood. The patient is not nervous/anxious.     PMFS History:  Patient Active Problem List   Diagnosis Date Noted  . Occipital neuralgia of left side 01/06/2020  . Bilateral carpal tunnel syndrome 07/21/2019  . Costochondritis 03/16/2019  . Rib pain on right side 03/16/2019  . Insulin resistance 11/27/2018  . Class 3 severe obesity with serious comorbidity and body mass index (BMI) of 40.0 to 44.9 in adult [facility]) 11/27/2018  . Fibromuscular dysplasia ([place]) 10/17/2018  . Hypertension 10/17/2018  . Bronchiectasis with (acute) exacerbation ([place]) 10/08/2018  . OSA (obstructive sleep apnea) 08/20/2018  . Rheumatoid arthritis ([place]) 04/17/2018  . ILD (interstitial lung disease) ([place]) 04/17/2018  . Dyspnea 04/01/2018  . Bronchiectasis ([place]) 09/02/2017  . LVH (left ventricular hypertrophy) 06/02/2017  . Mitral regurgitation 05/22/2017  . Jo-1 antibody positive 01/29/2017  . Primary osteoarthritis of both feet 01/29/2017  . Primary osteoarthritis of both hands 01/29/2017  . Left shoulder tendonitis 01/17/2017  . Vitamin D deficiency 04/26/2016  . Cough 07/18/2015  . MVP (mitral valve prolapse)   .  Erythema nodosum   . INCONTINENCE, URGE 05/26/2010  . Migraine headache 02/15/2010  . Fibromyalgia 06/20/2009  . Polymyositis ([place]) 02/11/2008  . BRONCHIECTASIS 02/10/2008      Past Medical History:  Diagnosis Date  . Abnormal liver function test   . Bronchiectasis       . Dyspnea   . Fibromuscular dysplasia ([place])   . Fibromyalgia   . Heart murmur   . ILD (interstitial lung disease) ([place])   . Joint pain   . Menorrhagia   . MIGRAINE HEADACHE   . MVP (mitral valve prolapse)   . Osteoarthritis   . Polymyositis ([place])    Dr [name]; chronic MTX  . Pulmonary fibrosis ([place])   . Rheumatoid arthritis ([place])   . Sleep apnea   . Vitamin B 12 deficiency   . Vitamin D deficiency     Family History  Problem Relation Age of Onset  . Diabetes Mother   . Fibromyalgia Mother   . Ulcers Mother   . Heart failure Mother   . Thyroid disease Mother   . Obesity Mother   . Multiple myeloma Father   . Hypertension Father   . Stroke Father   . Lupus Sister   . Other Sister        abdominal adhesions resulting in bowel obstruction  . Migraines Sister   . Heart disease Brother        bypass surgery  . Rheum arthritis Sister   . Multiple myeloma Sister   . Hypertension Sister   . Heart attack Sister   . Diabetes Sister   . Hypertension Sister   . Rheum arthritis Sister   . Diabetes Sister   . Diabetes Brother   . Headache Other        siblings with headaches but not diagnosed as migraines   Past Surgical History:  Procedure Laterality Date  . ablation uterine  2010  . CARDIAC CATHETERIZATION  2002   normal  . DILATION AND CURETTAGE OF UTERUS  08-31-08   Dr [name]  . IR ANGIO INTRA EXTRACRAN SEL COM CAROTID INNOMINATE BILAT MOD SED  09/18/2018  . IR ANGIO VERTEBRAL SEL VERTEBRAL BILAT MOD SED  09/18/2018  . IR US GUIDE VASC ACCESS RIGHT  09/18/2018   Social History   Social History Narrative   Exercise: trying to walk - limited by fatigue   Lives at home with husband    Right handed   Caffeine: occasional coke 1-2x per month   Immunization History  Administered Date(s) Administered  . Influenza Split 04/10/2011, 06/18/2012, 05/06/2013   . Influenza Whole 05/19/2008, 05/15/2009, 06/01/2010  . Influenza,inj,Quad PF,6+ Mos 04/20/2014, 05/26/2015, 05/31/2016, 04/05/2017, 06/16/2018, 04/22/2019  . PFIZER SARS-COV-2 Vaccination 10/27/2019, 11/19/2019  . Pneumococcal Conjugate-13 01/29/2017  . Pneumococcal Polysaccharide-23 08/07/2007, 06/16/2018  . Td 07/12/2009  . Tdap 01/06/2020     Objective: Vital Signs: BP 128/82 (BP Location: Left Arm, Patient Position: Sitting, Cuff Size: Normal)   Pulse 65   Resp 17   Ht 5' 8"  (1.727 m)   Wt 274 lb (124.3 kg)   BMI 41.66 kg/m    Physical Exam Vitals and nursing note reviewed.  Constitutional:      Appearance: She is well-developed.  HENT:     Head: Normocephalic and atraumatic.  Eyes:     Conjunctiva/sclera: Conjunctivae normal.  Pulmonary:     Effort: Pulmonary effort is normal.  Abdominal:     General: Bowel sounds are normal.  Palpations: Abdomen is soft.  Musculoskeletal:     Cervical back: Normal range of motion.  Lymphadenopathy:     Cervical: No cervical adenopathy.  Skin:    General: Skin is warm and dry.     Capillary Refill: Capillary refill takes less than 2 seconds.  Neurological:     Mental Status: She is alert and oriented to person, place, and time.  Psychiatric:        Behavior: Behavior normal.      Musculoskeletal Exam: C-spine, thoracic spine, lumbar spine have good range of motion.  Shoulder joints have good range of motion with some discomfort and stiffness bilaterally.  Elbow joints, wrist joints, MCPs, PIPs, DIPs good range of motion with no discomfort.  She has tenderness and synovitis of the right first PIP joint.  Complete fist formation bilaterally.  Hip joints have good range of motion with discomfort bilaterally.  Tenderness palpation over bilateral trochanteric bursa.  Knee joints have good range of motion with no warmth or effusion.  Ankle joints have good range of motion with no tenderness.  She has pedal edema bilaterally.  No  tenderness of MTP joints  CDAI Exam: CDAI Score: 3.3  Patient Global: 7 mm; Provider Global: 6 mm Swollen: 1 ; Tender: 1  Joint Exam 03/23/2020      Right  Left  IP  Swollen Tender        Investigation: No additional findings.  Imaging: CT Chest High Resolution  Result Date: 03/02/2020 CLINICAL DATA:  54 year old female with history of increasing shortness of breath with exertion. Evaluate for interstitial lung disease. History of rheumatoid arthritis. EXAM: CT CHEST WITHOUT CONTRAST TECHNIQUE: Multidetector CT imaging of the chest was performed following the standard protocol without intravenous contrast. High resolution imaging of the lungs, as well as inspiratory and expiratory imaging, was performed. COMPARISON:  Cardiac CT 07/18/2018. FINDINGS: Cardiovascular: Heart size is normal. There is no significant pericardial fluid, thickening or pericardial calcification. No atherosclerotic calcifications in the thoracic aorta or the coronary arteries. Mediastinum/Nodes: No pathologically enlarged mediastinal or hilar lymph nodes. Please note that accurate exclusion of hilar adenopathy is limited on noncontrast CT scans. Esophagus is unremarkable in appearance. No axillary lymphadenopathy. Lungs/Pleura: High-resolution images demonstrate widespread but patchy areas of ground-glass attenuation, septal thickening, mild cylindrical bronchiectasis, thickening of the peribronchovascular interstitium and regional architectural distortion. These findings have a definitive craniocaudal gradient and appear mildly progressive compared to the prior examination. No frank honeycombing. Inspiratory and expiratory imaging demonstrates some mild air trapping indicative of mild small airways disease. No acute consolidative airspace disease. No pleural effusions. No definite suspicious appearing pulmonary nodules or masses are noted. Upper Abdomen: 4 mm cortical calcification in the upper pole the right kidney.  Musculoskeletal: There are no aggressive appearing lytic or blastic lesions noted in the visualized portions of the skeleton. IMPRESSION: 1. The appearance of the lungs is compatible with interstitial lung disease, with a spectrum of findings that is categorized as probable usual interstitial pneumonia (UIP) per current ATS guidelines. Electronically Signed   By: Vinnie Langton M.D.   On: 03/02/2020 09:26    Recent Labs: Lab Results  Component Value Date   WBC 8.5 03/17/2020   HGB 13.4 03/17/2020   PLT 260 03/17/2020   NA 138 03/17/2020   K 4.5 03/17/2020   CL 107 03/17/2020   CO2 24 03/17/2020   GLUCOSE 96 03/17/2020   BUN 19 03/17/2020   CREATININE 0.66 03/17/2020   BILITOT 0.3 03/17/2020  ALKPHOS 76 10/12/2019   AST 14 03/17/2020   ALT 13 03/17/2020   PROT 7.5 03/17/2020   ALBUMIN 4.6 10/12/2019   CALCIUM 9.7 03/17/2020   GFRAA 116 03/17/2020   QFTBGOLDPLUS NEGATIVE 02/02/2020    Speciality Comments: Prior therapy includes: MTX (d/c due to ILD progresssion)  Procedures:  No procedures performed Allergies: Sulfonamide derivatives   Assessment / Plan:     Visit Diagnoses: Rheumatoid arthritis involving multiple sites with positive rheumatoid factor (HCC) - +RF, +CCP: She has persistent tenderness and synovitis of the right first PIP joint.  She has been experiencing intermittent arthralgias and stiffness in both shoulders, right hand, both hips, and both knee joints.  She has good range of motion of both knee joints on exam with no warmth or effusion.  She is currently on Enbrel 50 mg subcutaneous injections once weekly and Imuran 100 mg by mouth daily.  She was started on Enbrel on 02/18/2020.  We discussed that she will require more time on Enbrel to see her for response.  She will continue taking Imuran as prescribed.  We will reassess her response in 3 months.  She was advised to notify us if she develops increased joint pain or joint swelling in the meantime  Polymyositis  ([place]) - Dx by Dr. Charlestine Night 2007, CK 801, Jo-1 positive: She is not experiencing any increased muscle weakness or muscle tenderness at this time.  She has no difficulty rising from a seated position or raising her arms above her head.  She is clinically doing well on Imuran 100 mg by mouth daily.  Her CK was 46 on 03/17/2020.  We will continue to monitor her CK every 3 months.   High risk medication use - Enbrel 50 mg sq injections once weekly (02/18/20-1st injection). Imuran 100 mg by mouth daily. d/c MXT due to progressioin of ILD.  CBC and CMP were drawn on 03/17/2020.  She will be due to update lab work in November and every 3 months to monitor for drug toxicity.  TB gold was negative on 02/02/2020. She has received both COVID-19 vaccinations.  She was encouraged to receive the booster vaccine.  She is also encouraged to continue to wear a mask and social distance.  She was advised to notify us if she develops signs or symptoms of the COVID-19 infection.  She is to hold Imuran and Enbrel if she develops signs or symptoms of an infection.  She will also require the Covid antibody infusion.  Jo-1 antibody positive  ILD (interstitial lung disease) (HCC) - [name]: She was evaluated by her pulmonologist yesterday.  She had updated PFTs and 6 minute walk test yesterday which showed a decline in FVC with stability in lung functioning as well as predicted values.  High-resolution chest CT recently completed shows mild progression from 2019.  She will have repeat PFTs in January 2022.  Pulmonary rehab was recommended but the patient declined due to the COVID-19 pandemic.  She will reconsider in several months at her follow-up visit.  Bronchiectasis without complication ([place]): Followed by pulmonology.  Bilateral carpal tunnel syndrome: She is followed by Dr. Fredna Dow.  She is planning surgery late fall early winter.  Primary osteoarthritis of both hands: She continues to experience stiffness and occasional  discomfort in both hands.  She has tenderness and synovitis of the right first PIP joint.  Joint protection and muscle strengthening were discussed.  She was encouraged to continue performing hand exercises on a daily basis and use Voltaren  gel topically as needed.  Primary osteoarthritis of both feet: She is not experiencing any discomfort in her feet at this time.  She wears proper fitting shoes.  Fibromyalgia: She experiences intermittent myalgias and muscle tenderness due to underlying fibromyalgia.  She continues to have chronic fatigue which has been stable.  She experiences interrupted sleep at night due to nocturnal pain.  She occasionally experiences stiffness and nocturnal pain in both shoulder joints.  We discussed the importance of regular exercise and good sleep hygiene.  Other fatigue: Chronic but stable.  Other medical conditions are listed as follows:  Fibromuscular dysplasia (HCC)  Vitamin D deficiency  History of migraine  Erythema nodosum  History of mitral valve prolapse  Orders: No orders of the defined types were placed in this encounter.  Meds ordered this encounter  Medications  . Etanercept (ENBREL MINI) 50 MG/ML SOCT    Sig: Inject 50 mg into the skin once a week.    Dispense:  4 mL    Refill:  2    Face-to-face time spent with patient was 30 minutes. Greater than 50% of time was spent in counseling and coordination of care.  Follow-Up Instructions: Return in about 3 months (around 06/23/2020) for Rheumatoid arthritis, Polymyositis.   Ofilia Neas, PA-C  Note - This record has been created using Dragon software.  Chart creation errors have been sought, but may not always  have been located. Such creation errors do not reflect on  the standard of medical care.

## 2020-03-15 ENCOUNTER — Ambulatory Visit: Payer: Federal, State, Local not specified - PPO | Admitting: Physician Assistant

## 2020-03-17 ENCOUNTER — Other Ambulatory Visit: Payer: Self-pay | Admitting: *Deleted

## 2020-03-17 DIAGNOSIS — M3320 Polymyositis, organ involvement unspecified: Secondary | ICD-10-CM | POA: Diagnosis not present

## 2020-03-17 DIAGNOSIS — Z79899 Other long term (current) drug therapy: Secondary | ICD-10-CM | POA: Diagnosis not present

## 2020-03-18 LAB — COMPLETE METABOLIC PANEL WITH GFR
AG Ratio: 1.3 (calc) (ref 1.0–2.5)
ALT: 13 U/L (ref 6–29)
AST: 14 U/L (ref 10–35)
Albumin: 4.3 g/dL (ref 3.6–5.1)
Alkaline phosphatase (APISO): 65 U/L (ref 37–153)
BUN: 19 mg/dL (ref 7–25)
CO2: 24 mmol/L (ref 20–32)
Calcium: 9.7 mg/dL (ref 8.6–10.4)
Chloride: 107 mmol/L (ref 98–110)
Creat: 0.66 mg/dL (ref 0.50–1.05)
GFR, Est African American: 116 mL/min/{1.73_m2} (ref >=60)
GFR, Est Non African American: 100 mL/min/{1.73_m2} (ref >=60)
Globulin: 3.2 g/dL (calc) (ref 1.9–3.7)
Glucose, Bld: 96 mg/dL (ref 65–99)
Potassium: 4.5 mmol/L (ref 3.5–5.3)
Sodium: 138 mmol/L (ref 135–146)
Total Bilirubin: 0.3 mg/dL (ref 0.2–1.2)
Total Protein: 7.5 g/dL (ref 6.1–8.1)

## 2020-03-18 LAB — CK: Total CK: 46 U/L (ref 29–143)

## 2020-03-18 LAB — CBC WITH DIFFERENTIAL/PLATELET
Absolute Monocytes: 663 cells/uL (ref 200–950)
Basophils Absolute: 43 cells/uL (ref 0–200)
Basophils Relative: 0.5 %
Eosinophils Absolute: 170 cells/uL (ref 15–500)
Eosinophils Relative: 2 %
HCT: 40.6 % (ref 35.0–45.0)
Hemoglobin: 13.4 g/dL (ref 11.7–15.5)
Lymphs Abs: 1471 cells/uL (ref 850–3900)
MCH: 28.2 pg (ref 27.0–33.0)
MCHC: 33 g/dL (ref 32.0–36.0)
MCV: 85.3 fL (ref 80.0–100.0)
MPV: 11.2 fL (ref 7.5–12.5)
Monocytes Relative: 7.8 %
Neutro Abs: 6154 cells/uL (ref 1500–7800)
Neutrophils Relative %: 72.4 %
Platelets: 260 10*3/uL (ref 140–400)
RBC: 4.76 10*6/uL (ref 3.80–5.10)
RDW: 12.8 % (ref 11.0–15.0)
Total Lymphocyte: 17.3 %
WBC: 8.5 10*3/uL (ref 3.8–10.8)

## 2020-03-18 NOTE — Progress Notes (Signed)
CBC, CMP and CK are in  within normal limits.

## 2020-03-22 ENCOUNTER — Ambulatory Visit: Payer: Federal, State, Local not specified - PPO

## 2020-03-22 ENCOUNTER — Ambulatory Visit: Payer: Federal, State, Local not specified - PPO | Admitting: Pulmonary Disease

## 2020-03-22 ENCOUNTER — Other Ambulatory Visit: Payer: Self-pay

## 2020-03-22 ENCOUNTER — Encounter: Payer: Self-pay | Admitting: Pulmonary Disease

## 2020-03-22 VITALS — BP 100/60 | HR 83 | Temp 97.3°F | Ht 68.0 in | Wt 272.8 lb

## 2020-03-22 DIAGNOSIS — J849 Interstitial pulmonary disease, unspecified: Secondary | ICD-10-CM | POA: Diagnosis not present

## 2020-03-22 DIAGNOSIS — M3320 Polymyositis, organ involvement unspecified: Secondary | ICD-10-CM | POA: Diagnosis not present

## 2020-03-22 DIAGNOSIS — M069 Rheumatoid arthritis, unspecified: Secondary | ICD-10-CM | POA: Diagnosis not present

## 2020-03-22 DIAGNOSIS — J479 Bronchiectasis, uncomplicated: Secondary | ICD-10-CM

## 2020-03-22 DIAGNOSIS — G4733 Obstructive sleep apnea (adult) (pediatric): Secondary | ICD-10-CM

## 2020-03-22 NOTE — Progress Notes (Signed)
@Patient  ID: Jill Harper, female    DOB: 07-Mar-1966, 54 y.o.   MRN: [id number]  Chief Complaint  Patient presents with  . Follow-up    6 minute walk with f/u.  when multitasking get sob, while walking and talking, ect.    Referring provider: Binnie Rail, MD  HPI:  54 year old female followed in our office for bronchiectasis in the setting of rheumatoid arthritis and polymyositis  PMH: Rheumatoid arthritis, polymyositis, maintained on Imuran per rheumatology Smoker/ Smoking History: Never smoker Maintenance: Breo Ellipta 100, imuran, enbrel Pt of: [name]  03/22/2020  - Visit   54 year old female never smoker followed in our office for bronchiectasis In the setting of rheumatoid arthritis and polymyositis.  Patient also with interstitial lung disease.  She is followed by [name].  She is also followed by rheumatology.  Maintained on Breo Ellipta 100, Imuran and Enbrel.  Patient is completing follow-up with our office today after completing a 6-minute walk.  Patient is also completed a high-resolution CT chest.  Those results are listed below:  03/02/2020-high-resolution CT chest-parents of lungs is compatible with interstitial lung disease with spectrum of findings that is characterized as probable UIP per ATS guidelines, findings have definitive craniocaudal gradient and appear mildly progressive compared to prior examination  Patient completed 6-minute walk today and did well.  She did not have any hypoxemia.  Patient walked 646 m.  Patient reports that she does not have any issues with walking.  Her issues come into play when she is doing multiple tasks at once such as talking or walking, talking on the phone and typing etc.  She struggles with multitasking especially with physical exertion.  Overall she reports that she has some good days and some bad days.  She has started Enbrel.  She is seeing rheumatology tomorrow.  Questionaires / Pulmonary Flowsheets:   ACT:  No flowsheet  data found.  MMRC: mMRC Dyspnea Scale mMRC Score  12/17/2019 1    Epworth:  Results of the Epworth flowsheet 06/16/2018  Sitting and reading 0  Watching TV 1  Sitting, inactive in a public place (e.g. a theatre or a meeting) 0  As a passenger in a car for an hour without a break 2  Lying down to rest in the afternoon when circumstances permit 3  Sitting and talking to someone 0  Sitting quietly after a lunch without alcohol 1  In a car, while stopped for a few minutes in traffic 0  Total score 7    Tests:   FENO:  Lab Results  Component Value Date   NITRICOXIDE 9 12/11/2017    PFT: PFT Results Latest Ref Rng & Units 02/17/2020 02/05/2019 04/17/2018 12/02/2015  FVC-Pre L 2.42 2.58 2.74 3.40  FVC-Predicted Pre % 73 78 90 99  FVC-Post L 2.42 2.56 - 3.24  FVC-Predicted Post % 73 77 - 94  Pre FEV1/FVC % % 86 89 90 87  Post FEV1/FCV % % 90 86 - 92  FEV1-Pre L 2.09 2.30 2.46 2.97  FEV1-Predicted Pre % 80 87 101 108  FEV1-Post L 2.18 2.21 - 2.96  DLCO uncorrected ml/min/mmHg 18.58 19.43 20.26 19.92  DLCO UNC% % 78 81 76 65  DLCO corrected ml/min/mmHg 18.42 19.68 20.01 19.46  DLCO COR %Predicted % 77 82 75 64  DLVA Predicted % 125 120 98 79  TLC L 4.22 4.21 - 4.95  TLC % Predicted % 74 74 - 86  RV % Predicted % 74 69 -  76    WALK:  SIX MIN WALK 03/22/2020 12/17/2019 10/16/2018 07/28/2018 05/16/2018  Medications aspirin, imuran, breo, folvite, mucinex - - Breo 100 at 7am -  Supplimental Oxygen during Test? (L/min) No No - No No  Laps 9.5 - - 9 -  Partial Lap (in Meters) 0 - - 0 -  Baseline BP (sitting) 118/58 - - 118/70 -  Baseline Heartrate 80 - - 83 -  Baseline Dyspnea (Borg Scale) 5 - - 1 -  Baseline Fatigue (Borg Scale) 6 - - 0.5 -  Baseline SPO2 98 - - 95 -  2 Minute Oxygen Saturation % - - 99 - -  2 Minute HR - - 114 - -  4 Minute Oxygen Saturation % - - 96 - -  4 Minute HR - - 115 - -  6 Minute Oxygen Saturation % - - 98 - -  6 Minute HR - - 114 - -  BP (sitting)  120/58 - - 130/80 -  Heartrate 82 - - 84 -  Dyspnea (Borg Scale) 7 - - 3 -  Fatigue (Borg Scale) 9 - - 3 -  SPO2 100 - - 96 -  BP (sitting) 118/58 - - 122/76 -  Heartrate 71 - - 79 -  SPO2 100 - - 97 -  Stopped or Paused before Six Minutes No - - - -  Interpretation Hip pain - - Angina;Dizziness;Leg pain -  Distance Completed 323 - - 306 -  Distance Completed 0 - - - -  Tech Comments: slow pace walk, increased SOB, hip pain Patient was able to complete 1 lap at a leisurely pace. During the last part of the walk, she did state that she felt lightheaded and weak but wanted to continue the walk. - pt walked at a normal pace during walk denying any complaints during walk.  pt did occassionally cough during walk pt had to stop after two laps due to SOB and feeling dizzy.kmw    Imaging: CT Chest High Resolution  Result Date: 03/02/2020 CLINICAL DATA:  54 year old female with history of increasing shortness of breath with exertion. Evaluate for interstitial lung disease. History of rheumatoid arthritis. EXAM: CT CHEST WITHOUT CONTRAST TECHNIQUE: Multidetector CT imaging of the chest was performed following the standard protocol without intravenous contrast. High resolution imaging of the lungs, as well as inspiratory and expiratory imaging, was performed. COMPARISON:  Cardiac CT 07/18/2018. FINDINGS: Cardiovascular: Heart size is normal. There is no significant pericardial fluid, thickening or pericardial calcification. No atherosclerotic calcifications in the thoracic aorta or the coronary arteries. Mediastinum/Nodes: No pathologically enlarged mediastinal or hilar lymph nodes. Please note that accurate exclusion of hilar adenopathy is limited on noncontrast CT scans. Esophagus is unremarkable in appearance. No axillary lymphadenopathy. Lungs/Pleura: High-resolution images demonstrate widespread but patchy areas of ground-glass attenuation, septal thickening, mild cylindrical bronchiectasis, thickening  of the peribronchovascular interstitium and regional architectural distortion. These findings have a definitive craniocaudal gradient and appear mildly progressive compared to the prior examination. No frank honeycombing. Inspiratory and expiratory imaging demonstrates some mild air trapping indicative of mild small airways disease. No acute consolidative airspace disease. No pleural effusions. No definite suspicious appearing pulmonary nodules or masses are noted. Upper Abdomen: 4 mm cortical calcification in the upper pole the right kidney. Musculoskeletal: There are no aggressive appearing lytic or blastic lesions noted in the visualized portions of the skeleton. IMPRESSION: 1. The appearance of the lungs is compatible with interstitial lung disease, with a spectrum of findings that  is categorized as probable usual interstitial pneumonia (UIP) per current ATS guidelines. Electronically Signed   By: Vinnie Langton M.D.   On: 03/02/2020 09:26    Lab Results:  CBC    Component Value Date/Time   WBC 8.5 03/17/2020 1621   RBC 4.76 03/17/2020 1621   HGB 13.4 03/17/2020 1621   HGB 14.2 10/12/2019 1432   HCT 40.6 03/17/2020 1621   HCT 41.9 10/12/2019 1432   PLT 260 03/17/2020 1621   PLT 292 10/12/2019 1432   MCV 85.3 03/17/2020 1621   MCV 86 10/12/2019 1432   MCH 28.2 03/17/2020 1621   MCHC 33.0 03/17/2020 1621   RDW 12.8 03/17/2020 1621   RDW 12.1 10/12/2019 1432   LYMPHSABS 1,471 03/17/2020 1621   LYMPHSABS 1.3 10/12/2019 1432   MONOABS 666 01/31/2017 1006   EOSABS 170 03/17/2020 1621   EOSABS 0.1 10/12/2019 1432   BASOSABS 43 03/17/2020 1621   BASOSABS 0.0 10/12/2019 1432    BMET    Component Value Date/Time   NA 138 03/17/2020 1621   NA 141 10/12/2019 1432   K 4.5 03/17/2020 1621   CL 107 03/17/2020 1621   CO2 24 03/17/2020 1621   GLUCOSE 96 03/17/2020 1621   BUN 19 03/17/2020 1621   BUN 11 10/12/2019 1432   CREATININE 0.66 03/17/2020 1621   CALCIUM 9.7 03/17/2020 1621    GFRNONAA 100 03/17/2020 1621   GFRAA 116 03/17/2020 1621    BNP No results found for: BNP  ProBNP    Component Value Date/Time   PROBNP 58.0 04/18/2009 1157    Specialty Problems      Pulmonary Problems   BRONCHIECTASIS    Dr Halford Chessman  CT sinus 09/07/2015 neg for acute dz.  HRCT 09/07/2015 >Basilar predominant peribronchovascular ground-glass is indicative of bronchopneumonia. . No definitive evidence of underlying interstitial lung disease.            Cough   Bronchiectasis (HCC)   Dyspnea    PFT 12/02/15 >> FEV1 2.96 (108%), FEV1% 92, TLC 4.95 (86%), DLCO 65%, no BD  HRCT chest 09/07/15 >> scattered GGO      ILD (interstitial lung disease) ([place])    HRCT chest 09/07/15 >> scattered GGO 04/15/2018-CT chest high-res- spectrum of findings compatible with basilar predominant fibrotic interstitial lung disease with progression since 2017,  UIP  Pulmonarytests: PFT 10/30/11 >> FEV1 3.31 (114%), FEV1% 85, TLC 6.06 (108%), DLCO 77%, no BD PFT 11/18/12 >> FEV1 3.18 (111%), FEV1% 84, TLC 5.34 (95%), DLCO 67%, no BD PFT 12/02/15 >> FEV1 2.96 (108%), FEV1% 92, TLC 4.95 (86%), DLCO 65%, no BD 04/17/2018-spirometry with DLCO-DLCO 76%  Serology 01/31/17 >> ANA negative, RF 29, anti CCP 213, SCL 70 negative, SSA/SSB negative, ENA SM negative, Jo-1 positive Quantiferon gold 01/31/17 >> negative       OSA (obstructive sleep apnea)    HST 07/28/18 >> AHI 5, SpO2 low 83%      Bronchiectasis with (acute) exacerbation (HCC)      Allergies  Allergen Reactions  . Sulfonamide Derivatives     REACTION: hives    Immunization History  Administered Date(s) Administered  . Influenza Split 04/10/2011, 06/18/2012, 05/06/2013  . Influenza Whole 05/19/2008, 05/15/2009, 06/01/2010  . Influenza,inj,Quad PF,6+ Mos 04/20/2014, 05/26/2015, 05/31/2016, 04/05/2017, 06/16/2018, 04/22/2019  . PFIZER SARS-COV-2 Vaccination 10/27/2019, 11/19/2019  . Pneumococcal Conjugate-13 01/29/2017  . Pneumococcal  Polysaccharide-23 08/07/2007, 06/16/2018  . Td 07/12/2009  . Tdap 01/06/2020    Past Medical History:  Diagnosis Date  .  Abnormal liver function test   . Bronchiectasis       . Dyspnea   . Fibromuscular dysplasia ([place])   . Fibromyalgia   . Heart murmur   . ILD (interstitial lung disease) ([place])   . Joint pain   . Menorrhagia   . MIGRAINE HEADACHE   . MVP (mitral valve prolapse)   . Osteoarthritis   . Polymyositis ([place])    Dr [name]; chronic MTX  . Pulmonary fibrosis ([place])   . Rheumatoid arthritis ([place])   . Sleep apnea   . Vitamin B 12 deficiency   . Vitamin D deficiency     Tobacco History: Social History   Tobacco Use  Smoking Status Never Smoker  Smokeless Tobacco Never Used  Tobacco Comment   Married, lives with spouse. 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 albuterol (PROVENTIL HFA) 108 (90 Base) MCG/ACT inhaler Inhale 2 puffs into the lungs 4 (four) times daily as needed. Reported on 09/03/2015  . albuterol (PROVENTIL) (2.5 MG/3ML) 0.083% nebulizer solution Take 3 mLs (2.5 mg total) by nebulization every 6 (six) hours as needed for wheezing or shortness of breath.  [name] aspirin EC 81 MG tablet Take 81 mg by mouth daily.  [name] atenolol (TENORMIN) 25 MG tablet TAKE 1/2 TABLET BY MOUTH DAILY AS NEEDED FOR MITRAL PROLAPSE  . azaTHIOprine (IMURAN) 50 MG tablet TAKE 2 TABLETS(100 MG) BY MOUTH DAILY  . diclofenac Sodium (VOLTAREN) 1 % GEL Apply 2-4 grams to affected joint 4 times daily as needed.  . eletriptan (RELPAX) 20 MG tablet Take 1 tablet (20 mg total) by mouth as needed for migraine.  . Etanercept (ENBREL MINI) 50 MG/ML SOCT Inject 50 mg into the skin once a week.  . fluticasone furoate-vilanterol  (BREO ELLIPTA) 100-25 MCG/INH AEPB INHALE 1 PUFF INTO THE LUNGS DAILY  . folic acid (FOLVITE) 1 MG tablet TAKE 2 TABLETS(2 MG) BY MOUTH DAILY  . Fremanezumab-vfrm (AJOVY) 225 MG/1.5ML SOAJ Inject 225 mg into the skin every 30 (thirty) days.  [name] guaiFENesin (MUCINEX) 600 MG 12 hr tablet Take 2 tablets (1,200 mg total) by mouth 2 (two) times daily as needed for cough or to loosen phlegm. (Patient taking differently: Take 1,200 mg by mouth 2 (two) times daily. )  . metFORMIN (GLUCOPHAGE) 500 MG tablet TAKE 1 TABLET(500 MG) BY MOUTH TWICE DAILY WITH A MEAL  . Multiple Vitamins-Minerals (MULTIVITAMIN GUMMIES ADULT PO) Take 2 each by mouth daily.  [name] Respiratory Therapy Supplies (FLUTTER) DEVI Use as directed (Patient taking differently: as needed. Use as directed)  . telmisartan (MICARDIS) 20 MG tablet TAKE 1 TABLET(20 MG) BY MOUTH DAILY  . Vitamin D, Ergocalciferol, (DRISDOL) 1.25 MG (50000 UNIT) CAPS capsule Take 1 capsule (50,000 Units total) by mouth every 7 (seven) days.   No facility-administered encounter medications on file as of 03/22/2020.     Review of Systems  Review of Systems  Constitutional: Positive for fatigue. Negative for activity change and fever.  HENT: Negative for sinus pressure, sinus pain and sore throat.   Respiratory: Positive for cough and shortness of breath. Negative for wheezing.   Cardiovascular: Negative for chest pain and palpitations.  Gastrointestinal: Negative for diarrhea, nausea and vomiting.  Musculoskeletal: Positive for arthralgias.  Neurological: Negative for dizziness.  Psychiatric/Behavioral: Negative for sleep disturbance. The patient is not nervous/anxious.      Physical Exam  BP 100/60 (BP Location: Left Arm, Cuff Size: Large)   Pulse 83   Temp (!) 97.3 F (36.3 C) (Temporal)   Ht 5\' 8"  (1.727 m)   Wt 272 lb 12.8 oz (123.7 kg)   SpO2 99%   BMI 41.48 kg/m   Wt Readings from Last 5 Encounters:  03/22/20 272 lb 12.8 oz (123.7 kg)  03/22/20  272 lb 12.8 oz (123.7 kg)  02/19/20 273 lb 9.6 oz (124.1 kg)  02/02/20 273 lb 9.6 oz (124.1 kg)  01/06/20 271 lb 9.6 oz (123.2 kg)    BMI Readings from Last 5 Encounters:  03/22/20 41.48 kg/m  03/22/20 41.48 kg/m  02/19/20 41.60 kg/m  02/02/20 41.60 kg/m  01/06/20 41.30 kg/m     Physical Exam Vitals and nursing note reviewed.  Constitutional:      General: She is not in acute distress.    Appearance: Normal appearance. She is obese.  HENT:     Head: Normocephalic and atraumatic.     Right Ear: External ear normal.     Left Ear: External ear normal.     Nose: Nose normal. No congestion.     Mouth/Throat:     Mouth: Mucous membranes are moist.     Pharynx: Oropharynx is clear.  Eyes:     Pupils: Pupils are equal, round, and reactive to light.  Cardiovascular:     Rate and Rhythm: Normal rate and regular rhythm.     Pulses: Normal pulses.     Heart sounds: Normal heart sounds. No murmur heard.   Pulmonary:     Effort: Pulmonary effort is normal. No respiratory distress.     Breath sounds: No decreased air movement. No decreased breath sounds, wheezing or rales.  Musculoskeletal:     Cervical back: Normal range of motion.  Skin:    General: Skin is warm and dry.     Capillary Refill: Capillary refill takes less than 2 seconds.  Neurological:     General: No focal deficit present.     Mental Status: She is alert and oriented to person, place, and time. Mental status is at baseline.     Gait: Gait normal.  Psychiatric:        Mood and Affect: Mood normal.        Behavior: Behavior normal.        Thought Content: Thought content normal.        Judgment: Judgment normal.       Assessment & Plan:   Discussion: Stable 6-minute walk, high-resolution CT just shows mild progression from 2019.  Fairly stable pulmonary function testing.  I believe it is in the patient's best interest to continue to have follow-up with rheumatology as well as to work to aggressively  control her rheumatoid arthritis as well as poly myositis.  She has upcoming appointments with them tomorrow.  OSA (obstructive sleep apnea) Plan: Continue CPAP therapy  ILD (interstitial lung disease) (HCC) Recent pulmonary function testing showing drop in FVC but stability and lung functioning as well as predicted values High-resolution CT chest recently completed shows mild progression from 2019 Known connective tissue disorders followed by rheumatology, recently started Enbrel, maintained on Imuran  Plan: We will order spirometry with DLCO to be completed in January/2022 Offered referral to pulmonary rehab, patient declines today due to surgery numbers of delta variant for COVID-19, we  can reconsider over the next coming months  Bronchiectasis ([place]) Plan: Continue flutter valve Continue Mucinex Remain hydrated  Rheumatoid arthritis ([place]) Plan: Continue follow-up with rheumatology Continue Enbrel Continue Imuran  Polymyositis ([place]) Plan: Continue follow-up with rheumatology Continue Enbrel Continue Imuran    Return in about 3 months (around 06/22/2020), or if symptoms worsen or fail to improve, for Follow up with [name], Follow up for PFT - 45min, Spiro with DLCO.   Lauraine Rinne, NP 03/22/2020   This appointment required 35 minutes of patient care (this includes precharting, chart review, review of results, face-to-face care, etc.).

## 2020-03-22 NOTE — Assessment & Plan Note (Signed)
Plan: Continue follow-up with rheumatology Continue Enbrel Continue Imuran

## 2020-03-22 NOTE — Assessment & Plan Note (Addendum)
Recent pulmonary function testing showing drop in FVC but stability and lung functioning as well as predicted values High-resolution CT chest recently completed shows mild progression from 2019 Known connective tissue disorders followed by rheumatology, recently started Enbrel, maintained on Imuran  Plan: We will order spirometry with DLCO to be completed in January/2022 Offered referral to pulmonary rehab, patient declines today due to surgery numbers of delta variant for COVID-19, we can reconsider over the next coming months

## 2020-03-22 NOTE — Assessment & Plan Note (Signed)
Plan: Continue flutter valve Continue Mucinex Remain hydrated

## 2020-03-22 NOTE — Assessment & Plan Note (Signed)
Plan: Continue CPAP therapy 

## 2020-03-22 NOTE — Progress Notes (Cosign Needed)
Six Minute Walk - 03/22/20 1632      Six Minute Walk   Medications taken before test (dose and time) aspirin, imuran, breo, folvite, mucinex    Supplemental oxygen during test? No    Lap distance in meters  34 meters    Laps Completed 9.5    Partial lap (in meters) 0 meters    Baseline BP (sitting) 118/58    Baseline Heartrate 80    Baseline Dyspnea (Borg Scale) 5    Baseline Fatigue (Borg Scale) 6    Baseline SPO2 98 %      End of Test Values    BP (sitting) 120/58    Heartrate 82    Dyspnea (Borg Scale) 7    Fatigue (Borg Scale) 9    SPO2 100 %      2 Minutes Post Walk Values   BP (sitting) 118/58    Heartrate 71    SPO2 100 %    Stopped or paused before six minutes? No    Other Symptoms at end of exercise: Hip pain      Interpretation   Distance completed 323 meters    Tech Comments: slow pace walk, increased SOB, hip pain

## 2020-03-22 NOTE — Patient Instructions (Addendum)
You were seen today by Lauraine Rinne, NP  for:   1. Bronchiectasis without complication (HCC)  Bronchiectasis: This is the medical term which indicates that you have damage, dilated airways making you more susceptible to respiratory infection. Use a flutter valve 10 breaths twice a day or 4 to 5 breaths 4-5 times a day to help clear mucus out Let us know if you have cough with change in mucus color or fevers or chills.  At that point you would need an antibiotic. Maintain a healthy nutritious diet, eating whole foods Take your medications as prescribed   2. ILD (interstitial lung disease) ([place])  Spirometry with DLCO 30-minute breathing test in 6 months in January/2022  3. Polymyositis ([place]) 4. Rheumatoid arthritis, involving unspecified site, unspecified whether rheumatoid factor present [facility])  Continue follow-up with rheumatology  Continue Imuran  Continue Enbrel  5. OSA (obstructive sleep apnea)  We recommend that you continue using your CPAP daily >>>Keep up the hard work using your device >>> Goal should be wearing this for the entire night that you are sleeping, at least 4 to 6 hours  Remember:  . Do not drive or operate heavy machinery if tired or drowsy.  . Please notify the supply company and office if you are unable to use your device regularly due to missing supplies or machine being broken.  . Work on maintaining a healthy weight and following your recommended nutrition plan  . Maintain proper daily exercise and movement  . Maintaining proper use of your device can also help improve management of other chronic illnesses such as: Blood pressure, blood sugars, and weight management.   BiPAP/ CPAP Cleaning:  >>>Clean weekly, with Dawn soap, and bottle brush.  Set up to air dry. >>> Wipe mask out daily with wet wipe or towelette   Follow Up:    Return in about 3 months (around 06/22/2020), or if symptoms worsen or fail to improve, for Follow up with [name], Follow  up for PFT - 51min, Spiro with DLCO.  Arlyce Harman and Dlco in 6 months in Jan/2022       Please do your part to reduce the spread of COVID-19:      Reduce your risk of any infection  and COVID19 by using the similar precautions used for avoiding the common cold or flu:  [name] Wash your hands often with soap and warm water for at least 20 seconds.  If soap and water are not readily available, use an alcohol-based hand sanitizer with at least 60% alcohol.  . If coughing or sneezing, cover your mouth and nose by coughing or sneezing into the elbow areas of your shirt or coat, into a tissue or into your sleeve (not your hands). Langley Gauss A MASK when in public  . Avoid shaking hands with others and consider head nods or verbal greetings only. . Avoid touching your eyes, nose, or mouth with unwashed hands.  . Avoid close contact with people who are sick. . Avoid places or events with large numbers of people in one location, like concerts or sporting events. . If you have some symptoms but not all symptoms, continue to monitor at home and seek medical attention if your symptoms worsen. . If you are having a medical emergency, call 911.   [place] / e-Visit: [email or web address]         [facility]: [facility]: [phone]  [facility]: [phone]     It is flu season:   >>> Best ways to protect herself from the flu: Receive the yearly flu vaccine, practice good hand hygiene washing with soap and also using hand sanitizer when available, eat a nutritious meals, get adequate rest, hydrate appropriately   Please contact the office if your symptoms worsen or you have concerns that you are not improving.   Thank you for choosing [facility] for your healthcare, and for allowing [name] to partner with you on your  healthcare journey. I am thankful to be able to provide care to you today.   [name] FNP-C

## 2020-03-23 ENCOUNTER — Ambulatory Visit: Payer: Federal, State, Local not specified - PPO | Admitting: Physician Assistant

## 2020-03-23 ENCOUNTER — Encounter: Payer: Self-pay | Admitting: Physician Assistant

## 2020-03-23 VITALS — BP 128/82 | HR 65 | Resp 17 | Ht 68.0 in | Wt 274.0 lb

## 2020-03-23 DIAGNOSIS — J849 Interstitial pulmonary disease, unspecified: Secondary | ICD-10-CM

## 2020-03-23 DIAGNOSIS — M3320 Polymyositis, organ involvement unspecified: Secondary | ICD-10-CM

## 2020-03-23 DIAGNOSIS — M0579 Rheumatoid arthritis with rheumatoid factor of multiple sites without organ or systems involvement: Secondary | ICD-10-CM

## 2020-03-23 DIAGNOSIS — Z8679 Personal history of other diseases of the circulatory system: Secondary | ICD-10-CM

## 2020-03-23 DIAGNOSIS — J479 Bronchiectasis, uncomplicated: Secondary | ICD-10-CM

## 2020-03-23 DIAGNOSIS — R5383 Other fatigue: Secondary | ICD-10-CM

## 2020-03-23 DIAGNOSIS — Z8669 Personal history of other diseases of the nervous system and sense organs: Secondary | ICD-10-CM

## 2020-03-23 DIAGNOSIS — G5603 Carpal tunnel syndrome, bilateral upper limbs: Secondary | ICD-10-CM

## 2020-03-23 DIAGNOSIS — M19042 Primary osteoarthritis, left hand: Secondary | ICD-10-CM

## 2020-03-23 DIAGNOSIS — L52 Erythema nodosum: Secondary | ICD-10-CM

## 2020-03-23 DIAGNOSIS — M19041 Primary osteoarthritis, right hand: Secondary | ICD-10-CM

## 2020-03-23 DIAGNOSIS — I773 Arterial fibromuscular dysplasia: Secondary | ICD-10-CM

## 2020-03-23 DIAGNOSIS — Z79899 Other long term (current) drug therapy: Secondary | ICD-10-CM | POA: Diagnosis not present

## 2020-03-23 DIAGNOSIS — M797 Fibromyalgia: Secondary | ICD-10-CM

## 2020-03-23 DIAGNOSIS — E559 Vitamin D deficiency, unspecified: Secondary | ICD-10-CM

## 2020-03-23 DIAGNOSIS — M19072 Primary osteoarthritis, left ankle and foot: Secondary | ICD-10-CM

## 2020-03-23 DIAGNOSIS — R768 Other specified abnormal immunological findings in serum: Secondary | ICD-10-CM | POA: Diagnosis not present

## 2020-03-23 DIAGNOSIS — M19071 Primary osteoarthritis, right ankle and foot: Secondary | ICD-10-CM

## 2020-03-23 MED ORDER — ENBREL MINI 50 MG/ML ~~LOC~~ SOCT: 50.0000 mg | SUBCUTANEOUS | 2 refills | Status: DC

## 2020-03-23 NOTE — Progress Notes (Signed)
Reviewed and agree with assessment/plan.   Chesley Mires, MD [facility] Pulmonary/Critical Care 03/23/2020, 8:29 AM Pager:  [phone]

## 2020-03-23 NOTE — Patient Instructions (Signed)
Standing Labs We placed an order today for your standing lab work.   Please have your standing labs drawn in November and every 3 months   If possible, please have your labs drawn 2 weeks prior to your appointment so that the provider can discuss your results at your appointment.  We have open lab daily Monday through Thursday from 8:30-12:30 PM and 1:30-4:30 PM and Friday from 8:30-12:30 PM and 1:30-4:00 PM at the office of Dr. Bo Merino, [facility].   Please be advised, patients with office appointments requiring lab work will take precedents over walk-in lab work.  If possible, please come for your lab work on Monday and Friday afternoons, as you may experience shorter wait times. The office is located at [address] [place], [place], [place] [address] No appointment is necessary.   Labs are drawn by [facility]. Please bring your co-pay at the time of your lab draw.  You may receive a bill from [facility] for your lab work.  If you wish to have your labs drawn at another location, please call the office 24 hours in advance to send orders.  If you have any questions regarding directions or hours of operation,  please call [phone].   As a reminder, please drink plenty of water prior to coming for your lab work. Thanks!   COVID-19 vaccine recommendations:   COVID-19 vaccine is recommended for everyone (unless you are allergic to a vaccine component), even if you are on a medication that suppresses your immune system.   If you are on Methotrexate, Cellcept (mycophenolate), Rinvoq, Morrie Sheldon, and Olumiant- hold the medication for 1 week after each vaccine. Hold Methotrexate for 2 weeks after the single dose COVID-19 vaccine.   If you are on Orencia subcutaneous injection - hold medication one week prior to and one week after the first COVID-19 vaccine dose (only).   If you are on Orencia IV infusions- time vaccination administration so that the first COVID-19  vaccination will occur four weeks after the infusion and postpone the subsequent infusion by one week.   If you are on Cyclophosphamide or Rituxan infusions please contact your doctor prior to receiving the COVID-19 vaccine.   Do not take Tylenol or ant anti-inflammatory medications (NSAIDs) 24 hours prior to the COVID-19 vaccination.   There is no direct evidence about the efficacy of the COVID-19 vaccine in individuals who are on medications that suppress the immune system.   Even if you are fully vaccinated, and you are on any medications that suppress your immune system, please continue to wear a mask, maintain at least six feet social distance and practice hand hygiene.   If you develop a COVID-19 infection, please contact your PCP or our office to determine if you need antibody infusion.  The booster vaccine is now available for immunocompromised patients. It is advised that if you had Pfizer vaccine you should get Coca-Cola booster.  If you had a Moderna vaccine then you should get a Moderna booster. Johnson and Wynetta Emery does not have a booster vaccine at this time.  Please see the following web sites for updated information.   [email or web address]://[email or web address]/[email or web address]-Vaccination-Patient-Resources.[email or web address]  [email or web address]://[email or web address]/About-Us/Newsroom/Press-Releases/ID/1159

## 2020-03-28 DIAGNOSIS — J471 Bronchiectasis with (acute) exacerbation: Secondary | ICD-10-CM | POA: Diagnosis not present

## 2020-03-28 DIAGNOSIS — G4733 Obstructive sleep apnea (adult) (pediatric): Secondary | ICD-10-CM | POA: Diagnosis not present

## 2020-03-30 ENCOUNTER — Other Ambulatory Visit: Payer: Self-pay | Admitting: Rheumatology

## 2020-03-30 NOTE — Telephone Encounter (Signed)
Last Visit: 03/23/2020 Next Visit: 06/22/2020 Labs: 03/17/2020 CBC, CMP and CK are in within normal limits.  Current Dose per office note 03/23/2020:  Imuran 100 mg by mouth daily DX: Rheumatoid arthritis involving multiple sites with positive rheumatoid factor   Okay to refill per Dr. Estanislado Pandy

## 2020-03-31 ENCOUNTER — Telehealth: Payer: Self-pay | Admitting: *Deleted

## 2020-03-31 NOTE — Telephone Encounter (Signed)
Medication Samples have been provided to the patient.  Drug name: Enbrel Mini       Strength: 50 mg       Qty: 1 LOT:  [id number]  Exp.Date: 11/2021  Dosing instructions: Inject one cartridge into skin once weekly.   The patient has been instructed regarding the correct time, dose, and frequency of taking this medication, including desired effects and most common side effects.   [name] 4:52 PM 03/31/2020

## 2020-04-21 NOTE — Patient Instructions (Addendum)
Flu immunization administered today.    Medications reviewed and updated.  Changes include :   none  Your prescription(s) have been submitted to your pharmacy. Please take as directed and contact our office if you believe you are having problem(s) with the medication(s).    Please followup in 6 months   

## 2020-04-21 NOTE — Progress Notes (Signed)
Subjective:    Patient ID: Jill Harper, female    DOB: January 23, 1966, 54 y.o.   MRN: [id number]  HPI The patient is here for follow up of their chronic medical problems, including htn, fibromuscular dysplasia  She is taking all of her medications as prescribed.    She started Enbrel a couple of months ago.  She has seen some improvement.     Medications and allergies reviewed with patient and updated if appropriate.  Patient Active Problem List   Diagnosis Date Noted  . Occipital neuralgia of left side 01/06/2020  . Bilateral carpal tunnel syndrome 07/21/2019  . Costochondritis 03/16/2019  . Rib pain on right side 03/16/2019  . Insulin resistance 11/27/2018  . Class 3 severe obesity with serious comorbidity and body mass index (BMI) of 40.0 to 44.9 in adult [facility]) 11/27/2018  . Fibromuscular dysplasia ([place]) 10/17/2018  . Hypertension 10/17/2018  . Bronchiectasis with (acute) exacerbation ([place]) 10/08/2018  . OSA (obstructive sleep apnea) 08/20/2018  . Rheumatoid arthritis ([place]) 04/17/2018  . ILD (interstitial lung disease) ([place]) 04/17/2018  . Dyspnea 04/01/2018  . Bronchiectasis ([place]) 09/02/2017  . LVH (left ventricular hypertrophy) 06/02/2017  . Mitral regurgitation 05/22/2017  . Jo-1 antibody positive 01/29/2017  . Primary osteoarthritis of both feet 01/29/2017  . Primary osteoarthritis of both hands 01/29/2017  . Left shoulder tendonitis 01/17/2017  . Vitamin D deficiency 04/26/2016  . Cough 07/18/2015  . MVP (mitral valve prolapse)   . Erythema nodosum   . INCONTINENCE, URGE 05/26/2010  . Migraine headache 02/15/2010  . Fibromyalgia 06/20/2009  . Polymyositis ([place]) 02/11/2008  . BRONCHIECTASIS 02/10/2008    Current Outpatient Medications on File Prior to Visit  Medication Sig Dispense Refill  . albuterol (PROVENTIL HFA) 108 (90 Base) MCG/ACT inhaler Inhale 2 puffs into the lungs 4 (four) times daily as needed. Reported on 09/03/2015 1 Inhaler 4  . albuterol  (PROVENTIL) (2.5 MG/3ML) 0.083% nebulizer solution Take 3 mLs (2.5 mg total) by nebulization every 6 (six) hours as needed for wheezing or shortness of breath. 120 vial 5  . aspirin EC 81 MG tablet Take 81 mg by mouth daily.    [name] atenolol (TENORMIN) 25 MG tablet TAKE 1/2 TABLET BY MOUTH DAILY AS NEEDED FOR MITRAL PROLAPSE 45 tablet 0  . azaTHIOprine (IMURAN) 50 MG tablet TAKE 2 TABLETS(100 MG) BY MOUTH DAILY 60 tablet 2  . diclofenac Sodium (VOLTAREN) 1 % GEL Apply 2-4 grams to affected joint 4 times daily as needed. 400 g 2  . eletriptan (RELPAX) 20 MG tablet Take 1 tablet (20 mg total) by mouth as needed for migraine. 10 tablet 11  . Etanercept (ENBREL MINI) 50 MG/ML SOCT Inject 50 mg into the skin once a week. 4 mL 2  . fluticasone furoate-vilanterol (BREO ELLIPTA) 100-25 MCG/INH AEPB INHALE 1 PUFF INTO THE LUNGS DAILY 60 each 5  . folic acid (FOLVITE) 1 MG tablet TAKE 2 TABLETS(2 MG) BY MOUTH DAILY 180 tablet 3  . Fremanezumab-vfrm (AJOVY) 225 MG/1.5ML SOAJ Inject 225 mg into the skin every 30 (thirty) days. 1 pen 11  . guaiFENesin (MUCINEX) 600 MG 12 hr tablet Take 2 tablets (1,200 mg total) by mouth 2 (two) times daily as needed for cough or to loosen phlegm. (Patient taking differently: Take 1,200 mg by mouth 2 (two) times daily. )    . metFORMIN (GLUCOPHAGE) 500 MG tablet TAKE 1 TABLET(500 MG) BY MOUTH TWICE DAILY WITH A MEAL 60 tablet 0  . Multiple Vitamins-Minerals (MULTIVITAMIN  GUMMIES ADULT PO) Take 2 each by mouth daily.    [name] Respiratory Therapy Supplies (FLUTTER) DEVI Use as directed (Patient taking differently: as needed. Use as directed) 1 each 0  . telmisartan (MICARDIS) 20 MG tablet TAKE 1 TABLET(20 MG) BY MOUTH DAILY 90 tablet 1  . Vitamin D, Ergocalciferol, (DRISDOL) 1.25 MG (50000 UNIT) CAPS capsule Take 1 capsule (50,000 Units total) by mouth every 7 (seven) days. 12 capsule 0   No current facility-administered medications on file prior to visit.    Past Medical History:   Diagnosis Date  . Abnormal liver function test   . Bronchiectasis       . Dyspnea   . Fibromuscular dysplasia ([place])   . Fibromyalgia   . Heart murmur   . ILD (interstitial lung disease) ([place])   . Joint pain   . Menorrhagia   . MIGRAINE HEADACHE   . MVP (mitral valve prolapse)   . Osteoarthritis   . Polymyositis ([place])    Dr [name]; chronic MTX  . Pulmonary fibrosis ([place])   . Rheumatoid arthritis ([place])   . Sleep apnea   . Vitamin B 12 deficiency   . Vitamin D deficiency     Past Surgical History:  Procedure Laterality Date  . ablation uterine  2010  . CARDIAC CATHETERIZATION  2002   normal  . DILATION AND CURETTAGE OF UTERUS  08-31-08   Dr [name]  . IR ANGIO INTRA EXTRACRAN SEL COM CAROTID INNOMINATE BILAT MOD SED  09/18/2018  . IR ANGIO VERTEBRAL SEL VERTEBRAL BILAT MOD SED  09/18/2018  . IR US GUIDE VASC ACCESS RIGHT  09/18/2018    Social History   Socioeconomic History  . Marital status: Married    Spouse name: Keyuna Cuthrell  . Number of children: 1  . Years of education: Not on file  . Highest education level: High school graduate  Occupational History  . Occupation: Compliant and Injury Clerk  Tobacco Use  . Smoking status: Never Smoker  . Smokeless tobacco: Never Used  . Tobacco comment: Married, lives with spouse. 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 Vaping Use: Never used  Substance and Sexual Activity  . Alcohol use: Not Currently    Comment: maybe a wine cooler once every other year  . Drug use: Never  . Sexual activity: Yes    Birth control/protection: Surgical  Other Topics Concern  . Not on file  Social History Narrative   Exercise: trying to walk - limited by fatigue   Lives at home with husband    Right handed   Caffeine: occasional coke 1-2x per month   Social Determinants of Health   Financial Resource Strain:   . Difficulty of Paying Living Expenses: Not on file  Food  Insecurity:   . Worried About Charity fundraiser in the Last Year: Not on file  . Ran Out of Food in the Last Year: Not on file  Transportation Needs:   . Lack of Transportation (Medical): Not on file  . Lack of Transportation (Non-Medical): Not on file  Physical Activity:   . Days of Exercise per Week: Not on file  . Minutes of Exercise per Session: Not on file  Stress:   . Feeling of Stress : Not on file  Social Connections:   . Frequency of Communication with Friends and Family: Not on file  . Frequency of Social Gatherings with Friends and Family:  Not on file  . Attends Religious Services: Not on file  . Active Member of Clubs or Organizations: Not on file  . Attends Archivist Meetings: Not on file  . Marital Status: Not on file    Family History  Problem Relation Age of Onset  . Diabetes Mother   . Fibromyalgia Mother   . Ulcers Mother   . Heart failure Mother   . Thyroid disease Mother   . Obesity Mother   . Multiple myeloma Father   . Hypertension Father   . Stroke Father   . Lupus Sister   . Other Sister        abdominal adhesions resulting in bowel obstruction  . Migraines Sister   . Heart disease Brother        bypass surgery  . Rheum arthritis Sister   . Multiple myeloma Sister   . Hypertension Sister   . Heart attack Sister   . Diabetes Sister   . Hypertension Sister   . Rheum arthritis Sister   . Diabetes Sister   . Diabetes Brother   . Headache Other        siblings with headaches but not diagnosed as migraines    Review of Systems  Constitutional: Negative for fever.  Respiratory: Positive for cough (chronic - no change) and shortness of breath (w exertion at times). Negative for wheezing.   Cardiovascular: Positive for palpitations (occ with MVP). Negative for chest pain and leg swelling.  Musculoskeletal: Positive for arthralgias and myalgias.  Neurological: Positive for headaches. Negative for dizziness and light-headedness.        Objective:   Vitals:   04/22/20 1551  BP: 130/80  Pulse: 80  Temp: 98.6 F (37 C)  SpO2: 95%   BP Readings from Last 3 Encounters:  04/22/20 130/80  03/23/20 128/82  03/22/20 100/60   Wt Readings from Last 3 Encounters:  04/22/20 272 lb 3.2 oz (123.5 kg)  03/23/20 274 lb (124.3 kg)  03/22/20 272 lb 12.8 oz (123.7 kg)   Body mass index is 41.39 kg/m.   Physical Exam    Constitutional: Appears well-developed and well-nourished. No distress.  HENT:  Head: Normocephalic and atraumatic.  Neck: Neck supple. No tracheal deviation present. No thyromegaly present.  No cervical lymphadenopathy Cardiovascular: Normal rate, regular rhythm and normal heart sounds.   2/6 sys murmur heard. No carotid bruit .  No edema Pulmonary/Chest: Effort normal and breath sounds normal. No respiratory distress. No has no wheezes. No rales.  Skin: Skin is warm and dry. Not diaphoretic.  Psychiatric: Normal mood and affect. Behavior is normal.      Assessment & Plan:    See Problem List for Assessment and Plan of chronic medical problems.    This visit occurred during the SARS-CoV-2 public health emergency.  Safety protocols were in place, including screening questions prior to the visit, additional usage of staff PPE, and extensive cleaning of exam room while observing appropriate contact time as indicated for disinfecting solutions.

## 2020-04-22 ENCOUNTER — Encounter: Payer: Self-pay | Admitting: Internal Medicine

## 2020-04-22 ENCOUNTER — Ambulatory Visit: Payer: Federal, State, Local not specified - PPO | Admitting: Internal Medicine

## 2020-04-22 ENCOUNTER — Other Ambulatory Visit: Payer: Self-pay

## 2020-04-22 VITALS — BP 130/80 | HR 80 | Temp 98.6°F | Wt 272.2 lb

## 2020-04-22 DIAGNOSIS — M797 Fibromyalgia: Secondary | ICD-10-CM

## 2020-04-22 DIAGNOSIS — R7303 Prediabetes: Secondary | ICD-10-CM | POA: Diagnosis not present

## 2020-04-22 DIAGNOSIS — G43809 Other migraine, not intractable, without status migrainosus: Secondary | ICD-10-CM

## 2020-04-22 DIAGNOSIS — I10 Essential (primary) hypertension: Secondary | ICD-10-CM

## 2020-04-22 DIAGNOSIS — E8881 Metabolic syndrome: Secondary | ICD-10-CM

## 2020-04-22 DIAGNOSIS — Z23 Encounter for immunization: Secondary | ICD-10-CM | POA: Diagnosis not present

## 2020-04-22 MED ORDER — TELMISARTAN 20 MG PO TABS: ORAL_TABLET | ORAL | 1 refills | Status: DC

## 2020-04-22 MED ORDER — METFORMIN HCL 500 MG PO TABS: ORAL_TABLET | ORAL | 1 refills | Status: DC

## 2020-04-22 NOTE — Assessment & Plan Note (Signed)
Chronic BP well controlled Current regimen effective and well tolerated Continue current medications at current doses  

## 2020-04-22 NOTE — Assessment & Plan Note (Signed)
Chronic Sees Dr Jaynee Eagles Mild chronic headache - ? Related to FMD Overall migraines controlled

## 2020-04-22 NOTE — Assessment & Plan Note (Addendum)
Chronic  Did not try the methocarbamol Taking Tylenol PM 2-3 times a week at night Increased stretching Overall controlled

## 2020-04-22 NOTE — Assessment & Plan Note (Addendum)
Chronic Taking metformin - will refill She will go back to the [facility] clinic once her appts decrease a little more

## 2020-04-24 ENCOUNTER — Other Ambulatory Visit: Payer: Self-pay | Admitting: Internal Medicine

## 2020-05-02 ENCOUNTER — Other Ambulatory Visit: Payer: Self-pay | Admitting: Rheumatology

## 2020-05-25 ENCOUNTER — Other Ambulatory Visit: Payer: Self-pay

## 2020-05-25 ENCOUNTER — Encounter: Payer: Self-pay | Admitting: Pulmonary Disease

## 2020-05-25 ENCOUNTER — Ambulatory Visit: Payer: Federal, State, Local not specified - PPO | Admitting: Pulmonary Disease

## 2020-05-25 VITALS — BP 116/64 | HR 76 | Temp 97.5°F | Ht 68.0 in | Wt 274.0 lb

## 2020-05-25 DIAGNOSIS — J479 Bronchiectasis, uncomplicated: Secondary | ICD-10-CM

## 2020-05-25 DIAGNOSIS — M069 Rheumatoid arthritis, unspecified: Secondary | ICD-10-CM | POA: Diagnosis not present

## 2020-05-25 DIAGNOSIS — G4733 Obstructive sleep apnea (adult) (pediatric): Secondary | ICD-10-CM

## 2020-05-25 DIAGNOSIS — J849 Interstitial pulmonary disease, unspecified: Secondary | ICD-10-CM | POA: Diagnosis not present

## 2020-05-25 DIAGNOSIS — G4734 Idiopathic sleep related nonobstructive alveolar hypoventilation: Secondary | ICD-10-CM

## 2020-05-25 DIAGNOSIS — M3320 Polymyositis, organ involvement unspecified: Secondary | ICD-10-CM | POA: Diagnosis not present

## 2020-05-25 NOTE — Progress Notes (Addendum)
[facility]  Chief Complaint  Patient presents with  . Follow-up    intermittent shortness of breath at rest     Constitutional:  BP 116/64 (BP Location: Left Wrist, Cuff Size: Normal)   Pulse 76   Temp (!) 97.5 F (36.4 C) (Other (Comment)) Comment (Src): wrist  Ht 5' 8"  (1.727 m)   Wt 274 lb (124.3 kg)   SpO2 98% Comment: Room air  BMI 41.66 kg/m   Past Medical History:  Fibromyalgia, Migraine HA, MVP, OA, Fibromuscular dysplasia, Erythema nodosum, Vit D deficiency  Past Surgical History:  Her  has a past surgical history that includes Cardiac catheterization (2002); Dilation and curettage of uterus (08-31-08); ablation uterine (2010); IR ANGIO VERTEBRAL SEL VERTEBRAL BILAT MOD SED (09/18/2018); IR ANGIO INTRA EXTRACRAN SEL COM CAROTID INNOMINATE BILAT MOD SED (09/18/2018); and IR US Guide Vasc Access Right (09/18/2018).  Brief Summary:  Jill Harper is a 54 y.o. female with bronchiectasis in the setting of seropositive rheumatoid arthritis and polymyositis.      Subjective:   She feels that enbrel has improved her joint symptoms.  Her respiratory symptoms are stable.  She gets intermittent episodes of cough with clear to yellow sputum, and wheezing.  No fever, hemoptysis, or chest pain.  Breo helps.  Uses albuterol and mucinex intermittently.  She gets headache when she uses flutter valve and tries to limit use of this.  No issues with CPAP mask.  She feels that pressure sometimes is too low.  Physical Exam:   Appearance - well kempt   ENMT - no sinus tenderness, no oral exudate, no LAN, Mallampati 4 airway, no stridor  Respiratory - equal breath sounds bilaterally, no wheezing or rales  CV - s1s2 regular rate and rhythm, no murmurs  Ext - no clubbing, no edema  Skin - no rashes  Psych - normal mood and affect   Pulmonary testing:   PFT 10/30/11 >> FEV1 3.31 (114%), FEV1% 85, TLC 6.06 (108%), DLCO 77%, no BD  PFT  11/18/12 >> FEV1 3.18 (111%), FEV1% 84, TLC 5.34 (95%), DLCO 67%, no BD  PFT 12/02/15 >> FEV1 2.96 (108%), FEV1% 92, TLC 4.95 (86%), DLCO 65%, no BD  PFT 04/17/18 >> FEV1 2.46 (101%), FEV1% 90, DLCO 76%  PFT 02/05/19 >> FEV1 2.30 (87%), FEV1% 89, TLC 4.21 (74%), DLCO 81%, no BD  PFT 02/17/20 >> FEV1 2.18 (83%), FEV1% 90, TLC 4.22 (74%), DLCO 78%  Chest Imaging:   CT chest 03/23/05 >> patchy b/l lower lung ASD with cylindrical BTX  CT chest 02/17/08 >> peripheral and basilar predominant subpleural GGO  CT chest 12/01/08 >> no change  HRCT chest 09/07/15 >> scattered GGO  HRCT chest 04/15/18 >> patchy confluent subpleural and peripheral peribronchovascular reticulation and ground-glass opacity throughout both lungs with a strong basilar gradient, traction BTX  HRCT chest 03/02/20 >> widespread patchy GGO, septal thickening, mild cylindrical BTX, craniocaudal gradient with mild progression, mild air trapping  Labs:   Quantiferon gold 01/31/17 >> negative  Serology 01/31/17 >> RF 29, CCP 213, Jo-1 > 8; ANA, SCL 70, SSA/SSB, ENA Sm negative  Quantiferon gold 01/31/17 >> negative  Ig 01/31/17 >> IgG 1992, IgA 147, IgM 136  Sleep Tests:   HST 07/28/18 >> AHI 5, SpO2 low 83%  ONO with CPAP 11/05/18 >> test time 6 hrs 24 min, baseline SpO2 90%, low SpO2 80%; spent 1 hr 15 min with SpO2 < 88%  Auto CPAP 04/25/20 to  05/24/20 >> used on 30 of 30 nights with average 8 hrs 35 min.  Average AHI 1 with median CPAP 7 and 95 th percentile CPAP 10 cm H2O  Cardiac Tests:   Echo 05/23/18 >> EF 60 to 65%, grade 1 DD, mild MR  Social History:  She  reports that she has never smoked. She has never used smokeless tobacco. She reports previous alcohol use. She reports that she does not use drugs.  Family History:  Her family history includes Diabetes in her brother, mother, sister, and sister; Fibromyalgia in her mother; Headache in an other family member; Heart attack in her sister; Heart disease in her  brother; Heart failure in her mother; Hypertension in her father, sister, and sister; Lupus in her sister; Migraines in her sister; Multiple myeloma in her father and sister; Obesity in her mother; Other in her sister; Rheum arthritis in her sister and sister; Stroke in her father; Thyroid disease in her mother; Ulcers in her mother.     Assessment/Plan:   ILD with UIP pattern and Bronchiectasis in setting of RA and polymyositis. - continue breo and prn albuterol - prn mucinex - she limits use of flutter valve >> causes headaches - discussed symptoms/signs to monitor for that would suggest an exacerbation  Rheumatoid arthritis with positive RF and CCP, polymyositis. - followed by Dr. Bo Merino with rheumatology - has been maintained on imuran - started on enbrel in July 2021 with clinical improvement  Obstructive sleep apnea. - she is compliant with CPAP and reports benefit from therapy - she uses [employer] for her DME - will change her auto CPAP to 7 to 15 cm H2O; if she still feels like pressure is running too low at times, then will change her to fixed setting of 10 cm H2O  Nocturnal hypoxemia. - 2 liters oxygen at night with CPAP - she will call if she wants to purchase a POC on her own when she resumes travelling  COVID 19 advice. - I have advised her to schedule COVID vaccine booster  Lt hand ulnar neuropathy. - I have advised it would be best to defer surgical intervention for now since she would been to be off enbrel for several weeks around the time of surgery  Time Spent Involved in Patient Care on Day of Examination:  35 minutes  Follow up:  Patient Instructions  Follow up in 4 months   Medication List:   Allergies as of 05/25/2020      Reactions   Sulfonamide Derivatives    REACTION: hives      Medication List       Accurate as of May 25, 2020 12:49 PM. If you have any questions, ask your nurse or doctor.        Ajovy 225 MG/1.5ML  Soaj Generic drug: Fremanezumab-vfrm Inject 225 mg into the skin every 30 (thirty) days.   albuterol 108 (90 Base) MCG/ACT inhaler Commonly known as: Proventil HFA Inhale 2 puffs into the lungs 4 (four) times daily as needed. Reported on 09/03/2015   albuterol (2.5 MG/3ML) 0.083% nebulizer solution Commonly known as: PROVENTIL Take 3 mLs (2.5 mg total) by nebulization every 6 (six) hours as needed for wheezing or shortness of breath.   aspirin EC 81 MG tablet Take 81 mg by mouth daily.   atenolol 25 MG tablet Commonly known as: TENORMIN TAKE 1/2 TABLET BY MOUTH DAILY AS NEEDED FOR MITRAL PROLAPSE   azaTHIOprine 50 MG tablet Commonly known as: IMURAN TAKE 2  TABLETS(100 MG) BY MOUTH DAILY   Breo Ellipta 100-25 MCG/INH Aepb Generic drug: fluticasone furoate-vilanterol INHALE 1 PUFF INTO THE LUNGS DAILY   diclofenac Sodium 1 % Gel Commonly known as: VOLTAREN Apply 2-4 grams to affected joint 4 times daily as needed.   eletriptan 20 MG tablet Commonly known as: Relpax Take 1 tablet (20 mg total) by mouth as needed for migraine.   Enbrel Mini 50 MG/ML Soct Generic drug: Etanercept Inject 50 mg into the skin once a week.   Flutter Devi Use as directed What changed:   when to take this  reasons to take this   folic acid 1 MG tablet Commonly known as: FOLVITE TAKE 2 TABLETS(2 MG) BY MOUTH DAILY   guaiFENesin 600 MG 12 hr tablet Commonly known as: Mucinex Take 2 tablets (1,200 mg total) by mouth 2 (two) times daily as needed for cough or to loosen phlegm. What changed: when to take this   metFORMIN 500 MG tablet Commonly known as: GLUCOPHAGE TAKE 1 TABLET(500 MG) BY MOUTH TWICE DAILY WITH A MEAL   MULTIVITAMIN GUMMIES ADULT PO Take 2 each by mouth daily.   telmisartan 20 MG tablet Commonly known as: MICARDIS TAKE 1 TABLET(20 MG) BY MOUTH DAILY   Vitamin D (Ergocalciferol) 1.25 MG (50000 UNIT) Caps capsule Commonly known as: DRISDOL Take 1 capsule (50,000  Units total) by mouth every 7 (seven) days.       Signature:  Chesley Mires, MD [place] Pager - [phone] 05/25/2020, 12:49 PM

## 2020-05-25 NOTE — Patient Instructions (Signed)
Follow up in 4 months 

## 2020-05-28 DIAGNOSIS — J471 Bronchiectasis with (acute) exacerbation: Secondary | ICD-10-CM | POA: Diagnosis not present

## 2020-05-28 DIAGNOSIS — G4733 Obstructive sleep apnea (adult) (pediatric): Secondary | ICD-10-CM | POA: Diagnosis not present

## 2020-06-09 NOTE — Progress Notes (Signed)
Office Visit Note  Patient: Jill Harper             Date of Birth: Nov 01, 1965           MRN: [id number]             PCP: Binnie Rail, MD Referring: Binnie Rail, MD Visit Date: 06/22/2020 Occupation: @GUAROCC @  Subjective:  Medication monitoring   History of Present Illness: Jill Harper is a 54 y.o. female with history of rheumatoid arthritis, fibromyalgia, and polymyositis.  She is taking imuran 100 mg daily and Enbrel 50 mg sq injections once weekly. She is tolerating both medications without any side effects and has not missed any doses recently.  She reports she has noticed a significant improvement in joint pain and inflammation since starting on Enbrel.  The pain and warmth in her right thumb has improved gradually over time.  She continues to have occasional discomfort especially when writing for prolonged periods of time but has not had any increased difficulty with ADLs recently.  She denies any nocturnal pain.  She has been experiencing some increased myalgias and muscle tenderness which she attributes to recent weather changes.  She denies any increased muscle weakness.  She has noticed increased fatigue over the past several weeks.  She is also been having increased shortness of breath which she attributes to weather changes affecting her bronchiectasis.  She uses oxygen at night.   She denies any recent infections.    Activities of Daily Living:  Patient reports morning stiffness for less than 30 minutes.   Patient Denies nocturnal pain.  Difficulty dressing/grooming: Denies Difficulty climbing stairs: Reports Difficulty getting out of chair: Reports Difficulty using hands for taps, buttons, cutlery, and/or writing: Reports  Review of Systems  Constitutional: Positive for fatigue.  HENT: Negative for mouth sores, mouth dryness and nose dryness.   Eyes: Negative for pain, visual disturbance and dryness.  Respiratory: Positive for shortness of breath. Negative for  cough, hemoptysis and difficulty breathing.   Cardiovascular: Positive for palpitations. Negative for chest pain, hypertension and swelling in legs/feet.  Gastrointestinal: Negative for blood in stool, constipation and diarrhea.  Endocrine: Negative for increased urination.  Genitourinary: Negative for painful urination.  Musculoskeletal: Positive for arthralgias, joint pain, myalgias, morning stiffness, muscle tenderness and myalgias. Negative for joint swelling and muscle weakness.  Skin: Negative for color change, pallor, rash, hair loss, nodules/bumps, skin tightness, ulcers and sensitivity to sunlight.  Allergic/Immunologic: Negative for susceptible to infections.  Neurological: Negative for dizziness, numbness, headaches and weakness.  Hematological: Negative for swollen glands.  Psychiatric/Behavioral: Negative for depressed mood and sleep disturbance. The patient is not nervous/anxious.     PMFS History:  Patient Active Problem List   Diagnosis Date Noted  . Occipital neuralgia of left side 01/06/2020  . Bilateral carpal tunnel syndrome 07/21/2019  . Costochondritis 03/16/2019  . Rib pain on right side 03/16/2019  . Insulin resistance 11/27/2018  . Class 3 severe obesity with serious comorbidity and body mass index (BMI) of 40.0 to 44.9 in adult [facility]) 11/27/2018  . Fibromuscular dysplasia ([place]) 10/17/2018  . Hypertension 10/17/2018  . Bronchiectasis with (acute) exacerbation ([place]) 10/08/2018  . OSA (obstructive sleep apnea) 08/20/2018  . Rheumatoid arthritis ([place]) 04/17/2018  . ILD (interstitial lung disease) ([place]) 04/17/2018  . Dyspnea 04/01/2018  . Bronchiectasis ([place]) 09/02/2017  . LVH (left ventricular hypertrophy) 06/02/2017  . Mitral regurgitation 05/22/2017  . Jo-1 antibody positive 01/29/2017  . Primary osteoarthritis of both  feet 01/29/2017  . Primary osteoarthritis of both hands 01/29/2017  . Left shoulder tendonitis 01/17/2017  . Vitamin D deficiency 04/26/2016   . Cough 07/18/2015  . MVP (mitral valve prolapse)   . Erythema nodosum   . INCONTINENCE, URGE 05/26/2010  . Migraine headache 02/15/2010  . Fibromyalgia 06/20/2009  . Polymyositis (HCC) 02/11/2008  . BRONCHIECTASIS 02/10/2008    Past Medical History:  Diagnosis Date  . Abnormal liver function test   . Bronchiectasis       . Dyspnea   . Fibromuscular dysplasia (HCC)   . Fibromyalgia   . Heart murmur   . ILD (interstitial lung disease) (HCC)   . Joint pain   . Menorrhagia   . MIGRAINE HEADACHE   . MVP (mitral valve prolapse)   . Osteoarthritis   . Polymyositis (HCC)    Dr [name]; chronic MTX  . Pulmonary fibrosis (HCC)   . Rheumatoid arthritis (HCC)   . Sleep apnea   . Vitamin B 12 deficiency   . Vitamin D deficiency     Family History  Problem Relation Age of Onset  . Diabetes Mother   . Fibromyalgia Mother   . Ulcers Mother   . Heart failure Mother   . Thyroid disease Mother   . Obesity Mother   . Multiple myeloma Father   . Hypertension Father   . Stroke Father   . Lupus Sister   . Other Sister        abdominal adhesions resulting in bowel obstruction  . Migraines Sister   . Heart disease Brother        bypass surgery  . Rheum arthritis Sister   . Multiple myeloma Sister   . Hypertension Sister   . Heart attack Sister   . Diabetes Sister   . Hypertension Sister   . Rheum arthritis Sister   . Diabetes Sister   . Diabetes Brother   . Headache Other        siblings with headaches but not diagnosed as migraines   Past Surgical History:  Procedure Laterality Date  . ablation uterine  2010  . CARDIAC CATHETERIZATION  2002   normal  . DILATION AND CURETTAGE OF UTERUS  08-31-08   Dr Zelphia Cairo  . IR ANGIO INTRA EXTRACRAN SEL COM CAROTID INNOMINATE BILAT MOD SED  09/18/2018  . IR ANGIO VERTEBRAL SEL VERTEBRAL BILAT MOD SED  09/18/2018  . IR US GUIDE VASC ACCESS RIGHT  09/18/2018   Social History   Social History Narrative   Exercise:  trying to walk - limited by fatigue   Lives at home with husband    Right handed   Caffeine: occasional coke 1-2x per month   Immunization History  Administered Date(s) Administered  . Influenza Split 04/10/2011, 06/18/2012, 05/06/2013  . Influenza Whole 05/19/2008, 05/15/2009, 06/01/2010  . Influenza,inj,Quad PF,6+ Mos 04/20/2014, 05/26/2015, 05/31/2016, 04/05/2017, 06/16/2018, 04/22/2019, 04/22/2020  . PFIZER SARS-COV-2 Vaccination 10/27/2019, 11/19/2019  . Pneumococcal Conjugate-13 01/29/2017  . Pneumococcal Polysaccharide-23 08/07/2007, 06/16/2018  . Td 07/12/2009  . Tdap 01/06/2020     Objective: Vital Signs: BP 123/81 (BP Location: Left Arm, Patient Position: Sitting, Cuff Size: Normal)   Pulse 76   Resp 15   Ht 5\' 8"  (1.727 m)   Wt 281 lb (127.5 kg)   BMI 42.73 kg/m    Physical Exam Vitals and nursing note reviewed.  Constitutional:      Appearance: She is well-developed.  HENT:     Head: Normocephalic and atraumatic.  Eyes:     Conjunctiva/sclera: Conjunctivae normal.  Pulmonary:     Effort: Pulmonary effort is normal.  Abdominal:     Palpations: Abdomen is soft.  Musculoskeletal:     Cervical back: Normal range of motion.  Skin:    General: Skin is warm and dry.     Capillary Refill: Capillary refill takes less than 2 seconds.  Neurological:     Mental Status: She is alert and oriented to person, place, and time.  Psychiatric:        Behavior: Behavior normal.      Musculoskeletal Exam: C-spine, thoracic spine, and lumbar spine good ROM.  Shoulder joints, elbow joints, wrist joints, MCPs, PIPs ,and DIPs good ROM with no synovitis.  Complete fist formation bilaterally.  Hip joints, knee joints, ankle joints, and MTPs good ROM with no discomfort.  No warmth or effusion of knee joints.  No tenderness or swelling of ankle joints.   CDAI Exam: CDAI Score: 0.8  Patient Global: 4 mm; Provider Global: 4 mm Swollen: 0 ; Tender: 0  Joint Exam 06/22/2020   No  joint exam has been documented for this visit   There is currently no information documented on the homunculus. Go to the Rheumatology activity and complete the homunculus joint exam.  Investigation: No additional findings.  Imaging: No results found.  Recent Labs: Lab Results  Component Value Date   WBC 7.3 06/17/2020   HGB 13.8 06/17/2020   PLT 262 06/17/2020   NA 139 06/17/2020   K 4.6 06/17/2020   CL 105 06/17/2020   CO2 26 06/17/2020   GLUCOSE 88 06/17/2020   BUN 14 06/17/2020   CREATININE 0.73 06/17/2020   BILITOT 0.3 06/17/2020   ALKPHOS 76 10/12/2019   AST 16 06/17/2020   ALT 14 06/17/2020   PROT 7.6 06/17/2020   ALBUMIN 4.6 10/12/2019   CALCIUM 9.8 06/17/2020   GFRAA 108 06/17/2020   QFTBGOLDPLUS NEGATIVE 02/02/2020    Speciality Comments: Prior therapy includes: MTX (d/c due to ILD progresssion)  Procedures:  No procedures performed Allergies: Sulfonamide derivatives   Assessment / Plan:     Visit Diagnoses: Rheumatoid arthritis involving multiple sites with positive rheumatoid factor (HCC) - +RF, +CCP: She has mild tenderness of the first PIP joint but no synovitis was noted on exam.  She has noticed a significant clinical improvement on combination therapy of Enbrel 50 mg sq injections once weekly and Imuran 100 mg by mouth daily.  She is tolerating both medications without any side effects and has not had any recent infections.  Her morning stiffness is starting to improve and she has had less nocturnal pain.  She has not had any increased difficulty with ADLs recently.  She will continue on combination therapy as prescribed.  A refill of Enbrel was sent to the pharmacy today.  She was advised to notify us if she develops increased joint pain or joint swelling.  She will follow-up in the office in 5 months.- Plan: Etanercept (ENBREL MINI) 50 MG/ML SOCT  Polymyositis (HCC) - Dx by Dr. Charlestine Night 2007, CK 801, Jo-1 positive: She has not had any signs or symptoms of  a polymyositis flare.  She is not experiencing any increased muscle weakness at this time.  She has no difficulty rising from a seated position or raising her arms above her head.  CK was 46 on 03/17/2020.  She is due to update CK today.  Order was released.  She will continue taking Imuran 100 mg  by mouth daily.  She was advised to notify us if she develops signs or symptoms of a flare.- Plan: CK  High risk medication use - Enbrel 50 mg sq injections once weekly (02/18/20-1st injection). Imuran 100 mg by mouth daily. d/c MXT due to progressioin of ILD.  CBC and CMP were drawn on 06/17/2020 and were reviewed with the patient today in the office.  She will be due to update lab work in February and every 3 months to monitor for drug toxicity.  TB Gold was negative on 02/02/2020 and will continue to be monitored yearly. She has not had any recent infections.  We discussed the importance of holding Enbrel and Imuran if she develops signs or symptoms of an infection and to resume once the infection has completely cleared.  She has received with 718 vaccinations and is planning on receiving a booster dose.  She was encouraged to continue to wear a mask and social distance.  She plans on trying to continue to work from home indefinitely.   Jo-1 antibody positive  ILD (interstitial lung disease) ([place]) - Examined by [name] on 05/25/20.  Continue breo and albuterol as needed.    Bronchiectasis without complication ([place]): Followed by [name]. Her most recent appointment was on 05/25/20. She has noticed some mild increase in symptoms over the past 1-2 weeks, which she attributes to weather changes. She continues to use breo and albuterol as needed.  She has noticed increased fatigue recently. She was advised to check her pulse ox throughout the day.    Bilateral carpal tunnel syndrome - She is followed by Dr. Fredna Dow.   Primary osteoarthritis of both hands: She has PIP and DIP thickening consistent with  osteoarthritis of both hands.  She has tenderness of the right first PIP joint but no inflammation.  She is able to make a complete fist bilaterally.  Joint protection and muscle strengthening were discussed.  Primary osteoarthritis of both feet: She is not having any discomfort in her feet at this time.  She has good range of motion of both ankle joints with no tenderness or inflammation.  Fibromyalgia: She has been experiencing increased myalgias and muscle tenderness due to underlying fibromyalgia over the past several weeks.  She attributes her increased myalgias to weather changes.  She is also noticed increased fatigue over the past couple of weeks.  We discussed the importance of regular exercise and good sleep hygiene.  Other fatigue: She has been experiencing increased fatigue over the past 2 weeks.  We discussed the importance of regular exercise and good sleep hygiene. She was also encouraged to start checking her pulse ox more regularly throughout the day.  Vitamin D deficiency: She is taking vitamin D 2,000 units daily.   Erythema nodosum: No recurrence.   Other medical conditions are listed as follows:  Fibromuscular dysplasia (HCC)  History of mitral valve prolapse  History of migraine     Orders: Orders Placed This Encounter  Procedures  . CK   Meds ordered this encounter  Medications  . Etanercept (ENBREL MINI) 50 MG/ML SOCT    Sig: Inject 50 mg into the skin once a week.    Dispense:  12 mL    Refill:  0    Follow-Up Instructions: Return in about 3 months (around 09/22/2020) for Rheumatoid arthritis.   Ofilia Neas, PA-C  Note - This record has been created using Dragon software.  Chart creation errors have been sought, but may not always  have been located. Such creation errors do not reflect on  the standard of medical care.

## 2020-06-13 DIAGNOSIS — J471 Bronchiectasis with (acute) exacerbation: Secondary | ICD-10-CM | POA: Diagnosis not present

## 2020-06-13 DIAGNOSIS — G4733 Obstructive sleep apnea (adult) (pediatric): Secondary | ICD-10-CM | POA: Diagnosis not present

## 2020-06-17 ENCOUNTER — Other Ambulatory Visit: Payer: Self-pay | Admitting: *Deleted

## 2020-06-17 DIAGNOSIS — E559 Vitamin D deficiency, unspecified: Secondary | ICD-10-CM | POA: Diagnosis not present

## 2020-06-17 DIAGNOSIS — Z79899 Other long term (current) drug therapy: Secondary | ICD-10-CM

## 2020-06-18 LAB — VITAMIN D 25 HYDROXY (VIT D DEFICIENCY, FRACTURES): Vit D, 25-Hydroxy: 34 ng/mL (ref 30–100)

## 2020-06-18 LAB — CBC WITH DIFFERENTIAL/PLATELET
Absolute Monocytes: 686 cells/uL (ref 200–950)
Basophils Absolute: 29 cells/uL (ref 0–200)
Basophils Relative: 0.4 %
Eosinophils Absolute: 117 cells/uL (ref 15–500)
Eosinophils Relative: 1.6 %
HCT: 43.4 % (ref 35.0–45.0)
Hemoglobin: 13.8 g/dL (ref 11.7–15.5)
Lymphs Abs: 1606 cells/uL (ref 850–3900)
MCH: 27.1 pg (ref 27.0–33.0)
MCHC: 31.8 g/dL — ABNORMAL LOW (ref 32.0–36.0)
MCV: 85.1 fL (ref 80.0–100.0)
MPV: 11.3 fL (ref 7.5–12.5)
Monocytes Relative: 9.4 %
Neutro Abs: 4862 cells/uL (ref 1500–7800)
Neutrophils Relative %: 66.6 %
Platelets: 262 10*3/uL (ref 140–400)
RBC: 5.1 10*6/uL (ref 3.80–5.10)
RDW: 12.4 % (ref 11.0–15.0)
Total Lymphocyte: 22 %
WBC: 7.3 10*3/uL (ref 3.8–10.8)

## 2020-06-18 LAB — COMPLETE METABOLIC PANEL WITH GFR
AG Ratio: 1.4 (calc) (ref 1.0–2.5)
ALT: 14 U/L (ref 6–29)
AST: 16 U/L (ref 10–35)
Albumin: 4.4 g/dL (ref 3.6–5.1)
Alkaline phosphatase (APISO): 75 U/L (ref 37–153)
BUN: 14 mg/dL (ref 7–25)
CO2: 26 mmol/L (ref 20–32)
Calcium: 9.8 mg/dL (ref 8.6–10.4)
Chloride: 105 mmol/L (ref 98–110)
Creat: 0.73 mg/dL (ref 0.50–1.05)
GFR, Est African American: 108 mL/min/{1.73_m2} (ref >=60)
GFR, Est Non African American: 93 mL/min/{1.73_m2} (ref >=60)
Globulin: 3.2 g/dL (calc) (ref 1.9–3.7)
Glucose, Bld: 88 mg/dL (ref 65–139)
Potassium: 4.6 mmol/L (ref 3.5–5.3)
Sodium: 139 mmol/L (ref 135–146)
Total Bilirubin: 0.3 mg/dL (ref 0.2–1.2)
Total Protein: 7.6 g/dL (ref 6.1–8.1)

## 2020-06-19 NOTE — Progress Notes (Signed)
Vitamin D is normal now.  Patient should take vitamin D 2000 units daily.  CBC and CMP normal.

## 2020-06-21 ENCOUNTER — Other Ambulatory Visit: Payer: Self-pay | Admitting: Pulmonary Disease

## 2020-06-22 ENCOUNTER — Encounter: Payer: Self-pay | Admitting: Physician Assistant

## 2020-06-22 ENCOUNTER — Ambulatory Visit: Payer: Federal, State, Local not specified - PPO | Admitting: Physician Assistant

## 2020-06-22 ENCOUNTER — Other Ambulatory Visit: Payer: Self-pay

## 2020-06-22 VITALS — BP 123/81 | HR 76 | Resp 15 | Ht 68.0 in | Wt 281.0 lb

## 2020-06-22 DIAGNOSIS — M797 Fibromyalgia: Secondary | ICD-10-CM

## 2020-06-22 DIAGNOSIS — R768 Other specified abnormal immunological findings in serum: Secondary | ICD-10-CM

## 2020-06-22 DIAGNOSIS — G5603 Carpal tunnel syndrome, bilateral upper limbs: Secondary | ICD-10-CM

## 2020-06-22 DIAGNOSIS — M19072 Primary osteoarthritis, left ankle and foot: Secondary | ICD-10-CM

## 2020-06-22 DIAGNOSIS — Z8669 Personal history of other diseases of the nervous system and sense organs: Secondary | ICD-10-CM

## 2020-06-22 DIAGNOSIS — J849 Interstitial pulmonary disease, unspecified: Secondary | ICD-10-CM

## 2020-06-22 DIAGNOSIS — I773 Arterial fibromuscular dysplasia: Secondary | ICD-10-CM

## 2020-06-22 DIAGNOSIS — M3320 Polymyositis, organ involvement unspecified: Secondary | ICD-10-CM | POA: Diagnosis not present

## 2020-06-22 DIAGNOSIS — M19042 Primary osteoarthritis, left hand: Secondary | ICD-10-CM

## 2020-06-22 DIAGNOSIS — L52 Erythema nodosum: Secondary | ICD-10-CM

## 2020-06-22 DIAGNOSIS — Z79899 Other long term (current) drug therapy: Secondary | ICD-10-CM

## 2020-06-22 DIAGNOSIS — M19041 Primary osteoarthritis, right hand: Secondary | ICD-10-CM

## 2020-06-22 DIAGNOSIS — E559 Vitamin D deficiency, unspecified: Secondary | ICD-10-CM

## 2020-06-22 DIAGNOSIS — R5383 Other fatigue: Secondary | ICD-10-CM

## 2020-06-22 DIAGNOSIS — Z8679 Personal history of other diseases of the circulatory system: Secondary | ICD-10-CM

## 2020-06-22 DIAGNOSIS — M19071 Primary osteoarthritis, right ankle and foot: Secondary | ICD-10-CM

## 2020-06-22 DIAGNOSIS — J479 Bronchiectasis, uncomplicated: Secondary | ICD-10-CM

## 2020-06-22 DIAGNOSIS — R7689 Other specified abnormal immunological findings in serum: Secondary | ICD-10-CM

## 2020-06-22 DIAGNOSIS — M0579 Rheumatoid arthritis with rheumatoid factor of multiple sites without organ or systems involvement: Secondary | ICD-10-CM | POA: Diagnosis not present

## 2020-06-22 MED ORDER — ENBREL MINI 50 MG/ML ~~LOC~~ SOCT: 50.0000 mg | SUBCUTANEOUS | 0 refills | Status: DC

## 2020-06-22 NOTE — Patient Instructions (Signed)
Standing Labs We placed an order today for your standing lab work.   Please have your standing labs drawn in February and every 3 months    If possible, please have your labs drawn 2 weeks prior to your appointment so that the provider can discuss your results at your appointment.  We have open lab daily Monday through Thursday from 8:30-12:30 PM and 1:30-4:30 PM and Friday from 8:30-12:30 PM and 1:30-4:00 PM at the office of Dr. [name] Deveshwar, [facility].   Please be advised, patients with office appointments requiring lab work will take precedents over walk-in lab work.  If possible, please come for your lab work on Monday and Friday afternoons, as you may experience shorter wait times. The office is located at [address], [address], [place], [place] [address] No appointment is necessary.   Labs are drawn by [facility]. Please bring your co-pay at the time of your lab draw.  You may receive a bill from [facility] for your lab work.  If you wish to have your labs drawn at another location, please call the office 24 hours in advance to send orders.  If you have any questions regarding directions or hours of operation,  please call [phone].   As a reminder, please drink plenty of water prior to coming for your lab work. Thanks!   

## 2020-06-23 LAB — CK: Total CK: 59 U/L (ref 29–143)

## 2020-06-23 NOTE — Progress Notes (Signed)
CK WNL

## 2020-06-24 ENCOUNTER — Telehealth: Payer: Self-pay | Admitting: Rheumatology

## 2020-06-24 ENCOUNTER — Other Ambulatory Visit: Payer: Self-pay | Admitting: Rheumatology

## 2020-06-24 NOTE — Telephone Encounter (Signed)
Patient advised she may come to the office to pick up a sample.

## 2020-06-24 NOTE — Telephone Encounter (Signed)
Patient due for next dose of Enbrel today, but has not received rx. Patient would like to pick up a sample.

## 2020-06-28 DIAGNOSIS — G4733 Obstructive sleep apnea (adult) (pediatric): Secondary | ICD-10-CM | POA: Diagnosis not present

## 2020-06-28 DIAGNOSIS — J471 Bronchiectasis with (acute) exacerbation: Secondary | ICD-10-CM | POA: Diagnosis not present

## 2020-07-28 DIAGNOSIS — J471 Bronchiectasis with (acute) exacerbation: Secondary | ICD-10-CM | POA: Diagnosis not present

## 2020-07-28 DIAGNOSIS — G4733 Obstructive sleep apnea (adult) (pediatric): Secondary | ICD-10-CM | POA: Diagnosis not present

## 2020-08-03 ENCOUNTER — Encounter: Payer: Self-pay | Admitting: *Deleted

## 2020-08-03 ENCOUNTER — Telehealth: Payer: Self-pay

## 2020-08-03 NOTE — Telephone Encounter (Signed)
Patient called stating her work from home note is expiring and is requesting a new note.  Patient requested a return call.

## 2020-08-03 NOTE — Telephone Encounter (Signed)
Ok to provide updated work from home note.

## 2020-08-03 NOTE — Telephone Encounter (Signed)
Patient advised we are able to provide her with the requested letter and she may come by to pick the letter.

## 2020-08-24 ENCOUNTER — Other Ambulatory Visit: Payer: Self-pay | Admitting: Physician Assistant

## 2020-08-24 DIAGNOSIS — M0579 Rheumatoid arthritis with rheumatoid factor of multiple sites without organ or systems involvement: Secondary | ICD-10-CM

## 2020-08-28 DIAGNOSIS — J471 Bronchiectasis with (acute) exacerbation: Secondary | ICD-10-CM | POA: Diagnosis not present

## 2020-08-28 DIAGNOSIS — G4733 Obstructive sleep apnea (adult) (pediatric): Secondary | ICD-10-CM | POA: Diagnosis not present

## 2020-09-05 ENCOUNTER — Telehealth: Payer: Self-pay | Admitting: *Deleted

## 2020-09-05 NOTE — Telephone Encounter (Signed)
Spoke with patient and advised her accomodation paperwork has been completed. Patient states she will come by to pick up paperwork and she will fax it. Copy sent to scan center.

## 2020-09-07 ENCOUNTER — Other Ambulatory Visit: Payer: Self-pay

## 2020-09-07 DIAGNOSIS — M0579 Rheumatoid arthritis with rheumatoid factor of multiple sites without organ or systems involvement: Secondary | ICD-10-CM

## 2020-09-07 MED ORDER — ENBREL MINI 50 MG/ML ~~LOC~~ SOCT
50.0000 mg | SUBCUTANEOUS | 0 refills | Status: DC
Start: 2020-09-07 — End: 2020-11-25

## 2020-09-07 NOTE — Telephone Encounter (Signed)
Last Visit: 06/22/2020 Next Visit: 09/29/2020 Labs: 06/17/2020, Vitamin D is normal now. Patient should take vitamin D 2000 units daily. CBC and CMP normal. TB Gold: 629/2021, TB gold negative.   Current Dose per office note 06/22/2020, Enbrel 50 mg sq injections once weekly   NG:[id number] arthritis involving multiple sites with positive rheumatoid factor  Okay to refill Enbrel?

## 2020-09-16 NOTE — Progress Notes (Signed)
Office Visit Note  Patient: Jill Harper             Date of Birth: Apr 05, 1966           MRN: [id number]             PCP: Binnie Rail, MD Referring: Binnie Rail, MD Visit Date: 09/29/2020 Occupation: @GUAROCC @  Subjective:  Myalgias   History of Present Illness: Jill Harper is a 55 y.o. female with history of polymyositis, ILD, fibromyalgia and rheumatoid arthritis.  Patient is currently on Enbrel 50 mg subcu injections once weekly and Imuran 100 mg by mouth daily.  She denies any recent rheumatoid arthritis flares.  She states that the pain and swelling in her right thumb has resolved.  She has not had any morning stiffness or nocturnal pain.  She feels that the current treatment regimen is managing her arthritis well. She has noticed an intermittent rash develop on the dorsal aspect of both hands, which is self resolving.  The rash is mildly raised but is not itchy or painful.  She has been unable to identify a trigger for the rash. She has not been using any new products.   She continues to experience occasional myalgias, muscle weakness, and muscle fatigue.  She states it is difficult for her to identify if her symptoms are consistent with fibromyalgia or polymyositis.  She continues to experience intermittent SOB on exertion.  She remains on 2L of oxygen at night through CPAP. She needs to schedule an appointment with [facility] pulmonologist.  She has been experiencing an increased frequency of migraines recently.  She was evaluated by Dr. Jaynee Eagles recently who recommended increasing the frequency of Ajovy injections to every 3 weeks.  She denies any recent infections.      Activities of Daily Living:  Patient reports morning stiffness for 0 minutes.   Patient Denies nocturnal pain.  Difficulty dressing/grooming: Denies Difficulty climbing stairs: Reports Difficulty getting out of chair: Reports Difficulty using hands for taps, buttons, cutlery, and/or writing: Reports  Review of  Systems  Constitutional: Positive for fatigue.  HENT: Negative for mouth sores, mouth dryness and nose dryness.   Eyes: Negative for pain, redness, itching, visual disturbance and dryness.  Respiratory: Positive for cough, shortness of breath and difficulty breathing. Negative for wheezing.   Cardiovascular: Positive for chest pain. Negative for palpitations.  Gastrointestinal: Negative for blood in stool, constipation and diarrhea.  Endocrine: Negative for increased urination.  Genitourinary: Negative for difficulty urinating.  Musculoskeletal: Positive for arthralgias, joint pain, joint swelling and muscle tenderness. Negative for myalgias, morning stiffness and myalgias.  Skin: Positive for color change. Negative for rash and redness.  Allergic/Immunologic: Positive for susceptible to infections.  Neurological: Positive for numbness, headaches and weakness. Negative for dizziness and memory loss.  Hematological: Positive for bruising/bleeding tendency.  Psychiatric/Behavioral: Negative for confusion.    PMFS History:  Patient Active Problem List   Diagnosis Date Noted  . Occipital neuralgia of left side 01/06/2020  . Bilateral carpal tunnel syndrome 07/21/2019  . Costochondritis 03/16/2019  . Rib pain on right side 03/16/2019  . Insulin resistance 11/27/2018  . Class 3 severe obesity with serious comorbidity and body mass index (BMI) of 40.0 to 44.9 in adult [facility]) 11/27/2018  . Fibromuscular dysplasia ([place]) 10/17/2018  . Hypertension 10/17/2018  . Bronchiectasis with (acute) exacerbation ([place]) 10/08/2018  . OSA (obstructive sleep apnea) 08/20/2018  . Rheumatoid arthritis ([place]) 04/17/2018  . ILD (interstitial lung disease) ([place]) 04/17/2018  .  Dyspnea 04/01/2018  . Bronchiectasis ([place]) 09/02/2017  . LVH (left ventricular hypertrophy) 06/02/2017  . Mitral regurgitation 05/22/2017  . Jo-1 antibody positive 01/29/2017  . Primary osteoarthritis of both feet 01/29/2017  . Primary  osteoarthritis of both hands 01/29/2017  . Left shoulder tendonitis 01/17/2017  . Vitamin D deficiency 04/26/2016  . Cough 07/18/2015  . MVP (mitral valve prolapse)   . Erythema nodosum   . INCONTINENCE, URGE 05/26/2010  . Migraine headache 02/15/2010  . Fibromyalgia 06/20/2009  . Polymyositis ([place]) 02/11/2008  . BRONCHIECTASIS 02/10/2008    Past Medical History:  Diagnosis Date  . Abnormal liver function test   . Bronchiectasis       . Dyspnea   . Fibromuscular dysplasia ([place])   . Fibromyalgia   . Heart murmur   . ILD (interstitial lung disease) ([place])   . Joint pain   . Menorrhagia   . MIGRAINE HEADACHE   . MVP (mitral valve prolapse)   . Osteoarthritis   . Polymyositis ([place])    Dr [name]; chronic MTX  . Pulmonary fibrosis ([place])   . Rheumatoid arthritis ([place])   . Sleep apnea   . Vitamin B 12 deficiency   . Vitamin D deficiency     Family History  Problem Relation Age of Onset  . Diabetes Mother   . Fibromyalgia Mother   . Ulcers Mother   . Heart failure Mother   . Thyroid disease Mother   . Obesity Mother   . Multiple myeloma Father   . Hypertension Father   . Stroke Father   . Lupus Sister   . Other Sister        abdominal adhesions resulting in bowel obstruction  . Migraines Sister   . Heart disease Brother        bypass surgery  . Rheum arthritis Sister   . Multiple myeloma Sister   . Hypertension Sister   . Heart attack Sister   . Diabetes Sister   . Hypertension Sister   . Rheum arthritis Sister   . Diabetes Sister   . Diabetes Brother   . Headache Other        siblings with headaches but not diagnosed as migraines   Past Surgical History:  Procedure Laterality Date  . ablation uterine  2010  . CARDIAC CATHETERIZATION  2002   normal  . DILATION AND CURETTAGE OF UTERUS  08-31-08   Dr [name]  . IR ANGIO INTRA EXTRACRAN SEL COM CAROTID INNOMINATE BILAT MOD SED  09/18/2018  . IR ANGIO VERTEBRAL SEL VERTEBRAL BILAT MOD SED   09/18/2018  . IR US GUIDE VASC ACCESS RIGHT  09/18/2018   Social History   Social History Narrative   Exercise: trying to walk - limited by fatigue   Lives at home with husband    Right handed   Caffeine: none    Immunization History  Administered Date(s) Administered  . Influenza Split 04/10/2011, 06/18/2012, 05/06/2013  . Influenza Whole 05/19/2008, 05/15/2009, 06/01/2010  . Influenza,inj,Quad PF,6+ Mos 04/20/2014, 05/26/2015, 05/31/2016, 04/05/2017, 06/16/2018, 04/22/2019, 04/22/2020  . PFIZER(Purple Top)SARS-COV-2 Vaccination 10/27/2019, 11/19/2019  . Pneumococcal Conjugate-13 01/29/2017  . Pneumococcal Polysaccharide-23 08/07/2007, 06/16/2018  . Td 07/12/2009  . Tdap 01/06/2020     Objective: Vital Signs: BP (!) 141/83 (BP Location: Left Arm, Patient Position: Sitting, Cuff Size: Large)   Pulse 79   Resp 17   Ht 5' 8"  (1.727 m)   Wt 273 lb (123.8 kg)   BMI 41.51 kg/m  Physical Exam Vitals and nursing note reviewed.  Constitutional:      Appearance: She is well-developed and well-nourished.  HENT:     Head: Normocephalic and atraumatic.  Eyes:     Extraocular Movements: EOM normal.     Conjunctiva/sclera: Conjunctivae normal.  Cardiovascular:     Pulses: Intact distal pulses.  Pulmonary:     Effort: Pulmonary effort is normal.  Abdominal:     Palpations: Abdomen is soft.  Musculoskeletal:     Cervical back: Normal range of motion.  Skin:    General: Skin is warm and dry.     Capillary Refill: Capillary refill takes less than 2 seconds.  Neurological:     Mental Status: She is alert and oriented to person, place, and time.  Psychiatric:        Mood and Affect: Mood and affect normal.        Behavior: Behavior normal.      Musculoskeletal Exam: C-spine, thoracic spine, and lumbar spine good ROM with no discomfort. Shoulder joints, elbow joints, wrist joints, MCPs, PIPs, and DIPs good ROM with no synovitis.  Complete fist formation bilaterally. Synovial  thickening over the right 1st PIP joint. Hip joints good ROM with no discomfort.  Knee joints good ROM with no discomfort.  No warmth or effusion of knee joints. Ankle joints good ROM with no tenderness or inflammation.  Full strength of upper and lower extremities.   CDAI Exam: CDAI Score: 0.8  Patient Global: 4 mm; Provider Global: 4 mm Swollen: 0 ; Tender: 0  Joint Exam 09/29/2020   No joint exam has been documented for this visit   There is currently no information documented on the homunculus. Go to the Rheumatology activity and complete the homunculus joint exam.  Investigation: No additional findings.  Imaging: No results found.  Recent Labs: Lab Results  Component Value Date   WBC 8.0 09/23/2020   HGB 14.1 09/23/2020   PLT 286 09/23/2020   NA 140 09/23/2020   K 4.7 09/23/2020   CL 106 09/23/2020   CO2 28 09/23/2020   GLUCOSE 85 09/23/2020   BUN 13 09/23/2020   CREATININE 0.64 09/23/2020   BILITOT 0.3 09/23/2020   ALKPHOS 76 10/12/2019   AST 17 09/23/2020   ALT 19 09/23/2020   PROT 8.0 09/23/2020   ALBUMIN 4.6 10/12/2019   CALCIUM 10.2 09/23/2020   GFRAA 117 09/23/2020   QFTBGOLDPLUS NEGATIVE 02/02/2020    Speciality Comments: Prior therapy includes: MTX (d/c due to ILD progresssion)  Procedures:  No procedures performed Allergies: Sulfonamide derivatives       Assessment / Plan:     Visit Diagnoses: Rheumatoid arthritis involving multiple sites with positive rheumatoid factor (HCC) - +RF, +CCP: She has no joint tenderness or synovitis on exam.  She has not had any recent rheumatoid arthritis flares.  She is clinically been doing well on Enbrel 50 mg subcutaneous injections once weekly and Imuran 100 mg by mouth daily.  The discomfort and inflammation in her right thumb has resolved.  She has thickening over the right first PIP joint but no synovitis was noted.  She has not been experiencing any morning stiffness or nocturnal pain.  She has not had any  increased frequency of infections since starting on combination therapy.  She will continue on the current treatment regimen.  She does not need any refills at this time.  She was advised to notify us if she develops increased joint pain or joint swelling.  She  will follow-up in the office in 5 months.  Polymyositis ([place]) - Dx by Dr. Charlestine Night 2007, CK 801, Jo-1 positive: She has not had any signs or symptoms of a polymyositis flare recently.  Her CK has been within normal limits since 01/31/2017 in epic.  Her most recent CK was 57 on 09/23/2020.  She continues to experience intermittent myalgias and muscle fatigue secondary to fibromyalgia.  She had full strength of upper and lower extremities on examination today.  I discussed the difference between muscle fatigue and muscle weakness so she is able to determine if she is having a fibromyalgia flare or a polymyositis flare.  She will continue on enbrel and imuran as prescribed.   High risk medication use - Enbrel 50 mg sq injections once weekly (02/18/20-1st injection). Imuran 100 mg by mouth daily. d/c MXT due to progressioin of ILD.  CBC and CMP WNL on 09/23/20.  CK WNL-57 on 09/23/20. She will be due to update lab work in May and every 3 months. Standing orders for CBC, CMP, and CK are in place.  She has not had any recent infections.  Discussed the importance of holding Enbrel and Imuran if she develops signs or symptoms of an infection and to resume once the infection has completely cleared. She voiced understanding. She was advised to schedule a yearly skin exam due to increased risk for skin cancer while on enbrel.   Jo-1 antibody positive  ILD (interstitial lung disease) ([place]): Examined by [name] on 05/25/20.  Continue breo and albuterol as needed.  PFT and 6-minute walk test updated on 03/22/20. High resolution CT on 03/01/20. She remains on 2L of oxygen at night with her CPAP.  Advised to call [facility] to schedule appointment for routine  follow up.   Bronchiectasis without complication [facility]): She experiences a cough and SOB on exertion intermittently. Advised to schedule follow up with [name].   Bilateral carpal tunnel syndrome - Asymptomatic at this time.  Primary osteoarthritis of both hands: She has no joint tenderness or inflammation on exam.  Discussed the importance of joint protection and muscle strengthening.   Primary osteoarthritis of both feet: she is not experiencing any discomfort in her feet at this time.  She has good ROM of both ankle joints with no tenderness or inflammation.  Fibromyalgia: She experiences intermittent myalgias and muscle tenderness due to fibromyalgia.  Discussed the difference between muscle weakness and muscle fatigue in order for the patient to try to determine if her flares are due to fibromyalgia or polymyositis.  She had no muscle weakness on exam.  CK was WNL on 09/23/20.  Discussed the importance of regular exercise and good sleep hygiene.   Other fatigue: Chronic but stable.   History of migraine: She has been experiencing any increased frequency of breakthrough migraines recently. She was evaluated by Dr. Jaynee Eagles on 09/22/20 who recommended increasing the frequency of ajovy injections to every 3 weeks and to try nurtec.   Vitamin D deficiency: She is taking vitamin D 2,000 units daily.   Erythema nodosum: No recurrence.   Other medical conditions are listed as follows:   Fibromuscular dysplasia ([place])  History of mitral valve prolapse   Orders: No orders of the defined types were placed in this encounter.  No orders of the defined types were placed in this encounter.    Follow-Up Instructions: Return in about 5 months (around 02/26/2021) for Polymyositis, Rheumatoid arthritis.   Ofilia Neas, PA-C  Note - This record  has been created using [employer].  Chart creation errors have been sought, but may not always  have been located. Such creation errors do not  reflect on  the standard of medical care.

## 2020-09-19 DIAGNOSIS — Z7689 Persons encountering health services in other specified circumstances: Secondary | ICD-10-CM | POA: Diagnosis not present

## 2020-09-19 DIAGNOSIS — Z01419 Encounter for gynecological examination (general) (routine) without abnormal findings: Secondary | ICD-10-CM | POA: Diagnosis not present

## 2020-09-19 DIAGNOSIS — Z1231 Encounter for screening mammogram for malignant neoplasm of breast: Secondary | ICD-10-CM | POA: Diagnosis not present

## 2020-09-22 ENCOUNTER — Ambulatory Visit: Payer: Federal, State, Local not specified - PPO | Admitting: Neurology

## 2020-09-22 ENCOUNTER — Encounter: Payer: Self-pay | Admitting: Neurology

## 2020-09-22 VITALS — BP 132/84 | HR 75 | Ht 68.0 in | Wt 280.0 lb

## 2020-09-22 DIAGNOSIS — G43711 Chronic migraine without aura, intractable, with status migrainosus: Secondary | ICD-10-CM

## 2020-09-22 MED ORDER — AJOVY 225 MG/1.5ML ~~LOC~~ SOAJ: 225.0000 mg | SUBCUTANEOUS | 11 refills | Status: DC

## 2020-09-22 MED ORDER — NURTEC 75 MG PO TBDP: 75.0000 mg | ORAL_TABLET | Freq: Every day | ORAL | 6 refills | Status: DC | PRN

## 2020-09-22 NOTE — Patient Instructions (Signed)
Nurtec as needed for acute migraine Ajovy every 3 weeks Email if you want to change to [employer]

## 2020-09-22 NOTE — Progress Notes (Signed)
[facility]    Provider:  Dr Jill Harper Referring Provider: Estanislado Pandy, MD Primary Care Physician:  Jill Rail, MD  CC:  Headache,FMD, autoimmune disease  When she has a headache/migraine the relpax works. She may have to take 2 doses. Sometimes tylenol will work alone. She has weired feeling and stabbing pain sometimes and lasts a few minutes in the left parietal area. She feels the Ajovy is not as effective, wears out, we discussed changing to Emgality or possibly taking Ajovy q3weeks, gave her sampes, she will try Ajovt q3weeks. Try Nurtec.    Interval history September 23, 2019: Patient here for follow-up, she was last seen in December for hand pain and EMG nerve conduction study did show carpal tunnel syndrome.  PMHx vitamin B12 deficiency, sleep apnea, rheumatoid arthritis, pulmonary fibrosis, polymyositis, osteoarthritis, migraines, joint pain, interstitial lung disease, fibromyalgia, fibromuscular dysplasia, bronchiectasis and abnormal liver function test. She saw Dr. Donzetta Harper and vein in vascular for discoloration of her skin, she gets blue tips of fingers and nails, diagnosed with raynaud's phenomenon related to her RA, no vascular intervention, reviewed his notes and discussed with patient as above. She saw Dr. Mervin Harper for the CTS and they are going to monitor, the symptoms have subsided and her symptoms were slight atypical for CTS on the right, she had ulnar neuropathy on the left and she noticed she did have symptoms and she was given prednisone which helped but it still some and goes, Ajovy working for migraines not daily headaches, she can have an occassional headache and it generally subsides on its own occipital on the left feels hot.   Interval history 07/20/2019: Patient here for a new chief complaint as requested by Dr. Estanislado Harper for hand pain. PMHx vitamin B12 deficiency, sleep apnea, rheumatoid arthritis, pulmonary fibrosis, polymyositis, osteoarthritis, migraines,  joint pain, interstitial lung disease, fibromyalgia, fibromuscular dysplasia, bronchiectasis and abnormal liver function test.  In August or September, she had hand pain. Her hands felt cool, right > left, she started getting cramping in the right hand, not significantly painful just cramped, difficult time straightening it, massage helped. A month later it happened again, more painful, and she could see it "drawing", started getting colder to the touch, she woke up with excruciating pain in the hand and forearm ventrally and she was cramped again. She took 2 alleve. Severe, some numbness, pain felt like tightness, severe and visibly "drawing" up. Massaging helped finger by finger. MRI cervical spine was normal.  She has weakness in her hands. Progressively worsening. Pain in both shoulders. Some numbness in the first 4 digits. No prior diagnosis of CTS. No injury. She also felt a big knot on the inside of her wrist. Like a muscle spasm. No other focal neurologic deficits, associated symptoms, inciting events or modifiable factors.   Personally reviewed MRI cervical spine 07/08/2019: Normal  Cbc/cmp normal 03/2019  Interval history 06/08/2019: Carotid arteries on CTA c/w FMD. Discussed Fibromuscular Dysplasia, sent to Dr. Estanislado Harper and being followed there. She has sharp pain in the back of the head, stops her because it is so severe, so painful it stops her in her tracks. Leaves her weak afterwards. Can go weeks without it. It is random, unknown what triggers, brief, severe. Also continues to have migraines, left sided, pulsating, pounding, contsant pain, behind the eye, feels she may get ptosis, no lacrimation or rhinorrhea. She has light and sound sensitivity. She has daily headaches, no medication overuse, no aura. She has 8 migraine days a month  with moderate or severe quality and can last 24-72 hours ongoing for > year at this frequency and severity and quality. For FMD and BO meds and a daily asa. Tried  atenolol, relpax, telmisartan, verapamil, nortriptyline, topamax. She has some brain fog, discussed normal aging, chronic pain and lifestyle. She has had neck pain, she has numbness and tingling in the left hand and arm, ongoing for >  Months, failed conservative measures, under the care of a physician.    HPI:  Jill Harper is a 55 y.o. female here as requested by Dr. Quay Harper for headaches. PMHx RA, bronchiectasis, polymyositis on imuran follows with Rheumatology, never smoked, interstitial lung disease, fibromyalgia, migraines, mitral valve prolapse, abnormal liver function test. She was supposed to have a sleep study but did not complete that (per notes Jill Harper).  She reports she has headaches that come on during exertion and dissipate without medication when she slows down or alters activities. These are different from her usual migraines. She just had sleep test a few weeks ago, snoring, stops breathing. She does not have the results. She wakes up at night feeling like she can't breathe. She wakes with with "new" headache in the setting of sleep problems for at least 5-6 months. She may have the same headache later in the day. The headache is a fogginedd across  the forehead and more on the left.She wakes with this headache in the setting if snoring and lung problems. The headache is worse with exertion, walking and talking, worse with valsalva and also with sex/orgasm. Rest helps and sitting still and it dissipates slowly. Can be moderately severe. Here with husband who provides much information.  Headache medications used: Atenolol, Flexeril, Relpax,  Reviewed notes, labs and imaging from outside physicians, which showed:  Personally reviewed images from 2013 and agree with the following   Reviewed [name] notes.  Last time she was seen was 07/28/2018.  Patient has a history of rheumatoid arthritis, polymyositis followed in rheumatology.  Also bronchiectasis.  She was supposed to have a sleep  study that was previously ordered but she did not.  Epworth Sleepiness Scale 7 06/16/2018.  ANA negative, rheumatoid factor XX 9, anti-CCP 213, SCL 70, SSA SSB negative, Jo 1+.  Patient's last echocardiogram 05/29/2017 with ejection fraction 60 to 65%, moderate left ventricular hypertrophy, grade 1 DD.  CT of the sinuses were negative for acute disease.   Called Dr. Juanetta Gosling office and requested results of sleep testing  Review of Systems: Patient complains of symptoms per HPI as well as the following symptoms:headache. Pertinent negatives and positives per HPI. All others negative .   Social History   Socioeconomic History  . Marital status: Married    Spouse name: Ryenn Howeth  . Number of children: 1  . Years of education: Not on file  . Highest education level: High school graduate  Occupational History  . Occupation: Compliant and Injury Clerk  Tobacco Use  . Smoking status: Never Smoker  . Smokeless tobacco: Never Used  . Tobacco comment: Married, lives with spouse. 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 Vaping Use: Never used  Substance and Sexual Activity  . Alcohol use: Not Currently    Comment: maybe a wine cooler once every other year  . Drug use: Never  . Sexual activity: Yes    Birth control/protection: Surgical  Other Topics Concern  . Not on file  Social History Narrative  Exercise: trying to walk - limited by fatigue   Lives at home with husband    Right handed   Caffeine: none    Social Determinants of Health   Financial Resource Strain: Not on file  Food Insecurity: Not on file  Transportation Needs: Not on file  Physical Activity: Not on file  Stress: Not on file  Social Connections: Not on file  Intimate Partner Violence: Not on file    Family History  Problem Relation Age of Onset  . Diabetes Mother   . Fibromyalgia Mother   . Ulcers Mother   . Heart failure Mother   . Thyroid disease Mother   .  Obesity Mother   . Multiple myeloma Father   . Hypertension Father   . Stroke Father   . Lupus Sister   . Other Sister        abdominal adhesions resulting in bowel obstruction  . Migraines Sister   . Heart disease Brother        bypass surgery  . Rheum arthritis Sister   . Multiple myeloma Sister   . Hypertension Sister   . Heart attack Sister   . Diabetes Sister   . Hypertension Sister   . Rheum arthritis Sister   . Diabetes Sister   . Diabetes Brother   . Headache Other        siblings with headaches but not diagnosed as migraines    Past Medical History:  Diagnosis Date  . Abnormal liver function test   . Bronchiectasis       . Dyspnea   . Fibromuscular dysplasia ([place])   . Fibromyalgia   . Heart murmur   . ILD (interstitial lung disease) ([place])   . Joint pain   . Menorrhagia   . MIGRAINE HEADACHE   . MVP (mitral valve prolapse)   . Osteoarthritis   . Polymyositis ([place])    Dr [name]; chronic MTX  . Pulmonary fibrosis ([place])   . Rheumatoid arthritis ([place])   . Sleep apnea   . Vitamin B 12 deficiency   . Vitamin D deficiency     Past Surgical History:  Procedure Laterality Date  . ablation uterine  2010  . CARDIAC CATHETERIZATION  2002   normal  . DILATION AND CURETTAGE OF UTERUS  08-31-08   Dr [name]  . IR ANGIO INTRA EXTRACRAN SEL COM CAROTID INNOMINATE BILAT MOD SED  09/18/2018  . IR ANGIO VERTEBRAL SEL VERTEBRAL BILAT MOD SED  09/18/2018  . IR US GUIDE VASC ACCESS RIGHT  09/18/2018    Current Outpatient Medications  Medication Sig Dispense Refill  . albuterol (PROVENTIL HFA) 108 (90 Base) MCG/ACT inhaler Inhale 2 puffs into the lungs 4 (four) times daily as needed. Reported on 09/03/2015 1 Inhaler 4  . albuterol (PROVENTIL) (2.5 MG/3ML) 0.083% nebulizer solution Take 3 mLs (2.5 mg total) by nebulization every 6 (six) hours as needed for wheezing or shortness of breath. 120 vial 5  . aspirin EC 81 MG tablet Take 81 mg by mouth daily.    [name]  atenolol (TENORMIN) 25 MG tablet TAKE 1/2 TABLET BY MOUTH DAILY AS NEEDED FOR MITRAL PROLAPSE 45 tablet 0  . azaTHIOprine (IMURAN) 50 MG tablet TAKE 2 TABLETS(100 MG) BY MOUTH DAILY 60 tablet 2  . BREO ELLIPTA 100-25 MCG/INH AEPB INHALE 1 PUFF INTO THE LUNGS DAILY 60 each 5  . Cholecalciferol (VITAMIN D) 50 MCG (2000 UT) CAPS Take by mouth.    . diclofenac Sodium (VOLTAREN)  1 % GEL Apply 2-4 grams to affected joint 4 times daily as needed. 400 g 2  . eletriptan (RELPAX) 20 MG tablet Take 1 tablet (20 mg total) by mouth as needed for migraine. 10 tablet 11  . Etanercept (ENBREL MINI) 50 MG/ML SOCT Inject 50 mg into the skin once a week. 12 mL 0  . folic acid (FOLVITE) 1 MG tablet TAKE 2 TABLETS(2 MG) BY MOUTH DAILY 180 tablet 3  . Fremanezumab-vfrm (AJOVY) 225 MG/1.5ML SOAJ Inject 225 mg into the skin every 30 (thirty) days. 1 pen 11  . Fremanezumab-vfrm (AJOVY) 225 MG/1.5ML SOAJ Inject 225 mg into the skin every 30 (thirty) days. 1.5 mL 11  . guaiFENesin (MUCINEX) 600 MG 12 hr tablet Take 2 tablets (1,200 mg total) by mouth 2 (two) times daily as needed for cough or to loosen phlegm. (Patient taking differently: Take 1,200 mg by mouth 2 (two) times daily.)    . metFORMIN (GLUCOPHAGE) 500 MG tablet TAKE 1 TABLET(500 MG) BY MOUTH TWICE DAILY WITH A MEAL 180 tablet 1  . Multiple Vitamins-Minerals (MULTIVITAMIN GUMMIES ADULT PO) Take 2 each by mouth daily.    [name] Respiratory Therapy Supplies (FLUTTER) DEVI Use as directed (Patient taking differently: as needed. Use as directed) 1 each 0  . Rimegepant Sulfate (NURTEC) 75 MG TBDP Take 75 mg by mouth daily as needed. For migraines. Take as close to onset of migraine as possible. One daily maximum. 10 tablet 6  . telmisartan (MICARDIS) 20 MG tablet TAKE 1 TABLET(20 MG) BY MOUTH DAILY 90 tablet 1   No current facility-administered medications for this visit.    Allergies as of 09/22/2020 - Review Complete 09/22/2020  Allergen Reaction Noted  . Sulfonamide  derivatives      Vitals: BP 132/84 (BP Location: Left Arm, Patient Position: Sitting)   Pulse 75   Ht 5' 8"  (1.727 m)   Wt 280 lb (127 kg) Comment: pt reported  SpO2 97%   BMI 42.57 kg/m  Last Weight:  Wt Readings from Last 1 Encounters:  09/22/20 280 lb (127 kg)   Last Height:   Ht Readings from Last 1 Encounters:  09/22/20 5' 8"  (1.727 m)   Exam: NAD, pleasant                  Speech:    Speech is normal; fluent and spontaneous with normal comprehension.  Cognition:    The patient is oriented to person, place, and time;     recent and remote memory intact;     language fluent;    Cranial Nerves:    The pupils are equal, round, and reactive to light.Trigeminal sensation is intact and the muscles of mastication are normal. The face is symmetric. The palate elevates in the midline. Hearing intact. Voice is normal. Shoulder shrug is normal. The tongue has normal motion without fasciculations.   Coordination:  No dysmetria  Motor Observation:    No asymmetry, no atrophy, and no involuntary movements noted. Tone:    Normal muscle tone.     Strength:    Strength is V/V in the upper and lower limbs.      Sensation: intact to LT        Assessment/Plan:   55 y.o. female here as requested by Dr. Quay Harper for headaches and now here for new request of evaluation of hand pain. PMHx RA, bronchiectasis, polymyositis on imuran follows with Rheumatology, never smoked, interstitial lung disease, fibromyalgia, migraines, mitral valve prolapse, abnormal liver function  test. She has migraines and occipital neuralgia.   Nurtec as needed for acute migraine Ajovy every 3 weeks Email if you want to change to [employer]  PRIOR:  - EMG/NCS with cts on the right, left ulnar, steroids helped, say dr Fredna Dow and monitoring - Vascular evaluation, likely raynauds or autoimmune, following with Rheumatology - Ajovy working for migraines not daily headaches, she can have an occassional headache, and  it generally subsides on its own occipital on the left feels hot(alsohas occipital neuralgia).  - continues 02 at sleep, she follows with pulmonology - still following with shaili deveshwar not her husband and we can order surveillance imaging CTA head and neck for FMD every 1-2 years last 07/07/2019. Continues ASA and watches blood pressure.  Meds ordered this encounter  Medications  . Fremanezumab-vfrm (AJOVY) 225 MG/1.5ML SOAJ    Sig: Inject 225 mg into the skin every 30 (thirty) days.    Dispense:  1.5 mL    Refill:  11    Please dispense a 3 month supply if insurance allows  . Rimegepant Sulfate (NURTEC) 75 MG TBDP    Sig: Take 75 mg by mouth daily as needed. For migraines. Take as close to onset of migraine as possible. One daily maximum.    Dispense:  10 tablet    Refill:  6    PRIOR:  - She completed sleep evaluation, AHI 5, decreased O2 at nigh, on O2 now at night - FMD: ASA and HTN control, repeat CTA head and neck due to new symptoms - Migraines and occipital neuralgia: Start Ajovy for migraines.  - MRI cervical spine for cervical radic.  She has had neck pain, she has numbness and tingling in the left hand and arm, ongoing for >  6 Months, failed conservative measures, under the care of a physician for years for this.  Needs MRI c-spine for cervical radic as well as cervico-occipital pain. - She has some brain fog, discussed normal aging, chronic pain and lifestyle factors. B12 575.  - Obesity: healthy weight and wellness center referral, doing great as lost 25 pounds. - repeat CTAngio of the head and neck due to pain radiating in the neck, for surveillance of her FMD and to rule out any carotid involvement (feels a "cord" where the carotid artery shows, rule out carotid dissection has FMD in the carotids)  To prevent or relieve headaches, try the following: Cool Compress. Lie down and place a cool compress on your head.  Avoid headache triggers. If certain foods or odors seem  to have triggered your migraines in the past, avoid them. A headache diary might help you identify triggers.  Include physical activity in your daily routine. Try a daily walk or other moderate aerobic exercise.  Manage stress. Find healthy ways to cope with the stressors, such as delegating tasks on your to-do list.  Practice relaxation techniques. Try deep breathing, yoga, massage and visualization.  Eat regularly. Eating regularly scheduled meals and maintaining a healthy diet might help prevent headaches. Also, drink plenty of fluids.  Follow a regular sleep schedule. Sleep deprivation might contribute to headaches Consider biofeedback. With this mind-body technique, you learn to control certain bodily functions - such as muscle tension, heart rate and blood pressure - to prevent headaches or reduce headache pain.    Proceed to emergency room if you experience new or worsening symptoms or symptoms do not resolve, if you have new neurologic symptoms or if headache is severe, or for any concerning symptom.  Provided education and documentation from American headache Society toolbox including articles on: chronic migraine medication overuse headache, chronic migraines, prevention of migraines, behavioral and other nonpharmacologic treatments for headache.    Cc:   Jill Rail, MD, Dr.Deveshwar  Jill Ill, MD  [facility] [address] [place], [place] [address]  Phone [phone] Fax [phone]  I spent 20 minutes of face-to-face and non-face-to-face time with patient on the  1. Chronic migraine without aura, with intractable migraine, so stated, with status migrainosus    diagnosis.  This included previsit chart review, lab review, study review, order entry, electronic health record documentation, patient education on the different diagnostic and therapeutic options, counseling and coordination of care, risks and benefits of management,  compliance, or risk factor reduction

## 2020-09-23 ENCOUNTER — Other Ambulatory Visit: Payer: Self-pay | Admitting: *Deleted

## 2020-09-23 DIAGNOSIS — Z79899 Other long term (current) drug therapy: Secondary | ICD-10-CM

## 2020-09-23 DIAGNOSIS — M3320 Polymyositis, organ involvement unspecified: Secondary | ICD-10-CM | POA: Diagnosis not present

## 2020-09-24 ENCOUNTER — Encounter: Payer: Self-pay | Admitting: Neurology

## 2020-09-24 LAB — CBC WITH DIFFERENTIAL/PLATELET
Absolute Monocytes: 816 cells/uL (ref 200–950)
Basophils Absolute: 32 cells/uL (ref 0–200)
Basophils Relative: 0.4 %
Eosinophils Absolute: 160 cells/uL (ref 15–500)
Eosinophils Relative: 2 %
HCT: 42.5 % (ref 35.0–45.0)
Hemoglobin: 14.1 g/dL (ref 11.7–15.5)
Lymphs Abs: 1680 cells/uL (ref 850–3900)
MCH: 27.9 pg (ref 27.0–33.0)
MCHC: 33.2 g/dL (ref 32.0–36.0)
MCV: 84 fL (ref 80.0–100.0)
MPV: 11 fL (ref 7.5–12.5)
Monocytes Relative: 10.2 %
Neutro Abs: 5312 cells/uL (ref 1500–7800)
Neutrophils Relative %: 66.4 %
Platelets: 286 10*3/uL (ref 140–400)
RBC: 5.06 10*6/uL (ref 3.80–5.10)
RDW: 12.4 % (ref 11.0–15.0)
Total Lymphocyte: 21 %
WBC: 8 10*3/uL (ref 3.8–10.8)

## 2020-09-24 LAB — COMPLETE METABOLIC PANEL WITH GFR
AG Ratio: 1.3 (calc) (ref 1.0–2.5)
ALT: 19 U/L (ref 6–29)
AST: 17 U/L (ref 10–35)
Albumin: 4.5 g/dL (ref 3.6–5.1)
Alkaline phosphatase (APISO): 70 U/L (ref 37–153)
BUN: 13 mg/dL (ref 7–25)
CO2: 28 mmol/L (ref 20–32)
Calcium: 10.2 mg/dL (ref 8.6–10.4)
Chloride: 106 mmol/L (ref 98–110)
Creat: 0.64 mg/dL (ref 0.50–1.05)
GFR, Est African American: 117 mL/min/{1.73_m2} (ref >=60)
GFR, Est Non African American: 101 mL/min/{1.73_m2} (ref >=60)
Globulin: 3.5 g/dL (calc) (ref 1.9–3.7)
Glucose, Bld: 85 mg/dL (ref 65–99)
Potassium: 4.7 mmol/L (ref 3.5–5.3)
Sodium: 140 mmol/L (ref 135–146)
Total Bilirubin: 0.3 mg/dL (ref 0.2–1.2)
Total Protein: 8 g/dL (ref 6.1–8.1)

## 2020-09-24 LAB — CK: Total CK: 57 U/L (ref 29–143)

## 2020-09-26 NOTE — Progress Notes (Signed)
CBC, CMP and CK are normal.

## 2020-09-28 ENCOUNTER — Other Ambulatory Visit: Payer: Self-pay | Admitting: Rheumatology

## 2020-09-28 DIAGNOSIS — G4733 Obstructive sleep apnea (adult) (pediatric): Secondary | ICD-10-CM | POA: Diagnosis not present

## 2020-09-28 DIAGNOSIS — J471 Bronchiectasis with (acute) exacerbation: Secondary | ICD-10-CM | POA: Diagnosis not present

## 2020-09-28 NOTE — Telephone Encounter (Signed)
Last Visit: 06/22/2020  Next Visit: 09/29/2020 Labs: 09/23/2020 CBC, CMP and CK are normal.  Current Dose per office note 06/22/2020: Imuran 100 mg by mouth daily DX: Rheumatoid arthritis involving multiple sites with positive rheumatoid factor  Last Fill: 06/24/2020  Okay to refill per Dr. Estanislado Pandy

## 2020-09-29 ENCOUNTER — Other Ambulatory Visit: Payer: Self-pay

## 2020-09-29 ENCOUNTER — Encounter: Payer: Self-pay | Admitting: Physician Assistant

## 2020-09-29 ENCOUNTER — Ambulatory Visit: Payer: Federal, State, Local not specified - PPO | Admitting: Physician Assistant

## 2020-09-29 VITALS — BP 141/83 | HR 79 | Resp 17 | Ht 68.0 in | Wt 273.0 lb

## 2020-09-29 DIAGNOSIS — R5383 Other fatigue: Secondary | ICD-10-CM

## 2020-09-29 DIAGNOSIS — M0579 Rheumatoid arthritis with rheumatoid factor of multiple sites without organ or systems involvement: Secondary | ICD-10-CM | POA: Diagnosis not present

## 2020-09-29 DIAGNOSIS — Z79899 Other long term (current) drug therapy: Secondary | ICD-10-CM | POA: Diagnosis not present

## 2020-09-29 DIAGNOSIS — R768 Other specified abnormal immunological findings in serum: Secondary | ICD-10-CM | POA: Diagnosis not present

## 2020-09-29 DIAGNOSIS — G5603 Carpal tunnel syndrome, bilateral upper limbs: Secondary | ICD-10-CM

## 2020-09-29 DIAGNOSIS — M19072 Primary osteoarthritis, left ankle and foot: Secondary | ICD-10-CM

## 2020-09-29 DIAGNOSIS — L52 Erythema nodosum: Secondary | ICD-10-CM

## 2020-09-29 DIAGNOSIS — M19041 Primary osteoarthritis, right hand: Secondary | ICD-10-CM

## 2020-09-29 DIAGNOSIS — M19071 Primary osteoarthritis, right ankle and foot: Secondary | ICD-10-CM

## 2020-09-29 DIAGNOSIS — Z8679 Personal history of other diseases of the circulatory system: Secondary | ICD-10-CM

## 2020-09-29 DIAGNOSIS — M19042 Primary osteoarthritis, left hand: Secondary | ICD-10-CM

## 2020-09-29 DIAGNOSIS — J479 Bronchiectasis, uncomplicated: Secondary | ICD-10-CM

## 2020-09-29 DIAGNOSIS — J849 Interstitial pulmonary disease, unspecified: Secondary | ICD-10-CM

## 2020-09-29 DIAGNOSIS — Z8669 Personal history of other diseases of the nervous system and sense organs: Secondary | ICD-10-CM

## 2020-09-29 DIAGNOSIS — M3320 Polymyositis, organ involvement unspecified: Secondary | ICD-10-CM | POA: Diagnosis not present

## 2020-09-29 DIAGNOSIS — E559 Vitamin D deficiency, unspecified: Secondary | ICD-10-CM

## 2020-09-29 DIAGNOSIS — I773 Arterial fibromuscular dysplasia: Secondary | ICD-10-CM

## 2020-09-29 DIAGNOSIS — M797 Fibromyalgia: Secondary | ICD-10-CM

## 2020-09-29 NOTE — Patient Instructions (Signed)
Standing Labs We placed an order today for your standing lab work.   Please have your standing labs drawn in may and every 3 months   If possible, please have your labs drawn 2 weeks prior to your appointment so that the provider can discuss your results at your appointment.  We have open lab daily Monday through Thursday from 1:30-4:30 PM and Friday from 1:30-4:00 PM at the office of Dr. Bo Merino, [facility].   Please be advised, all patients with office appointments requiring lab work will take precedents over walk-in lab work.  If possible, please come for your lab work on Monday and Friday afternoons, as you may experience shorter wait times. The office is located at [address] [place], [place], [place] [address] No appointment is necessary.   Labs are drawn by [facility]. Please bring your co-pay at the time of your lab draw.  You may receive a bill from [facility] for your lab work.  If you wish to have your labs drawn at another location, please call the office 24 hours in advance to send orders.  If you have any questions regarding directions or hours of operation,  please call [phone].   As a reminder, please drink plenty of water prior to coming for your lab work. Thanks!

## 2020-10-18 ENCOUNTER — Other Ambulatory Visit: Payer: Self-pay | Admitting: Neurology

## 2020-10-18 DIAGNOSIS — G43711 Chronic migraine without aura, intractable, with status migrainosus: Secondary | ICD-10-CM

## 2020-10-19 ENCOUNTER — Other Ambulatory Visit: Payer: Self-pay | Admitting: Internal Medicine

## 2020-10-19 DIAGNOSIS — R7303 Prediabetes: Secondary | ICD-10-CM

## 2020-10-20 NOTE — Progress Notes (Signed)
Subjective:    Patient ID: Jill Harper, female    DOB: 08-18-1965, 55 y.o.   MRN: [id number]  HPI The patient is here for follow up of their chronic medical problems, including htn, fibromuscular dysplasia  She is taking all of her medications as prescribed.    She is doing well with enbrel.      Medications and allergies reviewed with patient and updated if appropriate.  Patient Active Problem List   Diagnosis Date Noted  . Occipital neuralgia of left side 01/06/2020  . Bilateral carpal tunnel syndrome 07/21/2019  . Costochondritis 03/16/2019  . Rib pain on right side 03/16/2019  . Insulin resistance 11/27/2018  . Class 3 severe obesity with serious comorbidity and body mass index (BMI) of 40.0 to 44.9 in [facility]) 11/27/2018  . Fibromuscular dysplasia ([place]) 10/17/2018  . Hypertension 10/17/2018  . Bronchiectasis with (acute) exacerbation ([place]) 10/08/2018  . OSA (obstructive sleep apnea) 08/20/2018  . Rheumatoid arthritis ([place]) 04/17/2018  . ILD (interstitial lung disease) ([place]) 04/17/2018  . Dyspnea 04/01/2018  . Bronchiectasis ([place]) 09/02/2017  . LVH (left ventricular hypertrophy) 06/02/2017  . Mitral regurgitation 05/22/2017  . Jo-1 antibody positive 01/29/2017  . Primary osteoarthritis of both feet 01/29/2017  . Primary osteoarthritis of both hands 01/29/2017  . Left shoulder tendonitis 01/17/2017  . Vitamin D deficiency 04/26/2016  . Cough 07/18/2015  . MVP (mitral valve prolapse)   . Erythema nodosum   . INCONTINENCE, URGE 05/26/2010  . Migraine headache 02/15/2010  . Fibromyalgia 06/20/2009  . Polymyositis ([place]) 02/11/2008  . BRONCHIECTASIS 02/10/2008    Current Outpatient Medications on File Prior to Visit  Medication Sig Dispense Refill  . albuterol (PROVENTIL HFA) 108 (90 Base) MCG/ACT inhaler Inhale 2 puffs into the lungs 4 (four) times daily as needed. Reported on 09/03/2015 1 Inhaler 4  . albuterol (PROVENTIL) (2.5 MG/3ML) 0.083% nebulizer  solution Take 3 mLs (2.5 mg total) by nebulization every 6 (six) hours as needed for wheezing or shortness of breath. 120 vial 5  . aspirin EC 81 MG tablet Take 81 mg by mouth daily.    [name] atenolol (TENORMIN) 25 MG tablet TAKE 1/2 TABLET BY MOUTH DAILY AS NEEDED FOR MITRAL PROLAPSE 45 tablet 0  . azaTHIOprine (IMURAN) 50 MG tablet TAKE 2 TABLETS(100 MG) BY MOUTH DAILY 60 tablet 2  . BREO ELLIPTA 100-25 MCG/INH AEPB INHALE 1 PUFF INTO THE LUNGS DAILY 60 each 5  . Cholecalciferol (VITAMIN D) 50 MCG (2000 UT) CAPS Take by mouth.    . diclofenac Sodium (VOLTAREN) 1 % GEL Apply 2-4 grams to affected joint 4 times daily as needed. 400 g 2  . eletriptan (RELPAX) 20 MG tablet TAKE 1 TABLET BY MOUTH AS NEEDED FOR MIGRAINE 10 tablet 11  . Etanercept (ENBREL MINI) 50 MG/ML SOCT Inject 50 mg into the skin once a week. 12 mL 0  . folic acid (FOLVITE) 1 MG tablet TAKE 2 TABLETS(2 MG) BY MOUTH DAILY 180 tablet 3  . Fremanezumab-vfrm (AJOVY) 225 MG/1.5ML SOAJ Inject 225 mg into the skin every 30 (thirty) days. 1.5 mL 11  . guaiFENesin (MUCINEX) 600 MG 12 hr tablet Take 2 tablets (1,200 mg total) by mouth 2 (two) times daily as needed for cough or to loosen phlegm. (Patient taking differently: Take 1,200 mg by mouth 2 (two) times daily.)    . metFORMIN (GLUCOPHAGE) 500 MG tablet TAKE 1 TABLET(500 MG) BY MOUTH TWICE DAILY WITH A MEAL 180 tablet 1  . Multiple  Vitamins-Minerals (MULTIVITAMIN GUMMIES ADULT PO) Take 2 each by mouth daily.    [name] Respiratory Therapy Supplies (FLUTTER) DEVI Use as directed (Patient taking differently: as needed. Use as directed) 1 each 0  . Rimegepant Sulfate (NURTEC) 75 MG TBDP Take 75 mg by mouth daily as needed. For migraines. Take as close to onset of migraine as possible. One daily maximum. 10 tablet 6  . telmisartan (MICARDIS) 20 MG tablet TAKE 1 TABLET(20 MG) BY MOUTH DAILY 90 tablet 1   No current facility-administered medications on file prior to visit.    Past Medical History:   Diagnosis Date  . Abnormal liver function test   . Bronchiectasis       . Dyspnea   . Fibromuscular dysplasia ([place])   . Fibromyalgia   . Heart murmur   . ILD (interstitial lung disease) ([place])   . Joint pain   . Menorrhagia   . MIGRAINE HEADACHE   . MVP (mitral valve prolapse)   . Osteoarthritis   . Polymyositis ([place])    Dr [name]; chronic MTX  . Pulmonary fibrosis ([place])   . Rheumatoid arthritis ([place])   . Sleep apnea   . Vitamin B 12 deficiency   . Vitamin D deficiency     Past Surgical History:  Procedure Laterality Date  . ablation uterine  2010  . CARDIAC CATHETERIZATION  2002   normal  . DILATION AND CURETTAGE OF UTERUS  08-31-08   Dr [name]  . IR ANGIO INTRA EXTRACRAN SEL COM CAROTID INNOMINATE BILAT MOD SED  09/18/2018  . IR ANGIO VERTEBRAL SEL VERTEBRAL BILAT MOD SED  09/18/2018  . IR US GUIDE VASC ACCESS RIGHT  09/18/2018    Social History   Socioeconomic History  . Marital status: Married    Spouse name: Desiraye Rolfson  . Number of children: 1  . Years of education: Not on file  . Highest education level: High school graduate  Occupational History  . Occupation: Compliant and Injury Clerk  Tobacco Use  . Smoking status: Never Smoker  . Smokeless tobacco: Never Used  . Tobacco comment: Married, lives with spouse. 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 Vaping Use: Never used  Substance and Sexual Activity  . Alcohol use: Not Currently    Comment: maybe a wine cooler once every other year  . Drug use: Never  . Sexual activity: Yes    Birth control/protection: Surgical  Other Topics Concern  . Not on file  Social History Narrative   Exercise: trying to walk - limited by fatigue   Lives at home with husband    Right handed   Caffeine: none    Social Determinants of Health   Financial Resource Strain: Not on file  Food Insecurity: Not on file  Transportation Needs: Not on file  Physical  Activity: Not on file  Stress: Not on file  Social Connections: Not on file    Family History  Problem Relation Age of Onset  . Diabetes Mother   . Fibromyalgia Mother   . Ulcers Mother   . Heart failure Mother   . Thyroid disease Mother   . Obesity Mother   . Multiple myeloma Father   . Hypertension Father   . Stroke Father   . Lupus Sister   . Other Sister        abdominal adhesions resulting in bowel obstruction  . Migraines Sister   . Heart disease  Brother        bypass surgery  . Rheum arthritis Sister   . Multiple myeloma Sister   . Hypertension Sister   . Heart attack Sister   . Diabetes Sister   . Hypertension Sister   . Rheum arthritis Sister   . Diabetes Sister   . Diabetes Brother   . Headache Other        siblings with headaches but not diagnosed as migraines    Review of Systems  Constitutional: Negative for chills and fever.  Respiratory: Positive for cough (at baseline), shortness of breath (at baseline) and wheezing (at baseline).   Cardiovascular: Positive for palpitations (occ, transient) and leg swelling (LLE). Negative for chest pain.  Neurological: Positive for headaches.       Objective:   Vitals:   10/21/20 1532  BP: 136/82  Pulse: 79  Temp: 98.3 F (36.8 C)  SpO2: 99%   BP Readings from Last 3 Encounters:  10/21/20 136/82  09/29/20 (!) 141/83  09/22/20 132/84   Wt Readings from Last 3 Encounters:  10/21/20 278 lb (126.1 kg)  09/29/20 273 lb (123.8 kg)  09/22/20 280 lb (127 kg)   Body mass index is 42.27 kg/m.  Depression screen [facility] 2/9 10/21/2020 10/20/2019 10/13/2018 10/09/2018 04/05/2017  Decreased Interest 0 0 0 3 0  Down, Depressed, Hopeless 0 0 0 3 0  PHQ - 2 Score 0 0 0 6 0  Altered sleeping 0 - 3 3 -  Tired, decreased energy 0 - 3 3 -  Change in appetite 0 - 1 1 -  Feeling bad or failure about yourself  0 - 0 3 -  Trouble concentrating 0 - 0 0 -  Moving slowly or fidgety/restless 0 - 3 3 -  Suicidal thoughts 0 - 0 0 -   PHQ-9 Score 0 - 10 19 -  Difficult doing work/chores - - Somewhat difficult Not difficult at all -  Some recent data might be hidden    GAD 7 : Generalized Anxiety Score 10/21/2020  Nervous, Anxious, on Edge 0  Control/stop worrying 0  Worry too much - different things 0  Trouble relaxing 0  Restless 0  Easily annoyed or irritable 0  Afraid - awful might happen 0  Total GAD 7 Score 0       Physical Exam    Constitutional: Appears well-developed and well-nourished. No distress.  HENT:  Head: Normocephalic and atraumatic.  Neck: Neck supple. No tracheal deviation present. No thyromegaly present.  No cervical lymphadenopathy Cardiovascular: Normal rate, regular rhythm and normal heart sounds.   3/6 sys murmur heard. No carotid bruit .  No edema Pulmonary/Chest: Effort normal and breath sounds normal. No respiratory distress. No has no wheezes. No rales.  Skin: Skin is warm and dry. Not diaphoretic.  Psychiatric: Normal mood and affect. Behavior is normal.      Assessment & Plan:   Screened for depression using the PHQ 9 scale.  No evidence of depression.   Screening for anxiety using the GAD-7 scale.  No evidence of anxiety.   See Problem List for Assessment and Plan of chronic medical problems.    This visit occurred during the SARS-CoV-2 public health emergency.  Safety protocols were in place, including screening questions prior to the visit, additional usage of staff PPE, and extensive cleaning of exam room while observing appropriate contact time as indicated for disinfecting solutions.

## 2020-10-21 ENCOUNTER — Ambulatory Visit: Payer: Federal, State, Local not specified - PPO | Admitting: Internal Medicine

## 2020-10-21 ENCOUNTER — Encounter: Payer: Self-pay | Admitting: Internal Medicine

## 2020-10-21 ENCOUNTER — Other Ambulatory Visit: Payer: Self-pay

## 2020-10-21 VITALS — BP 136/82 | HR 79 | Temp 98.3°F | Ht 68.0 in | Wt 278.0 lb

## 2020-10-21 DIAGNOSIS — I10 Essential (primary) hypertension: Secondary | ICD-10-CM

## 2020-10-21 DIAGNOSIS — Z1331 Encounter for screening for depression: Secondary | ICD-10-CM

## 2020-10-21 DIAGNOSIS — G43809 Other migraine, not intractable, without status migrainosus: Secondary | ICD-10-CM | POA: Diagnosis not present

## 2020-10-21 DIAGNOSIS — M797 Fibromyalgia: Secondary | ICD-10-CM | POA: Diagnosis not present

## 2020-10-21 DIAGNOSIS — Z1339 Encounter for screening examination for other mental health and behavioral disorders: Secondary | ICD-10-CM | POA: Diagnosis not present

## 2020-10-21 DIAGNOSIS — M069 Rheumatoid arthritis, unspecified: Secondary | ICD-10-CM

## 2020-10-21 DIAGNOSIS — I340 Nonrheumatic mitral (valve) insufficiency: Secondary | ICD-10-CM

## 2020-10-21 DIAGNOSIS — I341 Nonrheumatic mitral (valve) prolapse: Secondary | ICD-10-CM

## 2020-10-21 DIAGNOSIS — E8881 Metabolic syndrome: Secondary | ICD-10-CM

## 2020-10-21 NOTE — Assessment & Plan Note (Signed)
Chronic Continue metformin 500 mg BID

## 2020-10-21 NOTE — Assessment & Plan Note (Addendum)
Chronic Intermittent palpitations Uses atenolol 12.5 mg daily as needed - has used a few times in the past few months

## 2020-10-21 NOTE — Assessment & Plan Note (Addendum)
Chronic BP well controlled Continue telmisartan 20 mg daily

## 2020-10-21 NOTE — Patient Instructions (Addendum)
   Medications changes include :  none     A Echo was ordered.   Someone from their office will call you to schedule an appointment.    Please followup in 6 months

## 2020-10-21 NOTE — Assessment & Plan Note (Signed)
Chronic Continue Tylenol as needed

## 2020-10-21 NOTE — Assessment & Plan Note (Signed)
Chronic ?  Slightly lower than usual Will order echocardiogram to evaluate-last echo was in 2019

## 2020-10-21 NOTE — Assessment & Plan Note (Signed)
Chronic Improved Management per Dr Estanislado Pandy On Enbrel, Imuran

## 2020-10-21 NOTE — Assessment & Plan Note (Signed)
Chronic Management per Dr Darlys Gales - was not lasting the 4 weeks - doing it every 3 weeks Taking nurtec or / and relpax as needed

## 2020-10-26 ENCOUNTER — Telehealth: Payer: Self-pay | Admitting: *Deleted

## 2020-10-26 DIAGNOSIS — G4733 Obstructive sleep apnea (adult) (pediatric): Secondary | ICD-10-CM | POA: Diagnosis not present

## 2020-10-26 DIAGNOSIS — J471 Bronchiectasis with (acute) exacerbation: Secondary | ICD-10-CM | POA: Diagnosis not present

## 2020-10-26 NOTE — Telephone Encounter (Signed)
Completed [place] PA on Cover My Meds. Key: [id number]. Awaiting determination from [facility] within 1-5 business days.

## 2020-10-31 ENCOUNTER — Telehealth: Payer: Self-pay | Admitting: Neurology

## 2020-10-31 NOTE — Telephone Encounter (Signed)
Pt accepted appointment for this Thursday, 3/31 at 9:00 with Ward Givens, NP.

## 2020-10-31 NOTE — Telephone Encounter (Signed)
Jillian, do Megan or Amy have any openings in the next 2 weeks? I'd like patient seen, and I would like another opinion from [facility] or amy let me know thanks  (Megan/Amy - do you guys have any appointments that you hold of workins? I'd like one of you to lay eyes on this patient for me)

## 2020-11-03 ENCOUNTER — Ambulatory Visit: Payer: Federal, State, Local not specified - PPO | Admitting: Adult Health

## 2020-11-03 ENCOUNTER — Encounter: Payer: Self-pay | Admitting: Adult Health

## 2020-11-03 VITALS — BP 126/75 | HR 67 | Ht 68.0 in | Wt 280.0 lb

## 2020-11-03 DIAGNOSIS — G43709 Chronic migraine without aura, not intractable, without status migrainosus: Secondary | ICD-10-CM

## 2020-11-03 DIAGNOSIS — G4485 Primary stabbing headache: Secondary | ICD-10-CM

## 2020-11-03 MED ORDER — NURTEC 75 MG PO TBDP: 75.0000 mg | ORAL_TABLET | Freq: Every day | ORAL | 6 refills | Status: DC | PRN

## 2020-11-03 MED ORDER — GABAPENTIN 100 MG PO CAPS: 100.0000 mg | ORAL_CAPSULE | Freq: Every day | ORAL | 5 refills | Status: DC

## 2020-11-03 NOTE — Progress Notes (Addendum)
PATIENT: MONZERRATH MCBURNEY DOB: 11/23/1965  REASON FOR VISIT: follow up HISTORY FROM: patient  HISTORY OF PRESENT ILLNESS: Today 11/03/20:  Ms. Faulk is a 55 year old female with a history of migraine headaches.  She is currently taking Ajovy every 3 weeks.  She has Relpax and Nurtec that she rotates nightly.  Reports that she has sharp shooting pains in the left parietal region that radiates to the temporal region.  This only occurs at night usually wakes her up from sleep.  She has had this type of discomfort before however it has become more frequent recently.  She states that it only lasts for several seconds to minutes before it resolves.  Patient states that she began taking Nurtec every other night and reports that when she took Nurtec she did not wake up with this discomfort.  She states that she did not added Relpax and will take it on the nights that she did not take Nurtec and this also eliminated the discomfort at night. the patient wears a CPAP machine with supplemental oxygen at night managed by [name].  She states during the day she may have 3 to 4 days a week that she has a mild headache.  She states that the severity has definitely improved.  She typically does not take anything for these headaches.  She reports that sometimes the headaches can last all day long.  She does feel that Ajovy has improved the severity of her headaches.    HISTORY ,(copied from Dr. Cathren Laine note)  When she has a headache/migraine the relpax works. She may have to take 2 doses. Sometimes tylenol will work alone. She has weired feeling and stabbing pain sometimes and lasts a few minutes in the left parietal area. She feels the Ajovy is not as effective, wears out, we discussed changing to Emgality or possibly taking Ajovy q3weeks, gave her sampes, she will try Ajovt q3weeks. Try Nurtec.    Interval history September 23, 2019: Patient here for follow-up, she was last seen in December for hand pain and EMG  nerve conduction study did show carpal tunnel syndrome.  PMHx vitamin B12 deficiency, sleep apnea, rheumatoid arthritis, pulmonary fibrosis, polymyositis, osteoarthritis, migraines, joint pain, interstitial lung disease, fibromyalgia, fibromuscular dysplasia, bronchiectasis and abnormal liver function test. She saw Dr. Donzetta Matters and vein in vascular for discoloration of her skin, she gets blue tips of fingers and nails, diagnosed with raynaud's phenomenon related to her RA, no vascular intervention, reviewed his notes and discussed with patient as above. She saw Dr. Mervin Hack for the CTS and they are going to monitor, the symptoms have subsided and her symptoms were slight atypical for CTS on the right, she had ulnar neuropathy on the left and she noticed she did have symptoms and she was given prednisone which helped but it still some and goes, Ajovy working for migraines not daily headaches, she can have an occassional headache and it generally subsides on its own occipital on the left feels hot.   Interval history 07/20/2019: Patient here for a new chief complaint as requested by Dr. Estanislado Pandy for hand pain. PMHx vitamin B12 deficiency, sleep apnea, rheumatoid arthritis, pulmonary fibrosis, polymyositis, osteoarthritis, migraines, joint pain, interstitial lung disease, fibromyalgia, fibromuscular dysplasia, bronchiectasis and abnormal liver function test.  In August or September, she had hand pain. Her hands felt cool, right > left, she started getting cramping in the right hand, not significantly painful just cramped, difficult time straightening it, massage helped. A month later it  happened again, more painful, and she could see it "drawing", started getting colder to the touch, she woke up with excruciating pain in the hand and forearm ventrally and she was cramped again. She took 2 alleve. Severe, some numbness, pain felt like tightness, severe and visibly "drawing" up. Massaging helped finger by finger. MRI  cervical spine was normal.  She has weakness in her hands. Progressively worsening. Pain in both shoulders. Some numbness in the first 4 digits. No prior diagnosis of CTS. No injury. She also felt a big knot on the inside of her wrist. Like a muscle spasm. No other focal neurologic deficits, associated symptoms, inciting events or modifiable factors.   Personally reviewed MRI cervical spine 07/08/2019: Normal  Cbc/cmp normal 03/2019  Interval history 06/08/2019: Carotid arteries on CTA c/w FMD.Discussed Fibromuscular Dysplasia, sent to Dr. Estanislado Pandy and being followed there. She has sharp pain in the back of the head, stops her because it is so severe, so painful it stops her in her tracks. Leaves her weak afterwards. Can go weeks without it. It is random, unknown what triggers, brief, severe. Also continues to have migraines, left sided, pulsating, pounding, contsant pain, behind the eye, feels she may get ptosis, no lacrimation or rhinorrhea. She has light and sound sensitivity. She has daily headaches, no medication overuse, no aura. She has 8 migraine days a month with moderate or severe quality and can last 24-72 hours ongoing for > year at this frequency and severity and quality. For FMD and BO meds and a daily asa. Tried atenolol, relpax, telmisartan, verapamil, nortriptyline, topamax. She has some brain fog, discussed normal aging, chronic pain and lifestyle. She has had neck pain, she has numbness and tingling in the left hand and arm, ongoing for >  Months, failed conservative measures, under the care of a physician.    HPI:  MAHUM BETTEN is a 55 y.o. female here as requested by Dr. Quay Burow for headaches. PMHx RA, bronchiectasis, polymyositis on imuran follows with Rheumatology, never smoked, interstitial lung disease, fibromyalgia, migraines, mitral valve prolapse, abnormal liver function test. She was supposed to have a sleep study but did not complete that (per notes [name]).  She reports  she has headaches that come on during exertion and dissipate without medication when she slows down or alters activities. These are different from her usual migraines. She just had sleep test a few weeks ago, snoring, stops breathing. She does not have the results. She wakes up at night feeling like she can't breathe. She wakes with with "new" headache in the setting of sleep problems for at least 5-6 months. She may have the same headache later in the day. The headache is a fogginedd across  the forehead and more on the left.She wakes with this headache in the setting if snoring and lung problems. The headache is worse with exertion, walking and talking, worse with valsalva and also with sex/orgasm. Rest helps and sitting still and it dissipates slowly. Can be moderately severe. Here with husband who provides much information.  Headache medications used: Atenolol, Flexeril, Relpax,  Reviewed notes, labs and imaging from outside physicians, which showed:  Personally reviewed images from 2013 and agree with the following   Reviewed [name] notes.  Last time she was seen was 07/28/2018.  Patient has a history of rheumatoid arthritis, polymyositis followed in rheumatology.  Also bronchiectasis.  She was supposed to have a sleep study that was previously ordered but she did not.  Epworth Sleepiness  Scale 7 06/16/2018.  ANA negative, rheumatoid factor XX 9, anti-CCP 213, SCL 70, SSA SSB negative, Jo 1+.  Patient's last echocardiogram 05/29/2017 with ejection fraction 60 to 65%, moderate left ventricular hypertrophy, grade 1 DD.  CT of the sinuses were negative for acute disease.   Called Dr. Juanetta Gosling office and requested results of sleep testing   REVIEW OF SYSTEMS: Out of a complete 14 system review of symptoms, the patient complains only of the following symptoms, and all other reviewed systems are negative.  ALLERGIES: Allergies  Allergen Reactions  . Sulfonamide Derivatives      REACTION: hives    HOME MEDICATIONS: Outpatient Medications Prior to Visit  Medication Sig Dispense Refill  . albuterol (PROVENTIL HFA) 108 (90 Base) MCG/ACT inhaler Inhale 2 puffs into the lungs 4 (four) times daily as needed. Reported on 09/03/2015 1 Inhaler 4  . albuterol (PROVENTIL) (2.5 MG/3ML) 0.083% nebulizer solution Take 3 mLs (2.5 mg total) by nebulization every 6 (six) hours as needed for wheezing or shortness of breath. 120 vial 5  . aspirin EC 81 MG tablet Take 81 mg by mouth daily.    [name] atenolol (TENORMIN) 25 MG tablet TAKE 1/2 TABLET BY MOUTH DAILY AS NEEDED FOR MITRAL PROLAPSE 45 tablet 0  . azaTHIOprine (IMURAN) 50 MG tablet TAKE 2 TABLETS(100 MG) BY MOUTH DAILY 60 tablet 2  . BREO ELLIPTA 100-25 MCG/INH AEPB INHALE 1 PUFF INTO THE LUNGS DAILY 60 each 5  . Cholecalciferol (VITAMIN D) 50 MCG (2000 UT) CAPS Take by mouth.    . diclofenac Sodium (VOLTAREN) 1 % GEL Apply 2-4 grams to affected joint 4 times daily as needed. 400 g 2  . eletriptan (RELPAX) 20 MG tablet TAKE 1 TABLET BY MOUTH AS NEEDED FOR MIGRAINE 10 tablet 11  . Etanercept (ENBREL MINI) 50 MG/ML SOCT Inject 50 mg into the skin once a week. 12 mL 0  . folic acid (FOLVITE) 1 MG tablet TAKE 2 TABLETS(2 MG) BY MOUTH DAILY 180 tablet 3  . Fremanezumab-vfrm (AJOVY) 225 MG/1.5ML SOAJ Inject 225 mg into the skin every 30 (thirty) days. 1.5 mL 11  . guaiFENesin (MUCINEX) 600 MG 12 hr tablet Take 2 tablets (1,200 mg total) by mouth 2 (two) times daily as needed for cough or to loosen phlegm. (Patient taking differently: Take 1,200 mg by mouth 2 (two) times daily.)    . metFORMIN (GLUCOPHAGE) 500 MG tablet TAKE 1 TABLET(500 MG) BY MOUTH TWICE DAILY WITH A MEAL 180 tablet 1  . Multiple Vitamins-Minerals (MULTIVITAMIN GUMMIES ADULT PO) Take 2 each by mouth daily.    [name] Respiratory Therapy Supplies (FLUTTER) DEVI Use as directed (Patient taking differently: as needed. Use as directed) 1 each 0  . Rimegepant Sulfate (NURTEC) 75 MG  TBDP Take 75 mg by mouth daily as needed. For migraines. Take as close to onset of migraine as possible. One daily maximum. 10 tablet 6  . telmisartan (MICARDIS) 20 MG tablet TAKE 1 TABLET(20 MG) BY MOUTH DAILY 90 tablet 1   No facility-administered medications prior to visit.    PAST MEDICAL HISTORY: Past Medical History:  Diagnosis Date  . Abnormal liver function test   . Bronchiectasis       . Dyspnea   . Fibromuscular dysplasia ([place])   . Fibromyalgia   . Heart murmur   . ILD (interstitial lung disease) ([place])   . Joint pain   . Menorrhagia   . MIGRAINE HEADACHE   . MVP (mitral valve prolapse)   .  Osteoarthritis   . Polymyositis ([place])    Dr [name]; chronic MTX  . Pulmonary fibrosis ([place])   . Rheumatoid arthritis ([place])   . Sleep apnea   . Vitamin B 12 deficiency   . Vitamin D deficiency     PAST SURGICAL HISTORY: Past Surgical History:  Procedure Laterality Date  . ablation uterine  2010  . CARDIAC CATHETERIZATION  2002   normal  . DILATION AND CURETTAGE OF UTERUS  08-31-08   Dr [name]  . IR ANGIO INTRA EXTRACRAN SEL COM CAROTID INNOMINATE BILAT MOD SED  09/18/2018  . IR ANGIO VERTEBRAL SEL VERTEBRAL BILAT MOD SED  09/18/2018  . IR US GUIDE VASC ACCESS RIGHT  09/18/2018    FAMILY HISTORY: Family History  Problem Relation Age of Onset  . Diabetes Mother   . Fibromyalgia Mother   . Ulcers Mother   . Heart failure Mother   . Thyroid disease Mother   . Obesity Mother   . Multiple myeloma Father   . Hypertension Father   . Stroke Father   . Lupus Sister   . Other Sister        abdominal adhesions resulting in bowel obstruction  . Migraines Sister   . Heart disease Brother        bypass surgery  . Rheum arthritis Sister   . Multiple myeloma Sister   . Hypertension Sister   . Heart attack Sister   . Diabetes Sister   . Hypertension Sister   . Rheum arthritis Sister   . Diabetes Sister   . Diabetes Brother   . Headache Other         siblings with headaches but not diagnosed as migraines    SOCIAL HISTORY: Social History   Socioeconomic History  . Marital status: Married    Spouse name: Analeia Ismael  . Number of children: 1  . Years of education: Not on file  . Highest education level: High school graduate  Occupational History  . Occupation: Compliant and Injury Clerk  Tobacco Use  . Smoking status: Never Smoker  . Smokeless tobacco: Never Used  . Tobacco comment: Married, lives with spouse. 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 Vaping Use: Never used  Substance and Sexual Activity  . Alcohol use: Not Currently    Comment: maybe a wine cooler once every other year  . Drug use: Never  . Sexual activity: Yes    Birth control/protection: Surgical  Other Topics Concern  . Not on file  Social History Narrative   Exercise: trying to walk - limited by fatigue   Lives at home with husband    Right handed   Caffeine: none    Social Determinants of Health   Financial Resource Strain: Not on file  Food Insecurity: Not on file  Transportation Needs: Not on file  Physical Activity: Not on file  Stress: Not on file  Social Connections: Not on file  Intimate Partner Violence: Not on file      PHYSICAL EXAM  Vitals:   11/03/20 0905  BP: 126/75  Pulse: 67  Weight: 280 lb (127 kg)  Height: 5' 8"  (1.727 m)   Body mass index is 42.57 kg/m.  Generalized: Well developed, in no acute distress   Neurological examination  Mentation: Alert oriented to time, place, history taking. Follows all commands speech and language fluent Cranial nerve II-XII: Pupils were equal round reactive to light. Extraocular  movements were full, visual field were full on confrontational test. Facial sensation and strength were normal. Uvula tongue midline. Head turning and shoulder shrug  were normal and symmetric. Motor: The motor testing reveals 5 over 5 strength of all 4 extremities.  Good symmetric motor tone is noted throughout.  Sensory: Sensory testing is intact to soft touch on all 4 extremities. No evidence of extinction is noted.  Coordination: Cerebellar testing reveals good finger-nose-finger and heel-to-shin bilaterally.  Gait and station: Gait is normal.  Reflexes: Deep tendon reflexes are symmetric and normal bilaterally.   DIAGNOSTIC DATA (LABS, IMAGING, TESTING) - I reviewed patient records, labs, notes, testing and imaging myself where available.  Lab Results  Component Value Date   WBC 8.0 09/23/2020   HGB 14.1 09/23/2020   HCT 42.5 09/23/2020   MCV 84.0 09/23/2020   PLT 286 09/23/2020      Component Value Date/Time   NA 140 09/23/2020 1530   NA 141 10/12/2019 1432   K 4.7 09/23/2020 1530   CL 106 09/23/2020 1530   CO2 28 09/23/2020 1530   GLUCOSE 85 09/23/2020 1530   BUN 13 09/23/2020 1530   BUN 11 10/12/2019 1432   CREATININE 0.64 09/23/2020 1530   CALCIUM 10.2 09/23/2020 1530   PROT 8.0 09/23/2020 1530   PROT 7.7 10/12/2019 1432   ALBUMIN 4.6 10/12/2019 1432   AST 17 09/23/2020 1530   ALT 19 09/23/2020 1530   ALKPHOS 76 10/12/2019 1432   BILITOT 0.3 09/23/2020 1530   BILITOT 0.3 10/12/2019 1432   GFRNONAA 101 09/23/2020 1530   GFRAA 117 09/23/2020 1530   Lab Results  Component Value Date   CHOL 154 10/12/2019   HDL 61 10/12/2019   LDLCALC 83 10/12/2019   TRIG 43 10/12/2019   CHOLHDL 2.5 10/12/2019   Lab Results  Component Value Date   HGBA1C 5.5 10/12/2019   Lab Results  Component Value Date   VITAMINB12 1,086 10/12/2019   Lab Results  Component Value Date   TSH 4.040 10/12/2019      ASSESSMENT AND PLAN 55 y.o. year old female  has a past medical history of Abnormal liver function test, Bronchiectasis, Dyspnea, Fibromuscular dysplasia (H[facility]), Fibromyalgia, Heart murmur, ILD (interstitial lung disease) ([place]), Joint pain, Menorrhagia, MIGRAINE HEADACHE, MVP (mitral valve prolapse), Osteoarthritis, Polymyositis ([place]),  Pulmonary fibrosis ([place]), Rheumatoid arthritis ([place]), Sleep apnea, Vitamin B 12 deficiency, and Vitamin D deficiency. here with:  1.  Migraine headache  --Continue Ajovy --Continue Relpax and Nurtec for abortive therapy  2.  Stabbing headache  --Add gabapentin 100 mg at bedtime --Advised patient to make [name] aware at her next office visit.  Ironically these headaches only occur at night.  Curious if oxygen level is maintained on 2 L of oxygen and CPAP therapy?  Follow-up in 3 months or sooner if needed  I spent 30 minutes of face-to-face and non-face-to-face time with patient.  This included previsit chart review, lab review, study review, order entry, electronic health record documentation, patient education.  Ward Givens, MSN, NP-C 11/03/2020, 8:56 AM [facility] [address] [place], [place] [address] [phone]  Made any corrections needed, and agree with history, physical, neuro exam,assessment and plan as stated.     Sarina Ill, MD [facility]

## 2020-11-03 NOTE — Patient Instructions (Signed)
Your Plan:  Continue ajovy Continue Nurtec and relpax for acute migraine Start gabapentin 100 mg at bedtime   Thank you for coming to see [name] at [facility]. I hope we have been able to provide you high quality care today.  You may receive a patient satisfaction survey over the next few weeks. We would appreciate your feedback and comments so that we may continue to improve ourselves and the health of our patients.  Gabapentin capsules or tablets What is this medicine? GABAPENTIN (GA ba pen tin) is used to control seizures in certain types of epilepsy. It is also used to treat certain types of nerve pain. This medicine may be used for other purposes; ask your health care provider or pharmacist if you have questions. COMMON BRAND NAME(S): Active-PAC with Gabapentin, Orpha Bur, Gralise, Neurontin What should I tell my health care provider before I take this medicine? They need to know if you have any of these conditions:  history of drug abuse or alcohol abuse problem  kidney disease  lung or breathing disease  suicidal thoughts, plans, or attempt; a previous suicide attempt by you or a family member  an unusual or allergic reaction to gabapentin, other medicines, foods, dyes, or preservatives  pregnant or trying to get pregnant  breast-feeding How should I use this medicine? Take this medicine by mouth with a glass of water. Follow the directions on the prescription label. You can take it with or without food. If it upsets your stomach, take it with food. Take your medicine at regular intervals. Do not take it more often than directed. Do not stop taking except on your doctor's advice. If you are directed to break the 600 or 800 mg tablets in half as part of your dose, the extra half tablet should be used for the next dose. If you have not used the extra half tablet within 28 days, it should be thrown away. A special MedGuide will be given to you by the pharmacist  with each prescription and refill. Be sure to read this information carefully each time. Talk to your pediatrician regarding the use of this medicine in children. While this drug may be prescribed for children as young as 3 years for selected conditions, precautions do apply. Overdosage: If you think you have taken too much of this medicine contact a poison control center or emergency room at once. NOTE: This medicine is only for you. Do not share this medicine with others. What if I miss a dose? If you miss a dose, take it as soon as you can. If it is almost time for your next dose, take only that dose. Do not take double or extra doses. What may interact with this medicine? This medicine may interact with the following medications:  alcohol  antihistamines for allergy, cough, and cold  certain medicines for anxiety or sleep  certain medicines for depression like amitriptyline, fluoxetine, sertraline  certain medicines for seizures like phenobarbital, primidone  certain medicines for stomach problems  general anesthetics like halothane, isoflurane, methoxyflurane, propofol  local anesthetics like lidocaine, pramoxine, tetracaine  medicines that relax muscles for surgery  narcotic medicines for pain  phenothiazines like chlorpromazine, mesoridazine, prochlorperazine, thioridazine This list may not describe all possible interactions. Give your health care provider a list of all the medicines, herbs, non-prescription drugs, or dietary supplements you use. Also tell them if you smoke, drink alcohol, or use illegal drugs. Some items may interact with your medicine. What should I watch for  while using this medicine? Visit your doctor or health care provider for regular checks on your progress. You may want to keep a record at home of how you feel your condition is responding to treatment. You may want to share this information with your doctor or health care provider at each visit. You  should contact your doctor or health care provider if your seizures get worse or if you have any new types of seizures. Do not stop taking this medicine or any of your seizure medicines unless instructed by your doctor or health care provider. Stopping your medicine suddenly can increase your seizures or their severity. This medicine may cause serious skin reactions. They can happen weeks to months after starting the medicine. Contact your health care provider right away if you notice fevers or flu-like symptoms with a rash. The rash may be red or purple and then turn into blisters or peeling of the skin. Or, you might notice a red rash with swelling of the face, lips or lymph nodes in your neck or under your arms. Wear a medical identification bracelet or chain if you are taking this medicine for seizures, and carry a card that lists all your medications. You may get drowsy, dizzy, or have blurred vision. Do not drive, use machinery, or do anything that needs mental alertness until you know how this medicine affects you. To reduce dizzy or fainting spells, do not sit or stand up quickly, especially if you are an older patient. Alcohol can increase drowsiness and dizziness. Avoid alcoholic drinks. Your mouth may get dry. Chewing sugarless gum or sucking hard candy, and drinking plenty of water will help. The use of this medicine may increase the chance of suicidal thoughts or actions. Pay special attention to how you are responding while on this medicine. Any worsening of mood, or thoughts of suicide or dying should be reported to your health care provider right away. Women who become pregnant while using this medicine may enroll in the [place] Pregnancy Registry by calling [phone]. This registry collects information about the safety of antiepileptic drug use during pregnancy. What side effects may I notice from receiving this medicine? Side effects that you should report to  your doctor or health care professional as soon as possible:  allergic reactions like skin rash, itching or hives, swelling of the face, lips, or tongue  breathing problems  rash, fever, and swollen lymph nodes  redness, blistering, peeling or loosening of the skin, including inside the mouth  suicidal thoughts, mood changes Side effects that usually do not require medical attention (report to your doctor or health care professional if they continue or are bothersome):  dizziness  drowsiness  headache  nausea, vomiting  swelling of ankles, feet, hands  tiredness This list may not describe all possible side effects. Call your doctor for medical advice about side effects. You may report side effects to FDA at 1-800-FDA-1088. Where should I keep my medicine? Keep out of reach of children. This medicine may cause accidental overdose and death if it taken by other adults, children, or pets. Mix any unused medicine with a substance like cat litter or coffee grounds. Then throw the medicine away in a sealed container like a sealed bag or a coffee can with a lid. Do not use the medicine after the expiration date. Store at room temperature between 15 and 30 degrees C (59 and 86 degrees F). NOTE: This sheet is a summary. It may not cover all  possible information. If you have questions about this medicine, talk to your doctor, pharmacist, or health care provider.  2021 Elsevier/Gold Standard (2018-10-24 14:16:43)

## 2020-11-10 ENCOUNTER — Other Ambulatory Visit: Payer: Self-pay | Admitting: Physician Assistant

## 2020-11-10 DIAGNOSIS — M0579 Rheumatoid arthritis with rheumatoid factor of multiple sites without organ or systems involvement: Secondary | ICD-10-CM

## 2020-11-22 ENCOUNTER — Other Ambulatory Visit: Payer: Self-pay

## 2020-11-22 ENCOUNTER — Ambulatory Visit (HOSPITAL_COMMUNITY): Payer: Federal, State, Local not specified - PPO | Attending: Cardiovascular Disease

## 2020-11-22 DIAGNOSIS — I340 Nonrheumatic mitral (valve) insufficiency: Secondary | ICD-10-CM

## 2020-11-22 LAB — ECHOCARDIOGRAM COMPLETE
Area-P 1/2: 3.48 cm2
S' Lateral: 2.7 cm

## 2020-11-23 NOTE — Telephone Encounter (Signed)
Emgality please, thanks

## 2020-11-23 NOTE — Telephone Encounter (Signed)
I called the federal employee program and was told the Ajovy is not covered at all. Aimovig and Emgality are however and their copays are estimated at $55 a month. There is a savings card for the Aimovig if covered. PAs are required for both of those.

## 2020-11-25 ENCOUNTER — Other Ambulatory Visit: Payer: Self-pay | Admitting: Physician Assistant

## 2020-11-25 DIAGNOSIS — M0579 Rheumatoid arthritis with rheumatoid factor of multiple sites without organ or systems involvement: Secondary | ICD-10-CM

## 2020-11-25 NOTE — Telephone Encounter (Signed)
Next Visit: 03/02/2021  Last Visit: 09/29/2020  Last Fill: 09/07/2020  DX: Rheumatoid arthritis involving multiple sites with positive rheumatoid factor   Current Dose per office note 09/29/2020, Enbrel 50 mg sq injections once weekly   Labs: 2/18/202,CBC, CMP and CK are normal.   TB Gold: 02/02/2020, negative  Okay to refill Enbrel Mini?

## 2020-11-26 DIAGNOSIS — G4733 Obstructive sleep apnea (adult) (pediatric): Secondary | ICD-10-CM | POA: Diagnosis not present

## 2020-11-26 DIAGNOSIS — J471 Bronchiectasis with (acute) exacerbation: Secondary | ICD-10-CM | POA: Diagnosis not present

## 2020-12-21 ENCOUNTER — Telehealth: Payer: Self-pay

## 2020-12-21 NOTE — Telephone Encounter (Addendum)
Faxed PA request to [facility] for ENBREL. Will update once we receive a determination.  Fax# [phone] Phone# [phone] PA# [phone]

## 2020-12-26 DIAGNOSIS — G4733 Obstructive sleep apnea (adult) (pediatric): Secondary | ICD-10-CM | POA: Diagnosis not present

## 2020-12-26 DIAGNOSIS — J471 Bronchiectasis with (acute) exacerbation: Secondary | ICD-10-CM | POA: Diagnosis not present

## 2020-12-27 NOTE — Telephone Encounter (Signed)
Received notification from [facility] regarding a prior authorization for ENBREL. Authorization has been APPROVED from 11/23/20 to 06/21/22.   Knox Saliva, PharmD, MPH Clinical Pharmacist (Rheumatology and Pulmonology)

## 2020-12-29 ENCOUNTER — Other Ambulatory Visit: Payer: Self-pay | Admitting: Rheumatology

## 2020-12-29 NOTE — Telephone Encounter (Signed)
Next Visit: 03/02/2021  Last Visit: 09/29/2020  Last Fill: 09/28/2020  DX:  Rheumatoid arthritis involving multiple sites with positive rheumatoid factor   Current Dose per office note 09/29/2020, Imuran 100 mg by mouth daily  Labs: 09/23/2020, CBC, CMP and CK are normal.  LMOM labs are due.  Okay to refill Imuran?

## 2020-12-30 DIAGNOSIS — J471 Bronchiectasis with (acute) exacerbation: Secondary | ICD-10-CM | POA: Diagnosis not present

## 2020-12-30 DIAGNOSIS — G4733 Obstructive sleep apnea (adult) (pediatric): Secondary | ICD-10-CM | POA: Diagnosis not present

## 2020-12-31 ENCOUNTER — Other Ambulatory Visit: Payer: Self-pay | Admitting: Pulmonary Disease

## 2021-01-06 ENCOUNTER — Other Ambulatory Visit: Payer: Self-pay

## 2021-01-06 DIAGNOSIS — Z111 Encounter for screening for respiratory tuberculosis: Secondary | ICD-10-CM

## 2021-01-06 DIAGNOSIS — M3320 Polymyositis, organ involvement unspecified: Secondary | ICD-10-CM

## 2021-01-06 DIAGNOSIS — Z79899 Other long term (current) drug therapy: Secondary | ICD-10-CM

## 2021-01-18 DIAGNOSIS — Z111 Encounter for screening for respiratory tuberculosis: Secondary | ICD-10-CM | POA: Diagnosis not present

## 2021-01-18 DIAGNOSIS — Z79899 Other long term (current) drug therapy: Secondary | ICD-10-CM | POA: Diagnosis not present

## 2021-01-18 DIAGNOSIS — M3320 Polymyositis, organ involvement unspecified: Secondary | ICD-10-CM | POA: Diagnosis not present

## 2021-01-19 NOTE — Progress Notes (Signed)
CBC, CMP and CK are normal.

## 2021-01-20 ENCOUNTER — Other Ambulatory Visit (HOSPITAL_COMMUNITY): Payer: Self-pay

## 2021-01-21 LAB — CBC WITH DIFFERENTIAL/PLATELET
Absolute Monocytes: 752 cells/uL (ref 200–950)
Basophils Absolute: 40 cells/uL (ref 0–200)
Basophils Relative: 0.5 %
Eosinophils Absolute: 144 cells/uL (ref 15–500)
Eosinophils Relative: 1.8 %
HCT: 41 % (ref 35.0–45.0)
Hemoglobin: 13.9 g/dL (ref 11.7–15.5)
Lymphs Abs: 1592 cells/uL (ref 850–3900)
MCH: 28.8 pg (ref 27.0–33.0)
MCHC: 33.9 g/dL (ref 32.0–36.0)
MCV: 84.9 fL (ref 80.0–100.0)
MPV: 10.5 fL (ref 7.5–12.5)
Monocytes Relative: 9.4 %
Neutro Abs: 5472 cells/uL (ref 1500–7800)
Neutrophils Relative %: 68.4 %
Platelets: 294 10*3/uL (ref 140–400)
RBC: 4.83 10*6/uL (ref 3.80–5.10)
RDW: 12.3 % (ref 11.0–15.0)
Total Lymphocyte: 19.9 %
WBC: 8 10*3/uL (ref 3.8–10.8)

## 2021-01-21 LAB — COMPLETE METABOLIC PANEL WITH GFR
AG Ratio: 1.3 (calc) (ref 1.0–2.5)
ALT: 10 U/L (ref 6–29)
AST: 15 U/L (ref 10–35)
Albumin: 4.6 g/dL (ref 3.6–5.1)
Alkaline phosphatase (APISO): 63 U/L (ref 37–153)
BUN: 14 mg/dL (ref 7–25)
CO2: 27 mmol/L (ref 20–32)
Calcium: 9.9 mg/dL (ref 8.6–10.4)
Chloride: 106 mmol/L (ref 98–110)
Creat: 0.69 mg/dL (ref 0.50–1.05)
GFR, Est African American: 114 mL/min/{1.73_m2} (ref >=60)
GFR, Est Non African American: 98 mL/min/{1.73_m2} (ref >=60)
Globulin: 3.6 g/dL (calc) (ref 1.9–3.7)
Glucose, Bld: 95 mg/dL (ref 65–139)
Potassium: 4.1 mmol/L (ref 3.5–5.3)
Sodium: 141 mmol/L (ref 135–146)
Total Bilirubin: 0.5 mg/dL (ref 0.2–1.2)
Total Protein: 8.2 g/dL — ABNORMAL HIGH (ref 6.1–8.1)

## 2021-01-21 LAB — QUANTIFERON-TB GOLD PLUS
Mitogen-NIL: 5.37 IU/mL
NIL: 0.04 IU/mL
QuantiFERON-TB Gold Plus: NEGATIVE
TB1-NIL: 0.01 IU/mL
TB2-NIL: 0 IU/mL

## 2021-01-21 LAB — CK: Total CK: 57 U/L (ref 29–143)

## 2021-01-23 NOTE — Progress Notes (Signed)
TB Gold is negative.

## 2021-01-26 DIAGNOSIS — J471 Bronchiectasis with (acute) exacerbation: Secondary | ICD-10-CM | POA: Diagnosis not present

## 2021-01-26 DIAGNOSIS — G4733 Obstructive sleep apnea (adult) (pediatric): Secondary | ICD-10-CM | POA: Diagnosis not present

## 2021-02-01 ENCOUNTER — Ambulatory Visit: Payer: Federal, State, Local not specified - PPO | Admitting: Pulmonary Disease

## 2021-02-01 ENCOUNTER — Encounter: Payer: Self-pay | Admitting: Pulmonary Disease

## 2021-02-01 ENCOUNTER — Other Ambulatory Visit: Payer: Self-pay

## 2021-02-01 VITALS — BP 130/90 | HR 81 | Temp 98.0°F | Ht 68.0 in | Wt 280.6 lb

## 2021-02-01 DIAGNOSIS — J479 Bronchiectasis, uncomplicated: Secondary | ICD-10-CM

## 2021-02-01 DIAGNOSIS — J849 Interstitial pulmonary disease, unspecified: Secondary | ICD-10-CM

## 2021-02-01 DIAGNOSIS — G4733 Obstructive sleep apnea (adult) (pediatric): Secondary | ICD-10-CM

## 2021-02-01 NOTE — Patient Instructions (Signed)
Follow up in 6 months 

## 2021-02-01 NOTE — Progress Notes (Signed)
Pulmonary, Critical Care, and Sleep Medicine  Chief Complaint  Patient presents with   Follow-up    Oxygen up to 2.5 L, had some rough days coughing and had to use oxygen and did not feel like she was getting any thing.  Oxygen level dropped to 88-89% on 2L.  Has had one day of yellow/Carder mucous.    Constitutional:  BP 130/90 (BP Location: Right Arm, Patient Position: Sitting, Cuff Size: Large)   Pulse 81   Temp 98 F (36.7 C) (Oral)   Ht 5' 8"  (1.727 m)   Wt 280 lb 9.6 oz (127.3 kg)   SpO2 98%   BMI 42.67 kg/m   Past Medical History:  Fibromyalgia, Migraine HA, MVP, OA, Fibromuscular dysplasia, Erythema nodosum, Vit D deficiency  Past Surgical History:  Her  has a past surgical history that includes Cardiac catheterization (2002); Dilation and curettage of uterus (08-31-08); ablation uterine (2010); IR ANGIO VERTEBRAL SEL VERTEBRAL BILAT MOD SED (09/18/2018); IR ANGIO INTRA EXTRACRAN SEL COM CAROTID INNOMINATE BILAT MOD SED (09/18/2018); and IR US Guide Vasc Access Right (09/18/2018).  Brief Summary:  Jill Harper is a 55 y.o. female with bronchiectasis in the setting of seropositive rheumatoid arthritis and polymyositis.      Subjective:   She had trouble with a cough and borderline oxygen level several days ago.  Didn't improved with albuterol.  Increased O2 to 2.5 liters and this helped.  Feeling better now.  Has typical cough with sputum in the morning, but okay during the rest of the day.  No fever or hemoptysis.  Mucinex helps.  Limits flutter valve use - causes headache.  No issues with CPAP.  Asked about getting POC to use during the day when she gets headache and has to wear mask.  Hasn't gotten booster COVID shot yet.  Physical Exam:   Appearance - well kempt   ENMT - no sinus tenderness, no oral exudate, no LAN, Mallampati 4 airway, no stridor  Respiratory - equal breath sounds bilaterally, no wheezing or rales  CV - s1s2 regular rate and rhythm, no  murmurs  Ext - no clubbing, no edema  Skin - no rashes  Psych - normal mood and affect   Pulmonary testing:  PFT 10/30/11 >> FEV1 3.31 (114%), FEV1% 85, TLC 6.06 (108%), DLCO 77%, no BD PFT 11/18/12 >> FEV1 3.18 (111%), FEV1% 84, TLC 5.34 (95%), DLCO 67%, no BD PFT 12/02/15 >> FEV1 2.96 (108%), FEV1% 92, TLC 4.95 (86%), DLCO 65%, no BD PFT 04/17/18 >> FEV1 2.46 (101%), FEV1% 90, DLCO 76% PFT 02/05/19 >> FEV1 2.30 (87%), FEV1% 89, TLC 4.21 (74%), DLCO 81%, no BD PFT 02/17/20 >> FEV1 2.18 (83%), FEV1% 90, TLC 4.22 (74%), DLCO 78%  Chest Imaging:  CT chest 03/23/05 >> patchy b/l lower lung ASD with cylindrical BTX CT chest 02/17/08 >> peripheral and basilar predominant subpleural GGO CT chest 12/01/08 >> no change HRCT chest 09/07/15 >> scattered GGO HRCT chest 04/15/18 >> patchy confluent subpleural and peripheral peribronchovascular reticulation and ground-glass opacity throughout both lungs with a strong basilar gradient, traction BTX HRCT chest 03/02/20 >> widespread patchy GGO, septal thickening, mild cylindrical BTX, craniocaudal gradient with mild progression, mild air trapping  Labs:  Quantiferon gold 01/31/17 >> negative Serology 01/31/17 >> RF 29, CCP 213, Jo-1 > 8; ANA, SCL 70, SSA/SSB, ENA Sm negative Quantiferon gold 01/31/17 >> negative Ig 01/31/17 >> IgG 1992, IgA 147, IgM 136  Sleep Tests:  HST 07/28/18 >> AHI 5,  SpO2 low 83% ONO with CPAP 11/05/18 >> test time 6 hrs 24 min, baseline SpO2 90%, low SpO2 80%; spent 1 hr 15 min with SpO2 < 88% Auto CPAP 04/25/20 to 05/24/20 >> used on 30 of 30 nights with average 8 hrs 35 min.  Average AHI 1 with median CPAP 7 and 95 th percentile CPAP 10 cm H2O  Cardiac Tests:  Echo 05/23/18 >> EF 60 to 65%, grade 1 DD, mild MR  Social History:  She  reports that she has never smoked. She has never used smokeless tobacco. She reports previous alcohol use. She reports that she does not use drugs.  Family History:  Her family history includes  Diabetes in her brother, mother, sister, and sister; Fibromyalgia in her mother; Headache in an other family member; Heart attack in her sister; Heart disease in her brother; Heart failure in her mother; Hypertension in her father, sister, and sister; Lupus in her sister; Migraines in her sister; Multiple myeloma in her father and sister; Obesity in her mother; Other in her sister; Rheum arthritis in her sister and sister; Stroke in her father; Thyroid disease in her mother; Ulcers in her mother.     Assessment/Plan:   ILD with UIP pattern and Bronchiectasis in setting of RA and polymyositis. - continue breo and prn albuterol - prn mucinex - limit use of flutter valve >> makes headache worse - discussed symptoms/signs to monitor for that would suggest an exacerbation  Rheumatoid arthritis with positive RF and CCP, polymyositis. - followed by Dr. Bo Merino with rheumatology - maintained on imuran and enbrel   Obstructive sleep apnea. - she is compliant with CPAP and reports benefit from therapy - she uses [employer] for her DME - continue auto CPAP 7 to 15 cm H2O   Nocturnal hypoxemia. - 2.5 liters oxygen at night with CPAP and prn during the day with dyspnea and headache - she will call for script if she finds a POC she would like to purchase; explained this might not be covered by insurance  COVID 19 advice. - I have advised her to schedule COVID vaccine booster  Migraine headache. - followed by Dr. Sarina Ill with [facility]  Time Spent Involved in Patient Care on Day of Examination:  26 minutes  Follow up:   Patient Instructions  Follow up in 6 months  Medication List:   Allergies as of 02/01/2021       Reactions   Sulfonamide Derivatives    REACTION: hives        Medication List        Accurate as of February 01, 2021  5:05 PM. If you have any questions, ask your nurse or doctor.          Ajovy 225 MG/1.5ML Soaj Generic drug:  Fremanezumab-vfrm Inject 225 mg into the skin every 30 (thirty) days.   albuterol 108 (90 Base) MCG/ACT inhaler Commonly known as: Proventil HFA Inhale 2 puffs into the lungs 4 (four) times daily as needed. Reported on 09/03/2015   albuterol (2.5 MG/3ML) 0.083% nebulizer solution Commonly known as: PROVENTIL Take 3 mLs (2.5 mg total) by nebulization every 6 (six) hours as needed for wheezing or shortness of breath.   aspirin EC 81 MG tablet Take 81 mg by mouth daily.   atenolol 25 MG tablet Commonly known as: TENORMIN TAKE 1/2 TABLET BY MOUTH DAILY AS NEEDED FOR MITRAL PROLAPSE   azaTHIOprine 50 MG tablet Commonly known as: IMURAN TAKE 2 TABLETS(100 MG) BY  MOUTH DAILY   Breo Ellipta 100-25 MCG/INH Aepb Generic drug: fluticasone furoate-vilanterol INHALE 1 PUFF INTO THE LUNGS DAILY   diclofenac Sodium 1 % Gel Commonly known as: VOLTAREN Apply 2-4 grams to affected joint 4 times daily as needed.   eletriptan 20 MG tablet Commonly known as: RELPAX TAKE 1 TABLET BY MOUTH AS NEEDED FOR MIGRAINE   Enbrel Mini 50 MG/ML Soct Generic drug: Etanercept INSERT MINI CARTRIDGE INTO AUTOINJECTOR AND INJECT UNDER THE SKIN EVERY 7 DAYS.   Flutter Devi Use as directed What changed:  when to take this reasons to take this   folic acid 1 MG tablet Commonly known as: FOLVITE TAKE 2 TABLETS(2 MG) BY MOUTH DAILY   gabapentin 100 MG capsule Commonly known as: NEURONTIN Take 1 capsule (100 mg total) by mouth at bedtime.   guaiFENesin 600 MG 12 hr tablet Commonly known as: Mucinex Take 2 tablets (1,200 mg total) by mouth 2 (two) times daily as needed for cough or to loosen phlegm. What changed: when to take this   metFORMIN 500 MG tablet Commonly known as: GLUCOPHAGE TAKE 1 TABLET(500 MG) BY MOUTH TWICE DAILY WITH A MEAL   MULTIVITAMIN GUMMIES ADULT PO Take 2 each by mouth daily.   Nurtec 75 MG Tbdp Generic drug: Rimegepant Sulfate Take 75 mg by mouth daily as needed. For  migraines. Take as close to onset of migraine as possible. One daily maximum.   telmisartan 20 MG tablet Commonly known as: MICARDIS TAKE 1 TABLET(20 MG) BY MOUTH DAILY   Vitamin D 50 MCG (2000 UT) Caps Take by mouth.        Signature:  Chesley Mires, MD [place] Pager - [phone] 02/01/2021, 5:05 PM

## 2021-02-07 ENCOUNTER — Encounter: Payer: Self-pay | Admitting: Adult Health

## 2021-02-07 ENCOUNTER — Ambulatory Visit: Payer: Federal, State, Local not specified - PPO | Admitting: Adult Health

## 2021-02-07 VITALS — BP 139/74 | HR 64 | Ht 68.0 in | Wt 279.0 lb

## 2021-02-07 DIAGNOSIS — G4485 Primary stabbing headache: Secondary | ICD-10-CM

## 2021-02-07 DIAGNOSIS — G43709 Chronic migraine without aura, not intractable, without status migrainosus: Secondary | ICD-10-CM | POA: Diagnosis not present

## 2021-02-07 MED ORDER — GABAPENTIN 100 MG PO CAPS: 200.0000 mg | ORAL_CAPSULE | Freq: Every day | ORAL | 5 refills | Status: DC

## 2021-02-07 MED ORDER — EMGALITY 120 MG/ML ~~LOC~~ SOAJ: 120.0000 mg | SUBCUTANEOUS | 5 refills | Status: DC

## 2021-02-07 NOTE — Progress Notes (Addendum)
PATIENT: Jill Harper DOB: 1966/04/05  REASON FOR VISIT: follow up HISTORY FROM: patient  HISTORY OF PRESENT ILLNESS: Today 02/07/21:  Jill Harper is a 55 year old female with a history of migraine headaches.  She returns today for follow-up.  She reports that she has approximately 2 migraines a week.  She typically can take Nurtec or Relpax and the headache resolves fairly quickly.  She uses Ajovy for preventative therapy however this is not covered through her insurance she has been using a co-pay savings card.  She would like to switch to [employer] which is covered through her insurance.  She states that taking gabapentin at bedtime has reduced the severity and frequency of the stabbing headaches that she was receiving.  She does report that she often gets them during the day as well.  At least 2 times a week.  She states that it only lasts for several minutes and afterwards she may feel a little foggy in her head will be tender but that resolves and she is able to carry on with her day.  She does use oxygen at night.  She returns today for an evaluation.  11/03/20: Jill Harper is a 55 year old female with a history of migraine headaches.  She is currently taking Ajovy every 3 weeks.  She has Relpax and Nurtec that she rotates nightly.  Reports that she has sharp shooting pains in the left parietal region that radiates to the temporal region.  This only occurs at night usually wakes her up from sleep.  She has had this type of discomfort before however it has become more frequent recently.  She states that it only lasts for several seconds to minutes before it resolves.  Patient states that she began taking Nurtec every other night and reports that when she took Nurtec she did not wake up with this discomfort.  She states that she did not added Relpax and will take it on the nights that she did not take Nurtec and this also eliminated the discomfort at night. the patient wears a CPAP machine with  supplemental oxygen at night managed by [name].  She states during the day she may have 3 to 4 days a week that she has a mild headache.  She states that the severity has definitely improved.  She typically does not take anything for these headaches.  She reports that sometimes the headaches can last all day long.  She does feel that Ajovy has improved the severity of her headaches.    HISTORY ,(copied from Dr. Cathren Laine note)  When she has a headache/migraine the relpax works. She may have to take 2 doses. Sometimes tylenol will work alone. She has weired feeling and stabbing pain sometimes and lasts a few minutes in the left parietal area. She feels the Ajovy is not as effective, wears out, we discussed changing to Emgality or possibly taking Ajovy q3weeks, gave her sampes, she will try Ajovt q3weeks. Try Nurtec.      Interval history September 23, 2019: Patient here for follow-up, she was last seen in December for hand pain and EMG nerve conduction study did show carpal tunnel syndrome.  PMHx vitamin B12 deficiency, sleep apnea, rheumatoid arthritis, pulmonary fibrosis, polymyositis, osteoarthritis, migraines, joint pain, interstitial lung disease, fibromyalgia, fibromuscular dysplasia, bronchiectasis and abnormal liver function test. She saw Dr. Donzetta Matters and vein in vascular for discoloration of her skin, she gets blue tips of fingers and nails, diagnosed with raynaud's phenomenon related to her RA, no  vascular intervention, reviewed his notes and discussed with patient as above. She saw Dr. Mervin Hack for the CTS and they are going to monitor, the symptoms have subsided and her symptoms were slight atypical for CTS on the right, she had ulnar neuropathy on the left and she noticed she did have symptoms and she was given prednisone which helped but it still some and goes, Ajovy working for migraines not daily headaches, she can have an occassional headache and it generally subsides on its own occipital on the left  feels hot.    Interval history 07/20/2019: Patient here for a new chief complaint as requested by Dr. Estanislado Pandy for hand pain. PMHx vitamin B12 deficiency, sleep apnea, rheumatoid arthritis, pulmonary fibrosis, polymyositis, osteoarthritis, migraines, joint pain, interstitial lung disease, fibromyalgia, fibromuscular dysplasia, bronchiectasis and abnormal liver function test.  In August or September, she had hand pain. Her hands felt cool, right > left, she started getting cramping in the right hand, not significantly painful just cramped, difficult time straightening it, massage helped. A month later it happened again, more painful, and she could see it "drawing", started getting colder to the touch, she woke up with excruciating pain in the hand and forearm ventrally and she was cramped again. She took 2 alleve. Severe, some numbness, pain felt like tightness, severe and visibly "drawing" up. Massaging helped finger by finger. MRI cervical spine was normal.  She has weakness in her hands. Progressively worsening. Pain in both shoulders. Some numbness in the first 4 digits. No prior diagnosis of CTS. No injury. She also felt a big knot on the inside of her wrist. Like a muscle spasm. No other focal neurologic deficits, associated symptoms, inciting events or modifiable factors.     Personally reviewed MRI cervical spine 07/08/2019: Normal   Cbc/cmp normal 03/2019   Interval history 06/08/2019: Carotid arteries on CTA c/w FMD. Discussed Fibromuscular Dysplasia, sent to Dr. Estanislado Pandy and being followed there. She has sharp pain in the back of the head, stops her because it is so severe, so painful it stops her in her tracks. Leaves her weak afterwards. Can go weeks without it. It is random, unknown what triggers, brief, severe. Also continues to have migraines, left sided, pulsating, pounding, contsant pain, behind the eye, feels she may get ptosis, no lacrimation or rhinorrhea. She has light and sound  sensitivity. She has daily headaches, no medication overuse, no aura. She has 8 migraine days a month with moderate or severe quality and can last 24-72 hours ongoing for > year at this frequency and severity and quality. For FMD and BO meds and a daily asa. Tried atenolol, relpax, telmisartan, verapamil, nortriptyline, topamax. She has some brain fog, discussed normal aging, chronic pain and lifestyle. She has had neck pain, she has numbness and tingling in the left hand and arm, ongoing for >  Months, failed conservative measures, under the care of a physician.     HPI:  Jill Harper is a 55 y.o. female here as requested by Dr. Quay Burow for headaches. PMHx RA, bronchiectasis, polymyositis on imuran follows with Rheumatology, never smoked, interstitial lung disease, fibromyalgia, migraines, mitral valve prolapse, abnormal liver function test. She was supposed to have a sleep study but did not complete that (per notes [name]).  She reports she has headaches that come on during exertion and dissipate without medication when she slows down or alters activities. These are different from her usual migraines. She just had sleep test a few weeks ago, snoring,  stops breathing. She does not have the results. She wakes up at night feeling like she can't breathe. She wakes with with "new" headache in the setting of sleep problems for at least 5-6 months. She may have the same headache later in the day. The headache is a fogginedd across  the forehead and more on the left.She wakes with this headache in the setting if snoring and lung problems. The headache is worse with exertion, walking and talking, worse with valsalva and also with sex/orgasm. Rest helps and sitting still and it dissipates slowly. Can be moderately severe. Here with husband who provides much information.   Headache medications used: Atenolol, Flexeril, Relpax,   Reviewed notes, labs and imaging from outside physicians, which showed:   Personally  reviewed images from 2013 and agree with the following    Reviewed [name] notes.  Last time she was seen was 07/28/2018.  Patient has a history of rheumatoid arthritis, polymyositis followed in rheumatology.  Also bronchiectasis.  She was supposed to have a sleep study that was previously ordered but she did not.  Epworth Sleepiness Scale 7 06/16/2018.   ANA negative, rheumatoid factor XX 9, anti-CCP 213, SCL 70, SSA SSB negative, Jo 1+.  Patient's last echocardiogram 05/29/2017 with ejection fraction 60 to 65%, moderate left ventricular hypertrophy, grade 1 DD.  CT of the sinuses were negative for acute disease.    Called Dr. Juanetta Gosling office and requested results of sleep testing    REVIEW OF SYSTEMS: Out of a complete 14 system review of symptoms, the patient complains only of the following symptoms, and all other reviewed systems are negative.  ALLERGIES: Allergies  Allergen Reactions   Sulfonamide Derivatives     REACTION: hives    HOME MEDICATIONS: Outpatient Medications Prior to Visit  Medication Sig Dispense Refill   albuterol (PROVENTIL HFA) 108 (90 Base) MCG/ACT inhaler Inhale 2 puffs into the lungs 4 (four) times daily as needed. Reported on 09/03/2015 1 Inhaler 4   albuterol (PROVENTIL) (2.5 MG/3ML) 0.083% nebulizer solution Take 3 mLs (2.5 mg total) by nebulization every 6 (six) hours as needed for wheezing or shortness of breath. 120 vial 5   aspirin EC 81 MG tablet Take 81 mg by mouth daily.     atenolol (TENORMIN) 25 MG tablet TAKE 1/2 TABLET BY MOUTH DAILY AS NEEDED FOR MITRAL PROLAPSE 45 tablet 0   azaTHIOprine (IMURAN) 50 MG tablet TAKE 2 TABLETS(100 MG) BY MOUTH DAILY 60 tablet 2   BREO ELLIPTA 100-25 MCG/INH AEPB INHALE 1 PUFF INTO THE LUNGS DAILY 60 each 5   Cholecalciferol (VITAMIN D) 50 MCG (2000 UT) CAPS Take by mouth.     diclofenac Sodium (VOLTAREN) 1 % GEL Apply 2-4 grams to affected joint 4 times daily as needed. 400 g 2   eletriptan (RELPAX) 20 MG tablet  TAKE 1 TABLET BY MOUTH AS NEEDED FOR MIGRAINE 10 tablet 11   ENBREL MINI 50 MG/ML SOCT INSERT MINI CARTRIDGE INTO AUTOINJECTOR AND INJECT UNDER THE SKIN EVERY 7 DAYS. 12 mL 0   folic acid (FOLVITE) 1 MG tablet TAKE 2 TABLETS(2 MG) BY MOUTH DAILY 180 tablet 3   Fremanezumab-vfrm (AJOVY) 225 MG/1.5ML SOAJ Inject 225 mg into the skin every 30 (thirty) days. 1.5 mL 11   gabapentin (NEURONTIN) 100 MG capsule Take 1 capsule (100 mg total) by mouth at bedtime. 30 capsule 5   guaiFENesin (MUCINEX) 600 MG 12 hr tablet Take 2 tablets (1,200 mg total) by mouth 2 (two) times  daily as needed for cough or to loosen phlegm. (Patient taking differently: Take 1,200 mg by mouth 2 (two) times daily.)     metFORMIN (GLUCOPHAGE) 500 MG tablet TAKE 1 TABLET(500 MG) BY MOUTH TWICE DAILY WITH A MEAL 180 tablet 1   Multiple Vitamins-Minerals (MULTIVITAMIN GUMMIES ADULT PO) Take 2 each by mouth daily.     Respiratory Therapy Supplies (FLUTTER) DEVI Use as directed (Patient taking differently: as needed. Use as directed) 1 each 0   Rimegepant Sulfate (NURTEC) 75 MG TBDP Take 75 mg by mouth daily as needed. For migraines. Take as close to onset of migraine as possible. One daily maximum. 10 tablet 6   telmisartan (MICARDIS) 20 MG tablet TAKE 1 TABLET(20 MG) BY MOUTH DAILY 90 tablet 1   No facility-administered medications prior to visit.    PAST MEDICAL HISTORY: Past Medical History:  Diagnosis Date   Abnormal liver function test    Bronchiectasis        Dyspnea    Fibromuscular dysplasia (HCC)    Fibromyalgia    Heart murmur    ILD (interstitial lung disease) ([place])    Joint pain    Menorrhagia    MIGRAINE HEADACHE    MVP (mitral valve prolapse)    Osteoarthritis    Polymyositis (HCC)    Dr [name]; chronic MTX   Pulmonary fibrosis (HCC)    Rheumatoid arthritis ([place])    Sleep apnea    Vitamin B 12 deficiency    Vitamin D deficiency     PAST SURGICAL HISTORY: Past Surgical History:  Procedure  Laterality Date   ablation uterine  2010   CARDIAC CATHETERIZATION  2002   normal   DILATION AND CURETTAGE OF UTERUS  08-31-08   Dr [name]   IR [place] SEL COM CAROTID INNOMINATE BILAT MOD SED  09/18/2018   IR ANGIO VERTEBRAL SEL VERTEBRAL BILAT MOD SED  09/18/2018   IR US GUIDE VASC ACCESS RIGHT  09/18/2018    FAMILY HISTORY: Family History  Problem Relation Age of Onset   Diabetes Mother    Fibromyalgia Mother    Ulcers Mother    Heart failure Mother    Thyroid disease Mother    Obesity Mother    Multiple myeloma Father    Hypertension Father    Stroke Father    Lupus Sister    Other Sister        abdominal adhesions resulting in bowel obstruction   Migraines Sister    Heart disease Brother        bypass surgery   Rheum arthritis Sister    Multiple myeloma Sister    Hypertension Sister    Heart attack Sister    Diabetes Sister    Hypertension Sister    Rheum arthritis Sister    Diabetes Sister    Diabetes Brother    Headache Other        siblings with headaches but not diagnosed as migraines    SOCIAL HISTORY: Social History   Socioeconomic History   Marital status: Married    Spouse name: Larhonda Dettloff   Number of children: 1   Years of education: Not on file   Highest education level: High school graduate  Occupational History   Occupation: Compliant and Injury Clerk  Tobacco Use   Smoking status: Never   Smokeless tobacco: Never   Tobacco comments:    Married, lives with spouse. 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  Vaping Use   Vaping Use: Never used  Substance and Sexual Activity   Alcohol use: Not Currently    Comment: maybe a wine cooler once every other year   Drug use: Never   Sexual activity: Yes    Birth control/protection: Surgical  Other Topics Concern   Not on file  Social History Narrative   Exercise: trying to walk - limited by fatigue   Lives at home with husband    Right handed    Caffeine: none    Social Determinants of Health   Financial Resource Strain: Not on file  Food Insecurity: Not on file  Transportation Needs: Not on file  Physical Activity: Not on file  Stress: Not on file  Social Connections: Not on file  Intimate Partner Violence: Not on file      PHYSICAL EXAM  Vitals:   02/07/21 1459  BP: 139/74  Pulse: 64  Weight: 279 lb (126.6 kg)  Height: _0  (1.727 m)    Body mass index is 42.42 kg/m.  Generalized: Well developed, in no acute distress   Neurological examination  Mentation: Alert oriented to time, place, history taking. Follows all commands speech and language fluent Cranial nerve II-XII: Pupils were equal round reactive to light. Extraocular movements were full, visual field were full on confrontational test. Facial sensation and strength were normal. Uvula tongue midline. Head turning and shoulder shrug  were normal and symmetric. Motor: The motor testing reveals 5 over 5 strength of all 4 extremities. Good symmetric motor tone is noted throughout.  Sensory: Sensory testing is intact to soft touch on all 4 extremities. No evidence of extinction is noted.  Coordination: Cerebellar testing reveals good finger-nose-finger and heel-to-shin bilaterally.  Gait and station: Gait is normal.  Reflexes: Deep tendon reflexes are symmetric and normal bilaterally.   DIAGNOSTIC DATA (LABS, IMAGING, TESTING) - I reviewed patient records, labs, notes, testing and imaging myself where available.  Lab Results  Component Value Date   WBC 8.0 01/18/2021   HGB 13.9 01/18/2021   HCT 41.0 01/18/2021   MCV 84.9 01/18/2021   PLT 294 01/18/2021      Component Value Date/Time   NA 141 01/18/2021 1626   NA 141 10/12/2019 1432   K 4.1 01/18/2021 1626   CL 106 01/18/2021 1626   CO2 27 01/18/2021 1626   GLUCOSE 95 01/18/2021 1626   BUN 14 01/18/2021 1626   BUN 11 10/12/2019 1432   CREATININE 0.69 01/18/2021 1626   CALCIUM 9.9 01/18/2021  1626   PROT 8.2 (H) 01/18/2021 1626   PROT 7.7 10/12/2019 1432   ALBUMIN 4.6 10/12/2019 1432   AST 15 01/18/2021 1626   ALT 10 01/18/2021 1626   ALKPHOS 76 10/12/2019 1432   BILITOT 0.5 01/18/2021 1626   BILITOT 0.3 10/12/2019 1432   GFRNONAA 98 01/18/2021 1626   GFRAA 114 01/18/2021 1626   Lab Results  Component Value Date   CHOL 154 10/12/2019   HDL 61 10/12/2019   LDLCALC 83 10/12/2019   TRIG 43 10/12/2019   CHOLHDL 2.5 10/12/2019   Lab Results  Component Value Date   HGBA1C 5.5 10/12/2019   Lab Results  Component Value Date   VITAMINB12 1,086 10/12/2019   Lab Results  Component Value Date   TSH 4.040 10/12/2019      ASSESSMENT AND PLAN 55 y.o. year old female  has a past medical history of Abnormal liver function test, Bronchiectasis, Dyspnea, Fibromuscular dysplasia (HCC), Fibromyalgia, Heart murmur,  ILD (interstitial lung disease) ([place]), Joint pain, Menorrhagia, MIGRAINE HEADACHE, MVP (mitral valve prolapse), Osteoarthritis, Polymyositis ([place]), Pulmonary fibrosis ([place]), Rheumatoid arthritis ([place]), Sleep apnea, Vitamin B 12 deficiency, and Vitamin D deficiency. here with:  1.  Migraine headache  -- Stop Ajovy -- Start Emgality 120 mg monthly injection --Continue Relpax and Nurtec for abortive therapy  2.  Stabbing headache  -- Increase gabapentin to 200 mg at bedtime.  Advised that she could split her dose taking 100 mg in the morning and at bedtime if she desires.   Follow-up in 6 months or sooner if needed   Ward Givens, MSN, NP-C 02/07/2021, 2:56 PM [facility] [address], [place], [place] [address] [phone]  Made any corrections needed, and agree with history, physical, neuro exam,assessment and plan as stated.     Sarina Ill, MD [facility]  agree with assessment and plan as stated.     Sarina Ill, MD [facility]

## 2021-02-07 NOTE — Patient Instructions (Addendum)
Your Plan:  Stop Ajovy- switch to emgality 120 mg monthly injection Continue Relpax or nurtec for abortive therapy Increase gabapentin 200 mg at bedtime      Thank you for coming to see [name] at [facility]. I hope we have been able to provide you high quality care today.  You may receive a patient satisfaction survey over the next few weeks. We would appreciate your feedback and comments so that we may continue to improve ourselves and the health of our patients.

## 2021-02-12 ENCOUNTER — Other Ambulatory Visit: Payer: Self-pay | Admitting: Rheumatology

## 2021-02-16 NOTE — Progress Notes (Signed)
Office Visit Note  Patient: Jill Harper             Date of Birth: October 26, 1965           MRN: [id number]             PCP: Binnie Rail, MD Referring: Binnie Rail, MD Visit Date: 03/02/2021 Occupation: @GUAROCC @  Subjective:  Medication management.   History of Present Illness: Jill Harper is a 55 y.o. female with history of polymyositis, ILD and rheumatoid arthritis.  She is doing well on current combination.  She denies any increased muscular weakness or tenderness.  She denies any shortness of breath.  There is no history of increased joint pain or joint swelling.    Activities of Daily Living:  Patient reports morning stiffness for 0 minutes.   Patient Denies nocturnal pain.  Difficulty dressing/grooming: Denies Difficulty climbing stairs: Denies Difficulty getting out of chair: Denies Difficulty using hands for taps, buttons, cutlery, and/or writing: Reports  Review of Systems  Constitutional:  Positive for fatigue.  HENT:  Negative for mouth sores, mouth dryness and nose dryness.   Eyes:  Negative for pain, itching and dryness.  Respiratory:  Negative for shortness of breath and difficulty breathing.   Cardiovascular:  Negative for chest pain and palpitations.  Gastrointestinal:  Negative for blood in stool, constipation and diarrhea.  Endocrine: Negative for increased urination.  Genitourinary:  Negative for difficulty urinating.  Musculoskeletal:  Negative for joint pain, joint pain, joint swelling, myalgias, morning stiffness, muscle tenderness and myalgias.  Skin:  Negative for color change, rash and redness.  Allergic/Immunologic: Negative for susceptible to infections.  Neurological:  Positive for headaches. Negative for dizziness, numbness, memory loss and weakness.  Hematological:  Positive for bruising/bleeding tendency.  Psychiatric/Behavioral:  Negative for confusion.    PMFS History:  Patient Active Problem List   Diagnosis Date Noted   Occipital  neuralgia of left side 01/06/2020   Bilateral carpal tunnel syndrome 07/21/2019   Costochondritis 03/16/2019   Rib pain on right side 03/16/2019   Insulin resistance 11/27/2018   Class 3 severe obesity with serious comorbidity and body mass index (BMI) of 40.0 to 44.9 in adult [facility]) 11/27/2018   Fibromuscular dysplasia ([place]) 10/17/2018   Hypertension 10/17/2018   Bronchiectasis with (acute) exacerbation ([place]) 10/08/2018   OSA (obstructive sleep apnea) 08/20/2018   Rheumatoid arthritis ([place]) 04/17/2018   ILD (interstitial lung disease) ([place]) 04/17/2018   Dyspnea 04/01/2018   Bronchiectasis ([place]) 09/02/2017   LVH (left ventricular hypertrophy), mild 06/02/2017   Mitral regurgitation 05/22/2017   Jo-1 antibody positive 01/29/2017   Primary osteoarthritis of both feet 01/29/2017   Primary osteoarthritis of both hands 01/29/2017   Left shoulder tendonitis 01/17/2017   Vitamin D deficiency 04/26/2016   Cough 07/18/2015   MVP (mitral valve prolapse)    Erythema nodosum    INCONTINENCE, URGE 05/26/2010   Migraine headache 02/15/2010   Fibromyalgia 06/20/2009   Polymyositis ([place]) 02/11/2008   BRONCHIECTASIS 02/10/2008    Past Medical History:  Diagnosis Date   Abnormal liver function test    Bronchiectasis        Dyspnea    Fibromuscular dysplasia (HCC)    Fibromyalgia    Heart murmur    ILD (interstitial lung disease) ([place])    Joint pain    Menorrhagia    MIGRAINE HEADACHE    MVP (mitral valve prolapse)    Osteoarthritis    Polymyositis ([place])    Dr Gwyndolyn Saxon  [place]; chronic MTX   Pulmonary fibrosis ([place])    Rheumatoid arthritis ([place])    Sleep apnea    Vitamin B 12 deficiency    Vitamin D deficiency     Family History  Problem Relation Age of Onset   Diabetes Mother    Fibromyalgia Mother    Ulcers Mother    Heart failure Mother    Thyroid disease Mother    Obesity Mother    Multiple myeloma Father    Hypertension Father    Stroke Father    Lupus Sister    Other  Sister        abdominal adhesions resulting in bowel obstruction   Migraines Sister    Heart disease Brother        bypass surgery   Rheum arthritis Sister    Multiple myeloma Sister    Hypertension Sister    Heart attack Sister    Diabetes Sister    Hypertension Sister    Rheum arthritis Sister    Diabetes Sister    Diabetes Brother    Headache Other        siblings with headaches but not diagnosed as migraines   Past Surgical History:  Procedure Laterality Date   ablation uterine  2010   CARDIAC CATHETERIZATION  2002   normal   DILATION AND CURETTAGE OF UTERUS  08-31-08   Dr [name]   IR [place] SEL COM CAROTID INNOMINATE BILAT MOD SED  09/18/2018   IR ANGIO VERTEBRAL SEL VERTEBRAL BILAT MOD SED  09/18/2018   IR US GUIDE VASC ACCESS RIGHT  09/18/2018   Social History   Social History Narrative   Exercise: trying to walk - limited by fatigue   Lives at home with husband    Right handed   Caffeine: none    Immunization History  Administered Date(s) Administered   Influenza Split 04/10/2011, 06/18/2012, 05/06/2013   Influenza Whole 05/19/2008, 05/15/2009, 06/01/2010   Influenza,inj,Quad PF,6+ Mos 04/20/2014, 05/26/2015, 05/31/2016, 04/05/2017, 06/16/2018, 04/22/2019, 04/22/2020   PFIZER(Purple Top)SARS-COV-2 Vaccination 10/27/2019, 11/19/2019   Pneumococcal Conjugate-13 01/29/2017   Pneumococcal Polysaccharide-23 08/07/2007, 06/16/2018   Td 07/12/2009   Tdap 01/06/2020     Objective: Vital Signs: BP 126/84 (BP Location: Left Arm, Patient Position: Sitting, Cuff Size: Normal)   Pulse 68   Ht $R'5\' 8"'Em$  (1.727 m)   Wt 271 lb 6.4 oz (123.1 kg)   BMI 41.27 kg/m    Physical Exam Vitals and nursing note reviewed.  Constitutional:      Appearance: She is well-developed.  HENT:     Head: Normocephalic and atraumatic.  Eyes:     Conjunctiva/sclera: Conjunctivae normal.  Cardiovascular:     Rate and Rhythm: Normal rate and regular rhythm.     Heart  sounds: Normal heart sounds.  Pulmonary:     Effort: Pulmonary effort is normal.     Breath sounds: Normal breath sounds.  Abdominal:     General: Bowel sounds are normal.     Palpations: Abdomen is soft.  Musculoskeletal:     Cervical back: Normal range of motion.  Lymphadenopathy:     Cervical: No cervical adenopathy.  Skin:    General: Skin is warm and dry.     Capillary Refill: Capillary refill takes less than 2 seconds.  Neurological:     Mental Status: She is alert and oriented to person, place, and time.  Psychiatric:        Behavior: Behavior normal.  Musculoskeletal Exam: C-spine was in good range of motion.  Shoulder joints, elbow joints, wrist joints, MCPs PIPs and DIPs with good range of motion with no synovitis.  Hip joints, knee joints, ankles, MTPs and PIPs with good range of motion with no synovitis. CDAI Exam: CDAI Score: -- Patient Global: --; Provider Global: -- Swollen: --; Tender: -- Joint Exam 03/02/2021   No joint exam has been documented for this visit   There is currently no information documented on the homunculus. Go to the Rheumatology activity and complete the homunculus joint exam.  Investigation: No additional findings.  Imaging: No results found.  Recent Labs: Lab Results  Component Value Date   WBC 8.0 01/18/2021   HGB 13.9 01/18/2021   PLT 294 01/18/2021   NA 141 01/18/2021   K 4.1 01/18/2021   CL 106 01/18/2021   CO2 27 01/18/2021   GLUCOSE 95 01/18/2021   BUN 14 01/18/2021   CREATININE 0.69 01/18/2021   BILITOT 0.5 01/18/2021   ALKPHOS 76 10/12/2019   AST 15 01/18/2021   ALT 10 01/18/2021   PROT 8.2 (H) 01/18/2021   ALBUMIN 4.6 10/12/2019   CALCIUM 9.9 01/18/2021   GFRAA 114 01/18/2021   QFTBGOLDPLUS NEGATIVE 01/18/2021    Speciality Comments: Prior therapy includes: MTX (d/c due to ILD progresssion)  Procedures:  No procedures performed Allergies: Sulfonamide derivatives   Assessment / Plan:     Visit  Diagnoses: Rheumatoid arthritis involving multiple sites with positive rheumatoid factor (HCC) - +RF, +CCP: She had no synovitis on my examination.  Polymyositis ([place]) - Dx by Dr. Charlestine Night 2007, CK 801, Jo-1 positive: She had no muscular weakness or tenderness.  Her CK has been stable.  High risk medication use - Enbrel 50 mg sq injections once weekly (02/18/20-1st injection). Imuran 100 mg by mouth daily. d/c MXT due to progressioin of ILD.  Labs from January 18, 2021 were normal.  TB gold was negative on January 18, 2021.  She has been advised to get labs every 3 months to monitor for drug toxicity.  Recommendations regarding immunization were placed in the AVS.  She was advised to get annual skin examination to screen for nonmelanoma skin cancer.  Jo-1 antibody positive  ILD (interstitial lung disease) ([place]) - Examined by [name] on 05/25/20.  Continue breo and albuterol as needed.  PFT and 6-minute walk test updated on 03/22/20. High resolution CT on 03/01/20.   Bronchiectasis without complication (HCC)  Bilateral carpal tunnel syndrome  Primary osteoarthritis of both hands-joint protection muscle strengthening was discussed.  She has off-and-on discomfort in her hands.  Primary osteoarthritis of both feet-proper fitting shoes were discussed.  Fibromyalgia-she has generalized pain but currently not very active.  Other fatigue  History of mitral valve prolapse  Fibromuscular dysplasia (HCC)  History of migraine - She was evaluated by Dr. Jaynee Eagles on 09/22/20 who recommended increasing the frequency of ajovy injections to every 3 weeks and to try nurtec.   Vitamin D deficiency  Erythema nodosum - No recurrence.   Orders: No orders of the defined types were placed in this encounter.  No orders of the defined types were placed in this encounter.    Follow-Up Instructions: Return in about 5 months (around 08/02/2021) for PM, ILD.   Bo Merino, MD  Note - This record has been  created using Editor, commissioning.  Chart creation errors have been sought, but may not always  have been located. Such creation errors do not reflect on  the  standard of medical care.

## 2021-02-25 DIAGNOSIS — G4733 Obstructive sleep apnea (adult) (pediatric): Secondary | ICD-10-CM | POA: Diagnosis not present

## 2021-02-25 DIAGNOSIS — J471 Bronchiectasis with (acute) exacerbation: Secondary | ICD-10-CM | POA: Diagnosis not present

## 2021-03-02 ENCOUNTER — Ambulatory Visit: Payer: Federal, State, Local not specified - PPO | Admitting: Rheumatology

## 2021-03-02 ENCOUNTER — Other Ambulatory Visit: Payer: Self-pay

## 2021-03-02 ENCOUNTER — Encounter: Payer: Self-pay | Admitting: Rheumatology

## 2021-03-02 VITALS — BP 126/84 | HR 68 | Ht 68.0 in | Wt 271.4 lb

## 2021-03-02 DIAGNOSIS — M19041 Primary osteoarthritis, right hand: Secondary | ICD-10-CM

## 2021-03-02 DIAGNOSIS — L52 Erythema nodosum: Secondary | ICD-10-CM

## 2021-03-02 DIAGNOSIS — R5383 Other fatigue: Secondary | ICD-10-CM

## 2021-03-02 DIAGNOSIS — J849 Interstitial pulmonary disease, unspecified: Secondary | ICD-10-CM

## 2021-03-02 DIAGNOSIS — Z8669 Personal history of other diseases of the nervous system and sense organs: Secondary | ICD-10-CM

## 2021-03-02 DIAGNOSIS — M0579 Rheumatoid arthritis with rheumatoid factor of multiple sites without organ or systems involvement: Secondary | ICD-10-CM

## 2021-03-02 DIAGNOSIS — Z79899 Other long term (current) drug therapy: Secondary | ICD-10-CM

## 2021-03-02 DIAGNOSIS — M797 Fibromyalgia: Secondary | ICD-10-CM

## 2021-03-02 DIAGNOSIS — M19042 Primary osteoarthritis, left hand: Secondary | ICD-10-CM

## 2021-03-02 DIAGNOSIS — I773 Arterial fibromuscular dysplasia: Secondary | ICD-10-CM

## 2021-03-02 DIAGNOSIS — M3320 Polymyositis, organ involvement unspecified: Secondary | ICD-10-CM | POA: Diagnosis not present

## 2021-03-02 DIAGNOSIS — M19071 Primary osteoarthritis, right ankle and foot: Secondary | ICD-10-CM

## 2021-03-02 DIAGNOSIS — Z8679 Personal history of other diseases of the circulatory system: Secondary | ICD-10-CM

## 2021-03-02 DIAGNOSIS — R768 Other specified abnormal immunological findings in serum: Secondary | ICD-10-CM | POA: Diagnosis not present

## 2021-03-02 DIAGNOSIS — E559 Vitamin D deficiency, unspecified: Secondary | ICD-10-CM

## 2021-03-02 DIAGNOSIS — M19072 Primary osteoarthritis, left ankle and foot: Secondary | ICD-10-CM

## 2021-03-02 DIAGNOSIS — G5603 Carpal tunnel syndrome, bilateral upper limbs: Secondary | ICD-10-CM

## 2021-03-02 DIAGNOSIS — R7689 Other specified abnormal immunological findings in serum: Secondary | ICD-10-CM

## 2021-03-02 DIAGNOSIS — J479 Bronchiectasis, uncomplicated: Secondary | ICD-10-CM

## 2021-03-02 NOTE — Patient Instructions (Signed)
Standing Labs We placed an order today for your standing lab work.   Please have your standing labs drawn in September and every 3 months  If possible, please have your labs drawn 2 weeks prior to your appointment so that the provider can discuss your results at your appointment.  Please note that you may see your imaging and lab results in [place] before we have reviewed them. We may be awaiting multiple results to interpret others before contacting you. Please allow our office up to 72 hours to thoroughly review all of the results before contacting the office for clarification of your results.  We have open lab daily: Monday through Thursday from 1:30-4:30 PM and Friday from 1:30-4:00 PM at the office of Dr. Bo Merino, [facility].   Please be advised, all patients with office appointments requiring lab work will take precedent over walk-in lab work.  If possible, please come for your lab work on Monday and Friday afternoons, as you may experience shorter wait times. The office is located at [address] [place], [place], [place] [address] No appointment is necessary.   Labs are drawn by [facility]. Please bring your co-pay at the time of your lab draw.  You may receive a bill from [place] for your lab work.  If you wish to have your labs drawn at another location, please call the office 24 hours in advance to send orders.  If you have any questions regarding directions or hours of operation,  please call [phone].   As a reminder, please drink plenty of water prior to coming for your lab work. Thanks!   COVID-19 vaccine recommendations:   COVID-19 vaccine is recommended for everyone (unless you are allergic to a vaccine component), even if you are on a medication that suppresses your immune system.   If you are on Methotrexate, Cellcept (mycophenolate), Rinvoq, Morrie Sheldon, and Olumiant- hold the medication for 1 week after each vaccine. Hold Methotrexate for 2  weeks after the single dose COVID-19 vaccine.   If you are on Orencia subcutaneous injection - hold medication one week prior to and one week after the first COVID-19 vaccine dose (only).   If you are on Orencia IV infusions- time vaccination administration so that the first COVID-19 vaccination will occur four weeks after the infusion and postpone the subsequent infusion by one week.   If you are on Cyclophosphamide or Rituxan infusions please contact your doctor prior to receiving the COVID-19 vaccine.   Do not take Tylenol or any anti-inflammatory medications (NSAIDs) 24 hours prior to the COVID-19 vaccination.   There is no direct evidence about the efficacy of the COVID-19 vaccine in individuals who are on medications that suppress the immune system.   Even if you are fully vaccinated, and you are on any medications that suppress your immune system, please continue to wear a mask, maintain at least six feet social distance and practice hand hygiene.   If you develop a COVID-19 infection, please contact your PCP or our office to determine if you need monoclonal antibody infusion.  The booster vaccine is now available for immunocompromised patients.   Please see the following web sites for updated information.   [email or web address]   Vaccines You are taking a medication(s) that can suppress your immune system.  The following immunizations are recommended: Flu annually Covid-19  Td/Tdap (tetanus, diphtheria, pertussis) every 10 years Pneumonia (Prevnar 15 then Pneumovax 23 at least 1 year apart.  Alternatively, can take Prevnar 20 without needing  additional dose) Shingrix (after age 6): 2 doses from 4 weeks to 6 months apart  Please check with your PCP to make sure you are up to date.   If you test POSITIVE for COVID19 and have MILD to MODERATE symptoms: First, call your PCP if you would like to receive COVID19  treatment AND Hold your medications during the infection and for at least 1 week after your symptoms have resolved: Injectable medication (Benlysta, Cimzia, Cosentyx, Enbrel, Humira, Orencia, Remicade, Simponi, Stelara, Taltz, Tremfya) Methotrexate Leflunomide (Arava) Azathioprine Mycophenolate (Cellcept) Roma Kayser, or Rinvoq Otezla If you take Actemra or Kevzara, you DO NOT need to hold these for COVID19 infection.  If you test POSITIVE for COVID19 and have NO symptoms: First, call your PCP if you would like to receive COVID19 treatment AND Hold your medications for at least 10 days after the day that you tested positive Injectable medication (Benlysta, Cimzia, Cosentyx, Enbrel, Humira, Orencia, Remicade, Simponi, Stelara, Taltz, Tremfya) Methotrexate Leflunomide (Arava) Azathioprine Mycophenolate (Cellcept) Roma Kayser, or Rinvoq Otezla If you take Actemra or Kevzara, you DO NOT need to hold these for COVID19 infection.  If you have signs or symptoms of an infection or start antibiotics: First, call your PCP for workup of your infection. Hold your medication through the infection, until you complete your antibiotics, and until symptoms resolve if you take the following: Injectable medication (Actemra, Benlysta, Cimzia, Cosentyx, Enbrel, Humira, Kevzara, Orencia, Remicade, Simponi, Stelara, Taltz, Tremfya) Methotrexate Leflunomide (Arava) Mycophenolate (Cellcept) Morrie Sheldon, Olumiant, or Rinvoq  Please a schedule annual skin examination by dermatologist to screen for nonmelanoma skin cancer while you are on Enbrel Heart Disease Prevention   Your inflammatory disease increases your risk of heart disease which includes heart attack, stroke, atrial fibrillation (irregular heartbeats), high blood pressure, heart failure and atherosclerosis (plaque in the arteries).  It is important to reduce your risk by:   Keep blood pressure, cholesterol, and blood sugar at healthy levels    Smoking Cessation   Maintain a healthy weight  BMI 20-25   Eat a healthy diet  Plenty of fresh fruit, vegetables, and whole grains  Limit saturated fats, foods high in sodium, and added sugars  DASH and Mediterranean diet   Increase physical activity  Recommend moderate physically activity for 150 minutes per week/ 30 minutes a day for five days a week These can be broken up into three separate ten-minute sessions during the day.   Reduce Stress  Meditation, slow breathing exercises, yoga, coloring books  Dental visits twice a year

## 2021-03-05 ENCOUNTER — Other Ambulatory Visit: Payer: Self-pay | Admitting: Physician Assistant

## 2021-03-06 NOTE — Telephone Encounter (Signed)
Next Visit: 08/03/2021  Last Visit: 03/02/2021  Last Fill: 11/04/2019  Okay to refill voltaren gel?

## 2021-03-28 DIAGNOSIS — J471 Bronchiectasis with (acute) exacerbation: Secondary | ICD-10-CM | POA: Diagnosis not present

## 2021-03-28 DIAGNOSIS — G4733 Obstructive sleep apnea (adult) (pediatric): Secondary | ICD-10-CM | POA: Diagnosis not present

## 2021-04-06 ENCOUNTER — Telehealth: Payer: Self-pay | Admitting: Pulmonary Disease

## 2021-04-06 NOTE — Telephone Encounter (Signed)
Called and spoke with patient. She verbalized understanding and will proceed with an at home covid test. She is aware to call us back if the test result is positive.   Nothing further needed at time of call.

## 2021-04-06 NOTE — Telephone Encounter (Signed)
Primary Pulmonologist: [name] Last office visit and with whom: [name] with 02/01/21 What do we see them for (pulmonary problems): Bronchiectasis without complication, ILD and OSA Last OV assessment/plan: see below  Was appointment offered to patient (explain)?    Assessment/Plan:    ILD with UIP pattern and Bronchiectasis in setting of RA and polymyositis. - continue breo and prn albuterol - prn mucinex - limit use of flutter valve >> makes headache worse - discussed symptoms/signs to monitor for that would suggest an exacerbation   Rheumatoid arthritis with positive RF and CCP, polymyositis. - followed by Dr. Bo Merino with rheumatology - maintained on imuran and enbrel   Obstructive sleep apnea. - she is compliant with CPAP and reports benefit from therapy - she uses [employer] for her DME - continue auto CPAP 7 to 15 cm H2O   Nocturnal hypoxemia. - 2.5 liters oxygen at night with CPAP and prn during the day with dyspnea and headache - she will call for script if she finds a POC she would like to purchase; explained this might not be covered by insurance   COVID 19 advice. - I have advised her to schedule COVID vaccine booster   Migraine headache. - followed by Dr. Sarina Ill with [facility]   Time Spent Involved in Patient Care on Day of Examination:  26 minutes   Follow up:    Patient Instructions  Follow up in 6 months     Reason for call: patient called and stated had covid booster few days ago. Cough has been getting worse since yesterday. Has become deeper along with fatigue, achy joints and occ clear mucous production. Occ SOB at times. Patient had similar symptoms when getting 1st and 2nd shot. No fevers or chills. Using meds as prescribed and not having to wear O2 yet but uses it at night with CPAP Just wants any recs. Will route to Tammy as [name] is off today.  Tammy, please advise. Thanks!  (examples of things to ask: : When did  symptoms start? Fever? Cough? Productive? Color to sputum? More sputum than usual? Wheezing? Have you needed increased oxygen? Are you taking your respiratory medications? What over the counter measures have you tried?)  Allergies  Allergen Reactions   Sulfonamide Derivatives     REACTION: hives    Immunization History  Administered Date(s) Administered   Influenza Split 04/10/2011, 06/18/2012, 05/06/2013   Influenza Whole 05/19/2008, 05/15/2009, 06/01/2010   Influenza,inj,Quad PF,6+ Mos 04/20/2014, 05/26/2015, 05/31/2016, 04/05/2017, 06/16/2018, 04/22/2019, 04/22/2020   PFIZER(Purple Top)SARS-COV-2 Vaccination 10/27/2019, 11/19/2019   Pneumococcal Conjugate-13 01/29/2017   Pneumococcal Polysaccharide-23 08/07/2007, 06/16/2018   Td 07/12/2009   Tdap 01/06/2020

## 2021-04-06 NOTE — Telephone Encounter (Signed)
Would try to treat the symptoms, Mucinex DM twice daily as needed for cough and congestion.  Fluids and rest.  Tylenol as needed for body aches.  I would also recommend a home COVID test.  If it is positive please give [name] a call back.  Please contact office for sooner follow up if symptoms do not improve or worsen or seek emergency care

## 2021-04-11 ENCOUNTER — Telehealth: Payer: Self-pay | Admitting: Adult Health

## 2021-04-11 NOTE — Telephone Encounter (Signed)
Emgality PA completed on Cover My Meds. Key: [id number]. Awaiting determination from [facility].

## 2021-04-11 NOTE — Telephone Encounter (Signed)
Pt is asking for a call to confirm if the PA was submitted for Emgality.  Pt states her insurance is telling her they have not received anything.

## 2021-04-11 NOTE — Telephone Encounter (Signed)
The Prior Authorization request has been approved for Emgality '120MG'$ /ML [place] [facility]. The authorization is valid from 03/12/2021 through 10/08/2021. A letter of explanation will also be mailed to the patient.   Faxed to pharmacy. Received a receipt of confirmation.

## 2021-04-16 ENCOUNTER — Other Ambulatory Visit: Payer: Self-pay | Admitting: Internal Medicine

## 2021-04-16 DIAGNOSIS — R7303 Prediabetes: Secondary | ICD-10-CM

## 2021-04-26 ENCOUNTER — Encounter: Payer: Federal, State, Local not specified - PPO | Admitting: Internal Medicine

## 2021-04-28 DIAGNOSIS — J471 Bronchiectasis with (acute) exacerbation: Secondary | ICD-10-CM | POA: Diagnosis not present

## 2021-04-28 DIAGNOSIS — G4733 Obstructive sleep apnea (adult) (pediatric): Secondary | ICD-10-CM | POA: Diagnosis not present

## 2021-05-03 ENCOUNTER — Other Ambulatory Visit: Payer: Self-pay | Admitting: Physician Assistant

## 2021-05-03 DIAGNOSIS — Z79899 Other long term (current) drug therapy: Secondary | ICD-10-CM

## 2021-05-03 DIAGNOSIS — M0579 Rheumatoid arthritis with rheumatoid factor of multiple sites without organ or systems involvement: Secondary | ICD-10-CM

## 2021-05-03 NOTE — Telephone Encounter (Signed)
Next Visit: 08/03/2021  Last Visit: 03/02/2021  Last Fill: 11/25/2020  CC:[id number] arthritis involving multiple sites with positive rheumatoid factor   Current Dose per office note 03/02/2021: Enbrel 50 mg sq injections once weekly   Labs: 01/18/2021 CBC, CMP and CK are normal.  TB Gold: 01/18/2021 Neg    Patient advised she is due to update labs.   Okay to refill Enbrel?

## 2021-05-08 ENCOUNTER — Other Ambulatory Visit: Payer: Self-pay | Admitting: *Deleted

## 2021-05-08 MED ORDER — AZATHIOPRINE 50 MG PO TABS: ORAL_TABLET | ORAL | 2 refills | Status: DC

## 2021-05-08 NOTE — Telephone Encounter (Addendum)
Next Visit: 08/03/2021  Last Visit: 03/02/2021  Last Fill: 12/29/2020  DX: Rheumatoid arthritis involving multiple sites with positive rheumatoid factor   Current Dose per office note 03/02/2021: Imuran 100 mg by mouth daily.  Labs: 01/18/2021, CBC, CMP and CK are normal. I called patient, patient has appt to have labs performed on 05/10/2021.  Okay to refill Imuran?

## 2021-05-10 ENCOUNTER — Other Ambulatory Visit: Payer: Self-pay | Admitting: *Deleted

## 2021-05-10 ENCOUNTER — Telehealth: Payer: Self-pay

## 2021-05-10 DIAGNOSIS — Z79899 Other long term (current) drug therapy: Secondary | ICD-10-CM | POA: Diagnosis not present

## 2021-05-10 NOTE — Telephone Encounter (Signed)
Patient requested labwork be sent to [facility] on [address].  Patient is there now and they cannot find an order in the system.

## 2021-05-10 NOTE — Telephone Encounter (Signed)
Lab Orders released.  

## 2021-05-11 LAB — COMPLETE METABOLIC PANEL WITH GFR
AG Ratio: 1.5 (calc) (ref 1.0–2.5)
ALT: 14 U/L (ref 6–29)
AST: 17 U/L (ref 10–35)
Albumin: 4.5 g/dL (ref 3.6–5.1)
Alkaline phosphatase (APISO): 63 U/L (ref 37–153)
BUN: 11 mg/dL (ref 7–25)
CO2: 27 mmol/L (ref 20–32)
Calcium: 10 mg/dL (ref 8.6–10.4)
Chloride: 105 mmol/L (ref 98–110)
Creat: 0.61 mg/dL (ref 0.50–1.03)
Globulin: 3.1 g/dL (calc) (ref 1.9–3.7)
Glucose, Bld: 92 mg/dL (ref 65–139)
Potassium: 5.3 mmol/L (ref 3.5–5.3)
Sodium: 141 mmol/L (ref 135–146)
Total Bilirubin: 0.4 mg/dL (ref 0.2–1.2)
Total Protein: 7.6 g/dL (ref 6.1–8.1)
eGFR: 106 mL/min/{1.73_m2} (ref >=60)

## 2021-05-11 LAB — CBC WITH DIFFERENTIAL/PLATELET
Absolute Monocytes: 608 cells/uL (ref 200–950)
Basophils Absolute: 30 cells/uL (ref 0–200)
Basophils Relative: 0.4 %
Eosinophils Absolute: 210 cells/uL (ref 15–500)
Eosinophils Relative: 2.8 %
HCT: 41.4 % (ref 35.0–45.0)
Hemoglobin: 13.8 g/dL (ref 11.7–15.5)
Lymphs Abs: 1650 cells/uL (ref 850–3900)
MCH: 28.5 pg (ref 27.0–33.0)
MCHC: 33.3 g/dL (ref 32.0–36.0)
MCV: 85.4 fL (ref 80.0–100.0)
MPV: 10.8 fL (ref 7.5–12.5)
Monocytes Relative: 8.1 %
Neutro Abs: 5003 cells/uL (ref 1500–7800)
Neutrophils Relative %: 66.7 %
Platelets: 313 10*3/uL (ref 140–400)
RBC: 4.85 10*6/uL (ref 3.80–5.10)
RDW: 12.1 % (ref 11.0–15.0)
Total Lymphocyte: 22 %
WBC: 7.5 10*3/uL (ref 3.8–10.8)

## 2021-05-11 NOTE — Progress Notes (Signed)
CBC and CMP WNL

## 2021-05-28 DIAGNOSIS — G4733 Obstructive sleep apnea (adult) (pediatric): Secondary | ICD-10-CM | POA: Diagnosis not present

## 2021-05-28 DIAGNOSIS — J471 Bronchiectasis with (acute) exacerbation: Secondary | ICD-10-CM | POA: Diagnosis not present

## 2021-06-04 DIAGNOSIS — G4733 Obstructive sleep apnea (adult) (pediatric): Secondary | ICD-10-CM | POA: Diagnosis not present

## 2021-06-04 DIAGNOSIS — J471 Bronchiectasis with (acute) exacerbation: Secondary | ICD-10-CM | POA: Diagnosis not present

## 2021-06-16 ENCOUNTER — Telehealth: Payer: Self-pay | Admitting: Pulmonary Disease

## 2021-06-16 NOTE — Telephone Encounter (Signed)
Noted.   VS please advise. Does patient still need 6MW, CT, and PFT. CT done in July. Thanks :)

## 2021-06-16 NOTE — Telephone Encounter (Signed)
I would like for her to do PFT.  Can skip repeat CT and 6MW.

## 2021-06-19 NOTE — Telephone Encounter (Signed)
I have made a follow up for the patient to have a PFT prior to the OV with [name]. Nothing further needed.

## 2021-06-27 ENCOUNTER — Encounter: Payer: Self-pay | Admitting: Internal Medicine

## 2021-06-27 NOTE — Patient Instructions (Addendum)
Blood work was ordered.     Medications changes include :   none  Your prescription(s) have been submitted to your pharmacy. Please take as directed and contact our office if you believe you are having problem(s) with the medication(s).   Please followup in 6 months   Health Maintenance, Female Adopting a healthy lifestyle and getting preventive care are important in promoting health and wellness. Ask your health care provider about: The right schedule for you to have regular tests and exams. Things you can do on your own to prevent diseases and keep yourself healthy. What should I know about diet, weight, and exercise? Eat a healthy diet  Eat a diet that includes plenty of vegetables, fruits, low-fat dairy products, and lean protein. Do not eat a lot of foods that are high in solid fats, added sugars, or sodium. Maintain a healthy weight Body mass index (BMI) is used to identify weight problems. It estimates body fat based on height and weight. Your health care provider can help determine your BMI and help you achieve or maintain a healthy weight. Get regular exercise Get regular exercise. This is one of the most important things you can do for your health. Most adults should: Exercise for at least 150 minutes each week. The exercise should increase your heart rate and make you sweat (moderate-intensity exercise). Do strengthening exercises at least twice a week. This is in addition to the moderate-intensity exercise. Spend less time sitting. Even light physical activity can be beneficial. Watch cholesterol and blood lipids Have your blood tested for lipids and cholesterol at 55 years of age, then have this test every 5 years. Have your cholesterol levels checked more often if: Your lipid or cholesterol levels are high. You are older than 55 years of age. You are at high risk for heart disease. What should I know about cancer screening? Depending on your health history and family  history, you may need to have cancer screening at various ages. This may include screening for: Breast cancer. Cervical cancer. Colorectal cancer. Skin cancer. Lung cancer. What should I know about heart disease, diabetes, and high blood pressure? Blood pressure and heart disease High blood pressure causes heart disease and increases the risk of stroke. This is more likely to develop in people who have high blood pressure readings or are overweight. Have your blood pressure checked: Every 3-5 years if you are 3-11 years of age. Every year if you are 57 years old or older. Diabetes Have regular diabetes screenings. This checks your fasting blood sugar level. Have the screening done: Once every three years after age 47 if you are at a normal weight and have a low risk for diabetes. More often and at a younger age if you are overweight or have a high risk for diabetes. What should I know about preventing infection? Hepatitis B If you have a higher risk for hepatitis B, you should be screened for this virus. Talk with your health care provider to find out if you are at risk for hepatitis B infection. Hepatitis C Testing is recommended for: Everyone born from [birth year] through 1965. Anyone with known risk factors for hepatitis C. Sexually transmitted infections (STIs) Get screened for STIs, including gonorrhea and chlamydia, if: You are sexually active and are younger than 55 years of age. You are older than 55 years of age and your health care provider tells you that you are at risk for this type of infection. Your sexual activity has changed since  you were last screened, and you are at increased risk for chlamydia or gonorrhea. Ask your health care provider if you are at risk. Ask your health care provider about whether you are at high risk for HIV. Your health care provider may recommend a prescription medicine to help prevent HIV infection. If you choose to take medicine to prevent HIV, you  should first get tested for HIV. You should then be tested every 3 months for as long as you are taking the medicine. Pregnancy If you are about to stop having your period (premenopausal) and you may become pregnant, seek counseling before you get pregnant. Take 400 to 800 micrograms (mcg) of folic acid every day if you become pregnant. Ask for birth control (contraception) if you want to prevent pregnancy. Osteoporosis and menopause Osteoporosis is a disease in which the bones lose minerals and strength with aging. This can result in bone fractures. If you are 58 years old or older, or if you are at risk for osteoporosis and fractures, ask your health care provider if you should: Be screened for bone loss. Take a calcium or vitamin D supplement to lower your risk of fractures. Be given hormone replacement therapy (HRT) to treat symptoms of menopause. Follow these instructions at home: Alcohol use Do not drink alcohol if: Your health care provider tells you not to drink. You are pregnant, may be pregnant, or are planning to become pregnant. If you drink alcohol: Limit how much you have to: 0-1 drink a day. Know how much alcohol is in your drink. In the [place] one drink equals one 12 oz bottle of beer (355 mL), one 5 oz glass of wine (148 mL), or one 1 oz glass of hard liquor (44 mL). Lifestyle Do not use any products that contain nicotine or tobacco. These products include cigarettes, chewing tobacco, and vaping devices, such as e-cigarettes. If you need help quitting, ask your health care provider. Do not use street drugs. Do not share needles. Ask your health care provider for help if you need support or information about quitting drugs. General instructions Schedule regular health, dental, and eye exams. Stay current with your vaccines. Tell your health care provider if: You often feel depressed. You have ever been abused or do not feel safe at home. Summary Adopting a healthy  lifestyle and getting preventive care are important in promoting health and wellness. Follow your health care provider's instructions about healthy diet, exercising, and getting tested or screened for diseases. Follow your health care provider's instructions on monitoring your cholesterol and blood pressure. This information is not intended to replace advice given to you by your health care provider. Make sure you discuss any questions you have with your health care provider. Document Revised: 12/12/2020 Document Reviewed: 12/12/2020 Elsevier Patient Education  [place].

## 2021-06-27 NOTE — Progress Notes (Signed)
Subjective:    Patient ID: Jill Harper, female    DOB: 07/05/1966, 55 y.o.   MRN: [id number]   This visit occurred during the SARS-CoV-2 public health emergency.  Safety protocols were in place, including screening questions prior to the visit, additional usage of staff PPE, and extensive cleaning of exam room while observing appropriate contact time as indicated for disinfecting solutions.    HPI She is here for a physical exam.   She denies concerns.   She is still working from home.    Medications and allergies reviewed with patient and updated if appropriate.  Patient Active Problem List   Diagnosis Date Noted   Occipital neuralgia of left side 01/06/2020   Bilateral carpal tunnel syndrome 07/21/2019   Costochondritis 03/16/2019   Rib pain on right side 03/16/2019   Insulin resistance 11/27/2018   Class 3 severe obesity with serious comorbidity and body mass index (BMI) of 40.0 to 44.9 in adult [facility]) 11/27/2018   Fibromuscular dysplasia ([place]) 10/17/2018   Hypertension 10/17/2018   OSA (obstructive sleep apnea) 08/20/2018   Rheumatoid arthritis ([place]) 04/17/2018   ILD (interstitial lung disease) ([place]) 04/17/2018   Dyspnea 04/01/2018   Bronchiectasis ([place]) 09/02/2017   LVH (left ventricular hypertrophy), mild 06/02/2017   Mitral regurgitation 05/22/2017   Jo-1 antibody positive 01/29/2017   Primary osteoarthritis of both feet 01/29/2017   Primary osteoarthritis of both hands 01/29/2017   Left shoulder tendonitis 01/17/2017   Vitamin D deficiency 04/26/2016   Cough 07/18/2015   MVP (mitral valve prolapse)    Erythema nodosum    INCONTINENCE, URGE 05/26/2010   Migraine headache 02/15/2010   Fibromyalgia 06/20/2009   Polymyositis ([place]) 02/11/2008   BRONCHIECTASIS 02/10/2008    Current Outpatient Medications on File Prior to Visit  Medication Sig Dispense Refill   albuterol (PROVENTIL HFA) 108 (90 Base) MCG/ACT inhaler Inhale 2 puffs into the lungs 4 (four) times  daily as needed. Reported on 09/03/2015 1 Inhaler 4   albuterol (PROVENTIL) (2.5 MG/3ML) 0.083% nebulizer solution Take 3 mLs (2.5 mg total) by nebulization every 6 (six) hours as needed for wheezing or shortness of breath. 120 vial 5   aspirin EC 81 MG tablet Take 81 mg by mouth daily.     atenolol (TENORMIN) 25 MG tablet TAKE 1/2 TABLET BY MOUTH DAILY AS NEEDED FOR MITRAL PROLAPSE 45 tablet 0   azaTHIOprine (IMURAN) 50 MG tablet Take 2 tablets by mouth daily. 60 tablet 2   BREO ELLIPTA 100-25 MCG/INH AEPB INHALE 1 PUFF INTO THE LUNGS DAILY 60 each 5   Cholecalciferol (VITAMIN D) 50 MCG (2000 UT) CAPS Take by mouth.     diclofenac Sodium (VOLTAREN) 1 % GEL APPLY 2 TO 4 GRAMS TO AFFECTED JOINT FOUR TIMES DAILY AS NEEDED 400 g 2   eletriptan (RELPAX) 20 MG tablet TAKE 1 TABLET BY MOUTH AS NEEDED FOR MIGRAINE 10 tablet 11   folic acid (FOLVITE) 1 MG tablet TAKE 2 TABLETS(2 MG) BY MOUTH DAILY 180 tablet 3   gabapentin (NEURONTIN) 100 MG capsule Take 2 capsules (200 mg total) by mouth at bedtime. 60 capsule 5   Galcanezumab-gnlm (EMGALITY) 120 MG/ML SOAJ Inject 120 mg into the skin every 30 (thirty) days. 1.12 mL 5   guaiFENesin (MUCINEX) 600 MG 12 hr tablet Take 2 tablets (1,200 mg total) by mouth 2 (two) times daily as needed for cough or to loosen phlegm. (Patient taking differently: Take 1,200 mg by mouth 2 (two) times daily.)  metFORMIN (GLUCOPHAGE) 500 MG tablet TAKE 1 TABLET(500 MG) BY MOUTH TWICE DAILY WITH A MEAL 180 tablet 1   Multiple Vitamins-Minerals (MULTIVITAMIN GUMMIES ADULT PO) Take 2 each by mouth daily.     Respiratory Therapy Supplies (FLUTTER) DEVI Use as directed (Patient taking differently: as needed. Use as directed) 1 each 0   Rimegepant Sulfate (NURTEC) 75 MG TBDP Take 75 mg by mouth daily as needed. For migraines. Take as close to onset of migraine as possible. One daily maximum. 10 tablet 6   telmisartan (MICARDIS) 20 MG tablet TAKE 1 TABLET(20 MG) BY MOUTH DAILY 90 tablet  1   No current facility-administered medications on file prior to visit.    Past Medical History:  Diagnosis Date   Abnormal liver function test    Bronchiectasis        Dyspnea    Fibromuscular dysplasia ([facility])    Fibromyalgia    Heart murmur    ILD (interstitial lung disease) ([facility])    Joint pain    Menorrhagia    MIGRAINE HEADACHE    MVP (mitral valve prolapse)    Osteoarthritis    Polymyositis ([facility])    Dr [name]; chronic MTX   Pulmonary fibrosis ([facility])    Rheumatoid arthritis ([place])    Sleep apnea    Vitamin B 12 deficiency    Vitamin D deficiency     Past Surgical History:  Procedure Laterality Date   ablation uterine  2010   CARDIAC CATHETERIZATION  2002   normal   DILATION AND CURETTAGE OF UTERUS  08-31-08   Dr [name]   IR [place] SEL COM CAROTID INNOMINATE BILAT MOD SED  09/18/2018   IR ANGIO VERTEBRAL SEL VERTEBRAL BILAT MOD SED  09/18/2018   IR US GUIDE VASC ACCESS RIGHT  09/18/2018    Social History   Socioeconomic History   Marital status: Married    Spouse name: Hadessah Grennan   Number of children: 1   Years of education: Not on file   Highest education level: High school graduate  Occupational History   Occupation: Compliant and Injury Clerk  Tobacco Use   Smoking status: Never   Smokeless tobacco: Never   Tobacco comments:    Married, lives with spouse. 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  Vaping Use   Vaping Use: Never used  Substance and Sexual Activity   Alcohol use: Not Currently    Comment: maybe a wine cooler once every other year   Drug use: Never   Sexual activity: Yes    Birth control/protection: Surgical  Other Topics Concern   Not on file  Social History Narrative   Exercise: trying to walk - limited by fatigue   Lives at home with husband    Right handed   Caffeine: none    Social Determinants of Health   Financial Resource Strain: Not on file  Food Insecurity:  Not on file  Transportation Needs: Not on file  Physical Activity: Not on file  Stress: Not on file  Social Connections: Not on file    Family History  Problem Relation Age of Onset   Diabetes Mother    Fibromyalgia Mother    Ulcers Mother    Heart failure Mother    Thyroid disease Mother    Obesity Mother    Multiple myeloma Father    Hypertension Father    Stroke Father    Lupus Sister    Other  Sister        abdominal adhesions resulting in bowel obstruction   Migraines Sister    Heart disease Brother        bypass surgery   Rheum arthritis Sister    Multiple myeloma Sister    Hypertension Sister    Heart attack Sister    Diabetes Sister    Hypertension Sister    Rheum arthritis Sister    Diabetes Sister    Diabetes Brother    Headache Other        siblings with headaches but not diagnosed as migraines    Review of Systems  Constitutional:  Negative for chills and fever.  Eyes:  Negative for visual disturbance.  Respiratory:  Positive for cough (chronic), shortness of breath (mild) and wheezing (slight wheeze recently).   Cardiovascular:  Negative for chest pain, palpitations and leg swelling.  Gastrointestinal:  Negative for abdominal pain, blood in stool, constipation, diarrhea and nausea.       No gerd  Genitourinary:  Negative for dysuria.  Musculoskeletal:  Positive for arthralgias, joint swelling and myalgias (intermittent muscle weakness).  Skin:  Negative for rash.  Neurological:  Positive for numbness and headaches. Negative for dizziness.  Psychiatric/Behavioral:  Negative for dysphoric mood.       Objective:   Vitals:   06/28/21 1525  BP: 126/78  Pulse: 87  Temp: 98.2 F (36.8 C)  SpO2: 99%   Filed Weights   06/28/21 1525  Weight: 282 lb (127.9 kg)   Body mass index is 42.88 kg/m.  BP Readings from Last 3 Encounters:  06/28/21 126/78  03/02/21 126/84  02/07/21 139/74    Wt Readings from Last 3 Encounters:  06/28/21 282 lb (127.9  kg)  03/02/21 271 lb 6.4 oz (123.1 kg)  02/07/21 279 lb (126.6 kg)    Depression screen [facility] 2/9 10/21/2020 10/20/2019 10/13/2018 10/09/2018 04/05/2017  Decreased Interest 0 0 0 3 0  Down, Depressed, Hopeless 0 0 0 3 0  PHQ - 2 Score 0 0 0 6 0  Altered sleeping 0 - 3 3 -  Tired, decreased energy 0 - 3 3 -  Change in appetite 0 - 1 1 -  Feeling bad or failure about yourself  0 - 0 3 -  Trouble concentrating 0 - 0 0 -  Moving slowly or fidgety/restless 0 - 3 3 -  Suicidal thoughts 0 - 0 0 -  PHQ-9 Score 0 - 10 19 -  Difficult doing work/chores - - Somewhat difficult Not difficult at all -  Some recent data might be hidden     GAD 7 : Generalized Anxiety Score 10/21/2020  Nervous, Anxious, on Edge 0  Control/stop worrying 0  Worry too much - different things 0  Trouble relaxing 0  Restless 0  Easily annoyed or irritable 0  Afraid - awful might happen 0  Total GAD 7 Score 0       Physical Exam Constitutional: She appears well-developed and well-nourished. No distress.  HENT:  Head: Normocephalic and atraumatic.  Right Ear: External ear normal. Normal ear canal and TM Left Ear: External ear normal.  Normal ear canal and TM Mouth/Throat: Oropharynx is clear and moist.  Eyes: Conjunctivae and EOM are normal.  Neck: Neck supple. No tracheal deviation present. No thyromegaly present.  No carotid bruit  Cardiovascular: Normal rate, regular rhythm and normal heart sounds.   No murmur heard.  No edema. Pulmonary/Chest: Effort normal and breath sounds normal. No respiratory distress.  She has no wheezes. She has no rales.  Breast: deferred   Abdominal: Soft. She exhibits no distension. There is no tenderness.  Lymphadenopathy: She has no cervical adenopathy.  Skin: Skin is warm and dry. She is not diaphoretic.  Psychiatric: She has a normal mood and affect. Her behavior is normal.     Lab Results  Component Value Date   WBC 7.5 05/10/2021   HGB 13.8 05/10/2021   HCT 41.4  05/10/2021   PLT 313 05/10/2021   GLUCOSE 92 05/10/2021   CHOL 154 10/12/2019   TRIG 43 10/12/2019   HDL 61 10/12/2019   LDLCALC 83 10/12/2019   ALT 14 05/10/2021   AST 17 05/10/2021   NA 141 05/10/2021   K 5.3 05/10/2021   CL 105 05/10/2021   CREATININE 0.61 05/10/2021   BUN 11 05/10/2021   CO2 27 05/10/2021   TSH 4.040 10/12/2019   INR 1.03 09/18/2018   HGBA1C 5.5 10/12/2019         Assessment & Plan:   Physical exam: Screening blood work  ordered Exercise  not regular-discussed trying to fit in exercise during the day since she is working at home Weight  advised weight loss-discussed eating more regularly and doing some meal prep Substance abuse  none   Reviewed recommended immunizations.   Health Maintenance  Topic Date Due   Zoster Vaccines- Shingrix (1 of 2) Never done   COVID-19 Vaccine (3 - Pfizer risk series) 12/17/2019   MAMMOGRAM  06/26/2020   INFLUENZA VACCINE  03/06/2021   PAP SMEAR-Modifier  06/26/2021   COLONOSCOPY (Pts 45-86yr Insurance coverage will need to be confirmed)  04/20/2026   TETANUS/TDAP  01/05/2030   Pneumococcal Vaccine [id number] old (4 - PPSV23 if available, else PCV20) 11/09/2030   Hepatitis C Screening  Completed   HIV Screening  Completed   HPV VACCINES  Aged Out          See Problem List for Assessment and Plan of chronic medical problems.

## 2021-06-28 ENCOUNTER — Other Ambulatory Visit: Payer: Self-pay | Admitting: Physician Assistant

## 2021-06-28 ENCOUNTER — Other Ambulatory Visit: Payer: Self-pay

## 2021-06-28 ENCOUNTER — Ambulatory Visit (INDEPENDENT_AMBULATORY_CARE_PROVIDER_SITE_OTHER): Payer: Federal, State, Local not specified - PPO | Admitting: Internal Medicine

## 2021-06-28 VITALS — BP 126/78 | HR 87 | Temp 98.2°F | Ht 68.0 in | Wt 282.0 lb

## 2021-06-28 DIAGNOSIS — E559 Vitamin D deficiency, unspecified: Secondary | ICD-10-CM

## 2021-06-28 DIAGNOSIS — J471 Bronchiectasis with (acute) exacerbation: Secondary | ICD-10-CM | POA: Diagnosis not present

## 2021-06-28 DIAGNOSIS — R202 Paresthesia of skin: Secondary | ICD-10-CM | POA: Insufficient documentation

## 2021-06-28 DIAGNOSIS — G43809 Other migraine, not intractable, without status migrainosus: Secondary | ICD-10-CM

## 2021-06-28 DIAGNOSIS — M069 Rheumatoid arthritis, unspecified: Secondary | ICD-10-CM

## 2021-06-28 DIAGNOSIS — Z0000 Encounter for general adult medical examination without abnormal findings: Secondary | ICD-10-CM

## 2021-06-28 DIAGNOSIS — E8881 Metabolic syndrome: Secondary | ICD-10-CM

## 2021-06-28 DIAGNOSIS — M0579 Rheumatoid arthritis with rheumatoid factor of multiple sites without organ or systems involvement: Secondary | ICD-10-CM

## 2021-06-28 DIAGNOSIS — M3320 Polymyositis, organ involvement unspecified: Secondary | ICD-10-CM

## 2021-06-28 DIAGNOSIS — I10 Essential (primary) hypertension: Secondary | ICD-10-CM | POA: Diagnosis not present

## 2021-06-28 DIAGNOSIS — I341 Nonrheumatic mitral (valve) prolapse: Secondary | ICD-10-CM

## 2021-06-28 DIAGNOSIS — G4733 Obstructive sleep apnea (adult) (pediatric): Secondary | ICD-10-CM | POA: Diagnosis not present

## 2021-06-28 NOTE — Telephone Encounter (Signed)
Next Visit: 08/03/2021  Last Visit: 03/02/2021  Last Fill: 05/03/2021 (30 day supply)  QX:[id number] arthritis involving multiple sites with positive rheumatoid factor   Current Dose per office note 03/02/2021: Enbrel 50 mg sq injections once weekly   Labs: 05/10/2021 CBC and CMP WNL  TB Gold: 01/18/2021 Neg    Okay to refill Enbrel?

## 2021-06-28 NOTE — Assessment & Plan Note (Signed)
Chronic Management per rheumatology

## 2021-06-28 NOTE — Assessment & Plan Note (Signed)
Chronic Check a1c Continue metformin 500 mg bid  Low sugar / carb diet Stressed regular exercise

## 2021-06-28 NOTE — Assessment & Plan Note (Signed)
Chronic Managed by Dr Jaynee Eagles On emgality and gabapentin  Taking nurtec prn or relpax prn Overall fairly controlled

## 2021-06-28 NOTE — Assessment & Plan Note (Signed)
Chronic BP well controlled Continue telmisartan 20 mg daily cmp

## 2021-06-28 NOTE — Assessment & Plan Note (Signed)
Chronic Taking vitamin D daily Check vitamin D level  

## 2021-06-28 NOTE — Assessment & Plan Note (Signed)
New Intermittent tingling in fingers at times We will check B12 level, especially since she is on metformin

## 2021-06-28 NOTE — Assessment & Plan Note (Signed)
Chronic Intermittent - infrequent palps Continue atenolol 12.5 mg daily prn - does not take often

## 2021-07-03 ENCOUNTER — Ambulatory Visit: Payer: Federal, State, Local not specified - PPO

## 2021-07-03 ENCOUNTER — Telehealth: Payer: Self-pay | Admitting: Pulmonary Disease

## 2021-07-03 DIAGNOSIS — R042 Hemoptysis: Secondary | ICD-10-CM

## 2021-07-03 NOTE — Telephone Encounter (Signed)
Please schedule an ROV today or tomorrow with NP and have her do chest xray prior to visit.

## 2021-07-03 NOTE — Telephone Encounter (Signed)
Called and spoke with patient. She has been scheduled to see Beth tomorrow at 930am. I have placed an order for the CXR as stat. She will come in early for the CXR.   Nothing further needed at time of call.

## 2021-07-03 NOTE — Telephone Encounter (Signed)
Spoke to patient.  Patient reports of voice hoarseness, prod cough with Berberian sputum occ tinged with bright red blood, chest discomfort when coughing, temp of 99-100, wheezing and increased sob with exertion and rest. Sx started 06/29/2021.  Blood was described as the size of a dime.  Negative home covid test 06/29/2021. Fully vaccinated against covid and flu.  She is taking Breo once daily. She has not used albuterol HFA or solution.  Prescribed 2.5L QHS. She has had to use oxygen throughout the days since being sick.  Spo2 94% on roomair yesterday.  Patient is requesting CXR.   [name], please advise. Thanks

## 2021-07-04 ENCOUNTER — Ambulatory Visit: Payer: Federal, State, Local not specified - PPO | Admitting: Primary Care

## 2021-07-04 ENCOUNTER — Encounter: Payer: Self-pay | Admitting: Primary Care

## 2021-07-04 ENCOUNTER — Ambulatory Visit (INDEPENDENT_AMBULATORY_CARE_PROVIDER_SITE_OTHER): Payer: Federal, State, Local not specified - PPO

## 2021-07-04 ENCOUNTER — Telehealth: Payer: Self-pay | Admitting: Primary Care

## 2021-07-04 ENCOUNTER — Other Ambulatory Visit: Payer: Self-pay

## 2021-07-04 VITALS — BP 118/80 | HR 84 | Ht 68.0 in | Wt 280.2 lb

## 2021-07-04 DIAGNOSIS — R042 Hemoptysis: Secondary | ICD-10-CM | POA: Diagnosis not present

## 2021-07-04 DIAGNOSIS — J479 Bronchiectasis, uncomplicated: Secondary | ICD-10-CM

## 2021-07-04 DIAGNOSIS — J471 Bronchiectasis with (acute) exacerbation: Secondary | ICD-10-CM

## 2021-07-04 MED ORDER — HYDROCODONE BIT-HOMATROP MBR 5-1.5 MG/5ML PO SOLN: 5.0000 mL | Freq: Four times a day (QID) | ORAL | 0 refills | Status: DC | PRN

## 2021-07-04 MED ORDER — LEVOFLOXACIN 500 MG PO TABS: 500.0000 mg | ORAL_TABLET | Freq: Every day | ORAL | 0 refills | Status: DC

## 2021-07-04 NOTE — Telephone Encounter (Signed)
LMTCB  EW please advise if we can write letter to keep patient out until 12/5. If so what would you like letter to say. Thanks :)

## 2021-07-04 NOTE — Telephone Encounter (Signed)
Letter printed and placed on Ew desk for signature.

## 2021-07-04 NOTE — Progress Notes (Signed)
@Patient  ID: Jill Harper, female    DOB: 01-07-1966, 55 y.o.   MRN: [id number]  Chief Complaint  Patient presents with   Follow-up    Sick since 06/28/21, dark phlegm    Referring provider: Binnie Rail, MD  HPI: 55 year old female, never smoked.  Past medical history significant for bronchiectasis, ILD, return arthritis, obstructive sleep apnea, hypertension, mitral valve prolapse, fibromuscular dysplasia, fibromyalgia.  Patient [name], last seen in office on 02/01/2021.  07/04/2021 - interim hx  Patient presents today for a acute office visit due to hemoptysis. She developed cough with associated fatigue 5 days ago. Coughing became intense over the weekend and she developed low grade fever of 99-100, voice hoarseness and pleuritic pain. Cough is productive with dark golden yellow/[place] color with streaks of blood. She is not able to use flutter valve but is taking mucinex as directed. No respiratory distress.    Allergies  Allergen Reactions   Sulfonamide Derivatives     REACTION: hives    Immunization History  Administered Date(s) Administered   Influenza Split 04/10/2011, 06/18/2012, 05/06/2013   Influenza Whole 05/19/2008, 05/15/2009, 06/01/2010   Influenza,inj,Quad PF,6+ Mos 04/20/2014, 05/26/2015, 05/31/2016, 04/05/2017, 06/16/2018, 04/22/2019, 04/22/2020   Influenza-Unspecified 04/11/2021   PFIZER(Purple Top)SARS-COV-2 Vaccination 10/27/2019, 11/19/2019   Pneumococcal Conjugate-13 01/29/2017   Pneumococcal Polysaccharide-23 08/07/2007, 06/16/2018   Td 07/12/2009   Tdap 01/06/2020    Past Medical History:  Diagnosis Date   Abnormal liver function test    Bronchiectasis        Dyspnea    Fibromuscular dysplasia (HCC)    Fibromyalgia    Heart murmur    ILD (interstitial lung disease) ([place])    Joint pain    Menorrhagia    MIGRAINE HEADACHE    MVP (mitral valve prolapse)    Osteoarthritis    Polymyositis (HCC)    Dr [name]; chronic MTX    Pulmonary fibrosis (HCC)    Rheumatoid arthritis ([place])    Sleep apnea    Vitamin B 12 deficiency    Vitamin D deficiency     Tobacco History: Social History   Tobacco Use  Smoking Status Never  Smokeless Tobacco Never  Tobacco Comments   Married, lives with spouse. 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 Reported on 09/03/2015 1 Inhaler 4   albuterol (PROVENTIL) (2.5 MG/3ML) 0.083% nebulizer solution Take 3 mLs (2.5 mg total) by nebulization every 6 (six) hours as needed for wheezing or shortness of breath. 120 vial 5   aspirin EC 81 MG tablet Take 81 mg by mouth daily.     atenolol (TENORMIN) 25 MG tablet TAKE 1/2 TABLET BY MOUTH DAILY AS NEEDED FOR MITRAL PROLAPSE 45 tablet 0   azaTHIOprine (IMURAN) 50 MG tablet Take 2 tablets by mouth daily. 60 tablet 2   BREO ELLIPTA 100-25 MCG/INH AEPB INHALE 1 PUFF INTO THE LUNGS DAILY 60 each 5   Cholecalciferol (VITAMIN D) 50 MCG (2000 UT) CAPS Take by mouth.     diclofenac Sodium (VOLTAREN) 1 % GEL APPLY 2 TO 4 GRAMS TO AFFECTED JOINT FOUR TIMES DAILY AS NEEDED 400 g 2  eletriptan (RELPAX) 20 MG tablet TAKE 1 TABLET BY MOUTH AS NEEDED FOR MIGRAINE 10 tablet 11   ENBREL MINI 50 MG/ML SOCT INSERT 1 MINI CARTRIDGE INTO AUTOINJECTOR AND INJECT 50 MG UNDER THE SKIN EVERY 7 DAYS. 12 mL 0   folic acid (FOLVITE) 1 MG tablet TAKE 2 TABLETS(2 MG) BY MOUTH DAILY 180 tablet 3   gabapentin (NEURONTIN) 100 MG capsule Take 2 capsules (200 mg total) by mouth at bedtime. 60 capsule 5   Galcanezumab-gnlm (EMGALITY) 120 MG/ML SOAJ Inject 120 mg into the skin every 30 (thirty) days. 1.12 mL 5    guaiFENesin (MUCINEX) 600 MG 12 hr tablet Take 2 tablets (1,200 mg total) by mouth 2 (two) times daily as needed for cough or to loosen phlegm. (Patient taking differently: Take 1,200 mg by mouth 2 (two) times daily.)     metFORMIN (GLUCOPHAGE) 500 MG tablet TAKE 1 TABLET(500 MG) BY MOUTH TWICE DAILY WITH A MEAL 180 tablet 1   Multiple Vitamins-Minerals (MULTIVITAMIN GUMMIES ADULT PO) Take 2 each by mouth daily.     Respiratory Therapy Supplies (FLUTTER) DEVI Use as directed (Patient taking differently: as needed. Use as directed) 1 each 0   Rimegepant Sulfate (NURTEC) 75 MG TBDP Take 75 mg by mouth daily as needed. For migraines. Take as close to onset of migraine as possible. One daily maximum. 10 tablet 6   telmisartan (MICARDIS) 20 MG tablet TAKE 1 TABLET(20 MG) BY MOUTH DAILY 90 tablet 1   No facility-administered medications prior to visit.   Review of Systems  Review of Systems  Constitutional:  Positive for fatigue.  HENT:  Positive for congestion.   Respiratory:  Positive for cough.    Physical Exam  BP 118/80 (BP Location: Right Arm, Cuff Size: Normal)   Pulse 84   Ht 5\' 8"  (1.727 m)   Wt 280 lb 3.2 oz (127.1 kg)   SpO2 97%   BMI 42.60 kg/m  Physical Exam Constitutional:      General: She is not in acute distress.    Appearance: Normal appearance. She is ill-appearing. She is not toxic-appearing or diaphoretic.  HENT:     Mouth/Throat:     Mouth: Mucous membranes are moist.     Pharynx: Oropharynx is clear.  Cardiovascular:     Rate and Rhythm: Normal rate and regular rhythm.  Pulmonary:     Effort: Pulmonary effort is normal.     Breath sounds: Normal breath sounds. No wheezing, rhonchi or rales.     Comments: No overt rhonchi or rales. Reactive cough  Musculoskeletal:        General: Normal range of motion.  Skin:    General: Skin is warm and dry.  Neurological:     General: No focal deficit present.     Mental Status: She is alert and oriented to person,  place, and time. Mental status is at baseline.  Psychiatric:        Mood and Affect: Mood normal.        Behavior: Behavior normal.        Thought Content: Thought content normal.        Judgment: Judgment normal.     Lab Results:  CBC    Component Value Date/Time   WBC 7.5 05/10/2021 1437   RBC 4.85 05/10/2021 1437   HGB 13.8 05/10/2021 1437   HGB 14.2 10/12/2019 1432   HCT 41.4 05/10/2021 1437   HCT 41.9 10/12/2019 1432   PLT 313 05/10/2021 1437  PLT 292 10/12/2019 1432   MCV 85.4 05/10/2021 1437   MCV 86 10/12/2019 1432   MCH 28.5 05/10/2021 1437   MCHC 33.3 05/10/2021 1437   RDW 12.1 05/10/2021 1437   RDW 12.1 10/12/2019 1432   LYMPHSABS 1,650 05/10/2021 1437   LYMPHSABS 1.3 10/12/2019 1432   MONOABS 666 01/31/2017 1006   EOSABS 210 05/10/2021 1437   EOSABS 0.1 10/12/2019 1432   BASOSABS 30 05/10/2021 1437   BASOSABS 0.0 10/12/2019 1432    BMET    Component Value Date/Time   NA 141 05/10/2021 1437   NA 141 10/12/2019 1432   K 5.3 05/10/2021 1437   CL 105 05/10/2021 1437   CO2 27 05/10/2021 1437   GLUCOSE 92 05/10/2021 1437   BUN 11 05/10/2021 1437   BUN 11 10/12/2019 1432   CREATININE 0.61 05/10/2021 1437   CALCIUM 10.0 05/10/2021 1437   GFRNONAA 98 01/18/2021 1626   GFRAA 114 01/18/2021 1626    BNP No results found for: BNP  ProBNP    Component Value Date/Time   PROBNP 58.0 04/18/2009 1157    Imaging: DG Chest 2 View  Result Date: 07/04/2021 CLINICAL DATA:  55 year old female with hemoptysis. Interstitial lung disease, UIP by high-resolution chest CT. EXAM: CHEST - 2 VIEW COMPARISON:  Chest CT 03/01/2020 and earlier. FINDINGS: Lung volumes appear slightly decreased since 2020. Chronic but mildly increased diffuse pulmonary interstitial opacity, with a stable pattern to that in 2020. Mediastinal contours remain normal. Visualized tracheal air column is within normal limits. No pneumothorax, pleural effusion or confluent pulmonary opacity. No  acute osseous abnormality identified. Paucity of bowel gas in the upper abdomen. IMPRESSION: Chronic interstitial lung disease with mild radiographic progression since 2020. Electronically Signed   By: Genevie Ann M.D.   On: 07/04/2021 09:34     Assessment & Plan:   Bronchiectasis with (acute) exacerbation (HCC) - Acute exacerbation of bronchiectasis. Patient developed productive cough x 5 days with scant hemoptysis. Associated fatigue, low grade fever and pleuritic pain. CXR today showed chronic but mildly increased diffuse pulmonary interstitial opacity with a stable pattern to that in 2020. Sending in RX levaquin 500mg  qd x 7 days and hycodan 19ml q 6 hours for cough suppression. Resume flutter valve at lower setting. Avoid NSAIDS. Repeat CXR in 6 weeks. Advised patient to call if not better in 5-7 days or hemoptysis worsens.    Martyn Ehrich, NP 07/04/2021

## 2021-07-04 NOTE — Patient Instructions (Addendum)
CXR showed chronic interstitial lung disease with mild radiographic progression since 2020  Recommendations Hycodan 19ml every 6 hours for cough suppression  Avoid NSAIDs   RX: Levaquin 1 tablet daily x 7 days   Orders: CXR in 6 weeks   Follow-up: If symptoms do not improve or worsen

## 2021-07-04 NOTE — Telephone Encounter (Signed)
Sure, just keep it generic. Seen today by pulmonary office and recommend she be out of work until 07/10/21. If they need additional information please contact our office.

## 2021-07-04 NOTE — Progress Notes (Signed)
Reviewed and agree with assessment/plan.   Chesley Mires, MD [facility] Pulmonary/Critical Care 07/04/2021, 3:40 PM Pager:  [phone]

## 2021-07-04 NOTE — Assessment & Plan Note (Addendum)
-   Acute exacerbation of bronchiectasis. Patient developed productive cough x 5 days with scant hemoptysis. Associated fatigue, low grade fever and pleuritic pain. CXR today showed chronic but mildly increased diffuse pulmonary interstitial opacity with a stable pattern to that in 2020. Sending in RX levaquin 500mg  qd x 7 days and hycodan 44ml q 6 hours for cough suppression. Resume flutter valve at lower setting. Avoid NSAIDS. Repeat CXR in 6 weeks. Advised patient to call if not better in 5-7 days or hemoptysis worsens.

## 2021-07-05 ENCOUNTER — Other Ambulatory Visit: Payer: Federal, State, Local not specified - PPO

## 2021-07-05 DIAGNOSIS — J471 Bronchiectasis with (acute) exacerbation: Secondary | ICD-10-CM | POA: Diagnosis not present

## 2021-07-05 DIAGNOSIS — G4733 Obstructive sleep apnea (adult) (pediatric): Secondary | ICD-10-CM | POA: Diagnosis not present

## 2021-07-05 NOTE — Telephone Encounter (Signed)
I signed it and put it in front of the fax outbox, I can sign again

## 2021-07-05 NOTE — Telephone Encounter (Signed)
Spoke with Jill Harper upfront through [employer]. She states she does not see a letter upfront that has been signed. Can someone in [place] office print this off and have BW sign it again and place it up front for patient?

## 2021-07-05 NOTE — Telephone Encounter (Signed)
Beth did you sign the letter on this patient and give it to someone?

## 2021-07-06 NOTE — Telephone Encounter (Signed)
Letter has been reprinted. Will place on Beth's computer for her to sign tomorrow.

## 2021-07-07 NOTE — Telephone Encounter (Signed)
I called the patient and let her know the letter was ready for pick up. She reports that her husband was going to be picking up the letter. Nothing further needed.

## 2021-07-11 ENCOUNTER — Other Ambulatory Visit (INDEPENDENT_AMBULATORY_CARE_PROVIDER_SITE_OTHER): Payer: Federal, State, Local not specified - PPO

## 2021-07-11 ENCOUNTER — Encounter: Payer: Self-pay | Admitting: Primary Care

## 2021-07-11 ENCOUNTER — Ambulatory Visit: Payer: Federal, State, Local not specified - PPO | Admitting: Primary Care

## 2021-07-11 ENCOUNTER — Other Ambulatory Visit: Payer: Self-pay

## 2021-07-11 DIAGNOSIS — I341 Nonrheumatic mitral (valve) prolapse: Secondary | ICD-10-CM

## 2021-07-11 DIAGNOSIS — E88819 Insulin resistance, unspecified: Secondary | ICD-10-CM

## 2021-07-11 DIAGNOSIS — I10 Essential (primary) hypertension: Secondary | ICD-10-CM

## 2021-07-11 DIAGNOSIS — E559 Vitamin D deficiency, unspecified: Secondary | ICD-10-CM | POA: Diagnosis not present

## 2021-07-11 DIAGNOSIS — E8881 Metabolic syndrome: Secondary | ICD-10-CM

## 2021-07-11 DIAGNOSIS — J471 Bronchiectasis with (acute) exacerbation: Secondary | ICD-10-CM | POA: Diagnosis not present

## 2021-07-11 DIAGNOSIS — R202 Paresthesia of skin: Secondary | ICD-10-CM | POA: Diagnosis not present

## 2021-07-11 DIAGNOSIS — Z0000 Encounter for general adult medical examination without abnormal findings: Secondary | ICD-10-CM | POA: Diagnosis not present

## 2021-07-11 LAB — COMPREHENSIVE METABOLIC PANEL
ALT: 9 U/L (ref 0–35)
AST: 13 U/L (ref 0–37)
Albumin: 4.1 g/dL (ref 3.5–5.2)
Alkaline Phosphatase: 60 U/L (ref 39–117)
BUN: 9 mg/dL (ref 6–23)
CO2: 27 mEq/L (ref 19–32)
Calcium: 9.5 mg/dL (ref 8.4–10.5)
Chloride: 106 mEq/L (ref 96–112)
Creatinine, Ser: 0.67 mg/dL (ref 0.40–1.20)
GFR: 98.22 mL/min (ref >60.00)
Glucose, Bld: 108 mg/dL — ABNORMAL HIGH (ref 70–99)
Potassium: 4 mEq/L (ref 3.5–5.1)
Sodium: 140 mEq/L (ref 135–145)
Total Bilirubin: 0.5 mg/dL (ref 0.2–1.2)
Total Protein: 7.6 g/dL (ref 6.0–8.3)

## 2021-07-11 LAB — CBC WITH DIFFERENTIAL/PLATELET
Basophils Absolute: 0 10*3/uL (ref 0.0–0.1)
Basophils Relative: 0.5 % (ref 0.0–3.0)
Eosinophils Absolute: 0.2 10*3/uL (ref 0.0–0.7)
Eosinophils Relative: 1.8 % (ref 0.0–5.0)
HCT: 39.5 % (ref 36.0–46.0)
Hemoglobin: 13 g/dL (ref 12.0–15.0)
Lymphocytes Relative: 16.2 % (ref 12.0–46.0)
Lymphs Abs: 1.4 10*3/uL (ref 0.7–4.0)
MCHC: 32.9 g/dL (ref 30.0–36.0)
MCV: 84.8 fl (ref 78.0–100.0)
Monocytes Absolute: 0.7 10*3/uL (ref 0.1–1.0)
Monocytes Relative: 8.3 % (ref 3.0–12.0)
Neutro Abs: 6.3 10*3/uL (ref 1.4–7.7)
Neutrophils Relative %: 73.2 % (ref 43.0–77.0)
Platelets: 259 10*3/uL (ref 150.0–400.0)
RBC: 4.66 Mil/uL (ref 3.87–5.11)
RDW: 13.3 % (ref 11.5–15.5)
WBC: 8.6 10*3/uL (ref 4.0–10.5)

## 2021-07-11 LAB — LIPID PANEL
Cholesterol: 129 mg/dL (ref 0–200)
HDL: 39.6 mg/dL (ref >39.00)
LDL Cholesterol: 74 mg/dL (ref 0–99)
NonHDL: 89.21
Total CHOL/HDL Ratio: 3
Triglycerides: 78 mg/dL (ref 0.0–149.0)
VLDL: 15.6 mg/dL (ref 0.0–40.0)

## 2021-07-11 LAB — VITAMIN D 25 HYDROXY (VIT D DEFICIENCY, FRACTURES): VITD: 15.31 ng/mL — ABNORMAL LOW (ref 30.00–100.00)

## 2021-07-11 LAB — VITAMIN B12: Vitamin B-12: 921 pg/mL — ABNORMAL HIGH (ref 211–911)

## 2021-07-11 LAB — HEMOGLOBIN A1C: Hgb A1c MFr Bld: 5.9 % (ref 4.6–6.5)

## 2021-07-11 LAB — TSH: TSH: 6.06 u[IU]/mL — ABNORMAL HIGH (ref 0.35–5.50)

## 2021-07-11 MED ORDER — LEVOFLOXACIN 500 MG PO TABS: 500.0000 mg | ORAL_TABLET | Freq: Every day | ORAL | 0 refills | Status: DC

## 2021-07-11 MED ORDER — PREDNISONE 10 MG PO TABS: ORAL_TABLET | ORAL | 0 refills | Status: DC

## 2021-07-11 NOTE — Progress Notes (Signed)
@Patient  ID: Jill Harper, female    DOB: May 15, 1966, 55 y.o.   MRN: [id number]  Chief Complaint  Patient presents with   Follow-up    Patient says things are improving with the antibiotic but she's still dealing with the fatigue and mucus from her cough.    Referring provider: Binnie Rail, MD  HPI: 55 year old female, never smoked.  Past medical history significant for bronchiectasis, ILD, return arthritis, obstructive sleep apnea, hypertension, mitral valve prolapse, fibromuscular dysplasia, fibromyalgia.  Patient [name], last seen on 07/04/21 for acute exacerbation bronchiectasis.   Previous [facility] pulmonary encounter:  07/04/2021  Patient presents today for a acute office visit due to hemoptysis. She developed cough with associated fatigue 5 days ago. Coughing became intense over the weekend and she developed low grade fever of 99-100, voice hoarseness and pleuritic pain. Cough is productive with dark golden yellow/[place] color with streaks of blood. She is not able to use flutter valve but is taking mucinex as directed. No respiratory distress.   07/11/2021- Interim hx  Patient presents today for 1 week follow-up. During last visit we treated her for bronchiectasis exacerbation with course of Levaquin x 7 days and hycodan cough syrup. CXR showed chronic interstitial lung disease with mild radiographic progression since 2020.   She is feeling a good deal better but not 100%. She still has cough with purulent mucus but this has improved. She has associated sob and fatigue. Voice hoarseness has improved. She has not been able to use her flutter valve d/t amount of congestion and an inner ear issue. She is taking hycodan at night only. She is currently out of work until next week.    Allergies  Allergen Reactions   Sulfonamide Derivatives     REACTION: hives    Immunization History  Administered Date(s) Administered   Influenza Split 04/10/2011, 06/18/2012, 05/06/2013   Influenza  Whole 05/19/2008, 05/15/2009, 06/01/2010   Influenza,inj,Quad PF,6+ Mos 04/20/2014, 05/26/2015, 05/31/2016, 04/05/2017, 06/16/2018, 04/22/2019, 04/22/2020   Influenza-Unspecified 04/11/2021   PFIZER(Purple Top)SARS-COV-2 Vaccination 10/27/2019, 11/19/2019   Pneumococcal Conjugate-13 01/29/2017   Pneumococcal Polysaccharide-23 08/07/2007, 06/16/2018   Td 07/12/2009   Tdap 01/06/2020    Past Medical History:  Diagnosis Date   Abnormal liver function test    Bronchiectasis        Dyspnea    Fibromuscular dysplasia (HCC)    Fibromyalgia    Heart murmur    ILD (interstitial lung disease) ([place])    Joint pain    Menorrhagia    MIGRAINE HEADACHE    MVP (mitral valve prolapse)    Osteoarthritis    Polymyositis (HCC)    Dr [name]; chronic MTX   Pulmonary fibrosis (HCC)    Rheumatoid arthritis ([place])    Sleep apnea    Vitamin B 12 deficiency    Vitamin D deficiency     Tobacco History: Social History   Tobacco Use  Smoking Status Never  Smokeless Tobacco Never  Tobacco Comments   Married, lives with spouse. 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 Reported on 09/03/2015 1 Inhaler 4   albuterol (PROVENTIL) (2.5 MG/3ML) 0.083%  nebulizer solution Take 3 mLs (2.5 mg total) by nebulization every 6 (six) hours as needed for wheezing or shortness of breath. 120 vial 5   aspirin EC 81 MG tablet Take 81 mg by mouth daily.     atenolol (TENORMIN) 25 MG tablet TAKE 1/2 TABLET BY MOUTH DAILY AS NEEDED FOR MITRAL PROLAPSE 45 tablet 0   azaTHIOprine (IMURAN) 50 MG tablet Take 2 tablets by mouth daily. 60 tablet 2   BREO  ELLIPTA 100-25 MCG/INH AEPB INHALE 1 PUFF INTO THE LUNGS DAILY 60 each 5   Cholecalciferol (VITAMIN D) 50 MCG (2000 UT) CAPS Take by mouth.     diclofenac Sodium (VOLTAREN) 1 % GEL APPLY 2 TO 4 GRAMS TO AFFECTED JOINT FOUR TIMES DAILY AS NEEDED 400 g 2   eletriptan (RELPAX) 20 MG tablet TAKE 1 TABLET BY MOUTH AS NEEDED FOR MIGRAINE 10 tablet 11   ENBREL MINI 50 MG/ML SOCT INSERT 1 MINI CARTRIDGE INTO AUTOINJECTOR AND INJECT 50 MG UNDER THE SKIN EVERY 7 DAYS. 12 mL 0   folic acid (FOLVITE) 1 MG tablet TAKE 2 TABLETS(2 MG) BY MOUTH DAILY 180 tablet 3   gabapentin (NEURONTIN) 100 MG capsule Take 2 capsules (200 mg total) by mouth at bedtime. 60 capsule 5   Galcanezumab-gnlm (EMGALITY) 120 MG/ML SOAJ Inject 120 mg into the skin every 30 (thirty) days. 1.12 mL 5   guaiFENesin (MUCINEX) 600 MG 12 hr tablet Take 2 tablets (1,200 mg total) by mouth 2 (two) times daily as needed for cough or to loosen phlegm. (Patient taking differently: Take 1,200 mg by mouth 2 (two) times daily.)     HYDROcodone bit-homatropine (HYCODAN) 5-1.5 MG/5ML syrup Take 5 mLs by mouth every 6 (six) hours as needed for cough. 240 mL 0   metFORMIN (GLUCOPHAGE) 500 MG tablet TAKE 1 TABLET(500 MG) BY MOUTH TWICE DAILY WITH A MEAL 180 tablet 1   Multiple Vitamins-Minerals (MULTIVITAMIN GUMMIES ADULT PO) Take 2 each by mouth daily.     Respiratory Therapy Supplies (FLUTTER) DEVI Use as directed (Patient taking differently: as needed. Use as directed) 1 each 0   Rimegepant Sulfate (NURTEC) 75 MG TBDP Take 75 mg by mouth daily as needed. For migraines. Take as close to onset of migraine as possible. One daily maximum. 10 tablet 6   telmisartan (MICARDIS) 20 MG tablet TAKE 1 TABLET(20 MG) BY MOUTH DAILY 90 tablet 1   levofloxacin (LEVAQUIN) 500 MG tablet Take 1 tablet (500 mg total) by mouth daily. 7 tablet 0   No facility-administered medications prior to visit.   Review of Systems  Review of Systems  Constitutional:  Positive for  fatigue. Negative for fever.  HENT:  Positive for congestion.   Respiratory:  Positive for cough and shortness of breath. Negative for chest tightness and wheezing.     Physical Exam  BP 124/80 (BP Location: Left Arm, Patient Position: Sitting, Cuff Size: Large)   Pulse 75   Temp 98 F (36.7 C) (Oral)   Ht 5\' 8"  (1.727 m)   Wt 281 lb 12.8 oz (127.8 kg)   SpO2 96%   BMI 42.85 kg/m  Physical Exam Constitutional:      General: She is not in acute distress.    Appearance: Normal appearance. She is not ill-appearing or toxic-appearing.  HENT:     Head: Normocephalic and atraumatic.     Mouth/Throat:     Mouth: Mucous membranes are moist.     Pharynx: Oropharynx is clear.  Cardiovascular:  Rate and Rhythm: Normal rate and regular rhythm.  Pulmonary:     Effort: Pulmonary effort is normal.     Breath sounds: Normal breath sounds. No wheezing, rhonchi or rales.     Comments: Reactive cough Musculoskeletal:        General: Normal range of motion.     Cervical back: Normal range of motion and neck supple.  Skin:    General: Skin is warm and dry.  Neurological:     General: No focal deficit present.     Mental Status: She is alert and oriented to person, place, and time. Mental status is at baseline.  Psychiatric:        Mood and Affect: Mood normal.        Behavior: Behavior normal.        Thought Content: Thought content normal.        Judgment: Judgment normal.     Lab Results:  CBC    Component Value Date/Time   WBC 7.5 05/10/2021 1437   RBC 4.85 05/10/2021 1437   HGB 13.8 05/10/2021 1437   HGB 14.2 10/12/2019 1432   HCT 41.4 05/10/2021 1437   HCT 41.9 10/12/2019 1432   PLT 313 05/10/2021 1437   PLT 292 10/12/2019 1432   MCV 85.4 05/10/2021 1437   MCV 86 10/12/2019 1432   MCH 28.5 05/10/2021 1437   MCHC 33.3 05/10/2021 1437   RDW 12.1 05/10/2021 1437   RDW 12.1 10/12/2019 1432   LYMPHSABS 1,650 05/10/2021 1437   LYMPHSABS 1.3 10/12/2019 1432   MONOABS  666 01/31/2017 1006   EOSABS 210 05/10/2021 1437   EOSABS 0.1 10/12/2019 1432   BASOSABS 30 05/10/2021 1437   BASOSABS 0.0 10/12/2019 1432    BMET    Component Value Date/Time   NA 141 05/10/2021 1437   NA 141 10/12/2019 1432   K 5.3 05/10/2021 1437   CL 105 05/10/2021 1437   CO2 27 05/10/2021 1437   GLUCOSE 92 05/10/2021 1437   BUN 11 05/10/2021 1437   BUN 11 10/12/2019 1432   CREATININE 0.61 05/10/2021 1437   CALCIUM 10.0 05/10/2021 1437   GFRNONAA 98 01/18/2021 1626   GFRAA 114 01/18/2021 1626    BNP No results found for: BNP  ProBNP    Component Value Date/Time   PROBNP 58.0 04/18/2009 1157    Imaging: DG Chest 2 View  Result Date: 07/04/2021 CLINICAL DATA:  55 year old female with hemoptysis. Interstitial lung disease, UIP by high-resolution chest CT. EXAM: CHEST - 2 VIEW COMPARISON:  Chest CT 03/01/2020 and earlier. FINDINGS: Lung volumes appear slightly decreased since 2020. Chronic but mildly increased diffuse pulmonary interstitial opacity, with a stable pattern to that in 2020. Mediastinal contours remain normal. Visualized tracheal air column is within normal limits. No pneumothorax, pleural effusion or confluent pulmonary opacity. No acute osseous abnormality identified. Paucity of bowel gas in the upper abdomen. IMPRESSION: Chronic interstitial lung disease with mild radiographic progression since 2020. Electronically Signed   By: Genevie Ann M.D.   On: 07/04/2021 09:34     Assessment & Plan:   Bronchiectasis with (acute) exacerbation (HCC) - Partial improvement. Continues to have productive cough with purulent mucus. Associated fatigue and shortness of breath. Extending Levaquin 500mg  additional 3 days. Sending in prednisone 20mg  x 5-7 days. Recommend she start taking Robitussin-DM 64ml q 4 hours as needed for cough (she can take hycodan at bedtime to help her sleep). Advised she use albuterol nebulizer every 6 hours and flutter valve  on lowest setting. She will  call us if not doing better. Needs repeat CXR upon return. FU in 1 month or sooner if needed.    Martyn Ehrich, NP 07/11/2021

## 2021-07-11 NOTE — Patient Instructions (Addendum)
Recommendations: Extending levaquin additional 3 days  Prednisone 20mg  x 5-7 days (I gave you enough for 1 week) Take Robitussin-DM 33ml every 4 hours for cough Use albuterol nebulizer every 6 hours for the next week  Use flutter valve on lowest setting   Orders: Letter to return to work on 12/13  Follow-up: 1-2 months with [name] or Eustaquio Maize NP

## 2021-07-11 NOTE — Progress Notes (Signed)
Reviewed and agree with assessment/plan.   Chesley Mires, MD [facility] Pulmonary/Critical Care 07/11/2021, 10:45 AM Pager:  [phone]

## 2021-07-11 NOTE — Assessment & Plan Note (Addendum)
-   Partial improvement. Continues to have productive cough with purulent mucus. Associated fatigue and shortness of breath. Extending Levaquin 500mg  additional 3 days. Sending in prednisone 20mg  x 5-7 days. Recommend she start taking Robitussin-DM 71ml q 4 hours as needed for cough (she can take hycodan at bedtime to help her sleep). Advised she use albuterol nebulizer every 6 hours and flutter valve on lowest setting. She will call us if not doing better. Needs repeat CXR upon return. FU in 1 month or sooner if needed.

## 2021-07-20 NOTE — Progress Notes (Signed)
° °Office Visit Note ° °Patient: Jill Harper             °Date of Birth: 01/06/1966           °MRN: [id number]             °PCP: Burns, Stacy J, MD °Referring: Burns, Stacy J, MD °Visit Date: 08/03/2021 °Occupation: @GUAROCC@ ° °Subjective:  °Medication monitoring  ° °History of Present Illness: Jill Harper is a 55 y.o. female with history of seropositive rheumatoid arthritis, polymyositis,  ILD, and osteoarthritis.  She is currently on Enbrel 50 mg subcutaneous injections once weekly and Imuran 100 mg daily.  She is tolerating these medications without any side effects.  She was diagnosed with an upper respiratory infection at the end of November 2022.  At that time she was treated with 10 days of Levaquin as well as a prednisone taper.  She was told that the chest x-ray revealed possible pneumonia superimposed on chronic ILD changes.  She was also felt to have had a bronchiectasis exacerbation.  She had updated PFTs on 07/27/2021 and will be going for an updated chest x-ray soon.  She held Enbrel and Imuran for about 10 days until the infection had completely cleared.  She states her energy level has gradually been improving.  She denies any recent rheumatoid arthritis or polymyositis flares.  She has some stiffness in both hands and a little bit of warmth in the right thumb but denies any other joint swelling.  She experiences some generalized myalgias and muscle fatigue intermittently especially after the recent infection. ° ° °Activities of Daily Living:  °Patient reports morning stiffness for 5 minutes.   °Patient Denies nocturnal pain.  °Difficulty dressing/grooming: Denies °Difficulty climbing stairs: Reports °Difficulty getting out of chair: Denies °Difficulty using hands for taps, buttons, cutlery, and/or writing: Reports ° °Review of Systems  °Constitutional:  Positive for fatigue.  °HENT:  Negative for mouth sores, mouth dryness and nose dryness.   °Eyes:  Negative for pain, itching and dryness.   °Respiratory:  Positive for shortness of breath and difficulty breathing.   °Cardiovascular:  Negative for chest pain and palpitations.  °Gastrointestinal:  Negative for blood in stool, constipation and diarrhea.  °Endocrine: Negative for increased urination.  °Genitourinary:  Negative for difficulty urinating.  °Musculoskeletal:  Positive for joint pain, joint pain, joint swelling and morning stiffness. Negative for myalgias, muscle tenderness and myalgias.  °Skin:  Negative for color change, rash and redness.  °Allergic/Immunologic: Positive for susceptible to infections.  °Neurological:  Positive for numbness, headaches and weakness. Negative for dizziness and memory loss.  °Hematological:  Positive for bruising/bleeding tendency.  °Psychiatric/Behavioral:  Negative for confusion.   ° °PMFS History:  °Patient Active Problem List  ° Diagnosis Date Noted  ° Tingling 06/28/2021  ° Occipital neuralgia of left side 01/06/2020  ° Bilateral carpal tunnel syndrome 07/21/2019  ° Costochondritis 03/16/2019  ° Rib pain on right side 03/16/2019  ° Insulin resistance 11/27/2018  ° Fibromuscular dysplasia (HCC) 10/17/2018  ° Hypertension 10/17/2018  ° Bronchiectasis with (acute) exacerbation (HCC) 10/08/2018  ° OSA (obstructive sleep apnea) 08/20/2018  ° Rheumatoid arthritis (HCC) 04/17/2018  ° ILD (interstitial lung disease) (HCC) 04/17/2018  ° Dyspnea 04/01/2018  ° Bronchiectasis (HCC) 09/02/2017  ° LVH (left ventricular hypertrophy), mild 06/02/2017  ° Mitral regurgitation 05/22/2017  ° Jo-1 antibody positive 01/29/2017  ° Primary osteoarthritis of both feet 01/29/2017  ° Primary osteoarthritis of both hands 01/29/2017  ° Left shoulder tendonitis   tendonitis 01/17/2017   Vitamin D deficiency 04/26/2016   Cough 07/18/2015   MVP (mitral valve prolapse)    Erythema nodosum    INCONTINENCE, URGE 05/26/2010   Migraine headache 02/15/2010   Fibromyalgia 06/20/2009   Polymyositis ([place]) 02/11/2008   BRONCHIECTASIS 02/10/2008     Past Medical History:  Diagnosis Date   Abnormal liver function test    Bronchiectasis        Dyspnea    Fibromuscular dysplasia (HCC)    Fibromyalgia    Heart murmur    ILD (interstitial lung disease) (HCC)    Joint pain    Menorrhagia    MIGRAINE HEADACHE    MVP (mitral valve prolapse)    Osteoarthritis    Polymyositis (HCC)    Dr [name]; chronic MTX   Pulmonary fibrosis ([place])    Rheumatoid arthritis ([place])    Sleep apnea    Vitamin B 12 deficiency    Vitamin D deficiency     Family History  Problem Relation Age of Onset   Diabetes Mother    Fibromyalgia Mother    Ulcers Mother    Heart failure Mother    Thyroid disease Mother    Obesity Mother    Multiple myeloma Father    Hypertension Father    Stroke Father    Lupus Sister    Other Sister        abdominal adhesions resulting in bowel obstruction   Migraines Sister    Heart disease Brother        bypass surgery   Rheum arthritis Sister    Multiple myeloma Sister    Hypertension Sister    Heart attack Sister    Diabetes Sister    Hypertension Sister    Rheum arthritis Sister    Diabetes Sister    Diabetes Brother    Headache Other        siblings with headaches but not diagnosed as migraines   Past Surgical History:  Procedure Laterality Date   ablation uterine  2010   CARDIAC CATHETERIZATION  2002   normal   DILATION AND CURETTAGE OF UTERUS  08-31-08   Dr [name]   IR [place] SEL COM CAROTID INNOMINATE BILAT MOD SED  09/18/2018   IR ANGIO VERTEBRAL SEL VERTEBRAL BILAT MOD SED  09/18/2018   IR US GUIDE VASC ACCESS RIGHT  09/18/2018   Social History   Social History Narrative   Exercise: trying to walk - limited by fatigue   Lives at home with husband    Right handed   Caffeine: none    Immunization History  Administered Date(s) Administered   Influenza Split 04/10/2011, 06/18/2012, 05/06/2013   Influenza Whole 05/19/2008, 05/15/2009, 06/01/2010    Influenza,inj,Quad PF,6+ Mos 04/20/2014, 05/26/2015, 05/31/2016, 04/05/2017, 06/16/2018, 04/22/2019, 04/22/2020   Influenza-Unspecified 04/11/2021   PFIZER(Purple Top)SARS-COV-2 Vaccination 10/27/2019, 11/19/2019   Pneumococcal Conjugate-13 01/29/2017   Pneumococcal Polysaccharide-23 08/07/2007, 06/16/2018   Td 07/12/2009   Tdap 01/06/2020     Objective: Vital Signs: BP 111/71 (BP Location: Left Arm, Patient Position: Sitting, Cuff Size: Large)    Pulse 71    Ht 5' 8" (1.727 m)    Wt 280 lb (127 kg)    BMI 42.57 kg/m    Physical Exam Vitals and nursing note reviewed.  Constitutional:      Appearance: She is well-developed.  HENT:     Head: Normocephalic and atraumatic.  Eyes:     Conjunctiva/sclera: Conjunctivae normal.  Pulmonary:  Effort: Pulmonary effort is normal.  °Abdominal:  °   Palpations: Abdomen is soft.  °Musculoskeletal:  °   Cervical back: Normal range of motion.  °Skin: °   General: Skin is warm and dry.  °   Capillary Refill: Capillary refill takes less than 2 seconds.  °Neurological:  °   Mental Status: She is alert and oriented to person, place, and time.  °Psychiatric:     °   Behavior: Behavior normal.  °  ° °Musculoskeletal Exam: C-spine, thoracic spine, lumbar spine have good range of motion with no discomfort.  Shoulder joints, elbow joints, wrist joints, MCPs, PIPs, DIPs have good range of motion with no synovitis.  Hip joints, knee joints, and ankle joints have good range of motion with no discomfort.  No warmth or effusion of knee joints noted.  No tenderness or swelling of ankle joints. ° °CDAI Exam: °CDAI Score: 0.9  °Patient Global: 5 mm; Provider Global: 4 mm °Swollen: 0 ; Tender: 0  °Joint Exam 08/03/2021  ° °No joint exam has been documented for this visit  ° °There is currently no information documented on the homunculus. Go to the Rheumatology activity and complete the homunculus joint exam. ° °Investigation: °No additional findings. ° °Imaging: °No results  found. ° °Recent Labs: °Lab Results  °Component Value Date  ° WBC 8.6 07/11/2021  ° HGB 13.0 07/11/2021  ° PLT 259.0 07/11/2021  ° NA 140 07/11/2021  ° K 4.0 07/11/2021  ° CL 106 07/11/2021  ° CO2 27 07/11/2021  ° GLUCOSE 108 (H) 07/11/2021  ° BUN 9 07/11/2021  ° CREATININE 0.67 07/11/2021  ° BILITOT 0.5 07/11/2021  ° ALKPHOS 60 07/11/2021  ° AST 13 07/11/2021  ° ALT 9 07/11/2021  ° PROT 7.6 07/11/2021  ° ALBUMIN 4.1 07/11/2021  ° CALCIUM 9.5 07/11/2021  ° GFRAA 114 01/18/2021  ° QFTBGOLDPLUS NEGATIVE 01/18/2021  ° ° °Speciality Comments: Prior therapy includes: MTX (d/c due to ILD progresssion) ° °Procedures:  °No procedures performed °Allergies: Sulfonamide derivatives  ° °Assessment / Plan:     °Visit Diagnoses: Rheumatoid arthritis involving multiple sites with positive rheumatoid factor (HCC) - +RF, +CCP: She has no synovitis on examination today.  She has persistent tenderness of the right first MCP joint.  Overall she has clinically been doing well on Enbrel 50 mg subcutaneous injections once weekly and Imuran 100 mg daily.  She has been tolerating these medications without any side effects.  She was recently diagnosed with an upper respiratory infection followed by a bronchiectasis exacerbation at the end of November 2022.  She was treated with a 10-day course of Levaquin at which time she held Enbrel and Imuran until the infection had completely cleared.  She noticed an increase stiffness in both hands during that time but did not have a rheumatoid arthritis flare.  She will remain on the current treatment regimen.  No medication changes will be made at this time.  A refill of Imuran was sent to the pharmacy.  She was advised to notify us if she develops increased joint pain or joint swelling.  She will follow-up in the office in 5 months. ° °Polymyositis (HCC) - Dx by Dr. Truslow 2007, CK 801, Jo-1 positive: She has not had any signs or symptoms of a polymyositis flare.  She had full strength of upper and  lower extremities on examination today.  No difficulty rising from a seated position or raising her arms above her head.  She is clinically doing   well taking Imuran as prescribed.  CK was 57 on 01/18/2021.  She is overdue to update CK.  Order was released.  She will continue to require CK every 3 months.  Standing orders for CK was placed today.  She was advised to notify us if she develops signs or symptoms of a flare.  No medication changes will be made at this time.- Plan: CK, CK  High risk medication use - Enbrel 50 mg sq injections once weekly (02/18/20-1st injection). Imuran 100 mg by mouth daily. d/c MTX due to progressioin of ILD. CBC (WNL) and CMP were drawn on 07/11/2021.  Her next lab work will be due when March and every 3 months to monitor for drug toxicity.  TB Gold negative on 01/18/2021. She has not had any recent infections.  Discussed the importance of holding Enbrel and Imuran if she develops signs or symptoms of an infection and to resume once the infection has completely cleared. Discussed the importance of yearly skin examinations while on Enbrel due to the increased risk for nonmelanoma skin cancer. A referral to dermatology will be placed today.    Jo-1 antibody positive  ILD (interstitial lung disease) ([place]) - Examined by [name] on 05/25/20.  PFT and 6-minute walk test updated on 03/22/20. High resolution CT on 03/01/20.  Chest x-ray updated on 07/04/2021.  She will be going back for repeat chest x-ray next week.  PFTs updated on 07/27/2021.  Bronchiectasis without complication ([place]): Followed by [facility]-[name].  She had a recent bronchiectasis exacerbation requiring close follow-up.  She was treated with Levaquin for 10 days as well as a prednisone taper.  Her symptoms have completely resolved.  She had updated PFTs on 07/27/2021 and was supposed to have an updated chest x-ray at that time but did not have it performed.  She plans on going back to have the chest x-ray  for further evaluation.  Bilateral carpal tunnel syndrome: Asymptomatic at this time.  Primary osteoarthritis of both hands: She has PIP and DIP thickening consistent with osteoarthritis of both hands.  Primary osteoarthritis of both feet: She is not experiencing any discomfort in her feet at this time.  She has good range of motion of both ankle joints with no tenderness or joint swelling.  No tenderness over MTP joints.  Fibromyalgia: She experiences intermittent myalgias and muscle tenderness due to underlying fibromyalgia.  Her symptoms are typically exacerbated by colder weather temperatures or when her body is under stress.  Discussed the importance of regular exercise, good sleep hygiene, and stress management.  Other fatigue: Chronic.  She had a recent exacerbation of her daytime fatigue after being diagnosed with an URI respiratory infection at the end of November 2022.  Discussed the importance of regular exercise and good sleep hygiene.  Raynaud's syndrome without gangrene: She has been experiencing more frequent symptoms of raynaud's with cooler weather temperatures.  Discussed the importance of avoiding triggers including exposure to cold temperatures, tobacco smoke, and extreme emotional stress.  I also discussed the importance of keeping her core body temperature warm as well as wearing gloves and thick socks.  No digital ulcerations or signs of gangrene were noted on examination today.  No signs of sclerodactyly noted.  Good capillary refill noted.  She was advised to notify us if she develops any new or worsening symptoms.  She will continue aspirin 81 mg daily.  Discussed that if her symptoms persist or worsen she may need to talk with Dr. Quay Burow about  switching from a beta-blocker to a calcium channel blocker in the future.  ° °Other medical conditions are listed as follows: ° °History of mitral valve prolapse ° °Fibromuscular dysplasia (HCC) ° °Vitamin D deficiency -Vitamin D was 15.31  on 07/11/2021.  She has resumed a vitamin D supplement 2000 units daily.  Future order for vitamin D will be placed today to be drawn with her next lab work in 3 months.  Plan: VITAMIN D 25 Hydroxy (Vit-D Deficiency, Fractures) ° °History of migraine - She was evaluated by Dr. Ahern on 09/22/20 who recommended increasing the frequency of ajovy injections to every 3 weeks and to try nurtec.  ° °Erythema nodosum: No recurrence ° °Orders: °Orders Placed This Encounter  °Procedures  ° CK  ° CK  ° VITAMIN D 25 Hydroxy (Vit-D Deficiency, Fractures)  ° °Meds ordered this encounter  °Medications  ° azaTHIOprine (IMURAN) 50 MG tablet  °  Sig: Take 2 tablets by mouth daily.  °  Dispense:  60 tablet  °  Refill:  2  ° ° ° ° °Follow-Up Instructions: Return in about 5 months (around 01/01/2022) for Rheumatoid arthritis, Polymyositis, Osteoarthritis, ILD. ° ° ° M , PA-C ° °Note - This record has been created using Dragon software.  °Chart creation errors have been sought, but may not always  °have been located. Such creation errors do not reflect on  °the standard of medical care.  °

## 2021-07-26 ENCOUNTER — Other Ambulatory Visit: Payer: Self-pay | Admitting: *Deleted

## 2021-07-26 DIAGNOSIS — J479 Bronchiectasis, uncomplicated: Secondary | ICD-10-CM

## 2021-07-27 ENCOUNTER — Ambulatory Visit (INDEPENDENT_AMBULATORY_CARE_PROVIDER_SITE_OTHER): Payer: Federal, State, Local not specified - PPO | Admitting: Pulmonary Disease

## 2021-07-27 ENCOUNTER — Other Ambulatory Visit: Payer: Self-pay

## 2021-07-27 DIAGNOSIS — J479 Bronchiectasis, uncomplicated: Secondary | ICD-10-CM | POA: Diagnosis not present

## 2021-07-27 LAB — PULMONARY FUNCTION TEST
DL/VA % pred: 120 %
DL/VA: 4.98 ml/min/mmHg/L
DLCO cor % pred: 80 %
DLCO cor: 18.89 ml/min/mmHg
DLCO unc % pred: 80 %
DLCO unc: 18.89 ml/min/mmHg
FEF 25-75 Post: 3.66 L/sec
FEF 25-75 Pre: 3.6 L/sec
FEF2575-%Change-Post: 1 %
FEF2575-%Pred-Post: 143 %
FEF2575-%Pred-Pre: 140 %
FEV1-%Change-Post: 4 %
FEV1-%Pred-Post: 93 %
FEV1-%Pred-Pre: 88 %
FEV1-Post: 2.41 L
FEV1-Pre: 2.29 L
FEV1FVC-%Change-Post: 0 %
FEV1FVC-%Pred-Pre: 111 %
FEV6-%Change-Post: 5 %
FEV6-%Pred-Post: 85 %
FEV6-%Pred-Pre: 80 %
FEV6-Post: 2.69 L
FEV6-Pre: 2.55 L
FEV6FVC-%Pred-Post: 102 %
FEV6FVC-%Pred-Pre: 102 %
FVC-%Change-Post: 5 %
FVC-%Pred-Post: 82 %
FVC-%Pred-Pre: 78 %
FVC-Post: 2.69 L
FVC-Pre: 2.55 L
Post FEV1/FVC ratio: 89 %
Post FEV6/FVC ratio: 100 %
Pre FEV1/FVC ratio: 90 %
Pre FEV6/FVC Ratio: 100 %
RV % pred: 66 %
RV: 1.39 L
TLC % pred: 71 %
TLC: 4.04 L

## 2021-07-27 NOTE — Patient Instructions (Signed)
Full PFT performed today. °

## 2021-07-27 NOTE — Progress Notes (Signed)
Full PFT performed today. °

## 2021-07-28 DIAGNOSIS — G4733 Obstructive sleep apnea (adult) (pediatric): Secondary | ICD-10-CM | POA: Diagnosis not present

## 2021-07-28 DIAGNOSIS — J471 Bronchiectasis with (acute) exacerbation: Secondary | ICD-10-CM | POA: Diagnosis not present

## 2021-08-03 ENCOUNTER — Encounter: Payer: Self-pay | Admitting: Physician Assistant

## 2021-08-03 ENCOUNTER — Other Ambulatory Visit: Payer: Self-pay

## 2021-08-03 ENCOUNTER — Ambulatory Visit: Payer: Federal, State, Local not specified - PPO | Admitting: Physician Assistant

## 2021-08-03 VITALS — BP 111/71 | HR 71 | Ht 68.0 in | Wt 280.0 lb

## 2021-08-03 DIAGNOSIS — G5603 Carpal tunnel syndrome, bilateral upper limbs: Secondary | ICD-10-CM

## 2021-08-03 DIAGNOSIS — R768 Other specified abnormal immunological findings in serum: Secondary | ICD-10-CM | POA: Diagnosis not present

## 2021-08-03 DIAGNOSIS — M3320 Polymyositis, organ involvement unspecified: Secondary | ICD-10-CM

## 2021-08-03 DIAGNOSIS — J479 Bronchiectasis, uncomplicated: Secondary | ICD-10-CM

## 2021-08-03 DIAGNOSIS — Z8679 Personal history of other diseases of the circulatory system: Secondary | ICD-10-CM

## 2021-08-03 DIAGNOSIS — M19042 Primary osteoarthritis, left hand: Secondary | ICD-10-CM

## 2021-08-03 DIAGNOSIS — M19041 Primary osteoarthritis, right hand: Secondary | ICD-10-CM

## 2021-08-03 DIAGNOSIS — L52 Erythema nodosum: Secondary | ICD-10-CM

## 2021-08-03 DIAGNOSIS — M0579 Rheumatoid arthritis with rheumatoid factor of multiple sites without organ or systems involvement: Secondary | ICD-10-CM | POA: Diagnosis not present

## 2021-08-03 DIAGNOSIS — R5383 Other fatigue: Secondary | ICD-10-CM

## 2021-08-03 DIAGNOSIS — M19072 Primary osteoarthritis, left ankle and foot: Secondary | ICD-10-CM

## 2021-08-03 DIAGNOSIS — I773 Arterial fibromuscular dysplasia: Secondary | ICD-10-CM

## 2021-08-03 DIAGNOSIS — Z79899 Other long term (current) drug therapy: Secondary | ICD-10-CM | POA: Diagnosis not present

## 2021-08-03 DIAGNOSIS — M797 Fibromyalgia: Secondary | ICD-10-CM

## 2021-08-03 DIAGNOSIS — I7300 Raynaud's syndrome without gangrene: Secondary | ICD-10-CM

## 2021-08-03 DIAGNOSIS — J849 Interstitial pulmonary disease, unspecified: Secondary | ICD-10-CM

## 2021-08-03 DIAGNOSIS — M19071 Primary osteoarthritis, right ankle and foot: Secondary | ICD-10-CM

## 2021-08-03 DIAGNOSIS — E559 Vitamin D deficiency, unspecified: Secondary | ICD-10-CM

## 2021-08-03 DIAGNOSIS — Z8669 Personal history of other diseases of the nervous system and sense organs: Secondary | ICD-10-CM

## 2021-08-03 MED ORDER — AZATHIOPRINE 50 MG PO TABS: ORAL_TABLET | ORAL | 2 refills | Status: DC

## 2021-08-03 NOTE — Patient Instructions (Addendum)
Standing Labs °We placed an order today for your standing lab work.  ° °Please have your standing labs drawn in March and every 3 months  ° °If possible, please have your labs drawn 2 weeks prior to your appointment so that the provider can discuss your results at your appointment. ° °Please note that you may see your imaging and lab results in MyChart before we have reviewed them. °We may be awaiting multiple results to interpret others before contacting you. °Please allow our office up to 72 hours to thoroughly review all of the results before contacting the office for clarification of your results. ° °We have open lab daily: °Monday through Thursday from 1:30-4:30 PM and Friday from 1:30-4:00 PM °at the office of Dr. [name] Deveshwar, [facility].   °Please be advised, all patients with office appointments requiring lab work will take precedent over walk-in lab work.  °If possible, please come for your lab work on Monday and Friday afternoons, as you may experience shorter wait times. °The office is located at [address], [address], [place], [place] [address] °No appointment is necessary.   °Labs are drawn by [facility]. Please bring your co-pay at the time of your lab draw.  You may receive a bill from [facility] for your lab work. ° °If you wish to have your labs drawn at another location, please call the office 24 hours in advance to send orders. ° °If you have any questions regarding directions or hours of operation,  °please call [phone].   °As a reminder, please drink plenty of water prior to coming for your lab work. Thanks! ° °If you have signs or symptoms of an infection or start antibiotics: °First, call your PCP for workup of your infection. °Hold your medication through the infection, until you complete your antibiotics, and until symptoms resolve if you take the following: °Injectable medication (Actemra, Benlysta, Cimzia, Cosentyx, Enbrel, Humira, Kevzara, Orencia, Remicade, Simponi,  Stelara, Taltz, Tremfya) °Methotrexate °Leflunomide (Arava) °Mycophenolate (Cellcept) °Xeljanz, Olumiant, or Rinvoq ° ° °

## 2021-08-03 NOTE — Addendum Note (Signed)
Addended by: Earnestine Mealing on: 08/03/2021 03:55 PM   Modules accepted: Orders

## 2021-08-04 ENCOUNTER — Telehealth: Payer: Self-pay | Admitting: Pulmonary Disease

## 2021-08-04 DIAGNOSIS — G4733 Obstructive sleep apnea (adult) (pediatric): Secondary | ICD-10-CM | POA: Diagnosis not present

## 2021-08-04 DIAGNOSIS — J471 Bronchiectasis with (acute) exacerbation: Secondary | ICD-10-CM | POA: Diagnosis not present

## 2021-08-04 LAB — CK: Total CK: 59 U/L (ref 29–143)

## 2021-08-04 NOTE — Telephone Encounter (Signed)
Patient's husband dropped off FMLA forms in office, informed husband of $29 form fee and 7-10 turnaround time. Forms sent to [facility] to review.

## 2021-08-04 NOTE — Progress Notes (Signed)
CK is WNL.

## 2021-08-06 ENCOUNTER — Other Ambulatory Visit: Payer: Self-pay | Admitting: Physician Assistant

## 2021-08-11 NOTE — Telephone Encounter (Signed)
Forms will be sent back to [facility] Barrow for signature on 08/14/2021

## 2021-08-14 ENCOUNTER — Ambulatory Visit: Payer: Federal, State, Local not specified - PPO | Admitting: Adult Health

## 2021-08-14 ENCOUNTER — Encounter: Payer: Self-pay | Admitting: Adult Health

## 2021-08-14 VITALS — BP 120/70 | HR 79 | Ht 68.0 in | Wt 285.8 lb

## 2021-08-14 DIAGNOSIS — G43709 Chronic migraine without aura, not intractable, without status migrainosus: Secondary | ICD-10-CM | POA: Diagnosis not present

## 2021-08-14 DIAGNOSIS — Z0289 Encounter for other administrative examinations: Secondary | ICD-10-CM

## 2021-08-14 DIAGNOSIS — I773 Arterial fibromuscular dysplasia: Secondary | ICD-10-CM

## 2021-08-14 DIAGNOSIS — G4485 Primary stabbing headache: Secondary | ICD-10-CM | POA: Diagnosis not present

## 2021-08-14 MED ORDER — GABAPENTIN 100 MG PO CAPS: 300.0000 mg | ORAL_CAPSULE | Freq: Every day | ORAL | 5 refills | Status: DC

## 2021-08-14 NOTE — Patient Instructions (Signed)
Continue emgality Continue Nurtrec/relpax for abortive therapy  Increase gabapentin 300 mg at bedtime

## 2021-08-14 NOTE — Progress Notes (Signed)
PATIENT: Jill Harper DOB: [birth year]  REASON FOR VISIT: follow up HISTORY FROM: patient  HISTORY OF PRESENT ILLNESS: Today 08/14/21:  [name] is a 56 year old female with a history of migraine headaches.  She returns today for follow-up.  She states that her migraines have been under relatively good control.  She states is very rare for her to have 1-2 headaches a month.  Emgality has been lasting at least 4 weeks.  Relpax and Nurtec to offer her benefit if she has to take them.  Patient was getting stabbing headaches during the night.  She states that these episodes have also reduced.  She states on occasion she will have a mild headache later in the evening.  This past Friday and Saturday she did wake up early morning with a stabbing headache.  She did use Relpax with good benefit.  She is inquiring if gabapentin can be increased.  Patient has FMD.  She states that in a support group she is part of imaging a full body scan to identify any other areas that has been affected by FMD.  She is curious if this needs to be completed.  She has an appointment next month with Dr. Delynn Flavin.  02/07/21: [name] is a 56 year old female with a history of migraine headaches.  She returns today for follow-up.  She reports that she has approximately 2 migraines a week.  She typically can take Nurtec or Relpax and the headache resolves fairly quickly.  She uses Ajovy for preventative therapy however this is not covered through her insurance she has been using a co-pay savings card.  She would like to switch to [employer] which is covered through her insurance.  She states that taking gabapentin at bedtime has reduced the severity and frequency of the stabbing headaches that she was receiving.  She does report that she often gets them during the day as well.  At least 2 times a week.  She states that it only lasts for several minutes and afterwards she may feel a little foggy in her head will be tender but that  resolves and she is able to carry on with her day.  She does use oxygen at night.  She returns today for an evaluation.  11/03/20: [name] is a 56 year old female with a history of migraine headaches.  She is currently taking Ajovy every 3 weeks.  She has Relpax and Nurtec that she rotates nightly.  Reports that she has sharp shooting pains in the left parietal region that radiates to the temporal region.  This only occurs at night usually wakes her up from sleep.  She has had this type of discomfort before however it has become more frequent recently.  She states that it only lasts for several seconds to minutes before it resolves.  Patient states that she began taking Nurtec every other night and reports that when she took Nurtec she did not wake up with this discomfort.  She states that she did not added Relpax and will take it on the nights that she did not take Nurtec and this also eliminated the discomfort at night. the patient wears a CPAP machine with supplemental oxygen at night managed by [name].  She states during the day she may have 3 to 4 days a week that she has a mild headache.  She states that the severity has definitely improved.  She typically does not take anything for these headaches.  She reports that sometimes the headaches  can last all day long.  She does feel that Ajovy has improved the severity of her headaches.    HISTORY ,(copied from Dr. Cathren Laine note)  When she has a headache/migraine the relpax works. She may have to take 2 doses. Sometimes tylenol will work alone. She has weired feeling and stabbing pain sometimes and lasts a few minutes in the left parietal area. She feels the Ajovy is not as effective, wears out, we discussed changing to Emgality or possibly taking Ajovy q3weeks, gave her sampes, she will try Ajovt q3weeks. Try Nurtec.      Interval history September 23, 2019: Patient here for follow-up, she was last seen in December for hand pain and EMG nerve conduction  study did show carpal tunnel syndrome.  PMHx vitamin B12 deficiency, sleep apnea, rheumatoid arthritis, pulmonary fibrosis, polymyositis, osteoarthritis, migraines, joint pain, interstitial lung disease, fibromyalgia, fibromuscular dysplasia, bronchiectasis and abnormal liver function test. She saw Dr. Donzetta Matters and vein in vascular for discoloration of her skin, she gets blue tips of fingers and nails, diagnosed with raynaud's phenomenon related to her RA, no vascular intervention, reviewed his notes and discussed with patient as above. She saw Dr. Mervin Hack for the CTS and they are going to monitor, the symptoms have subsided and her symptoms were slight atypical for CTS on the right, she had ulnar neuropathy on the left and she noticed she did have symptoms and she was given prednisone which helped but it still some and goes, Ajovy working for migraines not daily headaches, she can have an occassional headache and it generally subsides on its own occipital on the left feels hot.    Interval history 07/20/2019: Patient here for a new chief complaint as requested by Dr. Estanislado Pandy for hand pain. PMHx vitamin B12 deficiency, sleep apnea, rheumatoid arthritis, pulmonary fibrosis, polymyositis, osteoarthritis, migraines, joint pain, interstitial lung disease, fibromyalgia, fibromuscular dysplasia, bronchiectasis and abnormal liver function test.  In August or September, she had hand pain. Her hands felt cool, right > left, she started getting cramping in the right hand, not significantly painful just cramped, difficult time straightening it, massage helped. A month later it happened again, more painful, and she could see it "drawing", started getting colder to the touch, she woke up with excruciating pain in the hand and forearm ventrally and she was cramped again. She took 2 alleve. Severe, some numbness, pain felt like tightness, severe and visibly "drawing" up. Massaging helped finger by finger. MRI cervical spine was  normal.  She has weakness in her hands. Progressively worsening. Pain in both shoulders. Some numbness in the first 4 digits. No prior diagnosis of CTS. No injury. She also felt a big knot on the inside of her wrist. Like a muscle spasm. No other focal neurologic deficits, associated symptoms, inciting events or modifiable factors.     Personally reviewed MRI cervical spine 07/08/2019: Normal   Cbc/cmp normal 03/2019   Interval history 06/08/2019: Carotid arteries on CTA c/w FMD. Discussed Fibromuscular Dysplasia, sent to Dr. Estanislado Pandy and being followed there. She has sharp pain in the back of the head, stops her because it is so severe, so painful it stops her in her tracks. Leaves her weak afterwards. Can go weeks without it. It is random, unknown what triggers, brief, severe. Also continues to have migraines, left sided, pulsating, pounding, contsant pain, behind the eye, feels she may get ptosis, no lacrimation or rhinorrhea. She has light and sound sensitivity. She has daily headaches, no medication overuse, no aura.  She has 8 migraine days a month with moderate or severe quality and can last 24-72 hours ongoing for > year at this frequency and severity and quality. For FMD and BO meds and a daily asa. Tried atenolol, relpax, telmisartan, verapamil, nortriptyline, topamax. She has some brain fog, discussed normal aging, chronic pain and lifestyle. She has had neck pain, she has numbness and tingling in the left hand and arm, ongoing for >  Months, failed conservative measures, under the care of a physician.     HPI:  RETHER RISON is a 56 y.o. female here as requested by Dr. Quay Burow for headaches. PMHx RA, bronchiectasis, polymyositis on imuran follows with Rheumatology, never smoked, interstitial lung disease, fibromyalgia, migraines, mitral valve prolapse, abnormal liver function test. She was supposed to have a sleep study but did not complete that (per notes [name]).  She reports she has headaches  that come on during exertion and dissipate without medication when she slows down or alters activities. These are different from her usual migraines. She just had sleep test a few weeks ago, snoring, stops breathing. She does not have the results. She wakes up at night feeling like she can't breathe. She wakes with with "new" headache in the setting of sleep problems for at least 5-6 months. She may have the same headache later in the day. The headache is a fogginedd across  the forehead and more on the left.She wakes with this headache in the setting if snoring and lung problems. The headache is worse with exertion, walking and talking, worse with valsalva and also with sex/orgasm. Rest helps and sitting still and it dissipates slowly. Can be moderately severe. Here with husband who provides much information.   Headache medications used: Atenolol, Flexeril, Relpax,   Reviewed notes, labs and imaging from outside physicians, which showed:   Personally reviewed images from 2013 and agree with the following    Reviewed [name] notes.  Last time she was seen was 07/28/2018.  Patient has a history of rheumatoid arthritis, polymyositis followed in rheumatology.  Also bronchiectasis.  She was supposed to have a sleep study that was previously ordered but she did not.  Epworth Sleepiness Scale 7 06/16/2018.   ANA negative, rheumatoid factor XX 9, anti-CCP 213, SCL 70, SSA SSB negative, Jo 1+.  Patient's last echocardiogram 05/29/2017 with ejection fraction 60 to 65%, moderate left ventricular hypertrophy, grade 1 DD.  CT of the sinuses were negative for acute disease.    Called Dr. Juanetta Gosling office and requested results of sleep testing    REVIEW OF SYSTEMS: Out of a complete 14 system review of symptoms, the patient complains only of the following symptoms, and all other reviewed systems are negative.  ALLERGIES: Allergies  Allergen Reactions   Sulfonamide Derivatives     REACTION: hives    HOME  MEDICATIONS: Outpatient Medications Prior to Visit  Medication Sig Dispense Refill   albuterol (PROVENTIL HFA) 108 (90 Base) MCG/ACT inhaler Inhale 2 puffs into the lungs 4 (four) times daily as needed. Reported on 09/03/2015 1 Inhaler 4   albuterol (PROVENTIL) (2.5 MG/3ML) 0.083% nebulizer solution Take 3 mLs (2.5 mg total) by nebulization every 6 (six) hours as needed for wheezing or shortness of breath. 120 vial 5   aspirin EC 81 MG tablet Take 81 mg by mouth daily.     atenolol (TENORMIN) 25 MG tablet TAKE 1/2 TABLET BY MOUTH DAILY AS NEEDED FOR MITRAL PROLAPSE 45 tablet 0   azaTHIOprine (IMURAN)  50 MG tablet Take 2 tablets by mouth daily. 60 tablet 2   BREO ELLIPTA 100-25 MCG/INH AEPB INHALE 1 PUFF INTO THE LUNGS DAILY 60 each 5   Cholecalciferol (VITAMIN D) 50 MCG (2000 UT) CAPS Take by mouth.     diclofenac Sodium (VOLTAREN) 1 % GEL APPLY 2 TO 4 GRAMS TO AFFECTED JOINT FOUR TIMES DAILY AS NEEDED 400 g 2   eletriptan (RELPAX) 20 MG tablet TAKE 1 TABLET BY MOUTH AS NEEDED FOR MIGRAINE 10 tablet 11   ENBREL MINI 50 MG/ML SOCT INSERT 1 MINI CARTRIDGE INTO AUTOINJECTOR AND INJECT 50 MG UNDER THE SKIN EVERY 7 DAYS. 12 mL 0   gabapentin (NEURONTIN) 100 MG capsule Take 2 capsules (200 mg total) by mouth at bedtime. 60 capsule 5   Galcanezumab-gnlm (EMGALITY) 120 MG/ML SOAJ Inject 120 mg into the skin every 30 (thirty) days. 1.12 mL 5   guaiFENesin (MUCINEX) 600 MG 12 hr tablet Take 2 tablets (1,200 mg total) by mouth 2 (two) times daily as needed for cough or to loosen phlegm. (Patient taking differently: Take 1,200 mg by mouth 2 (two) times daily.)     metFORMIN (GLUCOPHAGE) 500 MG tablet TAKE 1 TABLET(500 MG) BY MOUTH TWICE DAILY WITH A MEAL 180 tablet 1   Multiple Vitamins-Minerals (MULTIVITAMIN GUMMIES ADULT PO) Take 2 each by mouth daily.     Respiratory Therapy Supplies (FLUTTER) DEVI Use as directed (Patient taking differently: as needed. Use as directed) 1 each 0   Rimegepant Sulfate  (NURTEC) 75 MG TBDP Take 75 mg by mouth daily as needed. For migraines. Take as close to onset of migraine as possible. One daily maximum. 10 tablet 6   telmisartan (MICARDIS) 20 MG tablet TAKE 1 TABLET(20 MG) BY MOUTH DAILY 90 tablet 1   HYDROcodone bit-homatropine (HYCODAN) 5-1.5 MG/5ML syrup Take 5 mLs by mouth every 6 (six) hours as needed for cough. 240 mL 0   levofloxacin (LEVAQUIN) 500 MG tablet Take 1 tablet (500 mg total) by mouth daily. 3 tablet 0   predniSONE (DELTASONE) 10 MG tablet Take two tablets by mouth in morning x 7 days 14 tablet 0   No facility-administered medications prior to visit.    PAST MEDICAL HISTORY: Past Medical History:  Diagnosis Date   Abnormal liver function test    Bronchiectasis        Dyspnea    Fibromuscular dysplasia (HCC)    Fibromyalgia    Heart murmur    ILD (interstitial lung disease) (HCC)    Joint pain    Menorrhagia    MIGRAINE HEADACHE    MVP (mitral valve prolapse)    Osteoarthritis    Polymyositis (HCC)    Dr [name]; chronic MTX   Pulmonary fibrosis (HCC)    Rheumatoid arthritis ([place])    Sleep apnea    Vitamin B 12 deficiency    Vitamin D deficiency     PAST SURGICAL HISTORY: Past Surgical History:  Procedure Laterality Date   ablation uterine  2010   CARDIAC CATHETERIZATION  2002   normal   DILATION AND CURETTAGE OF UTERUS  08-31-08   Dr [name]   IR ANGIO INTRA EXTRACRAN SEL COM CAROTID INNOMINATE BILAT MOD SED  09/18/2018   IR ANGIO VERTEBRAL SEL VERTEBRAL BILAT MOD SED  09/18/2018   IR US GUIDE VASC ACCESS RIGHT  09/18/2018    FAMILY HISTORY: Family History  Problem Relation Age of Onset   Diabetes Mother    Fibromyalgia Mother  Ulcers Mother    Heart failure Mother    Thyroid disease Mother    Obesity Mother    Multiple myeloma Father    Hypertension Father    Stroke Father    Lupus Sister    Other Sister        abdominal adhesions resulting in bowel obstruction   Migraines Sister     Heart disease Brother        bypass surgery   Rheum arthritis Sister    Multiple myeloma Sister    Hypertension Sister    Heart attack Sister    Diabetes Sister    Hypertension Sister    Rheum arthritis Sister    Diabetes Sister    Diabetes Brother    Headache Other        siblings with headaches but not diagnosed as migraines    SOCIAL HISTORY: Social History   Socioeconomic History   Marital status: Married    Spouse name: Joleene Burnham   Number of children: 1   Years of education: Not on file   Highest education level: High school graduate  Occupational History   Occupation: Compliant and Injury Clerk  Tobacco Use   Smoking status: Never   Smokeless tobacco: Never   Tobacco comments:    Married, lives with spouse. 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  Vaping Use   Vaping Use: Never used  Substance and Sexual Activity   Alcohol use: Not Currently    Comment: maybe a wine cooler once every other year   Drug use: Never   Sexual activity: Yes    Birth control/protection: Surgical  Other Topics Concern   Not on file  Social History Narrative   Exercise: trying to walk - limited by fatigue   Lives at home with husband    Right handed   Caffeine: none    Social Determinants of Health   Financial Resource Strain: Not on file  Food Insecurity: Not on file  Transportation Needs: Not on file  Physical Activity: Not on file  Stress: Not on file  Social Connections: Not on file  Intimate Partner Violence: Not on file      PHYSICAL EXAM  Vitals:   08/14/21 1444  BP: 120/70  Pulse: 79  Weight: 285 lb 12.8 oz (129.6 kg)  Height: _0  (1.727 m)    Body mass index is 43.46 kg/m.  Generalized: Well developed, in no acute distress   Neurological examination  Mentation: Alert oriented to time, place, history taking. Follows all commands speech and language fluent Cranial nerve II-XII: Pupils were equal round reactive to light.  Extraocular movements were full, visual field were full on confrontational test. Facial sensation and strength were normal. Uvula tongue midline. Head turning and shoulder shrug  were normal and symmetric. Motor: The motor testing reveals 5 over 5 strength of all 4 extremities. Good symmetric motor tone is noted throughout.  Sensory: Sensory testing is intact to soft touch on all 4 extremities. No evidence of extinction is noted.  Coordination: Cerebellar testing reveals good finger-nose-finger and heel-to-shin bilaterally.  Gait and station: Gait is normal.  Reflexes: Deep tendon reflexes are symmetric and normal bilaterally.   DIAGNOSTIC DATA (LABS, IMAGING, TESTING) - I reviewed patient records, labs, notes, testing and imaging myself where available.  Lab Results  Component Value Date   WBC 8.6 07/11/2021   HGB 13.0 07/11/2021   HCT 39.5 07/11/2021   MCV 84.8 07/11/2021  PLT 259.0 07/11/2021      Component Value Date/Time   NA 140 07/11/2021 0813   NA 141 10/12/2019 1432   K 4.0 07/11/2021 0813   CL 106 07/11/2021 0813   CO2 27 07/11/2021 0813   GLUCOSE 108 (H) 07/11/2021 0813   BUN 9 07/11/2021 0813   BUN 11 10/12/2019 1432   CREATININE 0.67 07/11/2021 0813   CREATININE 0.61 05/10/2021 1437   CALCIUM 9.5 07/11/2021 0813   PROT 7.6 07/11/2021 0813   PROT 7.7 10/12/2019 1432   ALBUMIN 4.1 07/11/2021 0813   ALBUMIN 4.6 10/12/2019 1432   AST 13 07/11/2021 0813   ALT 9 07/11/2021 0813   ALKPHOS 60 07/11/2021 0813   BILITOT 0.5 07/11/2021 0813   BILITOT 0.3 10/12/2019 1432   GFRNONAA 98 01/18/2021 1626   GFRAA 114 01/18/2021 1626   Lab Results  Component Value Date   CHOL 129 07/11/2021   HDL 39.60 07/11/2021   LDLCALC 74 07/11/2021   TRIG 78.0 07/11/2021   CHOLHDL 3 07/11/2021   Lab Results  Component Value Date   HGBA1C 5.9 07/11/2021   Lab Results  Component Value Date   [id number] 902 (H) 07/11/2021   Lab Results  Component Value Date   TSH 6.06 (H)  07/11/2021      ASSESSMENT AND PLAN 56 y.o. year old female  has a past medical history of Abnormal liver function test, Bronchiectasis, Dyspnea, Fibromuscular dysplasia (HCC), Fibromyalgia, Heart murmur, ILD (interstitial lung disease) ([place]), Joint pain, Menorrhagia, MIGRAINE HEADACHE, MVP (mitral valve prolapse), Osteoarthritis, Polymyositis ([place]), Pulmonary fibrosis ([place]), Rheumatoid arthritis ([place]), Sleep apnea, Vitamin B 12 deficiency, and Vitamin D deficiency. here with:  1.  Migraine headache   --Continue Emgality 120 mg monthly injection --Continue Relpax and Nurtec for abortive therapy  2.  Stabbing headache  -- Increase gabapentin to 300 mg at bedtime  3.  Fibromuscular dysplasia  --Patient has an appointment next month to discuss with Dr. Jaynee Eagles.  She is curious if a full body scan is needed to identify any other areas affected by FMD?  She is also curious how it is correlating/affecting other symptoms she has.   Follow-up in 6 months or sooner if needed   Ward Givens, MSN, NP-C 08/14/2021, 2:47 PM [facility] [address], [place], [place] [address] [phone]

## 2021-08-14 NOTE — Telephone Encounter (Signed)
Forms signed, patient will pick up and pay $29 fee tomorrow.

## 2021-08-15 ENCOUNTER — Ambulatory Visit: Payer: Federal, State, Local not specified - PPO

## 2021-08-15 ENCOUNTER — Other Ambulatory Visit: Payer: Self-pay | Admitting: *Deleted

## 2021-08-15 DIAGNOSIS — J479 Bronchiectasis, uncomplicated: Secondary | ICD-10-CM

## 2021-08-15 DIAGNOSIS — J471 Bronchiectasis with (acute) exacerbation: Secondary | ICD-10-CM

## 2021-08-21 ENCOUNTER — Ambulatory Visit: Payer: Federal, State, Local not specified - PPO | Admitting: Pulmonary Disease

## 2021-08-21 ENCOUNTER — Ambulatory Visit (INDEPENDENT_AMBULATORY_CARE_PROVIDER_SITE_OTHER): Payer: Federal, State, Local not specified - PPO

## 2021-08-21 ENCOUNTER — Other Ambulatory Visit: Payer: Self-pay

## 2021-08-21 ENCOUNTER — Encounter: Payer: Self-pay | Admitting: Pulmonary Disease

## 2021-08-21 VITALS — BP 136/70 | HR 83 | Temp 98.5°F | Ht 68.0 in | Wt 286.0 lb

## 2021-08-21 DIAGNOSIS — G4733 Obstructive sleep apnea (adult) (pediatric): Secondary | ICD-10-CM

## 2021-08-21 DIAGNOSIS — G4734 Idiopathic sleep related nonobstructive alveolar hypoventilation: Secondary | ICD-10-CM | POA: Diagnosis not present

## 2021-08-21 DIAGNOSIS — J479 Bronchiectasis, uncomplicated: Secondary | ICD-10-CM | POA: Diagnosis not present

## 2021-08-21 DIAGNOSIS — J849 Interstitial pulmonary disease, unspecified: Secondary | ICD-10-CM | POA: Diagnosis not present

## 2021-08-21 MED ORDER — TRELEGY ELLIPTA 100-62.5-25 MCG/ACT IN AEPB: 1.0000 | INHALATION_SPRAY | Freq: Once | RESPIRATORY_TRACT | 0 refills | Status: AC

## 2021-08-21 MED ORDER — TRELEGY ELLIPTA 100-62.5-25 MCG/ACT IN AEPB
1.0000 | INHALATION_SPRAY | Freq: Every day | RESPIRATORY_TRACT | 5 refills | Status: DC
Start: 2021-08-21 — End: 2022-02-26

## 2021-08-21 NOTE — Progress Notes (Signed)
[facility]  Chief Complaint  Patient presents with   Follow-up    Follow up. Pt states she recovered from infection she had in November. Pt states she still has SHOB and coughing.     Constitutional:  BP 136/70 (BP Location: Left Arm, Patient Position: Sitting, Cuff Size: Normal)    Pulse 83    Temp 98.5 F (36.9 C) (Oral)    Ht _0  (1.727 m)    Wt 286 lb (129.7 kg)    SpO2 99%    BMI 43.49 kg/m   Past Medical History:  Fibromyalgia, Migraine HA, MVP, OA, Fibromuscular dysplasia, Erythema nodosum, Vit D deficiency  Past Surgical History:  Her  has a past surgical history that includes Cardiac catheterization (2002); Dilation and curettage of uterus (08-31-08); ablation uterine (2010); IR ANGIO VERTEBRAL SEL VERTEBRAL BILAT MOD SED (09/18/2018); IR ANGIO INTRA EXTRACRAN SEL COM CAROTID INNOMINATE BILAT MOD SED (09/18/2018); and IR US Guide Vasc Access Right (09/18/2018).  Brief Summary:  Jill Harper is a 56 y.o. female with bronchiectasis in the setting of seropositive rheumatoid arthritis and polymyositis.      Subjective:   She say Derl Barrow in November and then December.  She had increased productive cough, fatigue, and low grade temperature.  She was treated with levaquin 500 mg for 7 days, and prn hycodan.  She feels some improvement since then, but doesn't feel like things are back to normal.  She is needing to use her oxygen more often during the day.  Her chest xray today shows chronic interstitial changes.    PFT from 07/27/21 showed mild restriction which has been stable compared to PFT from 02/17/20.  Physical Exam:   Appearance - well kempt   ENMT - no sinus tenderness, no oral exudate, no LAN, Mallampati 3 airway, no stridor  Respiratory - equal breath sounds bilaterally, no wheezing or rales  CV - s1s2 regular rate and rhythm, 2/6 SM  Ext - no clubbing, no edema  Skin - no rashes  Psych - normal mood and  affect    Pulmonary testing:  PFT 10/30/11 >> FEV1 3.31 (114%), FEV1% 85, TLC 6.06 (108%), DLCO 77%, no BD PFT 11/18/12 >> FEV1 3.18 (111%), FEV1% 84, TLC 5.34 (95%), DLCO 67%, no BD PFT 12/02/15 >> FEV1 2.96 (108%), FEV1% 92, TLC 4.95 (86%), DLCO 65%, no BD PFT 04/17/18 >> FEV1 2.46 (101%), FEV1% 90, DLCO 76% PFT 02/05/19 >> FEV1 2.30 (87%), FEV1% 89, TLC 4.21 (74%), DLCO 81%, no BD PFT 02/17/20 >> FEV1 2.18 (83%), FEV1% 90, TLC 4.22 (74%), DLCO 78% PFT 07/27/21 >> FEV1 2.41 (93%), FEV1% 89, TLC 4.04 (71%), DLCO 80%  Chest Imaging:  CT chest 03/23/05 >> patchy b/l lower lung ASD with cylindrical BTX CT chest 02/17/08 >> peripheral and basilar predominant subpleural GGO CT chest 12/01/08 >> no change HRCT chest 09/07/15 >> scattered GGO HRCT chest 04/15/18 >> patchy confluent subpleural and peripheral peribronchovascular reticulation and ground-glass opacity throughout both lungs with a strong basilar gradient, traction BTX HRCT chest 03/02/20 >> widespread patchy GGO, septal thickening, mild cylindrical BTX, craniocaudal gradient with mild progression, mild air trapping  Labs:  Quantiferon gold 01/31/17 >> negative Serology 01/31/17 >> RF 29, CCP 213, Jo-1 > 8; ANA, SCL 70, SSA/SSB, ENA Sm negative Quantiferon gold 01/31/17 >> negative Ig 01/31/17 >> IgG 1992, IgA 147, IgM 136  Sleep Tests:  HST 07/28/18 >> AHI 5, SpO2 low 83% ONO with CPAP 11/05/18 >>  test time 6 hrs 24 min, baseline SpO2 90%, low SpO2 80%; spent 1 hr 15 min with SpO2 < 88% Auto CPAP 04/25/20 to 05/24/20 >> used on 30 of 30 nights with average 8 hrs 35 min.  Average AHI 1 with median CPAP 7 and 95 th percentile CPAP 10 cm H2O  Cardiac Tests:  Echo 11/22/20 >> EF 65 to 70%, mild LVH  Social History:  She  reports that she has never smoked. She has never used smokeless tobacco. She reports that she does not currently use alcohol. She reports that she does not use drugs.  Family History:  Her family history includes Diabetes in her  brother, mother, sister, and sister; Fibromyalgia in her mother; Headache in an other family member; Heart attack in her sister; Heart disease in her brother; Heart failure in her mother; Hypertension in her father, sister, and sister; Lupus in her sister; Migraines in her sister; Multiple myeloma in her father and sister; Obesity in her mother; Other in her sister; Rheum arthritis in her sister and sister; Stroke in her father; Thyroid disease in her mother; Ulcers in her mother.     Assessment/Plan:   ILD with UIP pattern and Bronchiectasis in setting of RA and polymyositis. - she has progressive symptoms since she had respiratory infection in November 2022 - will have her try trelegy 100 one puff daily in place of breo - continue albuterol prn - continue mucinex, and prn flutter valve - if her symptoms don't improve with change to trelegy, then will need to repeat CT chest imaging  Rheumatoid arthritis with positive RF and CCP, polymyositis. - followed by Dr. Bo Merino with rheumatology - maintained on imuran and enbrel   Obstructive sleep apnea. - she is compliant with CPAP and reports benefit from therapy - she uses [employer] for her DME - continue auto CPAP 7 to 15 cm H2O   Nocturnal hypoxemia. - 2.5 liters oxygen at night with CPAP and prn during the day with dyspnea and headache  Migraine headache. - followed by Dr. Sarina Ill with [facility]  Time Spent Involved in Patient Care on Day of Examination:  38 minutes  Follow up:   Patient Instructions  Trelegy one puff daily, and rinse your mouth after each use  Follow up in 6 weeks  Medication List:   Allergies as of 08/21/2021       Reactions   Sulfonamide Derivatives    REACTION: hives        Medication List        Accurate as of August 21, 2021  5:15 PM. If you have any questions, ask your nurse or doctor.          STOP taking these medications    Breo Ellipta 100-25 MCG/ACT  Aepb Generic drug: fluticasone furoate-vilanterol Stopped by: Chesley Mires, MD   HYDROcodone bit-homatropine 5-1.5 MG/5ML syrup Commonly known as: HYCODAN Stopped by: Chesley Mires, MD   levofloxacin 500 MG tablet Commonly known as: LEVAQUIN Stopped by: Chesley Mires, MD   predniSONE 10 MG tablet Commonly known as: DELTASONE Stopped by: Chesley Mires, MD       TAKE these medications    albuterol 108 (90 Base) MCG/ACT inhaler Commonly known as: Proventil HFA Inhale 2 puffs into the lungs 4 (four) times daily as needed. Reported on 09/03/2015   albuterol (2.5 MG/3ML) 0.083% nebulizer solution Commonly known as: PROVENTIL Take 3 mLs (2.5 mg total) by nebulization every 6 (six) hours as needed for wheezing  or shortness of breath.   aspirin EC 81 MG tablet Take 81 mg by mouth daily.   atenolol 25 MG tablet Commonly known as: TENORMIN TAKE 1/2 TABLET BY MOUTH DAILY AS NEEDED FOR MITRAL PROLAPSE   azaTHIOprine 50 MG tablet Commonly known as: IMURAN Take 2 tablets by mouth daily.   diclofenac Sodium 1 % Gel Commonly known as: VOLTAREN APPLY 2 TO 4 GRAMS TO AFFECTED JOINT FOUR TIMES DAILY AS NEEDED   eletriptan 20 MG tablet Commonly known as: RELPAX TAKE 1 TABLET BY MOUTH AS NEEDED FOR MIGRAINE   Emgality 120 MG/ML Soaj Generic drug: Galcanezumab-gnlm Inject 120 mg into the skin every 30 (thirty) days.   Enbrel Mini 50 MG/ML Soct Generic drug: Etanercept INSERT 1 MINI CARTRIDGE INTO AUTOINJECTOR AND INJECT 50 MG UNDER THE SKIN EVERY 7 DAYS.   Flutter Devi Use as directed What changed:  when to take this reasons to take this   gabapentin 100 MG capsule Commonly known as: NEURONTIN Take 3 capsules (300 mg total) by mouth at bedtime.   guaiFENesin 600 MG 12 hr tablet Commonly known as: Mucinex Take 2 tablets (1,200 mg total) by mouth 2 (two) times daily as needed for cough or to loosen phlegm. What changed: when to take this   metFORMIN 500 MG tablet Commonly known  as: GLUCOPHAGE TAKE 1 TABLET(500 MG) BY MOUTH TWICE DAILY WITH A MEAL   MULTIVITAMIN GUMMIES ADULT PO Take 2 each by mouth daily.   Nurtec 75 MG Tbdp Generic drug: Rimegepant Sulfate Take 75 mg by mouth daily as needed. For migraines. Take as close to onset of migraine as possible. One daily maximum.   telmisartan 20 MG tablet Commonly known as: MICARDIS TAKE 1 TABLET(20 MG) BY MOUTH DAILY   Trelegy Ellipta 100-62.5-25 MCG/ACT Aepb Generic drug: Fluticasone-Umeclidin-Vilant Inhale 1 puff into the lungs daily. Started by: Chesley Mires, MD   Trelegy Ellipta 100-62.5-25 MCG/ACT Aepb Generic drug: Fluticasone-Umeclidin-Vilant Inhale 1 puff into the lungs once for 1 dose. Started by: Chesley Mires, MD   Vitamin D 50 MCG (2000 UT) Caps Take by mouth.        Signature:  Chesley Mires, MD [place] Pager - [phone] 08/21/2021, 5:15 PM

## 2021-08-21 NOTE — Patient Instructions (Signed)
Trelegy one puff daily, and rinse your mouth after each use  Follow up in 6 weeks

## 2021-08-28 DIAGNOSIS — G4733 Obstructive sleep apnea (adult) (pediatric): Secondary | ICD-10-CM | POA: Diagnosis not present

## 2021-08-28 DIAGNOSIS — J471 Bronchiectasis with (acute) exacerbation: Secondary | ICD-10-CM | POA: Diagnosis not present

## 2021-09-05 ENCOUNTER — Encounter: Payer: Self-pay | Admitting: Pulmonary Disease

## 2021-09-05 NOTE — Progress Notes (Signed)
Subjective:    Patient ID: Jill Harper, female    DOB: 1965-12-31, 56 y.o.   MRN: [id number]  This visit occurred during the SARS-CoV-2 public health emergency.  Safety protocols were in place, including screening questions prior to the visit, additional usage of staff PPE, and extensive cleaning of exam room while observing appropriate contact time as indicated for disinfecting solutions.     HPI The patient is here for follow up of her FMD.  Needs referral for Dr Sallyanne Havers IR for whole body scan for FMD.  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  Did not feel well - felt weird and had a different type of headache - she took took her evening BP medication early and she felt better.   She felt like the pressure valve released and she felt better.  She did not check her BP at that time.   This has happened several times.  It occurs at different times of the day.  She thinks high stress is associated with it and she is trying deep breathing and other things to help.  Her stress level is higher than normal.  She is also had some increased episodes of Raynaud's syndrome and was advised recently that that partially could be related to stress.  In discussing although she realizes that work has been a lot more stressful-her load is more and she is not able to do the quality work that she used to be able to do.  Sometimes she is has difficulty sleeping.    Medications and allergies reviewed with patient and updated if appropriate.  Patient Active Problem List   Diagnosis Date Noted   Tingling 06/28/2021   Occipital neuralgia of left side 01/06/2020   Bilateral carpal tunnel syndrome 07/21/2019   Costochondritis 03/16/2019   Rib pain on right side 03/16/2019   Insulin resistance 11/27/2018   Fibromuscular dysplasia ([place]) 10/17/2018   Hypertension 10/17/2018   Bronchiectasis with (acute) exacerbation ([place]) 10/08/2018   OSA (obstructive sleep  apnea) 08/20/2018   Rheumatoid arthritis ([place]) 04/17/2018   ILD (interstitial lung disease) ([place]) 04/17/2018   Dyspnea 04/01/2018   Bronchiectasis ([place]) 09/02/2017   LVH (left ventricular hypertrophy), mild 06/02/2017   Mitral regurgitation 05/22/2017   Jo-1 antibody positive 01/29/2017   Primary osteoarthritis of both feet 01/29/2017   Primary osteoarthritis of both hands 01/29/2017   Left shoulder tendonitis 01/17/2017   Vitamin D deficiency 04/26/2016   Cough 07/18/2015   MVP (mitral valve prolapse)    Erythema nodosum    INCONTINENCE, URGE 05/26/2010   Migraine headache 02/15/2010   Fibromyalgia 06/20/2009   Polymyositis ([place]) 02/11/2008   BRONCHIECTASIS 02/10/2008    Current Outpatient Medications on File Prior to Visit  Medication Sig Dispense Refill   albuterol (PROVENTIL HFA) 108 (90 Base) MCG/ACT inhaler Inhale 2 puffs into the lungs 4 (four) times daily as needed. Reported on 09/03/2015 1 Inhaler 4   albuterol (PROVENTIL) (2.5 MG/3ML) 0.083% nebulizer solution Take 3 mLs (2.5 mg total) by nebulization every 6 (six) hours as needed for wheezing or shortness of breath. 120 vial 5   aspirin EC 81 MG tablet Take 81 mg by mouth daily.     atenolol (TENORMIN) 25 MG tablet TAKE 1/2 TABLET BY MOUTH DAILY AS NEEDED FOR MITRAL PROLAPSE 45 tablet 0   azaTHIOprine (IMURAN) 50 MG tablet Take 2 tablets by mouth daily. 60 tablet 2   Cholecalciferol (VITAMIN D) 50  MCG (2000 UT) CAPS Take by mouth.     diclofenac Sodium (VOLTAREN) 1 % GEL APPLY 2 TO 4 GRAMS TO AFFECTED JOINT FOUR TIMES DAILY AS NEEDED 400 g 2   eletriptan (RELPAX) 20 MG tablet TAKE 1 TABLET BY MOUTH AS NEEDED FOR MIGRAINE 10 tablet 11   ENBREL MINI 50 MG/ML SOCT INSERT 1 MINI CARTRIDGE INTO AUTOINJECTOR AND INJECT 50 MG UNDER THE SKIN EVERY 7 DAYS. 12 mL 0   Fluticasone-Umeclidin-Vilant (TRELEGY ELLIPTA) 100-62.5-25 MCG/ACT AEPB Inhale 1 puff into the lungs daily. 60 each 5   gabapentin (NEURONTIN) 100 MG capsule Take 3  capsules (300 mg total) by mouth at bedtime. 90 capsule 5   Galcanezumab-gnlm (EMGALITY) 120 MG/ML SOAJ Inject 120 mg into the skin every 30 (thirty) days. 1.12 mL 5   guaiFENesin (MUCINEX) 600 MG 12 hr tablet Take 2 tablets (1,200 mg total) by mouth 2 (two) times daily as needed for cough or to loosen phlegm. (Patient taking differently: Take 1,200 mg by mouth 2 (two) times daily.)     metFORMIN (GLUCOPHAGE) 500 MG tablet TAKE 1 TABLET(500 MG) BY MOUTH TWICE DAILY WITH A MEAL 180 tablet 1   Multiple Vitamins-Minerals (MULTIVITAMIN GUMMIES ADULT PO) Take 2 each by mouth daily.     Respiratory Therapy Supplies (FLUTTER) DEVI Use as directed (Patient taking differently: as needed. Use as directed) 1 each 0   Rimegepant Sulfate (NURTEC) 75 MG TBDP Take 75 mg by mouth daily as needed. For migraines. Take as close to onset of migraine as possible. One daily maximum. 10 tablet 6   telmisartan (MICARDIS) 20 MG tablet TAKE 1 TABLET(20 MG) BY MOUTH DAILY 90 tablet 1   No current facility-administered medications on file prior to visit.    Past Medical History:  Diagnosis Date   Abnormal liver function test    Bronchiectasis        Dyspnea    Fibromuscular dysplasia (HCC)    Fibromyalgia    Heart murmur    ILD (interstitial lung disease) (HCC)    Joint pain    Menorrhagia    MIGRAINE HEADACHE    MVP (mitral valve prolapse)    Osteoarthritis    Polymyositis (HCC)    Dr [name]; chronic MTX   Pulmonary fibrosis (HCC)    Rheumatoid arthritis ([place])    Sleep apnea    Vitamin B 12 deficiency    Vitamin D deficiency     Past Surgical History:  Procedure Laterality Date   ablation uterine  2010   CARDIAC CATHETERIZATION  2002   normal   DILATION AND CURETTAGE OF UTERUS  08-31-08   Dr [name]   IR [place] SEL COM CAROTID INNOMINATE BILAT MOD SED  09/18/2018   IR ANGIO VERTEBRAL SEL VERTEBRAL BILAT MOD SED  09/18/2018   IR US GUIDE VASC ACCESS RIGHT  09/18/2018     Social History   Socioeconomic History   Marital status: Married    Spouse name: Vernee Baines   Number of children: 1   Years of education: Not on file   Highest education level: High school graduate  Occupational History   Occupation: Compliant and Injury Clerk  Tobacco Use   Smoking status: Never   Smokeless tobacco: Never   Tobacco comments:    Married, lives with spouse. 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  Vaping Use   Vaping Use: Never used  Substance and Sexual Activity   Alcohol  use: Not Currently    Comment: maybe a wine cooler once every other year   Drug use: Never   Sexual activity: Yes    Birth control/protection: Surgical  Other Topics Concern   Not on file  Social History Narrative   Exercise: trying to walk - limited by fatigue   Lives at home with husband    Right handed   Caffeine: none    Social Determinants of Health   Financial Resource Strain: Not on file  Food Insecurity: Not on file  Transportation Needs: Not on file  Physical Activity: Not on file  Stress: Not on file  Social Connections: Not on file    Family History  Problem Relation Age of Onset   Diabetes Mother    Fibromyalgia Mother    Ulcers Mother    Heart failure Mother    Thyroid disease Mother    Obesity Mother    Multiple myeloma Father    Hypertension Father    Stroke Father    Lupus Sister    Other Sister        abdominal adhesions resulting in bowel obstruction   Migraines Sister    Heart disease Brother        bypass surgery   Rheum arthritis Sister    Multiple myeloma Sister    Hypertension Sister    Heart attack Sister    Diabetes Sister    Hypertension Sister    Rheum arthritis Sister    Diabetes Sister    Diabetes Brother    Headache Other        siblings with headaches but not diagnosed as migraines    Review of Systems  Constitutional:  Negative for fever.  Respiratory:  Positive for shortness of breath  (Chronic-unchanged). Negative for cough and wheezing.   Cardiovascular:  Negative for chest pain, palpitations and leg swelling.  Neurological:  Positive for headaches.  Psychiatric/Behavioral:  Positive for sleep disturbance. The patient is nervous/anxious.       Objective:   Vitals:   09/06/21 1509  BP: 128/80  Pulse: 80  Temp: 98.3 F (36.8 C)  SpO2: 99%   BP Readings from Last 3 Encounters:  09/06/21 128/80  08/21/21 136/70  08/14/21 120/70   Wt Readings from Last 3 Encounters:  09/06/21 283 lb (128.4 kg)  08/21/21 286 lb (129.7 kg)  08/14/21 285 lb 12.8 oz (129.6 kg)   Body mass index is 43.03 kg/m.   Physical Exam    Constitutional: Appears well-developed and well-nourished. No distress.  HENT:  Head: Normocephalic and atraumatic.  Cardiovascular: Normal rate, regular rhythm and normal heart sounds.   No murmur heard. No carotid bruit .  No edema Pulmonary/Chest: Effort normal and breath sounds normal. No respiratory distress. No has no wheezes. No rales.  Skin: Skin is warm and dry. Not diaphoretic.  Psychiatric: Normal mood and affect. Behavior is normal.      Assessment & Plan:      See Problem List for Assessment and Plan of chronic medical problems.

## 2021-09-06 ENCOUNTER — Other Ambulatory Visit: Payer: Self-pay

## 2021-09-06 ENCOUNTER — Ambulatory Visit: Payer: Federal, State, Local not specified - PPO | Admitting: Internal Medicine

## 2021-09-06 ENCOUNTER — Encounter: Payer: Self-pay | Admitting: Internal Medicine

## 2021-09-06 VITALS — BP 128/80 | HR 80 | Temp 98.3°F | Ht 68.0 in | Wt 283.0 lb

## 2021-09-06 DIAGNOSIS — I773 Arterial fibromuscular dysplasia: Secondary | ICD-10-CM | POA: Diagnosis not present

## 2021-09-06 DIAGNOSIS — I10 Essential (primary) hypertension: Secondary | ICD-10-CM

## 2021-09-06 DIAGNOSIS — F419 Anxiety disorder, unspecified: Secondary | ICD-10-CM | POA: Diagnosis not present

## 2021-09-06 NOTE — Assessment & Plan Note (Addendum)
Chronic BP well controlled-I do not think the feelings she has problem recently related to her blood pressure being elevated-probably more stress related-no change in medications Start monitoring at home Continue telmisartan 20 mg daily

## 2021-09-06 NOTE — Telephone Encounter (Signed)
Please advise on mychart.  ?

## 2021-09-06 NOTE — Assessment & Plan Note (Signed)
Chronic Refer to Dr. [name] for whole-body scan to evaluate for FMD

## 2021-09-06 NOTE — Assessment & Plan Note (Signed)
New She has not been feeling well at times-feeling weird and has had some different type of headaches Initially she thought this was related to her blood pressure being high and did not check her blood pressure and has not yet checked it-I think we both agree that is probably not her blood pressure, but she will start monitoring her BP at home to make sure Likely related to stress and anxiety from work She has been thinking about calling [facility] and stressed that she makes that call-I think they will be able to help her She deferred any medication Discussed natural ways of helping to cope with some of the stress and anxiety Discussed things that she needs to change-she will not be able to do her A-plus quality of work because the amount of work that she has and the demands that she has-that is just not possible.  She needs to learn how to except some of that and make revisions and how and what she does

## 2021-09-06 NOTE — Patient Instructions (Addendum)
° ° ° °  Medications changes include :   none    A referral was ordered for Dr Estanislado Pandy.     Start monitoring your BP regularly     Call [facility].

## 2021-09-19 ENCOUNTER — Other Ambulatory Visit: Payer: Self-pay | Admitting: Internal Medicine

## 2021-09-19 DIAGNOSIS — R7303 Prediabetes: Secondary | ICD-10-CM

## 2021-09-24 NOTE — Progress Notes (Signed)
Subjective:    Patient ID: Jill Harper, female    DOB: 1966-02-02, 56 y.o.   MRN: [id number]  This visit occurred during the SARS-CoV-2 public health emergency.  Safety protocols were in place, including screening questions prior to the visit, additional usage of staff PPE, and extensive cleaning of exam room while observing appropriate contact time as indicated for disinfecting solutions.     HPI The patient is here for follow up of their chronic medical problems, including anxiety, BP  Anxiety -- she did call [facility] and had to wait two weeks.  She had intake and was not able to see him this week and has to wait until next week.  Things have gotten worse.  She is on vacation and extended that to two weeks.  She is having panic attacks - shaky on inside, hot/cold, SOB, feels like she is underwater.  She feels like everything is out of controlled.  This has happened twice before - once was one she had three close family/friend members die in close proximity.  The other time was when a post worker killed two other postal workers and then killed himself.  In the past lavender oil and peppermint oil would help - that is helping.  She is struggling - her stress level is 50-60 /10.  She can not sleep well - keeps waking up.  Her mind races at night.  She has difficulty concentrating.  She has had intermittent chest tightness.  She is having a hard time doing household things - ordering her groceries online.      Medications and allergies reviewed with patient and updated if appropriate.  Patient Active Problem List   Diagnosis Date Noted   Anxiety 09/06/2021   Tingling 06/28/2021   Occipital neuralgia of left side 01/06/2020   Bilateral carpal tunnel syndrome 07/21/2019   Costochondritis 03/16/2019   Rib pain on right side 03/16/2019   Insulin resistance 11/27/2018   Fibromuscular dysplasia ([place]) 10/17/2018   Hypertension 10/17/2018   Bronchiectasis with (acute) exacerbation ([place])  10/08/2018   OSA (obstructive sleep apnea) 08/20/2018   Rheumatoid arthritis ([place]) 04/17/2018   ILD (interstitial lung disease) ([place]) 04/17/2018   Dyspnea 04/01/2018   Bronchiectasis ([place]) 09/02/2017   LVH (left ventricular hypertrophy), mild 06/02/2017   Mitral regurgitation 05/22/2017   Jo-1 antibody positive 01/29/2017   Primary osteoarthritis of both feet 01/29/2017   Primary osteoarthritis of both hands 01/29/2017   Left shoulder tendonitis 01/17/2017   Vitamin D deficiency 04/26/2016   Cough 07/18/2015   MVP (mitral valve prolapse)    Erythema nodosum    INCONTINENCE, URGE 05/26/2010   Migraine headache 02/15/2010   Fibromyalgia 06/20/2009   Polymyositis ([place]) 02/11/2008   BRONCHIECTASIS 02/10/2008    Current Outpatient Medications on File Prior to Visit  Medication Sig Dispense Refill   albuterol (PROVENTIL HFA) 108 (90 Base) MCG/ACT inhaler Inhale 2 puffs into the lungs 4 (four) times daily as needed. Reported on 09/03/2015 1 Inhaler 4   albuterol (PROVENTIL) (2.5 MG/3ML) 0.083% nebulizer solution Take 3 mLs (2.5 mg total) by nebulization every 6 (six) hours as needed for wheezing or shortness of breath. 120 vial 5   aspirin EC 81 MG tablet Take 81 mg by mouth daily.     atenolol (TENORMIN) 25 MG tablet TAKE 1/2 TABLET BY MOUTH DAILY AS NEEDED FOR MITRAL PROLAPSE 45 tablet 0   azaTHIOprine (IMURAN) 50 MG tablet Take 2 tablets by mouth daily. 60 tablet 2  Cholecalciferol (VITAMIN D) 50 MCG (2000 UT) CAPS Take by mouth.     diclofenac Sodium (VOLTAREN) 1 % GEL APPLY 2 TO 4 GRAMS TO AFFECTED JOINT FOUR TIMES DAILY AS NEEDED 400 g 2   eletriptan (RELPAX) 20 MG tablet TAKE 1 TABLET BY MOUTH AS NEEDED FOR MIGRAINE 10 tablet 11   ENBREL MINI 50 MG/ML SOCT INSERT 1 MINI CARTRIDGE INTO AUTOINJECTOR AND INJECT 50 MG UNDER THE SKIN EVERY 7 DAYS. 12 mL 0   Fluticasone-Umeclidin-Vilant (TRELEGY ELLIPTA) 100-62.5-25 MCG/ACT AEPB Inhale 1 puff into the lungs daily. 60 each 5   gabapentin  (NEURONTIN) 100 MG capsule Take 3 capsules (300 mg total) by mouth at bedtime. 90 capsule 5   Galcanezumab-gnlm (EMGALITY) 120 MG/ML SOAJ Inject 120 mg into the skin every 30 (thirty) days. 1.12 mL 5   guaiFENesin (MUCINEX) 600 MG 12 hr tablet Take 2 tablets (1,200 mg total) by mouth 2 (two) times daily as needed for cough or to loosen phlegm. (Patient taking differently: Take 1,200 mg by mouth 2 (two) times daily.)     metFORMIN (GLUCOPHAGE) 500 MG tablet TAKE 1 TABLET(500 MG) BY MOUTH TWICE DAILY WITH A MEAL 180 tablet 1   Multiple Vitamins-Minerals (MULTIVITAMIN GUMMIES ADULT PO) Take 2 each by mouth daily.     Respiratory Therapy Supplies (FLUTTER) DEVI Use as directed (Patient taking differently: as needed. Use as directed) 1 each 0   Rimegepant Sulfate (NURTEC) 75 MG TBDP Take 75 mg by mouth daily as needed. For migraines. Take as close to onset of migraine as possible. One daily maximum. 10 tablet 6   telmisartan (MICARDIS) 20 MG tablet TAKE 1 TABLET(20 MG) BY MOUTH DAILY 90 tablet 1   No current facility-administered medications on file prior to visit.    Past Medical History:  Diagnosis Date   Abnormal liver function test    Bronchiectasis        Dyspnea    Fibromuscular dysplasia ([facility])    Fibromyalgia    Heart murmur    ILD (interstitial lung disease) ([facility])    Joint pain    Menorrhagia    MIGRAINE HEADACHE    MVP (mitral valve prolapse)    Osteoarthritis    Polymyositis ([facility])    Dr [name]; chronic MTX   Pulmonary fibrosis (H[facility])    Rheumatoid arthritis ([place])    Sleep apnea    Vitamin B 12 deficiency    Vitamin D deficiency     Past Surgical History:  Procedure Laterality Date   ablation uterine  2010   CARDIAC CATHETERIZATION  2002   normal   DILATION AND CURETTAGE OF UTERUS  08-31-08   Dr [name]   IR [place] SEL COM CAROTID INNOMINATE BILAT MOD SED  09/18/2018   IR ANGIO VERTEBRAL SEL VERTEBRAL BILAT MOD SED  09/18/2018   IR US GUIDE  VASC ACCESS RIGHT  09/18/2018    Social History   Socioeconomic History   Marital status: Married    Spouse name: Shiela Bruns   Number of children: 1   Years of education: Not on file   Highest education level: High school graduate  Occupational History   Occupation: Compliant and Injury Clerk  Tobacco Use   Smoking status: Never   Smokeless tobacco: Never   Tobacco comments:    Married, lives with spouse. 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  Vaping Use   Vaping Use: Never used  Substance and Sexual  Activity   Alcohol use: Not Currently    Comment: maybe a wine cooler once every other year   Drug use: Never   Sexual activity: Yes    Birth control/protection: Surgical  Other Topics Concern   Not on file  Social History Narrative   Exercise: trying to walk - limited by fatigue   Lives at home with husband    Right handed   Caffeine: none    Social Determinants of Health   Financial Resource Strain: Not on file  Food Insecurity: Not on file  Transportation Needs: Not on file  Physical Activity: Not on file  Stress: Not on file  Social Connections: Not on file    Family History  Problem Relation Age of Onset   Diabetes Mother    Fibromyalgia Mother    Ulcers Mother    Heart failure Mother    Thyroid disease Mother    Obesity Mother    Multiple myeloma Father    Hypertension Father    Stroke Father    Lupus Sister    Other Sister        abdominal adhesions resulting in bowel obstruction   Migraines Sister    Heart disease Brother        bypass surgery   Rheum arthritis Sister    Multiple myeloma Sister    Hypertension Sister    Heart attack Sister    Diabetes Sister    Hypertension Sister    Rheum arthritis Sister    Diabetes Sister    Diabetes Brother    Headache Other        siblings with headaches but not diagnosed as migraines    Review of Systems  Respiratory:  Positive for shortness of breath.    Cardiovascular:  Positive for chest pain and palpitations.  Psychiatric/Behavioral:  Positive for decreased concentration and sleep disturbance. The patient is nervous/anxious.       Objective:   Vitals:   09/27/21 1528  BP: (!) 142/72  Pulse: 89  Temp: 98.5 F (36.9 C)  SpO2: 98%   BP Readings from Last 3 Encounters:  09/27/21 (!) 142/72  09/06/21 128/80  08/21/21 136/70   Wt Readings from Last 3 Encounters:  09/27/21 291 lb 9.6 oz (132.3 kg)  09/06/21 283 lb (128.4 kg)  08/21/21 286 lb (129.7 kg)   Body mass index is 44.34 kg/m.   Physical Exam Constitutional:      General: She is not in acute distress.    Appearance: Normal appearance.     Comments: Crying intermittently  HENT:     Head: Normocephalic.  Skin:    General: Skin is warm and dry.  Neurological:     Mental Status: She is alert and oriented to person, place, and time.  Psychiatric:        Behavior: Behavior normal.        Thought Content: Thought content normal.        Judgment: Judgment normal.     Comments: Anxious most mildly depressed mood          Assessment & Plan:    See Problem List for Assessment and Plan of chronic medical problems.

## 2021-09-27 ENCOUNTER — Encounter: Payer: Self-pay | Admitting: Internal Medicine

## 2021-09-27 ENCOUNTER — Ambulatory Visit: Payer: Federal, State, Local not specified - PPO | Admitting: Internal Medicine

## 2021-09-27 ENCOUNTER — Other Ambulatory Visit: Payer: Self-pay

## 2021-09-27 DIAGNOSIS — F419 Anxiety disorder, unspecified: Secondary | ICD-10-CM

## 2021-09-27 DIAGNOSIS — I10 Essential (primary) hypertension: Secondary | ICD-10-CM

## 2021-09-27 MED ORDER — DULOXETINE HCL 30 MG PO CPEP: 30.0000 mg | ORAL_CAPSULE | Freq: Every day | ORAL | 5 refills | Status: DC

## 2021-09-27 NOTE — Assessment & Plan Note (Addendum)
Subacute Significant anxiety with panic attacks Experiencing chest tightness, difficulty breathing, feeling shaky inside, feeling hot/cold, palpitations, difficulty concentrating, difficulty sleeping Having significant difficulty functioning-some of the day-to-day activities at home are difficult She has been on vacation and is still having difficulty-needs to continue to be out of work for now [employer] Cymbalta 30 mg daily-discussed possible side effects She has started to see a therapist-ideally would be good for her to see him more often than once a week or once every other week Continue natural ways of helping with anxiety-currently using essential oils, recommended deep breathing, she has good social support Follow-up in 4 weeks, sooner if needed If she cannot take time off with vacation-we will need FMLA

## 2021-09-27 NOTE — Assessment & Plan Note (Signed)
Chronic Blood pressure reasonably controlled here today Anxiety is increasing her blood pressure Continue to monitor BP, but will work on getting anxiety better controlled Continue current medications-telmisartan 20 mg daily

## 2021-09-27 NOTE — Patient Instructions (Addendum)
° ° °  Medications changes include :   Cymbalta ( duloxetine) 30 mg daily   Your prescription(s) have been sent to your pharmacy.       Return in about 4 weeks (around 10/25/2021) for follow up.

## 2021-09-28 ENCOUNTER — Ambulatory Visit: Payer: Federal, State, Local not specified - PPO | Admitting: Neurology

## 2021-09-28 DIAGNOSIS — J471 Bronchiectasis with (acute) exacerbation: Secondary | ICD-10-CM | POA: Diagnosis not present

## 2021-09-28 DIAGNOSIS — G4733 Obstructive sleep apnea (adult) (pediatric): Secondary | ICD-10-CM | POA: Diagnosis not present

## 2021-10-02 NOTE — Progress Notes (Signed)
Office Visit Note  Patient: Jill Harper             Date of Birth: 03-23-1966           MRN: [id number]             PCP: Binnie Rail, MD Referring: Binnie Rail, MD Visit Date: 10/03/2021 Occupation: @GUAROCC @  Subjective:  Fibromyalgia flare   History of Present Illness: Jill Harper is a 56 y.o. female with history of seropositive rheumatoid arthritis, ILD, and osteoarthritis.  Patient is on Enbrel 50 mg sq injections once weekly and Imuran 100 mg daily.  She denies any recent rheumatoid arthritis flares. She has ongoing morning stiffness in both hands and both feet. She denies any joint swelling currently.  She presents today experiencing a fibromyalgia flare.  She has been under a tremendous amount of stress due to her workload.  She is currently out of work to focus on herself and to better manage her stress.  She has been having significant brain fog which has been interfering with her activities of daily living.  She was recently started on cymbalta to help manage her pain and level of anxiety.  She has been tolerating Cymbalta without any side effects.  She is experiencing trapezius muscle tension and tenderness bilaterally.  She is experiencing muscle spasms intermittently. She has noticed some improvement in her shortness of breath since switching from [facility] to Trelegy.  She has an upcoming appointment with Dr. Michela Pitcher on 10/11/2021.   Activities of Daily Living:  Patient reports morning stiffness for 5-7 minutes.   Patient Reports nocturnal pain.  Difficulty dressing/grooming: Reports Difficulty climbing stairs: Reports Difficulty getting out of chair: Reports Difficulty using hands for taps, buttons, cutlery, and/or writing: Reports  Review of Systems  Constitutional:  Positive for fatigue.  HENT:  Positive for mouth dryness. Negative for mouth sores and nose dryness.   Eyes:  Positive for pain, itching and dryness. Negative for visual disturbance.  Respiratory:  Positive  for shortness of breath. Negative for cough and hemoptysis.   Cardiovascular:  Negative for chest pain, palpitations, hypertension and swelling in legs/feet.  Gastrointestinal:  Negative for blood in stool, constipation and diarrhea.  Endocrine: Negative for increased urination.  Genitourinary:  Negative for difficulty urinating and painful urination.  Musculoskeletal:  Positive for joint pain, joint pain, joint swelling, myalgias, morning stiffness, muscle tenderness and myalgias. Negative for muscle weakness.  Skin:  Positive for color change. Negative for pallor, rash, hair loss, nodules/bumps, redness, skin tightness, ulcers and sensitivity to sunlight.  Allergic/Immunologic: Positive for susceptible to infections.  Neurological:  Positive for headaches and weakness.  Hematological:  Positive for bruising/bleeding tendency. Negative for swollen glands.  Psychiatric/Behavioral:  Positive for confusion (Brain fog). Negative for depressed mood and sleep disturbance. The patient is not nervous/anxious.    PMFS History:  Patient Active Problem List   Diagnosis Date Noted   Anxiety 09/06/2021   Tingling 06/28/2021   Occipital neuralgia of left side 01/06/2020   Bilateral carpal tunnel syndrome 07/21/2019   Costochondritis 03/16/2019   Rib pain on right side 03/16/2019   Insulin resistance 11/27/2018   Fibromuscular dysplasia ([place]) 10/17/2018   Hypertension 10/17/2018   Bronchiectasis with (acute) exacerbation ([place]) 10/08/2018   OSA (obstructive sleep apnea) 08/20/2018   Rheumatoid arthritis ([place]) 04/17/2018   ILD (interstitial lung disease) ([place]) 04/17/2018   Dyspnea 04/01/2018   Bronchiectasis ([place]) 09/02/2017   LVH (left ventricular hypertrophy), mild 06/02/2017  Mitral regurgitation 05/22/2017   Jo-1 antibody positive 01/29/2017   Primary osteoarthritis of both feet 01/29/2017   Primary osteoarthritis of both hands 01/29/2017   Left shoulder tendonitis  01/17/2017   Vitamin D deficiency 04/26/2016   Cough 07/18/2015   MVP (mitral valve prolapse)    Erythema nodosum    INCONTINENCE, URGE 05/26/2010   Migraine headache 02/15/2010   Fibromyalgia 06/20/2009   Polymyositis ([place]) 02/11/2008   BRONCHIECTASIS 02/10/2008    Past Medical History:  Diagnosis Date   Abnormal liver function test    Bronchiectasis        Dyspnea    Fibromuscular dysplasia (HCC)    Fibromyalgia    Heart murmur    ILD (interstitial lung disease) ([place])    Joint pain    Menorrhagia    MIGRAINE HEADACHE    MVP (mitral valve prolapse)    Osteoarthritis    Polymyositis (HCC)    Dr [name]; chronic MTX   Pulmonary fibrosis ([place])    Rheumatoid arthritis ([place])    Sleep apnea    Vitamin B 12 deficiency    Vitamin D deficiency     Family History  Problem Relation Age of Onset   Diabetes Mother    Fibromyalgia Mother    Ulcers Mother    Heart failure Mother    Thyroid disease Mother    Obesity Mother    Multiple myeloma Father    Hypertension Father    Stroke Father    Lupus Sister    Other Sister        abdominal adhesions resulting in bowel obstruction   Migraines Sister    Heart disease Brother        bypass surgery   Rheum arthritis Sister    Multiple myeloma Sister    Hypertension Sister    Heart attack Sister    Diabetes Sister    Hypertension Sister    Rheum arthritis Sister    Diabetes Sister    Diabetes Brother    Headache Other        siblings with headaches but not diagnosed as migraines   Past Surgical History:  Procedure Laterality Date   ablation uterine  2010   CARDIAC CATHETERIZATION  2002   normal   DILATION AND CURETTAGE OF UTERUS  08-31-08   Dr [name]   IR [place] SEL COM CAROTID INNOMINATE BILAT MOD SED  09/18/2018   IR ANGIO VERTEBRAL SEL VERTEBRAL BILAT MOD SED  09/18/2018   IR US GUIDE VASC ACCESS RIGHT  09/18/2018   Social History    Social History Narrative   Exercise: trying to walk - limited by fatigue   Lives at home with husband    Right handed   Caffeine: none    Immunization History  Administered Date(s) Administered   Influenza Split 04/10/2011, 06/18/2012, 05/06/2013   Influenza Whole 05/19/2008, 05/15/2009, 06/01/2010   Influenza,inj,Quad PF,6+ Mos 04/20/2014, 05/26/2015, 05/31/2016, 04/05/2017, 06/16/2018, 04/22/2019, 04/22/2020   Influenza-Unspecified 04/11/2021   PFIZER(Purple Top)SARS-COV-2 Vaccination 10/27/2019, 11/19/2019   Pneumococcal Conjugate-13 01/29/2017   Pneumococcal Polysaccharide-23 08/07/2007, 06/16/2018   Td 07/12/2009   Tdap 01/06/2020     Objective: Vital Signs: BP 133/84 (BP Location: Left Arm, Patient Position: Sitting, Cuff Size: Large)    Pulse 77    Ht 5' 8"  (1.727 m)    Wt 294 lb (133.4 kg)    BMI 44.70 kg/m    Physical Exam Vitals and nursing note reviewed.  Constitutional:  Appearance: She is well-developed.  HENT:     Head: Normocephalic and atraumatic.  Eyes:     Conjunctiva/sclera: Conjunctivae normal.  Cardiovascular:     Rate and Rhythm: Normal rate and regular rhythm.     Heart sounds: Normal heart sounds.  Pulmonary:     Effort: Pulmonary effort is normal.     Breath sounds: Normal breath sounds.  Abdominal:     General: Bowel sounds are normal.     Palpations: Abdomen is soft.  Musculoskeletal:     Cervical back: Normal range of motion.  Lymphadenopathy:     Cervical: No cervical adenopathy.  Skin:    General: Skin is warm and dry.     Capillary Refill: Capillary refill takes less than 2 seconds.  Neurological:     Mental Status: She is alert and oriented to person, place, and time.  Psychiatric:        Behavior: Behavior normal.     Musculoskeletal Exam: Generalized hyperalgesia and positive tender points.  C-spine, thoracic spine, and lumbar spine good ROM.  Trapezius muscle tension and tenderness bilaterally.   Shoulder joints,  elbow joints, wrist joints, MCPs, PIPs, and DIPs good ROM with no synovitis.  Complete fist formation bilaterally.  Hip joints, knee joints, and ankle joints good ROM with no discomfort.  No warmth or effusion of knee joints.  No tenderness or swelling of ankle joints.  CDAI Exam: CDAI Score: -- Patient Global: --; Provider Global: -- Swollen: --; Tender: -- Joint Exam 10/03/2021   No joint exam has been documented for this visit   There is currently no information documented on the homunculus. Go to the Rheumatology activity and complete the homunculus joint exam.  Investigation: No additional findings.  Imaging: No results found.  Recent Labs: Lab Results  Component Value Date   WBC 8.6 07/11/2021   HGB 13.0 07/11/2021   PLT 259.0 07/11/2021   NA 140 07/11/2021   K 4.0 07/11/2021   CL 106 07/11/2021   CO2 27 07/11/2021   GLUCOSE 108 (H) 07/11/2021   BUN 9 07/11/2021   CREATININE 0.67 07/11/2021   BILITOT 0.5 07/11/2021   ALKPHOS 60 07/11/2021   AST 13 07/11/2021   ALT 9 07/11/2021   PROT 7.6 07/11/2021   ALBUMIN 4.1 07/11/2021   CALCIUM 9.5 07/11/2021   GFRAA 114 01/18/2021   QFTBGOLDPLUS NEGATIVE 01/18/2021    Speciality Comments: Prior therapy includes: MTX (d/c due to ILD progresssion)  Procedures:  Trigger Point Inj  Date/Time: 10/03/2021 8:55 AM Performed by: Ofilia Neas, PA-C Authorized by: Ofilia Neas, PA-C   Consent Given by:  Patient Site marked: the procedure site was marked   Timeout: prior to procedure the correct patient, procedure, and site was verified   Indications:  Pain Total # of Trigger Points:  2 Location: neck   Needle Size:  27 G Approach:  Dorsal Medications #1:  0.5 mL lidocaine 1 %; 10 mg triamcinolone acetonide 40 MG/ML Medications #2:  0.5 mL lidocaine 1 %; 10 mg triamcinolone acetonide 40 MG/ML Patient tolerance:  Patient tolerated the procedure well with no immediate complications Allergies: Sulfonamide derivatives         Assessment / Plan:     Visit Diagnoses: Rheumatoid arthritis involving multiple sites with positive rheumatoid factor (HCC) - +RF, +CCP: She has no synovitis on examination today.  She is not exhibiting any signs or symptoms of a rheumatoid arthritis flare.  She is clinically doing well on Enbrel 50  mg sq injections once weekly and Imuran 100 mg daily.  She is currently having a fibromyalgia flare which is driving her discomfort and fatigue.  Discussed the importance of stress management, good sleep hygiene, and regular exercise.  She will remain on Enbrel and Imuran as prescribed.  She was advised to notify us if she develops increased joint pain or joint swelling.  She plans on keeping her follow-up visit in May 2023 but will notify us if she needs a sooner follow-up.  Polymyositis ([place]) - Dx by Dr. Charlestine Night 2007, CK 801, Jo-1 positive: She is not exhibiting any signs or symptoms of a polymyositis flare.  She has no muscle weakness at this time.  She is experiencing increased myalgias secondary to fibromyalgia.  She is clinically doing well taking Imuran as prescribed.  No medication changes will be made at this time.  CK within normal limits on 08/03/2021.  Patient declined to have updated lab work today but will return once she is more hydrated.  We will continue to monitor lab work every 3 months.- Plan: CK  High risk medication use - Enbrel 50 mg sq injections once weekly (02/18/20-1st injection). Imuran 100 mg by mouth daily. d/c MTX due to progressioin of ILD. -  CBC and CMP drawn on 07/11/2021.  Orders for CBC and CMP released today.  Her next lab work will be due at the end of May and every 3 months to monitor for toxicity.  TB Gold negative on 01/18/2021.  Future order for TB gold placed today.Plan: COMPLETE METABOLIC PANEL WITH GFR, CBC with Differential/Platelet, QuantiFERON-TB Gold Plus She has not had any recent infections. Discussed the importance of holding Enbrel and Imuran if she  develops signs or symptoms of an infection and to resume once the infection has completely cleared.  Screening for tuberculosis -Future order for TB gold was placed today.  Plan: QuantiFERON-TB Gold Plus  Jo-1 antibody positive  ILD (interstitial lung disease) ([place]) - Examined by [name] on 05/25/20.  PFT and 6-minute walk test updated on 03/22/20. High resolution CT on 03/01/20.  Chest x-ray update on 08/21/2021: Chronic moderate bilateral interstitial thickening is unchanged minimally increased from prior. This likely represents chronicscarring. Minimal superimposed interstitial pulmonary edema is possible.  Patient was evaluated by [name] on 08/21/2021 and the office visit note was reviewed today in the office.  The patient was switched from [facility] to Trelegy at that time to manage progressive symptoms.  She has started to notice some improvements in her SOB. She has follow up visit with [name] on 10/11/21.    Bronchiectasis without complication ([place]) - Followed by [facility]-[name].  She continues to have intermittent coughing but her SOB has started to improve.   Bilateral carpal tunnel syndrome: Asymptomatic at this time.  Primary osteoarthritis of both hands: She experiences intermittent pain and stiffness in both hands.  No synovitis was noted.  Complete fist formation bilaterally.  Discussed the importance of joint protection and muscle strengthening.  Primary osteoarthritis of both feet: She experiences intermittent pain and stiffness in both feet especially first thing in the morning.  No inflammation was noted on examination today.  Fibromyalgia: She presents today experiencing a fibromyalgia flare.  She has generalized hyperalgesia and positive tender points on examination.  She has trapezius muscle tension and tenderness bilaterally.  She has been experiencing muscle spasms intermittently.  Bilateral trigger point injections were performed today and the procedure notes were  completed above. Her current flare is  exacerbated by increased stress at work.  She is currently out of work to focus on self-care and to manage her stress levels.  She was started on Cymbalta about 1 week ago and is tolerating it without any side effects.  She continues to take gabapentin 300 mg at bedtime which has been helping her to sleep at night. She has had significant brain fog due to the level of stress she has been under.  Discussed the importance of good restorative sleep. Discussed the importance of stress management, regular exercise, and good sleep hygiene.  Trapezius muscle spasm: She presents today having a fibromyalgia flare.  She is having trapezius muscle tension and tenderness bilaterally.  She has been experiencing muscle spasms intermittently.  Different treatment options were discussed today.  Patient requested trigger point injections.  She tolerated the procedure well.  Procedure note was completed above.  Aftercare was discussed.  Other fatigue: She has been experiencing increased fatigue secondary to insomnia.  Discussed the importance of regular exercise and good sleep hygiene.  Raynaud's syndrome without gangrene: Not currently active.  No digital ulcerations or signs of gangrene were noted.  Other medical conditions are listed as follows:  Fibromuscular dysplasia (HCC)  Vitamin D deficiency  History of mitral valve prolapse  Erythema nodosum - No recurrence  History of migraine - She was evaluated by Dr. Jaynee Eagles on 09/22/20 who recommended increasing the frequency of ajovy injections to every 3 weeks and to try nurtec.   Orders: Orders Placed This Encounter  Procedures   Trigger Point Inj   COMPLETE METABOLIC PANEL WITH GFR   CBC with Differential/Platelet   CK   QuantiFERON-TB Gold Plus   No orders of the defined types were placed in this encounter.   Follow-Up Instructions: Return in about 3 months (around 12/31/2021) for Rheumatoid arthritis, ILD,  Osteoarthritis.   Ofilia Neas, PA-C  Note - This record has been created using Dragon software.  Chart creation errors have been sought, but may not always  have been located. Such creation errors do not reflect on  the standard of medical care.

## 2021-10-03 ENCOUNTER — Other Ambulatory Visit: Payer: Self-pay

## 2021-10-03 ENCOUNTER — Encounter: Payer: Self-pay | Admitting: Physician Assistant

## 2021-10-03 ENCOUNTER — Telehealth: Payer: Self-pay | Admitting: Internal Medicine

## 2021-10-03 ENCOUNTER — Ambulatory Visit: Payer: Federal, State, Local not specified - PPO | Admitting: Physician Assistant

## 2021-10-03 VITALS — BP 133/84 | HR 77 | Ht 68.0 in | Wt 294.0 lb

## 2021-10-03 DIAGNOSIS — M3320 Polymyositis, organ involvement unspecified: Secondary | ICD-10-CM | POA: Diagnosis not present

## 2021-10-03 DIAGNOSIS — R5383 Other fatigue: Secondary | ICD-10-CM

## 2021-10-03 DIAGNOSIS — Z111 Encounter for screening for respiratory tuberculosis: Secondary | ICD-10-CM

## 2021-10-03 DIAGNOSIS — M0579 Rheumatoid arthritis with rheumatoid factor of multiple sites without organ or systems involvement: Secondary | ICD-10-CM | POA: Diagnosis not present

## 2021-10-03 DIAGNOSIS — M62838 Other muscle spasm: Secondary | ICD-10-CM

## 2021-10-03 DIAGNOSIS — J479 Bronchiectasis, uncomplicated: Secondary | ICD-10-CM

## 2021-10-03 DIAGNOSIS — I773 Arterial fibromuscular dysplasia: Secondary | ICD-10-CM

## 2021-10-03 DIAGNOSIS — Z8669 Personal history of other diseases of the nervous system and sense organs: Secondary | ICD-10-CM

## 2021-10-03 DIAGNOSIS — M19042 Primary osteoarthritis, left hand: Secondary | ICD-10-CM

## 2021-10-03 DIAGNOSIS — L52 Erythema nodosum: Secondary | ICD-10-CM

## 2021-10-03 DIAGNOSIS — Z8679 Personal history of other diseases of the circulatory system: Secondary | ICD-10-CM

## 2021-10-03 DIAGNOSIS — R768 Other specified abnormal immunological findings in serum: Secondary | ICD-10-CM

## 2021-10-03 DIAGNOSIS — Z79899 Other long term (current) drug therapy: Secondary | ICD-10-CM | POA: Diagnosis not present

## 2021-10-03 DIAGNOSIS — J849 Interstitial pulmonary disease, unspecified: Secondary | ICD-10-CM

## 2021-10-03 DIAGNOSIS — M19072 Primary osteoarthritis, left ankle and foot: Secondary | ICD-10-CM

## 2021-10-03 DIAGNOSIS — E559 Vitamin D deficiency, unspecified: Secondary | ICD-10-CM

## 2021-10-03 DIAGNOSIS — M19071 Primary osteoarthritis, right ankle and foot: Secondary | ICD-10-CM

## 2021-10-03 DIAGNOSIS — I7300 Raynaud's syndrome without gangrene: Secondary | ICD-10-CM

## 2021-10-03 DIAGNOSIS — G5603 Carpal tunnel syndrome, bilateral upper limbs: Secondary | ICD-10-CM

## 2021-10-03 DIAGNOSIS — M797 Fibromyalgia: Secondary | ICD-10-CM

## 2021-10-03 DIAGNOSIS — M19041 Primary osteoarthritis, right hand: Secondary | ICD-10-CM

## 2021-10-03 MED ORDER — TRIAMCINOLONE ACETONIDE 40 MG/ML IJ SUSP
10.0000 mg | INTRAMUSCULAR | Status: AC | PRN
  Administered 2021-10-03: 10 mg via INTRAMUSCULAR

## 2021-10-03 MED ORDER — LIDOCAINE HCL 1 % IJ SOLN
0.5000 mL | INTRAMUSCULAR | Status: AC | PRN
  Administered 2021-10-03: .5 mL

## 2021-10-03 NOTE — Patient Instructions (Signed)
Standing Labs ?We placed an order today for your standing lab work.  ? ?Please have your standing labs drawn in June and every 3 months  ? ?If possible, please have your labs drawn 2 weeks prior to your appointment so that the provider can discuss your results at your appointment. ? ?Please note that you may see your imaging and lab results in MyChart before we have reviewed them. ?We may be awaiting multiple results to interpret others before contacting you. ?Please allow our office up to 72 hours to thoroughly review all of the results before contacting the office for clarification of your results. ? ?We have open lab daily: ?Monday through Thursday from 1:30-4:30 PM and Friday from 1:30-4:00 PM ?at the office of Dr. [name] Deveshwar, [facility].   ?Please be advised, all patients with office appointments requiring lab work will take precedent over walk-in lab work.  ?If possible, please come for your lab work on Monday and Friday afternoons, as you may experience shorter wait times. ?The office is located at [address], [address], [place], [place] [address] ?No appointment is necessary.   ?Labs are drawn by [facility]. Please bring your co-pay at the time of your lab draw.  You may receive a bill from [facility] for your lab work. ? ?Please note if you are on Hydroxychloroquine and and an order has been placed for a Hydroxychloroquine level, you will need to have it drawn 4 hours or more after your last dose. ? ?If you wish to have your labs drawn at another location, please call the office 24 hours in advance to send orders. ? ?If you have any questions regarding directions or hours of operation,  ?please call [phone].   ?As a reminder, please drink plenty of water prior to coming for your lab work. Thanks! ? ?

## 2021-10-03 NOTE — Telephone Encounter (Signed)
PT visits today and has dropped off FMLA forms to be filled out! Forms have been left in Dr.Burns' mailbox!  CB: [phone]

## 2021-10-04 NOTE — Telephone Encounter (Signed)
Form received today. Will try to complete tomorrow. ?

## 2021-10-11 ENCOUNTER — Encounter: Payer: Self-pay | Admitting: Pulmonary Disease

## 2021-10-11 ENCOUNTER — Ambulatory Visit: Payer: Federal, State, Local not specified - PPO | Admitting: Pulmonary Disease

## 2021-10-11 ENCOUNTER — Other Ambulatory Visit: Payer: Self-pay

## 2021-10-11 VITALS — BP 118/70 | HR 99 | Ht 68.0 in | Wt 290.2 lb

## 2021-10-11 DIAGNOSIS — G4733 Obstructive sleep apnea (adult) (pediatric): Secondary | ICD-10-CM | POA: Diagnosis not present

## 2021-10-11 DIAGNOSIS — J849 Interstitial pulmonary disease, unspecified: Secondary | ICD-10-CM | POA: Diagnosis not present

## 2021-10-11 DIAGNOSIS — G4734 Idiopathic sleep related nonobstructive alveolar hypoventilation: Secondary | ICD-10-CM | POA: Diagnosis not present

## 2021-10-11 DIAGNOSIS — M3320 Polymyositis, organ involvement unspecified: Secondary | ICD-10-CM

## 2021-10-11 DIAGNOSIS — M069 Rheumatoid arthritis, unspecified: Secondary | ICD-10-CM

## 2021-10-11 DIAGNOSIS — J479 Bronchiectasis, uncomplicated: Secondary | ICD-10-CM | POA: Diagnosis not present

## 2021-10-11 NOTE — Progress Notes (Signed)
[facility]  Chief Complaint  Patient presents with   Follow-up    Cpap compliance    Constitutional:  BP 118/70 (BP Location: Left Arm, Cuff Size: Normal)    Pulse 99    Ht 5' 8"  (1.727 m)    Wt 290 lb 3.2 oz (131.6 kg)    SpO2 97%    BMI 44.12 kg/m   Past Medical History:  Fibromyalgia, Migraine HA, MVP, OA, Fibromuscular dysplasia, Erythema nodosum, Vit D deficiency  Past Surgical History:  Her  has a past surgical history that includes Cardiac catheterization (2002); Dilation and curettage of uterus (08-31-08); ablation uterine (2010); IR ANGIO VERTEBRAL SEL VERTEBRAL BILAT MOD SED (09/18/2018); IR ANGIO INTRA EXTRACRAN SEL COM CAROTID INNOMINATE BILAT MOD SED (09/18/2018); and IR US Guide Vasc Access Right (09/18/2018).  Brief Summary:  Jill Harper is a 56 y.o. female with bronchiectasis in the setting of seropositive rheumatoid arthritis and polymyositis.      Subjective:   Her breathing has improved some since she started trelegy.  She is able to keep up with activities better.  She is still not back to baseline before she had respiratory infection in November 2022.  She has more trouble when she is doing things with her hands or speak, and then she gets winded quicker.  She is not having much cough.  Phlegm is clear.  Not having wheeze, chest pain, fever, or hemoptysis.  Using CPAP and oxygen at night without difficulty.  Physical Exam:   Appearance - well kempt   ENMT - no sinus tenderness, no oral exudate, no LAN, Mallampati 3 airway, no stridor  Respiratory - equal breath sounds bilaterally, no wheezing or rales  CV - s1s2 regular rate and rhythm, 2/6 flow murmur  Ext - no clubbing, no edema  Skin - no rashes  Psych - normal mood and affect     Pulmonary testing:  PFT 10/30/11 >> FEV1 3.31 (114%), FEV1% 85, TLC 6.06 (108%), DLCO 77%, no BD PFT 11/18/12 >> FEV1 3.18 (111%), FEV1% 84, TLC 5.34 (95%), DLCO 67%, no  BD PFT 12/02/15 >> FEV1 2.96 (108%), FEV1% 92, TLC 4.95 (86%), DLCO 65%, no BD PFT 04/17/18 >> FEV1 2.46 (101%), FEV1% 90, DLCO 76% PFT 02/05/19 >> FEV1 2.30 (87%), FEV1% 89, TLC 4.21 (74%), DLCO 81%, no BD PFT 02/17/20 >> FEV1 2.18 (83%), FEV1% 90, TLC 4.22 (74%), DLCO 78% PFT 07/27/21 >> FEV1 2.41 (93%), FEV1% 89, TLC 4.04 (71%), DLCO 80%  Chest Imaging:  CT chest 03/23/05 >> patchy b/l lower lung ASD with cylindrical BTX CT chest 02/17/08 >> peripheral and basilar predominant subpleural GGO CT chest 12/01/08 >> no change HRCT chest 09/07/15 >> scattered GGO HRCT chest 04/15/18 >> patchy confluent subpleural and peripheral peribronchovascular reticulation and ground-glass opacity throughout both lungs with a strong basilar gradient, traction BTX HRCT chest 03/02/20 >> widespread patchy GGO, septal thickening, mild cylindrical BTX, craniocaudal gradient with mild progression, mild air trapping  Labs:  Quantiferon gold 01/31/17 >> negative Serology 01/31/17 >> RF 29, CCP 213, Jo-1 > 8; ANA, SCL 70, SSA/SSB, ENA Sm negative Quantiferon gold 01/31/17 >> negative Ig 01/31/17 >> IgG 1992, IgA 147, IgM 136  Sleep Tests:  HST 07/28/18 >> AHI 5, SpO2 low 83% ONO with CPAP 11/05/18 >> test time 6 hrs 24 min, baseline SpO2 90%, low SpO2 80%; spent 1 hr 15 min with SpO2 < 88% Auto CPAP 09/11/21 to 10/10/21 >> used on 30 of  30 nights with average 12 hrs.  Average AHI 3.4 with median CPAP 9 and 95 th percentile CPAP 12 cm H2O  Cardiac Tests:  Echo 11/22/20 >> EF 65 to 70%, mild LVH  Social History:  She  reports that she has never smoked. She has never been exposed to tobacco smoke. She has never used smokeless tobacco. She reports that she does not currently use alcohol. She reports that she does not use drugs.  Family History:  Her family history includes Diabetes in her brother, mother, sister, and sister; Fibromyalgia in her mother; Headache in an other family member; Heart attack in her sister; Heart disease  in her brother; Heart failure in her mother; Hypertension in her father, sister, and sister; Lupus in her sister; Migraines in her sister; Multiple myeloma in her father and sister; Obesity in her mother; Other in her sister; Rheum arthritis in her sister and sister; Stroke in her father; Thyroid disease in her mother; Ulcers in her mother.     Discussion:  She has worsening dyspnea after developing respiratory infection in November 2022.  She has partial improvement in symptoms after starting trelegy in January 2023, but not back to baseline.  Assessment/Plan:   ILD with UIP pattern and Bronchiectasis in setting of RA and polymyositis. - continue trelegy with prn albuterol - continue mucinex and flutter valve prn - will arrange for HRCT chest and 6MWT to further assess - will also refer her back to pulmonary rehab  Rheumatoid arthritis with positive RF and CCP, polymyositis. - followed by Dr. Bo Merino with rheumatology - maintained on imuran and enbrel   Obstructive sleep apnea. - she is compliant with CPAP and reports benefit from therapy - she uses [employer] for her DME - continue auto CPAP 7 to 15 cm H2O   Nocturnal hypoxemia. - 2.5 liters oxygen at night with CPAP and prn during the day with dyspnea and headache - if she doesn't qualify for a POC through insurance based on 6MWT results, then she might purchase a POC independent of insurance  Migraine headache. - followed by Dr. Sarina Ill with [facility]  Time Spent Involved in Patient Care on Day of Examination:  36 minutes  Follow up:   Patient Instructions  Will arrange for 6 minute walk test and high resolution CT chest  Will arrange for referral to pulmonary rehab  Follow up in 3 months  Medication List:   Allergies as of 10/11/2021       Reactions   Sulfonamide Derivatives    REACTION: hives        Medication List        Accurate as of October 11, 2021  4:55 PM. If you have any questions,  ask your nurse or doctor.          albuterol 108 (90 Base) MCG/ACT inhaler Commonly known as: Proventil HFA Inhale 2 puffs into the lungs 4 (four) times daily as needed. Reported on 09/03/2015   albuterol (2.5 MG/3ML) 0.083% nebulizer solution Commonly known as: PROVENTIL Take 3 mLs (2.5 mg total) by nebulization every 6 (six) hours as needed for wheezing or shortness of breath.   aspirin EC 81 MG tablet Take 81 mg by mouth daily.   atenolol 25 MG tablet Commonly known as: TENORMIN TAKE 1/2 TABLET BY MOUTH DAILY AS NEEDED FOR MITRAL PROLAPSE   azaTHIOprine 50 MG tablet Commonly known as: IMURAN Take 2 tablets by mouth daily.   diclofenac Sodium 1 % Gel Commonly known  as: VOLTAREN APPLY 2 TO 4 GRAMS TO AFFECTED JOINT FOUR TIMES DAILY AS NEEDED   DULoxetine 30 MG capsule Commonly known as: CYMBALTA Take 1 capsule (30 mg total) by mouth daily.   eletriptan 20 MG tablet Commonly known as: RELPAX TAKE 1 TABLET BY MOUTH AS NEEDED FOR MIGRAINE   Emgality 120 MG/ML Soaj Generic drug: Galcanezumab-gnlm Inject 120 mg into the skin every 30 (thirty) days.   Enbrel Mini 50 MG/ML Soct Generic drug: Etanercept INSERT 1 MINI CARTRIDGE INTO AUTOINJECTOR AND INJECT 50 MG UNDER THE SKIN EVERY 7 DAYS.   Flutter Devi Use as directed What changed:  when to take this reasons to take this   gabapentin 100 MG capsule Commonly known as: NEURONTIN Take 3 capsules (300 mg total) by mouth at bedtime.   guaiFENesin 600 MG 12 hr tablet Commonly known as: Mucinex Take 2 tablets (1,200 mg total) by mouth 2 (two) times daily as needed for cough or to loosen phlegm. What changed: when to take this   metFORMIN 500 MG tablet Commonly known as: GLUCOPHAGE TAKE 1 TABLET(500 MG) BY MOUTH TWICE DAILY WITH A MEAL   MULTIVITAMIN GUMMIES ADULT PO Take 2 each by mouth daily.   Nurtec 75 MG Tbdp Generic drug: Rimegepant Sulfate Take 75 mg by mouth daily as needed. For migraines. Take as close  to onset of migraine as possible. One daily maximum.   telmisartan 20 MG tablet Commonly known as: MICARDIS TAKE 1 TABLET(20 MG) BY MOUTH DAILY   Trelegy Ellipta 100-62.5-25 MCG/ACT Aepb Generic drug: Fluticasone-Umeclidin-Vilant Inhale 1 puff into the lungs daily.   Vitamin D 50 MCG (2000 UT) Caps Take by mouth.        Signature:  Chesley Mires, MD [facility] Pager - [phone] 10/11/2021, 4:55 PM

## 2021-10-11 NOTE — Patient Instructions (Signed)
Will arrange for 6 minute walk test and high resolution CT chest ? ?Will arrange for referral to pulmonary rehab ? ?Follow up in 3 months ?

## 2021-10-12 NOTE — Telephone Encounter (Signed)
Message left for patient that paperwork ready for pick up on Monday. ? ?

## 2021-10-12 NOTE — Telephone Encounter (Signed)
Paperwork completed. Placed in Dr. Quay Burow office for signature. ?

## 2021-10-16 ENCOUNTER — Encounter (HOSPITAL_COMMUNITY): Payer: Self-pay | Admitting: *Deleted

## 2021-10-16 NOTE — Progress Notes (Signed)
Received referral from [name] for this pt to participate in pulmonary rehab with the diagnosis of ILD. Clinical review of pt follow up appt on 10/11/21 Pulmonary office note.   Reviewed progress notes in [facility]. Pt with Covid Risk Score - 3. Pt appropriate for scheduling for Pulmonary rehab.  Will forward to support staff for scheduling and verification of insurance eligibility/benefits with pt consent. Cherre Huger, BSN ?Cardiac and Pulmonary Rehab Nurse Navigator  ? ?

## 2021-10-16 NOTE — Telephone Encounter (Signed)
Paperwork completed. ? ?Copy made and sent to scan place. ? ?Original left up front with posting sheet. ? ?Patient has been contacted. ?

## 2021-10-19 DIAGNOSIS — G4733 Obstructive sleep apnea (adult) (pediatric): Secondary | ICD-10-CM | POA: Diagnosis not present

## 2021-10-19 DIAGNOSIS — J471 Bronchiectasis with (acute) exacerbation: Secondary | ICD-10-CM | POA: Diagnosis not present

## 2021-10-23 ENCOUNTER — Other Ambulatory Visit: Payer: Self-pay

## 2021-10-23 ENCOUNTER — Telehealth: Payer: Self-pay | Admitting: *Deleted

## 2021-10-23 ENCOUNTER — Ambulatory Visit (INDEPENDENT_AMBULATORY_CARE_PROVIDER_SITE_OTHER)
Admission: RE | Admit: 2021-10-23 | Discharge: 2021-10-23 | Disposition: A | Discharge status: Home / Self Care | Payer: Federal, State, Local not specified - PPO | Source: Ambulatory Visit | Attending: Pulmonary Disease | Admitting: Pulmonary Disease

## 2021-10-23 DIAGNOSIS — J849 Interstitial pulmonary disease, unspecified: Secondary | ICD-10-CM

## 2021-10-23 DIAGNOSIS — J479 Bronchiectasis, uncomplicated: Secondary | ICD-10-CM | POA: Diagnosis not present

## 2021-10-23 DIAGNOSIS — K449 Diaphragmatic hernia without obstruction or gangrene: Secondary | ICD-10-CM | POA: Diagnosis not present

## 2021-10-23 NOTE — Telephone Encounter (Signed)
Received request for [facility]. Completed this on Cover My Meds. Key: [id number]. Approved immediately by [employer] from  09/23/2021 through 10/23/2022. Faxed approval letter to pharmacy. Received a receipt of confirmation. ? ?

## 2021-10-24 DIAGNOSIS — F410 Panic disorder [episodic paroxysmal anxiety] without agoraphobia: Secondary | ICD-10-CM | POA: Diagnosis not present

## 2021-10-24 NOTE — Progress Notes (Signed)
? ? ? ? ?Subjective:  ? ? Patient ID: Jill Harper, female    DOB: October 12, 1965, 56 y.o.   MRN: [id number] ? ?This visit occurred during the SARS-CoV-2 public health emergency.  Safety protocols were in place, including screening questions prior to the visit, additional usage of staff PPE, and extensive cleaning of exam room while observing appropriate contact time as indicated for disinfecting solutions.   ? ? ?HPI ?Jill Harper is here for follow up of her chronic medical problems, including anxiety ? ?She was here 1 month ago.  She was still having significant anxiety that was not allowing her to function.  We started Cymbalta 30 mg daily and she has been taking that on a regular basis without side effects.  She is seeing her therapist - [facility]. ? ?Still holding crying in, chest tightness and quivering inside - all better.   ? ?Medications and allergies reviewed with patient and updated if appropriate. ? ?Current Outpatient Medications on File Prior to Visit  ?Medication Sig Dispense Refill  ? albuterol (PROVENTIL HFA) 108 (90 Base) MCG/ACT inhaler Inhale 2 puffs into the lungs 4 (four) times daily as needed. Reported on 09/03/2015 1 Inhaler 4  ? albuterol (PROVENTIL) (2.5 MG/3ML) 0.083% nebulizer solution Take 3 mLs (2.5 mg total) by nebulization every 6 (six) hours as needed for wheezing or shortness of breath. 120 vial 5  ? aspirin EC 81 MG tablet Take 81 mg by mouth daily.    ? atenolol (TENORMIN) 25 MG tablet TAKE 1/2 TABLET BY MOUTH DAILY AS NEEDED FOR MITRAL PROLAPSE 45 tablet 0  ? azaTHIOprine (IMURAN) 50 MG tablet Take 2 tablets by mouth daily. 60 tablet 2  ? Cholecalciferol (VITAMIN D) 50 MCG (2000 UT) CAPS Take by mouth.    ? diclofenac Sodium (VOLTAREN) 1 % GEL APPLY 2 TO 4 GRAMS TO AFFECTED JOINT FOUR TIMES DAILY AS NEEDED 400 g 2  ? DULoxetine (CYMBALTA) 30 MG capsule Take 1 capsule (30 mg total) by mouth daily. 30 capsule 5  ? eletriptan (RELPAX) 20 MG tablet TAKE 1 TABLET BY MOUTH AS NEEDED FOR MIGRAINE 10 tablet  11  ? ENBREL MINI 50 MG/ML SOCT INSERT 1 MINI CARTRIDGE INTO AUTOINJECTOR AND INJECT 50 MG UNDER THE SKIN EVERY 7 DAYS. 12 mL 0  ? Fluticasone-Umeclidin-Vilant (TRELEGY ELLIPTA) 100-62.5-25 MCG/ACT AEPB Inhale 1 puff into the lungs daily. 60 each 5  ? gabapentin (NEURONTIN) 100 MG capsule Take 3 capsules (300 mg total) by mouth at bedtime. 90 capsule 5  ? Galcanezumab-gnlm (EMGALITY) 120 MG/ML SOAJ Inject 120 mg into the skin every 30 (thirty) days. 1.12 mL 5  ? guaiFENesin (MUCINEX) 600 MG 12 hr tablet Take 2 tablets (1,200 mg total) by mouth 2 (two) times daily as needed for cough or to loosen phlegm. (Patient taking differently: Take 1,200 mg by mouth 2 (two) times daily.)    ? metFORMIN (GLUCOPHAGE) 500 MG tablet TAKE 1 TABLET(500 MG) BY MOUTH TWICE DAILY WITH A MEAL 180 tablet 1  ? Multiple Vitamins-Minerals (MULTIVITAMIN GUMMIES ADULT PO) Take 2 each by mouth daily.    ? Respiratory Therapy Supplies (FLUTTER) DEVI Use as directed (Patient taking differently: as needed. Use as directed) 1 each 0  ? Rimegepant Sulfate (NURTEC) 75 MG TBDP Take 75 mg by mouth daily as needed. For migraines. Take as close to onset of migraine as possible. One daily maximum. 10 tablet 6  ? telmisartan (MICARDIS) 20 MG tablet TAKE 1 TABLET(20 MG) BY MOUTH DAILY 90 tablet  1  ? ?No current facility-administered medications on file prior to visit.  ? ? ? ?Review of Systems ? ?   ?Objective:  ? ?Vitals:  ? 10/25/21 1534  ?BP: 130/70  ?Pulse: 78  ?Temp: 98.1 ?F (36.7 ?C)  ?SpO2: 96%  ? ?BP Readings from Last 3 Encounters:  ?10/25/21 130/70  ?10/11/21 118/70  ?10/03/21 133/84  ? ?Wt Readings from Last 3 Encounters:  ?10/25/21 289 lb (131.1 kg)  ?10/11/21 290 lb 3.2 oz (131.6 kg)  ?10/03/21 294 lb (133.4 kg)  ? ?Body mass index is 43.94 kg/m?. ? ?  ?Physical Exam ?Constitutional:   ?   General: She is not in acute distress. ?   Appearance: Normal appearance.  ?HENT:  ?   Head: Normocephalic.  ?Skin: ?   General: Skin is warm and dry.   ?Neurological:  ?   Mental Status: She is alert. Mental status is at baseline.  ?Psychiatric:     ?   Mood and Affect: Mood normal.     ?   Behavior: Behavior normal.     ?   Thought Content: Thought content normal.     ?   Judgment: Judgment normal.  ? ?   ? ?Lab Results  ?Component Value Date  ? WBC 8.6 07/11/2021  ? HGB 13.0 07/11/2021  ? HCT 39.5 07/11/2021  ? PLT 259.0 07/11/2021  ? GLUCOSE 108 (H) 07/11/2021  ? CHOL 129 07/11/2021  ? TRIG 78.0 07/11/2021  ? HDL 39.60 07/11/2021  ? [place] 74 07/11/2021  ? ALT 9 07/11/2021  ? AST 13 07/11/2021  ? NA 140 07/11/2021  ? K 4.0 07/11/2021  ? CL 106 07/11/2021  ? CREATININE 0.67 07/11/2021  ? BUN 9 07/11/2021  ? CO2 27 07/11/2021  ? TSH 6.06 (H) 07/11/2021  ? INR 1.03 09/18/2018  ? HGBA1C 5.9 07/11/2021  ? ? ? ?Assessment & Plan:  ? ? ?See Problem List for Assessment and Plan of chronic medical problems.  ? ? ?

## 2021-10-25 ENCOUNTER — Telehealth: Payer: Self-pay | Admitting: Pulmonary Disease

## 2021-10-25 ENCOUNTER — Encounter: Payer: Self-pay | Admitting: Internal Medicine

## 2021-10-25 ENCOUNTER — Other Ambulatory Visit: Payer: Self-pay

## 2021-10-25 ENCOUNTER — Ambulatory Visit: Payer: Federal, State, Local not specified - PPO | Admitting: Internal Medicine

## 2021-10-25 VITALS — BP 130/70 | HR 78 | Temp 98.1°F | Ht 68.0 in | Wt 289.0 lb

## 2021-10-25 DIAGNOSIS — F419 Anxiety disorder, unspecified: Secondary | ICD-10-CM | POA: Diagnosis not present

## 2021-10-25 DIAGNOSIS — I10 Essential (primary) hypertension: Secondary | ICD-10-CM | POA: Diagnosis not present

## 2021-10-25 NOTE — Assessment & Plan Note (Signed)
Chronic ?BP well controlled with anxiety better ?Continue telmisartan 20 mg daily ? ?

## 2021-10-25 NOTE — Assessment & Plan Note (Signed)
Subacute ?Improved, but still with anxiety ?Continue cymbalta 30 mg daily - tolerating it well w/o side effects - it is helping - she does not feel there is a need to increase the dose ?Working with [facility] from work which is really helping ? ?

## 2021-10-25 NOTE — Telephone Encounter (Signed)
HRCT chest 10/23/21 >> patchy GGO, septal thickening, small amount of subpleural reticulation, mild cylindrical bronchiectasis and peripheral bronchiolectasis. Findings have a definitive craniocaudal gradient and appear essentially unchanged compared to the prior examination (probable UIP versus fibrotic NSIP) ? ? ?Results d/w pt.  No significant change in ILD pattern.  No change to current therapy. ?

## 2021-10-25 NOTE — Patient Instructions (Addendum)
? ? ?  Medications changes include :  none  ? ? ? ?Return in about 4 weeks (around 11/22/2021) for follow up. ? ?

## 2021-10-26 DIAGNOSIS — G4733 Obstructive sleep apnea (adult) (pediatric): Secondary | ICD-10-CM | POA: Diagnosis not present

## 2021-10-26 DIAGNOSIS — J471 Bronchiectasis with (acute) exacerbation: Secondary | ICD-10-CM | POA: Diagnosis not present

## 2021-10-30 DIAGNOSIS — F410 Panic disorder [episodic paroxysmal anxiety] without agoraphobia: Secondary | ICD-10-CM | POA: Diagnosis not present

## 2021-11-02 ENCOUNTER — Other Ambulatory Visit: Payer: Self-pay | Admitting: *Deleted

## 2021-11-02 DIAGNOSIS — M3320 Polymyositis, organ involvement unspecified: Secondary | ICD-10-CM

## 2021-11-02 DIAGNOSIS — Z79899 Other long term (current) drug therapy: Secondary | ICD-10-CM | POA: Diagnosis not present

## 2021-11-03 LAB — COMPLETE METABOLIC PANEL WITH GFR
AG Ratio: 1.3 (calc) (ref 1.0–2.5)
ALT: 11 U/L (ref 6–29)
AST: 14 U/L (ref 10–35)
Albumin: 4.1 g/dL (ref 3.6–5.1)
Alkaline phosphatase (APISO): 62 U/L (ref 37–153)
BUN: 12 mg/dL (ref 7–25)
CO2: 28 mmol/L (ref 20–32)
Calcium: 9.6 mg/dL (ref 8.6–10.4)
Chloride: 107 mmol/L (ref 98–110)
Creat: 0.76 mg/dL (ref 0.50–1.03)
Globulin: 3.2 g/dL (calc) (ref 1.9–3.7)
Glucose, Bld: 100 mg/dL — ABNORMAL HIGH (ref 65–99)
Potassium: 4.2 mmol/L (ref 3.5–5.3)
Sodium: 143 mmol/L (ref 135–146)
Total Bilirubin: 0.4 mg/dL (ref 0.2–1.2)
Total Protein: 7.3 g/dL (ref 6.1–8.1)
eGFR: 92 mL/min/{1.73_m2} (ref >=60)

## 2021-11-03 LAB — CBC WITH DIFFERENTIAL/PLATELET
Absolute Monocytes: 637 cells/uL (ref 200–950)
Basophils Absolute: 39 cells/uL (ref 0–200)
Basophils Relative: 0.4 %
Eosinophils Absolute: 147 cells/uL (ref 15–500)
Eosinophils Relative: 1.5 %
HCT: 40.6 % (ref 35.0–45.0)
Hemoglobin: 13.4 g/dL (ref 11.7–15.5)
Lymphs Abs: 1303 cells/uL (ref 850–3900)
MCH: 28.2 pg (ref 27.0–33.0)
MCHC: 33 g/dL (ref 32.0–36.0)
MCV: 85.5 fL (ref 80.0–100.0)
MPV: 10.8 fL (ref 7.5–12.5)
Monocytes Relative: 6.5 %
Neutro Abs: 7673 cells/uL (ref 1500–7800)
Neutrophils Relative %: 78.3 %
Platelets: 273 10*3/uL (ref 140–400)
RBC: 4.75 10*6/uL (ref 3.80–5.10)
RDW: 12.3 % (ref 11.0–15.0)
Total Lymphocyte: 13.3 %
WBC: 9.8 10*3/uL (ref 3.8–10.8)

## 2021-11-03 LAB — CK: Total CK: 55 U/L (ref 29–143)

## 2021-11-03 NOTE — Progress Notes (Signed)
CBC and CMP WNL.  CK WNL.

## 2021-11-06 DIAGNOSIS — F410 Panic disorder [episodic paroxysmal anxiety] without agoraphobia: Secondary | ICD-10-CM | POA: Diagnosis not present

## 2021-11-08 ENCOUNTER — Telehealth (HOSPITAL_COMMUNITY): Payer: Self-pay

## 2021-11-08 ENCOUNTER — Encounter (HOSPITAL_COMMUNITY): Payer: Self-pay

## 2021-11-08 NOTE — Telephone Encounter (Signed)
Attempted to call patient in regards to Pulmonary Rehab - LM on VM Mailed letter 

## 2021-11-08 NOTE — Telephone Encounter (Signed)
Pt insurance is active and benefits verified through [employer] $30, DED 0/0 met, out of pocket $6,500/$847.73 met, co-insurance 0%. no pre-authorization required. Kimber/BCBS 11/08/2021@4 :05pm, REF# [id number] ? ?Will contact patient to see if she is interested in the Cardiac Rehab Program. If interested, patient will need to complete follow up appt. Once completed, patient will be contacted for scheduling upon review by the RN Navigator. ?

## 2021-11-14 ENCOUNTER — Telehealth (HOSPITAL_COMMUNITY): Payer: Self-pay

## 2021-11-14 DIAGNOSIS — F410 Panic disorder [episodic paroxysmal anxiety] without agoraphobia: Secondary | ICD-10-CM | POA: Diagnosis not present

## 2021-11-14 NOTE — Telephone Encounter (Signed)
Called to confirm appointment. Jill Harper confirmed appointment. Discussed proper footwear and directions to the department. Gave department number. Jill Harper voiced understanding.  ?

## 2021-11-15 ENCOUNTER — Encounter (HOSPITAL_COMMUNITY)
Admission: RE | Admit: 2021-11-15 | Discharge: 2021-11-15 | Disposition: A | Discharge status: Home / Self Care | Payer: Federal, State, Local not specified - PPO | Source: Ambulatory Visit | Attending: Pulmonary Disease | Admitting: Pulmonary Disease

## 2021-11-15 ENCOUNTER — Encounter (HOSPITAL_COMMUNITY): Payer: Self-pay

## 2021-11-15 VITALS — BP 116/64 | HR 80 | Ht 68.0 in | Wt 289.2 lb

## 2021-11-15 DIAGNOSIS — J849 Interstitial pulmonary disease, unspecified: Secondary | ICD-10-CM | POA: Insufficient documentation

## 2021-11-15 NOTE — Progress Notes (Signed)
Pulmonary Individual Treatment Plan ? ?Patient Details  ?Name: Jill Harper ?MRN: [id number] ?Date of Birth: 04-27-1966 ?Referring Provider:   ?Flowsheet Row Pulmonary Rehab Walk Test from 11/15/2021 in [facility]  ?Referring Provider Sood  ? ?  ? ? ?Initial Encounter Date:  ?Flowsheet Row Pulmonary Rehab Walk Test from 11/15/2021 in [facility]  ?Date 11/15/21  ? ?  ? ? ?Visit Diagnosis: ILD (interstitial lung disease) ([place]) ? ?Patient's Home Medications on Admission:  ? ?Current Outpatient Medications:  ?  albuterol (PROVENTIL HFA) 108 (90 Base) MCG/ACT inhaler, Inhale 2 puffs into the lungs 4 (four) times daily as needed. Reported on 09/03/2015, Disp: 1 Inhaler, Rfl: 4 ?  albuterol (PROVENTIL) (2.5 MG/3ML) 0.083% nebulizer solution, Take 3 mLs (2.5 mg total) by nebulization every 6 (six) hours as needed for wheezing or shortness of breath., Disp: 120 vial, Rfl: 5 ?  aspirin EC 81 MG tablet, Take 81 mg by mouth daily., Disp: , Rfl:  ?  atenolol (TENORMIN) 25 MG tablet, TAKE 1/2 TABLET BY MOUTH DAILY AS NEEDED FOR MITRAL PROLAPSE, Disp: 45 tablet, Rfl: 0 ?  azaTHIOprine (IMURAN) 50 MG tablet, Take 2 tablets by mouth daily., Disp: 60 tablet, Rfl: 2 ?  Cholecalciferol (VITAMIN D) 50 MCG (2000 UT) CAPS, Take by mouth., Disp: , Rfl:  ?  diclofenac Sodium (VOLTAREN) 1 % GEL, APPLY 2 TO 4 GRAMS TO AFFECTED JOINT FOUR TIMES DAILY AS NEEDED, Disp: 400 g, Rfl: 2 ?  DULoxetine (CYMBALTA) 30 MG capsule, Take 1 capsule (30 mg total) by mouth daily., Disp: 30 capsule, Rfl: 5 ?  eletriptan (RELPAX) 20 MG tablet, TAKE 1 TABLET BY MOUTH AS NEEDED FOR MIGRAINE, Disp: 10 tablet, Rfl: 11 ?  ENBREL MINI 50 MG/ML SOCT, INSERT 1 MINI CARTRIDGE INTO AUTOINJECTOR AND INJECT 50 MG UNDER THE SKIN EVERY 7 DAYS., Disp: 12 mL, Rfl: 0 ?  Fluticasone-Umeclidin-Vilant (TRELEGY ELLIPTA) 100-62.5-25 MCG/ACT AEPB, Inhale 1 puff into the lungs daily., Disp: 60 each, Rfl: 5 ?  gabapentin  (NEURONTIN) 100 MG capsule, Take 3 capsules (300 mg total) by mouth at bedtime., Disp: 90 capsule, Rfl: 5 ?  Galcanezumab-gnlm (EMGALITY) 120 MG/ML SOAJ, Inject 120 mg into the skin every 30 (thirty) days., Disp: 1.12 mL, Rfl: 5 ?  guaiFENesin (MUCINEX) 600 MG 12 hr tablet, Take 2 tablets (1,200 mg total) by mouth 2 (two) times daily as needed for cough or to loosen phlegm. (Patient taking differently: Take 1,200 mg by mouth 2 (two) times daily.), Disp: , Rfl:  ?  metFORMIN (GLUCOPHAGE) 500 MG tablet, TAKE 1 TABLET(500 MG) BY MOUTH TWICE DAILY WITH A MEAL, Disp: 180 tablet, Rfl: 1 ?  Multiple Vitamins-Minerals (MULTIVITAMIN GUMMIES ADULT PO), Take 2 each by mouth daily., Disp: , Rfl:  ?  Respiratory Therapy Supplies (FLUTTER) DEVI, Use as directed (Patient taking differently: as needed. Use as directed), Disp: 1 each, Rfl: 0 ?  Rimegepant Sulfate (NURTEC) 75 MG TBDP, Take 75 mg by mouth daily as needed. For migraines. Take as close to onset of migraine as possible. One daily maximum., Disp: 10 tablet, Rfl: 6 ?  telmisartan (MICARDIS) 20 MG tablet, TAKE 1 TABLET(20 MG) BY MOUTH DAILY, Disp: 90 tablet, Rfl: 1 ? ?Past Medical History: ?Past Medical History:  ?Diagnosis Date  ? Abnormal liver function test   ? Bronchiectasis   ?    ? Dyspnea   ? Fibromuscular dysplasia ([place])   ? Fibromyalgia   ? Heart murmur   ?  ILD (interstitial lung disease) ([place])   ? Joint pain   ? Menorrhagia   ? MIGRAINE HEADACHE   ? MVP (mitral valve prolapse)   ? Osteoarthritis   ? Polymyositis ([place])   ? Dr [name]; chronic MTX  ? Pulmonary fibrosis ([place])   ? Rheumatoid arthritis ([place])   ? Sleep apnea   ? Vitamin B 12 deficiency   ? Vitamin D deficiency   ? ? ?Tobacco Use: ?Social History  ? ?Tobacco Use  ?Smoking Status Never  ? Passive exposure: Never  ?Smokeless Tobacco Never  ?Tobacco Comments  ? Married, lives with spouse. 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 ? ?Labs: ?Review Flowsheet   ? ?  ?   Latest Ref Rng & Units 08/28/2012 04/24/2016 10/09/2018 10/12/2019  ?Labs for ITP Cardiac and Pulmonary Rehab  ?Cholestrol 0 - 200 mg/dL 141   110   141   154    ?LDL (calc) 0 - 99 mg/dL 74   59   74   83    ?HDL-C >39.00 mg/dL 57.10   39.20   44   61    ?Trlycerides 0.0 - 149.0 mg/dL 51.0   59.0   116   43    ?Hemoglobin A1c 4.6 - 6.5 %  4.8   5.5   5.5    ? ?  07/11/2021  ?Labs for ITP Cardiac and Pulmonary Rehab  ?Cholestrol 129    ?LDL (calc) 74    ?HDL-C 39.60    ?Trlycerides 78.0    ?Hemoglobin A1c 5.9    ?  ? ? Multiple values from one day are sorted in reverse-chronological order  ?  ?  ? ? ?Capillary Blood Glucose: ?No results found for: GLUCAP ? ? ?Pulmonary Assessment Scores: ? Pulmonary Assessment Scores   ? ? [place] Name 11/15/21 1122  ?  ?  ?  ? ADL UCSD  ? ADL Phase Entry    ? SOB Score total 70    ?  ? CAT Score  ? CAT Score 28    ?  ? mMRC Score  ? mMRC Score 4    ? ?  ?  ? ?  ? ?UCSD: ?Self-administered rating of dyspnea associated with activities of daily living (ADLs) ?6-point scale (0 = "not at all" to 5 = "maximal or unable to do because of breathlessness")  ?Scoring Scores range from 0 to 120.  Minimally important difference is 5 units ? ?CAT: ?CAT can identify the health impairment of COPD patients and is better correlated with disease progression.  ?CAT has a scoring range of zero to 40. The CAT score is classified into four groups of low (less than 10), medium (10 - 20), high (21-30) and very high (31-40) based on the impact level of disease on health status. A CAT score over 10 suggests significant symptoms.  A worsening CAT score could be explained by an exacerbation, poor medication adherence, poor inhaler technique, or progression of COPD or comorbid conditions.  ?CAT MCID is 2 points ? ?mMRC: ?mMRC (Modified Medical Research Council) Dyspnea Scale is used to assess the degree of baseline functional disability in patients of respiratory disease due to dyspnea. ?No minimal important difference is  established. A decrease in score of 1 point or greater is considered a positive change.  ? ?Pulmonary Function Assessment: ? Pulmonary Function Assessment - 11/15/21 1131   ? ?  ? Breath  ? Bilateral Breath  Sounds Clear   ? Shortness of Breath Yes;Fear of Shortness of Breath;Limiting activity;Panic with Shortness of Breath   ? ?  ?  ? ?  ? ? ?Exercise Target Goals: ?Exercise Program Goal: ?Individual exercise prescription set using results from initial 6 min walk test and THRR while considering  patient?s activity barriers and safety.  ? ?Exercise Prescription Goal: ?Initial exercise prescription builds to 30-45 minutes a day of aerobic activity, 2-3 days per week.  Home exercise guidelines will be given to patient during program as part of exercise prescription that the participant will acknowledge. ? ?Activity Barriers & Risk Stratification: ? Activity Barriers & Cardiac Risk Stratification - 11/15/21 1112   ? ?  ? Activity Barriers & Cardiac Risk Stratification  ? Activity Barriers Arthritis;Shortness of Breath;Fibromyalgia;Joint Problems   RA  ? Cardiac Risk Stratification Low   ? ?  ?  ? ?  ? ? ?6 Minute Walk: ? 6 Minute Walk   ? ? [place] Name 11/15/21 1257  ?  ?  ?  ? 6 Minute Walk  ? Phase Initial    ? Distance 1230 feet    ? Walk Time 6 minutes    ? # of Rest Breaks 0    ? MPH 2.33    ? METS 3.19    ? RPE 17    ? Perceived Dyspnea  3    ? VO2 Peak 11.15    ? Symptoms No    ? Resting HR 80 bpm    ? Resting BP 116/64    ? Resting Oxygen Saturation  97 %    ? Exercise Oxygen Saturation  during 6 min walk 95 %    ? Max Ex. HR 144 bpm    ? Max Ex. BP 148/70    ? 2 Minute Post BP 130/70    ?  ? Interval HR  ? 1 Minute HR 112    ? 2 Minute HR 110    ? 3 Minute HR 115    ? 4 Minute HR 133    ? 5 Minute HR 141    ? 6 Minute HR 144    ? 2 Minute Post HR 103    ? Interval Heart Rate? Yes    ?  ? Interval Oxygen  ? Interval Oxygen? Yes    ? Baseline Oxygen Saturation % 97 %    ? 1 Minute Oxygen Saturation % 96 %    ? 1  Minute Liters of Oxygen 0 L    ? 2 Minute Oxygen Saturation % 95 %    ? 2 Minute Liters of Oxygen 0 L    ? 3 Minute Oxygen Saturation % 97 %    ? 3 Minute Liters of Oxygen 0 L    ? 4 Minute Oxygen Saturation % 97

## 2021-11-15 NOTE — Progress Notes (Signed)
Jill Harper 56 y.o. female ?Pulmonary Rehab Orientation Note ?This patient who was referred to Pulmonary Rehab by [name] with the diagnosis of ILD arrived today in Cardiac and Pulmonary Rehab. She  arrived with ambulatory normal gait. She  does not carry portable oxygen. [employer] is the provider for their DME. Per pt, Jill Harper uses oxygen at night when going to sleep. Color good, skin warm and dry. Patient is oriented to time and place. Patient's medical history, psychosocial health, and medications reviewed. Psychosocial assessment reveals pt lives with spouse. Jill Harper is currently disabled. Pt hobbies include reading. Pt reports her stress level is low. Areas of stress/anxiety include health and family . Pt does exhibit signs of depression. Signs of depression include hopelessness and difficulty maintaining sleep. PHQ2/9 score 2/10. Pt is currently seeing a counselor and stated that this is benefiting her. Jill Harper shows good  coping skills with positive outlook on life. Offered emotional support and reassurance. Will continue to monitor. Physical assessment performed by Jill Small, RN. Please see their orientation physical assessment note. Jill Harper reports she does take medications as prescribed. Patient states she follows a regular  diet. The patient has been trying to lose weight through a healthy diet and exercise program.. Pt's weight will be monitored closely. Demonstration and practice of PLB using pulse oximeter. Jill Harper able to return demonstration satisfactorily. Safety and hand hygiene in the exercise area reviewed with patient. Jill Harper voices understanding of the information reviewed. Department expectations discussed with patient and achievable goals were set. The patient shows enthusiasm about attending the program and we look forward to working with Jill Harper. Jill Harper completed a 6 min walk test today and is scheduled to begin exercise on 11/21/21 at 10:15am.  ? ?1040-1200 ?Jill Plumber, MS, ACSM-CEP ?  ?

## 2021-11-17 NOTE — Progress Notes (Signed)
Pulmonary Rehab Orientation Physical Assessment Note ? ?Physical assessment reveals  Pt is alert and oriented x 3.  Heart rate is normal with murmer history of mitral valve prolapse, breath sounds clear to auscultation, no wheezes, rales, or rhonchi. Reports non-productive cough. Bowel sounds present.  Pt denies abdominal discomfort, nausea, vomiting or diarrhea. Grip strength equal, strong. Distal pulses palpable; trace swelling to lower extremities.Cherre Huger, BSN ?Cardiac and Pulmonary Rehab Nurse Navigator  ? ? ?

## 2021-11-19 DIAGNOSIS — J471 Bronchiectasis with (acute) exacerbation: Secondary | ICD-10-CM | POA: Diagnosis not present

## 2021-11-19 DIAGNOSIS — G4733 Obstructive sleep apnea (adult) (pediatric): Secondary | ICD-10-CM | POA: Diagnosis not present

## 2021-11-21 ENCOUNTER — Other Ambulatory Visit: Payer: Self-pay | Admitting: Adult Health

## 2021-11-21 ENCOUNTER — Encounter (HOSPITAL_COMMUNITY): Payer: Federal, State, Local not specified - PPO

## 2021-11-21 ENCOUNTER — Telehealth (HOSPITAL_COMMUNITY): Payer: Self-pay

## 2021-11-21 DIAGNOSIS — F410 Panic disorder [episodic paroxysmal anxiety] without agoraphobia: Secondary | ICD-10-CM | POA: Diagnosis not present

## 2021-11-21 NOTE — Progress Notes (Signed)
? ? ? ? ?Subjective:  ? ? Patient ID: Jill Harper, female    DOB: 1965/08/29, 56 y.o.   MRN: [id number] ? ?This visit occurred during the SARS-CoV-2 public health emergency.  Safety protocols were in place, including screening questions prior to the visit, additional usage of staff PPE, and extensive cleaning of exam room while observing appropriate contact time as indicated for disinfecting solutions.   ? ? ?HPI ?Jace is here for follow up of her chronic medical problems, including htn, anxiety ? ?She is taking all of her medications as prescribed.   ? ?Wed night - woke up in the middle of the night with sharp pain.  No spots.  Gargled, chloraseptic spray, lozenges.  Next day not as bad.  Glands swollen.  Did vit C.   Now dull pain and neck is tender - glands are tender.  Had low fever one night.  Took tylenol.  Drinking plenty of liquids.  Husband was getting over something the beginning of last week, went to pulm rehab last early week.  ? ?Still working with EAP for her anxiety.  Still on a leave from work.  ? ? ?Still seeing EAP counselor.   Several panic attack - woke up with one in the middle of the night.  Still has increased anxiety.  She is feeling a little overwhelmed with possible jobs she may need to transfer into or other options.  ? ? she feels like the Cymbalta is working well and is happy with the dose.  She does not feel like she needs a higher dose at this time. ? ? ?Medications and allergies reviewed with patient and updated if appropriate. ? ?Current Outpatient Medications on File Prior to Visit  ?Medication Sig Dispense Refill  ? albuterol (PROVENTIL HFA) 108 (90 Base) MCG/ACT inhaler Inhale 2 puffs into the lungs 4 (four) times daily as needed. Reported on 09/03/2015 1 Inhaler 4  ? albuterol (PROVENTIL) (2.5 MG/3ML) 0.083% nebulizer solution Take 3 mLs (2.5 mg total) by nebulization every 6 (six) hours as needed for wheezing or shortness of breath. 120 vial 5  ? aspirin EC 81 MG tablet Take 81 mg  by mouth daily.    ? atenolol (TENORMIN) 25 MG tablet TAKE 1/2 TABLET BY MOUTH DAILY AS NEEDED FOR MITRAL PROLAPSE 45 tablet 0  ? azaTHIOprine (IMURAN) 50 MG tablet Take 2 tablets by mouth daily. 60 tablet 2  ? Cholecalciferol (VITAMIN D) 50 MCG (2000 UT) CAPS Take by mouth.    ? diclofenac Sodium (VOLTAREN) 1 % GEL APPLY 2 TO 4 GRAMS TO AFFECTED JOINT FOUR TIMES DAILY AS NEEDED 400 g 2  ? DULoxetine (CYMBALTA) 30 MG capsule Take 1 capsule (30 mg total) by mouth daily. 30 capsule 5  ? eletriptan (RELPAX) 20 MG tablet TAKE 1 TABLET BY MOUTH AS NEEDED FOR MIGRAINE 10 tablet 11  ? EMGALITY 120 MG/ML SOAJ ADMINISTER 1 ML UNDER THE SKIN EVERY 30 DAYS 1 mL 5  ? ENBREL MINI 50 MG/ML SOCT INSERT 1 MINI CARTRIDGE INTO AUTOINJECTOR AND INJECT 50 MG UNDER THE SKIN EVERY 7 DAYS. 12 mL 0  ? Fluticasone-Umeclidin-Vilant (TRELEGY ELLIPTA) 100-62.5-25 MCG/ACT AEPB Inhale 1 puff into the lungs daily. 60 each 5  ? gabapentin (NEURONTIN) 100 MG capsule Take 3 capsules (300 mg total) by mouth at bedtime. 90 capsule 5  ? guaiFENesin (MUCINEX) 600 MG 12 hr tablet Take 2 tablets (1,200 mg total) by mouth 2 (two) times daily as needed for cough or to  loosen phlegm. (Patient taking differently: Take 1,200 mg by mouth 2 (two) times daily.)    ? metFORMIN (GLUCOPHAGE) 500 MG tablet TAKE 1 TABLET(500 MG) BY MOUTH TWICE DAILY WITH A MEAL 180 tablet 1  ? Multiple Vitamins-Minerals (MULTIVITAMIN GUMMIES ADULT PO) Take 2 each by mouth daily.    ? Respiratory Therapy Supplies (FLUTTER) DEVI Use as directed (Patient taking differently: as needed. Use as directed) 1 each 0  ? Rimegepant Sulfate (NURTEC) 75 MG TBDP Take 75 mg by mouth daily as needed. For migraines. Take as close to onset of migraine as possible. One daily maximum. 10 tablet 6  ? telmisartan (MICARDIS) 20 MG tablet TAKE 1 TABLET(20 MG) BY MOUTH DAILY 90 tablet 1  ? ?No current facility-administered medications on file prior to visit.  ? ? ? ?Review of Systems  ?Constitutional:   Positive for fatigue (more than usual) and fever (low grade once).  ?HENT:  Positive for sore throat. Negative for congestion, postnasal drip and sinus pressure.   ?     Swollen neck glands  ?Eyes:  Negative for itching.  ?Respiratory:  Positive for cough (chronic - no change).   ?Neurological:  Positive for headaches (chronic - no change).  ? ?   ?Objective:  ? ?Vitals:  ? 11/22/21 1521  ?BP: 138/72  ?Pulse: 85  ?Temp: 98.2 ?F (36.8 ?C)  ?SpO2: 97%  ? ?BP Readings from Last 3 Encounters:  ?11/22/21 138/72  ?11/15/21 116/64  ?10/25/21 130/70  ? ?Wt Readings from Last 3 Encounters:  ?11/22/21 288 lb (130.6 kg)  ?11/15/21 289 lb 3.9 oz (131.2 kg)  ?10/25/21 289 lb (131.1 kg)  ? ?Body mass index is 43.79 kg/m?. ? ?  ?Physical Exam ?Constitutional:   ?   General: She is not in acute distress. ?   Appearance: Normal appearance. She is not ill-appearing.  ?HENT:  ?   Head: Normocephalic and atraumatic.  ?   Right Ear: Tympanic membrane, ear canal and external ear normal.  ?   Left Ear: Tympanic membrane, ear canal and external ear normal.  ?   Mouth/Throat:  ?   Mouth: Mucous membranes are moist.  ?   Pharynx: No oropharyngeal exudate or posterior oropharyngeal erythema.  ?Eyes:  ?   Conjunctiva/sclera: Conjunctivae normal.  ?Cardiovascular:  ?   Rate and Rhythm: Normal rate and regular rhythm.  ?Pulmonary:  ?   Effort: Pulmonary effort is normal. No respiratory distress.  ?   Breath sounds: Normal breath sounds. No wheezing or rales.  ?Musculoskeletal:  ?   Cervical back: Neck supple. No tenderness.  ?Lymphadenopathy:  ?   Cervical: Cervical adenopathy (Tender) present.  ?Skin: ?   General: Skin is warm and dry.  ?Neurological:  ?   Mental Status: She is alert.  ?Psychiatric:     ?   Mood and Affect: Mood normal.     ?   Behavior: Behavior normal.     ?   Thought Content: Thought content normal.     ?   Judgment: Judgment normal.  ? ?   ? ?Lab Results  ?Component Value Date  ? WBC 9.8 11/02/2021  ? HGB 13.4 11/02/2021  ?  HCT 40.6 11/02/2021  ? PLT 273 11/02/2021  ? GLUCOSE 100 (H) 11/02/2021  ? CHOL 129 07/11/2021  ? TRIG 78.0 07/11/2021  ? HDL 39.60 07/11/2021  ? [place] 74 07/11/2021  ? ALT 11 11/02/2021  ? AST 14 11/02/2021  ? NA 143 11/02/2021  ? K  4.2 11/02/2021  ? CL 107 11/02/2021  ? CREATININE 0.76 11/02/2021  ? BUN 12 11/02/2021  ? CO2 28 11/02/2021  ? TSH 6.06 (H) 07/11/2021  ? INR 1.03 09/18/2018  ? HGBA1C 5.9 07/11/2021  ? ? ? ?Assessment & Plan:  ? ? ?See Problem List for Assessment and Plan of chronic medical problems.  ? ? ?

## 2021-11-21 NOTE — Telephone Encounter (Signed)
Called Jill Harper because she was not present for her first day of class. She stated that she has fell ill and that she is seeing her PCP tomorrow. Told her to call me after that appt and gave her our department number.  ?

## 2021-11-22 ENCOUNTER — Ambulatory Visit: Payer: Federal, State, Local not specified - PPO | Admitting: Internal Medicine

## 2021-11-22 ENCOUNTER — Encounter: Payer: Self-pay | Admitting: Internal Medicine

## 2021-11-22 DIAGNOSIS — I10 Essential (primary) hypertension: Secondary | ICD-10-CM

## 2021-11-22 DIAGNOSIS — J029 Acute pharyngitis, unspecified: Secondary | ICD-10-CM | POA: Diagnosis not present

## 2021-11-22 DIAGNOSIS — F419 Anxiety disorder, unspecified: Secondary | ICD-10-CM | POA: Diagnosis not present

## 2021-11-22 NOTE — Assessment & Plan Note (Signed)
Subacute ?With panic attacks ?Continue Cymbalta 30 mg daily-tolerating well and is helping ?She is still experiencing anxiety with panic attacks, but we both feel increasing medication may not necessarily help ?Continue working with EAP counselor ?Working on trying to find a different position in [place] [employer]-would need to apply for federal disability ?She does feel little overwhelmed by everything right now and the different options going forth ?Continue to remain out of work ?Follow-up in 1 month ?FMLA paperwork adjusted ?

## 2021-11-22 NOTE — Patient Instructions (Addendum)
? ?  Letter written for work.  ? ? ?Medications changes include :   none ? ? ? ? ? ?Return in about 4 weeks (around 12/20/2021) for follow up. ? ?

## 2021-11-22 NOTE — Assessment & Plan Note (Signed)
Chronic ?Blood pressure well controlled ?She is experiencing anxiety and attacks but blood pressure seems to still be well controlled ?Continue telmisartan 20 mg daily ?

## 2021-11-22 NOTE — Assessment & Plan Note (Addendum)
Acute ?Likely viral in nature ?Sore throat getting better  ?Glands in neck still swollen and tender ?Continue symptomatic treatment ?She will let me know if this is not improving ? ?

## 2021-11-23 ENCOUNTER — Ambulatory Visit: Payer: Federal, State, Local not specified - PPO | Admitting: Physician Assistant

## 2021-11-23 ENCOUNTER — Encounter (HOSPITAL_COMMUNITY)
Admission: RE | Admit: 2021-11-23 | Discharge: 2021-11-23 | Disposition: A | Discharge status: Home / Self Care | Payer: Federal, State, Local not specified - PPO | Source: Ambulatory Visit

## 2021-11-23 ENCOUNTER — Telehealth (HOSPITAL_COMMUNITY): Payer: Self-pay | Admitting: Internal Medicine

## 2021-11-23 ENCOUNTER — Telehealth (HOSPITAL_COMMUNITY): Payer: Self-pay

## 2021-11-23 NOTE — Telephone Encounter (Signed)
Called Jill Harper back to tell her I got her message. Will begin exercise next Tuesday. Pt voiced understanding.  ?

## 2021-11-23 NOTE — Progress Notes (Signed)
? ?Office Visit Note ? ?Patient: Jill Harper             ?Date of Birth: 07/15/1966           ?MRN: [id number]             ?PCP: Binnie Rail, MD ?Referring: Binnie Rail, MD ?Visit Date: 11/29/2021 ?Occupation: @GUAROCC @ ? ?Subjective:  ?Fatigue  ? ?History of Present Illness: Jill Harper is a 56 y.o. female with history seropositive rheumatoid arthritis, polymyositis, and ILD. She is prescribed Enbrel 50 mg sq injections once weekly (02/18/20-1st injection) and Imuran 100 mg by mouth daily.  She held her dose of Imuran and Enbrel on Friday due to the concern for an upper respiratory viral infection.  Patient reports that 2 weeks ago she started pulmonary rehab orientation) and a 6-minute walk test.  That night she woke up in the middle the night with a sore throat.  She use Chloraseptic spray for symptomatic relief as well as salt water gargles.  Several days later she noticed swollen and tender lymph nodes in her neck.  She was evaluated by her PCP 1 week after her symptoms started.  At that time she was not given a prescription for an antibiotic due to the concern that she had a upper respiratory viral infection.  She continued to have persistent fatigue but her sore throat and swollen lymph nodes gradually improved.  Over time her fatigue has also started to improve.  She is having increased joint pain which she attributes to the gap and Enbrel and Imuran dosing.  She is noticing increased joint swelling and stiffness in both hands.  She is also having increased discomfort in both shoulders. ? ?Activities of Daily Living:  ?Patient reports morning stiffness for 10 minutes.   ?Patient Reports nocturnal pain.  ?Difficulty dressing/grooming: Denies ?Difficulty climbing stairs: Reports ?Difficulty getting out of chair: Reports ?Difficulty using hands for taps, buttons, cutlery, and/or writing: Reports ? ?Review of Systems  ?Constitutional:  Positive for fatigue.  ?HENT:  Negative for mouth sores, mouth dryness  and nose dryness.   ?Eyes:  Positive for dryness. Negative for pain and itching.  ?Respiratory:  Positive for shortness of breath and difficulty breathing.   ?Cardiovascular:  Positive for palpitations. Negative for chest pain.  ?Gastrointestinal:  Negative for blood in stool, constipation and diarrhea.  ?Endocrine: Negative for increased urination.  ?Genitourinary:  Negative for difficulty urinating.  ?Musculoskeletal:  Positive for joint pain, joint pain, joint swelling, myalgias, morning stiffness, muscle tenderness and myalgias.  ?Skin:  Negative for color change, rash and redness.  ?Allergic/Immunologic: Positive for susceptible to infections.  ?Neurological:  Positive for dizziness, numbness, headaches, memory loss and weakness.  ?Hematological:  Positive for bruising/bleeding tendency.  ?Psychiatric/Behavioral:  Positive for confusion.   ? ?PMFS History:  ?Patient Active Problem List  ? Diagnosis Date Noted  ? Pharyngitis, acute 11/22/2021  ? Anxiety 09/06/2021  ? Tingling 06/28/2021  ? Occipital neuralgia of left side 01/06/2020  ? Bilateral carpal tunnel syndrome 07/21/2019  ? Costochondritis 03/16/2019  ? Rib pain on right side 03/16/2019  ? Fibromuscular dysplasia ([place]) 10/17/2018  ? Hypertension 10/17/2018  ? Bronchiectasis with (acute) exacerbation ([place]) 10/08/2018  ? OSA (obstructive sleep apnea) 08/20/2018  ? Rheumatoid arthritis ([place]) 04/17/2018  ? ILD (interstitial lung disease) ([place]) 04/17/2018  ? Dyspnea 04/01/2018  ? Bronchiectasis ([place]) 09/02/2017  ? LVH (left ventricular hypertrophy), mild 06/02/2017  ? Mitral regurgitation 05/22/2017  ? Jo-1 antibody positive  01/29/2017  ? Primary osteoarthritis of both feet 01/29/2017  ? Primary osteoarthritis of both hands 01/29/2017  ? Left shoulder tendonitis 01/17/2017  ? Vitamin D deficiency 04/26/2016  ? Cough 07/18/2015  ? MVP (mitral valve prolapse)   ? Erythema nodosum   ? INCONTINENCE, URGE 05/26/2010  ? Migraine headache 02/15/2010  ? Fibromyalgia  06/20/2009  ? Polymyositis ([place]) 02/11/2008  ? BRONCHIECTASIS 02/10/2008  ?  ?Past Medical History:  ?Diagnosis Date  ? Abnormal liver function test   ? Bronchiectasis   ?    ? Dyspnea   ? Fibromuscular dysplasia ([place])   ? Fibromyalgia   ? Heart murmur   ? ILD (interstitial lung disease) ([place])   ? Joint pain   ? Menorrhagia   ? MIGRAINE HEADACHE   ? MVP (mitral valve prolapse)   ? Osteoarthritis   ? Polymyositis ([place])   ? Dr [name]; chronic MTX  ? Pulmonary fibrosis ([place])   ? Rheumatoid arthritis ([place])   ? Sleep apnea   ? Vitamin B 12 deficiency   ? Vitamin D deficiency   ?  ?Family History  ?Problem Relation Age of Onset  ? Diabetes Mother   ? Fibromyalgia Mother   ? Ulcers Mother   ? Heart failure Mother   ? Thyroid disease Mother   ? Obesity Mother   ? Multiple myeloma Father   ? Hypertension Father   ? Stroke Father   ? Lupus Sister   ? Other Sister   ?     abdominal adhesions resulting in bowel obstruction  ? Migraines Sister   ? Heart disease Brother   ?     bypass surgery  ? Rheum arthritis Sister   ? Multiple myeloma Sister   ? Hypertension Sister   ? Heart attack Sister   ? Diabetes Sister   ? Hypertension Sister   ? Rheum arthritis Sister   ? Diabetes Sister   ? Diabetes Brother   ? Headache Other   ?     siblings with headaches but not diagnosed as migraines  ? ?Past Surgical History:  ?Procedure Laterality Date  ? ablation uterine  2010  ? CARDIAC CATHETERIZATION  2002  ? normal  ? DILATION AND CURETTAGE OF UTERUS  08-31-08  ? Dr [name]  ? IR ANGIO INTRA EXTRACRAN SEL COM CAROTID INNOMINATE BILAT MOD SED  09/18/2018  ? IR ANGIO VERTEBRAL SEL VERTEBRAL BILAT MOD SED  09/18/2018  ? IR US GUIDE VASC ACCESS RIGHT  09/18/2018  ? ?Social History  ? ?Social History Narrative  ? Exercise: trying to walk - limited by fatigue  ? Lives at home with husband   ? Right handed  ? Caffeine: none   ? ?Immunization History  ?Administered Date(s) Administered  ? Influenza Split 04/10/2011, 06/18/2012,  05/06/2013  ? Influenza Whole 05/19/2008, 05/15/2009, 06/01/2010  ? Influenza,inj,Quad PF,6+ Mos 04/20/2014, 05/26/2015, 05/31/2016, 04/05/2017, 06/16/2018, 04/22/2019, 04/22/2020  ? Influenza-Unspecified 04/11/2021  ? PFIZER(Purple Top)SARS-COV-2 Vaccination 10/27/2019, 11/19/2019  ? Pneumococcal Conjugate-13 01/29/2017  ? Pneumococcal Polysaccharide-23 08/07/2007, 06/16/2018  ? Td 07/12/2009  ? Tdap 01/06/2020  ?  ? ?Objective: ?Vital Signs: BP 124/74 (BP Location: Left Arm, Patient Position: Sitting, Cuff Size: Large)   Pulse 92   Ht 5' 8"  (1.727 m)   Wt 289 lb 6.4 oz (131.3 kg)   BMI 44.00 kg/m?   ? ?Physical Exam ?Vitals and nursing note reviewed.  ?Constitutional:   ?   Appearance: She is well-developed.  ?HENT:  ?  Head: Normocephalic and atraumatic.  ?Eyes:  ?   Conjunctiva/sclera: Conjunctivae normal.  ?Cardiovascular:  ?   Rate and Rhythm: Normal rate and regular rhythm.  ?   Heart sounds: Normal heart sounds.  ?Pulmonary:  ?   Effort: Pulmonary effort is normal.  ?   Breath sounds: Normal breath sounds.  ?Abdominal:  ?   General: Bowel sounds are normal.  ?   Palpations: Abdomen is soft.  ?Musculoskeletal:  ?   Cervical back: Normal range of motion.  ?Lymphadenopathy:  ?   Cervical: No cervical adenopathy.  ?Skin: ?   General: Skin is warm and dry.  ?   Capillary Refill: Capillary refill takes less than 2 seconds.  ?Neurological:  ?   Mental Status: She is alert and oriented to person, place, and time.  ?Psychiatric:     ?   Behavior: Behavior normal.  ?  ? ?Musculoskeletal Exam: Generalized hyperalgesia and positive tender points.  C-spine has slightly limited ROM.  Trapezius muscle tension and tenderness bilaterally.  Painful ROM of both shoulders.  No upper extremity weakness. Elbow joints, wrist joints, MCPs, PIPs, and DIPs good ROM.  Tenderness and mild inflammation of the right 1st and 2nd MCPs. PIP and DIP thickening consistent with OA.  Hip joints have slightly limited ROM.  Knee joints have  good ROM with no warmth or effusion.  Ankle joints have good ROM with no tenderness or joint swelling. Pedal edema noted bilaterally.  ? ?CDAI Exam: ?CDAI Score: 6.6  ?Patient Global: 3 mm; Provider Global: 3 mm

## 2021-11-24 ENCOUNTER — Encounter: Payer: Self-pay | Admitting: Internal Medicine

## 2021-11-24 ENCOUNTER — Telehealth: Payer: Self-pay

## 2021-11-24 NOTE — Telephone Encounter (Signed)
Patient called stating she currently has a virus and would like to know if she should hold enbrel while she is sick. I advised patient to hold enbrel and resume once symptoms have resolved. Patient verbalized understanding.  ?

## 2021-11-26 MED ORDER — DULOXETINE HCL 60 MG PO CPEP: 60.0000 mg | ORAL_CAPSULE | Freq: Every day | ORAL | 1 refills | Status: DC

## 2021-11-28 ENCOUNTER — Encounter (HOSPITAL_COMMUNITY)
Admission: RE | Admit: 2021-11-28 | Discharge: 2021-11-28 | Disposition: A | Discharge status: Home / Self Care | Payer: Federal, State, Local not specified - PPO | Source: Ambulatory Visit | Attending: Pulmonary Disease | Admitting: Pulmonary Disease

## 2021-11-28 DIAGNOSIS — F410 Panic disorder [episodic paroxysmal anxiety] without agoraphobia: Secondary | ICD-10-CM | POA: Diagnosis not present

## 2021-11-28 DIAGNOSIS — J849 Interstitial pulmonary disease, unspecified: Secondary | ICD-10-CM | POA: Diagnosis not present

## 2021-11-28 LAB — GLUCOSE, CAPILLARY
Glucose-Capillary: 124 mg/dL — ABNORMAL HIGH (ref 70–99)
Glucose-Capillary: 109 mg/dL — ABNORMAL HIGH (ref 70–99)

## 2021-11-28 NOTE — Progress Notes (Signed)
Daily Session Note ? ?Patient Details  ?Name: Jill Harper ?MRN: [id number] ?Date of Birth: 1965/10/01 ?Referring Provider:   ?Flowsheet Row Pulmonary Rehab Walk Test from 11/15/2021 in [facility]  ?Referring Provider Sood  ? ?  ? ? ?Encounter Date: 11/28/2021 ? ?Check In: ? Session Check In - 11/28/21 1156   ? ?  ? Check-In  ? Supervising physician immediately available to respond to emergencies [facility] immediately available   ? Physician(s) [place]   ? Location [facility]   ? Staff Present Maurice Small, RN, Quentin Ore, MS, ACSM-CEP, Exercise Physiologist;Zadia Uhde Ysidro Evert, RN;Joan Leonia Reeves, RN, BSN   ? Virtual Visit No   ? Medication changes reported     No   ? Fall or balance concerns reported    No   ? Tobacco Cessation No Change   ? Warm-up and Cool-down Performed as group-led instruction   ? Resistance Training Performed Yes   ? VAD Patient? No   ? PAD/SET Patient? No   ?  ? Pain Assessment  ? Currently in Pain? Yes   ? Pain Score 7    ? Pain Location Shoulder   ? Pain Orientation Right;Left   ? Pain Descriptors / Indicators Aching   ? Pain Onset More than a month ago   ? Pain Frequency Intermittent   ? Multiple Pain Sites No   ? ?  ?  ? ?  ? ? ?Capillary Blood Glucose: ?Results for orders placed or performed during the hospital encounter of 11/28/21 (from the past 24 hour(s))  ?Glucose, capillary     Status: Abnormal  ? Collection Time: 11/28/21 11:28 AM  ?Result Value Ref Range  ? Glucose-Capillary 109 (H) 70 - 99 mg/dL  ? ? ? ? ?Social History  ? ?Tobacco Use  ?Smoking Status Never  ? Passive exposure: Never  ?Smokeless Tobacco Never  ?Tobacco Comments  ? Married, lives with spouse. 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 ? ?Goals Met:  ?Exercise tolerated well ?No report of concerns or symptoms today ?Strength training completed today ? ?Goals Unmet:  ?Not Applicable ? ?Comments: Service time is from 1025 to  1144 ? ? ? ?Dr. Rodman Pickle is Medical Director for Pulmonary Rehab at [facility].  ?

## 2021-11-29 ENCOUNTER — Ambulatory Visit: Payer: Federal, State, Local not specified - PPO | Admitting: Physician Assistant

## 2021-11-29 ENCOUNTER — Encounter: Payer: Self-pay | Admitting: Physician Assistant

## 2021-11-29 VITALS — BP 124/74 | HR 92 | Ht 68.0 in | Wt 289.4 lb

## 2021-11-29 DIAGNOSIS — Z79899 Other long term (current) drug therapy: Secondary | ICD-10-CM | POA: Diagnosis not present

## 2021-11-29 DIAGNOSIS — M19041 Primary osteoarthritis, right hand: Secondary | ICD-10-CM

## 2021-11-29 DIAGNOSIS — J849 Interstitial pulmonary disease, unspecified: Secondary | ICD-10-CM

## 2021-11-29 DIAGNOSIS — Z111 Encounter for screening for respiratory tuberculosis: Secondary | ICD-10-CM

## 2021-11-29 DIAGNOSIS — M0579 Rheumatoid arthritis with rheumatoid factor of multiple sites without organ or systems involvement: Secondary | ICD-10-CM | POA: Diagnosis not present

## 2021-11-29 DIAGNOSIS — G5603 Carpal tunnel syndrome, bilateral upper limbs: Secondary | ICD-10-CM

## 2021-11-29 DIAGNOSIS — M19072 Primary osteoarthritis, left ankle and foot: Secondary | ICD-10-CM

## 2021-11-29 DIAGNOSIS — R7689 Other specified abnormal immunological findings in serum: Secondary | ICD-10-CM

## 2021-11-29 DIAGNOSIS — Z8679 Personal history of other diseases of the circulatory system: Secondary | ICD-10-CM

## 2021-11-29 DIAGNOSIS — I7300 Raynaud's syndrome without gangrene: Secondary | ICD-10-CM

## 2021-11-29 DIAGNOSIS — J479 Bronchiectasis, uncomplicated: Secondary | ICD-10-CM

## 2021-11-29 DIAGNOSIS — R768 Other specified abnormal immunological findings in serum: Secondary | ICD-10-CM

## 2021-11-29 DIAGNOSIS — M19071 Primary osteoarthritis, right ankle and foot: Secondary | ICD-10-CM

## 2021-11-29 DIAGNOSIS — M3320 Polymyositis, organ involvement unspecified: Secondary | ICD-10-CM | POA: Diagnosis not present

## 2021-11-29 DIAGNOSIS — I773 Arterial fibromuscular dysplasia: Secondary | ICD-10-CM

## 2021-11-29 DIAGNOSIS — Z8669 Personal history of other diseases of the nervous system and sense organs: Secondary | ICD-10-CM

## 2021-11-29 DIAGNOSIS — L52 Erythema nodosum: Secondary | ICD-10-CM

## 2021-11-29 DIAGNOSIS — R5383 Other fatigue: Secondary | ICD-10-CM

## 2021-11-29 DIAGNOSIS — E559 Vitamin D deficiency, unspecified: Secondary | ICD-10-CM

## 2021-11-29 DIAGNOSIS — M62838 Other muscle spasm: Secondary | ICD-10-CM

## 2021-11-29 DIAGNOSIS — M797 Fibromyalgia: Secondary | ICD-10-CM

## 2021-11-29 DIAGNOSIS — M19042 Primary osteoarthritis, left hand: Secondary | ICD-10-CM

## 2021-11-29 MED ORDER — PREDNISONE 5 MG PO TABS: ORAL_TABLET | ORAL | 0 refills | Status: DC

## 2021-11-29 NOTE — Patient Instructions (Signed)
Standing Labs ?We placed an order today for your standing lab work.  ? ?Please have your standing labs drawn in June and every 3 months  ? ?If possible, please have your labs drawn 2 weeks prior to your appointment so that the provider can discuss your results at your appointment. ? ?Please note that you may see your imaging and lab results in MyChart before we have reviewed them. ?We may be awaiting multiple results to interpret others before contacting you. ?Please allow our office up to 72 hours to thoroughly review all of the results before contacting the office for clarification of your results. ? ?We have open lab daily: ?Monday through Thursday from 1:30-4:30 PM and Friday from 1:30-4:00 PM ?at the office of Dr. [name] Deveshwar, [facility].   ?Please be advised, all patients with office appointments requiring lab work will take precedent over walk-in lab work.  ?If possible, please come for your lab work on Monday and Friday afternoons, as you may experience shorter wait times. ?The office is located at [address], [address], [place], [place] [address] ?No appointment is necessary.   ?Labs are drawn by [facility]. Please bring your co-pay at the time of your lab draw.  You may receive a bill from [facility] for your lab work. ? ?Please note if you are on Hydroxychloroquine and and an order has been placed for a Hydroxychloroquine level, you will need to have it drawn 4 hours or more after your last dose. ? ?If you wish to have your labs drawn at another location, please call the office 24 hours in advance to send orders. ? ?If you have any questions regarding directions or hours of operation,  ?please call [phone].   ?As a reminder, please drink plenty of water prior to coming for your lab work. Thanks! ? ?

## 2021-11-29 NOTE — Progress Notes (Signed)
Pulmonary Individual Treatment Plan ? ?Patient Details  ?Name: Jill Harper ?MRN: [id number] ?Date of Birth: Dec 20, 1965 ?Referring Provider:   ?Flowsheet Row Pulmonary Rehab Walk Test from 11/15/2021 in [facility]  ?Referring Provider Sood  ? ?  ? ? ?Initial Encounter Date:  ?Flowsheet Row Pulmonary Rehab Walk Test from 11/15/2021 in [facility]  ?Date 11/15/21  ? ?  ? ? ?Visit Diagnosis: ILD (interstitial lung disease) ([place]) ? ?Patient's Home Medications on Admission:  ? ?Current Outpatient Medications:  ?  albuterol (PROVENTIL HFA) 108 (90 Base) MCG/ACT inhaler, Inhale 2 puffs into the lungs 4 (four) times daily as needed. Reported on 09/03/2015, Disp: 1 Inhaler, Rfl: 4 ?  albuterol (PROVENTIL) (2.5 MG/3ML) 0.083% nebulizer solution, Take 3 mLs (2.5 mg total) by nebulization every 6 (six) hours as needed for wheezing or shortness of breath., Disp: 120 vial, Rfl: 5 ?  aspirin EC 81 MG tablet, Take 81 mg by mouth daily., Disp: , Rfl:  ?  atenolol (TENORMIN) 25 MG tablet, TAKE 1/2 TABLET BY MOUTH DAILY AS NEEDED FOR MITRAL PROLAPSE, Disp: 45 tablet, Rfl: 0 ?  azaTHIOprine (IMURAN) 50 MG tablet, Take 2 tablets by mouth daily., Disp: 60 tablet, Rfl: 2 ?  Cholecalciferol (VITAMIN D) 50 MCG (2000 UT) CAPS, Take by mouth., Disp: , Rfl:  ?  diclofenac Sodium (VOLTAREN) 1 % GEL, APPLY 2 TO 4 GRAMS TO AFFECTED JOINT FOUR TIMES DAILY AS NEEDED, Disp: 400 g, Rfl: 2 ?  DULoxetine (CYMBALTA) 60 MG capsule, Take 1 capsule (60 mg total) by mouth daily., Disp: 90 capsule, Rfl: 1 ?  eletriptan (RELPAX) 20 MG tablet, TAKE 1 TABLET BY MOUTH AS NEEDED FOR MIGRAINE, Disp: 10 tablet, Rfl: 11 ?  EMGALITY 120 MG/ML SOAJ, ADMINISTER 1 ML UNDER THE SKIN EVERY 30 DAYS, Disp: 1 mL, Rfl: 5 ?  ENBREL MINI 50 MG/ML SOCT, INSERT 1 MINI CARTRIDGE INTO AUTOINJECTOR AND INJECT 50 MG UNDER THE SKIN EVERY 7 DAYS., Disp: 12 mL, Rfl: 0 ?  Fluticasone-Umeclidin-Vilant (TRELEGY ELLIPTA) 100-62.5-25  MCG/ACT AEPB, Inhale 1 puff into the lungs daily., Disp: 60 each, Rfl: 5 ?  gabapentin (NEURONTIN) 100 MG capsule, Take 3 capsules (300 mg total) by mouth at bedtime., Disp: 90 capsule, Rfl: 5 ?  guaiFENesin (MUCINEX) 600 MG 12 hr tablet, Take 2 tablets (1,200 mg total) by mouth 2 (two) times daily as needed for cough or to loosen phlegm. (Patient taking differently: Take 1,200 mg by mouth 2 (two) times daily.), Disp: , Rfl:  ?  metFORMIN (GLUCOPHAGE) 500 MG tablet, TAKE 1 TABLET(500 MG) BY MOUTH TWICE DAILY WITH A MEAL, Disp: 180 tablet, Rfl: 1 ?  Multiple Vitamins-Minerals (MULTIVITAMIN GUMMIES ADULT PO), Take 2 each by mouth daily., Disp: , Rfl:  ?  Respiratory Therapy Supplies (FLUTTER) DEVI, Use as directed (Patient taking differently: as needed. Use as directed), Disp: 1 each, Rfl: 0 ?  Rimegepant Sulfate (NURTEC) 75 MG TBDP, Take 75 mg by mouth daily as needed. For migraines. Take as close to onset of migraine as possible. One daily maximum., Disp: 10 tablet, Rfl: 6 ?  telmisartan (MICARDIS) 20 MG tablet, TAKE 1 TABLET(20 MG) BY MOUTH DAILY, Disp: 90 tablet, Rfl: 1 ? ?Past Medical History: ?Past Medical History:  ?Diagnosis Date  ? Abnormal liver function test   ? Bronchiectasis   ?    ? Dyspnea   ? Fibromuscular dysplasia ([place])   ? Fibromyalgia   ? Heart murmur   ?  ILD (interstitial lung disease) ([place])   ? Joint pain   ? Menorrhagia   ? MIGRAINE HEADACHE   ? MVP (mitral valve prolapse)   ? Osteoarthritis   ? Polymyositis ([place])   ? Dr [name]; chronic MTX  ? Pulmonary fibrosis ([place])   ? Rheumatoid arthritis ([place])   ? Sleep apnea   ? Vitamin B 12 deficiency   ? Vitamin D deficiency   ? ? ?Tobacco Use: ?Social History  ? ?Tobacco Use  ?Smoking Status Never  ? Passive exposure: Never  ?Smokeless Tobacco Never  ?Tobacco Comments  ? Married, lives with spouse. 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 ? ?Labs: ?Review Flowsheet   ? ?  ?  Latest Ref Rng & Units 08/28/2012  04/24/2016 10/09/2018 10/12/2019  ?Labs for [facility]P Cardiac and Pulmonary Rehab  ?Cholestrol 0 - 200 mg/dL 141   110   141   154    ?LDL (calc) 0 - 99 mg/dL 74   59   74   83    ?HDL-C >39.00 mg/dL 57.10   39.20   44   61    ?Trlycerides 0.0 - 149.0 mg/dL 51.0   59.0   116   43    ?Hemoglobin A1c 4.6 - 6.5 %  4.8   5.5   5.5    ? ?  07/11/2021  ?Labs for [facility]P Cardiac and Pulmonary Rehab  ?Cholestrol 129    ?LDL (calc) 74    ?HDL-C 39.60    ?Trlycerides 78.0    ?Hemoglobin A1c 5.9    ?  ? ? Multiple values from one day are sorted in reverse-chronological order  ?  ?  ? ? ?Capillary Blood Glucose: ?Lab Results  ?Component Value Date  ? GLUCAP 109 (H) 11/28/2021  ? GLUCAP 124 (H) 11/28/2021  ? ? ? ?Pulmonary Assessment Scores: ? Pulmonary Assessment Scores   ? ? [place] Name 11/15/21 1122  ?  ?  ?  ? ADL UCSD  ? ADL Phase Entry    ? SOB Score total 70    ?  ? CAT Score  ? CAT Score 28    ?  ? mMRC Score  ? mMRC Score 4    ? ?  ?  ? ?  ? ?UCSD: ?Self-administered rating of dyspnea associated with activities of daily living (ADLs) ?6-point scale (0 = "not at all" to 5 = "maximal or unable to do because of breathlessness")  ?Scoring Scores range from 0 to 120.  Minimally important difference is 5 units ? ?CAT: ?CAT can identify the health impairment of COPD patients and is better correlated with disease progression.  ?CAT has a scoring range of zero to 40. The CAT score is classified into four groups of low (less than 10), medium (10 - 20), high (21-30) and very high (31-40) based on the impact level of disease on health status. A CAT score over 10 suggests significant symptoms.  A worsening CAT score could be explained by an exacerbation, poor medication adherence, poor inhaler technique, or progression of COPD or comorbid conditions.  ?CAT MCID is 2 points ? ?mMRC: ?mMRC (Modified Medical Research Council) Dyspnea Scale is used to assess the degree of baseline functional disability in patients of respiratory disease due to  dyspnea. ?No minimal important difference is established. A decrease in score of 1 point or greater is considered a positive change.  ? ?Pulmonary Function Assessment: ? Pulmonary Function  Assessment - 11/15/21 1131   ? ?  ? Breath  ? Bilateral Breath Sounds Clear   ? Shortness of Breath Yes;Fear of Shortness of Breath;Limiting activity;Panic with Shortness of Breath   ? ?  ?  ? ?  ? ? ?Exercise Target Goals: ?Exercise Program Goal: ?Individual exercise prescription set using results from initial 6 min walk test and THRR while considering  patient?s activity barriers and safety.  ? ?Exercise Prescription Goal: ?Initial exercise prescription builds to 30-45 minutes a day of aerobic activity, 2-3 days per week.  Home exercise guidelines will be given to patient during program as part of exercise prescription that the participant will acknowledge. ? ?Activity Barriers & Risk Stratification: ? Activity Barriers & Cardiac Risk Stratification - 11/15/21 1112   ? ?  ? Activity Barriers & Cardiac Risk Stratification  ? Activity Barriers Arthritis;Shortness of Breath;Fibromyalgia;Joint Problems   RA  ? Cardiac Risk Stratification Low   ? ?  ?  ? ?  ? ? ?6 Minute Walk: ? 6 Minute Walk   ? ? [place] Name 11/15/21 1257  ?  ?  ?  ? 6 Minute Walk  ? Phase Initial    ? Distance 1230 feet    ? Walk Time 6 minutes    ? # of Rest Breaks 0    ? MPH 2.33    ? METS 3.19    ? RPE 17    ? Perceived Dyspnea  3    ? VO2 Peak 11.15    ? Symptoms No    ? Resting HR 80 bpm    ? Resting BP 116/64    ? Resting Oxygen Saturation  97 %    ? Exercise Oxygen Saturation  during 6 min walk 95 %    ? Max Ex. HR 144 bpm    ? Max Ex. BP 148/70    ? 2 Minute Post BP 130/70    ?  ? Interval HR  ? 1 Minute HR 112    ? 2 Minute HR 110    ? 3 Minute HR 115    ? 4 Minute HR 133    ? 5 Minute HR 141    ? 6 Minute HR 144    ? 2 Minute Post HR 103    ? Interval Heart Rate? Yes    ?  ? Interval Oxygen  ? Interval Oxygen? Yes    ? Baseline Oxygen Saturation % 97 %    ?  1 Minute Oxygen Saturation % 96 %    ? 1 Minute Liters of Oxygen 0 L    ? 2 Minute Oxygen Saturation % 95 %    ? 2 Minute Liters of Oxygen 0 L    ? 3 Minute Oxygen Saturation % 97 %    ? 3 Minute Liters of Oxygen 0 L

## 2021-11-30 ENCOUNTER — Encounter (HOSPITAL_COMMUNITY)
Admission: RE | Admit: 2021-11-30 | Discharge: 2021-11-30 | Disposition: A | Discharge status: Home / Self Care | Payer: Federal, State, Local not specified - PPO | Source: Ambulatory Visit | Attending: Pulmonary Disease | Admitting: Pulmonary Disease

## 2021-11-30 DIAGNOSIS — J849 Interstitial pulmonary disease, unspecified: Secondary | ICD-10-CM | POA: Diagnosis not present

## 2021-11-30 LAB — GLUCOSE, CAPILLARY
Glucose-Capillary: 101 mg/dL — ABNORMAL HIGH (ref 70–99)
Glucose-Capillary: 95 mg/dL (ref 70–99)

## 2021-11-30 NOTE — Progress Notes (Signed)
Daily Session Note ? ?Patient Details  ?Name: Jill Harper ?MRN: [id number] ?Date of Birth: 04/12/1966 ?Referring Provider:   ?Flowsheet Row Pulmonary Rehab Walk Test from 11/15/2021 in [facility]  ?Referring Provider Sood  ? ?  ? ? ?Encounter Date: 11/28/2021 ? ?Check In: ? ? ?Capillary Blood Glucose: ?No results found for this or any previous visit (from the past 24 hour(s)). ? ? ? ?Social History  ? ?Tobacco Use  ?Smoking Status Never  ? Passive exposure: Never  ?Smokeless Tobacco Never  ?Tobacco Comments  ? Married, lives with spouse. 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 ? ?Goals Met:  ?Proper associated with RPD/PD & O2 Sat ?Exercise tolerated well ?No report of concerns or symptoms today ?Strength training completed today ? ?Goals Unmet:  ?Not Applicable ? ?Comments: Service time is from 1015 to 1145 ? ? ? ?Dr. Jane Ellison is Medical Director for Pulmonary Rehab at [facility].  ?

## 2021-12-05 ENCOUNTER — Encounter (HOSPITAL_COMMUNITY)
Admission: RE | Admit: 2021-12-05 | Discharge: 2021-12-05 | Disposition: A | Discharge status: Home / Self Care | Payer: Federal, State, Local not specified - PPO | Source: Ambulatory Visit | Attending: Pulmonary Disease | Admitting: Pulmonary Disease

## 2021-12-05 VITALS — Wt 292.6 lb

## 2021-12-05 DIAGNOSIS — J849 Interstitial pulmonary disease, unspecified: Secondary | ICD-10-CM | POA: Insufficient documentation

## 2021-12-05 NOTE — Telephone Encounter (Signed)
FMLA Paperwork has been dropped off and placed in provider's mailbox.  ? ?Please call pt once forms have been completed.  ?

## 2021-12-05 NOTE — Progress Notes (Signed)
Daily Session Note ? ?Patient Details  ?Name: Jill Harper ?MRN: [id number] ?Date of Birth: 21-Aug-1965 ?Referring Provider:   ?Flowsheet Row Pulmonary Rehab Walk Test from 11/15/2021 in [facility]  ?Referring Provider Sood  ? ?  ? ? ?Encounter Date: 12/05/2021 ? ?Check In: ? Session Check In - 12/05/21 1148   ? ?  ? Check-In  ? Supervising physician immediately available to respond to emergencies [facility] immediately available   ? Physician(s) Dr. Bonner Puna   ? Location [facility]   ? Staff Present Elmon Else, MS, ACSM-CEP, Exercise Physiologist;Olinty Celesta Aver, MS, ACSM CEP, Exercise Physiologist;Carlette Wilber Oliphant, RN, Maxcine Ham, RN, BSN   ? Virtual Visit No   ? Medication changes reported     No   ? Fall or balance concerns reported    No   ? Tobacco Cessation No Change   ? Warm-up and Cool-down Performed as group-led instruction   ? Resistance Training Performed Yes   ? VAD Patient? No   ? PAD/SET Patient? No   ?  ? Pain Assessment  ? Currently in Pain? No/denies   ? Pain Score 0-No pain   ? Multiple Pain Sites No   ? ?  ?  ? ?  ? ? ?Capillary Blood Glucose: ?No results found for this or any previous visit (from the past 24 hour(s)). ? ? Exercise Prescription Changes - 12/05/21 1200   ? ?  ? Response to Exercise  ? Blood Pressure (Admit) 120/68   ? Blood Pressure (Exercise) 140/78   ? Blood Pressure (Exit) 112/70   ? Heart Rate (Admit) 105 bpm   ? Heart Rate (Exercise) 120 bpm   ? Heart Rate (Exit) 95 bpm   ? Oxygen Saturation (Admit) 96 %   ? Oxygen Saturation (Exercise) 97 %   ? Oxygen Saturation (Exit) 98 %   ? Rating of Perceived Exertion (Exercise) 12   ? Perceived Dyspnea (Exercise) 2   ? Duration Progress to 30 minutes of  aerobic without signs/symptoms of physical distress   ? Intensity THRR unchanged   40-80% HRR  ?  ? Resistance Training  ? Training Prescription Yes   ? Weight Red bands   ? Reps 10-15   ? Time 10 Minutes   ?  ? NuStep  ? Level 2    ? SPM 80   ? Minutes 15   ? METs 1.9   ?  ? Arm Ergometer  ? Level 1.5   ? Minutes 15   ? METs 1.6   ? ?  ?  ? ?  ? ? ?Social History  ? ?Tobacco Use  ?Smoking Status Never  ? Passive exposure: Never  ?Smokeless Tobacco Never  ?Tobacco Comments  ? Married, lives with spouse. 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 ? ?Goals Met:  ?Proper associated with RPD/PD & O2 Sat ?Exercise tolerated well ?No report of concerns or symptoms today ?Strength training completed today ? ?Goals Unmet:  ?Not Applicable ? ?Comments: Service time is from 1024 to 1145 ? ? ? ?Dr. Rodman Pickle is Medical Director for Pulmonary Rehab at [facility].  ?

## 2021-12-07 ENCOUNTER — Encounter (HOSPITAL_COMMUNITY)
Admission: RE | Admit: 2021-12-07 | Discharge: 2021-12-07 | Disposition: A | Discharge status: Home / Self Care | Payer: Federal, State, Local not specified - PPO | Source: Ambulatory Visit | Attending: Pulmonary Disease | Admitting: Pulmonary Disease

## 2021-12-07 DIAGNOSIS — J849 Interstitial pulmonary disease, unspecified: Secondary | ICD-10-CM

## 2021-12-07 NOTE — Progress Notes (Signed)
Daily Session Note ? ?Patient Details  ?Name: Jill Harper ?MRN: [id number] ?Date of Birth: Nov 23, 1965 ?Referring Provider:   ?Flowsheet Row Pulmonary Rehab Walk Test from 11/15/2021 in [place]  ?Referring Provider Sood  ? ?  ? ? ?Encounter Date: 12/07/2021 ? ?Check In: ? Session Check In - 12/07/21 1124   ? ?  ? Check-In  ? Supervising physician immediately available to respond to emergencies [facility] immediately available   ? Physician(s) Dr. Karleen Hampshire   ? Location [facility]   ? Staff Present Rosebud Poles, RN, BSN;Carlette Wilber Oliphant, RN, Quentin Ore, MS, ACSM-CEP, Exercise Physiologist;David Makemson, MS, ACSM-CEP, CCRP, Exercise Physiologist   ? Virtual Visit No   ? Medication changes reported     No   ? Fall or balance concerns reported    No   ? Tobacco Cessation No Change   ? Warm-up and Cool-down Performed as group-led instruction   ? Resistance Training Performed Yes   ? VAD Patient? No   ? PAD/SET Patient? No   ?  ? Pain Assessment  ? Currently in Pain? No/denies   ? ?  ?  ? ?  ? ? ?Capillary Blood Glucose: ?No results found for this or any previous visit (from the past 24 hour(s)). ? ? ? ?Social History  ? ?Tobacco Use  ?Smoking Status Never  ? Passive exposure: Never  ?Smokeless Tobacco Never  ?Tobacco Comments  ? Married, lives with spouse. 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 ? ?Goals Met:  ?Proper associated with RPD/PD & O2 Sat ?Exercise tolerated well ?No report of concerns or symptoms today ?Strength training completed today ? ?Goals Unmet:  ?Not Applicable ? ?Comments: Service time is from 1016 ? to 1145 ? ? ? ?Dr. Rodman Pickle is Medical Director for Pulmonary Rehab at [facility].  ?

## 2021-12-12 ENCOUNTER — Encounter (HOSPITAL_COMMUNITY)
Admission: RE | Admit: 2021-12-12 | Discharge: 2021-12-12 | Disposition: A | Discharge status: Home / Self Care | Payer: Federal, State, Local not specified - PPO | Source: Ambulatory Visit | Attending: Pulmonary Disease | Admitting: Pulmonary Disease

## 2021-12-12 DIAGNOSIS — J849 Interstitial pulmonary disease, unspecified: Secondary | ICD-10-CM | POA: Diagnosis not present

## 2021-12-12 NOTE — Progress Notes (Signed)
Office Visit Note  Patient: Jill Harper             Date of Birth: May 15, 1966           MRN: [id number]             PCP: Binnie Rail, MD Referring: Binnie Rail, MD Visit Date: 12/26/2021 Occupation: @GUAROCC @  Subjective:  Pain in multiple joints  History of Present Illness: Jill Harper is a 56 y.o. female with history of seropositive rheumatoid arthritis, osteoarthritis, polymyositis, ILD and fibromyalgia.  She was seen by Lovena Le in April for possible flare and was given a prednisone taper.  She states the prednisone taper helped her joint discomfort and also fibromyalgia symptoms.  She has been going to pulmonary rehab which has been helpful.  She denies any increased increased shortness of breath.  She has been seen by [name] in March and her high-resolution CT scan did not show any progression.  She states she has been having intermittent increased pain and discomfort in her joints.  Last night she woke up with increased pain in her bilateral hands and her bilateral feet.  She has history of fibromyalgia and fibro fog.  She works at the [employer].  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  Recently due to increased workload and being short staffed, she has extra duties which include handling phone calls.  She states due to her fibromyalgia flares and fibro fog it is hard for her to keep up with her work schedule.  She has talked to her supervisor who advised her that she should apply for disability as there is no job available to accommodate her limitations.  She is applying for federal and Social Security disability.  Activities of Daily Living:  Patient reports morning stiffness for 10 minutes.   Patient Reports nocturnal pain.  Difficulty dressing/grooming: Denies Difficulty climbing stairs: Reports Difficulty getting out of chair: Denies Difficulty using hands for taps, buttons, cutlery, and/or writing:  Reports  Review of Systems  Constitutional:  Positive for fatigue.  HENT:  Positive for mouth dryness.   Eyes:  Positive for dryness.  Respiratory:  Positive for shortness of breath.   Cardiovascular:  Negative for swelling in legs/feet.  Gastrointestinal:  Negative for constipation.  Endocrine: Negative for excessive thirst.  Genitourinary:  Negative for difficulty urinating.  Musculoskeletal:  Positive for joint pain, joint pain, joint swelling, muscle weakness, morning stiffness and muscle tenderness.  Skin:  Negative for rash.  Allergic/Immunologic: Positive for susceptible to infections.  Neurological:  Positive for weakness.  Hematological:  Positive for bruising/bleeding tendency.  Psychiatric/Behavioral:  Positive for sleep disturbance.    PMFS History:  Patient Active Problem List   Diagnosis Date Noted   Pharyngitis, acute 11/22/2021   Anxiety 09/06/2021   Tingling 06/28/2021   Occipital neuralgia of left side 01/06/2020   Bilateral carpal tunnel syndrome 07/21/2019   Costochondritis 03/16/2019   Rib pain on right side 03/16/2019   Fibromuscular dysplasia ([place]) 10/17/2018   Hypertension 10/17/2018   Bronchiectasis with (acute) exacerbation ([place]) 10/08/2018   OSA (obstructive sleep apnea) 08/20/2018   Rheumatoid arthritis ([place]) 04/17/2018   ILD (interstitial lung disease) ([place]) 04/17/2018   Dyspnea 04/01/2018   Bronchiectasis ([place]) 09/02/2017   LVH (left ventricular hypertrophy), mild 06/02/2017   Mitral regurgitation 05/22/2017   Jo-1 antibody positive 01/29/2017   Primary osteoarthritis of both feet 01/29/2017   Primary  osteoarthritis of both hands 01/29/2017   Left shoulder tendonitis 01/17/2017   Vitamin D deficiency 04/26/2016   Cough 07/18/2015   MVP (mitral valve prolapse)    Erythema nodosum    INCONTINENCE, URGE 05/26/2010   Migraine headache 02/15/2010   Fibromyalgia 06/20/2009   Polymyositis ([place]) 02/11/2008   BRONCHIECTASIS 02/10/2008    Past  Medical History:  Diagnosis Date   Abnormal liver function test    Bronchiectasis        Dyspnea    Fibromuscular dysplasia (HCC)    Fibromyalgia    Heart murmur    ILD (interstitial lung disease) ([place])    Joint pain    Menorrhagia    MIGRAINE HEADACHE    MVP (mitral valve prolapse)    Osteoarthritis    Polymyositis (HCC)    Dr [name]; chronic MTX   Pulmonary fibrosis ([place])    Rheumatoid arthritis ([place])    Sleep apnea    Vitamin B 12 deficiency    Vitamin D deficiency     Family History  Problem Relation Age of Onset   Diabetes Mother    Fibromyalgia Mother    Ulcers Mother    Heart failure Mother    Thyroid disease Mother    Obesity Mother    Multiple myeloma Father    Hypertension Father    Stroke Father    Lupus Sister    Other Sister        abdominal adhesions resulting in bowel obstruction   Migraines Sister    Heart disease Brother        bypass surgery   Rheum arthritis Sister    Multiple myeloma Sister    Hypertension Sister    Heart attack Sister    Diabetes Sister    Hypertension Sister    Rheum arthritis Sister    Diabetes Sister    Diabetes Brother    Headache Other        siblings with headaches but not diagnosed as migraines   Past Surgical History:  Procedure Laterality Date   ablation uterine  2010   CARDIAC CATHETERIZATION  2002   normal   DILATION AND CURETTAGE OF UTERUS  08-31-08   Dr [name]   IR [place] SEL COM CAROTID INNOMINATE BILAT MOD SED  09/18/2018   IR ANGIO VERTEBRAL SEL VERTEBRAL BILAT MOD SED  09/18/2018   IR US GUIDE VASC ACCESS RIGHT  09/18/2018   Social History   Social History Narrative   Exercise: trying to walk - limited by fatigue   Lives at home with husband    Right handed   Caffeine: none    Immunization History  Administered Date(s) Administered   Influenza Split 04/10/2011, 06/18/2012, 05/06/2013   Influenza Whole 05/19/2008, 05/15/2009, 06/01/2010   Influenza,inj,Quad  PF,6+ Mos 04/20/2014, 05/26/2015, 05/31/2016, 04/05/2017, 06/16/2018, 04/22/2019, 04/22/2020   Influenza-Unspecified 04/11/2021   PFIZER(Purple Top)SARS-COV-2 Vaccination 10/27/2019, 11/19/2019   Pneumococcal Conjugate-13 01/29/2017   Pneumococcal Polysaccharide-23 08/07/2007, 06/16/2018   Td 07/12/2009   Tdap 01/06/2020     Objective: Vital Signs: BP 138/75 (BP Location: Left Arm, Patient Position: Sitting, Cuff Size: Large)   Pulse 94   Resp 12   Ht 5' 8"  (1.727 m)   Wt 295 lb 9.6 oz (134.1 kg)   BMI 44.95 kg/m    Physical Exam Vitals and nursing note reviewed.  Constitutional:      Appearance: She is well-developed.  HENT:     Head: Normocephalic and atraumatic.  Eyes:  Conjunctiva/sclera: Conjunctivae normal.  Cardiovascular:     Rate and Rhythm: Normal rate and regular rhythm.     Heart sounds: Normal heart sounds.  Pulmonary:     Effort: Pulmonary effort is normal.     Breath sounds: Normal breath sounds.  Abdominal:     General: Bowel sounds are normal.     Palpations: Abdomen is soft.  Musculoskeletal:     Cervical back: Normal range of motion.  Lymphadenopathy:     Cervical: No cervical adenopathy.  Skin:    General: Skin is warm and dry.     Capillary Refill: Capillary refill takes less than 2 seconds.  Neurological:     Mental Status: She is alert and oriented to person, place, and time.  Psychiatric:        Behavior: Behavior normal.     Musculoskeletal Exam: C-spine was in good range of motion.  Shoulder joints, elbow joints, wrist joints, MCPs PIPs and DIPs with good range of motion with no synovitis.  Hip joints, knee joints in good range of motion.  She had no tenderness over ankles or MTPs.  No synovitis was noted.  CDAI Exam: CDAI Score: -- Patient Global: --; Provider Global: -- Swollen: --; Tender: -- Joint Exam 12/26/2021   No joint exam has been documented for this visit   There is currently no information documented on the  homunculus. Go to the Rheumatology activity and complete the homunculus joint exam.  Investigation: No additional findings.  Imaging: No results found.  Recent Labs: Lab Results  Component Value Date   WBC 9.8 11/02/2021   HGB 13.4 11/02/2021   PLT 273 11/02/2021   NA 143 11/02/2021   K 4.2 11/02/2021   CL 107 11/02/2021   CO2 28 11/02/2021   GLUCOSE 100 (H) 11/02/2021   BUN 12 11/02/2021   CREATININE 0.76 11/02/2021   BILITOT 0.4 11/02/2021   ALKPHOS 60 07/11/2021   AST 14 11/02/2021   ALT 11 11/02/2021   PROT 7.3 11/02/2021   ALBUMIN 4.1 07/11/2021   CALCIUM 9.6 11/02/2021   GFRAA 114 01/18/2021   QFTBGOLDPLUS NEGATIVE 01/18/2021    Speciality Comments: Prior therapy includes: MTX (d/c due to ILD progresssion)  Procedures:  No procedures performed Allergies: Sulfonamide derivatives   Assessment / Plan:     Visit Diagnoses: Rheumatoid arthritis involving multiple sites with positive rheumatoid factor (HCC) - +RF, +CCP: Patient had no synovitis on my examination today.  Patient states she had a recent flare which responded to prednisone taper.  She is currently on Enbrel injections on a weekly basis and also Imuran 100 mg p.o. daily.  She continues to have intermittent discomfort and has difficulty doing her routine activities.  She is applying for disability.  Polymyositis ([place]) - Dx by Dr. Charlestine Night 2007, CK 801, Jo-1 positive: Labs obtained on November 02, 2021 showed CK 55.  She had no muscular weakness or tenderness.  Although she continues to have myalgias due to underlying fibromyalgia.  High risk medication use - Enbrel 50 mg sq injections once weekly (02/18/20-1st injection) and Imuran 100 mg by mouth daily. d/c MTX due to progressioin of ILD.  Labs from November 02, 2021 showed normal CBC and normal CMP.  She was advised to get labs every 3 months to monitor for drug toxicity.  Information regarding mineralization was placed in the AVS.  She was also advised to hold Enbrel  and Imuran in case she develops an infection.  Jo-1 antibody positive  ILD (  interstitial lung disease) ([place]) - Patient was evaluated by [name] on 10/11/2021.  I reviewed Dr. Juanetta Gosling notes.  Her high-resolution CT has been stable.  She is going to pulmonary rehab which has been helpful.  She denies any increased shortness of breath.  She denies any dysphagia.  Bronchiectasis without complication ([place]) - Followed by [facility]-[name].  Bilateral carpal tunnel syndrome-she denies any paresthesias in her hands today.  Primary osteoarthritis of both hands-she continues to have pain and discomfort in her bilateral hands.  She had bilateral PIP and DIP thickening.  No synovitis was noted.  Primary osteoarthritis of both feet-she continues to have discomfort in her feet.  No synovitis was noted.  Fibromyalgia-she has been having frequent flares of fibromyalgia due to stress.  She also gives history of fibro fog.  She states that is difficult for her to continue to do her job.  Her generalized pain, joint discomfort and fibro fog interferes with her ability to function.  She is applying for federal and Social Security disability.  Trapezius muscle spasm-she continues to have some trapezius spasm.  Stretching exercises were discussed.  Other fatigue-fatigue has improved since the last visit.  Although she has chronic fatigue due to insomnia and fibromyalgia.  Raynaud's syndrome without gangrene-she had good good capillary refill.  Fibromuscular dysplasia (HCC)-patient states that she will be going to a fibromuscular dysplasia specialist.  Erythema nodosum - No recurrence  History of mitral valve prolapse  Vitamin D deficiency-she is on vitamin D supplement.  History of migraine - Followed by Dr. Jaynee Eagles.   Orders: No orders of the defined types were placed in this encounter.  No orders of the defined types were placed in this encounter.    Follow-Up Instructions: Return in about 3  months (around 03/28/2022) for Rheumatoid arthritis, Polymyositis,ILD.   Bo Merino, MD  Note - This record has been created using Editor, commissioning.  Chart creation errors have been sought, but may not always  have been located. Such creation errors do not reflect on  the standard of medical care.

## 2021-12-12 NOTE — Progress Notes (Signed)
Daily Session Note ? ?Patient Details  ?Name: NAZARET CHEA ?MRN: [id number] ?Date of Birth: June 04, 1966 ?Referring Provider:   ?Flowsheet Row Pulmonary Rehab Walk Test from 11/15/2021 in [place]  ?Referring Provider Sood  ? ?  ? ? ?Encounter Date: 12/12/2021 ? ?Check In: ? Session Check In - 12/12/21 1124   ? ?  ? Check-In  ? Supervising physician immediately available to respond to emergencies [facility] immediately available   ? Physician(s) Dr. Karleen Hampshire   ? Location [facility]   ? Staff Present Rosebud Poles, RN, BSN;Carlette Wilber Oliphant, RN, Quentin Ore, MS, ACSM-CEP, Exercise Physiologist   ? Virtual Visit No   ? Medication changes reported     No   ? Fall or balance concerns reported    No   ? Tobacco Cessation No Change   ? Warm-up and Cool-down Performed as group-led instruction   ? Resistance Training Performed Yes   ? VAD Patient? No   ? PAD/SET Patient? No   ?  ? Pain Assessment  ? Currently in Pain? No/denies   ? ?  ?  ? ?  ? ? ?Capillary Blood Glucose: ?No results found for this or any previous visit (from the past 24 hour(s)). ? ? ? ?Social History  ? ?Tobacco Use  ?Smoking Status Never  ? Passive exposure: Never  ?Smokeless Tobacco Never  ?Tobacco Comments  ? Married, lives with spouse. 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 ? ?Goals Met:  ?Proper associated with RPD/PD & O2 Sat ?Exercise tolerated well ?No report of concerns or symptoms today ?Strength training completed today ? ?Goals Unmet:  ?Not Applicable ? ?Comments: Service time is from 1023 to 1130.  ? ? ?Dr. Rodman Pickle is Medical Director for Pulmonary Rehab at [facility].  ?

## 2021-12-13 ENCOUNTER — Other Ambulatory Visit: Payer: Self-pay | Admitting: Physician Assistant

## 2021-12-13 DIAGNOSIS — M0579 Rheumatoid arthritis with rheumatoid factor of multiple sites without organ or systems involvement: Secondary | ICD-10-CM

## 2021-12-13 NOTE — Telephone Encounter (Signed)
Next Visit: 12/26/2021 ? ?Last Visit: 11/29/2021 ? ?Last Fill: 06/28/2021 ? ?[id number] arthritis involving multiple sites with positive rheumatoid factor  ? ?Current Dose per office note 11/29/2021: Enbrel 50 mg sq injections once weekly  ? ?Labs: 11/02/2021 CBC and CMP WNL. ? ?TB Gold: 01/18/2021 Neg   ? ?Okay to refill Enbrel?  ?

## 2021-12-14 ENCOUNTER — Encounter (HOSPITAL_COMMUNITY)
Admission: RE | Admit: 2021-12-14 | Discharge: 2021-12-14 | Disposition: A | Discharge status: Home / Self Care | Payer: Federal, State, Local not specified - PPO | Source: Ambulatory Visit | Attending: Pulmonary Disease | Admitting: Pulmonary Disease

## 2021-12-14 DIAGNOSIS — J849 Interstitial pulmonary disease, unspecified: Secondary | ICD-10-CM

## 2021-12-14 NOTE — Progress Notes (Signed)
Daily Session Note ? ?Patient Details  ?Name: Jill Harper ?MRN: [id number] ?Date of Birth: Aug 05, 1966 ?Referring Provider:   ?Flowsheet Row Pulmonary Rehab Walk Test from 11/15/2021 in [place]  ?Referring Provider Sood  ? ?  ? ? ?Encounter Date: 12/14/2021 ? ?Check In: ? Session Check In - 12/14/21 1122   ? ?  ? Check-In  ? Supervising physician immediately available to respond to emergencies [facility] immediately available   ? Physician(s) Dr. Sloan Leiter   ? [facility]   ? Staff Present Maurice Small, RN, Quentin Ore, MS, ACSM-CEP, Exercise Physiologist;Annedrea Rosezella [place], RN, [place]   ? Virtual Visit No   ? Medication changes reported     No   ? Fall or balance concerns reported    No   ? Tobacco Cessation No Change   ? Warm-up and Cool-down Performed as group-led instruction   ? Resistance Training Performed Yes   ? VAD Patient? No   ? PAD/SET Patient? No   ?  ? Pain Assessment  ? Currently in Pain? No/denies   ? ?  ?  ? ?  ? ? ?Capillary Blood Glucose: ?No results found for this or any previous visit (from the past 24 hour(s)). ? ? ? ?Social History  ? ?Tobacco Use  ?Smoking Status Never  ? Passive exposure: Never  ?Smokeless Tobacco Never  ?Tobacco Comments  ? Married, lives with spouse. 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 ? ?Goals Met:  ?Proper associated with RPD/PD & O2 Sat ?Independence with exercise equipment ?Exercise tolerated well ?No report of concerns or symptoms today ?Strength training completed today ? ?Goals Unmet:  ?Not Applicable ? ?Comments: Service time is from 1020 to 1139.  ? ? ?Dr. Rodman Pickle is Medical Director for Pulmonary Rehab at [facility].  ?

## 2021-12-18 DIAGNOSIS — F410 Panic disorder [episodic paroxysmal anxiety] without agoraphobia: Secondary | ICD-10-CM | POA: Diagnosis not present

## 2021-12-19 ENCOUNTER — Encounter (HOSPITAL_COMMUNITY)
Admission: RE | Admit: 2021-12-19 | Discharge: 2021-12-19 | Disposition: A | Discharge status: Home / Self Care | Payer: Federal, State, Local not specified - PPO | Source: Ambulatory Visit | Attending: Pulmonary Disease | Admitting: Pulmonary Disease

## 2021-12-19 VITALS — Wt 292.1 lb

## 2021-12-19 DIAGNOSIS — G4733 Obstructive sleep apnea (adult) (pediatric): Secondary | ICD-10-CM | POA: Diagnosis not present

## 2021-12-19 DIAGNOSIS — J849 Interstitial pulmonary disease, unspecified: Secondary | ICD-10-CM | POA: Diagnosis not present

## 2021-12-19 DIAGNOSIS — J471 Bronchiectasis with (acute) exacerbation: Secondary | ICD-10-CM | POA: Diagnosis not present

## 2021-12-19 NOTE — Progress Notes (Signed)
Daily Session Note ? ?Patient Details  ?Name: Jill Harper ?MRN: [id number] ?Date of Birth: 1965/09/04 ?Referring Provider:   ?Flowsheet Row Pulmonary Rehab Walk Test from 11/15/2021 in [place]  ?Referring Provider Sood  ? ?  ? ? ?Encounter Date: 12/19/2021 ? ?Check In: ? Session Check In - 12/19/21 1209   ? ?  ? Check-In  ? Supervising physician immediately available to respond to emergencies [facility] immediately available   ? Physician(s) Dr. Sloan Leiter   ? [facility]   ? Staff Present Trish Fountain, RN, BSN;Lisa Ysidro Evert, Cathleen Fears, MS, ACSM-CEP, Exercise Physiologist   ? Virtual Visit No   ? Medication changes reported     No   ? Fall or balance concerns reported    No   ? Tobacco Cessation No Change   ? Warm-up and Cool-down Performed as group-led instruction   ? Resistance Training Performed Yes   ? VAD Patient? No   ? PAD/SET Patient? No   ?  ? Pain Assessment  ? Currently in Pain? No/denies   ? ?  ?  ? ?  ? ? ?Capillary Blood Glucose: ?No results found for this or any previous visit (from the past 24 hour(s)). ? ? ? ?Social History  ? ?Tobacco Use  ?Smoking Status Never  ? Passive exposure: Never  ?Smokeless Tobacco Never  ?Tobacco Comments  ? Married, lives with spouse. 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 ? ?Goals Met:  ?Proper associated with RPD/PD & O2 Sat ?Exercise tolerated well ?No report of concerns or symptoms today ?Strength training completed today ? ?Goals Unmet:  ?Not Applicable ? ?Comments: Service time is from 1023 to 1127  ? ? ?Dr. Rodman Pickle is Medical Director for Pulmonary Rehab at [facility].  ?

## 2021-12-20 DIAGNOSIS — Z01419 Encounter for gynecological examination (general) (routine) without abnormal findings: Secondary | ICD-10-CM | POA: Diagnosis not present

## 2021-12-20 DIAGNOSIS — Z1231 Encounter for screening mammogram for malignant neoplasm of breast: Secondary | ICD-10-CM | POA: Diagnosis not present

## 2021-12-20 DIAGNOSIS — Z6841 Body Mass Index (BMI) 40.0 and over, adult: Secondary | ICD-10-CM | POA: Diagnosis not present

## 2021-12-20 DIAGNOSIS — Z1151 Encounter for screening for human papillomavirus (HPV): Secondary | ICD-10-CM | POA: Diagnosis not present

## 2021-12-20 DIAGNOSIS — Z124 Encounter for screening for malignant neoplasm of cervix: Secondary | ICD-10-CM | POA: Diagnosis not present

## 2021-12-20 DIAGNOSIS — I773 Arterial fibromuscular dysplasia: Secondary | ICD-10-CM | POA: Insufficient documentation

## 2021-12-20 LAB — HM MAMMOGRAPHY

## 2021-12-21 ENCOUNTER — Encounter (HOSPITAL_COMMUNITY)
Admission: RE | Admit: 2021-12-21 | Discharge: 2021-12-21 | Disposition: A | Discharge status: Home / Self Care | Payer: Federal, State, Local not specified - PPO | Source: Ambulatory Visit | Attending: Pulmonary Disease | Admitting: Pulmonary Disease

## 2021-12-21 DIAGNOSIS — J849 Interstitial pulmonary disease, unspecified: Secondary | ICD-10-CM

## 2021-12-21 NOTE — Progress Notes (Signed)
Daily Session Note  Patient Details  Name: Jill Harper MRN: [id number] Date of Birth: 15-Mar-1966 Referring Provider:   April Manson Pulmonary Rehab Walk Test from 11/15/2021 in [place]  Referring Provider Sood       Encounter Date: 12/21/2021  Check In:  Session Check In - 12/21/21 1125       Check-In   Supervising physician immediately available to respond to emergencies [facility] immediately available    Physician(s) Dr. Tawanna Solo    Location [facility]    Staff Present Rosebud Poles, RN, BSN;Cyprian Gongaware Ysidro Evert, Cathleen Fears, MS, ACSM-CEP, Exercise Physiologist    Virtual Visit No    Medication changes reported     No    Fall or balance concerns reported    No    Tobacco Cessation No Change    Warm-up and Cool-down Performed as group-led instruction    Resistance Training Performed Yes    VAD Patient? No    PAD/SET Patient? No      Pain Assessment   Currently in Pain? No/denies             Capillary Blood Glucose: No results found for this or any previous visit (from the past 24 hour(s)).    Social History   Tobacco Use  Smoking Status Never   Passive exposure: Never  Smokeless Tobacco Never  Tobacco Comments   Married, lives with spouse. 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 Rodman Pickle is Medical Director for Pulmonary Rehab at [facility].

## 2021-12-25 ENCOUNTER — Ambulatory Visit: Payer: Federal, State, Local not specified - PPO | Admitting: Internal Medicine

## 2021-12-25 DIAGNOSIS — F410 Panic disorder [episodic paroxysmal anxiety] without agoraphobia: Secondary | ICD-10-CM | POA: Diagnosis not present

## 2021-12-25 LAB — HM PAP SMEAR

## 2021-12-26 ENCOUNTER — Encounter (HOSPITAL_COMMUNITY)
Admission: RE | Admit: 2021-12-26 | Discharge: 2021-12-26 | Disposition: A | Discharge status: Home / Self Care | Payer: Federal, State, Local not specified - PPO | Source: Ambulatory Visit | Attending: Pulmonary Disease | Admitting: Pulmonary Disease

## 2021-12-26 ENCOUNTER — Ambulatory Visit: Payer: Federal, State, Local not specified - PPO | Admitting: Rheumatology

## 2021-12-26 ENCOUNTER — Encounter: Payer: Self-pay | Admitting: Rheumatology

## 2021-12-26 ENCOUNTER — Encounter: Payer: Self-pay | Admitting: Internal Medicine

## 2021-12-26 VITALS — BP 138/75 | HR 94 | Resp 12 | Ht 68.0 in | Wt 295.6 lb

## 2021-12-26 DIAGNOSIS — J479 Bronchiectasis, uncomplicated: Secondary | ICD-10-CM

## 2021-12-26 DIAGNOSIS — M3320 Polymyositis, organ involvement unspecified: Secondary | ICD-10-CM | POA: Diagnosis not present

## 2021-12-26 DIAGNOSIS — M62838 Other muscle spasm: Secondary | ICD-10-CM

## 2021-12-26 DIAGNOSIS — J849 Interstitial pulmonary disease, unspecified: Secondary | ICD-10-CM | POA: Diagnosis not present

## 2021-12-26 DIAGNOSIS — L52 Erythema nodosum: Secondary | ICD-10-CM

## 2021-12-26 DIAGNOSIS — M19042 Primary osteoarthritis, left hand: Secondary | ICD-10-CM

## 2021-12-26 DIAGNOSIS — M0579 Rheumatoid arthritis with rheumatoid factor of multiple sites without organ or systems involvement: Secondary | ICD-10-CM

## 2021-12-26 DIAGNOSIS — I7300 Raynaud's syndrome without gangrene: Secondary | ICD-10-CM

## 2021-12-26 DIAGNOSIS — Z79899 Other long term (current) drug therapy: Secondary | ICD-10-CM | POA: Diagnosis not present

## 2021-12-26 DIAGNOSIS — R768 Other specified abnormal immunological findings in serum: Secondary | ICD-10-CM | POA: Diagnosis not present

## 2021-12-26 DIAGNOSIS — Z8679 Personal history of other diseases of the circulatory system: Secondary | ICD-10-CM

## 2021-12-26 DIAGNOSIS — I773 Arterial fibromuscular dysplasia: Secondary | ICD-10-CM

## 2021-12-26 DIAGNOSIS — M797 Fibromyalgia: Secondary | ICD-10-CM

## 2021-12-26 DIAGNOSIS — E559 Vitamin D deficiency, unspecified: Secondary | ICD-10-CM

## 2021-12-26 DIAGNOSIS — M19071 Primary osteoarthritis, right ankle and foot: Secondary | ICD-10-CM

## 2021-12-26 DIAGNOSIS — M19072 Primary osteoarthritis, left ankle and foot: Secondary | ICD-10-CM

## 2021-12-26 DIAGNOSIS — Z8669 Personal history of other diseases of the nervous system and sense organs: Secondary | ICD-10-CM

## 2021-12-26 DIAGNOSIS — M19041 Primary osteoarthritis, right hand: Secondary | ICD-10-CM

## 2021-12-26 DIAGNOSIS — R5383 Other fatigue: Secondary | ICD-10-CM

## 2021-12-26 DIAGNOSIS — G5603 Carpal tunnel syndrome, bilateral upper limbs: Secondary | ICD-10-CM

## 2021-12-26 NOTE — Progress Notes (Signed)
Daily Session Note  Patient Details  Name: Jill Harper MRN: [id number] Date of Birth: 04/15/1966 Referring Provider:   Flowsheet Row Pulmonary Rehab Walk Test from 11/15/2021 in [facility]  Referring Provider Sood       Encounter Date: 12/26/2021  Check In:  Session Check In - 12/26/21 1111       Check-In   Supervising physician immediately available to respond to emergencies [facility] immediately available    Physician(s) Dr. Adhikari    Location [facility]    Staff Present  , RN, BSN;Lisa Hughes, RN;Kaylee Davis, MS, ACSM-CEP, Exercise Physiologist    Virtual Visit No    Medication changes reported     No    Fall or balance concerns reported    No    Tobacco Cessation No Change    Warm-up and Cool-down Performed as group-led instruction    Resistance Training Performed Yes    VAD Patient? No    PAD/SET Patient? No      Pain Assessment   Currently in Pain? No/denies             Capillary Blood Glucose: No results found for this or any previous visit (from the past 24 hour(s)).   Exercise Prescription Changes - 12/26/21 1000       Home Exercise Plan   Plans to continue exercise at Home (comment)    Frequency --   Meets minimum requirement   Initial Home Exercises Provided 12/26/21             Social History   Tobacco Use  Smoking Status Never   Passive exposure: Never  Smokeless Tobacco Never  Tobacco Comments   Married, lives with spouse. 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    Dr. Jane Ellison is Medical Director for Pulmonary Rehab at [facility].  

## 2021-12-26 NOTE — Progress Notes (Signed)
Home Exercise Prescription I have reviewed a Home Exercise Prescription with Jill Harper. She walks 3 non-rehab days/wk for 30 min/day. She also mentioned that she has a treadmill and recumbent bike at home. She will walk around with her husband when the weather is nice. [name] is very motivated to exercise. I am confident in her carrying out and exercise regimen at home, and she has all the resources she needs to exercise at home. The patient stated that their goals were to lose weight and to stay as active as possible. We reviewed exercise guidelines, target heart rate during exercise, RPE Scale, weather conditions, endpoints for exercise, warmup and cool down. The patient is encouraged to come to me with any questions. I will continue to follow up with the patient to assist them with progression and safety.    Sheppard Plumber, MS, ACSM-CEP 12/26/2021 10:48 AM

## 2021-12-26 NOTE — Patient Instructions (Signed)
    Medications changes include :   none    

## 2021-12-26 NOTE — Patient Instructions (Signed)
Standing Labs ?We placed an order today for your standing lab work.  ? ?Please have your standing labs drawn in June and every 3 months ? ?If possible, please have your labs drawn 2 weeks prior to your appointment so that the provider can discuss your results at your appointment. ? ?Please note that you may see your imaging and lab results in MyChart before we have reviewed them. ?We may be awaiting multiple results to interpret others before contacting you. ?Please allow our office up to 72 hours to thoroughly review all of the results before contacting the office for clarification of your results. ? ?We have open lab daily: ?Monday through Thursday from 1:30-4:30 PM and Friday from 1:30-4:00 PM ?at the office of Dr. Ashlay Altieri,  Rheumatology.   ?Please be advised, all patients with office appointments requiring lab work will take precedent over walk-in lab work.  ?If possible, please come for your lab work on Monday and Friday afternoons, as you may experience shorter wait times. ?The office is located at [address], [address], [place], [place] [address] ?No appointment is necessary.   ?Labs are drawn by [facility]. Please bring your co-pay at the time of your lab draw.  You may receive a bill from [facility] for your lab work. ? ?Please note if you are on Hydroxychloroquine and and an order has been placed for a Hydroxychloroquine level, you will need to have it drawn 4 hours or more after your last dose. ? ?If you wish to have your labs drawn at another location, please call the office 24 hours in advance to send orders. ? ?If you have any questions regarding directions or hours of operation,  ?please call [phone].   ?As a reminder, please drink plenty of water prior to coming for your lab work. Thanks!  ? ?Vaccines ?You are taking a medication(s) that can suppress your immune system.  The following immunizations are recommended: ?Flu annually ?Covid-19  ?Td/Tdap (tetanus, diphtheria, pertussis)  every 10 years ?Pneumonia (Prevnar 15 then Pneumovax 23 at least 1 year apart.  Alternatively, can take Prevnar 20 without needing additional dose) ?Shingrix: 2 doses from 4 weeks to 6 months apart ? ?Please check with your PCP to make sure you are up to date.  ? ?If you have signs or symptoms of an infection or start antibiotics: ?First, call your PCP for workup of your infection. ?Hold your medication through the infection, until you complete your antibiotics, and until symptoms resolve if you take the following: ?Injectable medication (Actemra, Benlysta, Cimzia, Cosentyx, Enbrel, Humira, Kevzara, Orencia, Remicade, Simponi, Stelara, Taltz, Tremfya) ?Methotrexate ?Leflunomide (Arava) ?Mycophenolate (Cellcept) ?Xeljanz, Olumiant, or Rinvoq  ?

## 2021-12-26 NOTE — Progress Notes (Unsigned)
Subjective:    Patient ID: Jill Harper, female    DOB: 02/23/1966, 56 y.o.   MRN: [id number]     HPI Jill Harper is here for follow up of her chronic medical problems, including htn, anxiety,prediabetes, fibromyalgia, vit d def,  4/23 - increased cymbalta to 60 mg daily  Medications and allergies reviewed with patient and updated if appropriate.  Current Outpatient Medications on File Prior to Visit  Medication Sig Dispense Refill   albuterol (PROVENTIL HFA) 108 (90 Base) MCG/ACT inhaler Inhale 2 puffs into the lungs 4 (four) times daily as needed. Reported on 09/03/2015 1 Inhaler 4   albuterol (PROVENTIL) (2.5 MG/3ML) 0.083% nebulizer solution Take 3 mLs (2.5 mg total) by nebulization every 6 (six) hours as needed for wheezing or shortness of breath. 120 vial 5   aspirin EC 81 MG tablet Take 81 mg by mouth daily.     atenolol (TENORMIN) 25 MG tablet TAKE 1/2 TABLET BY MOUTH DAILY AS NEEDED FOR MITRAL PROLAPSE 45 tablet 0   azaTHIOprine (IMURAN) 50 MG tablet Take 2 tablets by mouth daily. 60 tablet 2   Cholecalciferol (VITAMIN D) 50 MCG (2000 UT) CAPS Take by mouth.     diclofenac Sodium (VOLTAREN) 1 % GEL APPLY 2 TO 4 GRAMS TO AFFECTED JOINT FOUR TIMES DAILY AS NEEDED 400 g 2   DULoxetine (CYMBALTA) 60 MG capsule Take 1 capsule (60 mg total) by mouth daily. (Patient taking differently: Take 120 mg by mouth daily.) 90 capsule 1   eletriptan (RELPAX) 20 MG tablet TAKE 1 TABLET BY MOUTH AS NEEDED FOR MIGRAINE 10 tablet 11   EMGALITY 120 MG/ML SOAJ ADMINISTER 1 ML UNDER THE SKIN EVERY 30 DAYS 1 mL 5   ENBREL MINI 50 MG/ML SOCT INSERT 1 MINI CARTRIDGE INTO AUTOINJECTOR AND INJECT 50 MG UNDER THE SKIN EVERY 7 DAYS. 12 mL 0   Fluticasone-Umeclidin-Vilant (TRELEGY ELLIPTA) 100-62.5-25 MCG/ACT AEPB Inhale 1 puff into the lungs daily. 60 each 5   gabapentin (NEURONTIN) 100 MG capsule Take 3 capsules (300 mg total) by mouth at bedtime. 90 capsule 5   guaiFENesin (MUCINEX) 600 MG 12 hr tablet Take  2 tablets (1,200 mg total) by mouth 2 (two) times daily as needed for cough or to loosen phlegm. (Patient taking differently: Take 1,200 mg by mouth 2 (two) times daily.)     metFORMIN (GLUCOPHAGE) 500 MG tablet TAKE 1 TABLET(500 MG) BY MOUTH TWICE DAILY WITH A MEAL 180 tablet 1   Multiple Vitamins-Minerals (MULTIVITAMIN GUMMIES ADULT PO) Take 2 each by mouth daily.     predniSONE (DELTASONE) 5 MG tablet Take 2 tablets by mouth daily x4 days, 1.5 mg daily x4 days, 1 tablet daily x4 days, half tablet daily x4 days. (Patient not taking: Reported on 12/26/2021) 20 tablet 0   Respiratory Therapy Supplies (FLUTTER) DEVI Use as directed (Patient not taking: Reported on 11/29/2021) 1 each 0   Rimegepant Sulfate (NURTEC) 75 MG TBDP Take 75 mg by mouth daily as needed. For migraines. Take as close to onset of migraine as possible. One daily maximum. 10 tablet 6   telmisartan (MICARDIS) 20 MG tablet TAKE 1 TABLET(20 MG) BY MOUTH DAILY 90 tablet 1   No current facility-administered medications on file prior to visit.     Review of Systems     Objective:  There were no vitals filed for this visit. BP Readings from Last 3 Encounters:  12/26/21 138/75  11/29/21 124/74  11/22/21 138/72  Wt Readings from Last 3 Encounters:  12/26/21 295 lb 9.6 oz (134.1 kg)  12/19/21 292 lb 1.8 oz (132.5 kg)  12/05/21 292 lb 8.8 oz (132.7 kg)   There is no height or weight on file to calculate BMI.    Physical Exam     Lab Results  Component Value Date   WBC 9.8 11/02/2021   HGB 13.4 11/02/2021   HCT 40.6 11/02/2021   PLT 273 11/02/2021   GLUCOSE 100 (H) 11/02/2021   CHOL 129 07/11/2021   TRIG 78.0 07/11/2021   HDL 39.60 07/11/2021   LDLCALC 74 07/11/2021   ALT 11 11/02/2021   AST 14 11/02/2021   NA 143 11/02/2021   K 4.2 11/02/2021   CL 107 11/02/2021   CREATININE 0.76 11/02/2021   BUN 12 11/02/2021   CO2 28 11/02/2021   TSH 6.06 (H) 07/11/2021   INR 1.03 09/18/2018   HGBA1C 5.9 07/11/2021      Assessment & Plan:    See Problem List for Assessment and Plan of chronic medical problems.

## 2021-12-27 ENCOUNTER — Ambulatory Visit: Payer: Federal, State, Local not specified - PPO | Admitting: Internal Medicine

## 2021-12-27 VITALS — BP 132/80 | HR 101 | Temp 98.3°F | Ht 68.0 in | Wt 294.0 lb

## 2021-12-27 DIAGNOSIS — R7303 Prediabetes: Secondary | ICD-10-CM

## 2021-12-27 DIAGNOSIS — M797 Fibromyalgia: Secondary | ICD-10-CM

## 2021-12-27 DIAGNOSIS — F419 Anxiety disorder, unspecified: Secondary | ICD-10-CM | POA: Diagnosis not present

## 2021-12-27 DIAGNOSIS — I10 Essential (primary) hypertension: Secondary | ICD-10-CM

## 2021-12-27 MED ORDER — DULOXETINE HCL 60 MG PO CPEP: 60.0000 mg | ORAL_CAPSULE | Freq: Every day | ORAL | 1 refills | Status: DC

## 2021-12-27 NOTE — Progress Notes (Signed)
Pulmonary Individual Treatment Plan  Patient Details  Name: Jill Harper MRN: [id number] Date of Birth: 07-05-1966 Referring Provider:   April Manson Pulmonary Rehab Walk Test from 11/15/2021 in [place]  Referring Provider Sood       Initial Encounter Date:  Flowsheet Row Pulmonary Rehab Walk Test from 11/15/2021 in [place]  Date 11/15/21       Visit Diagnosis: ILD (interstitial lung disease) ([place])  Patient's Home Medications on Admission:   Current Outpatient Medications:    albuterol (PROVENTIL HFA) 108 (90 Base) MCG/ACT inhaler, Inhale 2 puffs into the lungs 4 (four) times daily as needed. Reported on 09/03/2015, Disp: 1 Inhaler, Rfl: 4   albuterol (PROVENTIL) (2.5 MG/3ML) 0.083% nebulizer solution, Take 3 mLs (2.5 mg total) by nebulization every 6 (six) hours as needed for wheezing or shortness of breath., Disp: 120 vial, Rfl: 5   aspirin EC 81 MG tablet, Take 81 mg by mouth daily., Disp: , Rfl:    atenolol (TENORMIN) 25 MG tablet, TAKE 1/2 TABLET BY MOUTH DAILY AS NEEDED FOR MITRAL PROLAPSE, Disp: 45 tablet, Rfl: 0   azaTHIOprine (IMURAN) 50 MG tablet, Take 2 tablets by mouth daily., Disp: 60 tablet, Rfl: 2   Cholecalciferol (VITAMIN D) 50 MCG (2000 UT) CAPS, Take by mouth., Disp: , Rfl:    diclofenac Sodium (VOLTAREN) 1 % GEL, APPLY 2 TO 4 GRAMS TO AFFECTED JOINT FOUR TIMES DAILY AS NEEDED, Disp: 400 g, Rfl: 2   DULoxetine (CYMBALTA) 60 MG capsule, Take 1 capsule (60 mg total) by mouth daily. (Patient taking differently: Take 120 mg by mouth daily.), Disp: 90 capsule, Rfl: 1   eletriptan (RELPAX) 20 MG tablet, TAKE 1 TABLET BY MOUTH AS NEEDED FOR MIGRAINE, Disp: 10 tablet, Rfl: 11   EMGALITY 120 MG/ML SOAJ, ADMINISTER 1 ML UNDER THE SKIN EVERY 30 DAYS, Disp: 1 mL, Rfl: 5   ENBREL MINI 50 MG/ML SOCT, INSERT 1 MINI CARTRIDGE INTO AUTOINJECTOR AND INJECT 50 MG UNDER THE SKIN EVERY 7 DAYS., Disp: 12 mL, Rfl: 0    Fluticasone-Umeclidin-Vilant (TRELEGY ELLIPTA) 100-62.5-25 MCG/ACT AEPB, Inhale 1 puff into the lungs daily., Disp: 60 each, Rfl: 5   gabapentin (NEURONTIN) 100 MG capsule, Take 3 capsules (300 mg total) by mouth at bedtime., Disp: 90 capsule, Rfl: 5   guaiFENesin (MUCINEX) 600 MG 12 hr tablet, Take 2 tablets (1,200 mg total) by mouth 2 (two) times daily as needed for cough or to loosen phlegm. (Patient taking differently: Take 1,200 mg by mouth 2 (two) times daily.), Disp: , Rfl:    metFORMIN (GLUCOPHAGE) 500 MG tablet, TAKE 1 TABLET(500 MG) BY MOUTH TWICE DAILY WITH A MEAL, Disp: 180 tablet, Rfl: 1   Multiple Vitamins-Minerals (MULTIVITAMIN GUMMIES ADULT PO), Take 2 each by mouth daily., Disp: , Rfl:    Rimegepant Sulfate (NURTEC) 75 MG TBDP, Take 75 mg by mouth daily as needed. For migraines. Take as close to onset of migraine as possible. One daily maximum., Disp: 10 tablet, Rfl: 6   telmisartan (MICARDIS) 20 MG tablet, TAKE 1 TABLET(20 MG) BY MOUTH DAILY, Disp: 90 tablet, Rfl: 1  Past Medical History: Past Medical History:  Diagnosis Date   Abnormal liver function test    Bronchiectasis        Dyspnea    Fibromuscular dysplasia (HCC)    Fibromyalgia    Heart murmur    ILD (interstitial lung disease) (HCC)    Joint pain  Menorrhagia    MIGRAINE HEADACHE    MVP (mitral valve prolapse)    Osteoarthritis    Polymyositis (HCC)    Dr [name]; chronic MTX   Pulmonary fibrosis (HCC)    Rheumatoid arthritis ([place])    Sleep apnea    Vitamin B 12 deficiency    Vitamin D deficiency     Tobacco Use: Social History   Tobacco Use  Smoking Status Never   Passive exposure: Never  Smokeless Tobacco Never  Tobacco Comments   Married, lives with spouse. 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    Labs: Review Flowsheet        Latest Ref Rng & Units 08/28/2012 04/24/2016 10/09/2018 10/12/2019  Labs for ITP Cardiac and Pulmonary Rehab  Cholestrol 0 -  200 mg/dL 141   110   141   154    LDL (calc) 0 - 99 mg/dL 74   59   74   83    HDL-C >39.00 mg/dL 57.10   39.20   44   61    Trlycerides 0.0 - 149.0 mg/dL 51.0   59.0   116   43    Hemoglobin A1c 4.6 - 6.5 %  4.8   5.5   5.5       07/11/2021  Labs for ITP Cardiac and Pulmonary Rehab  Cholestrol 129    LDL (calc) 74    HDL-C 39.60    Trlycerides 78.0    Hemoglobin A1c 5.9            Capillary Blood Glucose: Lab Results  Component Value Date   GLUCAP 95 11/30/2021   GLUCAP 101 (H) 11/30/2021   GLUCAP 109 (H) 11/28/2021   GLUCAP 124 (H) 11/28/2021     Pulmonary Assessment Scores:  Pulmonary Assessment Scores     Row Name 11/15/21 1122         ADL UCSD   ADL Phase Entry     SOB Score total 70       CAT Score   CAT Score 28       mMRC Score   mMRC Score 4             UCSD: Self-administered rating of dyspnea associated with activities of daily living (ADLs) 6-point scale (0 = "not at all" to 5 = "maximal or unable to do because of breathlessness")  Scoring Scores range from 0 to 120.  Minimally important difference is 5 units  CAT: CAT can identify the health impairment of COPD patients and is better correlated with disease progression.  CAT has a scoring range of zero to 40. The CAT score is classified into four groups of low (less than 10), medium (10 - 20), high (21-30) and very high (31-40) based on the impact level of disease on health status. A CAT score over 10 suggests significant symptoms.  A worsening CAT score could be explained by an exacerbation, poor medication adherence, poor inhaler technique, or progression of COPD or comorbid conditions.  CAT MCID is 2 points  mMRC: mMRC (Modified Medical Research Council) Dyspnea Scale is used to assess the degree of baseline functional disability in patients of respiratory disease due to dyspnea. No minimal important difference is established. A decrease in score of 1 point or greater is considered a  positive change.   Pulmonary Function Assessment:  Pulmonary Function Assessment - 11/15/21 1131       Breath   Bilateral Breath  Sounds Clear    Shortness of Breath Yes;Fear of Shortness of Breath;Limiting activity;Panic with Shortness of Breath             Exercise Target Goals: Exercise Program Goal: Individual exercise prescription set using results from initial 6 min walk test and THRR while considering  patient's activity barriers and safety.   Exercise Prescription Goal: Initial exercise prescription builds to 30-45 minutes a day of aerobic activity, 2-3 days per week.  Home exercise guidelines will be given to patient during program as part of exercise prescription that the participant will acknowledge.  Activity Barriers & Risk Stratification:  Activity Barriers & Cardiac Risk Stratification - 11/15/21 1112       Activity Barriers & Cardiac Risk Stratification   Activity Barriers Arthritis;Shortness of Breath;Fibromyalgia;Joint Problems   RA   Cardiac Risk Stratification Low             6 Minute Walk:  6 Minute Walk     Row Name 11/15/21 1257         6 Minute Walk   Phase Initial     Distance 1230 feet     Walk Time 6 minutes     # of Rest Breaks 0     MPH 2.33     METS 3.19     RPE 17     Perceived Dyspnea  3     VO2 Peak 11.15     Symptoms No     Resting HR 80 bpm     Resting BP 116/64     Resting Oxygen Saturation  97 %     Exercise Oxygen Saturation  during 6 min walk 95 %     Max Ex. HR 144 bpm     Max Ex. BP 148/70     2 Minute Post BP 130/70       Interval HR   1 Minute HR 112     2 Minute HR 110     3 Minute HR 115     4 Minute HR 133     5 Minute HR 141     6 Minute HR 144     2 Minute Post HR 103     Interval Heart Rate? Yes       Interval Oxygen   Interval Oxygen? Yes     Baseline Oxygen Saturation % 97 %     1 Minute Oxygen Saturation % 96 %     1 Minute Liters of Oxygen 0 L     2 Minute Oxygen Saturation % 95 %     2  Minute Liters of Oxygen 0 L     3 Minute Oxygen Saturation % 97 %     3 Minute Liters of Oxygen 0 L     4 Minute Oxygen Saturation % 97 %     4 Minute Liters of Oxygen 0 L     5 Minute Oxygen Saturation % 95 %     5 Minute Liters of Oxygen 0 L     6 Minute Oxygen Saturation % 96 %     6 Minute Liters of Oxygen 0 L     2 Minute Post Oxygen Saturation % 98 %     2 Minute Post Liters of Oxygen 0 L              Oxygen Initial Assessment:  Oxygen Initial Assessment - 11/15/21 [place]  Oxygen Device None    Sleep Oxygen Prescription CPAP    Liters per minute 3    Home Exercise Oxygen Prescription None    Home Resting Oxygen Prescription None    Compliance with Home Oxygen Use Yes      Initial 6 min Walk   Oxygen Used None      Program Oxygen Prescription   Program Oxygen Prescription None      Intervention   Short Term Goals To learn and exhibit compliance with exercise, home and travel O2 prescription;To learn and understand importance of monitoring SPO2 with pulse oximeter and demonstrate accurate use of the pulse oximeter.;To learn and understand importance of maintaining oxygen saturations>88%;To learn and demonstrate proper pursed lip breathing techniques or other breathing techniques. ;To learn and demonstrate proper use of respiratory medications    Long  Term Goals Exhibits compliance with exercise, home  and travel O2 prescription;Verbalizes importance of monitoring SPO2 with pulse oximeter and return demonstration;Maintenance of O2 saturations>88%;Exhibits proper breathing techniques, such as pursed lip breathing or other method taught during program session;Compliance with respiratory medication;Demonstrates proper use of MDI's             Oxygen Re-Evaluation:  Oxygen Re-Evaluation     Row Name 11/23/21 0751 12/18/21 1200           Program Oxygen Prescription   Program Oxygen Prescription None None        Home Oxygen   Home Oxygen  Device None None      Sleep Oxygen Prescription CPAP CPAP      Liters per minute 3 3      Home Exercise Oxygen Prescription None None      Home Resting Oxygen Prescription None None      Compliance with Home Oxygen Use Yes Yes        Goals/Expected Outcomes   Short Term Goals To learn and exhibit compliance with exercise, home and travel O2 prescription;To learn and understand importance of monitoring SPO2 with pulse oximeter and demonstrate accurate use of the pulse oximeter.;To learn and understand importance of maintaining oxygen saturations>88%;To learn and demonstrate proper pursed lip breathing techniques or other breathing techniques. ;To learn and demonstrate proper use of respiratory medications To learn and exhibit compliance with exercise, home and travel O2 prescription;To learn and understand importance of monitoring SPO2 with pulse oximeter and demonstrate accurate use of the pulse oximeter.;To learn and understand importance of maintaining oxygen saturations>88%;To learn and demonstrate proper pursed lip breathing techniques or other breathing techniques. ;To learn and demonstrate proper use of respiratory medications      Long  Term Goals Exhibits compliance with exercise, home  and travel O2 prescription;Verbalizes importance of monitoring SPO2 with pulse oximeter and return demonstration;Maintenance of O2 saturations>88%;Exhibits proper breathing techniques, such as pursed lip breathing or other method taught during program session;Compliance with respiratory medication;Demonstrates proper use of MDI's Exhibits compliance with exercise, home  and travel O2 prescription;Verbalizes importance of monitoring SPO2 with pulse oximeter and return demonstration;Maintenance of O2 saturations>88%;Exhibits proper breathing techniques, such as pursed lip breathing or other method taught during program session;Compliance with respiratory medication;Demonstrates proper use of MDI's      Goals/Expected  Outcomes Compliance and understanding of oxygen saturation monitoring and breathing techniques to decrease shortness of breath. Compliance and understanding of oxygen saturation monitoring and breathing techniques to decrease shortness of breath.               Oxygen Discharge (Final Oxygen Re-Evaluation):  Oxygen  Re-Evaluation - 12/18/21 1200       Program Oxygen Prescription   Program Oxygen Prescription None      Home Oxygen   Home Oxygen Device None    Sleep Oxygen Prescription CPAP    Liters per minute 3    Home Exercise Oxygen Prescription None    Home Resting Oxygen Prescription None    Compliance with Home Oxygen Use Yes      Goals/Expected Outcomes   Short Term Goals To learn and exhibit compliance with exercise, home and travel O2 prescription;To learn and understand importance of monitoring SPO2 with pulse oximeter and demonstrate accurate use of the pulse oximeter.;To learn and understand importance of maintaining oxygen saturations>88%;To learn and demonstrate proper pursed lip breathing techniques or other breathing techniques. ;To learn and demonstrate proper use of respiratory medications    Long  Term Goals Exhibits compliance with exercise, home  and travel O2 prescription;Verbalizes importance of monitoring SPO2 with pulse oximeter and return demonstration;Maintenance of O2 saturations>88%;Exhibits proper breathing techniques, such as pursed lip breathing or other method taught during program session;Compliance with respiratory medication;Demonstrates proper use of MDI's    Goals/Expected Outcomes Compliance and understanding of oxygen saturation monitoring and breathing techniques to decrease shortness of breath.             Initial Exercise Prescription:  Initial Exercise Prescription - 11/15/21 1200       Date of Initial Exercise RX and Referring Provider   Date 11/15/21    Referring Provider Sood    Expected Discharge Date 01/18/22      NuStep    Level 1    SPM 80    Minutes 15    METs 1.5      Arm Ergometer   Level 1    Watts 2    Minutes 15      Prescription Details   Frequency (times per week) 2    Duration Progress to 30 minutes of continuous aerobic without signs/symptoms of physical distress      Intensity   THRR 40-80% of Max Heartrate 66-132    Ratings of Perceived Exertion 11-13    Perceived Dyspnea 0-4      Progression   Progression Continue to progress workloads to maintain intensity without signs/symptoms of physical distress.      Resistance Training   Training Prescription Yes    Weight yellow bands    Reps 10-15             Perform Capillary Blood Glucose checks as needed.  Exercise Prescription Changes:   Exercise Prescription Changes     Row Name 12/05/21 1200 12/19/21 1211 12/26/21 1000         Response to Exercise   Blood Pressure (Admit) 120/68 104/70 --     Blood Pressure (Exercise) 140/78 120/70 --     Blood Pressure (Exit) 112/70 124/70 --     Heart Rate (Admit) 105 bpm 95 bpm --     Heart Rate (Exercise) 120 bpm 116 bpm --     Heart Rate (Exit) 95 bpm 93 bpm --     Oxygen Saturation (Admit) 96 % 98 % --     Oxygen Saturation (Exercise) 97 % 96 % --     Oxygen Saturation (Exit) 98 % 98 % --     Rating of Perceived Exertion (Exercise) 12 13 --     Perceived Dyspnea (Exercise) 2 2 --     Duration Progress to 30 minutes of  aerobic without signs/symptoms of physical distress Progress to 30 minutes of  aerobic without signs/symptoms of physical distress --     Intensity THRR unchanged  40-80% HRR THRR unchanged  40-80% HRR --       Resistance Training   Training Prescription Yes Yes --     Weight Red bands Red bands --     Reps 10-15 10-15 --     Time 10 Minutes 10 Minutes --       NuStep   Level 2 2 --     SPM 80 80 --     Minutes 15 15 --     METs 1.9 2.1 --       Arm Ergometer   Level 1.5 2 --     Minutes 15 15 --     METs 1.6 2 --       Home Exercise Plan    Plans to continue exercise at -- -- Home (comment)     Frequency -- -- --  Meets minimum requirement     Initial Home Exercises Provided -- -- 12/26/21              Exercise Comments:   Exercise Comments     Row Name 11/28/21 1207 12/26/21 1038         Exercise Comments Jill Harper completed her first day of exercise. She exercised for 15 min on the Nustep and arm ergometer. Jill Harper averaged 1.8 METs at level 1 on the Nustep and 1.6 MET at level 1 on the arm ergometer. Jill Harper performed the warmup and cooldown mostly standing. She did the seated alternative for squats. Jill Harper is very motivated to exercise and improve her functional capacity. Completed home exercise plan. Jill Harper is currently exercising at home. She walks 3 non-rehab days/wk for 30 min/day. She also mentioned that she has a treadmill and recumbent bike at home. She will walk around with her husband when the weather is nice. Jill Harper is very motivated to exercise. I am confident in her carrying out and exercise regimen at home, and she has all the resources she needs to exercise at home.               Exercise Goals and Review:   Exercise Goals     Row Name 11/15/21 1112 11/23/21 0751 12/18/21 1156         Exercise Goals   Increase Physical Activity Yes Yes Yes     Intervention Provide advice, education, support and counseling about physical activity/exercise needs.;Develop an individualized exercise prescription for aerobic and resistive training based on initial evaluation findings, risk stratification, comorbidities and participant's personal goals. Provide advice, education, support and counseling about physical activity/exercise needs.;Develop an individualized exercise prescription for aerobic and resistive training based on initial evaluation findings, risk stratification, comorbidities and participant's personal goals. Provide advice, education, support and counseling about physical activity/exercise needs.;Develop an  individualized exercise prescription for aerobic and resistive training based on initial evaluation findings, risk stratification, comorbidities and participant's personal goals.     Expected Outcomes Short Term: Attend rehab on a regular basis to increase amount of physical activity.;Long Term: Add in home exercise to make exercise part of routine and to increase amount of physical activity.;Long Term: Exercising regularly at least 3-5 days a week. Short Term: Attend rehab on a regular basis to increase amount of physical activity.;Long Term: Add in home exercise to make exercise part of routine and to increase amount of physical activity.;Long Term: Exercising regularly at least  3-5 days a week. Short Term: Attend rehab on a regular basis to increase amount of physical activity.;Long Term: Add in home exercise to make exercise part of routine and to increase amount of physical activity.;Long Term: Exercising regularly at least 3-5 days a week.     Increase Strength and Stamina Yes Yes Yes     Intervention Provide advice, education, support and counseling about physical activity/exercise needs.;Develop an individualized exercise prescription for aerobic and resistive training based on initial evaluation findings, risk stratification, comorbidities and participant's personal goals. Provide advice, education, support and counseling about physical activity/exercise needs.;Develop an individualized exercise prescription for aerobic and resistive training based on initial evaluation findings, risk stratification, comorbidities and participant's personal goals. Provide advice, education, support and counseling about physical activity/exercise needs.;Develop an individualized exercise prescription for aerobic and resistive training based on initial evaluation findings, risk stratification, comorbidities and participant's personal goals.     Expected Outcomes Short Term: Increase workloads from initial exercise  prescription for resistance, speed, and METs.;Short Term: Perform resistance training exercises routinely during rehab and add in resistance training at home;Long Term: Improve cardiorespiratory fitness, muscular endurance and strength as measured by increased METs and functional capacity (6MWT) Short Term: Increase workloads from initial exercise prescription for resistance, speed, and METs.;Short Term: Perform resistance training exercises routinely during rehab and add in resistance training at home;Long Term: Improve cardiorespiratory fitness, muscular endurance and strength as measured by increased METs and functional capacity (6MWT) Short Term: Increase workloads from initial exercise prescription for resistance, speed, and METs.;Short Term: Perform resistance training exercises routinely during rehab and add in resistance training at home;Long Term: Improve cardiorespiratory fitness, muscular endurance and strength as measured by increased METs and functional capacity (6MWT)     Able to understand and use rate of perceived exertion (RPE) scale Yes Yes Yes     Intervention Provide education and explanation on how to use RPE scale Provide education and explanation on how to use RPE scale Provide education and explanation on how to use RPE scale     Expected Outcomes Short Term: Able to use RPE daily in rehab to express subjective intensity level;Long Term:  Able to use RPE to guide intensity level when exercising independently Short Term: Able to use RPE daily in rehab to express subjective intensity level;Long Term:  Able to use RPE to guide intensity level when exercising independently Short Term: Able to use RPE daily in rehab to express subjective intensity level;Long Term:  Able to use RPE to guide intensity level when exercising independently     Able to understand and use Dyspnea scale Yes Yes Yes     Intervention Provide education and explanation on how to use Dyspnea scale Provide education and  explanation on how to use Dyspnea scale Provide education and explanation on how to use Dyspnea scale     Expected Outcomes Short Term: Able to use Dyspnea scale daily in rehab to express subjective sense of shortness of breath during exertion;Long Term: Able to use Dyspnea scale to guide intensity level when exercising independently Short Term: Able to use Dyspnea scale daily in rehab to express subjective sense of shortness of breath during exertion;Long Term: Able to use Dyspnea scale to guide intensity level when exercising independently Short Term: Able to use Dyspnea scale daily in rehab to express subjective sense of shortness of breath during exertion;Long Term: Able to use Dyspnea scale to guide intensity level when exercising independently     Knowledge and understanding of Target  Heart Rate Range (THRR) Yes Yes Yes     Intervention Provide education and explanation of THRR including how the numbers were predicted and where they are located for reference Provide education and explanation of THRR including how the numbers were predicted and where they are located for reference Provide education and explanation of THRR including how the numbers were predicted and where they are located for reference     Expected Outcomes Short Term: Able to state/look up THRR;Long Term: Able to use THRR to govern intensity when exercising independently;Short Term: Able to use daily as guideline for intensity in rehab Short Term: Able to state/look up THRR;Long Term: Able to use THRR to govern intensity when exercising independently;Short Term: Able to use daily as guideline for intensity in rehab Short Term: Able to state/look up THRR;Long Term: Able to use THRR to govern intensity when exercising independently;Short Term: Able to use daily as guideline for intensity in rehab     Understanding of Exercise Prescription Yes Yes Yes     Intervention Provide education, explanation, and written materials on patient's  individual exercise prescription Provide education, explanation, and written materials on patient's individual exercise prescription Provide education, explanation, and written materials on patient's individual exercise prescription     Expected Outcomes Short Term: Able to explain program exercise prescription;Long Term: Able to explain home exercise prescription to exercise independently Short Term: Able to explain program exercise prescription;Long Term: Able to explain home exercise prescription to exercise independently Short Term: Able to explain program exercise prescription;Long Term: Able to explain home exercise prescription to exercise independently              Exercise Goals Re-Evaluation :  Exercise Goals Re-Evaluation     Row Name 11/23/21 0751 12/18/21 1157           Exercise Goal Re-Evaluation   Exercise Goals Review Increase Physical Activity;Increase Strength and Stamina;Able to understand and use rate of perceived exertion (RPE) scale;Able to understand and use Dyspnea scale;Knowledge and understanding of Target Heart Rate Range (THRR);Understanding of Exercise Prescription Increase Physical Activity;Increase Strength and Stamina;Able to understand and use rate of perceived exertion (RPE) scale;Able to understand and use Dyspnea scale;Knowledge and understanding of Target Heart Rate Range (THRR);Understanding of Exercise Prescription      Comments Jill Harper is scheduled to begin exercise this week. Will continue to monitor and progress as able. Jill Harper has completed 6 exercise sessions. She has missed a few sessions due to illness. Jakylah exercises for 15 min on the Nustep and arm ergometer. She averages 1.9 METs at level 2 on the Nustep and 2.0 METs at level 2 on the arm ergometer. She has increased her workload for both exercise modes although METs have remained the same. I have discussed METs and how to increase METs with Jill Harper. She is very motivated to exercise, but her arthritis  limits her. She performs the warmup and cooldown mostly standing. She does an alternative exercise for squats. Will continue to monitor and progress as able.      Expected Outcomes Through exercise at rehab and home, the patient will decrease shortness of breath with daily activities and feel confident in carrying out an exercise regimen at home. Through exercise at rehab and home, the patient will decrease shortness of breath with daily activities and feel confident in carrying out an exercise regimen at home.               Discharge Exercise Prescription (Final Exercise Prescription Changes):  Exercise  Prescription Changes - 12/26/21 1000       Home Exercise Plan   Plans to continue exercise at Home (comment)    Frequency --   Meets minimum requirement   Initial Home Exercises Provided 12/26/21             Nutrition:  Target Goals: Understanding of nutrition guidelines, daily intake of sodium <1553m, cholesterol <2075m calories 30% from fat and 7% or less from saturated fats, daily to have 5 or more servings of fruits and vegetables.  Biometrics:  Pre Biometrics - 11/15/21 1054       Pre Biometrics   Grip Strength 8 kg              Nutrition Therapy Plan and Nutrition Goals:  Nutrition Therapy & Goals - 12/26/21 1115       Nutrition Therapy   Diet Heart Healthy Diet/Mediterranean Diet      Personal Nutrition Goals   Nutrition Goal Patient to identify strategies to achieve weight loss of 0.5-2.0# per week.   In progress. Discussed strategies for weight loss including tracking intake, measuring portions, high fiber intake. Patient extremely motivated.   Personal Goal #2 Patient to describe the benefit of including fruits, vegetables, whole grains, low fat dairy, lean protein/plant protein as part of a heart healthy meal plan.   In progress.   Comments Discussed the Mediterranean Diet as a strategy for weight loss, inflammation support, etc. She reports some  motivation to reduce pork, Diet Coke, and other potentially inflammatory food items.      Intervention Plan   Intervention Prescribe, educate and counsel regarding individualized specific dietary modifications aiming towards targeted core components such as weight, hypertension, lipid management, diabetes, heart failure and other comorbidities.;Nutrition handout(s) given to patient.    Expected Outcomes Short Term Goal: Understand basic principles of dietary content, such as calories, fat, sodium, cholesterol and nutrients.;Short Term Goal: A plan has been developed with personal nutrition goals set during dietitian appointment.;Long Term Goal: Adherence to prescribed nutrition plan.             Nutrition Assessments:  Nutrition Assessments - 11/30/21 1251       Rate Your Plate Scores   Pre Score 72            MEDIFICTS Score Key: ?70 Need to make dietary changes  40-70 Heart Healthy Diet ? 40 Therapeutic Level Cholesterol Diet  Flowsheet Row PUL[facility]ARY REHAB OTHER RESPIR[facility] [place] [facility] [place] on [place] 72      Picture Your Plate Scores: <4<13nhealthy dietary pattern with much room for improvement. 41-50 Dietary pattern unlikely to meet recommendations for good health and room for improvement. 51-60 More healthful dietary pattern, with some room for improvement.  >60 Healthy dietary pattern, although there may be some specific behaviors that could be improved.    Nutrition Goals Re-Evaluation:   Nutrition Goals Discharge (Final Nutrition Goals Re-Evaluation):   Psychosocial: Target Goals: Acknowledge presence or absence of significant depression and/or stress, maximize coping skills, provide positive support system. Participant is able to verbalize types and ability to use techniques and skills needed for reducing stress and depression.  Initial Review & Psychosocial Screening:  Initial  Psych Review & Screening - 11/15/21 1108       Initial Review   Current issues with Current Anxiety/Panic;Current Depression;None Identified   currently seeing counselor     [employer]  Yes    Comments husband, daughter, siblings, friends, counselor      Barriers   Psychosocial barriers to participate in program There are no identifiable barriers or psychosocial needs.      Screening Interventions   Interventions Encouraged to exercise             Quality of Life Scores:  Scores of 19 and below usually indicate a poorer quality of life in these areas.  A difference of  2-3 points is a clinically meaningful difference.  A difference of 2-3 points in the total score of the Quality of Life Index has been associated with significant improvement in overall quality of life, self-image, physical symptoms, and general health in studies assessing change in quality of life.  PHQ-9: Review Flowsheet        11/15/2021 10/21/2020 10/20/2019 10/13/2018 10/09/2018  Depression screen PHQ 2/9  Decreased Interest 1 0 0 0 3  Down, Depressed, Hopeless 1 0 0 0 3  PHQ - 2 Score 2 0 0 0 6  Altered sleeping 2 0  3 3  Tired, decreased energy 2 0  3 3  Change in appetite 0 0  1 1  Feeling bad or failure about yourself  1 0  0 3  Trouble concentrating 1 0  0 0  Moving slowly or fidgety/restless 2 0  3 3  Suicidal thoughts 0 0  0 0  PHQ-9 Score 10 0  10 19  Difficult doing work/chores Somewhat difficult   Somewhat difficult Not difficult at all         Interpretation of Total Score  Total Score Depression Severity:  1-4 = Minimal depression, 5-9 = Mild depression, 10-14 = Moderate depression, 15-19 = Moderately severe depression, 20-27 = Severe depression   Psychosocial Evaluation and Intervention:  Psychosocial Evaluation - 11/15/21 1109       Psychosocial Evaluation & Interventions   Interventions Encouraged to exercise with the program and follow exercise  prescription    Expected Outcomes For Fizza to participate in Pulmonary Rehab    Continue Psychosocial Services  Follow up required by staff             Psychosocial Re-Evaluation:  Psychosocial Re-Evaluation     [place] Name 12/26/21 1761             Psychosocial Re-Evaluation   Current issues with Current Anxiety/Panic;Current Depression;None Identified       Comments Jill Harper has been attending the PR program without any psychosocial barriers. She is on medication foor her anxiety and depression and also has a counselor that she sees routinlely. The issues that she has  been is with pain from her RA and having to be off of her RA medication. She has been very motivated to exercise and pushes herself to even through the pain. She seems to be enjoying the classes and making connections with her classmates.       Expected Outcomes For her to continue to participate in the PR program without any psychosocial issues or concerns.       Interventions Encouraged to attend Pulmonary Rehabilitation for the exercise                Psychosocial Discharge (Final Psychosocial Re-Evaluation):  Psychosocial Re-Evaluation - 12/26/21 0923       Psychosocial Re-Evaluation   Current issues with Current Anxiety/Panic;Current Depression;None Identified    Comments Jill Harper has been attending the PR program without any psychosocial barriers. She is on medication foor  her anxiety and depression and also has a Social worker that she sees routinlely. The issues that she has  been is with pain from her RA and having to be off of her RA medication. She has been very motivated to exercise and pushes herself to even through the pain. She seems to be enjoying the classes and making connections with her classmates.    Expected Outcomes For her to continue to participate in the PR program without any psychosocial issues or concerns.    Interventions Encouraged to attend Pulmonary Rehabilitation for the exercise              Education: Education Goals: Education classes will be provided on a weekly basis, covering required topics. Participant will state understanding/return demonstration of topics presented.  Learning Barriers/Preferences:  Learning Barriers/Preferences - 11/15/21 1111       Learning Barriers/Preferences   Learning Barriers Sight   readers and contact lens   Learning Preferences Written Material;Group Instruction;Individual Instruction             Education Topics: Risk Factor Reduction:  -Group instruction that is supported by a PowerPoint presentation. Instructor discusses the definition of a risk factor, different risk factors for pulmonary disease, and how the heart and lungs work together.     Nutrition for Pulmonary Patient:  -Group instruction provided by PowerPoint slides, verbal discussion, and written materials to support subject matter. The instructor gives an explanation and review of healthy diet recommendations, which includes a discussion on weight management, recommendations for fruit and vegetable consumption, as well as protein, fluid, caffeine, fiber, sodium, sugar, and alcohol. Tips for eating when patients are short of breath are discussed. Flowsheet Row PULMONARY REHAB OTHER RESPIRATORY from 12/21/2021 in [place]  Date 12/21/21  Educator Jill Harper  [METS Handout]       Pursed Lip Breathing:  -Group instruction that is supported by demonstration and informational handouts. Instructor discusses the benefits of pursed lip and diaphragmatic breathing and detailed demonstration on how to preform both.     Oxygen Safety:  -Group instruction provided by PowerPoint, verbal discussion, and written material to support subject matter. There is an overview of "What is Oxygen" and "Why do we need it".  Instructor also reviews how to create a safe environment for oxygen use, the importance of using oxygen as prescribed, and the risks  of noncompliance. There is a brief discussion on traveling with oxygen and resources the patient may utilize.   Oxygen Equipment:  -Group instruction provided by [facility] Staff utilizing handouts, written materials, and equipment demonstrations.   Signs and Symptoms:  -Group instruction provided by written material and verbal discussion to support subject matter. Warning signs and symptoms of infection, stroke, and heart attack are reviewed and when to call the physician/911 reinforced. Tips for preventing the spread of infection discussed.   Advanced Directives:  -Group instruction provided by verbal instruction and written material to support subject matter. Instructor reviews Advanced Directive laws and proper instruction for filling out document.   Pulmonary Video:  -Group video education that reviews the importance of medication and oxygen compliance, exercise, good nutrition, pulmonary hygiene, and pursed lip and diaphragmatic breathing for the pulmonary patient.   Exercise for the Pulmonary Patient:  -Group instruction that is supported by a PowerPoint presentation. Instructor discusses benefits of exercise, core components of exercise, frequency, duration, and intensity of an exercise routine, importance of utilizing pulse oximetry during exercise, safety while exercising, and options of places  to exercise outside of rehab.   Flowsheet Row PULMONARY REHAB OTHER RESPIRATORY from 12/21/2021 in [place]  Date 12/14/21  Educator Jill Harper  Instruction Review Code 1- Verbalizes Understanding       Pulmonary Medications:  -Verbally interactive group education provided by instructor with focus on inhaled medications and proper administration.   Anatomy and Physiology of the Respiratory System and Intimacy:  -Group instruction provided by PowerPoint, verbal discussion, and written material to support subject matter. Instructor reviews respiratory cycle  and anatomical components of the respiratory system and their functions. Instructor also reviews differences in obstructive and restrictive respiratory diseases with examples of each. Intimacy, Sex, and Sexuality differences are reviewed with a discussion on how relationships can change when diagnosed with pulmonary disease. Common sexual concerns are reviewed.   MD DAY -A group question and answer session with a medical doctor that allows participants to ask questions that relate to their pulmonary disease state.   OTHER EDUCATION -Group or individual verbal, written, or video instructions that support the educational goals of the pulmonary rehab program. [facility] from 12/07/2021 in [place]  Date 11/30/21  Educator Jill Harper your numbers]       Holiday Eating Survival Tips:  -Group instruction provided by PowerPoint slides, verbal discussion, and written materials to support subject matter. The instructor gives patients tips, tricks, and techniques to help them not only survive but enjoy the holidays despite the onslaught of food that accompanies the holidays.   Knowledge Questionnaire Score:  Knowledge Questionnaire Score - 11/15/21 1122       Knowledge Questionnaire Score   Pre Score 17/18             Core Components/Risk Factors/Patient Goals at Admission:  Personal Goals and Risk Factors at Admission - 11/15/21 1111       Core Components/Risk Factors/Patient Goals on Admission    Weight Management Yes;Weight Loss    Improve shortness of breath with ADL's Yes    Intervention Provide education, individualized exercise plan and daily activity instruction to help decrease symptoms of SOB with activities of daily living.    Expected Outcomes Short Term: Improve cardiorespiratory fitness to achieve a reduction of symptoms when performing ADLs;Long Term: Be able to perform more ADLs without symptoms or  delay the onset of symptoms             Core Components/Risk Factors/Patient Goals Review:   Goals and Risk Factor Review     Row Name 12/26/21 0937             Core Components/Risk Factors/Patient Goals Review   Personal Goals Review Weight Management/Obesity;Improve shortness of breath with ADL's;Increase knowledge of respiratory medications and ability to use respiratory devices properly.;Develop more efficient breathing techniques such as purse lipped breathing and diaphragmatic breathing and practicing self-pacing with activity.       Review Jill Harper has been making progress with her workloads and MET levels on the nustep and arm egometer. Her progress has been slow due the her pain issues. Her oxygen saturations have been96-97% on room air and she reports her SOB has been mld with difficulty to moderate difficulty.Her weight has  remained stable no gain or loss. although she has been working with the [facility] on making better choices with her diet. She reports that having to be on the steroids always effects her appetite and gives her cravings.  Expected Outcomes See admission goals.                Core Components/Risk Factors/Patient Goals at Discharge (Final Review):   Goals and Risk Factor Review - 12/26/21 0937       Core Components/Risk Factors/Patient Goals Review   Personal Goals Review Weight Management/Obesity;Improve shortness of breath with ADL's;Increase knowledge of respiratory medications and ability to use respiratory devices properly.;Develop more efficient breathing techniques such as purse lipped breathing and diaphragmatic breathing and practicing self-pacing with activity.    Review Jill Harper has been making progress with her workloads and MET levels on the nustep and arm egometer. Her progress has been slow due the her pain issues. Her oxygen saturations have been96-97% on room air and she reports her SOB has been mld with difficulty to moderate difficulty.Her  weight has  remained stable no gain or loss. although she has been working with the [facility] on making better choices with her diet. She reports that having to be on the steroids always effects her appetite and gives her cravings.    Expected Outcomes See admission goals.             ITP Comments: Pt is making expected progress toward Pulmonary Rehab goals after completing 9 sessions. Recommend continued exercise, life style modification, education, and utilization of breathing techniques to increase stamina and strength, while also decreasing shortness of breath with exertion.  Dr. Rodman Pickle is Medical Director for Pulmonary Rehab at [facility].

## 2021-12-27 NOTE — Assessment & Plan Note (Signed)
Chronic Improved on the higher dose of Cymbalta Continue 60 mg of Cymbalta daily Discussed that we can increase this in the future if needed She still has anxiety and discussed that she will likely have intermittent increased anxiety for a while given her situation

## 2021-12-27 NOTE — Assessment & Plan Note (Signed)
Chronic Associated with soreness and brain fog Now exercising via pulm rehab which may help Hopefully Cymbalta will help as well

## 2021-12-27 NOTE — Assessment & Plan Note (Addendum)
Chronic Blood pressure well controlled Continue telmisartan 20 mg daily

## 2021-12-28 ENCOUNTER — Encounter (HOSPITAL_COMMUNITY)
Admission: RE | Admit: 2021-12-28 | Discharge: 2021-12-28 | Disposition: A | Discharge status: Home / Self Care | Payer: Federal, State, Local not specified - PPO | Source: Ambulatory Visit | Attending: Pulmonary Disease | Admitting: Pulmonary Disease

## 2021-12-28 DIAGNOSIS — J849 Interstitial pulmonary disease, unspecified: Secondary | ICD-10-CM | POA: Diagnosis not present

## 2021-12-28 NOTE — Progress Notes (Signed)
Daily Session Note  Patient Details  Name: Jill Harper MRN: [id number] Date of Birth: [birth year] Referring Provider:   April Manson Pulmonary Rehab Walk Test from 11/15/2021 in [place]  Referring Provider Sood       Encounter Date: 12/28/2021  Check In:  Session Check In - 12/28/21 1136       Check-In   Supervising physician immediately available to respond to emergencies [facility] immediately available    Physician(s) Dr. Manuella Ghazi    Location [facility]    Staff Present Rosebud Poles, RN, BSN;Lisa Ysidro Evert, Cathleen Fears, MS, ACSM-CEP, Exercise Physiologist    Virtual Visit No    Medication changes reported     No    Fall or balance concerns reported    No    Tobacco Cessation No Change    Warm-up and Cool-down Performed as group-led instruction    Resistance Training Performed Yes    VAD Patient? No    PAD/SET Patient? No      Pain Assessment   Currently in Pain? No/denies             Capillary Blood Glucose: No results found for this or any previous visit (from the past 24 hour(s)).    Social History   Tobacco Use  Smoking Status Never   Passive exposure: Never  Smokeless Tobacco Never  Tobacco Comments   Married, lives with spouse. 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    Dr. Rodman Pickle is Medical Director for Pulmonary Rehab at [facility].

## 2022-01-02 ENCOUNTER — Encounter (HOSPITAL_COMMUNITY)
Admission: RE | Admit: 2022-01-02 | Discharge: 2022-01-02 | Disposition: A | Discharge status: Home / Self Care | Payer: Federal, State, Local not specified - PPO | Source: Ambulatory Visit | Attending: Pulmonary Disease | Admitting: Pulmonary Disease

## 2022-01-02 VITALS — Wt 293.7 lb

## 2022-01-02 DIAGNOSIS — J849 Interstitial pulmonary disease, unspecified: Secondary | ICD-10-CM

## 2022-01-02 NOTE — Progress Notes (Signed)
Daily Session Note  Patient Details  Name: Jill Harper MRN: [id number] Date of Birth: 1966/01/20 Referring Provider:   April Manson Pulmonary Rehab Walk Test from 11/15/2021 in [place]  Referring Provider Sood       Encounter Date: 01/02/2022  Check In:  Session Check In - 01/02/22 1130       Check-In   Supervising physician immediately available to respond to emergencies [facility] immediately available    Physician(s) Dr. Tawanna Solo    Location [facility]    Staff Present Rosebud Poles, RN, BSN;Margree Gimbel Ysidro Evert, RN;Olinty Celesta Aver, MS, ACSM CEP, Exercise Physiologist;Kaylee Rosana Hoes, MS, ACSM-CEP, Exercise Physiologist    Virtual Visit No    Medication changes reported     No    Fall or balance concerns reported    No    Tobacco Cessation No Change    Warm-up and Cool-down Performed as group-led instruction    Resistance Training Performed Yes    VAD Patient? No    PAD/SET Patient? No      Pain Assessment   Currently in Pain? No/denies             Capillary Blood Glucose: No results found for this or any previous visit (from the past 24 hour(s)).   Exercise Prescription Changes - 01/02/22 1100       Response to Exercise   Blood Pressure (Admit) 128/72    Blood Pressure (Exercise) 138/82    Blood Pressure (Exit) 118/78    Heart Rate (Admit) 99 bpm    Heart Rate (Exercise) 112 bpm    Heart Rate (Exit) 89 bpm    Oxygen Saturation (Admit) 98 %    Oxygen Saturation (Exercise) 98 %    Oxygen Saturation (Exit) 97 %    Rating of Perceived Exertion (Exercise) 13    Perceived Dyspnea (Exercise) 3    Duration Continue with 30 min of aerobic exercise without signs/symptoms of physical distress.    Intensity THRR unchanged      Progression   Progression Continue to progress workloads to maintain intensity without signs/symptoms of physical distress.      Resistance Training   Reps 10-15    Time 10 Minutes       NuStep   Level 3    SPM 80    Minutes 15    METs 1.9      Arm Ergometer   Level 3    Minutes 15    METs 1.9             Social History   Tobacco Use  Smoking Status Never   Passive exposure: Never  Smokeless Tobacco Never  Tobacco Comments   Married, lives with spouse. 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 Rodman Pickle is Medical Director for Pulmonary Rehab at [facility].

## 2022-01-03 DIAGNOSIS — F410 Panic disorder [episodic paroxysmal anxiety] without agoraphobia: Secondary | ICD-10-CM | POA: Diagnosis not present

## 2022-01-04 ENCOUNTER — Encounter (HOSPITAL_COMMUNITY): Payer: Federal, State, Local not specified - PPO

## 2022-01-05 ENCOUNTER — Telehealth: Payer: Self-pay | Admitting: Internal Medicine

## 2022-01-05 NOTE — Telephone Encounter (Signed)
Pt called to check on status of PW, please call her back to inform her of status.

## 2022-01-05 NOTE — Telephone Encounter (Signed)
Spoke with patient today. 

## 2022-01-09 ENCOUNTER — Encounter (HOSPITAL_COMMUNITY)
Admission: RE | Admit: 2022-01-09 | Discharge: 2022-01-09 | Disposition: A | Discharge status: Home / Self Care | Payer: Federal, State, Local not specified - PPO | Source: Ambulatory Visit | Attending: Pulmonary Disease | Admitting: Pulmonary Disease

## 2022-01-09 DIAGNOSIS — J849 Interstitial pulmonary disease, unspecified: Secondary | ICD-10-CM | POA: Insufficient documentation

## 2022-01-09 DIAGNOSIS — I10 Essential (primary) hypertension: Secondary | ICD-10-CM | POA: Diagnosis not present

## 2022-01-09 NOTE — Progress Notes (Signed)
Daily Session Note  Patient Details  Name: Jill Harper MRN: [id number] Date of Birth: 06/05/1966 Referring Provider:   Flowsheet Row Pulmonary Rehab Walk Test from 11/15/2021 in [facility]  Referring Provider Sood       Encounter Date: 01/09/2022  Check In:  Session Check In - 01/09/22 1153       Check-In   Supervising physician immediately available to respond to emergencies [facility] immediately available    Physician(s) Dr. Akula    Location [facility]    Staff Present Joan Behrens, RN, BSN; , MS, ACSM-CEP, Exercise Physiologist;Lisa Hughes, RN    Virtual Visit No    Medication changes reported     No    Fall or balance concerns reported    No    Tobacco Cessation No Change    Warm-up and Cool-down Performed as group-led instruction    Resistance Training Performed Yes    PAD/SET Patient? No      Pain Assessment   Currently in Pain? No/denies             Capillary Blood Glucose: No results found for this or any previous visit (from the past 24 hour(s)).    Social History   Tobacco Use  Smoking Status Never   Passive exposure: Never  Smokeless Tobacco Never  Tobacco Comments   Married, lives with spouse. 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    Dr. Jane Ellison is Medical Director for Pulmonary Rehab at [facility].  

## 2022-01-10 NOTE — Telephone Encounter (Signed)
PT visits today to receive forms. She had to leave the forms with new instructions for [name]. Forms and instructions have been left in an envelope to Dr.Burns and Carla. I left this envelope in Dr.Burns mailbox!  CB: [phone]

## 2022-01-11 ENCOUNTER — Encounter (HOSPITAL_COMMUNITY)
Admission: RE | Admit: 2022-01-11 | Discharge: 2022-01-11 | Disposition: A | Discharge status: Home / Self Care | Payer: Federal, State, Local not specified - PPO | Source: Ambulatory Visit | Attending: Pulmonary Disease | Admitting: Pulmonary Disease

## 2022-01-11 DIAGNOSIS — J849 Interstitial pulmonary disease, unspecified: Secondary | ICD-10-CM | POA: Diagnosis not present

## 2022-01-11 DIAGNOSIS — I10 Essential (primary) hypertension: Secondary | ICD-10-CM | POA: Diagnosis not present

## 2022-01-11 NOTE — Telephone Encounter (Signed)
Spoke with patient today. 

## 2022-01-11 NOTE — Progress Notes (Signed)
Daily Session Note  Patient Details  Name: Jill Harper MRN: [id number] Date of Birth: January 12, 1966 Referring Provider:   April Manson Pulmonary Rehab Walk Test from 11/15/2021 in [place]  Referring Provider Sood       Encounter Date: 01/11/2022  Check In:  Session Check In - 01/11/22 1126       Check-In   Supervising physician immediately available to respond to emergencies [facility] immediately available    Physician(s) Dr. Cyndia Skeeters    Location [facility]    Staff Present Rosebud Poles, RN, BSN;Dontavion Noxon Ysidro Evert, Cathleen Fears, MS, ACSM-CEP, Exercise Physiologist    Virtual Visit No    Medication changes reported     No    Fall or balance concerns reported    No    Tobacco Cessation No Change    Warm-up and Cool-down Performed as group-led instruction    Resistance Training Performed Yes    VAD Patient? No    PAD/SET Patient? No      Pain Assessment   Currently in Pain? No/denies    Multiple Pain Sites No             Capillary Blood Glucose: No results found for this or any previous visit (from the past 24 hour(s)).    Social History   Tobacco Use  Smoking Status Never   Passive exposure: Never  Smokeless Tobacco Never  Tobacco Comments   Married, lives with spouse. 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 Rodman Pickle is Medical Director for Pulmonary Rehab at [facility]

## 2022-01-12 ENCOUNTER — Telehealth: Payer: Self-pay

## 2022-01-12 NOTE — Telephone Encounter (Signed)
Call to pt reference PREP referral Explained program to patient.  Interested but doesn't feel she can start now, having flares from her autoimmune diagnosis  Would like mid morning at [facility] when she is feeling better ? July? Advised coach from [place] will call her Requested I send materials Engineer, building services) to [email or web address] Message sent to coach

## 2022-01-15 ENCOUNTER — Ambulatory Visit (INDEPENDENT_AMBULATORY_CARE_PROVIDER_SITE_OTHER): Payer: Federal, State, Local not specified - PPO

## 2022-01-15 ENCOUNTER — Encounter: Payer: Self-pay | Admitting: Nurse Practitioner

## 2022-01-15 ENCOUNTER — Ambulatory Visit: Payer: Federal, State, Local not specified - PPO | Admitting: Nurse Practitioner

## 2022-01-15 VITALS — BP 122/64 | HR 88 | Temp 98.4°F | Ht 68.0 in | Wt 294.0 lb

## 2022-01-15 DIAGNOSIS — G4734 Idiopathic sleep related nonobstructive alveolar hypoventilation: Secondary | ICD-10-CM

## 2022-01-15 DIAGNOSIS — J471 Bronchiectasis with (acute) exacerbation: Secondary | ICD-10-CM | POA: Diagnosis not present

## 2022-01-15 DIAGNOSIS — J849 Interstitial pulmonary disease, unspecified: Secondary | ICD-10-CM | POA: Diagnosis not present

## 2022-01-15 DIAGNOSIS — R059 Cough, unspecified: Secondary | ICD-10-CM | POA: Diagnosis not present

## 2022-01-15 MED ORDER — ALBUTEROL SULFATE HFA 108 (90 BASE) MCG/ACT IN AERS: 2.0000 | INHALATION_SPRAY | Freq: Four times a day (QID) | RESPIRATORY_TRACT | 4 refills | Status: DC | PRN

## 2022-01-15 MED ORDER — SODIUM CHLORIDE 3 % IN NEBU: INHALATION_SOLUTION | RESPIRATORY_TRACT | 12 refills | Status: DC | PRN

## 2022-01-15 MED ORDER — PREDNISONE 10 MG PO TABS: ORAL_TABLET | ORAL | 0 refills | Status: DC

## 2022-01-15 MED ORDER — BENZONATATE 200 MG PO CAPS: 200.0000 mg | ORAL_CAPSULE | Freq: Three times a day (TID) | ORAL | 1 refills | Status: DC | PRN

## 2022-01-15 NOTE — Patient Instructions (Addendum)
Continue Albuterol inhaler 2 puffs or 3 mL neb every 6 hours as needed for shortness of breath or wheezing. Notify if symptoms persist despite rescue inhaler/neb use.  Continue Trelegy 1 puff daily. Brush tongue and rinse mouth afterwards Continue mucinex 1200 mg Twice daily for chest congestion/cough Continue supplemental oxygen bled through CPAP every night   Hypertonic saline nebs Twice daily as needed for cough/congestion Prednisone taper. 4 tabs for 2 days, then 3 tabs for 2 days, 2 tabs for 2 days, then 1 tab for 2 days, then stop. Take in AM with food.  Delsym 2 tsp Twice daily for cough Chlorpheniramine 4 mg tab over the counter At bedtime for cough Tessalon perles 1 capsule Three times a day as needed for cough  Chest x ray today    Follow up in 2 weeks with [name] or Katie Virginie Josten,NP. If symptoms do not improve or worsen, please contact office for sooner follow up or seek emergency care.

## 2022-01-15 NOTE — Progress Notes (Signed)
$'@Patient'a$  ID: Jill Harper, female    DOB: August 28, 1965, 56 y.o.   MRN: [id number]  Chief Complaint  Patient presents with   Acute Visit    Pt states she has been having complaints of fatigue and also has been coughing for the past 5 days. States that she is not coughing up any phlegm. States that the cough is worse at night and in the morning.    Referring provider: Binnie Rail, MD  HPI: 56 year old female, never smoker followed for ILD in the setting of seropositive RA and polymyositis, bronchiectasis, OSA on CPAP and nocturnal hypoxemia. She is a patient of Dr. Juanetta Gosling and last seen in office on 10/11/2021. Past medical history significant for migraines, MR, fibromuscular dysplasia, HTN, anxiety.  She is followed by rheumatology and is on Imuran and Enbrel.  TEST/EVENTS:  07/28/2019 PFTs: FVC 78, FEV1 88, ratio 89, TLC 71, DLCOcor 80.  Mild to moderate restrictive defect  10/11/2021: OV with [name]. Breathing improved after starting on Trelegy. Still not entirely back to her baseline after having respiratory infection in November 2022. Minimal cough with clear phlegm. Using CPAP 7-15 cmH2O and 2.5 lpm O2 at night without difficulty. Ordered HRCT and 6MWT to further assess. Referred back to pulmonary rehab.   01/15/2022: Today-acute Patient presents today for acute visit.  She has been having an increased and more congested cough over the last 5 days or so.  She had been at pulmonary rehab and they recommended that she come in to get checked out.  She also has felt a little more fatigued than normal and feels like her activity tolerance is not as good as it normally is.  Cough is worse at night and first thing in the morning.  Currently nonproductive.  Mild increase in shortness of breath.  She has not noticed any wheezing.  She denies any URI symptoms, fevers, night sweats, lower extremity edema, hemoptysis.  She continues on Trelegy daily and has used her albuterol a couple times.  No concerns  or complaints regarding CPAP.  Allergies  Allergen Reactions   Sulfonamide Derivatives     REACTION: hives    Immunization History  Administered Date(s) Administered   Influenza Split 04/10/2011, 06/18/2012, 05/06/2013   Influenza Whole 05/19/2008, 05/15/2009, 06/01/2010   Influenza,inj,Quad PF,6+ Mos 04/20/2014, 05/26/2015, 05/31/2016, 04/05/2017, 06/16/2018, 04/22/2019, 04/22/2020   Influenza-Unspecified 04/11/2021   PFIZER(Purple Top)SARS-COV-2 Vaccination 10/27/2019, 11/19/2019   Pneumococcal Conjugate-13 01/29/2017   Pneumococcal Polysaccharide-23 08/07/2007, 06/16/2018   Td 07/12/2009   Tdap 01/06/2020   Unspecified SARS-COV-2 Vaccination 10/27/2019, 11/19/2019, 04/04/2021    Past Medical History:  Diagnosis Date   Abnormal liver function test    Bronchiectasis        Dyspnea    Fibromuscular dysplasia (HCC)    Fibromyalgia    Heart murmur    ILD (interstitial lung disease) ([place])    Joint pain    Menorrhagia    MIGRAINE HEADACHE    MVP (mitral valve prolapse)    Osteoarthritis    Polymyositis (HCC)    Dr [name]; chronic MTX   Pulmonary fibrosis (HCC)    Rheumatoid arthritis ([place])    Sleep apnea    Vitamin B 12 deficiency    Vitamin D deficiency     Tobacco History: Social History   Tobacco Use  Smoking Status Never   Passive exposure: Never  Smokeless Tobacco Never  Tobacco Comments   Married, lives with spouse. 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 120 vial 5   aspirin EC 81 MG tablet Take 81 mg by mouth daily.      atenolol (TENORMIN) 25 MG tablet TAKE 1/2 TABLET BY MOUTH DAILY AS NEEDED FOR MITRAL PROLAPSE 45 tablet 0   azaTHIOprine (IMURAN) 50 MG tablet Take 2 tablets by mouth daily. 60 tablet 2   Cholecalciferol (VITAMIN D) 50 MCG (2000 UT) CAPS Take by mouth.     diclofenac Sodium (VOLTAREN) 1 % GEL APPLY 2 TO 4 GRAMS TO AFFECTED JOINT FOUR TIMES DAILY AS NEEDED 400 g 2   DULoxetine (CYMBALTA) 60 MG capsule Take 1 capsule (60 mg total) by mouth daily. 90 capsule 1   eletriptan (RELPAX) 20 MG tablet TAKE 1 TABLET BY MOUTH AS NEEDED FOR MIGRAINE 10 tablet 11   EMGALITY 120 MG/ML SOAJ ADMINISTER 1 ML UNDER THE SKIN EVERY 30 DAYS 1 mL 5   ENBREL MINI 50 MG/ML SOCT INSERT 1 MINI CARTRIDGE INTO AUTOINJECTOR AND INJECT 50 MG UNDER THE SKIN EVERY 7 DAYS. 12 mL 0   Fluticasone-Umeclidin-Vilant (TRELEGY ELLIPTA) 100-62.5-25 MCG/ACT AEPB Inhale 1 puff into the lungs daily. 60 each 5   gabapentin (NEURONTIN) 100 MG capsule Take 3 capsules (300 mg total) by mouth at bedtime. 90 capsule 5   guaiFENesin (MUCINEX) 600 MG 12 hr tablet Take 2 tablets (1,200 mg total) by mouth 2 (two) times daily as needed for cough or to loosen phlegm. (Patient taking differently: Take 1,200 mg by mouth 2 (two) times daily.)     metFORMIN (GLUCOPHAGE) 500 MG tablet TAKE 1 TABLET(500 MG) BY MOUTH TWICE DAILY WITH A MEAL 180 tablet 1   Multiple Vitamins-Minerals (MULTIVITAMIN GUMMIES ADULT PO) Take 2 each by mouth daily.     Rimegepant Sulfate (NURTEC) 75 MG TBDP Take 75 mg by mouth daily as needed. For migraines. Take as close to onset of migraine as possible. One daily maximum. 10 tablet 6   telmisartan (MICARDIS) 20 MG tablet TAKE 1 TABLET(20 MG) BY MOUTH DAILY 90 tablet 1   albuterol (PROVENTIL HFA) 108 (90 Base) MCG/ACT inhaler Inhale 2 puffs into the lungs 4 (four) times daily as needed. Reported on 09/03/2015 1 Inhaler 4   No facility-administered medications prior to visit.     Review of Systems:   Constitutional: No weight loss  or gain, night sweats, fevers, chills +fatigue, lassitude. HEENT: No headaches, difficulty swallowing, tooth/dental problems, or sore throat. No sneezing, itching, ear ache, nasal congestion, or post nasal drip CV:  No chest pain, orthopnea, PND, swelling in lower extremities, anasarca, dizziness, palpitations, syncope Resp: +shortness of breath with exertion (increased); congested, non-productive cough. No excess mucus or change in color of mucus. No hemoptysis. No wheezing.  No chest wall deformity GI:  No heartburn, indigestion, abdominal pain, nausea, vomiting, diarrhea, change in bowel habits, loss of appetite, bloody stools.  Skin: No rash, lesions, ulcerations MSK:  No joint pain or swelling.  No decreased range of motion.  No back pain. Neuro: No dizziness or lightheadedness.  Psych: No depression or anxiety. Mood stable.     Physical Exam:  BP 122/64 (BP Location: Right Wrist, Patient Position: Sitting, Cuff Size: Normal)   Pulse 88   Temp 98.4 F (36.9 C) (Oral)   Ht '5\' 8"'$  (1.727 m)   Wt 294 lb (133.4 kg)   SpO2 97% Comment: RA  BMI 44.70 kg/m   GEN: Pleasant, interactive, well-appearing; obese; in no acute distress. HEENT:  Normocephalic and atraumatic. PERRLA. Sclera white. Nasal turbinates pink, moist and patent bilaterally. No rhinorrhea present. Oropharynx pink and moist, without exudate or edema. No lesions, ulcerations, or postnasal drip.  NECK:  Supple w/ fair ROM. No JVD present. Normal carotid impulses w/o bruits. Thyroid symmetrical with no goiter or nodules palpated. No lymphadenopathy.   CV: RRR, no m/r/g, no peripheral edema. Pulses intact, +2 bilaterally. No cyanosis, pallor or clubbing. PULMONARY:  Unlabored, regular breathing. Minimal scattered rhonchi bilaterally A&P. Congested cough. No accessory muscle use. No dullness to percussion. GI: BS present and normoactive. Soft, non-tender to palpation. No organomegaly or masses detected. No CVA tenderness. MSK: No  erythema, warmth or tenderness. Cap refil <2 sec all extrem. No deformities or joint swelling noted.  Neuro: A/Ox3. No focal deficits noted.   Skin: Warm, no lesions or rashe Psych: Normal affect and behavior. Judgement and thought content appropriate.     Lab Results:  CBC    Component Value Date/Time   WBC 9.8 11/02/2021 1511   RBC 4.75 11/02/2021 1511   HGB 13.4 11/02/2021 1511   HGB 14.2 10/12/2019 1432   HCT 40.6 11/02/2021 1511   HCT 41.9 10/12/2019 1432   PLT 273 11/02/2021 1511   PLT 292 10/12/2019 1432   MCV 85.5 11/02/2021 1511   MCV 86 10/12/2019 1432   MCH 28.2 11/02/2021 1511   MCHC 33.0 11/02/2021 1511   RDW 12.3 11/02/2021 1511   RDW 12.1 10/12/2019 1432   LYMPHSABS 1,303 11/02/2021 1511   LYMPHSABS 1.3 10/12/2019 1432   MONOABS 0.7 07/11/2021 0813   EOSABS 147 11/02/2021 1511   EOSABS 0.1 10/12/2019 1432   BASOSABS 39 11/02/2021 1511   BASOSABS 0.0 10/12/2019 1432    BMET    Component Value Date/Time   NA 143 11/02/2021 1511   NA 141 10/12/2019 1432   K 4.2 11/02/2021 1511   CL 107 11/02/2021 1511   CO2 28 11/02/2021 1511   GLUCOSE 100 (H) 11/02/2021 1511   BUN 12 11/02/2021 1511   BUN 11 10/12/2019 1432   CREATININE 0.76 11/02/2021 1511   CALCIUM 9.6 11/02/2021 1511   GFRNONAA 98 01/18/2021 1626   GFRAA 114 01/18/2021 1626    BNP No results found for: "BNP"   Imaging:  DG Chest 2 View  Result Date: 01/15/2022 CLINICAL DATA:  Increased cough EXAM: CHEST - 2 VIEW COMPARISON:  08/21/2021, CT 10/23/2021 FINDINGS: Mild diffuse chronic interstitial opacity. No acute superimposed airspace disease, pleural effusion or pneumothorax. Normal cardiomediastinal silhouette. IMPRESSION: Chronic lung disease without superimposed acute airspace disease Electronically Signed   By: Donavan Foil M.D.   On: 01/15/2022 17:19         Latest Ref Rng & Units 07/27/2021    3:59 PM 02/17/2020    4:03 PM 02/05/2019    3:59 PM 04/17/2018    2:54 PM 12/02/2015    11:56 AM  PFT Results  FVC-Pre L 2.55  2.42  2.58  2.74  3.40   FVC-Predicted Pre % 78  73  78  90  99   FVC-Post L 2.69  2.42  2.56   3.24  FVC-Predicted Post % 82  73  77   94   Pre FEV1/FVC % % 90  86  89  90  87   Post FEV1/FCV % % 89  90  86   92   FEV1-Pre L 2.29  2.09  2.30  2.46  2.97   FEV1-Predicted Pre % 88  80  87  101  108   FEV1-Post L 2.41  2.18  2.21   2.96   DLCO uncorrected ml/min/mmHg 18.89  18.58  19.43  20.26  19.92   DLCO UNC% % 80  78  81  76  65   DLCO corrected ml/min/mmHg 18.89  18.42  19.68  20.01  19.46   DLCO COR %Predicted % 80  77  82  75  64   DLVA Predicted % 120  125  120  98  79   TLC L 4.04  4.22  4.21   4.95   TLC % Predicted % 71  74  74   86   RV % Predicted % 66  74  69   76     Lab Results  Component Value Date   NITRICOXIDE 9 12/11/2017        Assessment & Plan:   Bronchiectasis with (acute) exacerbation (HCC) Bronchiectatic flare with persistent cough. COVID negative. Will treat with prednisone taper. No evidence of superimposed infection on CXR and cough is non-productive so will hold off on empiric abx. Hypertonic saline nebs added to mucociliary clearance regimen. Cough control for paroxysmal spells. Close follow up  Patient Instructions  Continue Albuterol inhaler 2 puffs or 3 mL neb every 6 hours as needed for shortness of breath or wheezing. Notify if symptoms persist despite rescue inhaler/neb use.  Continue Trelegy 1 puff daily. Brush tongue and rinse mouth afterwards Continue mucinex 1200 mg Twice daily for chest congestion/cough Continue supplemental oxygen bled through CPAP every night   Hypertonic saline nebs Twice daily as needed for cough/congestion Prednisone taper. 4 tabs for 2 days, then 3 tabs for 2 days, 2 tabs for 2 days, then 1 tab for 2 days, then stop. Take in AM with food.  Delsym 2 tsp Twice daily for cough Chlorpheniramine 4 mg tab over the counter At bedtime for cough Tessalon perles 1 capsule Three  times a day as needed for cough  Chest x ray today    Follow up in 2 weeks with [name] or Katie Vallarie Fei,NP. If symptoms do not improve or worsen, please contact office for sooner follow up or seek emergency care.    ILD (interstitial lung disease) ([place]) HRCT from March was stable without evidence of progression; questioned UIP vs NSIP. See above plan. Return to pulmonary rehab once cough has improved.   Nocturnal hypoxia Clinically stable. Continue 2.5 lpm bled through CPAP at night. No O2 requirement during the day currently. Sats stable in office today on room air. Goal >88-90%   I spent 35 minutes of dedicated to the care of this patient on the date of this encounter to include pre-visit review of records, face-to-face time with the patient discussing conditions above, post visit ordering of testing, clinical documentation with the electronic health record, making appropriate referrals as documented, and communicating necessary findings to members of the patients care team.  Clayton Bibles, NP 01/16/2022  Pt aware and understands NP's role.

## 2022-01-16 ENCOUNTER — Encounter: Payer: Self-pay | Admitting: Nurse Practitioner

## 2022-01-16 ENCOUNTER — Telehealth: Payer: Self-pay | Admitting: Rheumatology

## 2022-01-16 ENCOUNTER — Encounter (HOSPITAL_COMMUNITY)
Admission: RE | Admit: 2022-01-16 | Discharge: 2022-01-16 | Disposition: A | Discharge status: Home / Self Care | Payer: Federal, State, Local not specified - PPO | Source: Ambulatory Visit | Attending: Pulmonary Disease | Admitting: Pulmonary Disease

## 2022-01-16 VITALS — Wt 293.2 lb

## 2022-01-16 DIAGNOSIS — G4734 Idiopathic sleep related nonobstructive alveolar hypoventilation: Secondary | ICD-10-CM | POA: Insufficient documentation

## 2022-01-16 DIAGNOSIS — I10 Essential (primary) hypertension: Secondary | ICD-10-CM | POA: Diagnosis not present

## 2022-01-16 DIAGNOSIS — J849 Interstitial pulmonary disease, unspecified: Secondary | ICD-10-CM

## 2022-01-16 NOTE — Telephone Encounter (Signed)
Patient advised she would not need to hold her Enbrel or Imuran while on the prednisone. Patient expressed understanding.

## 2022-01-16 NOTE — Assessment & Plan Note (Addendum)
Clinically stable. Continue 2.5 lpm bled through CPAP at night. No O2 requirement during the day currently. Sats stable in office today on room air. Goal >88-90% 

## 2022-01-16 NOTE — Progress Notes (Signed)
Reviewed and agree with assessment/plan.   Chesley Mires, MD [facility] Pulmonary/Critical Care 01/16/2022, 9:50 AM Pager:  [phone]

## 2022-01-16 NOTE — Telephone Encounter (Signed)
Patient called the office stating her pulmonologist put her on prednisone due to a flare. Patient states they wanted her to notify Dr. Ronnald Collum she needs to stop any medications. Patient states she started the prednisone taper today and will be on it for approx. 8-10 days.

## 2022-01-16 NOTE — Assessment & Plan Note (Addendum)
Bronchiectatic flare with persistent cough. COVID negative. Will treat with prednisone taper. No evidence of superimposed infection on CXR and cough is non-productive so will hold off on empiric abx. Hypertonic saline nebs added to mucociliary clearance regimen. Cough control for paroxysmal spells. Close follow up  Patient Instructions  Continue Albuterol inhaler 2 puffs or 3 mL neb every 6 hours as needed for shortness of breath or wheezing. Notify if symptoms persist despite rescue inhaler/neb use.  Continue Trelegy 1 puff daily. Brush tongue and rinse mouth afterwards Continue mucinex 1200 mg Twice daily for chest congestion/cough Continue supplemental oxygen bled through CPAP every night   Hypertonic saline nebs Twice daily as needed for cough/congestion Prednisone taper. 4 tabs for 2 days, then 3 tabs for 2 days, 2 tabs for 2 days, then 1 tab for 2 days, then stop. Take in AM with food.  Delsym 2 tsp Twice daily for cough Chlorpheniramine 4 mg tab over the counter At bedtime for cough Tessalon perles 1 capsule Three times a day as needed for cough  Chest x ray today    Follow up in 2 weeks with [name] or Katie Lyrik Buresh,NP. If symptoms do not improve or worsen, please contact office for sooner follow up or seek emergency care.

## 2022-01-16 NOTE — Assessment & Plan Note (Signed)
HRCT from March was stable without evidence of progression; questioned UIP vs NSIP. See above plan. Return to pulmonary rehab once cough has improved.

## 2022-01-16 NOTE — Progress Notes (Signed)
Daily Session Note  Patient Details  Name: Jill Harper MRN: [id number] Date of Birth: March 16, 1966 Referring Provider:   April Manson Pulmonary Rehab Walk Test from 11/15/2021 in [place]  Referring Provider Sood       Encounter Date: 01/16/2022  Check In:  Session Check In - 01/16/22 1117       Check-In   Supervising physician immediately available to respond to emergencies [facility] immediately available    Physician(s) Dr. Grandville Silos    Location [facility]    Staff Present Rosebud Poles, RN, BSN;Makiyah Zentz Ysidro Evert, Cathleen Fears, MS, ACSM-CEP, Exercise Physiologist    Virtual Visit No    Medication changes reported     No    Fall or balance concerns reported    No    Tobacco Cessation No Change    Warm-up and Cool-down Performed as group-led instruction    Resistance Training Performed Yes    VAD Patient? No    PAD/SET Patient? No      Pain Assessment   Currently in Pain? No/denies    Multiple Pain Sites No             Capillary Blood Glucose: No results found for this or any previous visit (from the past 24 hour(s)).   Exercise Prescription Changes - 01/16/22 1100       Response to Exercise   Blood Pressure (Admit) 118/56    Blood Pressure (Exercise) 126/72    Blood Pressure (Exit) 114/62    Heart Rate (Admit) 83 bpm    Heart Rate (Exercise) 90 bpm    Heart Rate (Exit) 83 bpm    Oxygen Saturation (Admit) 98 %    Oxygen Saturation (Exercise) 97 %    Oxygen Saturation (Exit) 99 %    Rating of Perceived Exertion (Exercise) 13    Perceived Dyspnea (Exercise) 3    Duration Continue with 30 min of aerobic exercise without signs/symptoms of physical distress.    Intensity THRR unchanged      Progression   Progression Continue to progress workloads to maintain intensity without signs/symptoms of physical distress.      Resistance Training   Weight Red bands    Reps 10-15    Time 10 Minutes      NuStep    Level 3    SPM 80    Minutes 15    METs 1.7      Arm Ergometer   Level 3    Minutes 15    METs 1.8             Social History   Tobacco Use  Smoking Status Never   Passive exposure: Never  Smokeless Tobacco Never  Tobacco Comments   Married, lives with spouse. 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 Rodman Pickle is Medical Director for Pulmonary Rehab at [facility].

## 2022-01-17 DIAGNOSIS — F410 Panic disorder [episodic paroxysmal anxiety] without agoraphobia: Secondary | ICD-10-CM | POA: Diagnosis not present

## 2022-01-18 ENCOUNTER — Encounter (HOSPITAL_COMMUNITY)
Admission: RE | Admit: 2022-01-18 | Discharge: 2022-01-18 | Disposition: A | Discharge status: Home / Self Care | Payer: Federal, State, Local not specified - PPO | Source: Ambulatory Visit | Attending: Pulmonary Disease | Admitting: Pulmonary Disease

## 2022-01-18 VITALS — Wt 293.2 lb

## 2022-01-18 DIAGNOSIS — I10 Essential (primary) hypertension: Secondary | ICD-10-CM | POA: Diagnosis not present

## 2022-01-18 DIAGNOSIS — J849 Interstitial pulmonary disease, unspecified: Secondary | ICD-10-CM | POA: Diagnosis not present

## 2022-01-18 NOTE — Progress Notes (Signed)
Daily Session Note  Patient Details  Name: Jill Harper MRN: [id number] Date of Birth: July 06, 1966 Referring Provider:   April Manson Pulmonary Rehab Walk Test from 11/15/2021 in [place]  Referring Provider Sood       Encounter Date: 01/18/2022  Check In:  Session Check In - 01/18/22 1122       Check-In   Supervising physician immediately available to respond to emergencies [facility] immediately available    Physician(s) Dr. Cyndia Skeeters    Location [facility]    Staff Present Rodney Langton, RN;Carlette Wilber Oliphant, RN, Quentin Ore, MS, ACSM-CEP, Exercise Physiologist    Virtual Visit No    Medication changes reported     No    Fall or balance concerns reported    No    Tobacco Cessation No Change    Warm-up and Cool-down Performed as group-led instruction    Resistance Training Performed Yes    VAD Patient? No    PAD/SET Patient? No      Pain Assessment   Currently in Pain? No/denies             Capillary Blood Glucose: No results found for this or any previous visit (from the past 24 hour(s)).    Social History   Tobacco Use  Smoking Status Never   Passive exposure: Never  Smokeless Tobacco Never  Tobacco Comments   Married, lives with spouse. 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 Rodman Pickle is Medical Director for Pulmonary Rehab at [facility].

## 2022-01-22 ENCOUNTER — Encounter: Payer: Self-pay | Admitting: Pulmonary Disease

## 2022-01-22 ENCOUNTER — Ambulatory Visit (INDEPENDENT_AMBULATORY_CARE_PROVIDER_SITE_OTHER): Payer: Federal, State, Local not specified - PPO | Admitting: Pulmonary Disease

## 2022-01-22 VITALS — BP 142/64 | HR 123 | Temp 98.0°F | Ht 68.0 in | Wt 293.2 lb

## 2022-01-22 DIAGNOSIS — J471 Bronchiectasis with (acute) exacerbation: Secondary | ICD-10-CM | POA: Diagnosis not present

## 2022-01-22 MED ORDER — LEVOFLOXACIN 500 MG PO TABS: 500.0000 mg | ORAL_TABLET | Freq: Every day | ORAL | 0 refills | Status: DC

## 2022-01-22 NOTE — Progress Notes (Signed)
[name]  Chief Complaint  Patient presents with   Follow-up    Follow up.     Constitutional:  BP (!) 142/64 (BP Location: Right Arm, Patient Position: Sitting, Cuff Size: Large)   Pulse (!) 123   Temp 98 F (36.7 C) (Oral)   Ht _0  (1.727 m)   Wt 293 lb 3.2 oz (133 kg)   SpO2 98%   BMI 44.58 kg/m   Past Medical History:  Fibromyalgia, Migraine HA, MVP, OA, Fibromuscular dysplasia, Erythema nodosum, Vit D deficiency  Past Surgical History:  Her  has a past surgical history that includes Cardiac catheterization (2002); Dilation and curettage of uterus (08-31-08); ablation uterine (2010); IR ANGIO VERTEBRAL SEL VERTEBRAL BILAT MOD SED (09/18/2018); IR ANGIO INTRA EXTRACRAN SEL COM CAROTID INNOMINATE BILAT MOD SED (09/18/2018); and IR US Guide Vasc Access Right (09/18/2018).  Brief Summary:  Jill Harper is a 56 y.o. female with bronchiectasis in the setting of seropositive rheumatoid arthritis and polymyositis.      Subjective:   She saw [name] earlier this month.  She was having increased cough and congestion.  She was treated with prednisone taper.  CXR from 01/15/22 showed chronic changes w/o acute findings.  She didn't get the usual boost when she was on prednisone.  She has cough that is more raspy and rattling in her chest.  She is bringing up more phlegm and it has a green color.  This isn't her usual bronchiectasis cough.  She wasn't able to get hypertonic saline - told it wasn't available at present.  Physical Exam:   Appearance - well kempt   ENMT - no sinus tenderness, no oral exudate, no LAN, Mallampati 2 airway, no stridor  Respiratory - equal breath sounds bilaterally, no wheezing or rales  CV - s1s2 regular rate and rhythm, no murmurs  Ext - no clubbing, no edema  Skin - no rashes  Psych - normal mood and affect      Pulmonary testing:  PFT 10/30/11 >> FEV1 3.31 (114%), FEV1% 85, TLC 6.06 (108%), DLCO  77%, no BD PFT 11/18/12 >> FEV1 3.18 (111%), FEV1% 84, TLC 5.34 (95%), DLCO 67%, no BD PFT 12/02/15 >> FEV1 2.96 (108%), FEV1% 92, TLC 4.95 (86%), DLCO 65%, no BD PFT 04/17/18 >> FEV1 2.46 (101%), FEV1% 90, DLCO 76% PFT 02/05/19 >> FEV1 2.30 (87%), FEV1% 89, TLC 4.21 (74%), DLCO 81%, no BD PFT 02/17/20 >> FEV1 2.18 (83%), FEV1% 90, TLC 4.22 (74%), DLCO 78% PFT 07/27/21 >> FEV1 2.41 (93%), FEV1% 89, TLC 4.04 (71%), DLCO 80%  Chest Imaging:  CT chest 03/23/05 >> patchy b/l lower lung ASD with cylindrical BTX CT chest 02/17/08 >> peripheral and basilar predominant subpleural GGO CT chest 12/01/08 >> no change HRCT chest 09/07/15 >> scattered GGO HRCT chest 04/15/18 >> patchy confluent subpleural and peripheral peribronchovascular reticulation and ground-glass opacity throughout both lungs with a strong basilar gradient, traction BTX HRCT chest 03/02/20 >> widespread patchy GGO, septal thickening, mild cylindrical BTX, craniocaudal gradient with mild progression, mild air trapping HRCT chest 10/25/21 >> patchy GGO, septal thickening, small amount of subpleural reticulation, mild cylindrical BTX without signifcant change (fibrotic NSIP versus probable UIP)  Labs:  Quantiferon gold 01/31/17 >> negative Serology 01/31/17 >> RF 29, CCP 213, Jo-1 > 8; ANA, SCL 70, SSA/SSB, ENA Sm negative Quantiferon gold 01/31/17 >> negative Ig 01/31/17 >> IgG 1992, IgA 147, IgM 136  Sleep Tests:  HST 07/28/18 >> AHI  5, SpO2 low 83% ONO with CPAP 11/05/18 >> test time 6 hrs 24 min, baseline SpO2 90%, low SpO2 80%; spent 1 hr 15 min with SpO2 < 88% Auto CPAP 09/11/21 to 10/10/21 >> used on 30 of 30 nights with average 12 hrs.  Average AHI 3.4 with median CPAP 9 and 95 th percentile CPAP 12 cm H2O  Cardiac Tests:  Echo 11/22/20 >> EF 65 to 70%, mild LVH  Social History:  She  reports that she has never smoked. She has never been exposed to tobacco smoke. She has never used smokeless tobacco. She reports that she does not currently  use alcohol. She reports that she does not use drugs.  Family History:  Her family history includes Diabetes in her brother, mother, sister, and sister; Fibromyalgia in her mother; Headache in an other family member; Heart attack in her sister; Heart disease in her brother; Heart failure in her mother; Hypertension in her father, sister, and sister; Lupus in her sister; Migraines in her sister; Multiple myeloma in her father and sister; Obesity in her mother; Other in her sister; Rheum arthritis in her sister and sister; Stroke in her father; Thyroid disease in her mother; Ulcers in her mother.      Assessment/Plan:   ILD with UIP pattern and Bronchiectasis in setting of RA and polymyositis. - has slow to resolve exacerbation - don't think she needs additional prednisone - will give her a course of levaquin 500 mg daily for 7 days - will have her get sputum for gram stain and culture - continue trelegy - prn albuterol, flutter valve, mucinex  Rheumatoid arthritis with positive RF and CCP, polymyositis. - followed by Dr. Bo Merino with rheumatology - maintained on imuran and enbrel   Obstructive sleep apnea. - she is compliant with CPAP and reports benefit from therapy - she uses [employer] for her DME - her current CPAP was ordered on 08/20/18 - continue auto CPAP 7 to 15 cm H2O   Nocturnal hypoxemia. - 2.5 liters oxygen at night with CPAP and prn during the day with dyspnea and headache  Migraine headache. - followed by Dr. Sarina Ill with [facility]  Time Spent Involved in Patient Care on Day of Examination:  38 minutes  Follow up:   Patient Instructions  Levaquin 500 mg daily for 7 days  Will arrange for sputum gram stain and culture  Follow up with [name] on 01/29/22 as previously scheduled  Medication List:   Allergies as of 01/22/2022       Reactions   Sulfonamide Derivatives    REACTION: hives        Medication List        Accurate as  of January 22, 2022  5:13 PM. If you have any questions, ask your nurse or doctor.          STOP taking these medications    predniSONE 10 MG tablet Commonly known as: DELTASONE Stopped by: Chesley Mires, MD       TAKE these medications    albuterol (2.5 MG/3ML) 0.083% nebulizer solution Commonly known as: PROVENTIL Take 3 mLs (2.5 mg total) by nebulization every 6 (six) hours as needed for wheezing or shortness of breath.   albuterol 108 (90 Base) MCG/ACT inhaler Commonly known as: Proventil HFA Inhale 2 puffs into the lungs 4 (four) times daily as needed. Reported on 09/03/2015   aspirin EC 81 MG tablet Take 81 mg by mouth daily.   atenolol 25 MG  tablet Commonly known as: TENORMIN TAKE 1/2 TABLET BY MOUTH DAILY AS NEEDED FOR MITRAL PROLAPSE   azaTHIOprine 50 MG tablet Commonly known as: IMURAN Take 2 tablets by mouth daily.   benzonatate 200 MG capsule Commonly known as: TESSALON Take 1 capsule (200 mg total) by mouth 3 (three) times daily as needed.   diclofenac Sodium 1 % Gel Commonly known as: VOLTAREN APPLY 2 TO 4 GRAMS TO AFFECTED JOINT FOUR TIMES DAILY AS NEEDED   DULoxetine 60 MG capsule Commonly known as: Cymbalta Take 1 capsule (60 mg total) by mouth daily.   eletriptan 20 MG tablet Commonly known as: RELPAX TAKE 1 TABLET BY MOUTH AS NEEDED FOR MIGRAINE   Emgality 120 MG/ML Soaj Generic drug: Galcanezumab-gnlm ADMINISTER 1 ML UNDER THE SKIN EVERY 30 DAYS   Enbrel Mini 50 MG/ML Soct Generic drug: Etanercept INSERT 1 MINI CARTRIDGE INTO AUTOINJECTOR AND INJECT 50 MG UNDER THE SKIN EVERY 7 DAYS.   gabapentin 100 MG capsule Commonly known as: NEURONTIN Take 3 capsules (300 mg total) by mouth at bedtime.   guaiFENesin 600 MG 12 hr tablet Commonly known as: Mucinex Take 2 tablets (1,200 mg total) by mouth 2 (two) times daily as needed for cough or to loosen phlegm. What changed: when to take this   levofloxacin 500 MG tablet Commonly known as:  LEVAQUIN Take 1 tablet (500 mg total) by mouth daily. Started by: Chesley Mires, MD   metFORMIN 500 MG tablet Commonly known as: GLUCOPHAGE TAKE 1 TABLET(500 MG) BY MOUTH TWICE DAILY WITH A MEAL   MULTIVITAMIN GUMMIES ADULT PO Take 2 each by mouth daily.   Nurtec 75 MG Tbdp Generic drug: Rimegepant Sulfate Take 75 mg by mouth daily as needed. For migraines. Take as close to onset of migraine as possible. One daily maximum.   sodium chloride HYPERTONIC 3 % nebulizer solution Take by nebulization as needed for cough.   telmisartan 20 MG tablet Commonly known as: MICARDIS TAKE 1 TABLET(20 MG) BY MOUTH DAILY   Trelegy Ellipta 100-62.5-25 MCG/ACT Aepb Generic drug: Fluticasone-Umeclidin-Vilant Inhale 1 puff into the lungs daily.   Vitamin D 50 MCG (2000 UT) Caps Take by mouth.        Signature:  Chesley Mires, MD [facility] Pager - [phone] 01/22/2022, 5:13 PM

## 2022-01-22 NOTE — Patient Instructions (Signed)
Levaquin 500 mg daily for 7 days  Will arrange for sputum gram stain and culture  Follow up with Jill Harper on 01/29/22 as previously scheduled

## 2022-01-23 ENCOUNTER — Other Ambulatory Visit: Payer: Federal, State, Local not specified - PPO

## 2022-01-23 ENCOUNTER — Telehealth (HOSPITAL_COMMUNITY): Payer: Self-pay | Admitting: Internal Medicine

## 2022-01-23 ENCOUNTER — Encounter (HOSPITAL_COMMUNITY): Payer: Federal, State, Local not specified - PPO

## 2022-01-23 DIAGNOSIS — J471 Bronchiectasis with (acute) exacerbation: Secondary | ICD-10-CM | POA: Diagnosis not present

## 2022-01-24 ENCOUNTER — Telehealth: Payer: Self-pay | Admitting: Rheumatology

## 2022-01-24 DIAGNOSIS — F410 Panic disorder [episodic paroxysmal anxiety] without agoraphobia: Secondary | ICD-10-CM | POA: Diagnosis not present

## 2022-01-24 LAB — RESPIRATORY CULTURE OR RESPIRATORY AND SPUTUM CULTURE: MICRO NUMBER:: 13547770

## 2022-01-24 NOTE — Telephone Encounter (Signed)
Patient advised she should hold her Imuran and Enbrel until she has completed the antibiotics and the infections has cleared.

## 2022-01-24 NOTE — Progress Notes (Signed)
Pulmonary Individual Treatment Plan  Patient Details  Name: Jill Harper MRN: [id number] Date of Birth: [birth year] Referring Provider:   April Manson Pulmonary Rehab Walk Test from 11/15/2021 in [place]  Referring Provider Sood       Initial Encounter Date:  Flowsheet Row Pulmonary Rehab Walk Test from 11/15/2021 in [place]  Date 11/15/21       Visit Diagnosis: ILD (interstitial lung disease) ([place])  Hypertension, unspecified type  Patient's Home Medications on Admission:   Current Outpatient Medications:    albuterol (PROVENTIL HFA) 108 (90 Base) MCG/ACT inhaler, Inhale 2 puffs into the lungs 4 (four) times daily as needed. Reported on 09/03/2015, Disp: 1 each, Rfl: 4   albuterol (PROVENTIL) (2.5 MG/3ML) 0.083% nebulizer solution, Take 3 mLs (2.5 mg total) by nebulization every 6 (six) hours as needed for wheezing or shortness of breath., Disp: 120 vial, Rfl: 5   aspirin EC 81 MG tablet, Take 81 mg by mouth daily., Disp: , Rfl:    atenolol (TENORMIN) 25 MG tablet, TAKE 1/2 TABLET BY MOUTH DAILY AS NEEDED FOR MITRAL PROLAPSE, Disp: 45 tablet, Rfl: 0   azaTHIOprine (IMURAN) 50 MG tablet, Take 2 tablets by mouth daily., Disp: 60 tablet, Rfl: 2   benzonatate (TESSALON) 200 MG capsule, Take 1 capsule (200 mg total) by mouth 3 (three) times daily as needed., Disp: 30 capsule, Rfl: 1   Cholecalciferol (VITAMIN D) 50 MCG (2000 UT) CAPS, Take by mouth., Disp: , Rfl:    diclofenac Sodium (VOLTAREN) 1 % GEL, APPLY 2 TO 4 GRAMS TO AFFECTED JOINT FOUR TIMES DAILY AS NEEDED, Disp: 400 g, Rfl: 2   DULoxetine (CYMBALTA) 60 MG capsule, Take 1 capsule (60 mg total) by mouth daily., Disp: 90 capsule, Rfl: 1   eletriptan (RELPAX) 20 MG tablet, TAKE 1 TABLET BY MOUTH AS NEEDED FOR MIGRAINE, Disp: 10 tablet, Rfl: 11   EMGALITY 120 MG/ML SOAJ, ADMINISTER 1 ML UNDER THE SKIN EVERY 30 DAYS, Disp: 1 mL, Rfl: 5   ENBREL MINI 50 MG/ML SOCT,  INSERT 1 MINI CARTRIDGE INTO AUTOINJECTOR AND INJECT 50 MG UNDER THE SKIN EVERY 7 DAYS., Disp: 12 mL, Rfl: 0   Fluticasone-Umeclidin-Vilant (TRELEGY ELLIPTA) 100-62.5-25 MCG/ACT AEPB, Inhale 1 puff into the lungs daily., Disp: 60 each, Rfl: 5   gabapentin (NEURONTIN) 100 MG capsule, Take 3 capsules (300 mg total) by mouth at bedtime., Disp: 90 capsule, Rfl: 5   guaiFENesin (MUCINEX) 600 MG 12 hr tablet, Take 2 tablets (1,200 mg total) by mouth 2 (two) times daily as needed for cough or to loosen phlegm. (Patient taking differently: Take 1,200 mg by mouth 2 (two) times daily.), Disp: , Rfl:    levofloxacin (LEVAQUIN) 500 MG tablet, Take 1 tablet (500 mg total) by mouth daily., Disp: 7 tablet, Rfl: 0   metFORMIN (GLUCOPHAGE) 500 MG tablet, TAKE 1 TABLET(500 MG) BY MOUTH TWICE DAILY WITH A MEAL, Disp: 180 tablet, Rfl: 1   Multiple Vitamins-Minerals (MULTIVITAMIN GUMMIES ADULT PO), Take 2 each by mouth daily., Disp: , Rfl:    Rimegepant Sulfate (NURTEC) 75 MG TBDP, Take 75 mg by mouth daily as needed. For migraines. Take as close to onset of migraine as possible. One daily maximum., Disp: 10 tablet, Rfl: 6   sodium chloride HYPERTONIC 3 % nebulizer solution, Take by nebulization as needed for cough., Disp: 75 mL, Rfl: 12   telmisartan (MICARDIS) 20 MG tablet, TAKE 1 TABLET(20 MG) BY MOUTH DAILY, Disp:  90 tablet, Rfl: 1  Past Medical History: Past Medical History:  Diagnosis Date   Abnormal liver function test    Bronchiectasis        Dyspnea    Fibromuscular dysplasia (HCC)    Fibromyalgia    Heart murmur    ILD (interstitial lung disease) (HCC)    Joint pain    Menorrhagia    MIGRAINE HEADACHE    MVP (mitral valve prolapse)    Osteoarthritis    Polymyositis (HCC)    Dr [name]; chronic MTX   Pulmonary fibrosis (HCC)    Rheumatoid arthritis ([place])    Sleep apnea    Vitamin B 12 deficiency    Vitamin D deficiency     Tobacco Use: Social History   Tobacco Use  Smoking Status  Never   Passive exposure: Never  Smokeless Tobacco Never  Tobacco Comments   Married, lives with spouse. 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    Labs: Review Flowsheet  More data exists      Latest Ref Rng & Units 08/28/2012 04/24/2016 10/09/2018 10/12/2019  Labs for ITP Cardiac and Pulmonary Rehab  Cholestrol 0 - 200 mg/dL 141  110  141  154   LDL (calc) 0 - 99 mg/dL 74  59  74  83   HDL-C >39.00 mg/dL 57.10  39.20  44  61   Trlycerides 0.0 - 149.0 mg/dL 51.0  59.0  116  43   Hemoglobin A1c 4.6 - 6.5 % - 4.8  5.5  5.5       07/11/2021  Labs for ITP Cardiac and Pulmonary Rehab  Cholestrol 129   LDL (calc) 74   HDL-C 39.60   Trlycerides 78.0   Hemoglobin A1c 5.9     Capillary Blood Glucose: Lab Results  Component Value Date   GLUCAP 95 11/30/2021   GLUCAP 101 (H) 11/30/2021   GLUCAP 109 (H) 11/28/2021   GLUCAP 124 (H) 11/28/2021     Pulmonary Assessment Scores:  Pulmonary Assessment Scores     Row Name 11/15/21 1122 01/02/22 1522       ADL UCSD   ADL Phase Entry Exit    SOB Score total 70 63      CAT Score   CAT Score 28 21      mMRC Score   mMRC Score 4 --            UCSD: Self-administered rating of dyspnea associated with activities of daily living (ADLs) 6-point scale (0 = "not at all" to 5 = "maximal or unable to do because of breathlessness")  Scoring Scores range from 0 to 120.  Minimally important difference is 5 units  CAT: CAT can identify the health impairment of COPD patients and is better correlated with disease progression.  CAT has a scoring range of zero to 40. The CAT score is classified into four groups of low (less than 10), medium (10 - 20), high (21-30) and very high (31-40) based on the impact level of disease on health status. A CAT score over 10 suggests significant symptoms.  A worsening CAT score could be explained by an exacerbation, poor medication adherence, poor inhaler technique, or progression  of COPD or comorbid conditions.  CAT MCID is 2 points  mMRC: mMRC (Modified Medical Research Council) Dyspnea Scale is used to assess the degree of baseline functional disability in patients of respiratory disease due to dyspnea. No minimal important difference is established. A  decrease in score of 1 point or greater is considered a positive change.   Pulmonary Function Assessment:  Pulmonary Function Assessment - 11/15/21 1131       Breath   Bilateral Breath Sounds Clear    Shortness of Breath Yes;Fear of Shortness of Breath;Limiting activity;Panic with Shortness of Breath             Exercise Target Goals: Exercise Program Goal: Individual exercise prescription set using results from initial 6 min walk test and THRR while considering  patient's activity barriers and safety.   Exercise Prescription Goal: Initial exercise prescription builds to 30-45 minutes a day of aerobic activity, 2-3 days per week.  Home exercise guidelines will be given to patient during program as part of exercise prescription that the participant will acknowledge.  Activity Barriers & Risk Stratification:  Activity Barriers & Cardiac Risk Stratification - 11/15/21 1112       Activity Barriers & Cardiac Risk Stratification   Activity Barriers Arthritis;Shortness of Breath;Fibromyalgia;Joint Problems   RA   Cardiac Risk Stratification Low             6 Minute Walk:  6 Minute Walk     Row Name 11/15/21 1257         6 Minute Walk   Phase Initial     Distance 1230 feet     Walk Time 6 minutes     # of Rest Breaks 0     MPH 2.33     METS 3.19     RPE 17     Perceived Dyspnea  3     VO2 Peak 11.15     Symptoms No     Resting HR 80 bpm     Resting BP 116/64     Resting Oxygen Saturation  97 %     Exercise Oxygen Saturation  during 6 min walk 95 %     Max Ex. HR 144 bpm     Max Ex. BP 148/70     2 Minute Post BP 130/70       Interval HR   1 Minute HR 112     2 Minute HR 110     3  Minute HR 115     4 Minute HR 133     5 Minute HR 141     6 Minute HR 144     2 Minute Post HR 103     Interval Heart Rate? Yes       Interval Oxygen   Interval Oxygen? Yes     Baseline Oxygen Saturation % 97 %     1 Minute Oxygen Saturation % 96 %     1 Minute Liters of Oxygen 0 L     2 Minute Oxygen Saturation % 95 %     2 Minute Liters of Oxygen 0 L     3 Minute Oxygen Saturation % 97 %     3 Minute Liters of Oxygen 0 L     4 Minute Oxygen Saturation % 97 %     4 Minute Liters of Oxygen 0 L     5 Minute Oxygen Saturation % 95 %     5 Minute Liters of Oxygen 0 L     6 Minute Oxygen Saturation % 96 %     6 Minute Liters of Oxygen 0 L     2 Minute Post Oxygen Saturation % 98 %     2 Minute Post Liters of Oxygen  0 L              Oxygen Initial Assessment:  Oxygen Initial Assessment - 11/15/21 1115       Home Oxygen   Home Oxygen Device None    Sleep Oxygen Prescription CPAP    Liters per minute 3    Home Exercise Oxygen Prescription None    Home Resting Oxygen Prescription None    Compliance with Home Oxygen Use Yes      Initial 6 min Walk   Oxygen Used None      Program Oxygen Prescription   Program Oxygen Prescription None      Intervention   Short Term Goals To learn and exhibit compliance with exercise, home and travel O2 prescription;To learn and understand importance of monitoring SPO2 with pulse oximeter and demonstrate accurate use of the pulse oximeter.;To learn and understand importance of maintaining oxygen saturations>88%;To learn and demonstrate proper pursed lip breathing techniques or other breathing techniques. ;To learn and demonstrate proper use of respiratory medications    Long  Term Goals Exhibits compliance with exercise, home  and travel O2 prescription;Verbalizes importance of monitoring SPO2 with pulse oximeter and return demonstration;Maintenance of O2 saturations>88%;Exhibits proper breathing techniques, such as pursed lip breathing or  other method taught during program session;Compliance with respiratory medication;Demonstrates proper use of MDI's             Oxygen Re-Evaluation:  Oxygen Re-Evaluation     Row Name 11/23/21 0751 12/18/21 1200 01/16/22 0846         Program Oxygen Prescription   Program Oxygen Prescription None None None       Home Oxygen   Home Oxygen Device None None None     Sleep Oxygen Prescription CPAP CPAP CPAP     Liters per minute _0 Home Exercise Oxygen Prescription None None None     Home Resting Oxygen Prescription None None None     Compliance with Home Oxygen Use Yes Yes Yes       Goals/Expected Outcomes   Short Term Goals To learn and exhibit compliance with exercise, home and travel O2 prescription;To learn and understand importance of monitoring SPO2 with pulse oximeter and demonstrate accurate use of the pulse oximeter.;To learn and understand importance of maintaining oxygen saturations>88%;To learn and demonstrate proper pursed lip breathing techniques or other breathing techniques. ;To learn and demonstrate proper use of respiratory medications To learn and exhibit compliance with exercise, home and travel O2 prescription;To learn and understand importance of monitoring SPO2 with pulse oximeter and demonstrate accurate use of the pulse oximeter.;To learn and understand importance of maintaining oxygen saturations>88%;To learn and demonstrate proper pursed lip breathing techniques or other breathing techniques. ;To learn and demonstrate proper use of respiratory medications To learn and exhibit compliance with exercise, home and travel O2 prescription;To learn and understand importance of monitoring SPO2 with pulse oximeter and demonstrate accurate use of the pulse oximeter.;To learn and understand importance of maintaining oxygen saturations>88%;To learn and demonstrate proper pursed lip breathing techniques or other breathing techniques. ;To learn and demonstrate proper use of  respiratory medications     Long  Term Goals Exhibits compliance with exercise, home  and travel O2 prescription;Verbalizes importance of monitoring SPO2 with pulse oximeter and return demonstration;Maintenance of O2 saturations>88%;Exhibits proper breathing techniques, such as pursed lip breathing or other method taught during program session;Compliance with respiratory medication;Demonstrates proper use of MDI's Exhibits compliance with exercise, home  and travel O2  prescription;Verbalizes importance of monitoring SPO2 with pulse oximeter and return demonstration;Maintenance of O2 saturations>88%;Exhibits proper breathing techniques, such as pursed lip breathing or other method taught during program session;Compliance with respiratory medication;Demonstrates proper use of MDI's Exhibits compliance with exercise, home  and travel O2 prescription;Verbalizes importance of monitoring SPO2 with pulse oximeter and return demonstration;Maintenance of O2 saturations>88%;Exhibits proper breathing techniques, such as pursed lip breathing or other method taught during program session;Compliance with respiratory medication;Demonstrates proper use of MDI's     Goals/Expected Outcomes Compliance and understanding of oxygen saturation monitoring and breathing techniques to decrease shortness of breath. Compliance and understanding of oxygen saturation monitoring and breathing techniques to decrease shortness of breath. Compliance and understanding of oxygen saturation monitoring and breathing techniques to decrease shortness of breath.              Oxygen Discharge (Final Oxygen Re-Evaluation):  Oxygen Re-Evaluation - 01/16/22 0846       Program Oxygen Prescription   Program Oxygen Prescription None      Home Oxygen   Home Oxygen Device None    Sleep Oxygen Prescription CPAP    Liters per minute 3    Home Exercise Oxygen Prescription None    Home Resting Oxygen Prescription None    Compliance with Home  Oxygen Use Yes      Goals/Expected Outcomes   Short Term Goals To learn and exhibit compliance with exercise, home and travel O2 prescription;To learn and understand importance of monitoring SPO2 with pulse oximeter and demonstrate accurate use of the pulse oximeter.;To learn and understand importance of maintaining oxygen saturations>88%;To learn and demonstrate proper pursed lip breathing techniques or other breathing techniques. ;To learn and demonstrate proper use of respiratory medications    Long  Term Goals Exhibits compliance with exercise, home  and travel O2 prescription;Verbalizes importance of monitoring SPO2 with pulse oximeter and return demonstration;Maintenance of O2 saturations>88%;Exhibits proper breathing techniques, such as pursed lip breathing or other method taught during program session;Compliance with respiratory medication;Demonstrates proper use of MDI's    Goals/Expected Outcomes Compliance and understanding of oxygen saturation monitoring and breathing techniques to decrease shortness of breath.             Initial Exercise Prescription:  Initial Exercise Prescription - 11/15/21 1200       Date of Initial Exercise RX and Referring Provider   Date 11/15/21    Referring Provider Sood    Expected Discharge Date 01/18/22      NuStep   Level 1    SPM 80    Minutes 15    METs 1.5      Arm Ergometer   Level 1    Watts 2    Minutes 15      Prescription Details   Frequency (times per week) 2    Duration Progress to 30 minutes of continuous aerobic without signs/symptoms of physical distress      Intensity   THRR 40-80% of Max Heartrate 66-132    Ratings of Perceived Exertion 11-13    Perceived Dyspnea 0-4      Progression   Progression Continue to progress workloads to maintain intensity without signs/symptoms of physical distress.      Resistance Training   Training Prescription Yes    Weight yellow bands    Reps 10-15             Perform  Capillary Blood Glucose checks as needed.  Exercise Prescription Changes:   Exercise Prescription Changes  [place] Name 12/05/21 1200 12/19/21 1211 12/26/21 1000 01/02/22 1100 01/16/22 1100     Response to Exercise   Blood Pressure (Admit) 120/68 104/70 -- 128/72 118/56   Blood Pressure (Exercise) 140/78 120/70 -- 138/82 126/72   Blood Pressure (Exit) 112/70 124/70 -- 118/78 114/62   Heart Rate (Admit) 105 bpm 95 bpm -- 99 bpm 83 bpm   Heart Rate (Exercise) 120 bpm 116 bpm -- 112 bpm 90 bpm   Heart Rate (Exit) 95 bpm 93 bpm -- 89 bpm 83 bpm   Oxygen Saturation (Admit) 96 % 98 % -- 98 % 98 %   Oxygen Saturation (Exercise) 97 % 96 % -- 98 % 97 %   Oxygen Saturation (Exit) 98 % 98 % -- 97 % 99 %   Rating of Perceived Exertion (Exercise) 12 13 -- 13 13   Perceived Dyspnea (Exercise) 2 2 -- 3 3   Duration Progress to 30 minutes of  aerobic without signs/symptoms of physical distress Progress to 30 minutes of  aerobic without signs/symptoms of physical distress -- Continue with 30 min of aerobic exercise without signs/symptoms of physical distress. Continue with 30 min of aerobic exercise without signs/symptoms of physical distress.   Intensity THRR unchanged  40-80% HRR THRR unchanged  40-80% HRR -- THRR unchanged THRR unchanged     Progression   Progression -- -- -- Continue to progress workloads to maintain intensity without signs/symptoms of physical distress. Continue to progress workloads to maintain intensity without signs/symptoms of physical distress.     Resistance Training   Training Prescription Yes Yes -- -- --   Weight Red bands Red bands -- Red bands Red bands   Reps 10-15 10-15 -- 10-15 10-15   Time 10 Minutes 10 Minutes -- 10 Minutes 10 Minutes     NuStep   Level 2 2 -- 3 3   SPM 80 80 -- 80 80   Minutes 15 15 -- 15 15   METs 1.9 2.1 -- 1.9 1.7     Arm Ergometer   Level 1.5 2 -- 3 3   Minutes 15 15 -- 15 15   METs 1.6 2 -- 1.9 1.8     Home Exercise Plan   Plans  to continue exercise at -- -- Home (comment) -- --   Frequency -- -- --  Meets minimum requirement -- --   Initial Home Exercises Provided -- -- 12/26/21 -- --            Exercise Comments:   Exercise Comments     Row Name 11/28/21 1207 12/26/21 1038         Exercise Comments Ronasia completed her first day of exercise. She exercised for 15 min on the Nustep and arm ergometer. Rhian averaged 1.8 METs at level 1 on the Nustep and 1.6 MET at level 1 on the arm ergometer. Pasty performed the warmup and cooldown mostly standing. She did the seated alternative for squats. Rosealee is very motivated to exercise and improve her functional capacity. Completed home exercise plan. Louella is currently exercising at home. She walks 3 non-rehab days/wk for 30 min/day. She also mentioned that she has a treadmill and recumbent bike at home. She will walk around with her husband when the weather is nice. Finnleigh is very motivated to exercise. I am confident in her carrying out and exercise regimen at home, and she has all the resources she needs to exercise at home.  Exercise Goals and Review:   Exercise Goals     Row Name 11/15/21 1112 11/23/21 0751 12/18/21 1156 01/16/22 0847       Exercise Goals   Increase Physical Activity Yes Yes Yes Yes    Intervention Provide advice, education, support and counseling about physical activity/exercise needs.;Develop an individualized exercise prescription for aerobic and resistive training based on initial evaluation findings, risk stratification, comorbidities and participant's personal goals. Provide advice, education, support and counseling about physical activity/exercise needs.;Develop an individualized exercise prescription for aerobic and resistive training based on initial evaluation findings, risk stratification, comorbidities and participant's personal goals. Provide advice, education, support and counseling about physical activity/exercise  needs.;Develop an individualized exercise prescription for aerobic and resistive training based on initial evaluation findings, risk stratification, comorbidities and participant's personal goals. Provide advice, education, support and counseling about physical activity/exercise needs.;Develop an individualized exercise prescription for aerobic and resistive training based on initial evaluation findings, risk stratification, comorbidities and participant's personal goals.    Expected Outcomes Short Term: Attend rehab on a regular basis to increase amount of physical activity.;Long Term: Add in home exercise to make exercise part of routine and to increase amount of physical activity.;Long Term: Exercising regularly at least 3-5 days a week. Short Term: Attend rehab on a regular basis to increase amount of physical activity.;Long Term: Add in home exercise to make exercise part of routine and to increase amount of physical activity.;Long Term: Exercising regularly at least 3-5 days a week. Short Term: Attend rehab on a regular basis to increase amount of physical activity.;Long Term: Add in home exercise to make exercise part of routine and to increase amount of physical activity.;Long Term: Exercising regularly at least 3-5 days a week. Short Term: Attend rehab on a regular basis to increase amount of physical activity.;Long Term: Add in home exercise to make exercise part of routine and to increase amount of physical activity.;Long Term: Exercising regularly at least 3-5 days a week.    Increase Strength and Stamina Yes Yes Yes Yes    Intervention Provide advice, education, support and counseling about physical activity/exercise needs.;Develop an individualized exercise prescription for aerobic and resistive training based on initial evaluation findings, risk stratification, comorbidities and participant's personal goals. Provide advice, education, support and counseling about physical activity/exercise  needs.;Develop an individualized exercise prescription for aerobic and resistive training based on initial evaluation findings, risk stratification, comorbidities and participant's personal goals. Provide advice, education, support and counseling about physical activity/exercise needs.;Develop an individualized exercise prescription for aerobic and resistive training based on initial evaluation findings, risk stratification, comorbidities and participant's personal goals. Provide advice, education, support and counseling about physical activity/exercise needs.;Develop an individualized exercise prescription for aerobic and resistive training based on initial evaluation findings, risk stratification, comorbidities and participant's personal goals.    Expected Outcomes Short Term: Increase workloads from initial exercise prescription for resistance, speed, and METs.;Short Term: Perform resistance training exercises routinely during rehab and add in resistance training at home;Long Term: Improve cardiorespiratory fitness, muscular endurance and strength as measured by increased METs and functional capacity (6MWT) Short Term: Increase workloads from initial exercise prescription for resistance, speed, and METs.;Short Term: Perform resistance training exercises routinely during rehab and add in resistance training at home;Long Term: Improve cardiorespiratory fitness, muscular endurance and strength as measured by increased METs and functional capacity (6MWT) Short Term: Increase workloads from initial exercise prescription for resistance, speed, and METs.;Short Term: Perform resistance training exercises routinely during rehab and add in resistance training at home;Long  Term: Improve cardiorespiratory fitness, muscular endurance and strength as measured by increased METs and functional capacity (6MWT) Short Term: Increase workloads from initial exercise prescription for resistance, speed, and METs.;Short Term: Perform  resistance training exercises routinely during rehab and add in resistance training at home;Long Term: Improve cardiorespiratory fitness, muscular endurance and strength as measured by increased METs and functional capacity (6MWT)    Able to understand and use rate of perceived exertion (RPE) scale Yes Yes Yes Yes    Intervention Provide education and explanation on how to use RPE scale Provide education and explanation on how to use RPE scale Provide education and explanation on how to use RPE scale Provide education and explanation on how to use RPE scale    Expected Outcomes Short Term: Able to use RPE daily in rehab to express subjective intensity level;Long Term:  Able to use RPE to guide intensity level when exercising independently Short Term: Able to use RPE daily in rehab to express subjective intensity level;Long Term:  Able to use RPE to guide intensity level when exercising independently Short Term: Able to use RPE daily in rehab to express subjective intensity level;Long Term:  Able to use RPE to guide intensity level when exercising independently Short Term: Able to use RPE daily in rehab to express subjective intensity level;Long Term:  Able to use RPE to guide intensity level when exercising independently    Able to understand and use Dyspnea scale Yes Yes Yes Yes    Intervention Provide education and explanation on how to use Dyspnea scale Provide education and explanation on how to use Dyspnea scale Provide education and explanation on how to use Dyspnea scale Provide education and explanation on how to use Dyspnea scale    Expected Outcomes Short Term: Able to use Dyspnea scale daily in rehab to express subjective sense of shortness of breath during exertion;Long Term: Able to use Dyspnea scale to guide intensity level when exercising independently Short Term: Able to use Dyspnea scale daily in rehab to express subjective sense of shortness of breath during exertion;Long Term: Able to use  Dyspnea scale to guide intensity level when exercising independently Short Term: Able to use Dyspnea scale daily in rehab to express subjective sense of shortness of breath during exertion;Long Term: Able to use Dyspnea scale to guide intensity level when exercising independently Short Term: Able to use Dyspnea scale daily in rehab to express subjective sense of shortness of breath during exertion;Long Term: Able to use Dyspnea scale to guide intensity level when exercising independently    Knowledge and understanding of Target Heart Rate Range (THRR) Yes Yes Yes Yes    Intervention Provide education and explanation of THRR including how the numbers were predicted and where they are located for reference Provide education and explanation of THRR including how the numbers were predicted and where they are located for reference Provide education and explanation of THRR including how the numbers were predicted and where they are located for reference Provide education and explanation of THRR including how the numbers were predicted and where they are located for reference    Expected Outcomes Short Term: Able to state/look up THRR;Long Term: Able to use THRR to govern intensity when exercising independently;Short Term: Able to use daily as guideline for intensity in rehab Short Term: Able to state/look up THRR;Long Term: Able to use THRR to govern intensity when exercising independently;Short Term: Able to use daily as guideline for intensity in rehab Short Term: Able to state/look up THRR;Long  Term: Able to use THRR to govern intensity when exercising independently;Short Term: Able to use daily as guideline for intensity in rehab Short Term: Able to state/look up THRR;Long Term: Able to use THRR to govern intensity when exercising independently;Short Term: Able to use daily as guideline for intensity in rehab    Understanding of Exercise Prescription Yes Yes Yes Yes    Intervention Provide education, explanation,  and written materials on patient's individual exercise prescription Provide education, explanation, and written materials on patient's individual exercise prescription Provide education, explanation, and written materials on patient's individual exercise prescription Provide education, explanation, and written materials on patient's individual exercise prescription    Expected Outcomes Short Term: Able to explain program exercise prescription;Long Term: Able to explain home exercise prescription to exercise independently Short Term: Able to explain program exercise prescription;Long Term: Able to explain home exercise prescription to exercise independently Short Term: Able to explain program exercise prescription;Long Term: Able to explain home exercise prescription to exercise independently Short Term: Able to explain program exercise prescription;Long Term: Able to explain home exercise prescription to exercise independently             Exercise Goals Re-Evaluation :  Exercise Goals Re-Evaluation     Row Name 11/23/21 0751 12/18/21 1157 01/16/22 0847         Exercise Goal Re-Evaluation   Exercise Goals Review Increase Physical Activity;Increase Strength and Stamina;Able to understand and use rate of perceived exertion (RPE) scale;Able to understand and use Dyspnea scale;Knowledge and understanding of Target Heart Rate Range (THRR);Understanding of Exercise Prescription Increase Physical Activity;Increase Strength and Stamina;Able to understand and use rate of perceived exertion (RPE) scale;Able to understand and use Dyspnea scale;Knowledge and understanding of Target Heart Rate Range (THRR);Understanding of Exercise Prescription Increase Physical Activity;Increase Strength and Stamina;Able to understand and use rate of perceived exertion (RPE) scale;Able to understand and use Dyspnea scale;Knowledge and understanding of Target Heart Rate Range (THRR);Understanding of Exercise Prescription      Comments Brexley is scheduled to begin exercise this week. Will continue to monitor and progress as able. Afrika has completed 6 exercise sessions. She has missed a few sessions due to illness. Anaija exercises for 15 min on the Nustep and arm ergometer. She averages 1.9 METs at level 2 on the Nustep and 2.0 METs at level 2 on the arm ergometer. She has increased her workload for both exercise modes although METs have remained the same. I have discussed METs and how to increase METs with Hinton Dyer. She is very motivated to exercise, but her arthritis limits her. She performs the warmup and cooldown mostly standing. She does an alternative exercise for squats. Will continue to monitor and progress as able. Jamecia has completed 13 exercise sessions. She has missed a few sessions due to illness. Beyounce exercises for 15 min on the Nustep and arm ergometer. She averages 1.8 METs at level 3 on the Nustep and 1.7 METs at level 3 on the arm ergometer. She has increased her workload for both exercise modes although METs have remained the same. She is very motivated to exercise, but her arthritis limits her. She did have to go off her RA medication at one point, which hindered her progression in rehab. She performs the warmup and cooldown mostly standing. She does an alternative exercise for squats. Will continue to monitor and progress as able.     Expected Outcomes Through exercise at rehab and home, the patient will decrease shortness of breath with daily activities and feel  confident in carrying out an exercise regimen at home. Through exercise at rehab and home, the patient will decrease shortness of breath with daily activities and feel confident in carrying out an exercise regimen at home. Through exercise at rehab and home, the patient will decrease shortness of breath with daily activities and feel confident in carrying out an exercise regimen at home.              Discharge Exercise Prescription (Final Exercise  Prescription Changes):  Exercise Prescription Changes - 01/16/22 1100       Response to Exercise   Blood Pressure (Admit) 118/56    Blood Pressure (Exercise) 126/72    Blood Pressure (Exit) 114/62    Heart Rate (Admit) 83 bpm    Heart Rate (Exercise) 90 bpm    Heart Rate (Exit) 83 bpm    Oxygen Saturation (Admit) 98 %    Oxygen Saturation (Exercise) 97 %    Oxygen Saturation (Exit) 99 %    Rating of Perceived Exertion (Exercise) 13    Perceived Dyspnea (Exercise) 3    Duration Continue with 30 min of aerobic exercise without signs/symptoms of physical distress.    Intensity THRR unchanged      Progression   Progression Continue to progress workloads to maintain intensity without signs/symptoms of physical distress.      Resistance Training   Weight Red bands    Reps 10-15    Time 10 Minutes      NuStep   Level 3    SPM 80    Minutes 15    METs 1.7      Arm Ergometer   Level 3    Minutes 15    METs 1.8             Nutrition:  Target Goals: Understanding of nutrition guidelines, daily intake of sodium <1567m, cholesterol <2065m calories 30% from fat and 7% or less from saturated fats, daily to have 5 or more servings of fruits and vegetables.  Biometrics:  Pre Biometrics - 11/15/21 1054       Pre Biometrics   Grip Strength 8 kg              Nutrition Therapy Plan and Nutrition Goals:  Nutrition Therapy & Goals - 01/23/22 1520       Nutrition Therapy   Diet Heart Healthy Diet/Mediterranean Diet      Personal Nutrition Goals   Nutrition Goal Patient to identify strategies to achieve weight loss of 0.5-2.0# per week.   In progress. Discussed strategies for weight loss including tracking intake, measuring portions, high fiber intake. Patient extremely motivated.   Personal Goal #2 Patient to describe the benefit of including fruits, vegetables, whole grains, low fat dairy, lean protein/plant protein as part of a heart healthy meal plan.   In progress.    Comments Goals in progress. Patient with excellent nutrition knowledge of strategies for weightloss, anti-inflammatory foods, etc.      Intervention Plan   Intervention Prescribe, educate and counsel regarding individualized specific dietary modifications aiming towards targeted core components such as weight, hypertension, lipid management, diabetes, heart failure and other comorbidities.;Nutrition handout(s) given to patient.    Expected Outcomes Short Term Goal: Understand basic principles of dietary content, such as calories, fat, sodium, cholesterol and nutrients.;Short Term Goal: A plan has been developed with personal nutrition goals set during dietitian appointment.;Long Term Goal: Adherence to prescribed nutrition plan.  Nutrition Assessments:  Nutrition Assessments - 01/02/22 1146       Rate Your Plate Scores   Post Score 73            MEDIFICTS Score Key: ?70 Need to make dietary changes  40-70 Heart Healthy Diet ? 40 Therapeutic Level Cholesterol Diet  Flowsheet Row PULMONARY REHAB OTHER RESPIRATORY from 01/02/2022 in [place]  Picture Your Plate Total Score on Discharge 73      Picture Your Plate Scores: <62 Unhealthy dietary pattern with much room for improvement. 41-50 Dietary pattern unlikely to meet recommendations for good health and room for improvement. 51-60 More healthful dietary pattern, with some room for improvement.  >60 Healthy dietary pattern, although there may be some specific behaviors that could be improved.    Nutrition Goals Re-Evaluation:  Nutrition Goals Re-Evaluation     Berino Name 01/23/22 1520             Goals   Current Weight 293 lb 3.4 oz (133 kg)       Expected Outcome Goals in progress. Patient is up ~3.5 since starting with our program. She has been on prednisone multiple times. Patient with good nutrition knowledge despite weight gain.                Nutrition Goals  Discharge (Final Nutrition Goals Re-Evaluation):  Nutrition Goals Re-Evaluation - 01/23/22 1520       Goals   Current Weight 293 lb 3.4 oz (133 kg)    Expected Outcome Goals in progress. Patient is up ~3.5 since starting with our program. She has been on prednisone multiple times. Patient with good nutrition knowledge despite weight gain.             Psychosocial: Target Goals: Acknowledge presence or absence of significant depression and/or stress, maximize coping skills, provide positive support system. Participant is able to verbalize types and ability to use techniques and skills needed for reducing stress and depression.  Initial Review & Psychosocial Screening:  Initial Psych Review & Screening - 11/15/21 1108       Initial Review   Current issues with Current Anxiety/Panic;Current Depression;None Identified   currently seeing counselor     [place]? Yes    Comments husband, daughter, siblings, friends, counselor      Barriers   Psychosocial barriers to participate in program There are no identifiable barriers or psychosocial needs.      Screening Interventions   Interventions Encouraged to exercise             Quality of Life Scores:  Scores of 19 and below usually indicate a poorer quality of life in these areas.  A difference of  2-3 points is a clinically meaningful difference.  A difference of 2-3 points in the total score of the Quality of Life Index has been associated with significant improvement in overall quality of life, self-image, physical symptoms, and general health in studies assessing change in quality of life.  PHQ-9: Review Flowsheet  More data exists      11/15/2021 10/21/2020 10/20/2019 10/13/2018 10/09/2018  Depression screen PHQ 2/9  Decreased Interest 1 0 0 0 3  Down, Depressed, Hopeless 1 0 0 0 3  PHQ - 2 Score 2 0 0 0 6  Altered sleeping 2 0 - 3 3  Tired, decreased energy 2 0 - 3 3  Change in appetite 0 0 - 1  1  Feeling bad  or failure about yourself  1 0 - 0 3  Trouble concentrating 1 0 - 0 0  Moving slowly or fidgety/restless 2 0 - 3 3  Suicidal thoughts 0 0 - 0 0  PHQ-9 Score 10 0 - 10 19  Difficult doing work/chores Somewhat difficult - - Somewhat difficult Not difficult at all   Interpretation of Total Score  Total Score Depression Severity:  1-4 = Minimal depression, 5-9 = Mild depression, 10-14 = Moderate depression, 15-19 = Moderately severe depression, 20-27 = Severe depression   Psychosocial Evaluation and Intervention:  Psychosocial Evaluation - 11/15/21 1109       Psychosocial Evaluation & Interventions   Interventions Encouraged to exercise with the program and follow exercise prescription    Expected Outcomes For Lizet to participate in Pulmonary Rehab    Continue Psychosocial Services  Follow up required by staff             Psychosocial Re-Evaluation:  Psychosocial Re-Evaluation     Sesser Name 12/26/21 0923 01/17/22 1336           Psychosocial Re-Evaluation   Current issues with Current Anxiety/Panic;Current Depression;None Identified Current Anxiety/Panic;Current Depression;None Identified      Comments Derinda has been attending the PR program without any psychosocial barriers. She is on medication foor her anxiety and depression and also has a counselor that she sees routinlely. The issues that she has  been is with pain from her RA and having to be off of her RA medication. She has been very motivated to exercise and pushes herself to even through the pain. She seems to be enjoying the classes and making connections with her classmates. Zoye has been attending PR without any psychosocial barriers. She does have medication prescribed for depression and she also sees a Social worker. She has been able to attend PR with some absences due to RA flairs and bronchietasis acute exacerbations. She has a very postive attitude and really tried to push herself to come even though she  doesn't always feel like it.      Expected Outcomes For her to continue to participate in the PR program without any psychosocial issues or concerns. For her to be able to attend PR without any psychosocial issuses or concerns      Interventions Encouraged to attend Pulmonary Rehabilitation for the exercise Encouraged to attend Pulmonary Rehabilitation for the exercise      Continue Psychosocial Services  -- Follow up required by staff               Psychosocial Discharge (Final Psychosocial Re-Evaluation):  Psychosocial Re-Evaluation - 01/17/22 1336       Psychosocial Re-Evaluation   Current issues with Current Anxiety/Panic;Current Depression;None Identified    Comments Armida has been attending PR without any psychosocial barriers. She does have medication prescribed for depression and she also sees a Social worker. She has been able to attend PR with some absences due to RA flairs and bronchietasis acute exacerbations. She has a very postive attitude and really tried to push herself to come even though she doesn't always feel like it.    Expected Outcomes For her to be able to attend PR without any psychosocial issuses or concerns    Interventions Encouraged to attend Pulmonary Rehabilitation for the exercise    Continue Psychosocial Services  Follow up required by staff             Education: Education Goals: Education classes will be provided on a weekly basis, covering  required topics. Participant will state understanding/return demonstration of topics presented.  Learning Barriers/Preferences:  Learning Barriers/Preferences - 11/15/21 1111       Learning Barriers/Preferences   Learning Barriers Sight   readers and contact lens   Learning Preferences Written Material;Group Instruction;Individual Instruction             Education Topics: Risk Factor Reduction:  -Group instruction that is supported by a PowerPoint presentation. Instructor discusses the definition of a  risk factor, different risk factors for pulmonary disease, and how the heart and lungs work together.     Nutrition for Pulmonary Patient:  -Group instruction provided by PowerPoint slides, verbal discussion, and written materials to support subject matter. The instructor gives an explanation and review of healthy diet recommendations, which includes a discussion on weight management, recommendations for fruit and vegetable consumption, as well as protein, fluid, caffeine, fiber, sodium, sugar, and alcohol. Tips for eating when patients are short of breath are discussed. Flowsheet Row PULMONARY REHAB OTHER RESPIRATORY from 01/18/2022 in [place]  Date 01/18/22  Educator Sam, RD  Instruction Review Code 1- Verbalizes Understanding       Pursed Lip Breathing:  -Group instruction that is supported by demonstration and informational handouts. Instructor discusses the benefits of pursed lip and diaphragmatic breathing and detailed demonstration on how to preform both.     Oxygen Safety:  -Group instruction provided by PowerPoint, verbal discussion, and written material to support subject matter. There is an overview of "What is Oxygen" and "Why do we need it".  Instructor also reviews how to create a safe environment for oxygen use, the importance of using oxygen as prescribed, and the risks of noncompliance. There is a brief discussion on traveling with oxygen and resources the patient may utilize. Flowsheet Row PULMONARY REHAB OTHER RESPIRATORY from 01/18/2022 in [place]  Date 01/11/22  Educator Donnetta Simpers  [Handout]  Instruction Review Code 1- Verbalizes Understanding       Oxygen Equipment:  -Group instruction provided by [employer] Staff utilizing handouts, written materials, and equipment demonstrations.   Signs and Symptoms:  -Group instruction provided by written material and verbal discussion to support subject  matter. Warning signs and symptoms of infection, stroke, and heart attack are reviewed and when to call the physician/911 reinforced. Tips for preventing the spread of infection discussed.   Advanced Directives:  -Group instruction provided by verbal instruction and written material to support subject matter. Instructor reviews Advanced Directive laws and proper instruction for filling out document.   Pulmonary Video:  -Group video education that reviews the importance of medication and oxygen compliance, exercise, good nutrition, pulmonary hygiene, and pursed lip and diaphragmatic breathing for the pulmonary patient.   Exercise for the Pulmonary Patient:  -Group instruction that is supported by a PowerPoint presentation. Instructor discusses benefits of exercise, core components of exercise, frequency, duration, and intensity of an exercise routine, importance of utilizing pulse oximetry during exercise, safety while exercising, and options of places to exercise outside of rehab.   Flowsheet Row PULMONARY REHAB OTHER RESPIRATORY from 01/18/2022 in [place]  Date 12/14/21  Educator Sam  Instruction Review Code 1- Verbalizes Understanding       Pulmonary Medications:  -Verbally interactive group education provided by instructor with focus on inhaled medications and proper administration.   Anatomy and Physiology of the Respiratory System and Intimacy:  -Group instruction provided by PowerPoint, verbal discussion, and written material to  support subject matter. Instructor reviews respiratory cycle and anatomical components of the respiratory system and their functions. Instructor also reviews differences in obstructive and restrictive respiratory diseases with examples of each. Intimacy, Sex, and Sexuality differences are reviewed with a discussion on how relationships can change when diagnosed with pulmonary disease. Common sexual concerns are  reviewed.   MD DAY -A group question and answer session with a medical doctor that allows participants to ask questions that relate to their pulmonary disease state.   OTHER EDUCATION -Group or individual verbal, written, or video instructions that support the educational goals of the pulmonary rehab program. [address] from 12/07/2021 in [place]  Date 11/30/21  Educator Deanne Coffer your numbers]       Holiday Eating Survival Tips:  -Group instruction provided by PowerPoint slides, verbal discussion, and written materials to support subject matter. The instructor gives patients tips, tricks, and techniques to help them not only survive but enjoy the holidays despite the onslaught of food that accompanies the holidays.   Knowledge Questionnaire Score:  Knowledge Questionnaire Score - 01/02/22 1519       Knowledge Questionnaire Score   Post Score 18/18             Core Components/Risk Factors/Patient Goals at Admission:  Personal Goals and Risk Factors at Admission - 11/15/21 1111       Core Components/Risk Factors/Patient Goals on Admission    Weight Management Yes;Weight Loss    Improve shortness of breath with ADL's Yes    Intervention Provide education, individualized exercise plan and daily activity instruction to help decrease symptoms of SOB with activities of daily living.    Expected Outcomes Short Term: Improve cardiorespiratory fitness to achieve a reduction of symptoms when performing ADLs;Long Term: Be able to perform more ADLs without symptoms or delay the onset of symptoms             Core Components/Risk Factors/Patient Goals Review:   Goals and Risk Factor Review     Row Name 12/26/21 0630 01/17/22 1349           Core Components/Risk Factors/Patient Goals Review   Personal Goals Review Weight Management/Obesity;Improve shortness of breath with ADL's;Increase knowledge of  respiratory medications and ability to use respiratory devices properly.;Develop more efficient breathing techniques such as purse lipped breathing and diaphragmatic breathing and practicing self-pacing with activity. Weight Management/Obesity;Improve shortness of breath with ADL's;Develop more efficient breathing techniques such as purse lipped breathing and diaphragmatic breathing and practicing self-pacing with activity.;Increase knowledge of respiratory medications and ability to use respiratory devices properly.      Review Krisanne has been making progress with her workloads and MET levels on the nustep and arm egometer. Her progress has been slow due the her pain issues. Her oxygen saturations have been96-97% on room air and she reports her SOB has been mld with difficulty to moderate difficulty.Her weight has  remained stable no gain or loss. although she has been working with the [facility] on making better choices with her diet. She reports that having to be on the steroids always effects her appetite and gives her cravings. Kenitha has been enjoying the PR program and connectin with he classmates. She is very motived to exercise and push herself. She is exercising on the nustep and arm egometer.She has made some progress on both peices of equipment with her workloads and Mets levels despite her RA flairs and lung exacerbations.  Expected Outcomes See admission goals. See admission goals. For her to continue to progress with her exercise workloads and Mets.               Core Components/Risk Factors/Patient Goals at Discharge (Final Review):   Goals and Risk Factor Review - 01/17/22 1349       Core Components/Risk Factors/Patient Goals Review   Personal Goals Review Weight Management/Obesity;Improve shortness of breath with ADL's;Develop more efficient breathing techniques such as purse lipped breathing and diaphragmatic breathing and practicing self-pacing with activity.;Increase knowledge of  respiratory medications and ability to use respiratory devices properly.    Review Laranda has been enjoying the PR program and connectin with he classmates. She is very motived to exercise and push herself. She is exercising on the nustep and arm egometer.She has made some progress on both peices of equipment with her workloads and Mets levels despite her RA flairs and lung exacerbations.    Expected Outcomes See admission goals. For her to continue to progress with her exercise workloads and Mets.             ITP Comments: Pt is making expected progress toward Pulmonary Rehab goals after completing 15 sessions. Recommend continued exercise, life style modification, education, and utilization of breathing techniques to increase stamina and strength, while also decreasing shortness of breath with exertion.  Dr. Rodman Pickle is Medical Director for Pulmonary Rehab at [facility].

## 2022-01-24 NOTE — Telephone Encounter (Signed)
Patient called the office stating her primary put her on a 7 day course of antibiotics. Patient states she has stopped her imuran and wants to know what else she needs to do.

## 2022-01-25 ENCOUNTER — Encounter (HOSPITAL_COMMUNITY): Payer: Federal, State, Local not specified - PPO

## 2022-01-29 ENCOUNTER — Ambulatory Visit: Payer: Federal, State, Local not specified - PPO | Admitting: Nurse Practitioner

## 2022-01-29 ENCOUNTER — Encounter: Payer: Self-pay | Admitting: Nurse Practitioner

## 2022-01-29 VITALS — BP 130/74 | HR 77 | Temp 98.4°F | Ht 68.0 in | Wt 295.4 lb

## 2022-01-29 DIAGNOSIS — G4734 Idiopathic sleep related nonobstructive alveolar hypoventilation: Secondary | ICD-10-CM

## 2022-01-29 DIAGNOSIS — J849 Interstitial pulmonary disease, unspecified: Secondary | ICD-10-CM | POA: Diagnosis not present

## 2022-01-29 DIAGNOSIS — G4733 Obstructive sleep apnea (adult) (pediatric): Secondary | ICD-10-CM | POA: Diagnosis not present

## 2022-01-29 DIAGNOSIS — J471 Bronchiectasis with (acute) exacerbation: Secondary | ICD-10-CM | POA: Diagnosis not present

## 2022-01-29 MED ORDER — SODIUM CHLORIDE 3 % IN NEBU: INHALATION_SOLUTION | RESPIRATORY_TRACT | 12 refills | Status: DC | PRN

## 2022-01-30 ENCOUNTER — Encounter (HOSPITAL_COMMUNITY): Payer: Federal, State, Local not specified - PPO

## 2022-01-31 ENCOUNTER — Telehealth: Payer: Self-pay | Admitting: Nurse Practitioner

## 2022-01-31 NOTE — Telephone Encounter (Signed)
Called and spoke with pharmacist. He stated that they could not fill her sodium chloride without the directions. I advised him that the patient is supposed to the solution twice a daily as needed. He verbalized understanding and was able to fix the prescription.   Nothing further needed at time of call.

## 2022-02-01 DIAGNOSIS — F410 Panic disorder [episodic paroxysmal anxiety] without agoraphobia: Secondary | ICD-10-CM | POA: Diagnosis not present

## 2022-02-05 DIAGNOSIS — M069 Rheumatoid arthritis, unspecified: Secondary | ICD-10-CM | POA: Diagnosis not present

## 2022-02-05 DIAGNOSIS — G43909 Migraine, unspecified, not intractable, without status migrainosus: Secondary | ICD-10-CM | POA: Diagnosis not present

## 2022-02-05 DIAGNOSIS — I773 Arterial fibromuscular dysplasia: Secondary | ICD-10-CM | POA: Diagnosis not present

## 2022-02-07 DIAGNOSIS — F410 Panic disorder [episodic paroxysmal anxiety] without agoraphobia: Secondary | ICD-10-CM | POA: Diagnosis not present

## 2022-02-08 ENCOUNTER — Encounter (HOSPITAL_COMMUNITY)
Admission: RE | Admit: 2022-02-08 | Discharge: 2022-02-08 | Disposition: A | Discharge status: Home / Self Care | Payer: Federal, State, Local not specified - PPO | Source: Ambulatory Visit | Attending: Pulmonary Disease | Admitting: Pulmonary Disease

## 2022-02-08 ENCOUNTER — Telehealth: Payer: Self-pay

## 2022-02-08 ENCOUNTER — Other Ambulatory Visit: Payer: Self-pay | Admitting: Adult Health

## 2022-02-08 ENCOUNTER — Encounter: Payer: Self-pay | Admitting: Adult Health

## 2022-02-08 DIAGNOSIS — J849 Interstitial pulmonary disease, unspecified: Secondary | ICD-10-CM | POA: Insufficient documentation

## 2022-02-08 DIAGNOSIS — Z5189 Encounter for other specified aftercare: Secondary | ICD-10-CM | POA: Diagnosis not present

## 2022-02-08 DIAGNOSIS — I10 Essential (primary) hypertension: Secondary | ICD-10-CM | POA: Diagnosis not present

## 2022-02-08 MED ORDER — NURTEC 75 MG PO TBDP
75.0000 mg | ORAL_TABLET | Freq: Every day | ORAL | 0 refills | Status: DC | PRN
Start: 2022-02-08 — End: 2022-03-20

## 2022-02-08 NOTE — Progress Notes (Signed)
Daily Session Note  Patient Details  Name: Jill Harper MRN: [id number] Date of Birth: 29-Jun-1966 Referring Provider:   April Manson Pulmonary Rehab Walk Test from 11/15/2021 in [place]  Referring Provider Sood       Encounter Date: 02/08/2022  Check In:  Session Check In - 02/08/22 1125       Check-In   Supervising physician immediately available to respond to emergencies [facility] immediately available    Physician(s) Dr. Grandville Silos    Location [facility]    Staff Present Rosebud Poles, RN, BSN;Lisa Ysidro Evert, Cathleen Fears, MS, ACSM-CEP, Exercise Physiologist    Virtual Visit No    Medication changes reported     No    Fall or balance concerns reported    No    Tobacco Cessation No Change    Warm-up and Cool-down Performed as group-led instruction    Resistance Training Performed Yes    VAD Patient? No    PAD/SET Patient? No      Pain Assessment   Currently in Pain? No/denies             Capillary Blood Glucose: No results found for this or any previous visit (from the past 24 hour(s)).    Social History   Tobacco Use  Smoking Status Never   Passive exposure: Never  Smokeless Tobacco Never  Tobacco Comments   Married, lives with spouse. 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 Completed post 6 MWT.    Dr. Rodman Pickle is Medical Director for Pulmonary Rehab at [facility].

## 2022-02-08 NOTE — Telephone Encounter (Signed)
Called to discuss PREP program, she has been sick, and has missed going to pulm rehab; she is not ready to start just yet, wants to feel better/stronger before she begins another program; let her know about 8/8 class, at [facility] she will call me toward the end of July to let me know if she can/wants to attend.

## 2022-02-08 NOTE — Progress Notes (Signed)
Discharge Progress Report  Patient Details  Name: Jill Harper MRN: [id number] Date of Birth: [birth year] Referring Provider:   April Manson [facility] Walk Test from 11/15/2021 in [place]  Referring Provider [place]        Number of Visits: 16  Reason for Discharge:  Patient has met program and personal goals.  Smoking History:  Social History   Tobacco Use  Smoking Status Never   Passive exposure: Never  Smokeless Tobacco Never  Tobacco Comments   Married, lives with spouse. 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    Diagnosis:  ILD (interstitial lung disease) ([place])  Hypertension, unspecified type  ADL UCSD:  Pulmonary Assessment Scores     Row Name 11/15/21 1122 01/02/22 1522 02/08/22 1201     ADL UCSD   ADL Phase Entry Exit --   SOB Score total 70 63 --     CAT Score   CAT Score 28 21 --     mMRC Score   mMRC Score 4 -- 4            Initial Exercise Prescription:  Initial Exercise Prescription - 11/15/21 1200       Date of Initial Exercise RX and Referring Provider   Date 11/15/21    Referring Provider Sood    Expected Discharge Date 01/18/22      NuStep   Level 1    SPM 80    Minutes 15    METs 1.5      Arm Ergometer   Level 1    Watts 2    Minutes 15      Prescription Details   Frequency (times per week) 2    Duration Progress to 30 minutes of continuous aerobic without signs/symptoms of physical distress      Intensity   THRR 40-80% of Max Heartrate 66-132    Ratings of Perceived Exertion 11-13    Perceived Dyspnea 0-4      Progression   Progression Continue to progress workloads to maintain intensity without signs/symptoms of physical distress.      Resistance Training   Training Prescription Yes    Weight yellow bands    Reps 10-15             Discharge Exercise Prescription (Final Exercise Prescription Changes):  Exercise Prescription Changes -  01/18/22 1202       Response to Exercise   Blood Pressure (Admit) 122/76    Blood Pressure (Exit) 118/80    Heart Rate (Admit) 98 bpm    Heart Rate (Exercise) 102 bpm    Heart Rate (Exit) 91 bpm    Oxygen Saturation (Admit) 99 %    Oxygen Saturation (Exercise) 97 %    Oxygen Saturation (Exit) 98 %    Rating of Perceived Exertion (Exercise) 14    Perceived Dyspnea (Exercise) 2    Duration Continue with 30 min of aerobic exercise without signs/symptoms of physical distress.    Intensity THRR unchanged      Progression   Progression Continue to progress workloads to maintain intensity without signs/symptoms of physical distress.      Resistance Training   Weight Red bands    Reps 10-15    Time 10 Minutes      NuStep   Level 4    SPM 80    Minutes 15    METs 1.9      Arm Ergometer  Level 4    Minutes 15    METs 1.7             Functional Capacity:  6 Minute Walk     Row Name 11/15/21 1257 02/08/22 1157       6 Minute Walk   Phase Initial Discharge    Distance 1230 feet 1264 feet    Distance % Change -- 2.76 %    Distance Feet Change -- 34 ft    Walk Time 6 minutes 6 minutes    # of Rest Breaks 0 0    MPH 2.33 2.58    METS 3.19 3.27    RPE 17 15    Perceived Dyspnea  3 3    VO2 Peak 11.15 11.45    Symptoms No No    Resting HR 80 bpm 83 bpm    Resting BP 116/64 130/70    Resting Oxygen Saturation  97 % 98 %    Exercise Oxygen Saturation  during 6 min walk 95 % 93 %    Max Ex. HR 144 bpm 129 bpm    Max Ex. BP 148/70 150/66    2 Minute Post BP 130/70 128/70      Interval HR   1 Minute HR 112 110    2 Minute HR 110 123    3 Minute HR 115 129    4 Minute HR 133 126    5 Minute HR 141 126    6 Minute HR 144 127    2 Minute Post HR 103 96    Interval Heart Rate? Yes Yes      Interval Oxygen   Interval Oxygen? Yes Yes    Baseline Oxygen Saturation % 97 % 98 %    1 Minute Oxygen Saturation % 96 % 97 %    1 Minute Liters of Oxygen 0 L 0 L    2  Minute Oxygen Saturation % 95 % 95 %    2 Minute Liters of Oxygen 0 L 0 L    3 Minute Oxygen Saturation % 97 % 96 %    3 Minute Liters of Oxygen 0 L 0 L    4 Minute Oxygen Saturation % 97 % 94 %    4 Minute Liters of Oxygen 0 L 0 L    5 Minute Oxygen Saturation % 95 % 94 %    5 Minute Liters of Oxygen 0 L 0 L    6 Minute Oxygen Saturation % 96 % 93 %    6 Minute Liters of Oxygen 0 L 0 L    2 Minute Post Oxygen Saturation % 98 % 98 %    2 Minute Post Liters of Oxygen 0 L 0 L             Psychological, QOL, Others - Outcomes: PHQ 2/9:    02/08/2022   11:58 AM 11/15/2021   11:04 AM 10/21/2020    4:49 PM 10/20/2019    3:33 PM 10/13/2018   11:19 AM  Depression screen PHQ 2/9  Decreased Interest 0 1 0 0 0  Down, Depressed, Hopeless 0 1 0 0 0  PHQ - 2 Score 0 2 0 0 0  Altered sleeping 0 2 0  3  Tired, decreased energy 0 2 0  3  Change in appetite 0 0 0  1  Feeling bad or failure about yourself  0 1 0  0  Trouble concentrating 0 1 0  0  Moving slowly or fidgety/restless 0 2 0  3  Suicidal thoughts 0 0 0  0  PHQ-9 Score 0 10 0  10  Difficult doing work/chores  Somewhat difficult   Somewhat difficult    Quality of Life:   Personal Goals: Goals established at orientation with interventions provided to work toward goal.  Personal Goals and Risk Factors at Admission - 11/15/21 1111       Core Components/Risk Factors/Patient Goals on Admission    Weight Management Yes;Weight Loss    Improve shortness of breath with ADL's Yes    Intervention Provide education, individualized exercise plan and daily activity instruction to help decrease symptoms of SOB with activities of daily living.    Expected Outcomes Short Term: Improve cardiorespiratory fitness to achieve a reduction of symptoms when performing ADLs;Long Term: Be able to perform more ADLs without symptoms or delay the onset of symptoms              Personal Goals Discharge:  Goals and Risk Factor Review     Row Name  12/26/21 2409 01/17/22 1349           Core Components/Risk Factors/Patient Goals Review   Personal Goals Review Weight Management/Obesity;Improve shortness of breath with ADL's;Increase knowledge of respiratory medications and ability to use respiratory devices properly.;Develop more efficient breathing techniques such as purse lipped breathing and diaphragmatic breathing and practicing self-pacing with activity. Weight Management/Obesity;Improve shortness of breath with ADL's;Develop more efficient breathing techniques such as purse lipped breathing and diaphragmatic breathing and practicing self-pacing with activity.;Increase knowledge of respiratory medications and ability to use respiratory devices properly.      Review Athleen has been making progress with her workloads and MET levels on the nustep and arm egometer. Her progress has been slow due the her pain issues. Her oxygen saturations have been96-97% on room air and she reports her SOB has been mld with difficulty to moderate difficulty.Her weight has  remained stable no gain or loss. although she has been working with the [employer] on making better choices with her diet. She reports that having to be on the steroids always effects her appetite and gives her cravings. Tatum has been enjoying the PR program and connectin with he classmates. She is very motived to exercise and push herself. She is exercising on the nustep and arm egometer.She has made some progress on both peices of equipment with her workloads and Mets levels despite her RA flairs and lung exacerbations.      Expected Outcomes See admission goals. See admission goals. For her to continue to progress with her exercise workloads and Mets.               Exercise Goals and Review:  Exercise Goals     Row Name 11/15/21 1112 11/23/21 0751 12/18/21 1156 01/16/22 0847       Exercise Goals   Increase Physical Activity Yes Yes Yes Yes    Intervention Provide advice, education,  support and counseling about physical activity/exercise needs.;Develop an individualized exercise prescription for aerobic and resistive training based on initial evaluation findings, risk stratification, comorbidities and participant's personal goals. Provide advice, education, support and counseling about physical activity/exercise needs.;Develop an individualized exercise prescription for aerobic and resistive training based on initial evaluation findings, risk stratification, comorbidities and participant's personal goals. Provide advice, education, support and counseling about physical activity/exercise needs.;Develop an individualized exercise prescription for aerobic and resistive training based on initial evaluation findings, risk stratification, comorbidities and participant's personal goals.  Provide advice, education, support and counseling about physical activity/exercise needs.;Develop an individualized exercise prescription for aerobic and resistive training based on initial evaluation findings, risk stratification, comorbidities and participant's personal goals.    Expected Outcomes Short Term: Attend rehab on a regular basis to increase amount of physical activity.;Long Term: Add in home exercise to make exercise part of routine and to increase amount of physical activity.;Long Term: Exercising regularly at least 3-5 days a week. Short Term: Attend rehab on a regular basis to increase amount of physical activity.;Long Term: Add in home exercise to make exercise part of routine and to increase amount of physical activity.;Long Term: Exercising regularly at least 3-5 days a week. Short Term: Attend rehab on a regular basis to increase amount of physical activity.;Long Term: Add in home exercise to make exercise part of routine and to increase amount of physical activity.;Long Term: Exercising regularly at least 3-5 days a week. Short Term: Attend rehab on a regular basis to increase amount of physical  activity.;Long Term: Add in home exercise to make exercise part of routine and to increase amount of physical activity.;Long Term: Exercising regularly at least 3-5 days a week.    Increase Strength and Stamina Yes Yes Yes Yes    Intervention Provide advice, education, support and counseling about physical activity/exercise needs.;Develop an individualized exercise prescription for aerobic and resistive training based on initial evaluation findings, risk stratification, comorbidities and participant's personal goals. Provide advice, education, support and counseling about physical activity/exercise needs.;Develop an individualized exercise prescription for aerobic and resistive training based on initial evaluation findings, risk stratification, comorbidities and participant's personal goals. Provide advice, education, support and counseling about physical activity/exercise needs.;Develop an individualized exercise prescription for aerobic and resistive training based on initial evaluation findings, risk stratification, comorbidities and participant's personal goals. Provide advice, education, support and counseling about physical activity/exercise needs.;Develop an individualized exercise prescription for aerobic and resistive training based on initial evaluation findings, risk stratification, comorbidities and participant's personal goals.    Expected Outcomes Short Term: Increase workloads from initial exercise prescription for resistance, speed, and METs.;Short Term: Perform resistance training exercises routinely during rehab and add in resistance training at home;Long Term: Improve cardiorespiratory fitness, muscular endurance and strength as measured by increased METs and functional capacity (6MWT) Short Term: Increase workloads from initial exercise prescription for resistance, speed, and METs.;Short Term: Perform resistance training exercises routinely during rehab and add in resistance training at  home;Long Term: Improve cardiorespiratory fitness, muscular endurance and strength as measured by increased METs and functional capacity (6MWT) Short Term: Increase workloads from initial exercise prescription for resistance, speed, and METs.;Short Term: Perform resistance training exercises routinely during rehab and add in resistance training at home;Long Term: Improve cardiorespiratory fitness, muscular endurance and strength as measured by increased METs and functional capacity (6MWT) Short Term: Increase workloads from initial exercise prescription for resistance, speed, and METs.;Short Term: Perform resistance training exercises routinely during rehab and add in resistance training at home;Long Term: Improve cardiorespiratory fitness, muscular endurance and strength as measured by increased METs and functional capacity (6MWT)    Able to understand and use rate of perceived exertion (RPE) scale Yes Yes Yes Yes    Intervention Provide education and explanation on how to use RPE scale Provide education and explanation on how to use RPE scale Provide education and explanation on how to use RPE scale Provide education and explanation on how to use RPE scale    Expected Outcomes Short Term: Able to use RPE daily  in rehab to express subjective intensity level;Long Term:  Able to use RPE to guide intensity level when exercising independently Short Term: Able to use RPE daily in rehab to express subjective intensity level;Long Term:  Able to use RPE to guide intensity level when exercising independently Short Term: Able to use RPE daily in rehab to express subjective intensity level;Long Term:  Able to use RPE to guide intensity level when exercising independently Short Term: Able to use RPE daily in rehab to express subjective intensity level;Long Term:  Able to use RPE to guide intensity level when exercising independently    Able to understand and use Dyspnea scale Yes Yes Yes Yes    Intervention Provide  education and explanation on how to use Dyspnea scale Provide education and explanation on how to use Dyspnea scale Provide education and explanation on how to use Dyspnea scale Provide education and explanation on how to use Dyspnea scale    Expected Outcomes Short Term: Able to use Dyspnea scale daily in rehab to express subjective sense of shortness of breath during exertion;Long Term: Able to use Dyspnea scale to guide intensity level when exercising independently Short Term: Able to use Dyspnea scale daily in rehab to express subjective sense of shortness of breath during exertion;Long Term: Able to use Dyspnea scale to guide intensity level when exercising independently Short Term: Able to use Dyspnea scale daily in rehab to express subjective sense of shortness of breath during exertion;Long Term: Able to use Dyspnea scale to guide intensity level when exercising independently Short Term: Able to use Dyspnea scale daily in rehab to express subjective sense of shortness of breath during exertion;Long Term: Able to use Dyspnea scale to guide intensity level when exercising independently    Knowledge and understanding of Target Heart Rate Range (THRR) Yes Yes Yes Yes    Intervention Provide education and explanation of THRR including how the numbers were predicted and where they are located for reference Provide education and explanation of THRR including how the numbers were predicted and where they are located for reference Provide education and explanation of THRR including how the numbers were predicted and where they are located for reference Provide education and explanation of THRR including how the numbers were predicted and where they are located for reference    Expected Outcomes Short Term: Able to state/look up THRR;Long Term: Able to use THRR to govern intensity when exercising independently;Short Term: Able to use daily as guideline for intensity in rehab Short Term: Able to state/look up  THRR;Long Term: Able to use THRR to govern intensity when exercising independently;Short Term: Able to use daily as guideline for intensity in rehab Short Term: Able to state/look up THRR;Long Term: Able to use THRR to govern intensity when exercising independently;Short Term: Able to use daily as guideline for intensity in rehab Short Term: Able to state/look up THRR;Long Term: Able to use THRR to govern intensity when exercising independently;Short Term: Able to use daily as guideline for intensity in rehab    Understanding of Exercise Prescription Yes Yes Yes Yes    Intervention Provide education, explanation, and written materials on patient's individual exercise prescription Provide education, explanation, and written materials on patient's individual exercise prescription Provide education, explanation, and written materials on patient's individual exercise prescription Provide education, explanation, and written materials on patient's individual exercise prescription    Expected Outcomes Short Term: Able to explain program exercise prescription;Long Term: Able to explain home exercise prescription to exercise independently Short Term: Able  to explain program exercise prescription;Long Term: Able to explain home exercise prescription to exercise independently Short Term: Able to explain program exercise prescription;Long Term: Able to explain home exercise prescription to exercise independently Short Term: Able to explain program exercise prescription;Long Term: Able to explain home exercise prescription to exercise independently             Exercise Goals Re-Evaluation:  Exercise Goals Re-Evaluation     Row Name 11/23/21 0751 12/18/21 1157 01/16/22 0847         Exercise Goal Re-Evaluation   Exercise Goals Review Increase Physical Activity;Increase Strength and Stamina;Able to understand and use rate of perceived exertion (RPE) scale;Able to understand and use Dyspnea scale;Knowledge and  understanding of Target Heart Rate Range (THRR);Understanding of Exercise Prescription Increase Physical Activity;Increase Strength and Stamina;Able to understand and use rate of perceived exertion (RPE) scale;Able to understand and use Dyspnea scale;Knowledge and understanding of Target Heart Rate Range (THRR);Understanding of Exercise Prescription Increase Physical Activity;Increase Strength and Stamina;Able to understand and use rate of perceived exertion (RPE) scale;Able to understand and use Dyspnea scale;Knowledge and understanding of Target Heart Rate Range (THRR);Understanding of Exercise Prescription     Comments Makeya is scheduled to begin exercise this week. Will continue to monitor and progress as able. Adriene has completed 6 exercise sessions. She has missed a few sessions due to illness. Eugene exercises for 15 min on the Nustep and arm ergometer. She averages 1.9 METs at level 2 on the Nustep and 2.0 METs at level 2 on the arm ergometer. She has increased her workload for both exercise modes although METs have remained the same. I have discussed METs and how to increase METs with Hinton Dyer. She is very motivated to exercise, but her arthritis limits her. She performs the warmup and cooldown mostly standing. She does an alternative exercise for squats. Will continue to monitor and progress as able. Tatyana has completed 13 exercise sessions. She has missed a few sessions due to illness. Paullette exercises for 15 min on the Nustep and arm ergometer. She averages 1.8 METs at level 3 on the Nustep and 1.7 METs at level 3 on the arm ergometer. She has increased her workload for both exercise modes although METs have remained the same. She is very motivated to exercise, but her arthritis limits her. She did have to go off her RA medication at one point, which hindered her progression in rehab. She performs the warmup and cooldown mostly standing. She does an alternative exercise for squats. Will continue to monitor and  progress as able.     Expected Outcomes Through exercise at rehab and home, the patient will decrease shortness of breath with daily activities and feel confident in carrying out an exercise regimen at home. Through exercise at rehab and home, the patient will decrease shortness of breath with daily activities and feel confident in carrying out an exercise regimen at home. Through exercise at rehab and home, the patient will decrease shortness of breath with daily activities and feel confident in carrying out an exercise regimen at home.              Nutrition & Weight - Outcomes:  Pre Biometrics - 11/15/21 1054       Pre Biometrics   Grip Strength 8 kg             Post Biometrics - 02/08/22 1200        Post  Biometrics   Grip Strength 25 kg  Nutrition:  Nutrition Therapy & Goals - 02/08/22 1141       Nutrition Therapy   Diet Heart Healthy Diet/Mediterranean Diet      Personal Nutrition Goals   Nutrition Goal Patient to identify strategies to achieve weight loss of 0.5-2.0# per week.   In progress. Discussed strategies for weight loss including tracking intake, measuring portions, high fiber intake. Patient extremely motivated.   Personal Goal #2 Patient to describe the benefit of including fruits, vegetables, whole grains, low fat dairy, lean protein/plant protein as part of a heart healthy meal plan.   In progress.   Comments Patient graduates today. Goals in progress. Patient with excellent nutrition knowledge of strategies for weightloss, anti-inflammatory foods, managing insulin resistance.      Intervention Plan   Intervention Prescribe, educate and counsel regarding individualized specific dietary modifications aiming towards targeted core components such as weight, hypertension, lipid management, diabetes, heart failure and other comorbidities.    Expected Outcomes Short Term Goal: Understand basic principles of dietary content, such as calories, fat,  sodium, cholesterol and nutrients.;Short Term Goal: A plan has been developed with personal nutrition goals set during dietitian appointment.;Long Term Goal: Adherence to prescribed nutrition plan.             Nutrition Discharge:  Nutrition Assessments - 01/02/22 1146       Rate Your Plate Scores   Post Score 73             Education Questionnaire Score:  Knowledge Questionnaire Score - 01/02/22 1519       Knowledge Questionnaire Score   Post Score 18/18             Goals reviewed with patient; copy given to patient.

## 2022-02-10 ENCOUNTER — Other Ambulatory Visit: Payer: Self-pay | Admitting: Adult Health

## 2022-02-13 ENCOUNTER — Telehealth: Payer: Self-pay | Admitting: *Deleted

## 2022-02-13 NOTE — Telephone Encounter (Signed)
[employer]urtec PA, Key: [id number], per CMM no eligibility found. Called [facility] PA dept, spoke with Anderson Malta who stated [employer] handles her PA's, [phone]. She transferred call, spoke with Jan to initiate PA.  Clinical questions answered, approved 01/14/22 - 02/13/23,  #56 tabs every 90 days. Faxed approval to pharmacy.

## 2022-02-14 ENCOUNTER — Other Ambulatory Visit: Payer: Self-pay | Admitting: *Deleted

## 2022-02-14 DIAGNOSIS — M3320 Polymyositis, organ involvement unspecified: Secondary | ICD-10-CM

## 2022-02-14 DIAGNOSIS — Z79899 Other long term (current) drug therapy: Secondary | ICD-10-CM

## 2022-02-14 DIAGNOSIS — Z111 Encounter for screening for respiratory tuberculosis: Secondary | ICD-10-CM

## 2022-02-14 MED ORDER — AZATHIOPRINE 50 MG PO TABS: ORAL_TABLET | ORAL | 2 refills | Status: DC

## 2022-02-14 NOTE — Telephone Encounter (Signed)
Refill request received via fax  Next Visit: 03/28/2022  Last Visit: 12/26/2021  Last Fill: 08/03/2021  DX: Rheumatoid arthritis involving multiple sites with positive rheumatoid factor   Current Dose per office note 12/26/2021: Imuran 100 mg by mouth daily  Labs: 11/02/2021 CBC and CMP WNL.  CK WNL.   Patient advised she is due for labs. Patient states she will come by the office today to update.   Okay to refill Imuran?

## 2022-02-15 ENCOUNTER — Ambulatory Visit: Payer: Federal, State, Local not specified - PPO | Admitting: Physician Assistant

## 2022-02-15 ENCOUNTER — Encounter: Payer: Self-pay | Admitting: Physician Assistant

## 2022-02-15 DIAGNOSIS — Z0271 Encounter for disability determination: Secondary | ICD-10-CM

## 2022-02-15 DIAGNOSIS — Z1283 Encounter for screening for malignant neoplasm of skin: Secondary | ICD-10-CM | POA: Diagnosis not present

## 2022-02-15 DIAGNOSIS — F410 Panic disorder [episodic paroxysmal anxiety] without agoraphobia: Secondary | ICD-10-CM | POA: Diagnosis not present

## 2022-02-15 NOTE — Progress Notes (Signed)
CBC and CMP WNL.  CK WNL.

## 2022-02-17 LAB — COMPLETE METABOLIC PANEL WITH GFR
AG Ratio: 1.3 (calc) (ref 1.0–2.5)
ALT: 15 U/L (ref 6–29)
AST: 18 U/L (ref 10–35)
Albumin: 4.1 g/dL (ref 3.6–5.1)
Alkaline phosphatase (APISO): 59 U/L (ref 37–153)
BUN: 11 mg/dL (ref 7–25)
CO2: 26 mmol/L (ref 20–32)
Calcium: 9.6 mg/dL (ref 8.6–10.4)
Chloride: 107 mmol/L (ref 98–110)
Creat: 0.71 mg/dL (ref 0.50–1.03)
Globulin: 3.2 g/dL (calc) (ref 1.9–3.7)
Glucose, Bld: 98 mg/dL (ref 65–99)
Potassium: 4.5 mmol/L (ref 3.5–5.3)
Sodium: 140 mmol/L (ref 135–146)
Total Bilirubin: 0.4 mg/dL (ref 0.2–1.2)
Total Protein: 7.3 g/dL (ref 6.1–8.1)
eGFR: 100 mL/min/{1.73_m2} (ref >=60)

## 2022-02-17 LAB — CBC WITH DIFFERENTIAL/PLATELET
Absolute Monocytes: 804 cells/uL (ref 200–950)
Basophils Absolute: 33 cells/uL (ref 0–200)
Basophils Relative: 0.4 %
Eosinophils Absolute: 197 cells/uL (ref 15–500)
Eosinophils Relative: 2.4 %
HCT: 40.5 % (ref 35.0–45.0)
Hemoglobin: 13.3 g/dL (ref 11.7–15.5)
Lymphs Abs: 1755 cells/uL (ref 850–3900)
MCH: 28.5 pg (ref 27.0–33.0)
MCHC: 32.8 g/dL (ref 32.0–36.0)
MCV: 86.7 fL (ref 80.0–100.0)
MPV: 10.7 fL (ref 7.5–12.5)
Monocytes Relative: 9.8 %
Neutro Abs: 5412 cells/uL (ref 1500–7800)
Neutrophils Relative %: 66 %
Platelets: 296 10*3/uL (ref 140–400)
RBC: 4.67 10*6/uL (ref 3.80–5.10)
RDW: 12.4 % (ref 11.0–15.0)
Total Lymphocyte: 21.4 %
WBC: 8.2 10*3/uL (ref 3.8–10.8)

## 2022-02-17 LAB — QUANTIFERON-TB GOLD PLUS
Mitogen-NIL: 8.01 IU/mL
NIL: 0.03 IU/mL
QuantiFERON-TB Gold Plus: NEGATIVE
TB1-NIL: 0 IU/mL
TB2-NIL: 0 IU/mL

## 2022-02-17 LAB — CK: Total CK: 54 U/L (ref 29–143)

## 2022-02-19 NOTE — Progress Notes (Signed)
TB gold negative

## 2022-02-20 ENCOUNTER — Telehealth: Payer: Self-pay | Admitting: Rheumatology

## 2022-02-20 NOTE — Telephone Encounter (Signed)
Will authorize new device once [facility] has reached out.

## 2022-02-20 NOTE — Telephone Encounter (Signed)
Patient called the office stating [facility] is going to reach out requesting a new prescription for an Enbrel autotouch device soon because hers expired. Patient called to make the office aware.

## 2022-02-21 NOTE — Telephone Encounter (Signed)
Received fax from [place] to authorize replacement of Autotouch device. Spoke with Dorothea Ogle, [facility], and they will mail out.  [facility] Phone: [phone]  Knox Saliva, PharmD, MPH, BCPS, CPP Clinical Pharmacist (Rheumatology and Pulmonology)

## 2022-02-22 ENCOUNTER — Other Ambulatory Visit: Payer: Self-pay | Admitting: Internal Medicine

## 2022-02-22 DIAGNOSIS — F410 Panic disorder [episodic paroxysmal anxiety] without agoraphobia: Secondary | ICD-10-CM | POA: Diagnosis not present

## 2022-02-26 ENCOUNTER — Telehealth: Payer: Self-pay | Admitting: Nurse Practitioner

## 2022-02-26 MED ORDER — TRELEGY ELLIPTA 100-62.5-25 MCG/ACT IN AEPB
1.0000 | INHALATION_SPRAY | Freq: Every day | RESPIRATORY_TRACT | 5 refills | Status: DC
Start: 2022-02-26 — End: 2022-08-20

## 2022-02-26 NOTE — Telephone Encounter (Signed)
Called and spoke with patient. Patient verified pharmacy. Refill of trelegy was sent in.   Nothing further needed.

## 2022-02-28 DIAGNOSIS — F410 Panic disorder [episodic paroxysmal anxiety] without agoraphobia: Secondary | ICD-10-CM | POA: Diagnosis not present

## 2022-03-02 ENCOUNTER — Telehealth: Payer: Self-pay

## 2022-03-02 NOTE — Telephone Encounter (Signed)
Called to see if she wants to attend next PREP class at [facility] on August 8, left voicemail

## 2022-03-06 ENCOUNTER — Other Ambulatory Visit: Payer: Self-pay | Admitting: Physician Assistant

## 2022-03-06 DIAGNOSIS — M0579 Rheumatoid arthritis with rheumatoid factor of multiple sites without organ or systems involvement: Secondary | ICD-10-CM

## 2022-03-06 NOTE — Telephone Encounter (Signed)
Next Visit: 03/28/2022  Last Visit: 12/26/2021  Last Fill: 12/13/2021  DX: Rheumatoid arthritis involving multiple sites with positive rheumatoid factor   Current Dose per office note 12/26/2021: Enbrel 50 mg sq injections once weekly   Labs: 02/14/2022 CBC and CMP WNL.  CK WNL.    TB Gold: 02/14/2022 Neg    Okay to refill Enbrel?

## 2022-03-07 DIAGNOSIS — F410 Panic disorder [episodic paroxysmal anxiety] without agoraphobia: Secondary | ICD-10-CM | POA: Diagnosis not present

## 2022-03-12 ENCOUNTER — Ambulatory Visit: Payer: Federal, State, Local not specified - PPO | Admitting: Nurse Practitioner

## 2022-03-12 ENCOUNTER — Encounter: Payer: Self-pay | Admitting: Physician Assistant

## 2022-03-12 ENCOUNTER — Encounter: Payer: Self-pay | Admitting: Nurse Practitioner

## 2022-03-12 DIAGNOSIS — J849 Interstitial pulmonary disease, unspecified: Secondary | ICD-10-CM

## 2022-03-12 DIAGNOSIS — J479 Bronchiectasis, uncomplicated: Secondary | ICD-10-CM

## 2022-03-12 DIAGNOSIS — G4733 Obstructive sleep apnea (adult) (pediatric): Secondary | ICD-10-CM | POA: Diagnosis not present

## 2022-03-12 DIAGNOSIS — G4734 Idiopathic sleep related nonobstructive alveolar hypoventilation: Secondary | ICD-10-CM | POA: Diagnosis not present

## 2022-03-12 NOTE — Assessment & Plan Note (Signed)
UIP vs NSIP. Stable on HRCT from March 2023. Continue Trelegy and PRN albuterol.

## 2022-03-12 NOTE — Assessment & Plan Note (Signed)
Clinically stable. Continue 2.5 lpm bled through CPAP at night. No O2 requirement during the day currently. Sats stable in office today on room air. Goal >88-90%

## 2022-03-12 NOTE — Assessment & Plan Note (Addendum)
Clinically stable. Recent exacerbation in June requiring levaquin course; sputum cultures at the time were inadequate. She has recovered well without any recurrent flares requiring abx or prednisone. Continue mucociliary clearance therapies. Start vest therapy Twice daily for chest congestion/cough once received.   Patient Instructions  Continue Albuterol inhaler 2 puffs or 3 mL neb every 6 hours as needed for shortness of breath or wheezing. Notify if symptoms persist despite rescue inhaler/neb use.  Continue Trelegy 1 puff daily. Brush tongue and rinse mouth afterwards Continue mucinex 1200 mg Twice daily for chest congestion/cough Continue supplemental oxygen bled through CPAP every night  Continue hypertonic saline nebs Twice daily as needed for cough/congestion  Start vest therapy twice daily as needed for chest congestion/cough   Follow up in 3 months with Jill Harper or Jill Jatia Musa,NP. If symptoms do not improve or worsen, please contact office for sooner follow up or seek emergency care.

## 2022-03-12 NOTE — Progress Notes (Signed)
$'@Patient'o$  ID: Jill Harper, female    DOB: 1965-12-04, 56 y.o.   MRN: [id number]  Chief Complaint  Patient presents with   Follow-up    Patient has no complaints.     Referring provider: Binnie Rail, MD  HPI: 56 year old female, never smoker followed for ILD in the setting of seropositive RA and polymyositis, bronchiectasis, OSA on CPAP and nocturnal hypoxemia. She is a patient of Dr. Juanetta Gosling and last seen in office on 01/29/2022 by [facility] NP. Past medical history significant for migraines, MR, fibromuscular dysplasia, HTN, anxiety.  She is followed by rheumatology and is on Imuran and Enbrel.  TEST/EVENTS:  07/28/2019 PFTs: FVC 78, FEV1 88, ratio 89, TLC 71, DLCOcor 80.  Mild to moderate restrictive defect 10/23/2021 HRCT chest: There is a small hiatal hernia.  There are patchy areas of groundglass attenuation, septal thickening, small amount of subpleural reticulation, mild cylindrical bronchiectasis and peripheral bronchiolectasis.  There is a definitive craniocaudal gradient; unchanged when compared to previous.  No frank honeycombing is present.  Categorized as probable UIP; however, given stability, not entirely able to rule out NSIP  10/11/2021: OV with [name]. Breathing improved after starting on Trelegy. Still not entirely back to her baseline after having respiratory infection in November 2022. Minimal cough with clear phlegm. Using CPAP 7-15 cmH2O and 2.5 lpm O2 at night without difficulty. Ordered HRCT and 6MWT to further assess. Referred back to pulmonary rehab.   01/15/2022: Ok [place] with Khai Torbert NP for acute visit.  She has been having an increased and more congested cough over the last 5 days or so.  She had been at pulmonary rehab and they recommended that she come in to get checked out.  She also has felt a little more fatigued than normal and feels like her activity tolerance is not as good as it normally is.  Cough is worse at night and first thing in the morning.  Currently  nonproductive.  Mild increase in shortness of breath.  She has not noticed any wheezing.  Started on prednisone taper for bronchiectatic flare. Sent for hypertonic saline nebs; unable to use flutter valve d/t carotid artery disease. She continues on Trelegy daily and has used her albuterol a couple times. No concerns or complaints regarding CPAP.  01/22/2022: OV with [name]. Cough transitioned to productive with green phlegm. Sputum culture ordered. Treated with levaquin course. Previously unable to get hypertonic nebs. Continue PRN albuterol and mucinex. Continue Trelegy.   01/29/2022: OV with Keaisha Sublette NP for follow up after being treated for slow to resolve bronchiectatic exacerbation with levaquin course. She is unable to use flutter valve due to her carotid disease and how it makes her feel. Slowly resolving exacerbation. Clinically improved today. She completed levaquin course without difficulties. Sputum culture was inadequate. Given improvement, discussed we could hold off on re-collecting unless her cough becomes more productive or she begins to feel worse. Unable to use flutter valve and has a chronic waxing and waning productive cough >6 months; most recently requiring abx. Rx for hypertonic nebs and vest therapy sent to DME for her to have on hand to use as needed for chest congestion/cough. Continue mucinex, trelegy and PRN nebs.  03/12/2022: Today - follow up Patient presents today for follow up. She reports that she has been doing relatively well since we saw her last. She did have a bout last week with increased coughing but this resolved after a day. She attributes it to the rainy weather  that we had. No recent fevers or night sweats. Breathing is overall stable and unchanged from her baseline. She rarely uses albuterol inhaler or neb. Uses hypertonic saline nebs if she feels like she has chest congestion, and she does feel like these help. Doesn't have to do them often. She has yet to receive her  vest therapy but the supply company told her that they are working on the denial appeal and should have it to her soon. She continues on Trelegy daily. Wears CPAP more nights than not. Denies morning headaches or drowsy driving.   02/07/2022-03/08/2022: CPAP auto 7-15 cmH2O 27/30 days; 90% >4 hr; av use 12 hr 57 min Pressure median 9, 95th 12.4 Leaks median 0.3, 95th 20.3 AHI 2.6  Allergies  Allergen Reactions   Sulfonamide Derivatives     REACTION: hives    Immunization History  Administered Date(s) Administered   Influenza Split 04/10/2011, 06/18/2012, 05/06/2013   Influenza Whole 05/19/2008, 05/15/2009, 06/01/2010   Influenza,inj,Quad PF,6+ Mos 04/20/2014, 05/26/2015, 05/31/2016, 04/05/2017, 06/16/2018, 04/22/2019, 04/22/2020   Influenza-Unspecified 04/11/2021   PFIZER(Purple Top)SARS-COV-2 Vaccination 10/27/2019, 11/19/2019   Pneumococcal Conjugate-13 01/29/2017   Pneumococcal Polysaccharide-23 08/07/2007, 06/16/2018   Td 07/12/2009   Tdap 01/06/2020   Unspecified SARS-COV-2 Vaccination 10/27/2019, 11/19/2019, 04/04/2021    Past Medical History:  Diagnosis Date   Abnormal liver function test    Bronchiectasis        Dyspnea    Fibromuscular dysplasia (HCC)    Fibromyalgia    Heart murmur    ILD (interstitial lung disease) ([place])    Joint pain    Menorrhagia    MIGRAINE HEADACHE    MVP (mitral valve prolapse)    Osteoarthritis    Polymyositis (HCC)    Dr [name]; chronic MTX   Pulmonary fibrosis (HCC)    Rheumatoid arthritis ([place])    Sleep apnea    Vitamin B 12 deficiency    Vitamin D deficiency     Tobacco History: Social History   Tobacco Use  Smoking Status Never   Passive exposure: Never  Smokeless Tobacco Never  Tobacco Comments   Married, lives with spouse. 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 works at [name] [employer] in preparation  of commercial [place] [employer]   Outpatient Medications Prior to Visit  Medication Sig Dispense Refill   albuterol (PROVENTIL HFA) 108 (90 Base) MCG/ACT inhaler Inhale 2 puffs into the lungs 4 (four) times daily as needed. Reported on 09/03/2015 1 each 4   albuterol (PROVENTIL) (2.5 MG/3ML) 0.083% nebulizer solution Take 3 mLs (2.5 mg total) by nebulization every 6 (six) hours as needed for wheezing or shortness of breath. 120 vial 5   aspirin EC 81 MG tablet Take 81 mg by mouth daily.     atenolol (TENORMIN) 25 MG tablet TAKE 1/2 TABLET BY MOUTH DAILY AS NEEDED FOR MITRAL PROLAPSE 45 tablet 0   azaTHIOprine (IMURAN) 50 MG tablet Take 2 tablets by mouth daily. 60 tablet 2   benzonatate (TESSALON) 200 MG capsule Take 1 capsule (200 mg total) by mouth 3 (three) times daily as needed. 30 capsule 1   Cholecalciferol (VITAMIN D) 50 MCG (2000 UT) CAPS Take by mouth.     diclofenac Sodium (VOLTAREN) 1 % GEL APPLY 2 TO 4 GRAMS TO AFFECTED JOINT FOUR TIMES DAILY AS NEEDED 400 g 2   DULoxetine (CYMBALTA) 60 MG capsule Take 1 capsule (  60 mg total) by mouth daily. 90 capsule 1   EMGALITY 120 MG/ML SOAJ ADMINISTER 1 ML UNDER THE SKIN EVERY 30 DAYS 1 mL 5   ENBREL MINI 50 MG/ML SOCT INSERT 1 MINI CARTRIDGE INTO AUTOINJECTOR AND INJECT 50 MG UNDER THE SKIN EVERY 7 DAYS. 12 mL 0   Fluticasone-Umeclidin-Vilant (TRELEGY ELLIPTA) 100-62.5-25 MCG/ACT AEPB Inhale 1 puff into the lungs daily. 60 each 5   gabapentin (NEURONTIN) 100 MG capsule TAKE 3 CAPSULES(300 MG) BY MOUTH AT BEDTIME 90 capsule 5   guaiFENesin (MUCINEX) 600 MG 12 hr tablet Take 2 tablets (1,200 mg total) by mouth 2 (two) times daily as needed for cough or to loosen phlegm. (Patient taking differently: Take 1,200 mg by mouth 2 (two) times daily.)     metFORMIN (GLUCOPHAGE) 500 MG tablet TAKE 1 TABLET(500 MG) BY MOUTH TWICE DAILY WITH A MEAL 180 tablet 1   Multiple Vitamins-Minerals (MULTIVITAMIN GUMMIES ADULT PO) Take 2 each by mouth daily.      Rimegepant Sulfate (NURTEC) 75 MG TBDP Take 75 mg by mouth daily as needed. For migraines. Take as close to onset of migraine as possible. One daily maximum. 10 tablet 0   sodium chloride HYPERTONIC 3 % nebulizer solution Take by nebulization as needed for cough. 75 mL 12   sodium chloride HYPERTONIC 3 % nebulizer solution Take by nebulization as needed for other. 750 mL 12   telmisartan (MICARDIS) 20 MG tablet TAKE 1 TABLET(20 MG) BY MOUTH DAILY 90 tablet 1   eletriptan (RELPAX) 20 MG tablet TAKE 1 TABLET BY MOUTH AS NEEDED FOR MIGRAINE (Patient not taking: Reported on 02/15/2022) 10 tablet 11   levofloxacin (LEVAQUIN) 500 MG tablet Take 1 tablet (500 mg total) by mouth daily. (Patient not taking: Reported on 02/15/2022) 7 tablet 0   No facility-administered medications prior to visit.     Review of Systems:   Constitutional: No weight loss or gain, night sweats, fevers, chills, fatigue, lassitude HEENT: No headaches, difficulty swallowing, tooth/dental problems, or sore throat. No sneezing, itching, ear ache, nasal congestion, or post nasal drip CV:  No chest pain, orthopnea, PND, swelling in lower extremities, anasarca, dizziness, palpitations, syncope Resp: +shortness of breath with exertion (baseline); occasional cough (baseline); rare chest congestion (baseline). No excess mucus or change in color of mucus. No hemoptysis. No wheezing.  No chest wall deformity GI:  No heartburn, indigestion, abdominal pain, nausea, vomiting, diarrhea, change in bowel habits, loss of appetite, bloody stools.  Skin: No rash, lesions, ulcerations MSK:  No joint pain or swelling.  No decreased range of motion.  No back pain. Neuro: No dizziness or lightheadedness.  Psych: No depression or anxiety. Mood stable.     Physical Exam:  BP 130/60 (BP Location: Right Arm, Patient Position: Sitting, Cuff Size: Large)   Pulse 98 Comment: apical  Temp 99 F (37.2 C) (Oral)   Ht '5\' 8"'$  (1.727 m)   Wt 292 lb 12.8 oz  (132.8 kg)   SpO2 97%   BMI 44.52 kg/m   GEN: Pleasant, interactive, well-appearing; obese; in no acute distress. HEENT:  Normocephalic and atraumatic. PERRLA. Sclera white. Nasal turbinates pink, moist and patent bilaterally. No rhinorrhea present. Oropharynx pink and moist, without exudate or edema. No lesions, ulcerations, or postnasal drip.  NECK:  Supple w/ fair ROM. No JVD present. Normal carotid impulses w/o bruits. Thyroid symmetrical with no goiter or nodules palpated. No lymphadenopathy.   CV: RRR, no m/r/g, no peripheral edema. Pulses intact, +2 bilaterally. No  cyanosis, pallor or clubbing. PULMONARY:  Unlabored, regular breathing. Clear bilaterally A&P w/o wheeze/rales/rhonchi. No accessory muscle use. No dullness to percussion. GI: BS present and normoactive. Soft, non-tender to palpation. No organomegaly or masses detected. No CVA tenderness. MSK: No erythema, warmth or tenderness. Cap refil <2 sec all extrem. No deformities or joint swelling noted.  Neuro: A/Ox3. No focal deficits noted.   Skin: Warm, no lesions or rashe Psych: Normal affect and behavior. Judgement and thought content appropriate.     Lab Results:  CBC    Component Value Date/Time   WBC 8.2 02/14/2022 1504   RBC 4.67 02/14/2022 1504   HGB 13.3 02/14/2022 1504   HGB 14.2 10/12/2019 1432   HCT 40.5 02/14/2022 1504   HCT 41.9 10/12/2019 1432   PLT 296 02/14/2022 1504   PLT 292 10/12/2019 1432   MCV 86.7 02/14/2022 1504   MCV 86 10/12/2019 1432   MCH 28.5 02/14/2022 1504   MCHC 32.8 02/14/2022 1504   RDW 12.4 02/14/2022 1504   RDW 12.1 10/12/2019 1432   LYMPHSABS 1,755 02/14/2022 1504   LYMPHSABS 1.3 10/12/2019 1432   MONOABS 0.7 07/11/2021 0813   EOSABS 197 02/14/2022 1504   EOSABS 0.1 10/12/2019 1432   BASOSABS 33 02/14/2022 1504   BASOSABS 0.0 10/12/2019 1432    BMET    Component Value Date/Time   NA 140 02/14/2022 1504   NA 141 10/12/2019 1432   K 4.5 02/14/2022 1504   CL 107  02/14/2022 1504   CO2 26 02/14/2022 1504   GLUCOSE 98 02/14/2022 1504   BUN 11 02/14/2022 1504   BUN 11 10/12/2019 1432   CREATININE 0.71 02/14/2022 1504   CALCIUM 9.6 02/14/2022 1504   GFRNONAA 98 01/18/2021 1626   GFRAA 114 01/18/2021 1626    BNP No results found for: "BNP"   Imaging:  No results found.       Latest Ref Rng & Units 07/27/2021    3:59 PM 02/17/2020    4:03 PM 02/05/2019    3:59 PM 04/17/2018    2:54 PM 12/02/2015   11:56 AM  PFT Results  FVC-Pre L 2.55  2.42  2.58  2.74  3.40   FVC-Predicted Pre % 78  73  78  90  99   FVC-Post L 2.69  2.42  2.56   3.24   FVC-Predicted Post % 82  73  77   94   Pre FEV1/FVC % % 90  86  89  90  87   Post FEV1/FCV % % 89  90  86   92   FEV1-Pre L 2.29  2.09  2.30  2.46  2.97   FEV1-Predicted Pre % 88  80  87  101  108   FEV1-Post L 2.41  2.18  2.21   2.96   DLCO uncorrected ml/min/mmHg 18.89  18.58  19.43  20.26  19.92   DLCO UNC% % 80  78  81  76  65   DLCO corrected ml/min/mmHg 18.89  18.42  19.68  20.01  19.46   DLCO COR %Predicted % 80  77  82  75  64   DLVA Predicted % 120  125  120  98  79   TLC L 4.04  4.22  4.21   4.95   TLC % Predicted % 71  74  74   86   RV % Predicted % 66  74  69   76     Lab Results  Component Value Date   NITRICOXIDE 9 12/11/2017        Assessment & Plan:   Bronchiectasis ([place]) Clinically stable. Recent exacerbation in June requiring levaquin course; sputum cultures at the time were inadequate. She has recovered well without any recurrent flares requiring abx or prednisone. Continue mucociliary clearance therapies. Start vest therapy Twice daily for chest congestion/cough once received.   Patient Instructions  Continue Albuterol inhaler 2 puffs or 3 mL neb every 6 hours as needed for shortness of breath or wheezing. Notify if symptoms persist despite rescue inhaler/neb use.  Continue Trelegy 1 puff daily. Brush tongue and rinse mouth afterwards Continue mucinex 1200 mg Twice  daily for chest congestion/cough Continue supplemental oxygen bled through CPAP every night  Continue hypertonic saline nebs Twice daily as needed for cough/congestion  Start vest therapy twice daily as needed for chest congestion/cough   Follow up in 3 months with [name] or Katie Malayjah Otoole,NP. If symptoms do not improve or worsen, please contact office for sooner follow up or seek emergency care.    ILD (interstitial lung disease) (HCC) UIP vs NSIP. Stable on HRCT from March 2023. Continue Trelegy and PRN albuterol.   OSA (obstructive sleep apnea) Compliant with CPAP and continues to receive good benefit from use. Continue nightly CPAP auto 7-15 cmH2O.   Nocturnal hypoxia Clinically stable. Continue 2.5 lpm bled through CPAP at night. No O2 requirement during the day currently. Sats stable in office today on room air. Goal >88-90%     I spent 28 minutes of dedicated to the care of this patient on the date of this encounter to include pre-visit review of records, face-to-face time with the patient discussing conditions above, post visit ordering of testing, clinical documentation with the electronic health record, making appropriate referrals as documented, and communicating necessary findings to members of the patients care team.  Clayton Bibles, NP 03/12/2022  Pt aware and understands NP's role.

## 2022-03-12 NOTE — Progress Notes (Signed)
   New Patient   Subjective  Jill Harper is a 56 y.o. female who presents for the following: Annual Exam (Patient has RA and Dr Jena Gauss wanted her checked for skin cancer because she is on Enbrel ).   The following portions of the chart were reviewed this encounter and updated as appropriate:  Tobacco  Allergies  Meds  Problems  Med Hx  Surg Hx  Fam Hx      Objective  Well appearing patient in no apparent distress; mood and affect are within normal limits.  A full examination was performed including scalp, head, eyes, ears, nose, lips, neck, chest, axillae, abdomen, back, buttocks, bilateral upper extremities, bilateral lower extremities, hands, feet, fingers, toes, fingernails, and toenails. All findings within normal limits unless otherwise noted below.  Full body skin examination -No atypical nevi or signs of NMSC noted at the time of the visit.    Assessment & Plan  Encounter for screening for malignant neoplasm of skin  Yearly skin examination      I, Yaremi Stahlman, PA-C, have reviewed all documentation's for this visit.  The documentation on 03/12/22 for the exam, diagnosis, procedures and orders are all accurate and complete.

## 2022-03-12 NOTE — Patient Instructions (Addendum)
Continue Albuterol inhaler 2 puffs or 3 mL neb every 6 hours as needed for shortness of breath or wheezing. Notify if symptoms persist despite rescue inhaler/neb use.  Continue Trelegy 1 puff daily. Brush tongue and rinse mouth afterwards Continue mucinex 1200 mg Twice daily for chest congestion/cough Continue supplemental oxygen bled through CPAP every night  Continue hypertonic saline nebs Twice daily as needed for cough/congestion  Start vest therapy twice daily as needed for chest congestion/cough   Follow up in 3 months with [name] or Katie Sarea Fyfe,NP. If symptoms do not improve or worsen, please contact office for sooner follow up or seek emergency care.

## 2022-03-12 NOTE — Assessment & Plan Note (Signed)
Compliant with CPAP and continues to receive good benefit from use. Continue nightly CPAP auto 7-15 cmH2O.

## 2022-03-12 NOTE — Progress Notes (Signed)
Reviewed and agree with assessment/plan.   Chesley Mires, MD [facility] Pulmonary/Critical Care 03/12/2022, 4:56 PM Pager:  [phone]

## 2022-03-14 ENCOUNTER — Encounter (INDEPENDENT_AMBULATORY_CARE_PROVIDER_SITE_OTHER): Payer: Self-pay

## 2022-03-14 DIAGNOSIS — F410 Panic disorder [episodic paroxysmal anxiety] without agoraphobia: Secondary | ICD-10-CM | POA: Diagnosis not present

## 2022-03-15 NOTE — Progress Notes (Signed)
Office Visit Note  Patient: Jill Harper             Date of Birth: 05/22/1966           MRN: [id number]             PCP: Binnie Rail, MD Referring: Binnie Rail, MD Visit Date: 03/28/2022 Occupation: @GUAROCC @  Subjective:  Mediation monitoring   History of Present Illness: Jill Harper is a 56 y.o. female with history of seropositive rheumatoid arthritis, polymyositis, and ILD.  She remains on Enbrel 50 mg sq injections once weekly (02/18/20-1st injection) and Imuran 100 mg by mouth daily.  She is tolerating combination therapy without any side effects or injection site reactions.  She has not missed any doses of Enbrel or Imuran recently.  Patient reports that on Monday she was more active than usual and feels that she "overdid it."  She states that she started to have increased joint pain and stiffness and took methocarbamol and Tylenol PM Monday night.  She states that her symptoms had improved Tuesday morning.  She states she continues to experience intermittent myalgias and muscle tenderness due to fibromyalgia.  She denies any polymyositis flares recently.  She denies any joint swelling at this time.  Patient reports that she has completed pulmonary rehab.  She continues to follow-up at [facility] pulmonary on a regular basis as advised.  Her most recent office visit was on 03/12/2022.  Patient reports that she had a recent dermatology appointment which was unremarkable and was advised to follow-up on a yearly basis.  She will be having to establish care with a new dermatologist since her other dermatology practice has closed.     Activities of Daily Living:  Patient reports morning stiffness for 5 minutes.   Patient Denies nocturnal pain.  Difficulty dressing/grooming: Reports Difficulty climbing stairs: Reports Difficulty getting out of chair: Denies Difficulty using hands for taps, buttons, cutlery, and/or writing: Reports  Review of Systems  Constitutional:  Positive for  fatigue.  HENT:  Positive for mouth dryness. Negative for mouth sores.   Eyes:  Positive for dryness.  Respiratory:  Positive for shortness of breath.   Cardiovascular:  Positive for chest pain. Negative for palpitations.  Gastrointestinal:  Negative for blood in stool, constipation and diarrhea.  Endocrine: Negative for increased urination.  Genitourinary:  Negative for involuntary urination.  Musculoskeletal:  Positive for joint pain, joint pain, joint swelling, myalgias, muscle weakness, morning stiffness, muscle tenderness and myalgias. Negative for gait problem.  Skin:  Positive for sensitivity to sunlight. Negative for color change, rash and hair loss.  Allergic/Immunologic: Positive for susceptible to infections.  Neurological:  Positive for dizziness and headaches.  Hematological:  Negative for swollen glands.  Psychiatric/Behavioral:  Positive for sleep disturbance. Negative for depressed mood. The patient is nervous/anxious.     PMFS History:  Patient Active Problem List   Diagnosis Date Noted   Nocturnal hypoxia 01/16/2022   Pharyngitis, acute 11/22/2021   Anxiety 09/06/2021   Tingling 06/28/2021   Occipital neuralgia of left side 01/06/2020   Bilateral carpal tunnel syndrome 07/21/2019   Costochondritis 03/16/2019   Rib pain on right side 03/16/2019   Prediabetes 11/27/2018   Fibromuscular dysplasia ([place]) 10/17/2018   Hypertension 10/17/2018   Bronchiectasis with (acute) exacerbation ([place]) 10/08/2018   OSA (obstructive sleep apnea) 08/20/2018   Rheumatoid arthritis ([place]) 04/17/2018   ILD (interstitial lung disease) ([place]) 04/17/2018   Dyspnea 04/01/2018   Bronchiectasis ([place]) 09/02/2017  LVH (left ventricular hypertrophy), mild 06/02/2017   Mitral regurgitation 05/22/2017   Jo-1 antibody positive 01/29/2017   Primary osteoarthritis of both feet 01/29/2017   Primary osteoarthritis of both hands 01/29/2017   Left shoulder tendonitis 01/17/2017   Vitamin D  deficiency 04/26/2016   Cough 07/18/2015   MVP (mitral valve prolapse)    Erythema nodosum    INCONTINENCE, URGE 05/26/2010   Migraine headache 02/15/2010   Fibromyalgia 06/20/2009   Polymyositis ([place]) 02/11/2008   BRONCHIECTASIS 02/10/2008    Past Medical History:  Diagnosis Date   Abnormal liver function test    Bronchiectasis        Dyspnea    Fibromuscular dysplasia (HCC)    Fibromyalgia    Heart murmur    ILD (interstitial lung disease) ([place])    Joint pain    Menorrhagia    MIGRAINE HEADACHE    MVP (mitral valve prolapse)    Osteoarthritis    Polymyositis ([place])    Dr [name]; chronic MTX   Pulmonary fibrosis ([place])    Rheumatoid arthritis ([place])    Sleep apnea    Vitamin B 12 deficiency    Vitamin D deficiency     Family History  Problem Relation Age of Onset   Diabetes Mother    Fibromyalgia Mother    Ulcers Mother    Heart failure Mother    Thyroid disease Mother    Obesity Mother    Multiple myeloma Father    Hypertension Father    Stroke Father    Lupus Sister    Other Sister        abdominal adhesions resulting in bowel obstruction   Migraines Sister    Heart disease Brother        bypass surgery   Rheum arthritis Sister    Multiple myeloma Sister    Hypertension Sister    Heart attack Sister    Diabetes Sister    Hypertension Sister    Rheum arthritis Sister    Diabetes Sister    Diabetes Brother    Headache Other        siblings with headaches but not diagnosed as migraines   Past Surgical History:  Procedure Laterality Date   ablation uterine  2010   CARDIAC CATHETERIZATION  2002   normal   DILATION AND CURETTAGE OF UTERUS  08-31-08   Dr [name]   IR [place] SEL COM CAROTID INNOMINATE BILAT MOD SED  09/18/2018   IR ANGIO VERTEBRAL SEL VERTEBRAL BILAT MOD SED  09/18/2018   IR US GUIDE VASC ACCESS RIGHT  09/18/2018   Social History   Social History Narrative   Exercise: trying to walk - limited by fatigue    Lives at home with husband    Right handed   Caffeine: none    Immunization History  Administered Date(s) Administered   Influenza Split 04/10/2011, 06/18/2012, 05/06/2013   Influenza Whole 05/19/2008, 05/15/2009, 06/01/2010   Influenza,inj,Quad PF,6+ Mos 04/20/2014, 05/26/2015, 05/31/2016, 04/05/2017, 06/16/2018, 04/22/2019, 04/22/2020   Influenza-Unspecified 04/11/2021   PFIZER(Purple Top)SARS-COV-2 Vaccination 10/27/2019, 11/19/2019   Pneumococcal Conjugate-13 01/29/2017   Pneumococcal Polysaccharide-23 08/07/2007, 06/16/2018   Td 07/12/2009   Tdap 01/06/2020   Unspecified SARS-COV-2 Vaccination 10/27/2019, 11/19/2019, 04/04/2021     Objective: Vital Signs: BP 122/81 (BP Location: Left Arm, Patient Position: Sitting, Cuff Size: Large)   Pulse 85   Resp 14   Ht 5' 8"  (1.727 m)   Wt 293 lb 9.6 oz (133.2 kg)  BMI 44.64 kg/m    Physical Exam Vitals and nursing note reviewed.  Constitutional:      Appearance: She is well-developed.  HENT:     Head: Normocephalic and atraumatic.  Eyes:     Conjunctiva/sclera: Conjunctivae normal.  Cardiovascular:     Rate and Rhythm: Normal rate and regular rhythm.     Heart sounds: Murmur heard.  Pulmonary:     Effort: Pulmonary effort is normal.     Breath sounds: Normal breath sounds.  Abdominal:     General: Bowel sounds are normal.     Palpations: Abdomen is soft.  Musculoskeletal:     Cervical back: Normal range of motion.  Skin:    General: Skin is warm and dry.     Capillary Refill: Capillary refill takes less than 2 seconds.  Neurological:     Mental Status: She is alert and oriented to person, place, and time.  Psychiatric:        Behavior: Behavior normal.      Musculoskeletal Exam: Generalized hyperalgesia and positive tender points on exam.  C-spine, thoracic spine, and lumbar spine have good ROM.  Shoulder joints, elbow joints, wrist joints, MCPs, PIPs, and DIPs good ROM with no synovitis. PIP and DIP thickening.   Complete fist formation noted.  Hip joints have good range of motion with no groin pain.  Knee joints have good range of motion with no warmth or effusion.  Ankle joints have good range of motion with no joint tenderness or synovitis.  CDAI Exam: CDAI Score: 0.8  Patient Global: 4 mm; Provider Global: 4 mm Swollen: 0 ; Tender: 0  Joint Exam 03/28/2022   No joint exam has been documented for this visit   There is currently no information documented on the homunculus. Go to the Rheumatology activity and complete the homunculus joint exam.  Investigation: No additional findings.  Imaging: No results found.  Recent Labs: Lab Results  Component Value Date   WBC 8.2 02/14/2022   HGB 13.3 02/14/2022   PLT 296 02/14/2022   NA 140 02/14/2022   K 4.5 02/14/2022   CL 107 02/14/2022   CO2 26 02/14/2022   GLUCOSE 98 02/14/2022   BUN 11 02/14/2022   CREATININE 0.71 02/14/2022   BILITOT 0.4 02/14/2022   ALKPHOS 60 07/11/2021   AST 18 02/14/2022   ALT 15 02/14/2022   PROT 7.3 02/14/2022   ALBUMIN 4.1 07/11/2021   CALCIUM 9.6 02/14/2022   GFRAA 114 01/18/2021   QFTBGOLDPLUS NEGATIVE 02/14/2022    Speciality Comments: Prior therapy includes: MTX (d/c due to ILD progresssion)  Procedures:  No procedures performed Allergies: Sulfonamide derivatives  03/12/22 pulm-stable High resolution chest CT on 10/23/21 Pulm rehab     Assessment / Plan:     Visit Diagnoses: Rheumatoid arthritis involving multiple sites with positive rheumatoid factor (HCC) - +RF, +CCP: She has no synovitis on examination today.  She continues to experience intermittent arthralgias, myalgias, and joint stiffness.  Overall her rheumatoid arthritis remains well controlled on Enbrel 50 mg subcu injections once weekly and Imuran 100 mg daily.  She is tolerating combination therapy without any side effects and has not missed any doses recently.  She has not had any recent or recurrent infections.  Most of her discomfort  seems to be due to underlying osteoarthritis and fibromyalgia overlap.  Her rheumatoid arthritis remains well controlled on the current treatment regimen so no medication changes will be made at this time.  She was advised to  notify us if she develops signs or symptoms of a flare.  She will follow-up in the office in 3 months or sooner if needed.  Polymyositis ([place]) - Dx by Dr. Charlestine Night 2007, CK 801, Jo-1 positive: No signs or symptoms of a flare.  She is clinically doing well taking imuran 100 mg daily.  No muscular weakness noted on examination today. CK within normal limits on 02/14/2022 and since 03/17/2020. No medication changes will be made at this time.  She was advised to notify us if she develops signs or symptoms of a flare.  High risk medication use - Enbrel 50 mg sq injections once weekly (02/18/20-1st injection) and Imuran 100 mg by mouth daily. d/c MTX due to progressioin of ILD.   CBC and CMP within normal limits on 02/14/2022.  She will be due to update lab work in October and every 3 months to monitor for drug toxicity.  Standing orders for CBC and CMP remain in place. TB Gold negative on 02/14/2022 and will continue to be monitored yearly. No recent or recurrent infections. Discussed the importance of holding Enbrel and Imuran if she develops signs or symptoms of an infection and to resume once the infection is completely cleared. Discussed the importance of yearly skin examinations by dermatology.  She had a routine derm appointment in 2023.  She will need to reestablish care with a new dermatologist since her current derm office has closed.   Jo-1 antibody positive  ILD (interstitial lung disease) (HCC) - Followed by [name], NP at [place].  Most recent office visit on 03/12/2022- office visit note was reviewed today in the office.  UIP vs. NSIP. Stable on HRCT from March 2023.  Completed pulmonary rehab.  PFTs will be updated on yearly basis along with HRCT.    Bronchiectasis without complication ([place]) - Followed by [facility]-[name].  Clinically stable.  Evaluated at [facility] pulmonary on 03/12/2022.  Patient has completed pulmonary rehab.  Bilateral carpal tunnel syndrome: Asymptomatic at this time.   Primary osteoarthritis of both hands: She has PIP and DIP thickening consistent with osteoarthritis.  No inflammation noted on examination. Complete fist formation bilaterally.   Primary osteoarthritis of both feet: She is not experiencing any increased discomfort in her feet currently.  No synovitis of ankle joints noted.  She is wearing proper fitting shoes.   Fibromyalgia: She has generalized hyperalgesia and positive tender points on examination.  She continues to experience intermittent myalgias and muscle tenderness consistent with fibromyalgia.  Her symptoms are typically exacerbated by overuse activities.  Discussed the importance of regular exercise and good sleep hygiene.  She was strongly encouraged to stretch on a daily basis.  She remains on Cymbalta 60 mg daily and gabapentin 300 mg at bedtime.  She takes methocarbamol 500 mg 1 tablet daily as needed during flares.  Trapezius muscle spasm: She has trapezius muscle tension and tenderness bilaterally.  She takes methocarbamol 500 mg 1 tablet daily as needed for muscle spasms.  Other fatigue: Intermittent. Stable.  Improved with pulmonary rehab.  Discussed the importance of regular exercise and good sleep hygiene.   Raynaud's syndrome without gangrene: Not currently active.  No digital ulcerations or signs of gangrene.   Fibromuscular dysplasia [facility]): Multifocal fibromuscular dysplasia of the bilateral internal carotid arteries-confirmed by catheter-based angiography-Followed by Dr. Maudie Mercury.  Reviewed office visit note from 03/27/2022 today in the office. CT angiography of the chest abdomen and pelvis were updated yesterday on 03/27/2022-no findings consistent with FMD.  Planning to have  updated CTA of head and neck per recommendations of Dr. Maudie Mercury.  Erythema nodosum - No recurrence  Other medical conditions are listed as follows:   History of migraine - Followed by Dr. Jaynee Eagles. Advised to avoid triptan medications due to history of FMD.  Planning to have an updated CTA of head and neck per recommendations of Dr. Maudie Mercury.  History of mitral valve prolapse: Murmur heard today. Followed by Dr. Maudie Mercury.   Vitamin D deficiency  Orders: No orders of the defined types were placed in this encounter.  No orders of the defined types were placed in this encounter.   Follow-Up Instructions: Return in about 3 months (around 06/28/2022) for Rheumatoid arthritis, Polymyositis, ILD.   Ofilia Neas, PA-C  Note - This record has been created using Dragon software.  Chart creation errors have been sought, but may not always  have been located. Such creation errors do not reflect on  the standard of medical care.

## 2022-03-16 ENCOUNTER — Other Ambulatory Visit: Payer: Self-pay | Admitting: Internal Medicine

## 2022-03-16 DIAGNOSIS — R7303 Prediabetes: Secondary | ICD-10-CM

## 2022-03-20 ENCOUNTER — Other Ambulatory Visit: Payer: Self-pay | Admitting: Adult Health

## 2022-03-21 DIAGNOSIS — F410 Panic disorder [episodic paroxysmal anxiety] without agoraphobia: Secondary | ICD-10-CM | POA: Diagnosis not present

## 2022-03-27 DIAGNOSIS — R42 Dizziness and giddiness: Secondary | ICD-10-CM | POA: Diagnosis not present

## 2022-03-27 DIAGNOSIS — I773 Arterial fibromuscular dysplasia: Secondary | ICD-10-CM | POA: Diagnosis not present

## 2022-03-28 ENCOUNTER — Ambulatory Visit: Payer: Federal, State, Local not specified - PPO | Attending: Physician Assistant | Admitting: Physician Assistant

## 2022-03-28 ENCOUNTER — Encounter: Payer: Self-pay | Admitting: Physician Assistant

## 2022-03-28 VITALS — BP 122/81 | HR 85 | Resp 14 | Ht 68.0 in | Wt 293.6 lb

## 2022-03-28 DIAGNOSIS — I7300 Raynaud's syndrome without gangrene: Secondary | ICD-10-CM

## 2022-03-28 DIAGNOSIS — R768 Other specified abnormal immunological findings in serum: Secondary | ICD-10-CM | POA: Diagnosis not present

## 2022-03-28 DIAGNOSIS — R5383 Other fatigue: Secondary | ICD-10-CM

## 2022-03-28 DIAGNOSIS — Z79899 Other long term (current) drug therapy: Secondary | ICD-10-CM

## 2022-03-28 DIAGNOSIS — M0579 Rheumatoid arthritis with rheumatoid factor of multiple sites without organ or systems involvement: Secondary | ICD-10-CM | POA: Diagnosis not present

## 2022-03-28 DIAGNOSIS — M3320 Polymyositis, organ involvement unspecified: Secondary | ICD-10-CM | POA: Diagnosis not present

## 2022-03-28 DIAGNOSIS — M19072 Primary osteoarthritis, left ankle and foot: Secondary | ICD-10-CM

## 2022-03-28 DIAGNOSIS — L52 Erythema nodosum: Secondary | ICD-10-CM

## 2022-03-28 DIAGNOSIS — E559 Vitamin D deficiency, unspecified: Secondary | ICD-10-CM

## 2022-03-28 DIAGNOSIS — Z8679 Personal history of other diseases of the circulatory system: Secondary | ICD-10-CM

## 2022-03-28 DIAGNOSIS — M19071 Primary osteoarthritis, right ankle and foot: Secondary | ICD-10-CM

## 2022-03-28 DIAGNOSIS — F410 Panic disorder [episodic paroxysmal anxiety] without agoraphobia: Secondary | ICD-10-CM | POA: Diagnosis not present

## 2022-03-28 DIAGNOSIS — J479 Bronchiectasis, uncomplicated: Secondary | ICD-10-CM

## 2022-03-28 DIAGNOSIS — J849 Interstitial pulmonary disease, unspecified: Secondary | ICD-10-CM

## 2022-03-28 DIAGNOSIS — G5603 Carpal tunnel syndrome, bilateral upper limbs: Secondary | ICD-10-CM

## 2022-03-28 DIAGNOSIS — M797 Fibromyalgia: Secondary | ICD-10-CM

## 2022-03-28 DIAGNOSIS — M19041 Primary osteoarthritis, right hand: Secondary | ICD-10-CM

## 2022-03-28 DIAGNOSIS — I773 Arterial fibromuscular dysplasia: Secondary | ICD-10-CM

## 2022-03-28 DIAGNOSIS — Z8669 Personal history of other diseases of the nervous system and sense organs: Secondary | ICD-10-CM

## 2022-03-28 DIAGNOSIS — M19042 Primary osteoarthritis, left hand: Secondary | ICD-10-CM

## 2022-03-28 DIAGNOSIS — M62838 Other muscle spasm: Secondary | ICD-10-CM

## 2022-03-28 NOTE — Patient Instructions (Signed)
Standing Labs We placed an order today for your standing lab work.   Please have your standing labs drawn in October and every 3 months   If possible, please have your labs drawn 2 weeks prior to your appointment so that the provider can discuss your results at your appointment.  Please note that you may see your imaging and lab results in [facility] before we have reviewed them. We may be awaiting multiple results to interpret others before contacting you. Please allow our office up to 72 hours to thoroughly review all of the results before contacting the office for clarification of your results.  We currently have open lab daily: Monday through Thursday from 1:30 PM-4:30 PM and Friday from 1:30 PM- 4:00 PM If possible, please come for your lab work on Monday, Thursday or Friday afternoons, as you may experience shorter wait times.   Effective June 06, 2022 the new lab hours will change to: Monday through Thursday from 1:30 PM-5:00 PM and Friday from 8:30 AM-12:00 PM If possible, please come for your lab work on Monday and Thursday afternoons, as you may experience shorter wait times.  Please be advised, all patients with office appointments requiring lab work will take precedent over walk-in lab work.    The office is located at [address], [place], [place], [place] [address] No appointment is necessary.   Labs are drawn by [facility]. Please bring your co-pay at the time of your lab draw.  You may receive a bill from [facility] for your lab work.  Please note if you are on Hydroxychloroquine and and an order has been placed for a Hydroxychloroquine level, you will need to have it drawn 4 hours or more after your last dose.  If you wish to have your labs drawn at another location, please call the office 24 hours in advance to send orders.  If you have any questions regarding directions or hours of operation,  please call [phone].   As a reminder, please drink plenty of water prior  to coming for your lab work. Thanks!

## 2022-04-01 NOTE — Progress Notes (Unsigned)
Subjective:    Patient ID: Jill Harper, female    DOB: 1966-01-13, 56 y.o.   MRN: [id number]     HPI Jill Harper is here for follow up of her chronic medical problems, including anxiety, htn  She has been having vertigo lasting for days, associated with headache and tingling on both sides of face.  She had some neck pain R > L, which started after doing her hair and doing a lot of leaning and bending.  She saw Dr Maudie Mercury the FMD specialist in [place].  She recommended a CTA of her head and neck to r/o dissection.    First time she had the symptoms was in June - lasted a few days.  It occurred while she was doing hair.  Second time -this episode was more severe - lasted longer - occurred when sitting up.  When vertigo settled - had headache and tingling.  Symptoms then went away.  She has not had any recent symptoms, but with her history of FMD there is concern for possible dissection.  Therapy going well with PAP but anxiety worse.  Sleep is not great-likely related to increased anxiety.  She wondered about increasing Cymbalta.  ?  Murmur louder-couple of her other specialists have noted that her murmur sounds a little louder than she was wondering if that is a concern  Still pursing federal disability -   Medications and allergies reviewed with patient and updated if appropriate.  Current Outpatient Medications on File Prior to Visit  Medication Sig Dispense Refill   albuterol (PROVENTIL HFA) 108 (90 Base) MCG/ACT inhaler Inhale 2 puffs into the lungs 4 (four) times daily as needed. Reported on 09/03/2015 1 each 4   albuterol (PROVENTIL) (2.5 MG/3ML) 0.083% nebulizer solution Take 3 mLs (2.5 mg total) by nebulization every 6 (six) hours as needed for wheezing or shortness of breath. 120 vial 5   aspirin EC 81 MG tablet Take 81 mg by mouth daily.     azaTHIOprine (IMURAN) 50 MG tablet Take 2 tablets by mouth daily. 60 tablet 2   benzonatate (TESSALON) 200 MG capsule Take 1 capsule (200 mg  total) by mouth 3 (three) times daily as needed. 30 capsule 1   Cholecalciferol (VITAMIN D) 50 MCG (2000 UT) CAPS Take by mouth.     diclofenac Sodium (VOLTAREN) 1 % GEL APPLY 2 TO 4 GRAMS TO AFFECTED JOINT FOUR TIMES DAILY AS NEEDED 400 g 2   DULoxetine (CYMBALTA) 60 MG capsule Take 1 capsule (60 mg total) by mouth daily. 90 capsule 1   eletriptan (RELPAX) 20 MG tablet TAKE 1 TABLET BY MOUTH AS NEEDED FOR MIGRAINE 10 tablet 11   EMGALITY 120 MG/ML SOAJ ADMINISTER 1 ML UNDER THE SKIN EVERY 30 DAYS 1 mL 5   ENBREL MINI 50 MG/ML SOCT INSERT 1 MINI CARTRIDGE INTO AUTOINJECTOR AND INJECT 50 MG UNDER THE SKIN EVERY 7 DAYS. 12 mL 0   Fluticasone-Umeclidin-Vilant (TRELEGY ELLIPTA) 100-62.5-25 MCG/ACT AEPB Inhale 1 puff into the lungs daily. 60 each 5   gabapentin (NEURONTIN) 100 MG capsule TAKE 3 CAPSULES(300 MG) BY MOUTH AT BEDTIME 90 capsule 5   guaiFENesin (MUCINEX) 600 MG 12 hr tablet Take 2 tablets (1,200 mg total) by mouth 2 (two) times daily as needed for cough or to loosen phlegm. (Patient taking differently: Take 1,200 mg by mouth 2 (two) times daily.)     metFORMIN (GLUCOPHAGE) 500 MG tablet TAKE 1 TABLET(500 MG) BY MOUTH TWICE DAILY WITH A  MEAL 180 tablet 1   methocarbamol (ROBAXIN) 500 MG tablet Take by mouth as needed.     Multiple Vitamins-Minerals (MULTIVITAMIN GUMMIES ADULT PO) Take 2 each by mouth daily.     NURTEC 75 MG TBDP DISSOLVE ONE TABLET BY MOUTH DAILY AS NEEDED FOR MIGRAINES. TAKE AS CLOSE TO ONSET OF MIGRAINE AS POSSIBLE. 1 TABLET DAILY MAXIMUM. 10 tablet 0   sodium chloride HYPERTONIC 3 % nebulizer solution Take by nebulization as needed for cough. 75 mL 12   sodium chloride HYPERTONIC 3 % nebulizer solution Take by nebulization as needed for other. 750 mL 12   telmisartan (MICARDIS) 20 MG tablet TAKE 1 TABLET(20 MG) BY MOUTH DAILY 90 tablet 1   No current facility-administered medications on file prior to visit.     Review of Systems  Constitutional:  Negative for fever.   Respiratory:  Positive for cough and shortness of breath. Negative for wheezing.   Cardiovascular:  Positive for palpitations. Negative for chest pain and leg swelling.  Neurological:  Positive for dizziness (none recently) and headaches (intermittent).       Objective:   Vitals:   04/02/22 1047 04/02/22 1055  BP: 120/78 134/78  Pulse: 81   Temp: 98.1 F (36.7 C)   SpO2: 98%    BP Readings from Last 3 Encounters:  04/02/22 134/78  03/28/22 122/81  03/12/22 130/60   Wt Readings from Last 3 Encounters:  04/02/22 291 lb (132 kg)  03/28/22 293 lb 9.6 oz (133.2 kg)  03/12/22 292 lb 12.8 oz (132.8 kg)   Body mass index is 44.25 kg/m.    Physical Exam Constitutional:      General: She is not in acute distress.    Appearance: Normal appearance.  HENT:     Head: Normocephalic and atraumatic.  Eyes:     Conjunctiva/sclera: Conjunctivae normal.  Cardiovascular:     Rate and Rhythm: Normal rate and regular rhythm.     Heart sounds: Murmur (2/6 systolic) heard.  Pulmonary:     Effort: Pulmonary effort is normal. No respiratory distress.     Breath sounds: Normal breath sounds. No wheezing.  Musculoskeletal:     Cervical back: Neck supple.     Right lower leg: No edema.     Left lower leg: No edema.  Lymphadenopathy:     Cervical: No cervical adenopathy.  Skin:    General: Skin is warm and dry.     Findings: No rash.  Neurological:     Mental Status: She is alert. Mental status is at baseline.  Psychiatric:        Mood and Affect: Mood normal.        Behavior: Behavior normal.        Lab Results  Component Value Date   WBC 8.2 02/14/2022   HGB 13.3 02/14/2022   HCT 40.5 02/14/2022   PLT 296 02/14/2022   GLUCOSE 98 02/14/2022   CHOL 129 07/11/2021   TRIG 78.0 07/11/2021   HDL 39.60 07/11/2021   LDLCALC 74 07/11/2021   ALT 15 02/14/2022   AST 18 02/14/2022   NA 140 02/14/2022   K 4.5 02/14/2022   CL 107 02/14/2022   CREATININE 0.71 02/14/2022   BUN 11  02/14/2022   CO2 26 02/14/2022   TSH 6.06 (H) 07/11/2021   INR 1.03 09/18/2018   HGBA1C 5.9 07/11/2021     Assessment & Plan:    See Problem List for Assessment and Plan of chronic medical problems.

## 2022-04-01 NOTE — Patient Instructions (Signed)
     Blood work was ordered.     Medications changes include :      Your prescription(s) have been sent to your pharmacy.    Ct was ordered - someone will call you to schedule this.     No follow-ups on file.

## 2022-04-02 ENCOUNTER — Ambulatory Visit: Payer: Federal, State, Local not specified - PPO | Admitting: Internal Medicine

## 2022-04-02 ENCOUNTER — Encounter: Payer: Self-pay | Admitting: Internal Medicine

## 2022-04-02 VITALS — BP 134/78 | HR 81 | Temp 98.1°F | Ht 68.0 in | Wt 291.0 lb

## 2022-04-02 DIAGNOSIS — R002 Palpitations: Secondary | ICD-10-CM | POA: Insufficient documentation

## 2022-04-02 DIAGNOSIS — I773 Arterial fibromuscular dysplasia: Secondary | ICD-10-CM | POA: Diagnosis not present

## 2022-04-02 DIAGNOSIS — R519 Headache, unspecified: Secondary | ICD-10-CM

## 2022-04-02 DIAGNOSIS — I340 Nonrheumatic mitral (valve) insufficiency: Secondary | ICD-10-CM

## 2022-04-02 DIAGNOSIS — R42 Dizziness and giddiness: Secondary | ICD-10-CM

## 2022-04-02 DIAGNOSIS — F419 Anxiety disorder, unspecified: Secondary | ICD-10-CM | POA: Diagnosis not present

## 2022-04-02 DIAGNOSIS — I10 Essential (primary) hypertension: Secondary | ICD-10-CM | POA: Diagnosis not present

## 2022-04-02 MED ORDER — DULOXETINE HCL 30 MG PO CPEP: 30.0000 mg | ORAL_CAPSULE | Freq: Every day | ORAL | 3 refills | Status: DC

## 2022-04-02 MED ORDER — ATENOLOL 25 MG PO TABS: 12.5000 mg | ORAL_TABLET | Freq: Every day | ORAL | 2 refills | Status: DC

## 2022-04-02 NOTE — Assessment & Plan Note (Signed)
Chronic Following with Dr. Maudie Mercury in [place] Had recent imaging Has had episodes of vertigo associated with headache, tingling on the side of her face-concern for possible dissection given history of FMD Dr. Maudie Mercury recommended CT angio of head and neck and it would be easier to do that locally so I have ordered that today She will let me know if her symptoms recur/worsen

## 2022-04-02 NOTE — Assessment & Plan Note (Addendum)
Chronic Intermittent Continue atenolol 12.5 mg daily now for blood pressure and for palpitations associated with mitral valve prolapse

## 2022-04-02 NOTE — Assessment & Plan Note (Signed)
Chronic Not ideally controlled because she is undergoing therapy and that has brought things up but it made her anxiety worse which is affecting her sleep We will try increasing Cymbalta to 90 mg daily to see if that helps She will continue therapy

## 2022-04-02 NOTE — Assessment & Plan Note (Addendum)
Chronic Blood pressure well controlled CMP Continue telmisartan 20 mg daily, atenolol 12.5 mg daily which she did not take today We will titrate atenolol as needed

## 2022-04-02 NOTE — Assessment & Plan Note (Signed)
New She has had episodes of vertigo with tingling on both sides of her face, neck pain and headaches Because of her fibromuscular dysplasia there is a concern for your dissection CTA head and neck ordered

## 2022-04-02 NOTE — Assessment & Plan Note (Signed)
Chronic Echocardiogram 2022 showed trivial MR Some specialist have listened to her and felt like the murmur was a little bit louder-not sure if that is true or not, but will go ahead and get echo to reevaluate

## 2022-04-04 DIAGNOSIS — F410 Panic disorder [episodic paroxysmal anxiety] without agoraphobia: Secondary | ICD-10-CM | POA: Diagnosis not present

## 2022-04-11 DIAGNOSIS — F410 Panic disorder [episodic paroxysmal anxiety] without agoraphobia: Secondary | ICD-10-CM | POA: Diagnosis not present

## 2022-04-15 ENCOUNTER — Ambulatory Visit (HOSPITAL_BASED_OUTPATIENT_CLINIC_OR_DEPARTMENT_OTHER)
Admission: RE | Admit: 2022-04-15 | Discharge: 2022-04-15 | Disposition: A | Discharge status: Home / Self Care | Payer: Federal, State, Local not specified - PPO | Source: Ambulatory Visit | Attending: Internal Medicine | Admitting: Internal Medicine

## 2022-04-15 DIAGNOSIS — R519 Headache, unspecified: Secondary | ICD-10-CM | POA: Insufficient documentation

## 2022-04-15 DIAGNOSIS — I773 Arterial fibromuscular dysplasia: Secondary | ICD-10-CM | POA: Insufficient documentation

## 2022-04-15 DIAGNOSIS — M542 Cervicalgia: Secondary | ICD-10-CM | POA: Diagnosis not present

## 2022-04-15 DIAGNOSIS — R42 Dizziness and giddiness: Secondary | ICD-10-CM | POA: Diagnosis not present

## 2022-04-15 MED ORDER — IOHEXOL 350 MG/ML SOLN
100.0000 mL | Freq: Once | INTRAVENOUS | Status: AC | PRN
  Administered 2022-04-15: 100 mL via INTRAVENOUS

## 2022-04-18 DIAGNOSIS — F410 Panic disorder [episodic paroxysmal anxiety] without agoraphobia: Secondary | ICD-10-CM | POA: Diagnosis not present

## 2022-04-20 ENCOUNTER — Ambulatory Visit (INDEPENDENT_AMBULATORY_CARE_PROVIDER_SITE_OTHER): Payer: Federal, State, Local not specified - PPO

## 2022-04-20 DIAGNOSIS — I340 Nonrheumatic mitral (valve) insufficiency: Secondary | ICD-10-CM

## 2022-04-20 LAB — ECHOCARDIOGRAM COMPLETE
AR max vel: 1.35 cm2
AV Area VTI: 1.45 cm2
AV Area mean vel: 1.32 cm2
AV Mean grad: 9 mmHg
AV Peak grad: 16.6 mmHg
Ao pk vel: 2.04 m/s
Area-P 1/2: 3.42 cm2
S' Lateral: 2.74 cm

## 2022-04-22 ENCOUNTER — Other Ambulatory Visit: Payer: Self-pay | Admitting: Physician Assistant

## 2022-04-23 NOTE — Telephone Encounter (Signed)
Next Visit: 07/11/2022  Last Visit: 03/28/2022  Last Fill: 03/06/2021  Dx: Primary osteoarthritis of both hands  Current Dose per office note on 03/28/2022: not discussed  Okay to refill Voltaren Gel?

## 2022-04-25 ENCOUNTER — Other Ambulatory Visit: Payer: Self-pay | Admitting: Adult Health

## 2022-04-25 DIAGNOSIS — F410 Panic disorder [episodic paroxysmal anxiety] without agoraphobia: Secondary | ICD-10-CM | POA: Diagnosis not present

## 2022-04-29 ENCOUNTER — Other Ambulatory Visit: Payer: Self-pay | Admitting: Internal Medicine

## 2022-05-01 ENCOUNTER — Ambulatory Visit: Payer: Federal, State, Local not specified - PPO | Admitting: Adult Health

## 2022-05-01 ENCOUNTER — Encounter: Payer: Self-pay | Admitting: Adult Health

## 2022-05-01 VITALS — BP 122/66 | HR 72 | Ht 68.0 in | Wt 294.2 lb

## 2022-05-01 DIAGNOSIS — G43709 Chronic migraine without aura, not intractable, without status migrainosus: Secondary | ICD-10-CM | POA: Diagnosis not present

## 2022-05-01 DIAGNOSIS — G4485 Primary stabbing headache: Secondary | ICD-10-CM

## 2022-05-01 MED ORDER — NURTEC 75 MG PO TBDP: ORAL_TABLET | ORAL | 11 refills | Status: DC

## 2022-05-01 MED ORDER — GABAPENTIN 100 MG PO CAPS: 300.0000 mg | ORAL_CAPSULE | Freq: Every day | ORAL | 3 refills | Status: DC

## 2022-05-01 MED ORDER — EMGALITY 120 MG/ML ~~LOC~~ SOAJ
SUBCUTANEOUS | 3 refills | Status: DC
Start: 2022-05-01 — End: 2023-04-11

## 2022-05-01 NOTE — Patient Instructions (Signed)
Your Plan:  Continue emgality  Take Nurtec at the onset of migraine Continue Gabapentin      Thank you for coming to see [name] at [facility]. I hope we have been able to provide you high quality care today.  You may receive a patient satisfaction survey over the next few weeks. We would appreciate your feedback and comments so that we may continue to improve ourselves and the health of our patients.

## 2022-05-01 NOTE — Progress Notes (Signed)
PATIENT: Jill Harper DOB: [birth year]  REASON FOR VISIT: follow up HISTORY FROM: patient  Chief Complaint  Patient presents with   Follow-up    Rm 20, alone.  Here for migraine f/u.  Off relpax due to fibromuscular dysplasia.  Migraines more frequent with aura's. Nurtec helped.  Having 6-8 migraines/ month. (Some last 1-2 days).      HISTORY OF PRESENT ILLNESS: Today 05/01/22: Mrs. Jill Harper is a 56 year old female with a history of migraine headaches and fibromuscular dysplasia.  She returns today for follow-up.  She feels that Verdell Face is working well although she states in the last few months she is having 6-8 headaches a month. The headache may last 2-3 days. Reports approximately 12-15 headache days a month. Uses nurtec for a migraine. She has an actual migraine about 5 times a month.  She states that she does not always take Nurtec at the onset of a migraine.  Sometimes she waits to see if the headache will get more severe.  She was also concerned about overusing the medication.  She returns today for an evaluation  08/14/21: [name] is a 56 year old female with a history of migraine headaches.  She returns today for follow-up.  She states that her migraines have been under relatively good control.  She states is very rare for her to have 1-2 headaches a month.  Emgality has been lasting at least 4 weeks.  Relpax and Nurtec to offer her benefit if she has to take them.  Patient was getting stabbing headaches during the night.  She states that these episodes have also reduced.  She states on occasion she will have a mild headache later in the evening.  This past Friday and Saturday she did wake up early morning with a stabbing headache.  She did use Relpax with good benefit.  She is inquiring if gabapentin can be increased.  Patient has FMD.  She states that in a support group she is part of imaging a full body scan to identify any other areas that has been affected by FMD.  She is curious  if this needs to be completed.  She has an appointment next month with Dr. Delynn Harper.  02/07/21: [name] is a 56 year old female with a history of migraine headaches.  She returns today for follow-up.  She reports that she has approximately 2 migraines a week.  She typically can take Nurtec or Relpax and the headache resolves fairly quickly.  She uses Ajovy for preventative therapy however this is not covered through her insurance she has been using a co-pay savings card.  She would like to switch to [employer] which is covered through her insurance.  She states that taking gabapentin at bedtime has reduced the severity and frequency of the stabbing headaches that she was receiving.  She does report that she often gets them during the day as well.  At least 2 times a week.  She states that it only lasts for several minutes and afterwards she may feel a little foggy in her head will be tender but that resolves and she is able to carry on with her day.  She does use oxygen at night.  She returns today for an evaluation.  11/03/20: [name] is a 56 year old female with a history of migraine headaches.  She is currently taking Ajovy every 3 weeks.  She has Relpax and Nurtec that she rotates nightly.  Reports that she has sharp shooting pains in the left parietal region  that radiates to the temporal region.  This only occurs at night usually wakes her up from sleep.  She has had this type of discomfort before however it has become more frequent recently.  She states that it only lasts for several seconds to minutes before it resolves.  Patient states that she began taking Nurtec every other night and reports that when she took Nurtec she did not wake up with this discomfort.  She states that she did not added Relpax and will take it on the nights that she did not take Nurtec and this also eliminated the discomfort at night. the patient wears a CPAP machine with supplemental oxygen at night managed by [name].  She states  during the day she may have 3 to 4 days a week that she has a mild headache.  She states that the severity has definitely improved.  She typically does not take anything for these headaches.  She reports that sometimes the headaches can last all day long.  She does feel that Ajovy has improved the severity of her headaches.    HISTORY ,(copied from Dr. Cathren Harper note)  When she has a headache/migraine the relpax works. She may have to take 2 doses. Sometimes tylenol will work alone. She has weired feeling and stabbing pain sometimes and lasts a few minutes in the left parietal area. She feels the Ajovy is not as effective, wears out, we discussed changing to Emgality or possibly taking Ajovy q3weeks, gave her sampes, she will try Ajovt q3weeks. Try Nurtec.      Interval history September 23, 2019: Patient here for follow-up, she was last seen in December for hand pain and EMG nerve conduction study did show carpal tunnel syndrome.  PMHx vitamin B12 deficiency, sleep apnea, rheumatoid arthritis, pulmonary fibrosis, polymyositis, osteoarthritis, migraines, joint pain, interstitial lung disease, fibromyalgia, fibromuscular dysplasia, bronchiectasis and abnormal liver function test. She saw Dr. Donzetta Harper and vein in vascular for discoloration of her skin, she gets blue tips of fingers and nails, diagnosed with raynaud's phenomenon related to her RA, no vascular intervention, reviewed his notes and discussed with patient as above. She saw Dr. Mervin Harper for the CTS and they are going to monitor, the symptoms have subsided and her symptoms were slight atypical for CTS on the right, she had ulnar neuropathy on the left and she noticed she did have symptoms and she was given prednisone which helped but it still some and goes, Ajovy working for migraines not daily headaches, she can have an occassional headache and it generally subsides on its own occipital on the left feels hot.    Interval history 07/20/2019: Patient here for a  new chief complaint as requested by Dr. Estanislado Harper for hand pain. PMHx vitamin B12 deficiency, sleep apnea, rheumatoid arthritis, pulmonary fibrosis, polymyositis, osteoarthritis, migraines, joint pain, interstitial lung disease, fibromyalgia, fibromuscular dysplasia, bronchiectasis and abnormal liver function test.  In August or September, she had hand pain. Her hands felt cool, right > left, she started getting cramping in the right hand, not significantly painful just cramped, difficult time straightening it, massage helped. A month later it happened again, more painful, and she could see it "drawing", started getting colder to the touch, she woke up with excruciating pain in the hand and forearm ventrally and she was cramped again. She took 2 alleve. Severe, some numbness, pain felt like tightness, severe and visibly "drawing" up. Massaging helped finger by finger. MRI cervical spine was normal.  She has weakness in her  hands. Progressively worsening. Pain in both shoulders. Some numbness in the first 4 digits. No prior diagnosis of CTS. No injury. She also felt a big knot on the inside of her wrist. Like a muscle spasm. No other focal neurologic deficits, associated symptoms, inciting events or modifiable factors.     Personally reviewed MRI cervical spine 07/08/2019: Normal   Cbc/cmp normal 03/2019   Interval history 06/08/2019: Carotid arteries on CTA c/w FMD. Discussed Fibromuscular Dysplasia, sent to Dr. Estanislado Harper and being followed there. She has sharp pain in the back of the head, stops her because it is so severe, so painful it stops her in her tracks. Leaves her weak afterwards. Can go weeks without it. It is random, unknown what triggers, brief, severe. Also continues to have migraines, left sided, pulsating, pounding, contsant pain, behind the eye, feels she may get ptosis, no lacrimation or rhinorrhea. She has light and sound sensitivity. She has daily headaches, no medication overuse, no aura.  She has 8 migraine days a month with moderate or severe quality and can last 24-72 hours ongoing for > year at this frequency and severity and quality. For FMD and BO meds and a daily asa. Tried atenolol, relpax, telmisartan, verapamil, nortriptyline, topamax. She has some brain fog, discussed normal aging, chronic pain and lifestyle. She has had neck pain, she has numbness and tingling in the left hand and arm, ongoing for >  Months, failed conservative measures, under the care of a physician.     HPI:  MARBETH SMEDLEY is a 56 y.o. female here as requested by Dr. Quay Burow for headaches. PMHx RA, bronchiectasis, polymyositis on imuran follows with Rheumatology, never smoked, interstitial lung disease, fibromyalgia, migraines, mitral valve prolapse, abnormal liver function test. She was supposed to have a sleep study but did not complete that (per notes [name]).  She reports she has headaches that come on during exertion and dissipate without medication when she slows down or alters activities. These are different from her usual migraines. She just had sleep test a few weeks ago, snoring, stops breathing. She does not have the results. She wakes up at night feeling like she can't breathe. She wakes with with "new" headache in the setting of sleep problems for at least 5-6 months. She may have the same headache later in the day. The headache is a fogginedd across  the forehead and more on the left.She wakes with this headache in the setting if snoring and lung problems. The headache is worse with exertion, walking and talking, worse with valsalva and also with sex/orgasm. Rest helps and sitting still and it dissipates slowly. Can be moderately severe. Here with husband who provides much information.   Headache medications used: Atenolol, Flexeril, Relpax,   Reviewed notes, labs and imaging from outside physicians, which showed:   Personally reviewed images from 2013 and agree with the following    Reviewed  [name] notes.  Last time she was seen was 07/28/2018.  Patient has a history of rheumatoid arthritis, polymyositis followed in rheumatology.  Also bronchiectasis.  She was supposed to have a sleep study that was previously ordered but she did not.  Epworth Sleepiness Scale 7 06/16/2018.   ANA negative, rheumatoid factor XX 9, anti-CCP 213, SCL 70, SSA SSB negative, Jo 1+.  Patient's last echocardiogram 05/29/2017 with ejection fraction 60 to 65%, moderate left ventricular hypertrophy, grade 1 DD.  CT of the sinuses were negative for acute disease.    Called Dr. Juanetta Gosling office  and requested results of sleep testing    REVIEW OF SYSTEMS: Out of a complete 14 system review of symptoms, the patient complains only of the following symptoms, and all other reviewed systems are negative.  ALLERGIES: Allergies  Allergen Reactions   Sulfonamide Derivatives     REACTION: hives    HOME MEDICATIONS: Outpatient Medications Prior to Visit  Medication Sig Dispense Refill   albuterol (PROVENTIL HFA) 108 (90 Base) MCG/ACT inhaler Inhale 2 puffs into the lungs 4 (four) times daily as needed. Reported on 09/03/2015 1 each 4   albuterol (PROVENTIL) (2.5 MG/3ML) 0.083% nebulizer solution Take 3 mLs (2.5 mg total) by nebulization every 6 (six) hours as needed for wheezing or shortness of breath. 120 vial 5   aspirin EC 81 MG tablet Take 81 mg by mouth daily.     atenolol (TENORMIN) 25 MG tablet Take 0.5 tablets (12.5 mg total) by mouth daily. 45 tablet 2   azaTHIOprine (IMURAN) 50 MG tablet Take 2 tablets by mouth daily. 60 tablet 2   benzonatate (TESSALON) 200 MG capsule Take 1 capsule (200 mg total) by mouth 3 (three) times daily as needed. 30 capsule 1   Cholecalciferol (VITAMIN D) 50 MCG (2000 UT) CAPS Take by mouth.     diclofenac Sodium (VOLTAREN) 1 % GEL APPLY 2 TO 4 GRAMS TOPICALLY TO AFFECTED JOINT FOUR TIMES DAILY AS NEEDED 400 g 2   DULoxetine (CYMBALTA) 30 MG capsule Take 1 capsule (30 mg total)  by mouth daily. For total of 90 mg daily 30 capsule 3   DULoxetine (CYMBALTA) 60 MG capsule Take 1 capsule (60 mg total) by mouth daily. 90 capsule 1   eletriptan (RELPAX) 20 MG tablet TAKE 1 TABLET BY MOUTH AS NEEDED FOR MIGRAINE 10 tablet 11   EMGALITY 120 MG/ML SOAJ ADMINISTER 1 ML UNDER THE SKIN EVERY 30 DAYS 1 mL 4   ENBREL MINI 50 MG/ML SOCT INSERT 1 MINI CARTRIDGE INTO AUTOINJECTOR AND INJECT 50 MG UNDER THE SKIN EVERY 7 DAYS. 12 mL 0   Fluticasone-Umeclidin-Vilant (TRELEGY ELLIPTA) 100-62.5-25 MCG/ACT AEPB Inhale 1 puff into the lungs daily. 60 each 5   gabapentin (NEURONTIN) 100 MG capsule TAKE 3 CAPSULES(300 MG) BY MOUTH AT BEDTIME 90 capsule 5   guaiFENesin (MUCINEX) 600 MG 12 hr tablet Take 2 tablets (1,200 mg total) by mouth 2 (two) times daily as needed for cough or to loosen phlegm. (Patient taking differently: Take 1,200 mg by mouth 2 (two) times daily.)     metFORMIN (GLUCOPHAGE) 500 MG tablet TAKE 1 TABLET(500 MG) BY MOUTH TWICE DAILY WITH A MEAL 180 tablet 1   methocarbamol (ROBAXIN) 500 MG tablet Take by mouth as needed.     Multiple Vitamins-Minerals (MULTIVITAMIN GUMMIES ADULT PO) Take 2 each by mouth daily.     NURTEC 75 MG TBDP DISSOLVE ONE TABLET BY MOUTH DAILY AS NEEDED FOR MIGRAINES. TAKE AS CLOSE TO ONSET OF MIGRAINE AS POSSIBLE. 1 TABLET DAILY MAXIMUM. 10 tablet 0   sodium chloride HYPERTONIC 3 % nebulizer solution Take by nebulization as needed for cough. 75 mL 12   sodium chloride HYPERTONIC 3 % nebulizer solution Take by nebulization as needed for other. 750 mL 12   telmisartan (MICARDIS) 20 MG tablet TAKE 1 TABLET(20 MG) BY MOUTH DAILY 90 tablet 1   No facility-administered medications prior to visit.    PAST MEDICAL HISTORY: Past Medical History:  Diagnosis Date   Abnormal liver function test    Bronchiectasis  Dyspnea    Fibromuscular dysplasia (HCC)    Fibromyalgia    Heart murmur    ILD (interstitial lung disease) (HCC)    Joint pain     Menorrhagia    MIGRAINE HEADACHE    MVP (mitral valve prolapse)    Osteoarthritis    Polymyositis (HCC)    Dr [name]; chronic MTX   Pulmonary fibrosis (HCC)    Rheumatoid arthritis ([place])    Sleep apnea    Vitamin B 12 deficiency    Vitamin D deficiency     PAST SURGICAL HISTORY: Past Surgical History:  Procedure Laterality Date   ablation uterine  2010   CARDIAC CATHETERIZATION  2002   normal   DILATION AND CURETTAGE OF UTERUS  08-31-08   Dr [name]   IR [place] SEL COM CAROTID INNOMINATE BILAT MOD SED  09/18/2018   IR ANGIO VERTEBRAL SEL VERTEBRAL BILAT MOD SED  09/18/2018   IR US GUIDE VASC ACCESS RIGHT  09/18/2018    FAMILY HISTORY: Family History  Problem Relation Age of Onset   Diabetes Mother    Fibromyalgia Mother    Ulcers Mother    Heart failure Mother    Thyroid disease Mother    Obesity Mother    Multiple myeloma Father    Hypertension Father    Stroke Father    Lupus Sister    Other Sister        abdominal adhesions resulting in bowel obstruction   Migraines Sister    Heart disease Brother        bypass surgery   Rheum arthritis Sister    Multiple myeloma Sister    Hypertension Sister    Heart attack Sister    Diabetes Sister    Hypertension Sister    Rheum arthritis Sister    Diabetes Sister    Diabetes Brother    Headache Other        siblings with headaches but not diagnosed as migraines    SOCIAL HISTORY: Social History   Socioeconomic History   Marital status: Married    Spouse name: Mandie Crabbe   Number of children: 1   Years of education: Not on file   Highest education level: High school graduate  Occupational History   Occupation: Compliant and Injury Clerk  Tobacco Use   Smoking status: Never    Passive exposure: Never   Smokeless tobacco: Never   Tobacco comments:    Married, lives with spouse. 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  Vaping Use   Vaping Use: Never  used  Substance and Sexual Activity   Alcohol use: Not Currently    Comment: maybe a wine cooler once every other year   Drug use: Never   Sexual activity: Yes    Birth control/protection: Surgical  Other Topics Concern   Not on file  Social History Narrative   Exercise: trying to walk - limited by fatigue   Lives at home with husband    Right handed   Caffeine: none    Social Determinants of Health   Financial Resource Strain: Not on file  Food Insecurity: Not on file  Transportation Needs: Not on file  Physical Activity: Not on file  Stress: Not on file  Social Connections: Not on file  Intimate Partner Violence: Not on file      PHYSICAL EXAM  Vitals:   05/01/22 1056  Weight: 294 lb 3.2 oz (133.4 kg)  Height: 5' 8"  (1.727 m)    Body mass index is 44.73 kg/m.  Generalized: Well developed, in no acute distress   Neurological examination  Mentation: Alert oriented to time, place, history taking. Follows all commands speech and language fluent Cranial nerve II-XII: Pupils were equal round reactive to light. Extraocular movements were full, visual field were full on confrontational test. Facial sensation and strength were normal. . Head turning and shoulder shrug  were normal and symmetric. Motor: The motor testing reveals 5 over 5 strength of all 4 extremities. Good symmetric motor tone is noted throughout.  Sensory: Sensory testing is intact to soft touch on all 4 extremities. No evidence of extinction is noted.  Coordination: Cerebellar testing reveals good finger-nose-finger and heel-to-shin bilaterally.  Gait and station: Gait is normal.  Reflexes: Deep tendon reflexes are symmetric and normal bilaterally.   DIAGNOSTIC DATA (LABS, IMAGING, TESTING) - I reviewed patient records, labs, notes, testing and imaging myself where available.  Lab Results  Component Value Date   WBC 8.2 02/14/2022   HGB 13.3 02/14/2022   HCT 40.5 02/14/2022   MCV 86.7 02/14/2022    PLT 296 02/14/2022      Component Value Date/Time   NA 140 02/14/2022 1504   NA 141 10/12/2019 1432   K 4.5 02/14/2022 1504   CL 107 02/14/2022 1504   CO2 26 02/14/2022 1504   GLUCOSE 98 02/14/2022 1504   BUN 11 02/14/2022 1504   BUN 11 10/12/2019 1432   CREATININE 0.71 02/14/2022 1504   CALCIUM 9.6 02/14/2022 1504   PROT 7.3 02/14/2022 1504   PROT 7.7 10/12/2019 1432   ALBUMIN 4.1 07/11/2021 0813   ALBUMIN 4.6 10/12/2019 1432   AST 18 02/14/2022 1504   ALT 15 02/14/2022 1504   ALKPHOS 60 07/11/2021 0813   BILITOT 0.4 02/14/2022 1504   BILITOT 0.3 10/12/2019 1432   GFRNONAA 98 01/18/2021 1626   GFRAA 114 01/18/2021 1626   Lab Results  Component Value Date   CHOL 129 07/11/2021   HDL 39.60 07/11/2021   LDLCALC 74 07/11/2021   TRIG 78.0 07/11/2021   CHOLHDL 3 07/11/2021   Lab Results  Component Value Date   HGBA1C 5.9 07/11/2021   Lab Results  Component Value Date   VITAMINB12 921 (H) 07/11/2021   Lab Results  Component Value Date   TSH 6.06 (H) 07/11/2021      ASSESSMENT AND PLAN 56 y.o. year old female  has a past medical history of Abnormal liver function test, Bronchiectasis, Dyspnea, Fibromuscular dysplasia (HCC), Fibromyalgia, Heart murmur, ILD (interstitial lung disease) ([place]), Joint pain, Menorrhagia, MIGRAINE HEADACHE, MVP (mitral valve prolapse), Osteoarthritis, Polymyositis ([place]), Pulmonary fibrosis ([place]), Rheumatoid arthritis ([place]), Sleep apnea, Vitamin B 12 deficiency, and Vitamin D deficiency. here with:  1.  Migraine headache   --Continue Emgality 120 mg monthly injection --Continue Nurtec for abortive therapy.advised patient that she should take the medication at the onset of a migraine. -- We will see if this is beneficial if not we may need to change her preventative medicine in the future -- Not a candidate for triptans due to diagnosis of FMD  2.  Stabbing headache  -- Increase gabapentin to 300 mg at bedtime   Follow-up in 6 months  or sooner if needed   Ward Givens, MSN, NP-C 05/01/2022, 10:57 AM [facility] [address] [place],  [address] [phone]

## 2022-05-02 DIAGNOSIS — F410 Panic disorder [episodic paroxysmal anxiety] without agoraphobia: Secondary | ICD-10-CM | POA: Diagnosis not present

## 2022-05-09 DIAGNOSIS — F410 Panic disorder [episodic paroxysmal anxiety] without agoraphobia: Secondary | ICD-10-CM | POA: Diagnosis not present

## 2022-05-14 NOTE — Progress Notes (Unsigned)
Subjective:    Patient ID: Jill Harper, female    DOB: [birth year], 56 y.o.   MRN: [id number]     HPI Myeesha is here for follow up of her chronic medical problems, including anxiety, htn  Still seeing the work therapist.  Cymbalta working well.   Some days doing full atenolol not 1/2 pill.  She has an occasional pulling sensation in chest.  Taking it in the evening.    Has days she has fatigue and has a bad day - can last a few days and then gets better.    Medications and allergies reviewed with patient and updated if appropriate.  Current Outpatient Medications on File Prior to Visit  Medication Sig Dispense Refill   albuterol (PROVENTIL HFA) 108 (90 Base) MCG/ACT inhaler Inhale 2 puffs into the lungs 4 (four) times daily as needed. Reported on 09/03/2015 1 each 4   albuterol (PROVENTIL) (2.5 MG/3ML) 0.083% nebulizer solution Take 3 mLs (2.5 mg total) by nebulization every 6 (six) hours as needed for wheezing or shortness of breath. 120 vial 5   aspirin EC 81 MG tablet Take 81 mg by mouth daily.     azaTHIOprine (IMURAN) 50 MG tablet Take 2 tablets by mouth daily. 60 tablet 2   benzonatate (TESSALON) 200 MG capsule Take 1 capsule (200 mg total) by mouth 3 (three) times daily as needed. 30 capsule 1   Cholecalciferol (VITAMIN D) 50 MCG (2000 UT) CAPS Take by mouth.     diclofenac Sodium (VOLTAREN) 1 % GEL APPLY 2 TO 4 GRAMS TOPICALLY TO AFFECTED JOINT FOUR TIMES DAILY AS NEEDED 400 g 2   DULoxetine (CYMBALTA) 30 MG capsule Take 1 capsule (30 mg total) by mouth daily. For total of 90 mg daily 30 capsule 3   DULoxetine (CYMBALTA) 60 MG capsule Take 1 capsule (60 mg total) by mouth daily. 90 capsule 1   ENBREL MINI 50 MG/ML SOCT INSERT 1 MINI CARTRIDGE INTO AUTOINJECTOR AND INJECT 50 MG UNDER THE SKIN EVERY 7 DAYS. 12 mL 0   Fluticasone-Umeclidin-Vilant (TRELEGY ELLIPTA) 100-62.5-25 MCG/ACT AEPB Inhale 1 puff into the lungs daily. 60 each 5   gabapentin (NEURONTIN) 100 MG capsule  Take 3 capsules (300 mg total) by mouth at bedtime. 270 capsule 3   Galcanezumab-gnlm (EMGALITY) 120 MG/ML SOAJ ADMINISTER 1 ML UNDER THE SKIN EVERY 30 DAYS 3 mL 3   guaiFENesin (MUCINEX) 600 MG 12 hr tablet Take 2 tablets (1,200 mg total) by mouth 2 (two) times daily as needed for cough or to loosen phlegm. (Patient taking differently: Take 1,200 mg by mouth 2 (two) times daily.)     metFORMIN (GLUCOPHAGE) 500 MG tablet TAKE 1 TABLET(500 MG) BY MOUTH TWICE DAILY WITH A MEAL 180 tablet 1   methocarbamol (ROBAXIN) 500 MG tablet Take by mouth as needed.     Multiple Vitamins-Minerals (MULTIVITAMIN GUMMIES ADULT PO) Take 2 each by mouth daily.     Rimegepant Sulfate (NURTEC) 75 MG TBDP DISSOLVE ONE TABLET BY MOUTH DAILY AS NEEDED FOR MIGRAINES. TAKE AS CLOSE TO ONSET OF MIGRAINE AS POSSIBLE. 1 TABLET DAILY MAXIMUM. 15 tablet 11   sodium chloride HYPERTONIC 3 % nebulizer solution Take by nebulization as needed for other. 750 mL 12   telmisartan (MICARDIS) 20 MG tablet TAKE 1 TABLET(20 MG) BY MOUTH DAILY 90 tablet 1   No current facility-administered medications on file prior to visit.     Review of Systems  Constitutional:  Negative for chills  and fever.  Cardiovascular:  Negative for chest pain, palpitations and leg swelling.  Neurological:  Positive for headaches. Negative for light-headedness.       Objective:   Vitals:   05/15/22 1545  BP: 132/72  Pulse: 80  Temp: 98.5 F (36.9 C)  SpO2: 97%   BP Readings from Last 3 Encounters:  05/15/22 132/72  05/01/22 122/66  04/02/22 134/78   Wt Readings from Last 3 Encounters:  05/15/22 293 lb (132.9 kg)  05/01/22 294 lb 3.2 oz (133.4 kg)  04/02/22 291 lb (132 kg)   Body mass index is 44.55 kg/m.    Physical Exam Constitutional:      General: She is not in acute distress.    Appearance: Normal appearance. She is not ill-appearing.  HENT:     Head: Normocephalic and atraumatic.  Skin:    General: Skin is warm and dry.   Neurological:     Mental Status: She is alert.  Psychiatric:        Mood and Affect: Mood normal.        Behavior: Behavior normal.        Thought Content: Thought content normal.        Judgment: Judgment normal.        Lab Results  Component Value Date   WBC 8.2 02/14/2022   HGB 13.3 02/14/2022   HCT 40.5 02/14/2022   PLT 296 02/14/2022   GLUCOSE 98 02/14/2022   CHOL 129 07/11/2021   TRIG 78.0 07/11/2021   HDL 39.60 07/11/2021   LDLCALC 74 07/11/2021   ALT 15 02/14/2022   AST 18 02/14/2022   NA 140 02/14/2022   K 4.5 02/14/2022   CL 107 02/14/2022   CREATININE 0.71 02/14/2022   BUN 11 02/14/2022   CO2 26 02/14/2022   TSH 6.06 (H) 07/11/2021   INR 1.03 09/18/2018   HGBA1C 5.9 07/11/2021     Assessment & Plan:    See Problem List for Assessment and Plan of chronic medical problems.

## 2022-05-15 ENCOUNTER — Encounter: Payer: Self-pay | Admitting: Internal Medicine

## 2022-05-15 ENCOUNTER — Ambulatory Visit: Payer: Federal, State, Local not specified - PPO | Admitting: Internal Medicine

## 2022-05-15 DIAGNOSIS — R002 Palpitations: Secondary | ICD-10-CM

## 2022-05-15 DIAGNOSIS — G43809 Other migraine, not intractable, without status migrainosus: Secondary | ICD-10-CM

## 2022-05-15 DIAGNOSIS — I10 Essential (primary) hypertension: Secondary | ICD-10-CM | POA: Diagnosis not present

## 2022-05-15 DIAGNOSIS — F419 Anxiety disorder, unspecified: Secondary | ICD-10-CM | POA: Diagnosis not present

## 2022-05-15 MED ORDER — ATENOLOL 25 MG PO TABS: 25.0000 mg | ORAL_TABLET | Freq: Every day | ORAL | 2 refills | Status: DC

## 2022-05-15 NOTE — Patient Instructions (Addendum)
      Medications changes include :   take the atenolol 25 mg daily     Your prescription(s) have been sent to your pharmacy.     Return in about 3 months (around 08/15/2022) for follow up.

## 2022-05-15 NOTE — Assessment & Plan Note (Signed)
Chronic Improved with higher dose of atenolol Increase atenolol to 25 mg daily

## 2022-05-15 NOTE — Assessment & Plan Note (Addendum)
Chronic Managed by Dr Jaynee Eagles Can not take triptans Taking emgality and  nurtec prn

## 2022-05-15 NOTE — Assessment & Plan Note (Signed)
Chronic BP well controlled with higher dose of atenolol Continue increase atenolol to 25 mg daily, telmisartan 20 mg daily

## 2022-05-15 NOTE — Assessment & Plan Note (Signed)
Chronic Controlled, stable Continue cymbalta 90 mg daily

## 2022-05-16 DIAGNOSIS — F410 Panic disorder [episodic paroxysmal anxiety] without agoraphobia: Secondary | ICD-10-CM | POA: Diagnosis not present

## 2022-05-19 ENCOUNTER — Other Ambulatory Visit: Payer: Self-pay | Admitting: Physician Assistant

## 2022-05-19 DIAGNOSIS — Z79899 Other long term (current) drug therapy: Secondary | ICD-10-CM

## 2022-05-21 NOTE — Telephone Encounter (Signed)
Next Visit: 07/11/2022   Last Visit: 03/28/2022   Last Fill: 02/14/2022  Dx: Rheumatoid arthritis involving multiple sites with positive rheumatoid factor    Current Dose per office note on 03/28/2022: Imuran 100 mg by mouth daily  Labs: 02/23/2022 CBC and CMP WNL.  CK WNL.    Patient advised she is due to update labs. Patient states she will be in this week to update labs.   Okay to refill Imuran?

## 2022-05-22 ENCOUNTER — Other Ambulatory Visit: Payer: Self-pay | Admitting: *Deleted

## 2022-05-22 DIAGNOSIS — Z79899 Other long term (current) drug therapy: Secondary | ICD-10-CM

## 2022-05-22 DIAGNOSIS — F410 Panic disorder [episodic paroxysmal anxiety] without agoraphobia: Secondary | ICD-10-CM | POA: Diagnosis not present

## 2022-05-23 LAB — COMPLETE METABOLIC PANEL WITH GFR
AG Ratio: 1.3 (calc) (ref 1.0–2.5)
ALT: 14 U/L (ref 6–29)
AST: 14 U/L (ref 10–35)
Albumin: 4 g/dL (ref 3.6–5.1)
Alkaline phosphatase (APISO): 63 U/L (ref 37–153)
BUN: 10 mg/dL (ref 7–25)
CO2: 26 mmol/L (ref 20–32)
Calcium: 9.3 mg/dL (ref 8.6–10.4)
Chloride: 108 mmol/L (ref 98–110)
Creat: 0.7 mg/dL (ref 0.50–1.03)
Globulin: 3.1 g/dL (calc) (ref 1.9–3.7)
Glucose, Bld: 109 mg/dL — ABNORMAL HIGH (ref 65–99)
Potassium: 4.4 mmol/L (ref 3.5–5.3)
Sodium: 141 mmol/L (ref 135–146)
Total Bilirubin: 0.4 mg/dL (ref 0.2–1.2)
Total Protein: 7.1 g/dL (ref 6.1–8.1)
eGFR: 101 mL/min/{1.73_m2} (ref >=60)

## 2022-05-23 LAB — CBC WITH DIFFERENTIAL/PLATELET
Absolute Monocytes: 632 cells/uL (ref 200–950)
Basophils Absolute: 41 cells/uL (ref 0–200)
Basophils Relative: 0.5 %
Eosinophils Absolute: 162 cells/uL (ref 15–500)
Eosinophils Relative: 2 %
HCT: 38.6 % (ref 35.0–45.0)
Hemoglobin: 12.7 g/dL (ref 11.7–15.5)
Lymphs Abs: 1555 cells/uL (ref 850–3900)
MCH: 27.7 pg (ref 27.0–33.0)
MCHC: 32.9 g/dL (ref 32.0–36.0)
MCV: 84.3 fL (ref 80.0–100.0)
MPV: 11.1 fL (ref 7.5–12.5)
Monocytes Relative: 7.8 %
Neutro Abs: 5711 cells/uL (ref 1500–7800)
Neutrophils Relative %: 70.5 %
Platelets: 269 10*3/uL (ref 140–400)
RBC: 4.58 10*6/uL (ref 3.80–5.10)
RDW: 12.4 % (ref 11.0–15.0)
Total Lymphocyte: 19.2 %
WBC: 8.1 10*3/uL (ref 3.8–10.8)

## 2022-05-23 NOTE — Progress Notes (Signed)
CBC and CMP are normal.

## 2022-05-24 ENCOUNTER — Other Ambulatory Visit: Payer: Self-pay | Admitting: Rheumatology

## 2022-05-24 DIAGNOSIS — M0579 Rheumatoid arthritis with rheumatoid factor of multiple sites without organ or systems involvement: Secondary | ICD-10-CM

## 2022-05-24 NOTE — Telephone Encounter (Signed)
Next Visit: 07/11/2022  Last Visit: 03/28/2022  Last Fill: 03/06/2022  [id number] arthritis involving multiple sites with positive rheumatoid factor   Current Dose per office note 03/28/2022: Enbrel 50 mg subcu injections once weekly   Labs: 05/22/2022 CBC and CMP are normal.  TB Gold: 02/14/2022   Okay to refill Enbrel?

## 2022-05-26 ENCOUNTER — Other Ambulatory Visit: Payer: Self-pay | Admitting: Internal Medicine

## 2022-05-30 DIAGNOSIS — F410 Panic disorder [episodic paroxysmal anxiety] without agoraphobia: Secondary | ICD-10-CM | POA: Diagnosis not present

## 2022-06-06 DIAGNOSIS — F410 Panic disorder [episodic paroxysmal anxiety] without agoraphobia: Secondary | ICD-10-CM | POA: Diagnosis not present

## 2022-06-07 ENCOUNTER — Encounter: Payer: Self-pay | Admitting: Internal Medicine

## 2022-06-12 ENCOUNTER — Ambulatory Visit (INDEPENDENT_AMBULATORY_CARE_PROVIDER_SITE_OTHER): Payer: Federal, State, Local not specified - PPO | Admitting: Pulmonary Disease

## 2022-06-12 ENCOUNTER — Encounter (HOSPITAL_BASED_OUTPATIENT_CLINIC_OR_DEPARTMENT_OTHER): Payer: Self-pay | Admitting: Pulmonary Disease

## 2022-06-12 VITALS — BP 124/68 | HR 76 | Temp 98.7°F | Ht 68.0 in | Wt 279.0 lb

## 2022-06-12 DIAGNOSIS — J849 Interstitial pulmonary disease, unspecified: Secondary | ICD-10-CM

## 2022-06-12 DIAGNOSIS — G4734 Idiopathic sleep related nonobstructive alveolar hypoventilation: Secondary | ICD-10-CM | POA: Diagnosis not present

## 2022-06-12 DIAGNOSIS — J479 Bronchiectasis, uncomplicated: Secondary | ICD-10-CM

## 2022-06-12 DIAGNOSIS — G4733 Obstructive sleep apnea (adult) (pediatric): Secondary | ICD-10-CM | POA: Diagnosis not present

## 2022-06-12 NOTE — Patient Instructions (Signed)
Follow up in 4 months 

## 2022-06-12 NOTE — Progress Notes (Signed)
Peoa Harper, Critical Care, and Sleep Medicine  Chief Complaint  Patient presents with   Follow-up    Pt states she has started using her Smart Vest and she can tell a difference. Pt states that she is still experiencing SOB some days.    Constitutional:  BP 124/68 (BP Location: Left Arm, Patient Position: Sitting, Cuff Size: Large)   Pulse 76   Temp 98.7 F (37.1 C) (Oral)   Ht _0  (1.727 m)   Wt 279 lb (126.6 kg)   SpO2 98%   BMI 42.42 kg/m   Past Medical History:  Fibromyalgia, Migraine HA, MVP, OA, Fibromuscular dysplasia, Erythema nodosum, Vit D deficiency  Past Surgical History:  Her  has a past surgical history that includes Cardiac catheterization (2002); Dilation and curettage of uterus (08-31-08); ablation uterine (2010); IR ANGIO VERTEBRAL SEL VERTEBRAL BILAT MOD SED (09/18/2018); IR ANGIO INTRA EXTRACRAN SEL COM CAROTID INNOMINATE BILAT MOD SED (09/18/2018); and IR US Guide Vasc Access Right (09/18/2018).  Brief Summary:  Jill Harper is a 56 y.o. female with bronchiectasis in the setting of seropositive rheumatoid arthritis and polymyositis.      Subjective:   She has been doing better since she started using chest vest.  Uses this twice per day.  Her phlegm is darker in the morning, and lightens up later in the day.  No fever, chest pain, or wheeze.  Energy level has been good.  No issues with CPAP machine.    She is thinking about having breast reduction to help with back pain and hoping this can help improve her breathing also.  Physical Exam:   Appearance - well kempt   ENMT - no sinus tenderness, no oral exudate, no LAN, Mallampati 3 airway, no stridor  Respiratory - equal breath sounds bilaterally, no wheezing or rales  CV - s1s2 regular rate and rhythm, no murmurs  Ext - no clubbing, no edema  Skin - no rashes  Psych - normal mood and affect       Harper testing:  PFT 10/30/11 >> FEV1 3.31 (114%), FEV1% 85, TLC 6.06 (108%), DLCO  77%, no BD PFT 11/18/12 >> FEV1 3.18 (111%), FEV1% 84, TLC 5.34 (95%), DLCO 67%, no BD PFT 12/02/15 >> FEV1 2.96 (108%), FEV1% 92, TLC 4.95 (86%), DLCO 65%, no BD PFT 04/17/18 >> FEV1 2.46 (101%), FEV1% 90, DLCO 76% PFT 02/05/19 >> FEV1 2.30 (87%), FEV1% 89, TLC 4.21 (74%), DLCO 81%, no BD PFT 02/17/20 >> FEV1 2.18 (83%), FEV1% 90, TLC 4.22 (74%), DLCO 78% PFT 07/27/21 >> FEV1 2.41 (93%), FEV1% 89, TLC 4.04 (71%), DLCO 80%  Chest Imaging:  CT chest 03/23/05 >> patchy b/l lower lung ASD with cylindrical BTX CT chest 02/17/08 >> peripheral and basilar predominant subpleural GGO CT chest 12/01/08 >> no change HRCT chest 09/07/15 >> scattered GGO HRCT chest 04/15/18 >> patchy confluent subpleural and peripheral peribronchovascular reticulation and ground-glass opacity throughout both lungs with a strong basilar gradient, traction BTX HRCT chest 03/02/20 >> widespread patchy GGO, septal thickening, mild cylindrical BTX, craniocaudal gradient with mild progression, mild air trapping HRCT chest 10/25/21 >> patchy GGO, septal thickening, small amount of subpleural reticulation, mild cylindrical BTX without signifcant change (fibrotic NSIP versus probable UIP)  Labs:  Quantiferon gold 01/31/17 >> negative Serology 01/31/17 >> RF 29, CCP 213, Jo-1 > 8; ANA, SCL 70, SSA/SSB, ENA Sm negative Quantiferon gold 01/31/17 >> negative Ig 01/31/17 >> IgG 1992, IgA 147, IgM 136  Sleep Tests:  HST 07/28/18 >>  AHI 5, SpO2 low 83% ONO with CPAP 11/05/18 >> test time 6 hrs 24 min, baseline SpO2 90%, low SpO2 80%; spent 1 hr 15 min with SpO2 < 88% Auto CPAP 05/12/22 to 06/10/22 >> used on 26 of 30 nights with average 13 hrs 20 min.  Average AHI 4 with median CPAP 10 and 95 th percentile CPAP 13 cm H2O  Cardiac Tests:  Echo 11/22/20 >> EF 65 to 70%, mild LVH  Social History:  She  reports that she has never smoked. She has never been exposed to tobacco smoke. She has never used smokeless tobacco. She reports that she does not  currently use alcohol. She reports that she does not use drugs.  Family History:  Her family history includes Diabetes in her brother, mother, sister, and sister; Fibromyalgia in her mother; Headache in an other family member; Heart attack in her sister; Heart disease in her brother; Heart failure in her mother; Hypertension in her father, sister, and sister; Lupus in her sister; Migraines in her sister; Multiple myeloma in her father and sister; Obesity in her mother; Other in her sister; Rheum arthritis in her sister and sister; Stroke in her father; Thyroid disease in her mother; Ulcers in her mother.      Assessment/Plan:   ILD with UIP pattern and Bronchiectasis in setting of RA and polymyositis. - continue bronchial hygiene with chest vest, albuterol, mucinex and flutter valve bid  Rheumatoid arthritis with positive RF and CCP, polymyositis. - followed by Dr. Bo Merino with rheumatology - maintained on imuran and enbrel; would need to coordinate plan regarding medications if she has surgery for breast reduction or ulnar neuropathy   Obstructive sleep apnea. - she is compliant with CPAP and reports benefit from therapy - she uses [employer] for her DME - her current CPAP was ordered on 08/20/18 - continue auto CPAP 7 to 15 cm H2O   Nocturnal hypoxemia. - 2.5 liters oxygen at night with CPAP and prn during the day with dyspnea and headache  Migraine headache. - followed by Dr. Sarina Ill with [facility]  Time Spent Involved in Patient Care on Day of Examination:  36 minutes  Follow up:   Patient Instructions  Follow up in 4 months  Medication List:   Allergies as of 06/12/2022       Reactions   Sulfonamide Derivatives    REACTION: hives        Medication List        Accurate as of June 12, 2022 12:16 PM. If you have any questions, ask your nurse or doctor.          albuterol (2.5 MG/3ML) 0.083% nebulizer solution Commonly known as:  PROVENTIL Take 3 mLs (2.5 mg total) by nebulization every 6 (six) hours as needed for wheezing or shortness of breath.   albuterol 108 (90 Base) MCG/ACT inhaler Commonly known as: Proventil HFA Inhale 2 puffs into the lungs 4 (four) times daily as needed. Reported on 09/03/2015   aspirin EC 81 MG tablet Take 81 mg by mouth daily.   atenolol 25 MG tablet Commonly known as: TENORMIN Take 1 tablet (25 mg total) by mouth daily.   azaTHIOprine 50 MG tablet Commonly known as: IMURAN TAKE 2 TABLETS BY MOUTH DAILY   benzonatate 200 MG capsule Commonly known as: TESSALON Take 1 capsule (200 mg total) by mouth 3 (three) times daily as needed.   diclofenac Sodium 1 % Gel Commonly known as: VOLTAREN APPLY 2 TO 4  GRAMS TOPICALLY TO AFFECTED JOINT FOUR TIMES DAILY AS NEEDED   DULoxetine 30 MG capsule Commonly known as: Cymbalta Take 1 capsule (30 mg total) by mouth daily. For total of 90 mg daily   DULoxetine 60 MG capsule Commonly known as: CYMBALTA TAKE 1 CAPSULE(60 MG) BY MOUTH DAILY   Emgality 120 MG/ML Soaj Generic drug: Galcanezumab-gnlm ADMINISTER 1 ML UNDER THE SKIN EVERY 30 DAYS   Enbrel Mini 50 MG/ML Soct Generic drug: Etanercept INSERT 1 MINI CARTRIDGE INTO AUTOINJECTOR AND INJECT 50 MG UNDER THE SKIN EVERY 7 DAYS.   gabapentin 100 MG capsule Commonly known as: NEURONTIN Take 3 capsules (300 mg total) by mouth at bedtime.   guaiFENesin 600 MG 12 hr tablet Commonly known as: Mucinex Take 2 tablets (1,200 mg total) by mouth 2 (two) times daily as needed for cough or to loosen phlegm. What changed: when to take this   metFORMIN 500 MG tablet Commonly known as: GLUCOPHAGE TAKE 1 TABLET(500 MG) BY MOUTH TWICE DAILY WITH A MEAL   methocarbamol 500 MG tablet Commonly known as: ROBAXIN Take by mouth as needed.   MULTIVITAMIN GUMMIES ADULT PO Take 2 each by mouth daily.   Nurtec 75 MG Tbdp Generic drug: Rimegepant Sulfate DISSOLVE ONE TABLET BY MOUTH DAILY AS NEEDED  FOR MIGRAINES. TAKE AS CLOSE TO ONSET OF MIGRAINE AS POSSIBLE. 1 TABLET DAILY MAXIMUM.   sodium chloride HYPERTONIC 3 % nebulizer solution Take by nebulization as needed for other.   telmisartan 20 MG tablet Commonly known as: MICARDIS TAKE 1 TABLET(20 MG) BY MOUTH DAILY   Trelegy Ellipta 100-62.5-25 MCG/ACT Aepb Generic drug: Fluticasone-Umeclidin-Vilant Inhale 1 puff into the lungs daily.   Vitamin D 50 MCG (2000 UT) Caps Take by mouth.        Signature:  Chesley Mires, MD [place] Pager - [phone] 06/12/2022, 12:16 PM

## 2022-06-13 DIAGNOSIS — F410 Panic disorder [episodic paroxysmal anxiety] without agoraphobia: Secondary | ICD-10-CM | POA: Diagnosis not present

## 2022-06-14 ENCOUNTER — Telehealth: Payer: Self-pay | Admitting: Pharmacist

## 2022-06-14 NOTE — Telephone Encounter (Signed)
Submitted a Prior Authorization RENEWAL request to [facility] for ENBREL via CoverMyMeds. Will update once we receive a response.   Key: [id number]

## 2022-06-15 NOTE — Telephone Encounter (Signed)
Received notification from [facility] regarding a prior authorization for ENBREL. Authorization has been APPROVED from 05/15/22 to 12/11/2023.   Patient must continue to fill through [facility]: [phone]  Auth # [id number]  Knox Saliva, PharmD, MPH, BCPS, CPP Clinical Pharmacist (Rheumatology and Pulmonology)

## 2022-06-19 DIAGNOSIS — J471 Bronchiectasis with (acute) exacerbation: Secondary | ICD-10-CM | POA: Diagnosis not present

## 2022-06-19 DIAGNOSIS — G4733 Obstructive sleep apnea (adult) (pediatric): Secondary | ICD-10-CM | POA: Diagnosis not present

## 2022-06-20 DIAGNOSIS — F410 Panic disorder [episodic paroxysmal anxiety] without agoraphobia: Secondary | ICD-10-CM | POA: Diagnosis not present

## 2022-06-26 NOTE — Progress Notes (Unsigned)
Subjective:    Patient ID: Jill Harper, female    DOB: 04/18/1966, 56 y.o.   MRN: [id number]      HPI Brailee is here for No chief complaint on file.   Vertigo -      Medications and allergies reviewed with patient and updated if appropriate.  Current Outpatient Medications on File Prior to Visit  Medication Sig Dispense Refill   albuterol (PROVENTIL HFA) 108 (90 Base) MCG/ACT inhaler Inhale 2 puffs into the lungs 4 (four) times daily as needed. Reported on 09/03/2015 1 each 4   albuterol (PROVENTIL) (2.5 MG/3ML) 0.083% nebulizer solution Take 3 mLs (2.5 mg total) by nebulization every 6 (six) hours as needed for wheezing or shortness of breath. 120 vial 5   aspirin EC 81 MG tablet Take 81 mg by mouth daily.     atenolol (TENORMIN) 25 MG tablet Take 1 tablet (25 mg total) by mouth daily. 90 tablet 2   azaTHIOprine (IMURAN) 50 MG tablet TAKE 2 TABLETS BY MOUTH DAILY 60 tablet 2   benzonatate (TESSALON) 200 MG capsule Take 1 capsule (200 mg total) by mouth 3 (three) times daily as needed. 30 capsule 1   Cholecalciferol (VITAMIN D) 50 MCG (2000 UT) CAPS Take by mouth.     diclofenac Sodium (VOLTAREN) 1 % GEL APPLY 2 TO 4 GRAMS TOPICALLY TO AFFECTED JOINT FOUR TIMES DAILY AS NEEDED 400 g 2   DULoxetine (CYMBALTA) 30 MG capsule Take 1 capsule (30 mg total) by mouth daily. For total of 90 mg daily 30 capsule 3   DULoxetine (CYMBALTA) 60 MG capsule TAKE 1 CAPSULE(60 MG) BY MOUTH DAILY 90 capsule 1   ENBREL MINI 50 MG/ML SOCT INSERT 1 MINI CARTRIDGE INTO AUTOINJECTOR AND INJECT 50 MG UNDER THE SKIN EVERY 7 DAYS. 12 mL 0   Fluticasone-Umeclidin-Vilant (TRELEGY ELLIPTA) 100-62.5-25 MCG/ACT AEPB Inhale 1 puff into the lungs daily. 60 each 5   gabapentin (NEURONTIN) 100 MG capsule Take 3 capsules (300 mg total) by mouth at bedtime. 270 capsule 3   Galcanezumab-gnlm (EMGALITY) 120 MG/ML SOAJ ADMINISTER 1 ML UNDER THE SKIN EVERY 30 DAYS 3 mL 3   guaiFENesin (MUCINEX) 600 MG 12 hr tablet Take 2  tablets (1,200 mg total) by mouth 2 (two) times daily as needed for cough or to loosen phlegm. (Patient taking differently: Take 1,200 mg by mouth 2 (two) times daily.)     metFORMIN (GLUCOPHAGE) 500 MG tablet TAKE 1 TABLET(500 MG) BY MOUTH TWICE DAILY WITH A MEAL 180 tablet 1   methocarbamol (ROBAXIN) 500 MG tablet Take by mouth as needed.     Multiple Vitamins-Minerals (MULTIVITAMIN GUMMIES ADULT PO) Take 2 each by mouth daily.     Rimegepant Sulfate (NURTEC) 75 MG TBDP DISSOLVE ONE TABLET BY MOUTH DAILY AS NEEDED FOR MIGRAINES. TAKE AS CLOSE TO ONSET OF MIGRAINE AS POSSIBLE. 1 TABLET DAILY MAXIMUM. 15 tablet 11   sodium chloride HYPERTONIC 3 % nebulizer solution Take by nebulization as needed for other. 750 mL 12   telmisartan (MICARDIS) 20 MG tablet TAKE 1 TABLET(20 MG) BY MOUTH DAILY 90 tablet 1   No current facility-administered medications on file prior to visit.    Review of Systems     Objective:  There were no vitals filed for this visit. BP Readings from Last 3 Encounters:  06/12/22 124/68  05/15/22 132/72  05/01/22 122/66   Wt Readings from Last 3 Encounters:  06/12/22 279 lb (126.6 kg)  05/15/22 293 lb (132.9  kg)  05/01/22 294 lb 3.2 oz (133.4 kg)   There is no height or weight on file to calculate BMI.    Physical Exam         Assessment & Plan:    See Problem List for Assessment and Plan of chronic medical problems.

## 2022-06-27 ENCOUNTER — Ambulatory Visit (INDEPENDENT_AMBULATORY_CARE_PROVIDER_SITE_OTHER): Payer: Federal, State, Local not specified - PPO | Admitting: Internal Medicine

## 2022-06-27 ENCOUNTER — Encounter: Payer: Self-pay | Admitting: Internal Medicine

## 2022-06-27 VITALS — BP 134/70 | HR 82 | Temp 98.3°F | Ht 68.0 in | Wt 293.0 lb

## 2022-06-27 DIAGNOSIS — R42 Dizziness and giddiness: Secondary | ICD-10-CM | POA: Diagnosis not present

## 2022-06-27 MED ORDER — MECLIZINE HCL 25 MG PO TABS: 25.0000 mg | ORAL_TABLET | Freq: Three times a day (TID) | ORAL | 0 refills | Status: AC | PRN

## 2022-06-27 NOTE — Patient Instructions (Addendum)
      Medications changes include :   meclizine 25 mg three times a day as needed    A referral was ordered for Physical therapy.     Someone will call you to schedule an appointment.    Return if symptoms worsen or fail to improve.

## 2022-06-27 NOTE — Assessment & Plan Note (Addendum)
Acute Started three weeks ago - was getting better and then last week got worse again Symptoms c/w BPPV - associated with head movements No evidence of infection, cardiac cause, neurological cause Meclizine 25 mg TID prn - aware this causes drowsiness - can try non drowsy dramamine Deferred valium Referral for PT

## 2022-06-27 NOTE — Progress Notes (Signed)
Office Visit Note  Patient: Jill Harper             Date of Birth: 1966-02-12           MRN: [id number]             PCP: Jill Rail, MD Referring: Jill Rail, MD Visit Date: 07/11/2022 Occupation: _0 @  Subjective:  Medication monitoring   History of Present Illness: Jill Harper is a 56 y.o. female with history of seropositive rheumatoid arthritis, polymyositis, and ILD.  Patient remains on Imuran 100 mg daily and Enbrel 50 mg subcutaneous injections once weekly.  She is tolerating combination therapy without any side effects or injection site reactions from Enbrel.  Overall her symptoms have been stable.  She has had some increased myalgias and arthralgias with cooler weather temperatures especially with the temperature swings throughout the day.  She denies any joint swelling at this time.  She has had some trapezius muscle tension and tenderness bilaterally especially on the left side.  She has been working on shoulder joint range of motion exercises.  She uses Voltaren gel topically as needed for pain relief.  She has not been experiencing any muscle spasms recently. Patient had a recent office visit with Dr. Halford Harper on 06/12/22.  She has completed pulmonary rehab and has been using a smart vest twice daily.  She denies any recent bronchiectasis exacerbations or recent infections.  Patient reports that she had an updated annual flu shot and has had the first Shingrix vaccine and is planning on getting the second Shingrix vaccine dose in the next few weeks.   Activities of Daily Living:  Patient reports morning stiffness for 5-10 minutes.   Patient Denies nocturnal pain.  Difficulty dressing/grooming: Reports Difficulty climbing stairs: Reports Difficulty getting out of chair: Reports Difficulty using hands for taps, buttons, cutlery, and/or writing: Reports  Review of Systems  Constitutional:  Positive for fatigue.  HENT:  Positive for mouth sores and mouth dryness.  Negative for nose dryness.   Eyes:  Positive for dryness. Negative for pain and visual disturbance.  Respiratory:  Positive for cough and shortness of breath. Negative for hemoptysis and difficulty breathing.   Cardiovascular:  Positive for chest pain. Negative for palpitations, hypertension and swelling in legs/feet.  Gastrointestinal:  Positive for constipation and diarrhea. Negative for blood in stool.  Endocrine: Negative for increased urination.  Genitourinary:  Negative for painful urination.  Musculoskeletal:  Positive for joint pain, joint pain, joint swelling, muscle weakness and morning stiffness. Negative for myalgias, muscle tenderness and myalgias.  Skin:  Positive for color change and sensitivity to sunlight. Negative for pallor, rash, hair loss, nodules/bumps, skin tightness and ulcers.  Allergic/Immunologic: Positive for susceptible to infections.  Neurological:  Positive for dizziness, numbness, headaches and weakness.  Hematological:  Negative for swollen glands.  Psychiatric/Behavioral:  Negative for depressed mood and sleep disturbance. The patient is not nervous/anxious.     PMFS History:  Patient Active Problem List   Diagnosis Date Noted   Palpitations-related to MVP 04/02/2022   Nocturnal hypoxia 01/16/2022   Anxiety 09/06/2021   Tingling 06/28/2021   Occipital neuralgia of left side 01/06/2020   Bilateral carpal tunnel syndrome 07/21/2019   Costochondritis 03/16/2019   Rib pain on right side 03/16/2019   Prediabetes 11/27/2018   Fibromuscular dysplasia ([place]) 10/17/2018   Hypertension 10/17/2018   Bronchiectasis with (acute) exacerbation ([place]) 10/08/2018   OSA (obstructive sleep apnea) 08/20/2018   Rheumatoid arthritis ([place]) 04/17/2018  ILD (interstitial lung disease) ([place]) 04/17/2018   Dyspnea 04/01/2018   Bronchiectasis ([place]) 09/02/2017   LVH (left ventricular hypertrophy), mild 06/02/2017   Mitral regurgitation 05/22/2017   Jo-1 antibody positive  01/29/2017   Primary osteoarthritis of both feet 01/29/2017   Primary osteoarthritis of both hands 01/29/2017   Left shoulder tendonitis 01/17/2017   Vitamin D deficiency 04/26/2016   Cough 07/18/2015   MVP (mitral valve prolapse)    Erythema nodosum    INCONTINENCE, URGE 05/26/2010   Migraine headache 02/15/2010   Fibromyalgia 06/20/2009   Vertigo 03/05/2008   Polymyositis ([place]) 02/11/2008   BRONCHIECTASIS 02/10/2008    Past Medical History:  Diagnosis Date   Abnormal liver function test    Bronchiectasis        Dyspnea    Fibromuscular dysplasia (HCC)    Fibromyalgia    Heart murmur    ILD (interstitial lung disease) ([place])    Joint pain    Menorrhagia    MIGRAINE HEADACHE    MVP (mitral valve prolapse)    Osteoarthritis    Polymyositis (HCC)    Dr [name]; chronic MTX   Pulmonary fibrosis ([place])    Rheumatoid arthritis ([place])    Sleep apnea    Vertigo    Vitamin B 12 deficiency    Vitamin D deficiency     Family History  Problem Relation Age of Onset   Diabetes Mother    Fibromyalgia Mother    Ulcers Mother    Heart failure Mother    Thyroid disease Mother    Obesity Mother    Multiple myeloma Father    Hypertension Father    Stroke Father    Lupus Sister    Other Sister        abdominal adhesions resulting in bowel obstruction   Migraines Sister    Heart disease Brother        bypass surgery   Rheum arthritis Sister    Multiple myeloma Sister    Hypertension Sister    Heart attack Sister    Diabetes Sister    Hypertension Sister    Rheum arthritis Sister    Diabetes Sister    Diabetes Brother    Headache Other        siblings with headaches but not diagnosed as migraines   Past Surgical History:  Procedure Laterality Date   ablation uterine  2010   CARDIAC CATHETERIZATION  2002   normal   DILATION AND CURETTAGE OF UTERUS  08-31-08   Dr [name]   IR [place] SEL COM CAROTID INNOMINATE BILAT MOD SED  09/18/2018   IR  ANGIO VERTEBRAL SEL VERTEBRAL BILAT MOD SED  09/18/2018   IR US GUIDE VASC ACCESS RIGHT  09/18/2018   Social History   Social History Narrative   Exercise: trying to walk - limited by fatigue   Lives at home with husband    Right handed   Caffeine: none    Immunization History  Administered Date(s) Administered   Influenza Split 04/10/2011, 06/18/2012, 05/06/2013   Influenza Whole 05/19/2008, 05/15/2009, 06/01/2010   Influenza,inj,Quad PF,6+ Mos 04/20/2014, 05/26/2015, 05/31/2016, 04/05/2017, 06/16/2018, 04/22/2019, 04/22/2020   Influenza-Unspecified 04/11/2021   PFIZER(Purple Top)SARS-COV-2 Vaccination 10/27/2019, 11/19/2019   Pneumococcal Conjugate-13 01/29/2017   Pneumococcal Polysaccharide-23 08/07/2007, 06/16/2018   Td 07/12/2009   Tdap 01/06/2020   Unspecified SARS-COV-2 Vaccination 10/27/2019, 11/19/2019, 04/04/2021     Objective: Vital Signs: BP 126/77 (BP Location: Left Arm, Patient Position: Sitting, Cuff Size: Large)  Pulse 83   Resp 18   Ht 5' 8" (1.727 m)   Wt 294 lb 9.6 oz (133.6 kg)   BMI 44.79 kg/m    Physical Exam Vitals and nursing note reviewed.  Constitutional:      Appearance: She is well-developed.  HENT:     Head: Normocephalic and atraumatic.  Eyes:     Conjunctiva/sclera: Conjunctivae normal.  Cardiovascular:     Rate and Rhythm: Normal rate and regular rhythm.     Heart sounds: Normal heart sounds.  Pulmonary:     Effort: Pulmonary effort is normal.     Breath sounds: Normal breath sounds.  Abdominal:     General: Bowel sounds are normal.     Palpations: Abdomen is soft.  Musculoskeletal:     Cervical back: Normal range of motion.  Skin:    General: Skin is warm and dry.     Capillary Refill: Capillary refill takes less than 2 seconds.  Neurological:     Mental Status: She is alert and oriented to person, place, and time.  Psychiatric:        Behavior: Behavior normal.      Musculoskeletal Exam: C-spine has slightly limited ROM  with lateral rotation. Trapezius muscle tension and tenderness bilaterally, left > right.  Shoulder joints, elbow joints, and wrist joints have good ROM with no discomfort. No tenderness or synovitis of MCP joints.  Complete fist formation bilaterally.  Hip joints have good ROM with no groin pain.  Knee joints have good ROM with no warmth or effusion.  Ankle joints have good ROM with no tenderness or joint swelling.   CDAI Exam: CDAI Score: -- Patient Global: 4 mm; Provider Global: 4 mm Swollen: --; Tender: -- Joint Exam 07/11/2022   No joint exam has been documented for this visit   There is currently no information documented on the homunculus. Go to the Rheumatology activity and complete the homunculus joint exam.  Investigation: No additional findings.  Imaging: No results found.  Recent Labs: Lab Results  Component Value Date   WBC 8.1 05/22/2022   HGB 12.7 05/22/2022   PLT 269 05/22/2022   NA 141 05/22/2022   K 4.4 05/22/2022   CL 108 05/22/2022   CO2 26 05/22/2022   GLUCOSE 109 (H) 05/22/2022   BUN 10 05/22/2022   CREATININE 0.70 05/22/2022   BILITOT 0.4 05/22/2022   ALKPHOS 60 07/11/2021   AST 14 05/22/2022   ALT 14 05/22/2022   PROT 7.1 05/22/2022   ALBUMIN 4.1 07/11/2021   CALCIUM 9.3 05/22/2022   GFRAA 114 01/18/2021   QFTBGOLDPLUS NEGATIVE 02/14/2022    Speciality Comments: Prior therapy includes: MTX (d/c due to ILD progresssion)  Procedures:  No procedures performed Allergies: Sulfonamide derivatives       Assessment / Plan:     Visit Diagnoses: Rheumatoid arthritis involving multiple sites with positive rheumatoid factor (HCC) - +RF, +CCP: She has no synovitis on examination today.  She has not had any recent rheumatoid arthritis flares.  She has clinically been doing well on Enbrel 50 mg sq injections once weekly and Imuran 100 mg daily.  She is tolerating combination therapy without any side effects and has not missed any doses recently.  No  medication changes will be made at this time.  She was advised to notify us if she develops increased joint pain or joint swelling.  She will follow-up in the office in 3 months or sooner if needed.  Polymyositis ([place]) - Dx  by Dr. Charlestine Night 2007, CK 801, Jo-1 positive: She has not had any signs or symptoms of a polymyositis flare.  She has clinically been doing well on the current treatment regimen.  She is not experiencing any increased muscular weakness.  She had no difficulty rising from a seated position or raising her arms above her head today.  No obvious muscle weakness was noted. CK was within normal limits-54 on 02/14/2022.  CK should be monitored every 3 months.  Updated standing orders for CK were placed today. She will remain on the current treatment regimen.  She was advised to notify us if she develops signs or symptoms of a flare. - Plan: CK  High risk medication use - Enbrel 50 mg sq injections once weekly (02/18/20-1st injection) and Imuran 100 mg by mouth daily. d/c MTX due to progressioin of ILD. CBC and CMP were updated on 05/22/2022.  Her next lab work will be due in January and every 3 months to monitor for drug toxicity.  Standing orders for CBC and CMP remain in place. TB Gold negative on 02/14/2022 and will continue to be monitored yearly. Discussed the importance of holding Enbrel and Imuran if she develops signs or symptoms of an infection and to resume once the infection is completely cleared.  She voiced understanding. She has received the annual flu shot.  She has received the first Shingrix vaccine and is planning on receiving the second dose within the next few weeks.  She has not yet had the RSV vaccine but plans on reaching out to Dr. Halford Harper to discuss.    Jo-1 antibody positive  ILD (interstitial lung disease) (HCC) - Followed by [name], NP at [facility].  Completed pulmonary rehab.  Reviewed office visit note from 06/12/2022. Patient will remain on the  current treatment regimen.  Bronchiectasis without complication ([place]) - Followed by [facility]-Dr. Halford Harper.  She has been using a smart vest twice daily and has noticed improvement.  She has not had any recent infections or exacerbations.  Bilateral carpal tunnel syndrome: Asymptomatic currently.  She was diagnosed with ulnar neuropathy in the past.  Primary osteoarthritis of both hands: PIP and DIP thickening consistent with osteoarthritis of both hands.  No tenderness or inflammation noted on examination today.  Complete fist formation bilaterally.  Discussed the importance of joint protection and muscle strengthening.  Primary osteoarthritis of both feet: Both ankle joints have good range of motion with no tenderness or synovitis.  She is wearing proper fitting shoes.  Fibromyalgia: She has generalized hyperalgesia and positive tender points on examination.  She has been experiencing some increased myalgias with colder weather temperatures.  She remains on Cymbalta as prescribed and has a prescription for methocarbamol, which she takes as needed for muscle spasms.  Trapezius muscle spasm -She experiences intermittent trapezius muscle tension tenderness bilaterally.  She has had increased symptoms on the left side recently.  She has not been experiencing any muscle spasms.  She has a prescription for methocarbamol 500 mg 1 tablet daily as needed for muscle spasms.  She declined trigger point injections today.  She was advised to notify us if her symptoms persist or worsen.  Other fatigue: Stable.   Raynaud's syndrome without gangrene: She continues to experience intermittent symptoms of Raynaud's phenomenon especially in her feet.  Discussed the importance of keeping her core body temperature warm as well as wearing gloves and socks throughout the winter months.   No digital ulcerations or signs of gangrene were  noted.  No signs of sclerodactyly noted.  She continues to take aspirin 81 mg  daily.    Other medical conditions are listed as follows:   Fibromuscular dysplasia (HCC) - Multifocal fibromuscular dysplasia of the bilateral internal carotid arteries-confirmed by catheter-based angiography-Followed by Dr. Maudie Mercury.  Erythema nodosum - No recurrence.   History of mitral valve prolapse - Followed by Dr. Maudie Mercury.  History of migraine - Followed by Dr. Jaynee Eagles. Advised to avoid triptan medications due to history of FMD.  Vitamin D deficiency: She takes vitamin D 2000 units daily.   Orders: Orders Placed This Encounter  Procedures   CK   No orders of the defined types were placed in this encounter.     Follow-Up Instructions: Return in about 3 months (around 10/10/2022) for Polymyositis, ILD, Rheumatoid arthritis.   Ofilia Neas, PA-C  Note - This record has been created using Dragon software.  Chart creation errors have been sought, but may not always  have been located. Such creation errors do not reflect on  the standard of medical care.

## 2022-07-04 DIAGNOSIS — F410 Panic disorder [episodic paroxysmal anxiety] without agoraphobia: Secondary | ICD-10-CM | POA: Diagnosis not present

## 2022-07-11 ENCOUNTER — Encounter: Payer: Self-pay | Admitting: Physician Assistant

## 2022-07-11 ENCOUNTER — Ambulatory Visit: Payer: Federal, State, Local not specified - PPO | Attending: Physician Assistant | Admitting: Physician Assistant

## 2022-07-11 VITALS — BP 126/77 | HR 83 | Resp 18 | Ht 68.0 in | Wt 294.6 lb

## 2022-07-11 DIAGNOSIS — M19071 Primary osteoarthritis, right ankle and foot: Secondary | ICD-10-CM

## 2022-07-11 DIAGNOSIS — M19072 Primary osteoarthritis, left ankle and foot: Secondary | ICD-10-CM

## 2022-07-11 DIAGNOSIS — F410 Panic disorder [episodic paroxysmal anxiety] without agoraphobia: Secondary | ICD-10-CM | POA: Diagnosis not present

## 2022-07-11 DIAGNOSIS — J479 Bronchiectasis, uncomplicated: Secondary | ICD-10-CM

## 2022-07-11 DIAGNOSIS — M797 Fibromyalgia: Secondary | ICD-10-CM

## 2022-07-11 DIAGNOSIS — M0579 Rheumatoid arthritis with rheumatoid factor of multiple sites without organ or systems involvement: Secondary | ICD-10-CM | POA: Diagnosis not present

## 2022-07-11 DIAGNOSIS — M62838 Other muscle spasm: Secondary | ICD-10-CM

## 2022-07-11 DIAGNOSIS — R768 Other specified abnormal immunological findings in serum: Secondary | ICD-10-CM

## 2022-07-11 DIAGNOSIS — J849 Interstitial pulmonary disease, unspecified: Secondary | ICD-10-CM

## 2022-07-11 DIAGNOSIS — M19041 Primary osteoarthritis, right hand: Secondary | ICD-10-CM

## 2022-07-11 DIAGNOSIS — I7300 Raynaud's syndrome without gangrene: Secondary | ICD-10-CM

## 2022-07-11 DIAGNOSIS — L52 Erythema nodosum: Secondary | ICD-10-CM

## 2022-07-11 DIAGNOSIS — Z8669 Personal history of other diseases of the nervous system and sense organs: Secondary | ICD-10-CM

## 2022-07-11 DIAGNOSIS — M3320 Polymyositis, organ involvement unspecified: Secondary | ICD-10-CM

## 2022-07-11 DIAGNOSIS — Z8679 Personal history of other diseases of the circulatory system: Secondary | ICD-10-CM

## 2022-07-11 DIAGNOSIS — I773 Arterial fibromuscular dysplasia: Secondary | ICD-10-CM

## 2022-07-11 DIAGNOSIS — E559 Vitamin D deficiency, unspecified: Secondary | ICD-10-CM

## 2022-07-11 DIAGNOSIS — M19042 Primary osteoarthritis, left hand: Secondary | ICD-10-CM

## 2022-07-11 DIAGNOSIS — Z79899 Other long term (current) drug therapy: Secondary | ICD-10-CM | POA: Diagnosis not present

## 2022-07-11 DIAGNOSIS — G5603 Carpal tunnel syndrome, bilateral upper limbs: Secondary | ICD-10-CM

## 2022-07-11 DIAGNOSIS — R5383 Other fatigue: Secondary | ICD-10-CM

## 2022-07-11 NOTE — Patient Instructions (Addendum)
Standing Labs We placed an order today for your standing lab work.   Please have your standing labs drawn in January and every 3 months   Please have your labs drawn 2 weeks prior to your appointment so that the provider can discuss your lab results at your appointment.  Please note that you may see your imaging and lab results in [place] [place] before we have reviewed them. We will contact you once all results are reviewed. Please allow our office up to 72 hours to thoroughly review all of the results before contacting the office for clarification of your results.  Lab hours are:   Monday through Thursday from 8:00 am -12:30 pm and 1:00 pm-5:00 pm and Friday from 8:00 am-12:00 pm.  Please be advised, all patients with office appointments requiring lab work will take precedent over walk-in lab work.   Labs are drawn by [facility]. Please bring your co-pay at the time of your lab draw.  You may receive a bill from [facility] for your lab work.  Please note if you are on Hydroxychloroquine and and an order has been placed for a Hydroxychloroquine level, you will need to have it drawn 4 hours or more after your last dose.  If you wish to have your labs drawn at another location, please call the office 24 hours in advance so we can fax the orders.  The office is located at [address], [place], [place], [place] [address] No appointment is necessary.    If you have any questions regarding directions or hours of operation,  please call [phone].   As a reminder, please drink plenty of water prior to coming for your lab work. Thanks!  If you have signs or symptoms of an infection or start antibiotics: First, call your PCP for workup of your infection. Hold your medication through the infection, until you complete your antibiotics, and until symptoms resolve if you take the following: Injectable medication (Actemra, Benlysta, Cimzia, Cosentyx, Enbrel, Humira, Kevzara, Orencia, Remicade, Simponi,  Stelara, Taltz, Tremfya) Methotrexate Leflunomide (Arava) Mycophenolate (Cellcept) Morrie Sheldon, Olumiant, or Rinvoq   Vaccines You are taking a medication(s) that can suppress your immune system.  The following immunizations are recommended: Flu annually Covid-19  Td/Tdap (tetanus, diphtheria, pertussis) every 10 years Pneumonia (Prevnar 15 then Pneumovax 23 at least 1 year apart.  Alternatively, can take Prevnar 20 without needing additional dose) Shingrix: 2 doses from 4 weeks to 6 months apart  Please check with your PCP to make sure you are up to date.

## 2022-07-12 ENCOUNTER — Ambulatory Visit: Payer: Federal, State, Local not specified - PPO | Admitting: Physical Therapy

## 2022-07-13 ENCOUNTER — Ambulatory Visit: Payer: Federal, State, Local not specified - PPO | Attending: Internal Medicine

## 2022-07-13 DIAGNOSIS — R42 Dizziness and giddiness: Secondary | ICD-10-CM | POA: Insufficient documentation

## 2022-07-13 DIAGNOSIS — R2681 Unsteadiness on feet: Secondary | ICD-10-CM | POA: Insufficient documentation

## 2022-07-13 NOTE — Therapy (Signed)
OUTPATIENT PHYSICAL THERAPY VESTIBULAR EVALUATION     Patient Name: Jill Harper MRN: [id number] DOB:1966-02-16, 56 y.o., female Today's Date: 07/13/2022  END OF SESSION:  PT End of Session - 07/13/22 0828     Visit Number 1    Number of Visits 9    Date for PT Re-Evaluation 09/07/22    Authorization Type BCBS    PT Start Time 0845    PT Stop Time 0928    PT Time Calculation (min) 43 min             Past Medical History:  Diagnosis Date   Abnormal liver function test    Bronchiectasis        Dyspnea    Fibromuscular dysplasia ([place])    Fibromyalgia    Heart murmur    ILD (interstitial lung disease) ([place])    Joint pain    Menorrhagia    MIGRAINE HEADACHE    MVP (mitral valve prolapse)    Osteoarthritis    Polymyositis ([place])    Dr [name]; chronic MTX   Pulmonary fibrosis ([place])    Rheumatoid arthritis ([place])    Sleep apnea    Vertigo    Vitamin B 12 deficiency    Vitamin D deficiency    Past Surgical History:  Procedure Laterality Date   ablation uterine  2010   CARDIAC CATHETERIZATION  2002   normal   DILATION AND CURETTAGE OF UTERUS  08-31-08   Dr [name]   IR ANGIO INTRA EXTRACRAN SEL COM CAROTID INNOMINATE BILAT MOD SED  09/18/2018   IR ANGIO VERTEBRAL SEL VERTEBRAL BILAT MOD SED  09/18/2018   IR US GUIDE VASC ACCESS RIGHT  09/18/2018   Patient Active Problem List   Diagnosis Date Noted   Palpitations-related to MVP 04/02/2022   Nocturnal hypoxia 01/16/2022   Anxiety 09/06/2021   Tingling 06/28/2021   Occipital neuralgia of left side 01/06/2020   Bilateral carpal tunnel syndrome 07/21/2019   Costochondritis 03/16/2019   Rib pain on right side 03/16/2019   Prediabetes 11/27/2018   Fibromuscular dysplasia ([place]) 10/17/2018   Hypertension 10/17/2018   Bronchiectasis with (acute) exacerbation ([place]) 10/08/2018   OSA (obstructive sleep apnea) 08/20/2018   Rheumatoid arthritis ([place]) 04/17/2018   ILD (interstitial lung disease) ([place])  04/17/2018   Dyspnea 04/01/2018   Bronchiectasis ([place]) 09/02/2017   LVH (left ventricular hypertrophy), mild 06/02/2017   Mitral regurgitation 05/22/2017   Jo-1 antibody positive 01/29/2017   Primary osteoarthritis of both feet 01/29/2017   Primary osteoarthritis of both hands 01/29/2017   Left shoulder tendonitis 01/17/2017   Vitamin D deficiency 04/26/2016   Cough 07/18/2015   MVP (mitral valve prolapse)    Erythema nodosum    INCONTINENCE, URGE 05/26/2010   Migraine headache 02/15/2010   Fibromyalgia 06/20/2009   Vertigo 03/05/2008   Polymyositis ([place]) 02/11/2008   BRONCHIECTASIS 02/10/2008    PCP: Binnie Rail, MD REFERRING PROVIDER: Binnie Rail, MD  REFERRING DIAG: R42 (ICD-10-CM) - Vertigo  THERAPY DIAG:  Dizziness and giddiness - Plan: PT plan of care cert/re-cert  Unsteadiness on feet - Plan: PT plan of care cert/re-cert  ONSET DATE: 41/66/0630 referral  Rationale for Evaluation and Treatment: Rehabilitation  SUBJECTIVE:   SUBJECTIVE STATEMENT: Patient reports doing well. Did take meclizine last night at ~7pm. States she may have had vertigo a few years ago briefly. On her 5th episode this year for a few days at a time. Was washing her hair and had a lot of  head tilts and then experienced vertigo for a few days. Each bout of vertigo is lasting longer and is more intense. Leaning down and to the L seems to be a provoking factor. This bout has lasted ~5 weeks though intensity ebbs and flows. With increased intensity she does get a HA and has to take tylenol and lay down. HA is frontal and R. Each bout of vertigo she feels as though she's spinning- not objects around her moving. She has not fallen because of the vertigo, but has had multiple near falls. Sometimes has nausea with vertigo, no vomiting.  Pt accompanied by: self  PERTINENT HISTORY: vertigo many years ago (?)   PAIN:  Are you having pain? No  PRECAUTIONS: Fall  WEIGHT BEARING RESTRICTIONS:  No  FALLS: Has patient fallen in last 6 months? No; multiple near falls.   LIVING ENVIRONMENT: Lives with: lives with their spouse Lives in: House/apartment Stairs: Yes: External: 2 steps; none Has following equipment at home: Grab bars  PLOF: Independent  PATIENT GOALS: "how to navigate with the dizziness"   OBJECTIVE:   DIAGNOSTIC FINDINGS: 04/15/22: CT angio normal   COGNITION: Overall cognitive status: Within functional limits for tasks assessed   SENSATION: With onset of episodes will get L sided facial numbness   POSTURE:  No Significant postural limitations  Cervical ROM:   *all WFL and painfree Active A/PROM (deg) eval  Flexion   Extension   Right lateral flexion   Left lateral flexion   Right rotation   Left rotation   (Blank rows = not tested) VBI (-)   STRENGTH: WFL  BED MOBILITY:  Able to complete independently   TRANSFERS: Assistive device utilized: None  Sit to stand: Complete Independence Stand to sit: Complete Independence Chair to chair: Complete Independence  GAIT: Gait pattern: WFL Distance walked: CLINIC Assistive device utilized: None Level of assistance: Complete Independence  PATIENT SURVEYS:  FOTO did not capture   VESTIBULAR ASSESSMENT:  GENERAL OBSERVATION: NAD   SYMPTOM BEHAVIOR:  Subjective history: see above   Non-Vestibular symptoms: headaches, nausea/vomiting, and migraine symptoms  Type of dizziness: Unsteady with head/body turns and patient feels as though she is spinning  Frequency: with provoking movements, rarely with random onset  Duration: "waves" of increased intensity last ~15-20s  Aggravating factors: Spontaneous, Induced by position change: supine to sit, and Induced by motion: bending down to the ground, turning body quickly, turning head quickly, driving, and sitting in a moving car  Relieving factors: head stationary, lying supine, medication, and slow movements  Progression of symptoms:  unchanged  OCULOMOTOR EXAM:  Ocular Alignment: normal  Ocular ROM: No Limitations  Spontaneous Nystagmus: absent  Gaze-Induced Nystagmus: absent  Smooth Pursuits: intact  Saccades: intact  Convergence/Divergence: <5 cm    VESTIBULAR - OCULAR REFLEX:   Slow VOR: Normal  VOR Cancellation: Normal  Head-Impulse Test: HIT Right: negative HIT Left: negative  Dynamic Visual Acuity:  to be assessed   POSITIONAL TESTING: Right Dix-Hallpike: no nystagmus Left Dix-Hallpike: no nystagmus Right Roll Test: no nystagmus Left Roll Test: no nystagmus  MOTION SENSITIVITY:  Motion Sensitivity Quotient Intensity: 0 = none, 1 = Lightheaded, 2 = Mild, 3 = Moderate, 4 = Severe, 5 = Vomiting  Intensity  1. Sitting to supine 3  2. Supine to L side 0  3. Supine to R side 0  4. Supine to sitting 3  5. L Hallpike-Dix 3  6. Up from L  2  7. R Hallpike-Dix 1  8.  Up from R  1  9. Sitting, head tipped to L knee 0  10. Head up from L knee 2  11. Sitting, head tipped to R knee 0  12. Head up from R knee 1  13. Sitting head turns x5 0  14.Sitting head nods x5 1.5  15. In stance, 180 turn to L  2  16. In stance, 180 turn to R 0    VESTIBULAR TREATMENT:                                                                                                   N/A eval    PATIENT EDUCATION: Education details: PT POC, exam findings Person educated: Patient Education method: Explanation Education comprehension: verbalized understanding  HOME EXERCISE PROGRAM:  GOALS: Goals reviewed with patient? Yes  SHORT TERM GOALS: = LTG based on POC length  LONG TERM GOALS: Target date: 08/10/22  Pt will be independent with final HEP for reduction in symptoms Baseline: to be provided Goal status: INITIAL  2.  Pt will report </= 1/5 for all movements on MSQ to indicate improvement in motion sensitivity and improved activity tolerance.  Baseline: 3/5 Goal status: INITIAL  3.  DVA to be assessed Baseline:   Goal status: INITIAL  4.  Patient will demonstrate (-) positional testing to indicate resolution of BPPV  Baseline: to be re-assessed Goal status: INITIAL  5.  FOTO goal Baseline: to be assessed Goal status: INITIAL   ASSESSMENT:  CLINICAL IMPRESSION: Patient is a 56 y.o. female who was seen today for physical therapy evaluation and treatment for dizziness.  Patient with increased motion sensitivity with movements biased to the L, though HIT and VOR were normal. Patient with motion sensitivity in L dix hallpike- but no observable nystagmus. Patient did take meclizine in the past 12 hours, which may mask nystagmus and severity of symptoms. She would benefit from skilled PT services to address the above mentioned deficits.   OBJECTIVE IMPAIRMENTS: decreased balance, decreased knowledge of condition, and dizziness.   ACTIVITY LIMITATIONS: bending, squatting, stairs, bed mobility, bathing, hygiene/grooming, and caring for others  PARTICIPATION LIMITATIONS: meal prep, cleaning, laundry, interpersonal relationship, driving, shopping, and community activity  PERSONAL FACTORS: Age, Past/current experiences, Sex, Time since onset of injury/illness/exacerbation, Transportation, and 1-2 comorbidities: HTN, RA  are also affecting patient's functional outcome.   REHAB POTENTIAL: Good  CLINICAL DECISION MAKING: Evolving/moderate complexity  EVALUATION COMPLEXITY: Moderate   PLAN:  PT FREQUENCY: 2x/week  PT DURATION: 4 weeks  PLANNED INTERVENTIONS: Therapeutic exercises, Therapeutic activity, Neuromuscular re-education, Balance training, Gait training, Patient/Family education, Self Care, Joint mobilization, Stair training, Vestibular training, Canalith repositioning, Visual/preceptual remediation/compensation, and Re-evaluation  PLAN FOR NEXT SESSION: DVA, FOTO, HEP, reassess positional testing if she hasn't taken meclizine   Debbora Dus, PT Debbora Dus, PT, DPT,  CBIS  07/13/2022, 11:26 AM

## 2022-07-17 ENCOUNTER — Encounter: Payer: Self-pay | Admitting: Physical Therapy

## 2022-07-17 ENCOUNTER — Ambulatory Visit: Payer: Federal, State, Local not specified - PPO | Admitting: Physical Therapy

## 2022-07-17 DIAGNOSIS — R2681 Unsteadiness on feet: Secondary | ICD-10-CM

## 2022-07-17 DIAGNOSIS — R42 Dizziness and giddiness: Secondary | ICD-10-CM

## 2022-07-17 NOTE — Therapy (Signed)
OUTPATIENT PHYSICAL THERAPY VESTIBULAR TREATMENT     Patient Name: Jill Harper MRN: [id number] DOB:1966-01-31, 56 y.o., female Today's Date: 07/17/2022  END OF SESSION:  PT End of Session - 07/17/22 0850     Visit Number 2    Number of Visits 9    Date for PT Re-Evaluation 09/07/22    Authorization Type BCBS    PT Start Time 0849    PT Stop Time 0931    PT Time Calculation (min) 42 min    Activity Tolerance Patient tolerated treatment well    Behavior During Therapy WFL for tasks assessed/performed             Past Medical History:  Diagnosis Date   Abnormal liver function test    Bronchiectasis        Dyspnea    Fibromuscular dysplasia ([place])    Fibromyalgia    Heart murmur    ILD (interstitial lung disease) ([place])    Joint pain    Menorrhagia    MIGRAINE HEADACHE    MVP (mitral valve prolapse)    Osteoarthritis    Polymyositis ([place])    Dr [name]; chronic MTX   Pulmonary fibrosis ([place])    Rheumatoid arthritis ([place])    Sleep apnea    Vertigo    Vitamin B 12 deficiency    Vitamin D deficiency    Past Surgical History:  Procedure Laterality Date   ablation uterine  2010   CARDIAC CATHETERIZATION  2002   normal   DILATION AND CURETTAGE OF UTERUS  08-31-08   Dr [name]   IR ANGIO INTRA EXTRACRAN SEL COM CAROTID INNOMINATE BILAT MOD SED  09/18/2018   IR ANGIO VERTEBRAL SEL VERTEBRAL BILAT MOD SED  09/18/2018   IR US GUIDE VASC ACCESS RIGHT  09/18/2018   Patient Active Problem List   Diagnosis Date Noted   Palpitations-related to MVP 04/02/2022   Nocturnal hypoxia 01/16/2022   Anxiety 09/06/2021   Tingling 06/28/2021   Occipital neuralgia of left side 01/06/2020   Bilateral carpal tunnel syndrome 07/21/2019   Costochondritis 03/16/2019   Rib pain on right side 03/16/2019   Prediabetes 11/27/2018   Fibromuscular dysplasia ([place]) 10/17/2018   Hypertension 10/17/2018   Bronchiectasis with (acute) exacerbation ([place]) 10/08/2018   OSA  (obstructive sleep apnea) 08/20/2018   Rheumatoid arthritis ([place]) 04/17/2018   ILD (interstitial lung disease) ([place]) 04/17/2018   Dyspnea 04/01/2018   Bronchiectasis ([place]) 09/02/2017   LVH (left ventricular hypertrophy), mild 06/02/2017   Mitral regurgitation 05/22/2017   Jo-1 antibody positive 01/29/2017   Primary osteoarthritis of both feet 01/29/2017   Primary osteoarthritis of both hands 01/29/2017   Left shoulder tendonitis 01/17/2017   Vitamin D deficiency 04/26/2016   Cough 07/18/2015   MVP (mitral valve prolapse)    Erythema nodosum    INCONTINENCE, URGE 05/26/2010   Migraine headache 02/15/2010   Fibromyalgia 06/20/2009   Vertigo 03/05/2008   Polymyositis ([place]) 02/11/2008   BRONCHIECTASIS 02/10/2008    PCP: Binnie Rail, MD REFERRING PROVIDER: Binnie Rail, MD  REFERRING DIAG: R42 (ICD-10-CM) - Vertigo  THERAPY DIAG:  Dizziness and giddiness  Unsteadiness on feet  ONSET DATE: 06/27/2022 referral  Rationale for Evaluation and Treatment: Rehabilitation  SUBJECTIVE:   SUBJECTIVE STATEMENT:  Feels like her dizziness is starting to subside. Overall, feeling like the episodes are less intense. Was looking in the closet for boots and was leaning straight over. When she came up was very disoriented. Felt like things  were spinning and she was feeling off balance. This episode lasted <1 minute. Sometimes will get a random wave of dizziness when sitting still. Normally it is when she leans down to the L side. Last time she took her Meclizine was a couple days ago (Sunday night).  Pt accompanied by: self  PERTINENT HISTORY: vertigo many years ago (?)   PAIN:  Are you having pain? No  PRECAUTIONS: Fall  WEIGHT BEARING RESTRICTIONS: No  FALLS: Has patient fallen in last 6 months? No; multiple near falls.   LIVING ENVIRONMENT: Lives with: lives with their spouse Lives in: House/apartment Stairs: Yes: External: 2 steps; none Has following equipment at home:  Grab bars  PLOF: Independent  PATIENT GOALS: "how to navigate with the dizziness"   OBJECTIVE:    VESTIBULAR ASSESSMENT:  VESTIBULAR - OCULAR REFLEX:    Dynamic Visual Acuity: Static: Line 5, Dynamic: Line 3 (pt was not wearing her contacts)  Pt with mild unsteadiness and dizziness afterwards.    POSITIONAL TESTING: Right Dix-Hallpike: no nystagmus Left Dix-Hallpike: no nystagmus and feeling a little unsteady, slight dizziness, pt not symptomatic like she was at eval.  Right Roll Test: no nystagmus and no dizziness  Left Roll Test: no nystagmus Right Sidelying: no nystagmus and no dizziness, feels a little lightheaded  Left Sidelying: no nystagmus and no dizziness   With L roll test when bringing pt's head back to center, pt feeling a little off balance, no nystagmus.     VESTIBULAR TREATMENT:                                                                                                    Gaze Adaptation: x1 Viewing Horizontal: Position: Seated, Time: 30, 60 seconds, Reps: 2, and Comment: No dizziness in sitting. Performed in standing 2x30 seconds, felt a little off balance  and x1 Viewing Vertical:  Position: Standing, Time: 30 seconds, Reps: 2, and Comment: didn't feel quite as steady with up and down, started about halfway through    Access Code: [email or web address] URL: [email or web address] Date: 07/17/2022 Prepared by: Janann August  Initiated HEP for balance for vestibular input   Exercises - Romberg Stance Eyes Closed on Foam Pad  - 1-2 x daily - 5 x weekly - 3 sets - 30 hold - Romberg Stance with Head Nods on Foam Pad  - 1 x daily - 5 x weekly - 2 sets - 10 reps and 10 reps head turns, pt more unsteady with head nods    PATIENT EDUCATION: Education details: Vestibular assessment findings, initial HEP  Person educated: Patient Education method: Explanation, Demonstration, and Handouts Education comprehension: verbalized understanding and returned  demonstration  HOME EXERCISE PROGRAM: Standing VOR x30 seconds, [email or web address]   GOALS: Goals reviewed with patient? Yes  SHORT TERM GOALS: = LTG based on POC length  LONG TERM GOALS: Target date: 08/10/22  Pt will be independent with final HEP for reduction in symptoms Baseline: to be provided Goal status: INITIAL  2.  Pt will report </= 1/5 for all movements on MSQ to indicate improvement in motion sensitivity and  improved activity tolerance.  Baseline: 3/5 Goal status: INITIAL  3.  Pt will perform DVA in 1 line difference or less with no dizziness in order to demo improved VOR.  Baseline: 2 line difference and mild dizziness/unsteadiness  Goal status: INITIAL  4.  Patient will demonstrate (-) positional testing to indicate resolution of BPPV  Baseline: negative positional testing on 07/17/22 Goal status: MET  5.  FOTO goal Baseline: to be assessed Goal status: INITIAL   ASSESSMENT:  CLINICAL IMPRESSION:  Re-assessed positional testing today now that pt was not on Meclizine. Pt having no nystagmus in any position. Pt having some dizziness with L Dix-Hallpike position, but felt like it was also more of an imbalance. This was an improvement since eval where pt had moderate dizziness. Assessed DVA today (pt was not wearing her contacts), with pt having a 2 line difference and having mild dizziness/unsteadiness. Initiated HEP today for standing VOR and balance for incr vestibular input. Pt had no dizziness with tasks, just had more unsteadiness, esp with head nods. Will continue to progress towards LTGs.    OBJECTIVE IMPAIRMENTS: decreased balance, decreased knowledge of condition, and dizziness.   ACTIVITY LIMITATIONS: bending, squatting, stairs, bed mobility, bathing, hygiene/grooming, and caring for others  PARTICIPATION LIMITATIONS: meal prep, cleaning, laundry, interpersonal relationship, driving, shopping, and community activity  PERSONAL FACTORS: Age, Past/current  experiences, Sex, Time since onset of injury/illness/exacerbation, Transportation, and 1-2 comorbidities: HTN, RA  are also affecting patient's functional outcome.   REHAB POTENTIAL: Good  CLINICAL DECISION MAKING: Evolving/moderate complexity  EVALUATION COMPLEXITY: Moderate   PLAN:  PT FREQUENCY: 2x/week  PT DURATION: 4 weeks  PLANNED INTERVENTIONS: Therapeutic exercises, Therapeutic activity, Neuromuscular re-education, Balance training, Gait training, Patient/Family education, Self Care, Joint mobilization, Stair training, Vestibular training, Canalith repositioning, Visual/preceptual remediation/compensation, and Re-evaluation  PLAN FOR NEXT SESSION: progress VOR, work on [employer], balance for vestibular input   Janann August, PT, DPT 07/17/22 10:23 AM

## 2022-07-17 NOTE — Patient Instructions (Signed)
Gaze Stabilization: Standing Feet Apart    Feet shoulder width apart, keeping eyes on target on wall a few feet away, tilt head down 15-30 and move head side to side for _30___ seconds. Repeat while moving head up and down for ___30_ seconds. Perform 3 reps of each    Do __2__ sessions per day   Copyright  VHI. All rights reserved.

## 2022-07-18 DIAGNOSIS — F410 Panic disorder [episodic paroxysmal anxiety] without agoraphobia: Secondary | ICD-10-CM | POA: Diagnosis not present

## 2022-07-19 DIAGNOSIS — G4733 Obstructive sleep apnea (adult) (pediatric): Secondary | ICD-10-CM | POA: Diagnosis not present

## 2022-07-19 DIAGNOSIS — J471 Bronchiectasis with (acute) exacerbation: Secondary | ICD-10-CM | POA: Diagnosis not present

## 2022-07-20 ENCOUNTER — Encounter: Payer: Self-pay | Admitting: Physical Therapy

## 2022-07-20 ENCOUNTER — Ambulatory Visit: Payer: Federal, State, Local not specified - PPO | Admitting: Physical Therapy

## 2022-07-20 VITALS — BP 124/71 | HR 82

## 2022-07-20 DIAGNOSIS — R2681 Unsteadiness on feet: Secondary | ICD-10-CM | POA: Diagnosis not present

## 2022-07-20 DIAGNOSIS — R42 Dizziness and giddiness: Secondary | ICD-10-CM

## 2022-07-20 NOTE — Therapy (Signed)
OUTPATIENT PHYSICAL THERAPY VESTIBULAR TREATMENT     Patient Name: Jill Harper MRN: [id number] DOB:September 09, 1965, 56 y.o., female Today's Date: 07/20/2022  END OF SESSION:  PT End of Session - 07/20/22 0804     Visit Number 3    Number of Visits 9    Date for PT Re-Evaluation 09/07/22    Authorization Type BCBS    PT Start Time 0803    PT Stop Time 4332    PT Time Calculation (min) 40 min    Activity Tolerance Patient tolerated treatment well    Behavior During Therapy WFL for tasks assessed/performed             Past Medical History:  Diagnosis Date   Abnormal liver function test    Bronchiectasis        Dyspnea    Fibromuscular dysplasia ([place])    Fibromyalgia    Heart murmur    ILD (interstitial lung disease) ([place])    Joint pain    Menorrhagia    MIGRAINE HEADACHE    MVP (mitral valve prolapse)    Osteoarthritis    Polymyositis ([place])    Dr [name]; chronic MTX   Pulmonary fibrosis ([place])    Rheumatoid arthritis ([place])    Sleep apnea    Vertigo    Vitamin B 12 deficiency    Vitamin D deficiency    Past Surgical History:  Procedure Laterality Date   ablation uterine  2010   CARDIAC CATHETERIZATION  2002   normal   DILATION AND CURETTAGE OF UTERUS  08-31-08   Dr [name]   IR ANGIO INTRA EXTRACRAN SEL COM CAROTID INNOMINATE BILAT MOD SED  09/18/2018   IR ANGIO VERTEBRAL SEL VERTEBRAL BILAT MOD SED  09/18/2018   IR US GUIDE VASC ACCESS RIGHT  09/18/2018   Patient Active Problem List   Diagnosis Date Noted   Palpitations-related to MVP 04/02/2022   Nocturnal hypoxia 01/16/2022   Anxiety 09/06/2021   Tingling 06/28/2021   Occipital neuralgia of left side 01/06/2020   Bilateral carpal tunnel syndrome 07/21/2019   Costochondritis 03/16/2019   Rib pain on right side 03/16/2019   Prediabetes 11/27/2018   Fibromuscular dysplasia ([place]) 10/17/2018   Hypertension 10/17/2018   Bronchiectasis with (acute) exacerbation ([place]) 10/08/2018   OSA  (obstructive sleep apnea) 08/20/2018   Rheumatoid arthritis ([place]) 04/17/2018   ILD (interstitial lung disease) ([place]) 04/17/2018   Dyspnea 04/01/2018   Bronchiectasis ([place]) 09/02/2017   LVH (left ventricular hypertrophy), mild 06/02/2017   Mitral regurgitation 05/22/2017   Jo-1 antibody positive 01/29/2017   Primary osteoarthritis of both feet 01/29/2017   Primary osteoarthritis of both hands 01/29/2017   Left shoulder tendonitis 01/17/2017   Vitamin D deficiency 04/26/2016   Cough 07/18/2015   MVP (mitral valve prolapse)    Erythema nodosum    INCONTINENCE, URGE 05/26/2010   Migraine headache 02/15/2010   Fibromyalgia 06/20/2009   Vertigo 03/05/2008   Polymyositis ([place]) 02/11/2008   BRONCHIECTASIS 02/10/2008    PCP: Binnie Rail, MD REFERRING PROVIDER: Binnie Rail, MD  REFERRING DIAG: R42 (ICD-10-CM) - Vertigo  THERAPY DIAG:  Dizziness and giddiness  Unsteadiness on feet  ONSET DATE: 06/27/2022 referral  Rationale for Evaluation and Treatment: Rehabilitation  SUBJECTIVE:   SUBJECTIVE STATEMENT:  When hanging up her towel on the rack today, had a slight wave of dizziness when looking up and holding head in place for a little bit. Did not feel a spinning sensation, just felt a wave of  off balance. Tried the exercises at home and they went well. Had a couple other episodes that did not last long. These happened more when bending over. Was getting a plug out of the wall and was leaning over. When she was coming back up, felt the unsteadiness and had to steady herself on the wall. The other episode was when she was bending over to put clothes away in the bottom dresser. Has not taken any Meclizine since.   Pt accompanied by: self  PERTINENT HISTORY: vertigo many years ago (?)   PAIN:  Are you having pain? No  Vitals:   07/20/22 0827  BP: 124/71  Pulse: 82     PRECAUTIONS: Fall  WEIGHT BEARING RESTRICTIONS: No  FALLS: Has patient fallen in last 6 months?  No; multiple near falls.   LIVING ENVIRONMENT: Lives with: lives with their spouse Lives in: House/apartment Stairs: Yes: External: 2 steps; none Has following equipment at home: Grab bars  PLOF: Independent  PATIENT GOALS: "how to navigate with the dizziness"   OBJECTIVE:    VESTIBULAR TREATMENT:                                                                                                     Habituation: Bending/Reaching to the Floor: comment: Performed in sitting first with 6 cones, 2 sets, pt unsteady when going down to the L, mild dizziness and a slight headache. Reports some facial tingling. Symptoms subsided after a little rest break. Performed in standing with 6 cones, 3 sets, performing slowly first and then trying to go faster. No symptoms other than pt's ears feeling a little clogged, but no dizziness. Cues for proper squat position.  With 10 smaller cones on a table, grabbing cone and then performing a 180 degree turn to reach and put cone on highest shelf and look up, and then grabbing cone and then turning around and putting it back on table. Pt mildly unsteady when going to the R, and mild dizziness/unsteadiness when going to the L. Had a little headache going to the L. Gradually incr the speed with turns.  On blue foam beam: lateral reaching and looking up x12 reps each side and holding it, pt with mild unsteadiness just from standing on a compliant surface. Pt did well with looking up.    Gaze Adaptation: x1 Viewing Horizontal: Position: Standing, Time: 30, 60 seconds, Reps: 2, and Comment: No dizziness/off balance  and x1 Viewing Vertical:  Position: Standing, Time: 30 seconds, 60 seconds, Reps: 2, and Comment: No symptoms.   Performed an additional 60 seconds of each direction standing on air ex, mild imbalance esp with head nods  PATIENT EDUCATION: Education details: Progressing VOR to standing on foam and 60 seconds, verbally added 180 degree turns to HEP  Person  educated: Patient Education method: Explanation, Demonstration, and Handouts Education comprehension: verbalized understanding and returned demonstration  HOME EXERCISE PROGRAM: Standing VOR x60 seconds on pillows/foam  180 degree turns   Access Code: [email or web address] URL: [email or web address] Date: 07/17/2022 Prepared by: Janann August  Initiated HEP for balance for vestibular input  Exercises - Romberg Stance Eyes Closed on Foam Pad  - 1-2 x daily - 5 x weekly - 3 sets - 30 hold - Romberg Stance with Head Nods on Foam Pad  - 1 x daily - 5 x weekly - 2 sets - 10 reps and 10 reps head turns, pt more unsteady with head nods    GOALS: Goals reviewed with patient? Yes  SHORT TERM GOALS: = LTG based on POC length  LONG TERM GOALS: Target date: 08/10/22  Pt will be independent with final HEP for reduction in symptoms Baseline: to be provided Goal status: INITIAL  2.  Pt will report </= 1/5 for all movements on MSQ to indicate improvement in motion sensitivity and improved activity tolerance.  Baseline: 3/5 Goal status: INITIAL  3.  Pt will perform DVA in 1 line difference or less with no dizziness in order to demo improved VOR.  Baseline: 2 line difference and mild dizziness/unsteadiness  Goal status: INITIAL  4.  Patient will demonstrate (-) positional testing to indicate resolution of BPPV  Baseline: negative positional testing on 07/17/22 Goal status: MET  5.  FOTO goal Baseline: to be assessed Goal status: INITIAL   ASSESSMENT:  CLINICAL IMPRESSION:  Today's skilled session focused on habituation exercises to address bending, looking up, turning, and also progressed standing VOR to being on a compliant surface. Pt with mild unsteadiness when standing on foam with VOR, but not dizziness. Pt only with symptoms when bending over to the L during the first set in sitting, but none other symptoms with bending over. Pt feeling more unsteady when turning to the L.  Added 180 degree turns to HEP. Overall pt tolerated session well. Will continue to progress towards LTGs.    OBJECTIVE IMPAIRMENTS: decreased balance, decreased knowledge of condition, and dizziness.   ACTIVITY LIMITATIONS: bending, squatting, stairs, bed mobility, bathing, hygiene/grooming, and caring for others  PARTICIPATION LIMITATIONS: meal prep, cleaning, laundry, interpersonal relationship, driving, shopping, and community activity  PERSONAL FACTORS: Age, Past/current experiences, Sex, Time since onset of injury/illness/exacerbation, Transportation, and 1-2 comorbidities: HTN, RA  are also affecting patient's functional outcome.   REHAB POTENTIAL: Good  CLINICAL DECISION MAKING: Evolving/moderate complexity  EVALUATION COMPLEXITY: Moderate   PLAN:  PT FREQUENCY: 2x/week  PT DURATION: 4 weeks  PLANNED INTERVENTIONS: Therapeutic exercises, Therapeutic activity, Neuromuscular re-education, Balance training, Gait training, Patient/Family education, Self Care, Joint mobilization, Stair training, Vestibular training, Canalith repositioning, Visual/preceptual remediation/compensation, and Re-evaluation  PLAN FOR NEXT SESSION: progress VOR, work on [employer], balance for vestibular input. Work on head motions, turning.   Janann August, PT, DPT 07/20/22 8:45 AM

## 2022-07-21 ENCOUNTER — Other Ambulatory Visit: Payer: Self-pay | Admitting: Internal Medicine

## 2022-07-24 ENCOUNTER — Ambulatory Visit: Payer: Federal, State, Local not specified - PPO | Admitting: Physical Therapy

## 2022-07-24 ENCOUNTER — Encounter: Payer: Self-pay | Admitting: Physical Therapy

## 2022-07-24 DIAGNOSIS — R42 Dizziness and giddiness: Secondary | ICD-10-CM | POA: Diagnosis not present

## 2022-07-24 DIAGNOSIS — R2681 Unsteadiness on feet: Secondary | ICD-10-CM

## 2022-07-24 NOTE — Therapy (Signed)
OUTPATIENT PHYSICAL THERAPY VESTIBULAR TREATMENT     Patient Name: Jill Harper MRN: [id number] DOB:08-28-1965, 56 y.o., female Today's Date: 07/24/2022  END OF SESSION:  PT End of Session - 07/24/22 0848     Visit Number 4    Number of Visits 9    Date for PT Re-Evaluation 09/07/22    Authorization Type BCBS    PT Start Time 0847    PT Stop Time 0928    PT Time Calculation (min) 41 min    Activity Tolerance Patient tolerated treatment well    Behavior During Therapy WFL for tasks assessed/performed              Past Medical History:  Diagnosis Date   Abnormal liver function test    Bronchiectasis        Dyspnea    Fibromuscular dysplasia ([place])    Fibromyalgia    Heart murmur    ILD (interstitial lung disease) ([place])    Joint pain    Menorrhagia    MIGRAINE HEADACHE    MVP (mitral valve prolapse)    Osteoarthritis    Polymyositis ([place])    Dr [name]; chronic MTX   Pulmonary fibrosis ([place])    Rheumatoid arthritis ([place])    Sleep apnea    Vertigo    Vitamin B 12 deficiency    Vitamin D deficiency    Past Surgical History:  Procedure Laterality Date   ablation uterine  2010   CARDIAC CATHETERIZATION  2002   normal   DILATION AND CURETTAGE OF UTERUS  08-31-08   Dr [name]   IR ANGIO INTRA EXTRACRAN SEL COM CAROTID INNOMINATE BILAT MOD SED  09/18/2018   IR ANGIO VERTEBRAL SEL VERTEBRAL BILAT MOD SED  09/18/2018   IR US GUIDE VASC ACCESS RIGHT  09/18/2018   Patient Active Problem List   Diagnosis Date Noted   Palpitations-related to MVP 04/02/2022   Nocturnal hypoxia 01/16/2022   Anxiety 09/06/2021   Tingling 06/28/2021   Occipital neuralgia of left side 01/06/2020   Bilateral carpal tunnel syndrome 07/21/2019   Costochondritis 03/16/2019   Rib pain on right side 03/16/2019   Prediabetes 11/27/2018   Fibromuscular dysplasia ([place]) 10/17/2018   Hypertension 10/17/2018   Bronchiectasis with (acute) exacerbation ([place]) 10/08/2018   OSA  (obstructive sleep apnea) 08/20/2018   Rheumatoid arthritis ([place]) 04/17/2018   ILD (interstitial lung disease) ([place]) 04/17/2018   Dyspnea 04/01/2018   Bronchiectasis ([place]) 09/02/2017   LVH (left ventricular hypertrophy), mild 06/02/2017   Mitral regurgitation 05/22/2017   Jo-1 antibody positive 01/29/2017   Primary osteoarthritis of both feet 01/29/2017   Primary osteoarthritis of both hands 01/29/2017   Left shoulder tendonitis 01/17/2017   Vitamin D deficiency 04/26/2016   Cough 07/18/2015   MVP (mitral valve prolapse)    Erythema nodosum    INCONTINENCE, URGE 05/26/2010   Migraine headache 02/15/2010   Fibromyalgia 06/20/2009   Vertigo 03/05/2008   Polymyositis ([place]) 02/11/2008   BRONCHIECTASIS 02/10/2008    PCP: Binnie Rail, MD REFERRING PROVIDER: Binnie Rail, MD  REFERRING DIAG: R42 (ICD-10-CM) - Vertigo  THERAPY DIAG:  Dizziness and giddiness  Unsteadiness on feet  ONSET DATE: 06/27/2022 referral  Rationale for Evaluation and Treatment: Rehabilitation  SUBJECTIVE:   SUBJECTIVE STATEMENT:  Had an episode of dizziness on Friday night after moving from room to room. All of a sudden a feeling like a wave hit her and felt like she was spinning. When she got into bed, reached  up to the headboard and felt like she was spinning and it last less than a minute. Repots it felt like a wave, like she was on a boat and bobbing around. Had to take a Meclizine. Hasn't had any issues since Friday evening. Had no issues any of the other times.   Pt accompanied by: self  PERTINENT HISTORY: vertigo many years ago (?)   PAIN:  Are you having pain? No  There were no vitals filed for this visit.    PRECAUTIONS: Fall  WEIGHT BEARING RESTRICTIONS: No  FALLS: Has patient fallen in last 6 months? No; multiple near falls.   LIVING ENVIRONMENT: Lives with: lives with their spouse Lives in: House/apartment Stairs: Yes: External: 2 steps; none Has following equipment at  home: Grab bars  PLOF: Independent  PATIENT GOALS: "how to navigate with the dizziness"   OBJECTIVE:   VESTIBULAR ASSESSMENT  POSITIONAL TESTING: Right Dix-Hallpike: no nystagmus Left Dix-Hallpike: no nystagmus Right Roll Test: no nystagmus Left Roll Test: no nystagmus Right Sidelying: no nystagmus Left Sidelying: no nystagmus  Pt with no dizziness in any position, just some mild dizziness/imbalance when coming upright  VESTIBULAR TREATMENT:                                                                                                     Habituation: Brandt-Daroff: number of reps: 3  Performed due to feeling symptomatic when coming upright from L/R sidelying positions. Pt with improved symptoms and reporting no dizziness or unsteadiness coming up for the 3rd rep.    Gaze Adaptation: x1 Viewing Horizontal: Position: Standing feet apart on air ex> closer together, Time: 60 seconds, Reps: 2, and Comment: More unsteadiness with feet closer together  and x1 Viewing Vertical:  Position: Standing with feet apart on air ex> feet closer together, Time: 60 seconds, Reps: 2, and Comment: Mild unsteadiness    For improved gaze stabilization during gait: Holding ball and making circles with tracking with head/eyes: 3 x 20' CW and 3 x 30' CCW circles ambulating forwards, pt more challenged in the CW direction and had some initial unsteadiness with decr step length and pt almost losing balance, but able to maintain. Pt improved with incr reps.  115' forward gait with vertical ball toss with tracking with head/eyes   On rockerboard in A/P direction: EO: 10 reps head turns, 2 x 10 reps head nods, pt with more postural sway with vertical head movements  EC: holding board still x30 seconds, rocking board x30 seconds, x5 reps head turns, x5 reps head nods, pt needing intermittent taps to bars for balance esp wit head motions    PATIENT EDUCATION: Education details: Adding brandt daroff for home  for habituation, continued to educate/discuss on purpose of habituation exercises for vestibular system.  Person educated: Patient Education method: Explanation, Demonstration, and Handouts Education comprehension: verbalized understanding and returned demonstration  HOME EXERCISE PROGRAM: Standing VOR x60 seconds on pillows/foam (with feet closer together) 180 degree turns  [employer]  Access Code: [email or web address] URL: [email or web address] Date: 07/17/2022 Prepared by: Janann August  Initiated HEP for  balance for vestibular input   Exercises - Romberg Stance Eyes Closed on Foam Pad  - 1-2 x daily - 5 x weekly - 3 sets - 30 hold - Romberg Stance with Head Nods on Foam Pad  - 1 x daily - 5 x weekly - 2 sets - 10 reps and 10 reps head turns, pt more unsteady with head nods    GOALS: Goals reviewed with patient? Yes  SHORT TERM GOALS: = LTG based on POC length  LONG TERM GOALS: Target date: 08/10/22  Pt will be independent with final HEP for reduction in symptoms Baseline: to be provided Goal status: INITIAL  2.  Pt will report </= 1/5 for all movements on MSQ to indicate improvement in motion sensitivity and improved activity tolerance.  Baseline: 3/5 Goal status: INITIAL  3.  Pt will perform DVA in 1 line difference or less with no dizziness in order to demo improved VOR.  Baseline: 2 line difference and mild dizziness/unsteadiness  Goal status: INITIAL  4.  Patient will demonstrate (-) positional testing to indicate resolution of BPPV  Baseline: negative positional testing on 07/17/22 Goal status: MET  5.  FOTO goal Baseline: to be assessed Goal status: INITIAL   ASSESSMENT:  CLINICAL IMPRESSION:  Re-assessed positional testing today due to pt reporting some spinning over the weekend and when looking up to reach towards her headboard that subsided in less than a minute. Positional testing was negative, pt only mildly symptomatic when coming upright to  sitting. Performed Nestor Lewandowsky exercises with pt reporting improved symptoms after 3 reps. Added to HEP. Remainder of session focused on progressing VOR exercises and balance for vestibular input. Pt challenged by having to track ball with head/eyes during gait in CW circles and initially more unsteady, but pt improved with incr reps. Will continue to progress towards LTGs.    OBJECTIVE IMPAIRMENTS: decreased balance, decreased knowledge of condition, and dizziness.   ACTIVITY LIMITATIONS: bending, squatting, stairs, bed mobility, bathing, hygiene/grooming, and caring for others  PARTICIPATION LIMITATIONS: meal prep, cleaning, laundry, interpersonal relationship, driving, shopping, and community activity  PERSONAL FACTORS: Age, Past/current experiences, Sex, Time since onset of injury/illness/exacerbation, Transportation, and 1-2 comorbidities: HTN, RA  are also affecting patient's functional outcome.   REHAB POTENTIAL: Good  CLINICAL DECISION MAKING: Evolving/moderate complexity  EVALUATION COMPLEXITY: Moderate   PLAN:  PT FREQUENCY: 2x/week  PT DURATION: 4 weeks  PLANNED INTERVENTIONS: Therapeutic exercises, Therapeutic activity, Neuromuscular re-education, Balance training, Gait training, Patient/Family education, Self Care, Joint mobilization, Stair training, Vestibular training, Canalith repositioning, Visual/preceptual remediation/compensation, and Re-evaluation  PLAN FOR NEXT SESSION: progress VOR  with a busy background, work on [employer], balance for vestibular input. Work on head motions, turning, gaze stabilization    Janann August, PT, DPT 07/24/22 11:53 AM

## 2022-07-25 DIAGNOSIS — F410 Panic disorder [episodic paroxysmal anxiety] without agoraphobia: Secondary | ICD-10-CM | POA: Diagnosis not present

## 2022-07-27 ENCOUNTER — Ambulatory Visit: Payer: Federal, State, Local not specified - PPO

## 2022-07-27 DIAGNOSIS — R42 Dizziness and giddiness: Secondary | ICD-10-CM

## 2022-07-27 DIAGNOSIS — R2681 Unsteadiness on feet: Secondary | ICD-10-CM

## 2022-07-27 NOTE — Therapy (Signed)
OUTPATIENT PHYSICAL THERAPY VESTIBULAR TREATMENT     Patient Name: Jill Harper MRN: [id number] DOB:[birth year], 56 y.o., female Today's Date: 07/27/2022  END OF SESSION:  PT End of Session - 07/27/22 0846     Visit Number 5    Number of Visits 9    Date for PT Re-Evaluation 09/07/22    Authorization Type BCBS    PT Start Time 0847    PT Stop Time 0927    PT Time Calculation (min) 40 min    Activity Tolerance Patient tolerated treatment well    Behavior During Therapy WFL for tasks assessed/performed              Past Medical History:  Diagnosis Date   Abnormal liver function test    Bronchiectasis        Dyspnea    Fibromuscular dysplasia ([place])    Fibromyalgia    Heart murmur    ILD (interstitial lung disease) ([place])    Joint pain    Menorrhagia    MIGRAINE HEADACHE    MVP (mitral valve prolapse)    Osteoarthritis    Polymyositis ([place])    Dr [name]; chronic MTX   Pulmonary fibrosis ([place])    Rheumatoid arthritis ([place])    Sleep apnea    Vertigo    Vitamin B 12 deficiency    Vitamin D deficiency    Past Surgical History:  Procedure Laterality Date   ablation uterine  2010   CARDIAC CATHETERIZATION  2002   normal   DILATION AND CURETTAGE OF UTERUS  08-31-08   Dr [name]   IR ANGIO INTRA EXTRACRAN SEL COM CAROTID INNOMINATE BILAT MOD SED  09/18/2018   IR ANGIO VERTEBRAL SEL VERTEBRAL BILAT MOD SED  09/18/2018   IR US GUIDE VASC ACCESS RIGHT  09/18/2018   Patient Active Problem List   Diagnosis Date Noted   Palpitations-related to MVP 04/02/2022   Nocturnal hypoxia 01/16/2022   Anxiety 09/06/2021   Tingling 06/28/2021   Occipital neuralgia of left side 01/06/2020   Bilateral carpal tunnel syndrome 07/21/2019   Costochondritis 03/16/2019   Rib pain on right side 03/16/2019   Prediabetes 11/27/2018   Fibromuscular dysplasia ([place]) 10/17/2018   Hypertension 10/17/2018   Bronchiectasis with (acute) exacerbation ([place]) 10/08/2018   OSA  (obstructive sleep apnea) 08/20/2018   Rheumatoid arthritis ([place]) 04/17/2018   ILD (interstitial lung disease) ([place]) 04/17/2018   Dyspnea 04/01/2018   Bronchiectasis ([place]) 09/02/2017   LVH (left ventricular hypertrophy), mild 06/02/2017   Mitral regurgitation 05/22/2017   Jo-1 antibody positive 01/29/2017   Primary osteoarthritis of both feet 01/29/2017   Primary osteoarthritis of both hands 01/29/2017   Left shoulder tendonitis 01/17/2017   Vitamin D deficiency 04/26/2016   Cough 07/18/2015   MVP (mitral valve prolapse)    Erythema nodosum    INCONTINENCE, URGE 05/26/2010   Migraine headache 02/15/2010   Fibromyalgia 06/20/2009   Vertigo 03/05/2008   Polymyositis ([place]) 02/11/2008   BRONCHIECTASIS 02/10/2008    PCP: Binnie Rail, MD REFERRING PROVIDER: Binnie Rail, MD  REFERRING DIAG: R42 (ICD-10-CM) - Vertigo  THERAPY DIAG:  Dizziness and giddiness  Unsteadiness on feet  ONSET DATE: 06/27/2022 referral  Rationale for Evaluation and Treatment: Rehabilitation  SUBJECTIVE:   SUBJECTIVE STATEMENT:  Has not had another episode of dizziness since last reported. Didn't feel safe doing the balance exercises at home alone and so didn't do them. Denies falls/ near falls.   Pt accompanied by: self  PERTINENT  HISTORY: vertigo many years ago (?)   PAIN:  Are you having pain? No  There were no vitals filed for this visit.   PRECAUTIONS: Fall  PATIENT GOALS: "how to navigate with the dizziness"   VESTIBULAR TREATMENT:                                                                                                   -115' ball toss visual tracking  -115' ball CW, 115' ball CCW visual tracking  -VOR x1 pattern background horizontal x45s, vertical x45s -> standing on Airex 2x45s each direction   -increased mild instability with vertical direction  -smooth pursuit puzzle with pattern background with noted difficulty  -standing in corner [place] chart with trunk twist    -increased difficulty with L and inferior direction  -habituation bending down to L and back up  -initial rep with mild dizziness, then resolved  PATIENT EDUCATION: Education details: continue HEP Person educated: Patient Education method: Consulting civil engineer, Demonstration, and Handouts Education comprehension: verbalized understanding and returned demonstration  HOME EXERCISE PROGRAM: Standing VOR x60 seconds on pillows/foam (with feet closer together) 180 degree turns  [employer] Code: [email or web address] URL: [email or web address] Date: 07/17/2022 Prepared by: Janann August  Initiated HEP for balance for vestibular input   Exercises - Romberg Stance Eyes Closed on Foam Pad  - 1-2 x daily - 5 x weekly - 3 sets - 30 hold - Romberg Stance with Head Nods on Foam Pad  - 1 x daily - 5 x weekly - 2 sets - 10 reps and 10 reps head turns, pt more unsteady with head nods    GOALS: Goals reviewed with patient? Yes  SHORT TERM GOALS: = LTG based on POC length  LONG TERM GOALS: Target date: 08/10/22  Pt will be independent with final HEP for reduction in symptoms Baseline: to be provided Goal status: INITIAL  2.  Pt will report </= 1/5 for all movements on MSQ to indicate improvement in motion sensitivity and improved activity tolerance.  Baseline: 3/5 Goal status: INITIAL  3.  Pt will perform DVA in 1 line difference or less with no dizziness in order to demo improved VOR.  Baseline: 2 line difference and mild dizziness/unsteadiness  Goal status: INITIAL  4.  Patient will demonstrate (-) positional testing to indicate resolution of BPPV  Baseline: negative positional testing on 07/17/22 Goal status: MET  5.  FOTO goal Baseline: to be assessed Goal status: INITIAL   ASSESSMENT:  CLINICAL IMPRESSION: Patient seen for skilled PT session with emphasis on vestibular retraining. Tolerating progression in exercises very well with very minimal increase in her  dizziness. Baseline reported at 0/5 this session. Patient noting some difficulty with smooth pursuit puzzle with mild increase in dizziness. Remains with mild flair of symptoms with down and to the L movements/visual tracking when completing [facility] Chart. Continue POC.    OBJECTIVE IMPAIRMENTS: decreased balance, decreased knowledge of condition, and dizziness.   ACTIVITY LIMITATIONS: bending, squatting, stairs, bed mobility, bathing, hygiene/grooming, and caring for others  PARTICIPATION LIMITATIONS: meal prep, cleaning, laundry, interpersonal relationship, driving, shopping,  and community activity  PERSONAL FACTORS: Age, Past/current experiences, Sex, Time since onset of injury/illness/exacerbation, Transportation, and 1-2 comorbidities: HTN, RA  are also affecting patient's functional outcome.   REHAB POTENTIAL: Good  CLINICAL DECISION MAKING: Evolving/moderate complexity  EVALUATION COMPLEXITY: Moderate   PLAN:  PT FREQUENCY: 2x/week  PT DURATION: 4 weeks  PLANNED INTERVENTIONS: Therapeutic exercises, Therapeutic activity, Neuromuscular re-education, Balance training, Gait training, Patient/Family education, Self Care, Joint mobilization, Stair training, Vestibular training, Canalith repositioning, Visual/preceptual remediation/compensation, and Re-evaluation  PLAN FOR NEXT SESSION: progress VOR  with a busy background, work on [employer], balance for vestibular input. Work on head motions, turning, gaze stabilization    Debbora Dus, PT, DPT, CBIS 07/27/22 10:42 AM

## 2022-08-01 ENCOUNTER — Ambulatory Visit: Payer: Federal, State, Local not specified - PPO

## 2022-08-01 DIAGNOSIS — R2681 Unsteadiness on feet: Secondary | ICD-10-CM

## 2022-08-01 DIAGNOSIS — R42 Dizziness and giddiness: Secondary | ICD-10-CM

## 2022-08-01 NOTE — Therapy (Signed)
OUTPATIENT PHYSICAL THERAPY VESTIBULAR TREATMENT     Patient Name: Jill Harper MRN: [id number] DOB:[birth year], 56 y.o., female Today's Date: 08/01/2022  END OF SESSION:  PT End of Session - 08/01/22 0756     Visit Number 6    Number of Visits 9    Date for PT Re-Evaluation 09/07/22    Authorization Type BCBS    PT Start Time 0800    PT Stop Time 0843    PT Time Calculation (min) 43 min    Activity Tolerance Patient tolerated treatment well    Behavior During Therapy WFL for tasks assessed/performed              Past Medical History:  Diagnosis Date   Abnormal liver function test    Bronchiectasis        Dyspnea    Fibromuscular dysplasia ([place])    Fibromyalgia    Heart murmur    ILD (interstitial lung disease) ([place])    Joint pain    Menorrhagia    MIGRAINE HEADACHE    MVP (mitral valve prolapse)    Osteoarthritis    Polymyositis ([place])    Dr [name]; chronic MTX   Pulmonary fibrosis ([place])    Rheumatoid arthritis ([place])    Sleep apnea    Vertigo    Vitamin B 12 deficiency    Vitamin D deficiency    Past Surgical History:  Procedure Laterality Date   ablation uterine  2010   CARDIAC CATHETERIZATION  2002   normal   DILATION AND CURETTAGE OF UTERUS  08-31-08   Dr [name]   IR ANGIO INTRA EXTRACRAN SEL COM CAROTID INNOMINATE BILAT MOD SED  09/18/2018   IR ANGIO VERTEBRAL SEL VERTEBRAL BILAT MOD SED  09/18/2018   IR US GUIDE VASC ACCESS RIGHT  09/18/2018   Patient Active Problem List   Diagnosis Date Noted   Palpitations-related to MVP 04/02/2022   Nocturnal hypoxia 01/16/2022   Anxiety 09/06/2021   Tingling 06/28/2021   Occipital neuralgia of left side 01/06/2020   Bilateral carpal tunnel syndrome 07/21/2019   Costochondritis 03/16/2019   Rib pain on right side 03/16/2019   Prediabetes 11/27/2018   Fibromuscular dysplasia ([place]) 10/17/2018   Hypertension 10/17/2018   Bronchiectasis with (acute) exacerbation ([place]) 10/08/2018   OSA  (obstructive sleep apnea) 08/20/2018   Rheumatoid arthritis ([place]) 04/17/2018   ILD (interstitial lung disease) ([place]) 04/17/2018   Dyspnea 04/01/2018   Bronchiectasis ([place]) 09/02/2017   LVH (left ventricular hypertrophy), mild 06/02/2017   Mitral regurgitation 05/22/2017   Jo-1 antibody positive 01/29/2017   Primary osteoarthritis of both feet 01/29/2017   Primary osteoarthritis of both hands 01/29/2017   Left shoulder tendonitis 01/17/2017   Vitamin D deficiency 04/26/2016   Cough 07/18/2015   MVP (mitral valve prolapse)    Erythema nodosum    INCONTINENCE, URGE 05/26/2010   Migraine headache 02/15/2010   Fibromyalgia 06/20/2009   Vertigo 03/05/2008   Polymyositis ([place]) 02/11/2008   BRONCHIECTASIS 02/10/2008    PCP: Binnie Rail, MD REFERRING PROVIDER: Binnie Rail, MD  REFERRING DIAG: R42 (ICD-10-CM) - Vertigo  THERAPY DIAG:  Dizziness and giddiness  Unsteadiness on feet  ONSET DATE: 06/27/2022 referral  Rationale for Evaluation and Treatment: Rehabilitation  SUBJECTIVE:   SUBJECTIVE STATEMENT: Patient reports doing well. Has only had 1 bout of dizziness at a friends house when turning back over her L shoulder, no fall. Husband wanting her to use a SPC. PT discussing using in the community and  not in the home.   Pt accompanied by: self  PERTINENT HISTORY: vertigo many years ago (?)   PAIN:  Are you having pain? No  There were no vitals filed for this visit.   PRECAUTIONS: Fall  PATIENT GOALS: "how to navigate with the dizziness"   VESTIBULAR TREATMENT:                                                                                                   -in // bars: turning back over shoulder to retrieve cone alternating sides   -> standing on Airex   -> standing on rockerboard A/P  -in // bars: standing on rockerboard A/P turning back over shoulder to read playing cards  -standing in corner on rockerboard A/P reading Elnoria Howard chart biased at L diagonal   -blaze pods biased to the L random tapping (77 hits, 90 hits, 80 hits)  -blaze pods biased to the L with distracting colors (13 hits)   -reported slight increase in dizziness due to increased need for scanning   PATIENT EDUCATION: Education details: continue HEP Person educated: Patient Education method: Explanation, Demonstration, and Handouts Education comprehension: verbalized understanding and returned demonstration  HOME EXERCISE PROGRAM: Standing VOR x60 seconds on pillows/foam (with feet closer together) 180 degree turns  [employer]  Access Code: [email or web address] URL: [email or web address] Date: 07/17/2022 Prepared by: Janann August  Initiated HEP for balance for vestibular input   Exercises - Romberg Stance Eyes Closed on Foam Pad  - 1-2 x daily - 5 x weekly - 3 sets - 30 hold - Romberg Stance with Head Nods on Foam Pad  - 1 x daily - 5 x weekly - 2 sets - 10 reps and 10 reps head turns, pt more unsteady with head nods    GOALS: Goals reviewed with patient? Yes  SHORT TERM GOALS: = LTG based on POC length  LONG TERM GOALS: Target date: 08/10/22  Pt will be independent with final HEP for reduction in symptoms Baseline: to be provided Goal status: INITIAL  2.  Pt will report </= 1/5 for all movements on MSQ to indicate improvement in motion sensitivity and improved activity tolerance.  Baseline: 3/5 Goal status: INITIAL  3.  Pt will perform DVA in 1 line difference or less with no dizziness in order to demo improved VOR.  Baseline: 2 line difference and mild dizziness/unsteadiness  Goal status: INITIAL  4.  Patient will demonstrate (-) positional testing to indicate resolution of BPPV  Baseline: negative positional testing on 07/17/22 Goal status: MET  5.  FOTO goal Baseline: to be assessed Goal status: INITIAL   ASSESSMENT:  CLINICAL IMPRESSION: Patient seen for skilled PT session with emphasis on vestibular retraining. Patient tolerating  balance task progressions well with only mild LOB with turning over L shoulder. Patient able to regain balance independently. Reporting improvement in L visual field when doing Elnoria Howard Chart on L diagonal. Continue POC.    OBJECTIVE IMPAIRMENTS: decreased balance, decreased knowledge of condition, and dizziness.   ACTIVITY LIMITATIONS: bending, squatting, stairs, bed mobility, bathing, hygiene/grooming, and caring for others  PARTICIPATION  LIMITATIONS: meal prep, cleaning, laundry, interpersonal relationship, driving, shopping, and community activity  PERSONAL FACTORS: Age, Past/current experiences, Sex, Time since onset of injury/illness/exacerbation, Transportation, and 1-2 comorbidities: HTN, RA  are also affecting patient's functional outcome.   REHAB POTENTIAL: Good  CLINICAL DECISION MAKING: Evolving/moderate complexity  EVALUATION COMPLEXITY: Moderate   PLAN:  PT FREQUENCY: 2x/week  PT DURATION: 4 weeks  PLANNED INTERVENTIONS: Therapeutic exercises, Therapeutic activity, Neuromuscular re-education, Balance training, Gait training, Patient/Family education, Self Care, Joint mobilization, Stair training, Vestibular training, Canalith repositioning, Visual/preceptual remediation/compensation, and Re-evaluation  PLAN FOR NEXT SESSION: progress VOR  with a busy background, work on [employer], balance for vestibular input. Work on head motions, turning, gaze stabilization, blaze pods with distracting colors  Debbora Dus, PT, DPT, CBIS 08/01/22 8:45 AM

## 2022-08-02 DIAGNOSIS — F410 Panic disorder [episodic paroxysmal anxiety] without agoraphobia: Secondary | ICD-10-CM | POA: Diagnosis not present

## 2022-08-03 ENCOUNTER — Ambulatory Visit: Payer: Federal, State, Local not specified - PPO

## 2022-08-08 ENCOUNTER — Encounter: Payer: Self-pay | Admitting: Physical Therapy

## 2022-08-08 ENCOUNTER — Ambulatory Visit: Payer: Federal, State, Local not specified - PPO | Attending: Internal Medicine | Admitting: Physical Therapy

## 2022-08-08 DIAGNOSIS — R2681 Unsteadiness on feet: Secondary | ICD-10-CM | POA: Diagnosis not present

## 2022-08-08 DIAGNOSIS — R42 Dizziness and giddiness: Secondary | ICD-10-CM

## 2022-08-08 DIAGNOSIS — F410 Panic disorder [episodic paroxysmal anxiety] without agoraphobia: Secondary | ICD-10-CM | POA: Diagnosis not present

## 2022-08-08 NOTE — Therapy (Signed)
OUTPATIENT PHYSICAL THERAPY VESTIBULAR TREATMENT     Patient Name: Jill Harper MRN: [id number] DOB:[birth year], 57 y.o., female Today's Date: 08/08/2022  END OF SESSION:  PT End of Session - 08/08/22 0804     Visit Number 7    Number of Visits 9    Date for PT Re-Evaluation 09/07/22    Authorization Type BCBS    PT Start Time 0803    PT Stop Time 0845    PT Time Calculation (min) 42 min    Activity Tolerance Patient tolerated treatment well    Behavior During Therapy WFL for tasks assessed/performed              Past Medical History:  Diagnosis Date   Abnormal liver function test    Bronchiectasis        Dyspnea    Fibromuscular dysplasia ([place])    Fibromyalgia    Heart murmur    ILD (interstitial lung disease) ([place])    Joint pain    Menorrhagia    MIGRAINE HEADACHE    MVP (mitral valve prolapse)    Osteoarthritis    Polymyositis ([place])    Dr [name]; chronic MTX   Pulmonary fibrosis ([place])    Rheumatoid arthritis ([place])    Sleep apnea    Vertigo    Vitamin B 12 deficiency    Vitamin D deficiency    Past Surgical History:  Procedure Laterality Date   ablation uterine  2010   CARDIAC CATHETERIZATION  2002   normal   DILATION AND CURETTAGE OF UTERUS  08-31-08   Dr [name]   IR ANGIO INTRA EXTRACRAN SEL COM CAROTID INNOMINATE BILAT MOD SED  09/18/2018   IR ANGIO VERTEBRAL SEL VERTEBRAL BILAT MOD SED  09/18/2018   IR US GUIDE VASC ACCESS RIGHT  09/18/2018   Patient Active Problem List   Diagnosis Date Noted   Palpitations-related to MVP 04/02/2022   Nocturnal hypoxia 01/16/2022   Anxiety 09/06/2021   Tingling 06/28/2021   Occipital neuralgia of left side 01/06/2020   Bilateral carpal tunnel syndrome 07/21/2019   Costochondritis 03/16/2019   Rib pain on right side 03/16/2019   Prediabetes 11/27/2018   Fibromuscular dysplasia ([place]) 10/17/2018   Hypertension 10/17/2018   Bronchiectasis with (acute) exacerbation ([place]) 10/08/2018   OSA  (obstructive sleep apnea) 08/20/2018   Rheumatoid arthritis ([place]) 04/17/2018   ILD (interstitial lung disease) ([place]) 04/17/2018   Dyspnea 04/01/2018   Bronchiectasis ([place]) 09/02/2017   LVH (left ventricular hypertrophy), mild 06/02/2017   Mitral regurgitation 05/22/2017   Jo-1 antibody positive 01/29/2017   Primary osteoarthritis of both feet 01/29/2017   Primary osteoarthritis of both hands 01/29/2017   Left shoulder tendonitis 01/17/2017   Vitamin D deficiency 04/26/2016   Cough 07/18/2015   MVP (mitral valve prolapse)    Erythema nodosum    INCONTINENCE, URGE 05/26/2010   Migraine headache 02/15/2010   Fibromyalgia 06/20/2009   Vertigo 03/05/2008   Polymyositis ([place]) 02/11/2008   BRONCHIECTASIS 02/10/2008    PCP: Binnie Rail, MD REFERRING PROVIDER: Binnie Rail, MD  REFERRING DIAG: R42 (ICD-10-CM) - Vertigo  THERAPY DIAG:  Dizziness and giddiness  Unsteadiness on feet  ONSET DATE: 06/27/2022 referral  Rationale for Evaluation and Treatment: Rehabilitation  SUBJECTIVE:   SUBJECTIVE STATEMENT: Still feeling unsteady over unlevel surfaces and with turns. Feels like her balance is continuing to improve. Reports that she has had fewer episodes and that they are farther apart. When they happen, able to get balance quicker.  Pt accompanied by: self  PERTINENT HISTORY: vertigo many years ago (?)   PAIN:  Are you having pain? No  There were no vitals filed for this visit.   PRECAUTIONS: Fall  PATIENT GOALS: "how to navigate with the dizziness"   VESTIBULAR TREATMENT:      MOTION SENSITIVITY:            Motion Sensitivity Quotient Intensity: 0 = none, 1 = Lightheaded, 2 = Mild, 3 = Moderate, 4 = Severe, 5 = Vomiting   Intensity  1. Sitting to supine 0  2. Supine to L side 0  3. Supine to R side 0  4. Supine to sitting 0  5. L Hallpike-Dix 0  6. Up from L  0  7. R Hallpike-Dix 0  8. Up from R  0  9. Sitting, head tipped to L knee 0  10. Head up  from L knee 0  11. Sitting, head tipped to R knee 0  12. Head up from R knee 0  13. Sitting head turns x5 0  14.Sitting head nods x5 0  15. In stance, 180 turn to L  0  16. In stance, 180 turn to R .5                                                                                      Forward gait over 10-15' with quick 180 degree turns and then stopping, performed approx. 8 reps each direction. Pt with a couple instances of unsteadiness going towards the L, but improved with incr reps. No dizziness noted.  Forward gait with turning and handing ball to therapist (multi-directions for additional visual scanning when having to grab ball), performed over 345', going to R and L, pt has most difficulty when having to retrieve ball from therapist when going to L, feels like she has a harder time finding it going that way. Pt felt like she has a disconnect and had trouble at times getting the ball. No dizziness, just some unsteadiness going to the L.   Standing Balance: Surface: [place] Position: Feet Hip Width Apart Completed with: Eyes Closed;   10 reps head turns, 10 reps head nods, pt more unsteady with head nods.   2 sets of 10 reps gentle diagonal head movements each direction. pt with incr difficulty with looking up to the L and initially felt stuck in the L diagonal position (did not report any dizziness or lightheadedness), but improved with incr reps. Pt having less unsteadiness with more practice and pt reporting feeling better with diagonal head movements.    Pt with mild unsteadiness/postural sway, but was able to demonstrate improvement with incr reps.   Gaze Adaptation: x1 Viewing Horizontal: Position: Standing with feet close together with busy background, Time: 60 seconds, Reps: 1, and Comment: Mild sx of unsteadiness  and x1 Viewing Vertical:  Position: Standing with feet close together with busy background, Time: 60 seconds, Reps: 1, and Comment: Mild unsteadiness           PATIENT EDUCATION: Education details: Results of MSQ, additions to HEP (EC with head motions on unlevel surface) and progressing VOR to a busy  background (pt reports she has already tried this at home, discussed for more of a challenge can stand on an unlevel surface) Person educated: Patient Education method: Explanation, Demonstration, and Handouts Education comprehension: verbalized understanding and returned demonstration  HOME EXERCISE PROGRAM: Standing VOR x60 seconds on pillows/foam (with feet closer together) and adding in patterned background  180 degree turns  [employer] Code: [email or web address] URL: [email or web address] Date: 07/17/2022 Prepared by: Janann August  Initiated HEP for balance for vestibular input   Exercises - Romberg Stance Eyes Closed on Foam Pad  - 1-2 x daily - 5 x weekly - 3 sets - 30 hold - Romberg Stance with Head Nods on Foam Pad  - 1 x daily - 5 x weekly - 2 sets - 10 reps and 10 reps head turns, pt more unsteady with head nods  On unlevel surface: EC 10 reps head turns, nods, diagonals each direction    GOALS: Goals reviewed with patient? Yes  SHORT TERM GOALS: = LTG based on POC length  LONG TERM GOALS: Target date: 08/10/22  Pt will be independent with final HEP for reduction in symptoms Baseline: to be provided Goal status: INITIAL  2.  Pt will report </= 1/5 for all movements on MSQ to indicate improvement in motion sensitivity and improved activity tolerance.  Baseline: 3/5, met on 08/08/22  Goal status: MET  3.  Pt will perform DVA in 1 line difference or less with no dizziness in order to demo improved VOR.  Baseline: 2 line difference and mild dizziness/unsteadiness  Goal status: INITIAL  4.  Patient will demonstrate (-) positional testing to indicate resolution of BPPV  Baseline: negative positional testing on 07/17/22 Goal status: MET  5.  FOTO goal Baseline: to be assessed Goal status:  INITIAL   ASSESSMENT:  CLINICAL IMPRESSION: Today's skilled session focused on beginning to assess pt's LTGs. Pt met LTG #2 in regards to MSQ. Pt scoring 0 on all items with the exception of R turn with pt only reporting 0-1 symptoms. Worked on 180 degree turns with walking with pt having no dizziness, just some unsteadiness when turning to the L. Began to progress pt's HEP with adding a busy background to VOR standing exercises and EC with head motions. Pt with mild postural sway and incr unsteadiness esp with diagonals (looking up to the L and down to the R). However, pt initially more unsteady and feeling "stuck" in a L diagonal head position (no dizziness/lightheadedness), but with incr reps, pt's balance did improve and pt had less postural sway. Pt to trial these exercises at home. Will continue to progress towards LTGs.   OBJECTIVE IMPAIRMENTS: decreased balance, decreased knowledge of condition, and dizziness.   ACTIVITY LIMITATIONS: bending, squatting, stairs, bed mobility, bathing, hygiene/grooming, and caring for others  PARTICIPATION LIMITATIONS: meal prep, cleaning, laundry, interpersonal relationship, driving, shopping, and community activity  PERSONAL FACTORS: Age, Past/current experiences, Sex, Time since onset of injury/illness/exacerbation, Transportation, and 1-2 comorbidities: HTN, RA  are also affecting patient's functional outcome.   REHAB POTENTIAL: Good  CLINICAL DECISION MAKING: Evolving/moderate complexity  EVALUATION COMPLEXITY: Moderate   PLAN:  PT FREQUENCY: 2x/week  PT DURATION: 4 weeks  PLANNED INTERVENTIONS: Therapeutic exercises, Therapeutic activity, Neuromuscular re-education, Balance training, Gait training, Patient/Family education, Self Care, Joint mobilization, Stair training, Vestibular training, Canalith repositioning, Visual/preceptual remediation/compensation, and Re-evaluation  PLAN FOR NEXT SESSION: check remainder of goals, re-cert? Vs.  D/C? How did balance with EC and diagonal motions go at home? blaze pods  with distracting colors. Turning/looking over to the L.   Janann August, PT, DPT 08/08/22 9:35 AM

## 2022-08-09 ENCOUNTER — Ambulatory Visit: Payer: Federal, State, Local not specified - PPO | Admitting: Adult Health

## 2022-08-10 ENCOUNTER — Ambulatory Visit: Payer: Federal, State, Local not specified - PPO

## 2022-08-10 DIAGNOSIS — R2681 Unsteadiness on feet: Secondary | ICD-10-CM

## 2022-08-10 DIAGNOSIS — R42 Dizziness and giddiness: Secondary | ICD-10-CM | POA: Diagnosis not present

## 2022-08-10 NOTE — Therapy (Signed)
OUTPATIENT PHYSICAL THERAPY VESTIBULAR TREATMENT     Patient Name: Jill Harper MRN: [id number] DOB:02/21/1966, 57 y.o., female Today's Date: 08/10/2022  END OF SESSION:  PT End of Session - 08/10/22 0755     Visit Number 8    Number of Visits 9    Date for PT Re-Evaluation 09/07/22    Authorization Type BCBS    PT Start Time 0801    PT Stop Time 5366    PT Time Calculation (min) 42 min    Activity Tolerance Patient tolerated treatment well    Behavior During Therapy WFL for tasks assessed/performed              Past Medical History:  Diagnosis Date   Abnormal liver function test    Bronchiectasis        Dyspnea    Fibromuscular dysplasia ([place])    Fibromyalgia    Heart murmur    ILD (interstitial lung disease) ([place])    Joint pain    Menorrhagia    MIGRAINE HEADACHE    MVP (mitral valve prolapse)    Osteoarthritis    Polymyositis ([place])    Dr [name]; chronic MTX   Pulmonary fibrosis ([place])    Rheumatoid arthritis ([place])    Sleep apnea    Vertigo    Vitamin B 12 deficiency    Vitamin D deficiency    Past Surgical History:  Procedure Laterality Date   ablation uterine  2010   CARDIAC CATHETERIZATION  2002   normal   DILATION AND CURETTAGE OF UTERUS  08-31-08   Dr [name]   IR ANGIO INTRA EXTRACRAN SEL COM CAROTID INNOMINATE BILAT MOD SED  09/18/2018   IR ANGIO VERTEBRAL SEL VERTEBRAL BILAT MOD SED  09/18/2018   IR US GUIDE VASC ACCESS RIGHT  09/18/2018   Patient Active Problem List   Diagnosis Date Noted   Palpitations-related to MVP 04/02/2022   Nocturnal hypoxia 01/16/2022   Anxiety 09/06/2021   Tingling 06/28/2021   Occipital neuralgia of left side 01/06/2020   Bilateral carpal tunnel syndrome 07/21/2019   Costochondritis 03/16/2019   Rib pain on right side 03/16/2019   Prediabetes 11/27/2018   Fibromuscular dysplasia ([place]) 10/17/2018   Hypertension 10/17/2018   Bronchiectasis with (acute) exacerbation ([place]) 10/08/2018   OSA  (obstructive sleep apnea) 08/20/2018   Rheumatoid arthritis ([place]) 04/17/2018   ILD (interstitial lung disease) ([place]) 04/17/2018   Dyspnea 04/01/2018   Bronchiectasis ([place]) 09/02/2017   LVH (left ventricular hypertrophy), mild 06/02/2017   Mitral regurgitation 05/22/2017   Jo-1 antibody positive 01/29/2017   Primary osteoarthritis of both feet 01/29/2017   Primary osteoarthritis of both hands 01/29/2017   Left shoulder tendonitis 01/17/2017   Vitamin D deficiency 04/26/2016   Cough 07/18/2015   MVP (mitral valve prolapse)    Erythema nodosum    INCONTINENCE, URGE 05/26/2010   Migraine headache 02/15/2010   Fibromyalgia 06/20/2009   Vertigo 03/05/2008   Polymyositis ([place]) 02/11/2008   BRONCHIECTASIS 02/10/2008    PCP: Binnie Rail, MD REFERRING PROVIDER: Binnie Rail, MD  REFERRING DIAG: R42 (ICD-10-CM) - Vertigo  THERAPY DIAG:  Dizziness and giddiness  Unsteadiness on feet  ONSET DATE: 06/27/2022 referral  Rationale for Evaluation and Treatment: Rehabilitation  SUBJECTIVE:   SUBJECTIVE STATEMENT: Patient reports doing well. Did use her cane when walking outside and generally felt better/more steady. Agreeable to going on hold for 30 days to assess whether HEP helps her the improve the remainder of the way. Denies  falls/near falls.   Pt accompanied by: self  PERTINENT HISTORY: vertigo many years ago (?)   PAIN:  Are you having pain? No  There were no vitals filed for this visit.   PRECAUTIONS: Fall  PATIENT GOALS: "how to navigate with the dizziness"   VESTIBULAR TREATMENT:     -standing in corner:  -solid ground trunk twist on diagonal (up right, down left)   -> standing on foam, EO, EC -blaze pods with distracting colors 3x2 mins with 4 on mirror biased to L and 2 on ground  -treadmill 0.30mh reading playing cards biased to L   -reported slight delay in processing when cards were eye level, but this improved   -slight onset of L sided HA that  resolved when stopping the activity was stopped   PATIENT EDUCATION: Education details: continue HEP, slow return to driving, going on hold for 30 days Person educated: Patient Education method: Explanation, Demonstration, and Handouts Education comprehension: verbalized understanding and returned demonstration  HOME EXERCISE PROGRAM: Standing VOR x60 seconds on pillows/foam (with feet closer together) and adding in patterned background  180 degree turns  [employer] MutualCode: [email or web address] URL: [email or web address] Date: 07/17/2022 Prepared by: CJanann August Initiated HEP for balance for vestibular input   Exercises - Romberg Stance Eyes Closed on Foam Pad  - 1-2 x daily - 5 x weekly - 3 sets - 30 hold - Romberg Stance with Head Nods on Foam Pad  - 1 x daily - 5 x weekly - 2 sets - 10 reps and 10 reps head turns, pt more unsteady with head nods  On unlevel surface: EC 10 reps head turns, nods, diagonals each direction    GOALS: Goals reviewed with patient? Yes  SHORT TERM GOALS: = LTG based on POC length  LONG TERM GOALS: Target date: 08/10/22  Pt will be independent with final HEP for reduction in symptoms Baseline: to be provided Goal status: INITIAL  2.  Pt will report </= 1/5 for all movements on MSQ to indicate improvement in motion sensitivity and improved activity tolerance.  Baseline: 3/5, met on 08/08/22  Goal status: MET  3.  Pt will perform DVA in 1 line difference or less with no dizziness in order to demo improved VOR.  Baseline: 2 line difference and mild dizziness/unsteadiness  Goal status: INITIAL  4.  Patient will demonstrate (-) positional testing to indicate resolution of BPPV  Baseline: negative positional testing on 07/17/22 Goal status: MET  5.  FOTO goal Baseline: to be assessed Goal status: INITIAL   ASSESSMENT:  CLINICAL IMPRESSION: Patient seen for skilled PT session with emphasis on vestibular retraining. Patient  reporting overall progression with her symptoms. Still with some reports of unsteadiness with L diagonals and compliant surfaces. Agreeable to go on hold for 30 days to assess success of HEP and return if necessary. Continue POC.   OBJECTIVE IMPAIRMENTS: decreased balance, decreased knowledge of condition, and dizziness.   ACTIVITY LIMITATIONS: bending, squatting, stairs, bed mobility, bathing, hygiene/grooming, and caring for others  PARTICIPATION LIMITATIONS: meal prep, cleaning, laundry, interpersonal relationship, driving, shopping, and community activity  PERSONAL FACTORS: Age, Past/current experiences, Sex, Time since onset of injury/illness/exacerbation, Transportation, and 1-2 comorbidities: HTN, RA  are also affecting patient's functional outcome.   REHAB POTENTIAL: Good  CLINICAL DECISION MAKING: Evolving/moderate complexity  EVALUATION COMPLEXITY: Moderate   PLAN:  PT FREQUENCY: 2x/week  PT DURATION: 4 weeks  PLANNED INTERVENTIONS: Therapeutic exercises, Therapeutic activity, Neuromuscular re-education, Balance training, Gait training,  Patient/Family education, Self Care, Joint mobilization, Stair training, Vestibular training, Canalith repositioning, Visual/preceptual remediation/compensation, and Re-evaluation  PLAN FOR NEXT SESSION: return from on hold  Debbora Dus, PT, DPT, CBIS  08/10/22 9:03 AM

## 2022-08-14 ENCOUNTER — Encounter: Payer: Self-pay | Admitting: Internal Medicine

## 2022-08-14 NOTE — Patient Instructions (Addendum)
      Blood work was ordered.   The lab is on the first floor.    Medications changes include :       A referral was ordered for XXX.     Someone will call you to schedule an appointment.    Return for Physical Exam.

## 2022-08-14 NOTE — Progress Notes (Unsigned)
Subjective:    Patient ID: Jill Harper, female    DOB: 1965-11-23, 57 y.o.   MRN: [id number]     HPI Jill Harper is here for follow up of her chronic medical problems, including anxiety, htn, prediabetes, fibromyalgia, vit d def  Doing therapy for vertigo - going well.  Symptoms are better. Still doing PT.   Overall doing well.      Medications and allergies reviewed with patient and updated if appropriate.  Current Outpatient Medications on File Prior to Visit  Medication Sig Dispense Refill   albuterol (PROVENTIL HFA) 108 (90 Base) MCG/ACT inhaler Inhale 2 puffs into the lungs 4 (four) times daily as needed. Reported on 09/03/2015 1 each 4   albuterol (PROVENTIL) (2.5 MG/3ML) 0.083% nebulizer solution Take 3 mLs (2.5 mg total) by nebulization every 6 (six) hours as needed for wheezing or shortness of breath. 120 vial 5   aspirin EC 81 MG tablet Take 81 mg by mouth daily.     atenolol (TENORMIN) 25 MG tablet Take 1 tablet (25 mg total) by mouth daily. 90 tablet 2   azaTHIOprine (IMURAN) 50 MG tablet TAKE 2 TABLETS BY MOUTH DAILY 60 tablet 2   benzonatate (TESSALON) 200 MG capsule Take 1 capsule (200 mg total) by mouth 3 (three) times daily as needed. 30 capsule 1   Cholecalciferol (VITAMIN D) 50 MCG (2000 UT) CAPS Take by mouth.     diclofenac Sodium (VOLTAREN) 1 % GEL APPLY 2 TO 4 GRAMS TOPICALLY TO AFFECTED JOINT FOUR TIMES DAILY AS NEEDED 400 g 2   DULoxetine (CYMBALTA) 30 MG capsule TAKE ONE CAPSULE BY MOUTH DAILY FOR TOTAL OF 90 MG DAILY 30 capsule 3   DULoxetine (CYMBALTA) 60 MG capsule TAKE 1 CAPSULE(60 MG) BY MOUTH DAILY 90 capsule 1   Fluticasone-Umeclidin-Vilant (TRELEGY ELLIPTA) 100-62.5-25 MCG/ACT AEPB Inhale 1 puff into the lungs daily. 60 each 5   gabapentin (NEURONTIN) 100 MG capsule Take 3 capsules (300 mg total) by mouth at bedtime. 270 capsule 3   Galcanezumab-gnlm (EMGALITY) 120 MG/ML SOAJ ADMINISTER 1 ML UNDER THE SKIN EVERY 30 DAYS 3 mL 3   guaiFENesin (MUCINEX)  600 MG 12 hr tablet Take 2 tablets (1,200 mg total) by mouth 2 (two) times daily as needed for cough or to loosen phlegm. (Patient taking differently: Take 1,200 mg by mouth 2 (two) times daily.)     meclizine (ANTIVERT) 25 MG tablet Take 1 tablet (25 mg total) by mouth 3 (three) times daily as needed for dizziness. 30 tablet 0   metFORMIN (GLUCOPHAGE) 500 MG tablet TAKE 1 TABLET(500 MG) BY MOUTH TWICE DAILY WITH A MEAL 180 tablet 1   methocarbamol (ROBAXIN) 500 MG tablet Take by mouth as needed.     Multiple Vitamins-Minerals (MULTIVITAMIN GUMMIES ADULT PO) Take 2 each by mouth daily.     Rimegepant Sulfate (NURTEC) 75 MG TBDP DISSOLVE ONE TABLET BY MOUTH DAILY AS NEEDED FOR MIGRAINES. TAKE AS CLOSE TO ONSET OF MIGRAINE AS POSSIBLE. 1 TABLET DAILY MAXIMUM. 15 tablet 11   sodium chloride HYPERTONIC 3 % nebulizer solution Take by nebulization as needed for other. 750 mL 12   telmisartan (MICARDIS) 20 MG tablet TAKE 1 TABLET(20 MG) BY MOUTH DAILY 90 tablet 1   No current facility-administered medications on file prior to visit.     Review of Systems  Constitutional:  Negative for fever.  Respiratory:  Positive for cough, shortness of breath and wheezing.   Cardiovascular:  Positive for  chest pain (occ - likely related to MVP) and palpitations. Negative for leg swelling (left leg puffier than right - chronic).  Musculoskeletal:  Positive for arthralgias and myalgias.  Neurological:  Positive for dizziness and headaches.  Psychiatric/Behavioral:  The patient is nervous/anxious.        Brain fog       Objective:   Vitals:   08/15/22 1550  BP: 126/78  Pulse: 65  Temp: 98.7 F (37.1 C)  SpO2: 99%   BP Readings from Last 3 Encounters:  08/15/22 126/78  07/20/22 124/71  07/11/22 126/77   Wt Readings from Last 3 Encounters:  08/15/22 295 lb (133.8 kg)  07/11/22 294 lb 9.6 oz (133.6 kg)  06/27/22 293 lb (132.9 kg)   Body mass index is 44.85 kg/m.    Physical Exam Constitutional:       General: She is not in acute distress.    Appearance: Normal appearance.  HENT:     Head: Normocephalic and atraumatic.  Eyes:     Conjunctiva/sclera: Conjunctivae normal.  Cardiovascular:     Rate and Rhythm: Normal rate and regular rhythm.     Heart sounds: Normal heart sounds. No murmur heard. Pulmonary:     Effort: Pulmonary effort is normal. No respiratory distress.     Breath sounds: Normal breath sounds. No wheezing.  Musculoskeletal:     Cervical back: Neck supple.     Right lower leg: No edema.     Left lower leg: No edema.  Lymphadenopathy:     Cervical: No cervical adenopathy.  Skin:    General: Skin is warm and dry.     Findings: No rash.  Neurological:     Mental Status: She is alert. Mental status is at baseline.  Psychiatric:        Mood and Affect: Mood normal.        Behavior: Behavior normal.        Lab Results  Component Value Date   WBC 8.1 05/22/2022   HGB 12.7 05/22/2022   HCT 38.6 05/22/2022   PLT 269 05/22/2022   GLUCOSE 109 (H) 05/22/2022   CHOL 129 07/11/2021   TRIG 78.0 07/11/2021   HDL 39.60 07/11/2021   LDLCALC 74 07/11/2021   ALT 14 05/22/2022   AST 14 05/22/2022   NA 141 05/22/2022   K 4.4 05/22/2022   CL 108 05/22/2022   CREATININE 0.70 05/22/2022   BUN 10 05/22/2022   CO2 26 05/22/2022   TSH 6.06 (H) 07/11/2021   INR 1.03 09/18/2018   HGBA1C 5.9 07/11/2021     Assessment & Plan:    See Problem List for Assessment and Plan of chronic medical problems.

## 2022-08-15 ENCOUNTER — Encounter: Payer: Self-pay | Admitting: *Deleted

## 2022-08-15 ENCOUNTER — Other Ambulatory Visit: Payer: Self-pay | Admitting: Physician Assistant

## 2022-08-15 ENCOUNTER — Ambulatory Visit: Payer: Federal, State, Local not specified - PPO | Admitting: Internal Medicine

## 2022-08-15 VITALS — BP 126/78 | HR 65 | Temp 98.7°F | Ht 68.0 in | Wt 295.0 lb

## 2022-08-15 DIAGNOSIS — R42 Dizziness and giddiness: Secondary | ICD-10-CM

## 2022-08-15 DIAGNOSIS — R635 Abnormal weight gain: Secondary | ICD-10-CM | POA: Insufficient documentation

## 2022-08-15 DIAGNOSIS — M797 Fibromyalgia: Secondary | ICD-10-CM | POA: Diagnosis not present

## 2022-08-15 DIAGNOSIS — I341 Nonrheumatic mitral (valve) prolapse: Secondary | ICD-10-CM

## 2022-08-15 DIAGNOSIS — R1011 Right upper quadrant pain: Secondary | ICD-10-CM | POA: Insufficient documentation

## 2022-08-15 DIAGNOSIS — I340 Nonrheumatic mitral (valve) insufficiency: Secondary | ICD-10-CM

## 2022-08-15 DIAGNOSIS — F419 Anxiety disorder, unspecified: Secondary | ICD-10-CM

## 2022-08-15 DIAGNOSIS — F410 Panic disorder [episodic paroxysmal anxiety] without agoraphobia: Secondary | ICD-10-CM | POA: Diagnosis not present

## 2022-08-15 DIAGNOSIS — R194 Change in bowel habit: Secondary | ICD-10-CM | POA: Insufficient documentation

## 2022-08-15 DIAGNOSIS — R7303 Prediabetes: Secondary | ICD-10-CM | POA: Diagnosis not present

## 2022-08-15 DIAGNOSIS — M0579 Rheumatoid arthritis with rheumatoid factor of multiple sites without organ or systems involvement: Secondary | ICD-10-CM

## 2022-08-15 DIAGNOSIS — I10 Essential (primary) hypertension: Secondary | ICD-10-CM | POA: Diagnosis not present

## 2022-08-15 NOTE — Telephone Encounter (Signed)
Next Visit: 10/11/2022  Last Visit: 07/11/2022  Last Fill: 05/24/2022  DX: Rheumatoid arthritis involving multiple sites with positive rheumatoid factor   Current Dose per office note 07/11/2022: Enbrel 50 mg sq injections once weekly   Labs: 05/22/2022 CBC and CMP are normal.   TB Gold: 02/14/2022 Neg    Notified patient via my chart she is due to update labs.   Okay to refill Enbrel?

## 2022-08-15 NOTE — Assessment & Plan Note (Signed)
Chronic Has whole body soreness and associated brain fog Continue Cymbalta 90 mg daily, gabapentin 300 mg at bedtime

## 2022-08-15 NOTE — Assessment & Plan Note (Signed)
Chronic Blood pressure well controlled CMP Continue atenolol 25 mg daily, telmisartan 20 mg daily

## 2022-08-15 NOTE — Assessment & Plan Note (Signed)
Subacute Doing vestibular PT  Symptoms have improved - still doing PT

## 2022-08-15 NOTE — Assessment & Plan Note (Signed)
Chronic Occasional palpitations, occasional with chest pain on occasion On atenolol 25 mg daily Overall controlled

## 2022-08-15 NOTE — Assessment & Plan Note (Addendum)
Chronic Has intermittent anxiety - usually triggers that cause it Overall controlled, Stable Continue duloxetine 90 mg daily

## 2022-08-15 NOTE — Assessment & Plan Note (Signed)
Chronic Encourage regular exercise Low sugar/carbohydrate diet

## 2022-08-16 ENCOUNTER — Other Ambulatory Visit: Payer: Self-pay | Admitting: Rheumatology

## 2022-08-16 NOTE — Telephone Encounter (Signed)
Next Visit: 10/11/2022  Last Visit: 07/11/2022  Last Fill: 05/21/2022  DX: Rheumatoid arthritis involving multiple sites with positive rheumatoid factor    Current Dose per office note 07/11/2022: Imuran 100 mg by mouth daily   Labs: 05/22/2022 CBC and CMP are normal.   Okay to refill Imuran?

## 2022-08-19 DIAGNOSIS — G4733 Obstructive sleep apnea (adult) (pediatric): Secondary | ICD-10-CM | POA: Diagnosis not present

## 2022-08-19 DIAGNOSIS — J471 Bronchiectasis with (acute) exacerbation: Secondary | ICD-10-CM | POA: Diagnosis not present

## 2022-08-20 ENCOUNTER — Other Ambulatory Visit: Payer: Self-pay | Admitting: Nurse Practitioner

## 2022-08-21 ENCOUNTER — Other Ambulatory Visit: Payer: Self-pay | Admitting: *Deleted

## 2022-08-21 DIAGNOSIS — Z79899 Other long term (current) drug therapy: Secondary | ICD-10-CM

## 2022-08-21 DIAGNOSIS — M3320 Polymyositis, organ involvement unspecified: Secondary | ICD-10-CM | POA: Diagnosis not present

## 2022-08-22 DIAGNOSIS — F410 Panic disorder [episodic paroxysmal anxiety] without agoraphobia: Secondary | ICD-10-CM | POA: Diagnosis not present

## 2022-08-22 LAB — CBC WITH DIFFERENTIAL/PLATELET
Absolute Monocytes: 587 cells/uL (ref 200–950)
Basophils Absolute: 43 cells/uL (ref 0–200)
Basophils Relative: 0.5 %
Eosinophils Absolute: 187 cells/uL (ref 15–500)
Eosinophils Relative: 2.2 %
HCT: 39.2 % (ref 35.0–45.0)
Hemoglobin: 13 g/dL (ref 11.7–15.5)
Lymphs Abs: 1921 cells/uL (ref 850–3900)
MCH: 28.2 pg (ref 27.0–33.0)
MCHC: 33.2 g/dL (ref 32.0–36.0)
MCV: 85 fL (ref 80.0–100.0)
MPV: 11.1 fL (ref 7.5–12.5)
Monocytes Relative: 6.9 %
Neutro Abs: 5763 cells/uL (ref 1500–7800)
Neutrophils Relative %: 67.8 %
Platelets: 287 10*3/uL (ref 140–400)
RBC: 4.61 10*6/uL (ref 3.80–5.10)
RDW: 12.3 % (ref 11.0–15.0)
Total Lymphocyte: 22.6 %
WBC: 8.5 10*3/uL (ref 3.8–10.8)

## 2022-08-22 LAB — COMPLETE METABOLIC PANEL WITH GFR
AG Ratio: 1.2 (calc) (ref 1.0–2.5)
ALT: 10 U/L (ref 6–29)
AST: 13 U/L (ref 10–35)
Albumin: 4.2 g/dL (ref 3.6–5.1)
Alkaline phosphatase (APISO): 70 U/L (ref 37–153)
BUN: 10 mg/dL (ref 7–25)
CO2: 29 mmol/L (ref 20–32)
Calcium: 9.4 mg/dL (ref 8.6–10.4)
Chloride: 106 mmol/L (ref 98–110)
Creat: 0.83 mg/dL (ref 0.50–1.03)
Globulin: 3.5 g/dL (calc) (ref 1.9–3.7)
Glucose, Bld: 109 mg/dL — ABNORMAL HIGH (ref 65–99)
Potassium: 4.4 mmol/L (ref 3.5–5.3)
Sodium: 141 mmol/L (ref 135–146)
Total Bilirubin: 0.5 mg/dL (ref 0.2–1.2)
Total Protein: 7.7 g/dL (ref 6.1–8.1)
eGFR: 83 mL/min/{1.73_m2} (ref >=60)

## 2022-08-22 LAB — CK: Total CK: 48 U/L (ref 29–143)

## 2022-08-22 NOTE — Progress Notes (Signed)
CK WNL

## 2022-08-22 NOTE — Progress Notes (Signed)
CBC and CMP normal

## 2022-08-29 DIAGNOSIS — F410 Panic disorder [episodic paroxysmal anxiety] without agoraphobia: Secondary | ICD-10-CM | POA: Diagnosis not present

## 2022-09-04 ENCOUNTER — Other Ambulatory Visit: Payer: Self-pay | Admitting: Physician Assistant

## 2022-09-04 DIAGNOSIS — M0579 Rheumatoid arthritis with rheumatoid factor of multiple sites without organ or systems involvement: Secondary | ICD-10-CM

## 2022-09-04 NOTE — Telephone Encounter (Signed)
Next Visit: 10/11/2022  Last Visit: 07/11/2022  Last Fill: 08/15/2022  DJ:[id number] arthritis involving multiple sites with positive rheumatoid factor   Current Dose per office note on 07/11/2022: Enbrel 50 mg sq injections once weekly   Labs: 08/21/2022 CBC and CMP normal.   TB Gold: 02/14/2022 negative    Okay to refill enbrel?

## 2022-09-05 ENCOUNTER — Encounter: Payer: Self-pay | Admitting: Internal Medicine

## 2022-09-05 ENCOUNTER — Ambulatory Visit: Payer: Federal, State, Local not specified - PPO

## 2022-09-05 DIAGNOSIS — R2681 Unsteadiness on feet: Secondary | ICD-10-CM

## 2022-09-05 DIAGNOSIS — R42 Dizziness and giddiness: Secondary | ICD-10-CM | POA: Diagnosis not present

## 2022-09-05 DIAGNOSIS — F410 Panic disorder [episodic paroxysmal anxiety] without agoraphobia: Secondary | ICD-10-CM | POA: Diagnosis not present

## 2022-09-05 NOTE — Therapy (Signed)
OUTPATIENT PHYSICAL THERAPY VESTIBULAR TREATMENT/ DISCHARGE SUMMARY      Patient Name: Jill Harper MRN: [id number] DOB:02/22/1966, 57 y.o., female Today's Date: 09/05/2022  PHYSICAL THERAPY DISCHARGE SUMMARY  Visits from Start of Care: 9  Current functional level related to goals / functional outcomes: See below   Remaining deficits: "Flickers" of dizziness   Education / Equipment: PT POC, HEP   Patient agrees to discharge. Patient goals were met. Patient is being discharged due to meeting the stated rehab goals.  END OF SESSION:  PT End of Session - 09/05/22 0752     Visit Number 9    Number of Visits 9    Date for PT Re-Evaluation 09/07/22    Authorization Type BC[employer]    PT Start Time 0800    PT Stop Time 0818   discharge   PT Time Calculation (min) 18 min    Activity Tolerance Patient tolerated treatment well    Behavior During Therapy WFL for tasks assessed/performed              Past Medical History:  Diagnosis Date   Abnormal liver function test    Bronchiectasis        Dyspnea    Fibromuscular dysplasia (HCC)    Fibromyalgia    Heart murmur    ILD (interstitial lung disease) ([place])    Joint pain    Menorrhagia    MIGRAINE HEADACHE    MVP (mitral valve prolapse)    Osteoarthritis    Polymyositis ([place])    Dr [name]; chronic MTX   Pulmonary fibrosis ([place])    Rheumatoid arthritis ([place])    Sleep apnea    Vertigo    Vitamin B 12 deficiency    Vitamin D deficiency    Past Surgical History:  Procedure Laterality Date   ablation uterine  2010   CARDIAC CATHETERIZATION  2002   normal   DILATION AND CURETTAGE OF UTERUS  08-31-08   Dr [name]   IR ANGIO INTRA EXTRACRAN SEL COM CAROTID INNOMINATE BILAT MOD SED  09/18/2018   IR ANGIO VERTEBRAL SEL VERTEBRAL BILAT MOD SED  09/18/2018   IR US GUIDE VASC ACCESS RIGHT  09/18/2018   Patient Active Problem List   Diagnosis Date Noted   Abnormal weight gain 08/15/2022   Change in bowel  habit 08/15/2022   Right upper quadrant pain 08/15/2022   Palpitations-related to MVP 04/02/2022   Nocturnal hypoxia 01/16/2022   Fibromuscular dysplasia of wall of intracranial artery ([place]) 12/20/2021   Anxiety 09/06/2021   Tingling 06/28/2021   Occipital neuralgia of left side 70/26/3785   Metabolic syndrome X 88/50/2774   Entrapment of left ulnar nerve 07/27/2019   Right hand pain 07/27/2019   Bilateral carpal tunnel syndrome 07/21/2019   Costochondritis 03/16/2019   Rib pain on right side 03/16/2019   Prediabetes 11/27/2018   Fibromuscular dysplasia ([place]) 10/17/2018   Hypertension 10/17/2018   Bronchiectasis with (acute) exacerbation ([place]) 10/08/2018   OSA (obstructive sleep apnea) 08/20/2018   Rheumatoid arthritis ([place]) 04/17/2018   ILD (interstitial lung disease) ([place]) 04/17/2018   Dyspnea 04/01/2018   Bronchiectasis ([place]) 09/02/2017   LVH (left ventricular hypertrophy), mild 06/02/2017   Mitral regurgitation 05/22/2017   Jo-1 antibody positive 01/29/2017   Primary osteoarthritis of both feet 01/29/2017   Primary osteoarthritis of both hands 01/29/2017   Left shoulder tendonitis 01/17/2017   Vitamin D deficiency 04/26/2016   Cough 07/18/2015   MVP (mitral valve prolapse)    Erythema nodosum  INCONTINENCE, URGE 05/26/2010   Migraine headache 02/15/2010   Fibromyalgia 06/20/2009   Vertigo 03/05/2008   Polymyositis ([place]) 02/11/2008   BRONCHIECTASIS 02/10/2008    PCP: Binnie Rail, MD REFERRING PROVIDER: Binnie Rail, MD  REFERRING DIAG: R42 (ICD-10-CM) - Vertigo  THERAPY DIAG:  Dizziness and giddiness  Unsteadiness on feet  ONSET DATE: 06/27/2022 referral  Rationale for Evaluation and Treatment: Rehabilitation  SUBJECTIVE:   SUBJECTIVE STATEMENT: Patient reports doing well. Reports she feels 98% better with still some "flickers" of dizziness. Cane in the car, but not really using it. Denies falls/near falls.   Pt accompanied by: self  PERTINENT  HISTORY: vertigo many years ago (?)   PAIN:  Are you having pain? No  There were no vitals filed for this visit.   PRECAUTIONS: Fall  PATIENT GOALS: "how to navigate with the dizziness"   VESTIBULAR TREATMENT:    Motion Sensitivity Quotient  Intensity: 0 = none, 1 = Lightheaded, 2 = Mild, 3 = Moderate, 4 = Severe, 5 = Vomiting  Intensity  1. Sitting to supine 0  2. Supine to L side 0  3. Supine to R side 0  4. Supine to sitting 0  5. L Hallpike-Dix 0  6. Up from L  0  7. R Hallpike-Dix 0  8. Up from R  0  9. Sitting, head  tipped to L knee 0  10. Head up from L  knee 0  11. Sitting, head  tipped to R knee 0  12. Head up from R  knee 0  13. Sitting head turns x5 0  14.Sitting head nods x5 0  15. In stance, 180  turn to L  0  16. In stance, 180  turn to R 0     PATIENT EDUCATION: Education details: continue HEP, dc from PT Person educated: Patient Education method: Explanation, Demonstration, and Handouts Education comprehension: verbalized understanding and returned demonstration  HOME EXERCISE PROGRAM: Standing VOR x60 seconds on pillows/foam (with feet closer together) and adding in patterned background  180 degree turns  [employer] Code: [email or web address] URL: [email or web address] Date: 07/17/2022 Prepared by: Janann August Exercises - Romberg Stance Eyes Closed on Foam Pad  - 1-2 x daily - 5 x weekly - 3 sets - 30 hold - Romberg Stance with Head Nods on Foam Pad  - 1 x daily - 5 x weekly - 2 sets - 10 reps and 10 reps head turns, pt more unsteady with head nods  On unlevel surface: EC 10 reps head turns, nods, diagonals each direction    GOALS: Goals reviewed with patient? Yes  SHORT TERM GOALS: = LTG based on POC length  LONG TERM GOALS: Target date: 08/10/22  Pt will be independent with final HEP for reduction in symptoms Baseline: to be provided; provided Goal status: MET  2.  Pt will report </= 1/5 for all movements  on MSQ to indicate improvement in motion sensitivity and improved activity tolerance.  Baseline: 3/5, met on 08/08/22  Goal status: MET  3.  Pt will perform DVA in 1 line difference or less with no dizziness in order to demo improved VOR.  Baseline: 2 line difference and mild dizziness/unsteadiness; unable to perform as patient doesn't have her contacts in today  Goal status: Discontinued  4.  Patient will demonstrate (-) positional testing to indicate resolution of BPPV  Baseline: negative positional testing on 07/17/22 Goal status: MET  5.  FOTO goal Baseline: discontinued; did not capture  Goal status: deferred   ASSESSMENT:  CLINICAL IMPRESSION: Patient seen for skilled PT session with emphasis on discharge from PT and goal assessment. She met 3/3 LTG. She made excellent progress with therapy and only reports intermittent dizziness. Discussed weaning off of HEP exercises and re-starting should she notice return of vertigo symptoms. Patient agreeable to this. Agreeable to dc from PT.   OBJECTIVE IMPAIRMENTS: decreased balance, decreased knowledge of condition, and dizziness.   ACTIVITY LIMITATIONS: bending, squatting, stairs, bed mobility, bathing, hygiene/grooming, and caring for others  PARTICIPATION LIMITATIONS: meal prep, cleaning, laundry, interpersonal relationship, driving, shopping, and community activity  PERSONAL FACTORS: Age, Past/current experiences, Sex, Time since onset of injury/illness/exacerbation, Transportation, and 1-2 comorbidities: HTN, RA  are also affecting patient's functional outcome.   REHAB POTENTIAL: Good  CLINICAL DECISION MAKING: Evolving/moderate complexity  EVALUATION COMPLEXITY: Moderate   PLAN:  PT FREQUENCY: 2x/week  PT DURATION: 4 weeks  PLANNED INTERVENTIONS: Therapeutic exercises, Therapeutic activity, Neuromuscular re-education, Balance training, Gait training, Patient/Family education, Self Care, Joint mobilization, Stair training,  Vestibular training, Canalith repositioning, Visual/preceptual remediation/compensation, and Re-evaluation  PLAN FOR NEXT SESSION: dc from PT  Debbora Dus, PT, DPT, CBIS  09/05/22 8:34 AM

## 2022-09-11 ENCOUNTER — Other Ambulatory Visit: Payer: Self-pay | Admitting: Internal Medicine

## 2022-09-11 ENCOUNTER — Encounter: Payer: Self-pay | Admitting: Internal Medicine

## 2022-09-11 DIAGNOSIS — R7303 Prediabetes: Secondary | ICD-10-CM

## 2022-09-12 ENCOUNTER — Encounter: Payer: Self-pay | Admitting: Internal Medicine

## 2022-09-12 DIAGNOSIS — F410 Panic disorder [episodic paroxysmal anxiety] without agoraphobia: Secondary | ICD-10-CM | POA: Diagnosis not present

## 2022-09-12 NOTE — Progress Notes (Unsigned)
Subjective:    Patient ID: Jill Harper, female    DOB: July 11, 1966, 57 y.o.   MRN: [id number]      HPI Jill Harper is here for No chief complaint on file.    Left arm swelling  -   noticed it last Wednesday.  It has gotten better.  She noticed it because it was hard to pick up a paper.  She could not extend her left wrist because it was swollen. It was a little painful and tight. She iced it.  The area waas a little warm.  She took an aleve later that night because it was painful.  Took aleve for a couple of days.  After a few days - still swollen, not painful.  The swelling is slowly decreasing.   She denies overuse.  She is right handed .     Had raynouds symptom  Left shoulder is a little tender to the touch.     Medications and allergies reviewed with patient and updated if appropriate.  Current Outpatient Medications on File Prior to Visit  Medication Sig Dispense Refill   albuterol (PROVENTIL HFA) 108 (90 Base) MCG/ACT inhaler Inhale 2 puffs into the lungs 4 (four) times daily as needed. Reported on 09/03/2015 1 each 4   albuterol (PROVENTIL) (2.5 MG/3ML) 0.083% nebulizer solution Take 3 mLs (2.5 mg total) by nebulization every 6 (six) hours as needed for wheezing or shortness of breath. 120 vial 5   aspirin EC 81 MG tablet Take 81 mg by mouth daily.     atenolol (TENORMIN) 25 MG tablet Take 1 tablet (25 mg total) by mouth daily. 90 tablet 2   azaTHIOprine (IMURAN) 50 MG tablet TAKE 2 TABLETS BY MOUTH DAILY 60 tablet 2   benzonatate (TESSALON) 200 MG capsule Take 1 capsule (200 mg total) by mouth 3 (three) times daily as needed. 30 capsule 1   Cholecalciferol (VITAMIN D) 50 MCG (2000 UT) CAPS Take by mouth.     diclofenac Sodium (VOLTAREN) 1 % GEL APPLY 2 TO 4 GRAMS TOPICALLY TO AFFECTED JOINT FOUR TIMES DAILY AS NEEDED 400 g 2   DULoxetine (CYMBALTA) 30 MG capsule TAKE ONE CAPSULE BY MOUTH DAILY FOR TOTAL OF 90 MG DAILY 30 capsule 3   DULoxetine (CYMBALTA) 60 MG capsule TAKE 1  CAPSULE(60 MG) BY MOUTH DAILY 90 capsule 1   ENBREL MINI 50 MG/ML SOCT INSERT 1 MINI CARTRIDGE INTO AUTOINJECTOR AND INJECT 50 MG UNDER THE SKIN EVERY 7 DAYS 12 mL 0   gabapentin (NEURONTIN) 100 MG capsule Take 3 capsules (300 mg total) by mouth at bedtime. 270 capsule 3   Galcanezumab-gnlm (EMGALITY) 120 MG/ML SOAJ ADMINISTER 1 ML UNDER THE SKIN EVERY 30 DAYS 3 mL 3   guaiFENesin (MUCINEX) 600 MG 12 hr tablet Take 2 tablets (1,200 mg total) by mouth 2 (two) times daily as needed for cough or to loosen phlegm. (Patient taking differently: Take 1,200 mg by mouth 2 (two) times daily.)     meclizine (ANTIVERT) 25 MG tablet Take 1 tablet (25 mg total) by mouth 3 (three) times daily as needed for dizziness. 30 tablet 0   metFORMIN (GLUCOPHAGE) 500 MG tablet TAKE 1 TABLET(500 MG) BY MOUTH TWICE DAILY WITH A MEAL 180 tablet 1   methocarbamol (ROBAXIN) 500 MG tablet Take by mouth as needed.     Multiple Vitamins-Minerals (MULTIVITAMIN GUMMIES ADULT PO) Take 2 each by mouth daily.     Rimegepant Sulfate (NURTEC) 75 MG TBDP DISSOLVE ONE  TABLET BY MOUTH DAILY AS NEEDED FOR MIGRAINES. TAKE AS CLOSE TO ONSET OF MIGRAINE AS POSSIBLE. 1 TABLET DAILY MAXIMUM. 15 tablet 11   sodium chloride HYPERTONIC 3 % nebulizer solution Take by nebulization as needed for other. 750 mL 12   telmisartan (MICARDIS) 20 MG tablet TAKE 1 TABLET(20 MG) BY MOUTH DAILY 90 tablet 1   TRELEGY ELLIPTA 100-62.5-25 MCG/ACT AEPB INHALE 1 PUFF INTO THE LUNGS DAILY 60 each 5   No current facility-administered medications on file prior to visit.    Review of Systems  Constitutional:  Negative for chills and fever.  Skin:  Negative for color change, rash and wound.  Neurological:  Positive for weakness (in left hand). Negative for numbness.       Objective:   Vitals:   09/13/22 0744  BP: 126/80  Pulse: 68  Temp: 98.6 F (37 C)  SpO2: 98%   BP Readings from Last 3 Encounters:  09/13/22 126/80  08/15/22 126/78  07/20/22 124/71    Wt Readings from Last 3 Encounters:  09/13/22 296 lb (134.3 kg)  08/15/22 295 lb (133.8 kg)  07/11/22 294 lb 9.6 oz (133.6 kg)   Body mass index is 45.01 kg/m.    Physical Exam Constitutional:      General: She is not in acute distress.    Appearance: Normal appearance. She is not ill-appearing.  HENT:     Head: Normocephalic and atraumatic.  Musculoskeletal:        General: Swelling (mild swelling posterior distal left lower arm) and tenderness (tenderness to palpation posterior left distal lower arm and through arm with palpation) present. Normal range of motion.  Skin:    General: Skin is warm and dry.     Findings: No erythema, lesion or rash.  Neurological:     Mental Status: She is alert.     Sensory: No sensory deficit.     Motor: No weakness.            Assessment & Plan:    See Problem List for Assessment and Plan of chronic medical problems.

## 2022-09-13 ENCOUNTER — Encounter: Payer: Self-pay | Admitting: Internal Medicine

## 2022-09-13 ENCOUNTER — Ambulatory Visit (HOSPITAL_COMMUNITY)
Admission: RE | Admit: 2022-09-13 | Discharge: 2022-09-13 | Disposition: A | Discharge status: Home / Self Care | Payer: Federal, State, Local not specified - PPO | Source: Ambulatory Visit | Attending: Internal Medicine | Admitting: Internal Medicine

## 2022-09-13 ENCOUNTER — Ambulatory Visit: Payer: Federal, State, Local not specified - PPO | Admitting: Internal Medicine

## 2022-09-13 VITALS — BP 126/80 | HR 68 | Temp 98.6°F | Ht 68.0 in | Wt 296.0 lb

## 2022-09-13 DIAGNOSIS — I773 Arterial fibromuscular dysplasia: Secondary | ICD-10-CM

## 2022-09-13 DIAGNOSIS — M7989 Other specified soft tissue disorders: Secondary | ICD-10-CM | POA: Diagnosis not present

## 2022-09-13 DIAGNOSIS — M79602 Pain in left arm: Secondary | ICD-10-CM

## 2022-09-13 DIAGNOSIS — I10 Essential (primary) hypertension: Secondary | ICD-10-CM

## 2022-09-13 MED ORDER — TELMISARTAN 20 MG PO TABS
ORAL_TABLET | ORAL | 1 refills | Status: DC
Start: 2022-09-13 — End: 2023-04-22

## 2022-09-13 NOTE — Patient Instructions (Addendum)
      Ultrasound of left arm    Medications changes include :   none.  Continue aleve as needed for now.  Rest, ice/heat.        Return if symptoms worsen or fail to improve.

## 2022-09-13 NOTE — Assessment & Plan Note (Signed)
Acute Started mid last week for no obvious reason Tenderness, swelling in distal left lower arm, some hand weakness, raynouds phenomenon when this occurred Has FMD ? Vascular or msk Will get [name] of LUE - ideally venous and arterial given possible clot vs FMD  Can take aleve prn for short time, ice, heat, rest If no improvement and [name] negative will refer to sports med

## 2022-09-13 NOTE — Assessment & Plan Note (Signed)
Chronic BP well controlled Continue telmisartan 20 mg daily, atenolol 25 mg daily

## 2022-09-13 NOTE — Assessment & Plan Note (Signed)
As above.

## 2022-09-17 ENCOUNTER — Encounter (HOSPITAL_BASED_OUTPATIENT_CLINIC_OR_DEPARTMENT_OTHER): Payer: Self-pay | Admitting: Pulmonary Disease

## 2022-09-17 ENCOUNTER — Telehealth: Payer: Self-pay | Admitting: Pulmonary Disease

## 2022-09-17 ENCOUNTER — Encounter: Payer: Self-pay | Admitting: Internal Medicine

## 2022-09-17 DIAGNOSIS — G4734 Idiopathic sleep related nonobstructive alveolar hypoventilation: Secondary | ICD-10-CM

## 2022-09-17 DIAGNOSIS — J849 Interstitial pulmonary disease, unspecified: Secondary | ICD-10-CM

## 2022-09-17 DIAGNOSIS — G4733 Obstructive sleep apnea (adult) (pediatric): Secondary | ICD-10-CM

## 2022-09-17 NOTE — Patient Instructions (Addendum)
Blood work was ordered.   The lab is on the first floor.    Medications changes include :   none     Return in about 2 months (around 11/17/2022) for follow up.    Health Maintenance, Female Adopting a healthy lifestyle and getting preventive care are important in promoting health and wellness. Ask your health care provider about: The right schedule for you to have regular tests and exams. Things you can do on your own to prevent diseases and keep yourself healthy. What should I know about diet, weight, and exercise? Eat a healthy diet  Eat a diet that includes plenty of vegetables, fruits, low-fat dairy products, and lean protein. Do not eat a lot of foods that are high in solid fats, added sugars, or sodium. Maintain a healthy weight Body mass index (BMI) is used to identify weight problems. It estimates body fat based on height and weight. Your health care provider can help determine your BMI and help you achieve or maintain a healthy weight. Get regular exercise Get regular exercise. This is one of the most important things you can do for your health. Most adults should: Exercise for at least 150 minutes each week. The exercise should increase your heart rate and make you sweat (moderate-intensity exercise). Do strengthening exercises at least twice a week. This is in addition to the moderate-intensity exercise. Spend less time sitting. Even light physical activity can be beneficial. Watch cholesterol and blood lipids Have your blood tested for lipids and cholesterol at 57 years of age, then have this test every 5 years. Have your cholesterol levels checked more often if: Your lipid or cholesterol levels are high. You are older than 57 years of age. You are at high risk for heart disease. What should I know about cancer screening? Depending on your health history and family history, you may need to have cancer screening at various ages. This may include screening  for: Breast cancer. Cervical cancer. Colorectal cancer. Skin cancer. Lung cancer. What should I know about heart disease, diabetes, and high blood pressure? Blood pressure and heart disease High blood pressure causes heart disease and increases the risk of stroke. This is more likely to develop in people who have high blood pressure readings or are overweight. Have your blood pressure checked: Every 3-5 years if you are 71-64 years of age. Every year if you are 16 years old or older. Diabetes Have regular diabetes screenings. This checks your fasting blood sugar level. Have the screening done: Once every three years after age 30 if you are at a normal weight and have a low risk for diabetes. More often and at a younger age if you are overweight or have a high risk for diabetes. What should I know about preventing infection? Hepatitis B If you have a higher risk for hepatitis B, you should be screened for this virus. Talk with your health care provider to find out if you are at risk for hepatitis B infection. Hepatitis C Testing is recommended for: Everyone born from [birth year] through 1965. Anyone with known risk factors for hepatitis C. Sexually transmitted infections (STIs) Get screened for STIs, including gonorrhea and chlamydia, if: You are sexually active and are younger than 57 years of age. You are older than 57 years of age and your health care provider tells you that you are at risk for this type of infection. Your sexual activity has changed since you were last screened, and you are  at increased risk for chlamydia or gonorrhea. Ask your health care provider if you are at risk. Ask your health care provider about whether you are at high risk for HIV. Your health care provider may recommend a prescription medicine to help prevent HIV infection. If you choose to take medicine to prevent HIV, you should first get tested for HIV. You should then be tested every 3 months for as long as you  are taking the medicine. Pregnancy If you are about to stop having your period (premenopausal) and you may become pregnant, seek counseling before you get pregnant. Take 400 to 800 micrograms (mcg) of folic acid every day if you become pregnant. Ask for birth control (contraception) if you want to prevent pregnancy. Osteoporosis and menopause Osteoporosis is a disease in which the bones lose minerals and strength with aging. This can result in bone fractures. If you are 78 years old or older, or if you are at risk for osteoporosis and fractures, ask your health care provider if you should: Be screened for bone loss. Take a calcium or vitamin D supplement to lower your risk of fractures. Be given hormone replacement therapy (HRT) to treat symptoms of menopause. Follow these instructions at home: Alcohol use Do not drink alcohol if: Your health care provider tells you not to drink. You are pregnant, may be pregnant, or are planning to become pregnant. If you drink alcohol: Limit how much you have to: 0-1 drink a day. Know how much alcohol is in your drink. In the [place] one drink equals one 12 oz bottle of beer (355 mL), one 5 oz glass of wine (148 mL), or one 1 oz glass of hard liquor (44 mL). Lifestyle Do not use any products that contain nicotine or tobacco. These products include cigarettes, chewing tobacco, and vaping devices, such as e-cigarettes. If you need help quitting, ask your health care provider. Do not use street drugs. Do not share needles. Ask your health care provider for help if you need support or information about quitting drugs. General instructions Schedule regular health, dental, and eye exams. Stay current with your vaccines. Tell your health care provider if: You often feel depressed. You have ever been abused or do not feel safe at home. Summary Adopting a healthy lifestyle and getting preventive care are important in promoting health and wellness. Follow your  health care provider's instructions about healthy diet, exercising, and getting tested or screened for diseases. Follow your health care provider's instructions on monitoring your cholesterol and blood pressure. This information is not intended to replace advice given to you by your health care provider. Make sure you discuss any questions you have with your health care provider. Document Revised: 12/12/2020 Document Reviewed: 12/12/2020 Elsevier Patient Education  [place].

## 2022-09-17 NOTE — Progress Notes (Unsigned)
Subjective:    Patient ID: Jill Harper, female    DOB: 1966-03-09, 57 y.o.   MRN: [id number]      HPI Jill Harper is here for a Physical exam.   Left posterior hand swelling but no pain.  The arm is not swollen.  Right hand is a minimally swollen too - she noticed it today - ? Autoimmune.    Worried about her weight. Her weight is down a little.     Medications and allergies reviewed with patient and updated if appropriate.  Current Outpatient Medications on File Prior to Visit  Medication Sig Dispense Refill   albuterol (PROVENTIL HFA) 108 (90 Base) MCG/ACT inhaler Inhale 2 puffs into the lungs 4 (four) times daily as needed. Reported on 09/03/2015 1 each 4   albuterol (PROVENTIL) (2.5 MG/3ML) 0.083% nebulizer solution Take 3 mLs (2.5 mg total) by nebulization every 6 (six) hours as needed for wheezing or shortness of breath. 120 vial 5   aspirin EC 81 MG tablet Take 81 mg by mouth daily.     atenolol (TENORMIN) 25 MG tablet Take 1 tablet (25 mg total) by mouth daily. 90 tablet 2   azaTHIOprine (IMURAN) 50 MG tablet TAKE 2 TABLETS BY MOUTH DAILY 60 tablet 2   benzonatate (TESSALON) 200 MG capsule Take 1 capsule (200 mg total) by mouth 3 (three) times daily as needed. 30 capsule 1   Cholecalciferol (VITAMIN D) 50 MCG (2000 UT) CAPS Take by mouth.     diclofenac Sodium (VOLTAREN) 1 % GEL APPLY 2 TO 4 GRAMS TOPICALLY TO AFFECTED JOINT FOUR TIMES DAILY AS NEEDED 400 g 2   DULoxetine (CYMBALTA) 30 MG capsule TAKE ONE CAPSULE BY MOUTH DAILY FOR TOTAL OF 90 MG DAILY 30 capsule 3   DULoxetine (CYMBALTA) 60 MG capsule TAKE 1 CAPSULE(60 MG) BY MOUTH DAILY 90 capsule 1   ENBREL MINI 50 MG/ML SOCT INSERT 1 MINI CARTRIDGE INTO AUTOINJECTOR AND INJECT 50 MG UNDER THE SKIN EVERY 7 DAYS 12 mL 0   gabapentin (NEURONTIN) 100 MG capsule Take 3 capsules (300 mg total) by mouth at bedtime. 270 capsule 3   Galcanezumab-gnlm (EMGALITY) 120 MG/ML SOAJ ADMINISTER 1 ML UNDER THE SKIN EVERY 30 DAYS 3 mL 3    guaiFENesin (MUCINEX) 600 MG 12 hr tablet Take 2 tablets (1,200 mg total) by mouth 2 (two) times daily as needed for cough or to loosen phlegm. (Patient taking differently: Take 1,200 mg by mouth 2 (two) times daily.)     meclizine (ANTIVERT) 25 MG tablet Take 1 tablet (25 mg total) by mouth 3 (three) times daily as needed for dizziness. 30 tablet 0   metFORMIN (GLUCOPHAGE) 500 MG tablet TAKE 1 TABLET(500 MG) BY MOUTH TWICE DAILY WITH A MEAL 180 tablet 1   methocarbamol (ROBAXIN) 500 MG tablet Take by mouth as needed.     Multiple Vitamins-Minerals (MULTIVITAMIN GUMMIES ADULT PO) Take 2 each by mouth daily.     Rimegepant Sulfate (NURTEC) 75 MG TBDP DISSOLVE ONE TABLET BY MOUTH DAILY AS NEEDED FOR MIGRAINES. TAKE AS CLOSE TO ONSET OF MIGRAINE AS POSSIBLE. 1 TABLET DAILY MAXIMUM. 15 tablet 11   sodium chloride HYPERTONIC 3 % nebulizer solution Take by nebulization as needed for other. 750 mL 12   telmisartan (MICARDIS) 20 MG tablet TAKE 1 TABLET(20 MG) BY MOUTH DAILY 90 tablet 1   TRELEGY ELLIPTA 100-62.5-25 MCG/ACT AEPB INHALE 1 PUFF INTO THE LUNGS DAILY 60 each 5   No current facility-administered medications  on file prior to visit.    Review of Systems  Constitutional:  Negative for fever.  Eyes:  Negative for visual disturbance.  Respiratory:  Positive for cough, shortness of breath and wheezing.   Cardiovascular:  Positive for chest pain (occ with MVP- pinching feeling) and palpitations. Negative for leg swelling.  Gastrointestinal:  Negative for abdominal pain, blood in stool, constipation and diarrhea.       No gerd  Genitourinary:  Negative for dysuria.  Musculoskeletal:  Positive for arthralgias. Negative for back pain.  Skin:  Negative for rash.  Neurological:  Positive for dizziness (once since PT). Negative for light-headedness and headaches.  Psychiatric/Behavioral:  Negative for dysphoric mood. The patient is nervous/anxious (fairly controlled).        Objective:   Vitals:    09/18/22 1519  BP: 122/68  Pulse: 65  Temp: 98.7 F (37.1 C)  SpO2: 99%   Filed Weights   09/18/22 1519  Weight: 293 lb (132.9 kg)   Body mass index is 44.55 kg/m.  BP Readings from Last 3 Encounters:  09/18/22 122/68  09/13/22 126/80  08/15/22 126/78    Wt Readings from Last 3 Encounters:  09/18/22 293 lb (132.9 kg)  09/13/22 296 lb (134.3 kg)  08/15/22 295 lb (133.8 kg)       Physical Exam Constitutional: She appears well-developed and well-nourished. No distress.  HENT:  Head: Normocephalic and atraumatic.  Right Ear: External ear normal. Normal ear canal and TM Left Ear: External ear normal.  Normal ear canal and TM Mouth/Throat: Oropharynx is clear and moist.  Eyes: Conjunctivae normal.  Neck: Neck supple. No tracheal deviation present. No thyromegaly present.  No carotid bruit  Cardiovascular: Normal rate, regular rhythm and normal heart sounds.   No murmur heard.  No edema. Pulmonary/Chest: Effort normal and breath sounds normal. No respiratory distress. She has no wheezes. She has no rales.  Breast: deferred   Abdominal: Soft. She exhibits no distension. There is no tenderness.  Lymphadenopathy: She has no cervical adenopathy.  Skin: Skin is warm and dry. She is not diaphoretic.  Psychiatric: She has a normal mood and affect. Her behavior is normal.     Lab Results  Component Value Date   WBC 8.5 08/21/2022   HGB 13.0 08/21/2022   HCT 39.2 08/21/2022   PLT 287 08/21/2022   GLUCOSE 109 (H) 08/21/2022   CHOL 129 07/11/2021   TRIG 78.0 07/11/2021   HDL 39.60 07/11/2021   LDLCALC 74 07/11/2021   ALT 10 08/21/2022   AST 13 08/21/2022   NA 141 08/21/2022   K 4.4 08/21/2022   CL 106 08/21/2022   CREATININE 0.83 08/21/2022   BUN 10 08/21/2022   CO2 29 08/21/2022   TSH 6.06 (H) 07/11/2021   INR 1.03 09/18/2018   HGBA1C 5.9 07/11/2021         Assessment & Plan:   Physical exam: Screening blood work  ordered Exercise  walking on  treadmill - has not been consistent with it - will work on it.    Weight  working on weight loss - discussed food choices-she typically eats pretty healthy, but overall is not eating and not which is likely the problem.  Will try to work on increasing exercise which may help improve her metabolism and her appetite Substance abuse  none   Reviewed recommended immunizations.   Health Maintenance  Topic Date Due   MAMMOGRAM  06/26/2020   COVID-19 Vaccine (6 - 2023-24 season) 09/29/2022 (Originally  04/06/2022)   PAP SMEAR-Modifier  06/27/2023   COLONOSCOPY (Pts 45-75yr Insurance coverage will need to be confirmed)  04/20/2026   DTaP/Tdap/Td (3 - Td or Tdap) 01/05/2030   INFLUENZA VACCINE  Completed   Hepatitis C Screening  Completed   HIV Screening  Completed   HPV VACCINES  Aged Out   Zoster Vaccines- Shingrix  Discontinued          See Problem List for Assessment and Plan of chronic medical problems.

## 2022-09-18 ENCOUNTER — Encounter: Payer: Self-pay | Admitting: Internal Medicine

## 2022-09-18 ENCOUNTER — Ambulatory Visit (INDEPENDENT_AMBULATORY_CARE_PROVIDER_SITE_OTHER): Payer: Federal, State, Local not specified - PPO | Admitting: Internal Medicine

## 2022-09-18 VITALS — BP 122/68 | HR 65 | Temp 98.7°F | Ht 68.0 in | Wt 293.0 lb

## 2022-09-18 DIAGNOSIS — R7303 Prediabetes: Secondary | ICD-10-CM

## 2022-09-18 DIAGNOSIS — I10 Essential (primary) hypertension: Secondary | ICD-10-CM | POA: Diagnosis not present

## 2022-09-18 DIAGNOSIS — Z0000 Encounter for general adult medical examination without abnormal findings: Secondary | ICD-10-CM

## 2022-09-18 DIAGNOSIS — G43809 Other migraine, not intractable, without status migrainosus: Secondary | ICD-10-CM

## 2022-09-18 DIAGNOSIS — R42 Dizziness and giddiness: Secondary | ICD-10-CM

## 2022-09-18 DIAGNOSIS — I773 Arterial fibromuscular dysplasia: Secondary | ICD-10-CM | POA: Diagnosis not present

## 2022-09-18 DIAGNOSIS — E559 Vitamin D deficiency, unspecified: Secondary | ICD-10-CM

## 2022-09-18 DIAGNOSIS — F419 Anxiety disorder, unspecified: Secondary | ICD-10-CM | POA: Diagnosis not present

## 2022-09-18 DIAGNOSIS — I341 Nonrheumatic mitral (valve) prolapse: Secondary | ICD-10-CM

## 2022-09-18 DIAGNOSIS — M069 Rheumatoid arthritis, unspecified: Secondary | ICD-10-CM

## 2022-09-18 DIAGNOSIS — M797 Fibromyalgia: Secondary | ICD-10-CM

## 2022-09-18 LAB — COMPREHENSIVE METABOLIC PANEL
ALT: 14 U/L (ref 0–35)
AST: 18 U/L (ref 0–37)
Albumin: 4.3 g/dL (ref 3.5–5.2)
Alkaline Phosphatase: 73 U/L (ref 39–117)
BUN: 7 mg/dL (ref 6–23)
CO2: 29 mEq/L (ref 19–32)
Calcium: 10 mg/dL (ref 8.4–10.5)
Chloride: 103 mEq/L (ref 96–112)
Creatinine, Ser: 0.71 mg/dL (ref 0.40–1.20)
GFR: 94.76 mL/min (ref >60.00)
Glucose, Bld: 95 mg/dL (ref 70–99)
Potassium: 4.4 mEq/L (ref 3.5–5.1)
Sodium: 137 mEq/L (ref 135–145)
Total Bilirubin: 0.5 mg/dL (ref 0.2–1.2)
Total Protein: 8.5 g/dL — ABNORMAL HIGH (ref 6.0–8.3)

## 2022-09-18 LAB — LIPID PANEL
Cholesterol: 123 mg/dL (ref 0–200)
HDL: 38.9 mg/dL — ABNORMAL LOW (ref >39.00)
LDL Cholesterol: 67 mg/dL (ref 0–99)
NonHDL: 84.35
Total CHOL/HDL Ratio: 3
Triglycerides: 88 mg/dL (ref 0.0–149.0)
VLDL: 17.6 mg/dL (ref 0.0–40.0)

## 2022-09-18 LAB — VITAMIN D 25 HYDROXY (VIT D DEFICIENCY, FRACTURES): VITD: 36.53 ng/mL (ref 30.00–100.00)

## 2022-09-18 LAB — TSH: TSH: 4.32 u[IU]/mL (ref 0.35–5.50)

## 2022-09-18 LAB — HEMOGLOBIN A1C: Hgb A1c MFr Bld: 5.9 % (ref 4.6–6.5)

## 2022-09-18 NOTE — Assessment & Plan Note (Signed)
Chronic Has chronic pain, associated brain fog-symptoms varied in intensity Continue Cymbalta 90 mg daily, gabapentin 300 mg at bedtime Will work on increasing activity

## 2022-09-18 NOTE — Assessment & Plan Note (Signed)
Chronic  Following with Dr Maudie Mercury in [place]

## 2022-09-18 NOTE — Telephone Encounter (Signed)
Please put in script for 2.5 liters oxygen with CPAP at night, with exertion, and with cluster headache.

## 2022-09-18 NOTE — Assessment & Plan Note (Signed)
Chronic Controlled, Stable Continue duloxetine 90 mg daily Speaking with a therapist once a week

## 2022-09-18 NOTE — Assessment & Plan Note (Signed)
Chronic Management per rheumatology On azathioprine, Enbrel

## 2022-09-18 NOTE — Telephone Encounter (Signed)
Mychart message sent by pt:  [facility] (supporting Chesley Mires, MD)Yesterday (9:52 AM)    Good morning [name]   I hope you are doing well. We previously discussed my desire to purchase outright a portable oxygen concentrator for use when away from home. I am preparing to purchase an Inogen POC today from [facility] (one of the authorized dealers) and just wanted to find out what works best regarding the prescription. Is that something I need to come pick up and fax to [facility] or will they be able to contact your office and receive the prescription needed to complete my purchase.    Thank you in advance for your assistance with this.    Sincerely, Jill Harper    [name], please advise.

## 2022-09-18 NOTE — Assessment & Plan Note (Signed)
History of vertigo Improved with vestibular PT Had 1 episode of vertigo since PT-none since

## 2022-09-18 NOTE — Assessment & Plan Note (Signed)
Chronic Management per Dr Jaynee Eagles Can not take triptans On emgality and nurtec

## 2022-09-18 NOTE — Assessment & Plan Note (Signed)
Chronic BP well controlled Continue telmisartan 20 mg daily, atenolol 25 mg daily cmp

## 2022-09-18 NOTE — Assessment & Plan Note (Signed)
Chronic Occasional palpitations or pinching like pain Continue atenolol 25 mg daily-can take an extra dose if needed

## 2022-09-18 NOTE — Assessment & Plan Note (Signed)
Chronic Check a1c Low sugar / carb diet Stressed regular exercise  

## 2022-09-18 NOTE — Assessment & Plan Note (Signed)
Chronic Taking vitamin D daily Check vitamin D level  

## 2022-09-19 ENCOUNTER — Ambulatory Visit: Payer: Federal, State, Local not specified - PPO | Attending: Physician Assistant | Admitting: Physician Assistant

## 2022-09-19 ENCOUNTER — Encounter: Payer: Self-pay | Admitting: Physician Assistant

## 2022-09-19 VITALS — BP 115/69 | HR 74 | Resp 20 | Ht 68.0 in | Wt 296.0 lb

## 2022-09-19 DIAGNOSIS — R768 Other specified abnormal immunological findings in serum: Secondary | ICD-10-CM | POA: Diagnosis not present

## 2022-09-19 DIAGNOSIS — Z79899 Other long term (current) drug therapy: Secondary | ICD-10-CM

## 2022-09-19 DIAGNOSIS — Z8669 Personal history of other diseases of the nervous system and sense organs: Secondary | ICD-10-CM

## 2022-09-19 DIAGNOSIS — M19041 Primary osteoarthritis, right hand: Secondary | ICD-10-CM

## 2022-09-19 DIAGNOSIS — M0579 Rheumatoid arthritis with rheumatoid factor of multiple sites without organ or systems involvement: Secondary | ICD-10-CM

## 2022-09-19 DIAGNOSIS — M3320 Polymyositis, organ involvement unspecified: Secondary | ICD-10-CM | POA: Diagnosis not present

## 2022-09-19 DIAGNOSIS — M19072 Primary osteoarthritis, left ankle and foot: Secondary | ICD-10-CM

## 2022-09-19 DIAGNOSIS — I773 Arterial fibromuscular dysplasia: Secondary | ICD-10-CM

## 2022-09-19 DIAGNOSIS — Z8679 Personal history of other diseases of the circulatory system: Secondary | ICD-10-CM

## 2022-09-19 DIAGNOSIS — G5603 Carpal tunnel syndrome, bilateral upper limbs: Secondary | ICD-10-CM

## 2022-09-19 DIAGNOSIS — I7300 Raynaud's syndrome without gangrene: Secondary | ICD-10-CM

## 2022-09-19 DIAGNOSIS — M797 Fibromyalgia: Secondary | ICD-10-CM

## 2022-09-19 DIAGNOSIS — M19042 Primary osteoarthritis, left hand: Secondary | ICD-10-CM

## 2022-09-19 DIAGNOSIS — J849 Interstitial pulmonary disease, unspecified: Secondary | ICD-10-CM

## 2022-09-19 DIAGNOSIS — L52 Erythema nodosum: Secondary | ICD-10-CM

## 2022-09-19 DIAGNOSIS — M62838 Other muscle spasm: Secondary | ICD-10-CM

## 2022-09-19 DIAGNOSIS — E559 Vitamin D deficiency, unspecified: Secondary | ICD-10-CM

## 2022-09-19 DIAGNOSIS — J479 Bronchiectasis, uncomplicated: Secondary | ICD-10-CM

## 2022-09-19 DIAGNOSIS — M19071 Primary osteoarthritis, right ankle and foot: Secondary | ICD-10-CM

## 2022-09-19 DIAGNOSIS — F410 Panic disorder [episodic paroxysmal anxiety] without agoraphobia: Secondary | ICD-10-CM | POA: Diagnosis not present

## 2022-09-19 DIAGNOSIS — R5383 Other fatigue: Secondary | ICD-10-CM

## 2022-09-19 MED ORDER — PREDNISONE 5 MG PO TABS: ORAL_TABLET | ORAL | 0 refills | Status: DC

## 2022-09-19 NOTE — Telephone Encounter (Signed)
Jill Mires, MD      09/18/22  4:05 PM Note Please put in script for 2.5 liters oxygen with CPAP at night, with exertion, and with cluster headache.      Order placed. Nothing further needed.

## 2022-09-19 NOTE — Progress Notes (Signed)
Office Visit Note  Patient: Jill Harper             Date of Birth: July 10, 1966           MRN: [id number]             PCP: Binnie Rail, MD Referring: Binnie Rail, MD Visit Date: 09/19/2022 Occupation: @GUAROCC$ @  Subjective:  Pain and swelling in right hand   History of Present Illness: Jill Harper is a 57 y.o. female with history of polymyositis, seropositive rheumatoid arthritis, and ILD.  Patient remains on Enbrel 50 mg sq injections once weekly (02/18/20-1st injection) and Imuran 100 mg by mouth daily.  Patient continues to tolerate combination therapy without any side effects.  She has not missed any doses recently.  Patient reports that about 10 days ago she started to have swelling in the left wrist.  She was having difficulty lifting even a piece of paper at the time.  She states that she tried taking Aleve and icing her wrist and noticed gradual improvement.  She then started to have swelling in the left hand and woke up Monday with swelling in her right hand.  She has been evaluated by her PCP who recommended further evaluation by [name].  She denies any signs or symptoms of a myositis flare.  She continues to have some myalgias and muscle tenderness which she attributes to fibromyalgia.  Her fibromyalgia has been more active with weather changes. She denies any recent infections.       Activities of Daily Living:  Patient reports morning stiffness for 5-10 minutes.   Patient Reports nocturnal pain.  Difficulty dressing/grooming: Reports Difficulty climbing stairs: Reports Difficulty getting out of chair: Denies Difficulty using hands for taps, buttons, cutlery, and/or writing: Reports  Review of Systems  Constitutional:  Positive for fatigue.  HENT:  Positive for mouth sores and mouth dryness. Negative for nose dryness.   Eyes:  Positive for dryness. Negative for pain and visual disturbance.  Respiratory:  Positive for shortness of breath. Negative for cough, hemoptysis and  difficulty breathing.   Cardiovascular:  Positive for palpitations. Negative for chest pain, hypertension and swelling in legs/feet.  Gastrointestinal:  Negative for blood in stool, constipation and diarrhea.  Endocrine: Negative for increased urination.  Genitourinary:  Negative for painful urination and involuntary urination.  Musculoskeletal:  Positive for joint pain, joint pain, joint swelling, myalgias, muscle weakness, morning stiffness, muscle tenderness and myalgias.  Skin:  Positive for color change and sensitivity to sunlight. Negative for pallor, rash, hair loss, nodules/bumps, skin tightness and ulcers.  Allergic/Immunologic: Positive for susceptible to infections.  Neurological:  Positive for headaches. Negative for numbness and weakness.  Hematological:  Negative for swollen glands.  Psychiatric/Behavioral:  Positive for sleep disturbance. Negative for depressed mood. The patient is nervous/anxious.     PMFS History:  Patient Active Problem List   Diagnosis Date Noted   Left arm pain 09/13/2022   Left arm swelling 09/13/2022   Right upper quadrant pain 08/15/2022   Palpitations-related to MVP 04/02/2022   Nocturnal hypoxia 01/16/2022   Fibromuscular dysplasia of wall of intracranial artery ([place]) 12/20/2021   Anxiety 09/06/2021   Tingling 06/28/2021   Occipital neuralgia of left side 123XX123   Metabolic syndrome X 123XX123   Entrapment of left ulnar nerve 07/27/2019   Right hand pain 07/27/2019   Bilateral carpal tunnel syndrome 07/21/2019   Costochondritis 03/16/2019   Rib pain on right side 03/16/2019   Prediabetes 11/27/2018  Fibromuscular dysplasia ([place]) 10/17/2018   Hypertension 10/17/2018   Bronchiectasis with (acute) exacerbation ([place]) 10/08/2018   OSA (obstructive sleep apnea) 08/20/2018   Rheumatoid arthritis ([place]) - Dr Estanislado Pandy 04/17/2018   ILD (interstitial lung disease) ([place]) 04/17/2018   Dyspnea 04/01/2018   Bronchiectasis ([place]) 09/02/2017    LVH (left ventricular hypertrophy), mild 06/02/2017   Mitral regurgitation 05/22/2017   Jo-1 antibody positive 01/29/2017   Primary osteoarthritis of both feet 01/29/2017   Primary osteoarthritis of both hands 01/29/2017   Left shoulder tendonitis 01/17/2017   Vitamin D deficiency 04/26/2016   Cough 07/18/2015   MVP (mitral valve prolapse)    Erythema nodosum    INCONTINENCE, URGE 05/26/2010   Migraine headache 02/15/2010   Fibromyalgia 06/20/2009   Vertigo 03/05/2008   Polymyositis ([place]) 02/11/2008   BRONCHIECTASIS 02/10/2008    Past Medical History:  Diagnosis Date   Abnormal liver function test    Bronchiectasis        Dyspnea    Fibromuscular dysplasia (HCC)    Fibromyalgia    Heart murmur    ILD (interstitial lung disease) ([place])    Joint pain    Menorrhagia    MIGRAINE HEADACHE    MVP (mitral valve prolapse)    Osteoarthritis    Polymyositis (HCC)    Dr [name]; chronic MTX   Pulmonary fibrosis ([place])    Rheumatoid arthritis ([place])    Sleep apnea    Vertigo    Vitamin B 12 deficiency    Vitamin D deficiency     Family History  Problem Relation Age of Onset   Diabetes Mother    Fibromyalgia Mother    Ulcers Mother    Heart failure Mother    Thyroid disease Mother    Obesity Mother    Multiple myeloma Father    Hypertension Father    Stroke Father    Lupus Sister    Other Sister        abdominal adhesions resulting in bowel obstruction   Migraines Sister    Heart disease Brother        bypass surgery   Rheum arthritis Sister    Multiple myeloma Sister    Hypertension Sister    Heart attack Sister    Diabetes Sister    Hypertension Sister    Rheum arthritis Sister    Diabetes Sister    Diabetes Brother    Headache Other        siblings with headaches but not diagnosed as migraines   Past Surgical History:  Procedure Laterality Date   ablation uterine  2010   CARDIAC CATHETERIZATION  2002   normal   DILATION AND CURETTAGE OF UTERUS   08-31-08   Dr [name]   IR ANGIO INTRA EXTRACRAN SEL COM CAROTID INNOMINATE BILAT MOD SED  09/18/2018   IR ANGIO VERTEBRAL SEL VERTEBRAL BILAT MOD SED  09/18/2018   IR US GUIDE VASC ACCESS RIGHT  09/18/2018   Social History   Social History Narrative   Exercise: trying to walk - limited by fatigue   Lives at home with husband    Right handed   Caffeine: none    Immunization History  Administered Date(s) Administered   Influenza Split 04/10/2011, 06/18/2012, 05/06/2013   Influenza Whole 05/19/2008, 05/15/2009, 06/01/2010   Influenza,inj,Quad PF,6+ Mos 04/20/2014, 05/26/2015, 05/31/2016, 04/05/2017, 06/16/2018, 04/22/2019, 04/22/2020   Influenza-Unspecified 04/11/2021, 04/02/2022   PFIZER(Purple Top)SARS-COV-2 Vaccination 10/27/2019, 11/19/2019   Pneumococcal Conjugate-13 01/29/2017   Pneumococcal Polysaccharide-23 08/07/2007, 06/16/2018  Td 07/12/2009   Tdap 01/06/2020   Unspecified SARS-COV-2 Vaccination 10/27/2019, 11/19/2019, 04/04/2021     Objective: Vital Signs: BP 115/69 (BP Location: Right Arm, Patient Position: Sitting, Cuff Size: Large)   Pulse 74   Resp 20   Ht 5' 8"$  (1.727 m)   Wt 296 lb (134.3 kg)   BMI 45.01 kg/m    Physical Exam Vitals and nursing note reviewed.  Constitutional:      Appearance: She is well-developed.  HENT:     Head: Normocephalic and atraumatic.  Eyes:     Conjunctiva/sclera: Conjunctivae normal.  Cardiovascular:     Rate and Rhythm: Normal rate and regular rhythm.     Heart sounds: Normal heart sounds.  Pulmonary:     Effort: Pulmonary effort is normal.     Breath sounds: Normal breath sounds.  Abdominal:     General: Bowel sounds are normal.     Palpations: Abdomen is soft.  Musculoskeletal:     Cervical back: Normal range of motion.  Skin:    General: Skin is warm and dry.     Capillary Refill: Capillary refill takes less than 2 seconds.  Neurological:     Mental Status: She is alert and oriented to person, place, and  time.  Psychiatric:        Behavior: Behavior normal.      Musculoskeletal Exam: C-spine has limited range of motion with lateral rotation.  She has trapezius muscle tension and tenderness bilaterally.  Shoulder joints and elbow joints have good range of motion.  Some tenderness and warmth of the left wrist.  Tenderness and synovitis over the right third MCP and left third and fourth MCP joints.  Hip joints have good range of motion with no groin pain.  Knee joints have good range of motion with no warmth or effusion.  Ankle joints have good range of motion with no joint tenderness.  Pedal edema noted bilaterally.  CDAI Exam: CDAI Score: 13  Patient Global: 5 mm; Provider Global: 5 mm Swollen: 4 ; Tender: 8  Joint Exam 09/19/2022      Right  Left  Wrist   Tender  Swollen Tender  MCP 2   Tender   Tender  MCP 3  Swollen Tender  Swollen Tender  MCP 4     Swollen Tender  IP      Tender     Investigation: No additional findings.  Imaging: VAS [name] UPPER EXTREMITY VENOUS DUPLEX  Result Date: 09/14/2022 UPPER VENOUS STUDY  Patient Name:  Jill Harper  Date of Exam:   09/13/2022 Medical Rec #: [id number]     Accession #:    [id number] Date of Birth: 04-07-1966      Patient Gender: F Patient Age:   16 years Exam Location:  [facility] Procedure:      VAS [name] UPPER EXTREMITY VENOUS DUPLEX Referring Phys: STACY BURNS --------------------------------------------------------------------------------  Indications: Left forearm swelling and pain for 8 days. Patient denies trauma, bugbites, chest pain, and SOB. Comparison Study: None Performing Technologist: Alecia Mackin RVT, RDCS (AE), RDMS  Examination Guidelines: A complete evaluation includes B-mode imaging, spectral Doppler, color Doppler, and power Doppler as needed of all accessible portions of each vessel. Bilateral testing is considered an integral part of a complete examination. Limited examinations for reoccurring indications may be performed as noted.   Right Findings: +----------+------------+---------+-----------+----------+-------+ RIGHT     CompressiblePhasicitySpontaneousPropertiesSummary +----------+------------+---------+-----------+----------+-------+ IJV           Full  Yes       Yes                      +----------+------------+---------+-----------+----------+-------+ Subclavian               Yes       Yes                      +----------+------------+---------+-----------+----------+-------+  Left Findings: +----------+------------+---------+-----------+----------+-------+ LEFT      CompressiblePhasicitySpontaneousPropertiesSummary +----------+------------+---------+-----------+----------+-------+ IJV           Full       Yes       Yes                      +----------+------------+---------+-----------+----------+-------+ Subclavian               Yes       Yes                      +----------+------------+---------+-----------+----------+-------+ Axillary      Full       Yes       Yes                      +----------+------------+---------+-----------+----------+-------+ Brachial      Full       Yes       Yes                      +----------+------------+---------+-----------+----------+-------+ Radial        Full       Yes       Yes                      +----------+------------+---------+-----------+----------+-------+ Ulnar         Full       Yes       Yes                      +----------+------------+---------+-----------+----------+-------+ Cephalic      Full                                          +----------+------------+---------+-----------+----------+-------+ Basilic       Full                                          +----------+------------+---------+-----------+----------+-------+  Findings reported to Dr. Quay Burow thru secure chat in EPIC at 11:00 am.  Summary:  Left: No evidence of deep vein thrombosis in the upper extremity. No evidence of superficial  vein thrombosis in the upper extremity.  *See table(s) above for measurements and observations.  Diagnosing physician: Orlie Pollen Electronically signed by Orlie Pollen on 09/14/2022 at 1:36:49 PM.    Final     Recent Labs: Lab Results  Component Value Date   WBC 8.5 08/21/2022   HGB 13.0 08/21/2022   PLT 287 08/21/2022   NA 137 09/18/2022   K 4.4 09/18/2022   CL 103 09/18/2022   CO2 29 09/18/2022   GLUCOSE 95 09/18/2022   BUN 7 09/18/2022   CREATININE 0.71 09/18/2022   BILITOT 0.5 09/18/2022   ALKPHOS 73 09/18/2022   AST 18 09/18/2022  ALT 14 09/18/2022   PROT 8.5 (H) 09/18/2022   ALBUMIN 4.3 09/18/2022   CALCIUM 10.0 09/18/2022   GFRAA 114 01/18/2021   QFTBGOLDPLUS NEGATIVE 02/14/2022    Speciality Comments: Prior therapy includes: MTX (d/c due to ILD progresssion)  Procedures:  No procedures performed Allergies: Sulfonamide derivatives    Assessment / Plan:     Visit Diagnoses: Polymyositis ([place]) - Dx by Dr. Charlestine Night 2007, CK 801, Jo-1 positive: She is not experiencing any signs or symptoms of a myositis flare.  She has clinically been doing well on Imuran as prescribed.  She remains on Imuran and Enbrel as combination therapy for management of rheumatoid arthritis.  She presents today experiencing a rheumatoid arthritis flare.  A low-dose short prednisone taper starting at 10 mg tapering by 2.5 mg every 4 days were sent to the pharmacy today to alleviate her current symptoms.  She is not currently experiencing any increased muscular weakness.  She had no difficulty rising from a seated position or raising her arms above her head.  CK was within the desirable range: 48 on 08/21/2022.  CK will continue to be monitored every 3 months.  Standing orders for CK remain in place. She will remain on the current treatment regimen.  She was advised to notify us if she develops signs or symptoms of a myositis flare.  She will follow-up in the office in 3 months or sooner if  needed.  High risk medication use - Enbrel 50 mg sq injections once weekly (02/18/20-1st injection) and Imuran 100 mg by mouth daily. d/c MTX due to progressioin of ILD. CMP updated yesterday on 09/18/2022.  CBC drawn on 08/21/2022.  Her next lab work will be due in April and every 3 months to monitor for drug toxicity. TB Gold negative on 02/14/2022. No recent or recurrent infections. Discussed the importance of holding Enbrel and Imuran if she develops signs or symptoms of an infection and to resume once the infection has completely cleared. Up to date with annual flu shot, shingrix series.   Yearly skin cancer screening recommended.   Rheumatoid arthritis involving multiple sites with positive rheumatoid factor (HCC) - +RF, +CCP: Patient presents today experiencing a flare of rheumatoid arthritis involving both hands.  She has tenderness and synovitis over several MCP joints as described above.  The flare initially started in the left wrist about 10 days ago and she continues to have some residual tenderness and warmth.  She has been taking Aleve as needed and using ice topically.  She remains on Enbrel 50 mg subcutaneous injections once weekly and Imuran 100 mg daily.  She has not missed any doses of these prescriptions recently.  A prednisone taper starting at 10 mg tapering by 2.5 mg every 4 days was sent to the pharmacy today.  Advised patient to take prednisone in the morning with food and avoid the use of NSAIDs.  She will remain on Enbrel and Imuran as prescribed.  She was advised to notify us if she continues to have recurrent flares.  Jo-1 antibody positive  ILD (interstitial lung disease) (HCC) - Followed by [name], NP at [facility].  Completed pulmonary rehab. Stable. She remains on Imuran as prescribed.   Bronchiectasis without complication ([place]) - Followed by [facility]-[name]. Using a smart vest twice daily and has noticed a significant improvement in her  symptoms.  No recent exacerbations.  Bilateral carpal tunnel syndrome - Asymtomatic currently.  Primary osteoarthritis of both hands: Patient presents today  experiencing a flare of rheumatoid arthritis in both hands.  A prednisone taper was sent to the pharmacy today. Discussed the importance of joint protection and muscle strengthening for underlying osteoarthritis.  Primary osteoarthritis of both feet: She is not experiencing any increased discomfort in her feet at this time.  She has good range of motion of both ankle joints with no joint tenderness.  Pedal edema was noted bilaterally.  She is wearing proper fitting shoes.  Fibromyalgia: She continues to experience interval myalgias and muscle tenderness due to fibromyalgia.  She has had more frequent episodes of fibromyalgia pain which she attributes to weather changes.  Discussed the importance of regular exercise and good sleep hygiene.  Trapezius muscle spasm: She has ongoing trapezius muscle tension and tenderness bilaterally.  She experiences muscle spasms intermittently.  Other fatigue: Stable.   Raynaud's syndrome without gangrene: She experiences intermittent symptoms of Raynaud's phenomenon.  No digital ulcerations or signs of gangrene were noted today.  Other medical conditions are listed as follows:  Fibromuscular dysplasia (HCC) - Multifocal fibromuscular dysplasia of the bilateral internal carotid arteries-confirmed by catheter-based angiography-Followed by Dr. Maudie Mercury.  Erythema nodosum  History of mitral valve prolapse - Dr. Maudie Mercury  History of migraine - Dr. Jaynee Eagles  Vitamin D deficiency: Vitamin D was 36.53 on 09/18/2022.  She plans to take vitamin D 10,000 units once weekly.  Orders: No orders of the defined types were placed in this encounter.  Meds ordered this encounter  Medications   predniSONE (DELTASONE) 5 MG tablet    Sig: Take 2 tablets by mouth daily x4 days, 1.5 mg daily x4 days, 1 tablet daily x4 days, half  tablet daily x4 days    Dispense:  20 tablet    Refill:  0     Follow-Up Instructions: Return in about 3 months (around 12/18/2022) for Rheumatoid arthritis, Polymyositis, ILD.   Ofilia Neas, PA-C  Note - This record has been created using Dragon software.  Chart creation errors have been sought, but may not always  have been located. Such creation errors do not reflect on  the standard of medical care.

## 2022-09-19 NOTE — Telephone Encounter (Signed)
Order placed. Nothing further needed. 

## 2022-09-19 NOTE — Telephone Encounter (Signed)
Called because they have not gotten a response from the message on Monday.  Please call or fax the information for the oxygen usage of patient.  CB# [phone], Fax# [phone]

## 2022-09-19 NOTE — Patient Instructions (Signed)
Standing Labs We placed an order today for your standing lab work.   Please have your standing labs drawn in April and every 3 months   Please have your labs drawn 2 weeks prior to your appointment so that the provider can discuss your lab results at your appointment.  Please note that you may see your imaging and lab results in [facility] before we have reviewed them. We will contact you once all results are reviewed. Please allow our office up to 72 hours to thoroughly review all of the results before contacting the office for clarification of your results.  Lab hours are:   Monday through Thursday from 8:00 am -12:30 pm and 1:00 pm-5:00 pm and Friday from 8:00 am-12:00 pm.  Please be advised, all patients with office appointments requiring lab work will take precedent over walk-in lab work.   Labs are drawn by [facility]. Please bring your co-pay at the time of your lab draw.  You may receive a bill from [facility] for your lab work.  Please note if you are on Hydroxychloroquine and and an order has been placed for a Hydroxychloroquine level, you will need to have it drawn 4 hours or more after your last dose.  If you wish to have your labs drawn at another location, please call the office 24 hours in advance so we can fax the orders.  The office is located at [address], [place], [place], [place] [address] No appointment is necessary.    If you have any questions regarding directions or hours of operation,  please call [phone].   As a reminder, please drink plenty of water prior to coming for your lab work. Thanks!  If you have signs or symptoms of an infection or start antibiotics: First, call your PCP for workup of your infection. Hold your medication through the infection, until you complete your antibiotics, and until symptoms resolve if you take the following: Injectable medication (Actemra, Benlysta, Cimzia, Cosentyx, Enbrel, Humira, Kevzara, Orencia, Remicade, Simponi,  Stelara, Taltz, Tremfya) Methotrexate Leflunomide (Arava) Mycophenolate (Cellcept) Morrie Sheldon, Olumiant, or Rinvoq  Vaccines You are taking a medication(s) that can suppress your immune system.  The following immunizations are recommended: Flu annually Covid-19  Td/Tdap (tetanus, diphtheria, pertussis) every 10 years Pneumonia (Prevnar 15 then Pneumovax 23 at least 1 year apart.  Alternatively, can take Prevnar 20 without needing additional dose) Shingrix: 2 doses from 4 weeks to 6 months apart  Please check with your PCP to make sure you are up to date.

## 2022-09-20 ENCOUNTER — Telehealth: Payer: Self-pay | Admitting: Pulmonary Disease

## 2022-09-21 NOTE — Telephone Encounter (Signed)
Lm for main clinic supply.

## 2022-09-21 NOTE — Telephone Encounter (Signed)
PCC's can you guys assist with this? Order placed to main clinic supply 09/19/22 shows pending.

## 2022-09-26 DIAGNOSIS — F410 Panic disorder [episodic paroxysmal anxiety] without agoraphobia: Secondary | ICD-10-CM | POA: Diagnosis not present

## 2022-09-28 DIAGNOSIS — J471 Bronchiectasis with (acute) exacerbation: Secondary | ICD-10-CM | POA: Diagnosis not present

## 2022-09-28 DIAGNOSIS — G4733 Obstructive sleep apnea (adult) (pediatric): Secondary | ICD-10-CM | POA: Diagnosis not present

## 2022-10-04 ENCOUNTER — Encounter: Payer: Self-pay | Admitting: Internal Medicine

## 2022-10-04 NOTE — Progress Notes (Signed)
Subjective:    Patient ID: Jill Harper, female    DOB: [birth year], 57 y.o.   MRN: [id number]      HPI Jill Harper is here for  Chief Complaint  Patient presents with   URI    Cough is getting worse  , fatigue started Saturday , recently took covid test and was negative and had some nasal congestion     She is here for an acute visit for cold symptoms.  She was around her mother-in-law and she had PNA and is still hospitalized.    Her symptoms started just over one week ago.  She is experiencing Decreased appetite, chills, low-grade fever, fatigue, nasal congestion, ear pain, rhinorrhea, sore throat, productive cough, shortness of breath and headaches.  Her cough has progressed in the past few days and she is now coughing up discolored mucus.  She has tried taking all of her pulmonary medications.  She did adjust her LifeVest for the bronchiectasis and that has helped some.   She sees pulmonary 3/7.      Medications and allergies reviewed with patient and updated if appropriate.  Current Outpatient Medications on File Prior to Visit  Medication Sig Dispense Refill   albuterol (PROVENTIL HFA) 108 (90 Base) MCG/ACT inhaler Inhale 2 puffs into the lungs 4 (four) times daily as needed. Reported on 09/03/2015 1 each 4   albuterol (PROVENTIL) (2.5 MG/3ML) 0.083% nebulizer solution Take 3 mLs (2.5 mg total) by nebulization every 6 (six) hours as needed for wheezing or shortness of breath. 120 vial 5   aspirin EC 81 MG tablet Take 81 mg by mouth daily.     atenolol (TENORMIN) 25 MG tablet Take 1 tablet (25 mg total) by mouth daily. 90 tablet 2   azaTHIOprine (IMURAN) 50 MG tablet TAKE 2 TABLETS BY MOUTH DAILY 60 tablet 2   benzonatate (TESSALON) 200 MG capsule Take 1 capsule (200 mg total) by mouth 3 (three) times daily as needed. 30 capsule 1   Cholecalciferol (VITAMIN D) 50 MCG (2000 UT) CAPS Take by mouth.     diclofenac Sodium (VOLTAREN) 1 % GEL APPLY 2 TO 4 GRAMS TOPICALLY TO AFFECTED  JOINT FOUR TIMES DAILY AS NEEDED 400 g 2   DULoxetine (CYMBALTA) 30 MG capsule TAKE ONE CAPSULE BY MOUTH DAILY FOR TOTAL OF 90 MG DAILY 30 capsule 3   DULoxetine (CYMBALTA) 60 MG capsule TAKE 1 CAPSULE(60 MG) BY MOUTH DAILY 90 capsule 1   ENBREL MINI 50 MG/ML SOCT INSERT 1 MINI CARTRIDGE INTO AUTOINJECTOR AND INJECT 50 MG UNDER THE SKIN EVERY 7 DAYS 12 mL 0   gabapentin (NEURONTIN) 100 MG capsule Take 3 capsules (300 mg total) by mouth at bedtime. 270 capsule 3   Galcanezumab-gnlm (EMGALITY) 120 MG/ML SOAJ ADMINISTER 1 ML UNDER THE SKIN EVERY 30 DAYS 3 mL 3   guaiFENesin (MUCINEX) 600 MG 12 hr tablet Take 2 tablets (1,200 mg total) by mouth 2 (two) times daily as needed for cough or to loosen phlegm. (Patient taking differently: Take 1,200 mg by mouth 2 (two) times daily.)     meclizine (ANTIVERT) 25 MG tablet Take 1 tablet (25 mg total) by mouth 3 (three) times daily as needed for dizziness. 30 tablet 0   metFORMIN (GLUCOPHAGE) 500 MG tablet TAKE 1 TABLET(500 MG) BY MOUTH TWICE DAILY WITH A MEAL 180 tablet 1   methocarbamol (ROBAXIN) 500 MG tablet Take by mouth as needed.     Multiple Vitamins-Minerals (MULTIVITAMIN GUMMIES ADULT PO) Take  2 each by mouth daily.     predniSONE (DELTASONE) 5 MG tablet Take 2 tablets by mouth daily x4 days, 1.5 mg daily x4 days, 1 tablet daily x4 days, half tablet daily x4 days 20 tablet 0   Rimegepant Sulfate (NURTEC) 75 MG TBDP DISSOLVE ONE TABLET BY MOUTH DAILY AS NEEDED FOR MIGRAINES. TAKE AS CLOSE TO ONSET OF MIGRAINE AS POSSIBLE. 1 TABLET DAILY MAXIMUM. 15 tablet 11   sodium chloride HYPERTONIC 3 % nebulizer solution Take by nebulization as needed for other. 750 mL 12   telmisartan (MICARDIS) 20 MG tablet TAKE 1 TABLET(20 MG) BY MOUTH DAILY 90 tablet 1   TRELEGY ELLIPTA 100-62.5-25 MCG/ACT AEPB INHALE 1 PUFF INTO THE LUNGS DAILY 60 each 5   No current facility-administered medications on file prior to visit.    Review of Systems  Constitutional:  Positive  for appetite change (decreased), chills, fatigue and fever (low grade).  HENT:  Positive for congestion (at times - clear), ear pain (left side - improved), rhinorrhea and sore throat (initially - improved).   Respiratory:  Positive for cough (productive of dark mucus) and shortness of breath. Negative for wheezing.   Gastrointestinal:  Negative for diarrhea.  Neurological:  Positive for headaches (initially). Negative for dizziness and light-headedness.       Objective:   Vitals:   10/05/22 1526  BP: 124/76  Pulse: 81  Temp: 98.4 F (36.9 C)  SpO2: 99%   BP Readings from Last 3 Encounters:  10/05/22 124/76  09/19/22 115/69  09/18/22 122/68   Wt Readings from Last 3 Encounters:  10/05/22 294 lb (133.4 kg)  09/19/22 296 lb (134.3 kg)  09/18/22 293 lb (132.9 kg)   Body mass index is 44.7 kg/m.    Physical Exam Constitutional:      General: She is not in acute distress.    Appearance: Normal appearance. She is not ill-appearing.  HENT:     Head: Normocephalic and atraumatic.     Right Ear: Tympanic membrane, ear canal and external ear normal.     Left Ear: Tympanic membrane, ear canal and external ear normal.     Mouth/Throat:     Mouth: Mucous membranes are moist.     Pharynx: No oropharyngeal exudate or posterior oropharyngeal erythema.  Eyes:     Conjunctiva/sclera: Conjunctivae normal.  Cardiovascular:     Rate and Rhythm: Normal rate and regular rhythm.  Pulmonary:     Effort: Pulmonary effort is normal. No respiratory distress.     Breath sounds: Rhonchi (with cough only  - not diffuse) present. No wheezing or rales.  Musculoskeletal:     Cervical back: Neck supple. No tenderness.  Lymphadenopathy:     Cervical: No cervical adenopathy.  Skin:    General: Skin is warm and dry.  Neurological:     Mental Status: She is alert.            Assessment & Plan:    URI, bronchiectasis with acute exacerbation: Acute Symptoms started approximately 1 week  ago and have progressed Concern for bacterial infection in addition to exacerbation of her bronchiectasis Start Z-Pak, Omnicef 300 mg twice daily x 10 days Continue inhalers, pulmonary medications, LifeVest She sees pulmonary next Thursday, but if her symptoms are worsening she will let me know we can consider switching to Levaquin

## 2022-10-05 ENCOUNTER — Ambulatory Visit: Payer: Federal, State, Local not specified - PPO | Admitting: Internal Medicine

## 2022-10-05 ENCOUNTER — Encounter: Payer: Self-pay | Admitting: Internal Medicine

## 2022-10-05 VITALS — BP 124/76 | HR 81 | Temp 98.4°F | Ht 68.0 in | Wt 294.0 lb

## 2022-10-05 DIAGNOSIS — J471 Bronchiectasis with (acute) exacerbation: Secondary | ICD-10-CM | POA: Diagnosis not present

## 2022-10-05 DIAGNOSIS — I10 Essential (primary) hypertension: Secondary | ICD-10-CM

## 2022-10-05 MED ORDER — AZITHROMYCIN 250 MG PO TABS: ORAL_TABLET | ORAL | 0 refills | Status: DC

## 2022-10-05 MED ORDER — CEFDINIR 300 MG PO CAPS: 300.0000 mg | ORAL_CAPSULE | Freq: Two times a day (BID) | ORAL | 0 refills | Status: DC

## 2022-10-05 NOTE — Patient Instructions (Addendum)
        Medications changes include :   zpak, omnicef     Return if symptoms worsen or fail to improve.

## 2022-10-05 NOTE — Assessment & Plan Note (Signed)
Chronic Blood pressure well-controlled Continue current medications-atenolol 25 mg daily, telmisartan 20 mg daily

## 2022-10-11 ENCOUNTER — Encounter (HOSPITAL_BASED_OUTPATIENT_CLINIC_OR_DEPARTMENT_OTHER): Payer: Self-pay | Admitting: Pulmonary Disease

## 2022-10-11 ENCOUNTER — Ambulatory Visit (HOSPITAL_BASED_OUTPATIENT_CLINIC_OR_DEPARTMENT_OTHER): Payer: Federal, State, Local not specified - PPO | Admitting: Pulmonary Disease

## 2022-10-11 ENCOUNTER — Ambulatory Visit: Payer: Federal, State, Local not specified - PPO | Admitting: Physician Assistant

## 2022-10-11 VITALS — BP 122/80 | HR 82 | Temp 98.4°F | Ht 68.0 in | Wt 282.6 lb

## 2022-10-11 DIAGNOSIS — J471 Bronchiectasis with (acute) exacerbation: Secondary | ICD-10-CM

## 2022-10-11 NOTE — Progress Notes (Signed)
[facility]  Chief Complaint  Patient presents with   Follow-up    Pt states she was seen by PCP 1 week ago after coming down with a virus and was placed on abx. Since then, she is still having complaints of cough with yellow, Zehner, to green colored phlegm that is occasionally clear. Pt also has SOB associated.    Constitutional:  BP 122/80 (BP Location: Right Arm, Patient Position: Sitting, Cuff Size: Large)   Pulse 82   Temp 98.4 F (36.9 C) (Oral)   Ht '5\' 8"'$  (1.727 m)   Wt 282 lb 9.6 oz (128.2 kg)   SpO2 100% Comment: RA  BMI 42.97 kg/m   Past Medical History:  Fibromyalgia, Migraine HA, MVP, OA, Fibromuscular dysplasia, Erythema nodosum, Vit D deficiency  Past Surgical History:  Her  has a past surgical history that includes Cardiac catheterization (2002); Dilation and curettage of uterus (08-31-08); ablation uterine (2010); IR ANGIO VERTEBRAL SEL VERTEBRAL BILAT MOD SED (09/18/2018); IR ANGIO INTRA EXTRACRAN SEL COM CAROTID INNOMINATE BILAT MOD SED (09/18/2018); and IR US Guide Vasc Access Right (09/18/2018).  Brief Summary:  Jill Harper is a 57 y.o. female with bronchiectasis in the setting of seropositive rheumatoid arthritis and polymyositis, and obstructive sleep apnea.      Subjective:   Several of her family members developed an upper respiratory infection about 2 weeks ago.  Her mother-in-law was hospitalized with pneumonia and bacteremia.  She saw her PCP and was started on zithromax and omnicef.  She does feel like she is getting better, but not fully recovered.  She has been using her chest vest more and this helps her clear phlegm.  Unable to tolerate flutter valve due to worsening headache.  Not having fever, chest pain, wheeze, or hemoptysis.  No issues using CPAP at night.  She has a POC and uses when she has a headache.    Physical Exam:   Appearance - well kempt   ENMT - no sinus tenderness, no oral exudate, no  LAN, Mallampati 3 airway, no stridor  Respiratory - equal breath sounds bilaterally, no wheezing or rales  CV - s1s2 regular rate and rhythm, no murmurs  Ext - no clubbing, no edema  Skin - no rashes  Psych - normal mood and affect        Pulmonary testing:  PFT 10/30/11 >> FEV1 3.31 (114%), FEV1% 85, TLC 6.06 (108%), DLCO 77%, no BD PFT 11/18/12 >> FEV1 3.18 (111%), FEV1% 84, TLC 5.34 (95%), DLCO 67%, no BD PFT 12/02/15 >> FEV1 2.96 (108%), FEV1% 92, TLC 4.95 (86%), DLCO 65%, no BD PFT 04/17/18 >> FEV1 2.46 (101%), FEV1% 90, DLCO 76% PFT 02/05/19 >> FEV1 2.30 (87%), FEV1% 89, TLC 4.21 (74%), DLCO 81%, no BD PFT 02/17/20 >> FEV1 2.18 (83%), FEV1% 90, TLC 4.22 (74%), DLCO 78% PFT 07/27/21 >> FEV1 2.41 (93%), FEV1% 89, TLC 4.04 (71%), DLCO 80%  Chest Imaging:  CT chest 03/23/05 >> patchy b/l lower lung ASD with cylindrical BTX CT chest 02/17/08 >> peripheral and basilar predominant subpleural GGO CT chest 12/01/08 >> no change HRCT chest 09/07/15 >> scattered GGO HRCT chest 04/15/18 >> patchy confluent subpleural and peripheral peribronchovascular reticulation and ground-glass opacity throughout both lungs with a strong basilar gradient, traction BTX HRCT chest 03/02/20 >> widespread patchy GGO, septal thickening, mild cylindrical BTX, craniocaudal gradient with mild progression, mild air trapping HRCT chest 10/25/21 >> patchy GGO, septal thickening, small amount of subpleural  reticulation, mild cylindrical BTX without signifcant change (fibrotic NSIP versus probable UIP)  Labs:  Quantiferon gold 01/31/17 >> negative Serology 01/31/17 >> RF 29, CCP 213, Jo-1 > 8; ANA, SCL 70, SSA/SSB, ENA Sm negative Quantiferon gold 01/31/17 >> negative Ig 01/31/17 >> IgG 1992, IgA 147, IgM 136  Sleep Tests:  HST 07/28/18 >> AHI 5, SpO2 low 83% ONO with CPAP 11/05/18 >> test time 6 hrs 24 min, baseline SpO2 90%, low SpO2 80%; spent 1 hr 15 min with SpO2 < 88% Auto CPAP 05/12/22 to 06/10/22 >> used on 26 of  30 nights with average 13 hrs 20 min.  Average AHI 4 with median CPAP 10 and 95 th percentile CPAP 13 cm H2O  Cardiac Tests:  Echo 11/22/20 >> EF 65 to 70%, mild LVH  Social History:  She  reports that she has never smoked. She has never been exposed to tobacco smoke. She has never used smokeless tobacco. She reports that she does not currently use alcohol. She reports that she does not use drugs.  Family History:  Her family history includes Diabetes in her brother, mother, sister, and sister; Fibromyalgia in her mother; Headache in an other family member; Heart attack in her sister; Heart disease in her brother; Heart failure in her mother; Hypertension in her father, sister, and sister; Lupus in her sister; Migraines in her sister; Multiple myeloma in her father and sister; Obesity in her mother; Other in her sister; Rheum arthritis in her sister and sister; Stroke in her father; Thyroid disease in her mother; Ulcers in her mother.      Assessment/Plan:   ILD with UIP pattern and Bronchiectasis in setting of RA and polymyositis. - recent exacerbation from upper respiratory infection - clinically improving - complete ABx from PCP - don't think she needs prednisone or chest xray at this time - continue mucinex, albuterol, hypertonic saline bid for now - continue chest vest bid for now - she isn't able to tolerate flutter valve due to worsening headache  Rheumatoid arthritis with positive RF and CCP, polymyositis. - followed by Dr. Bo Merino with rheumatology - maintained on imuran and enbrel   Obstructive sleep apnea. - she is compliant with CPAP and reports benefit from therapy - she uses [employer] for her DME - her current CPAP was ordered on 08/20/18 - continue auto CPAP 7 to 15 cm H2O   Nocturnal hypoxemia. - 2.5 liters oxygen at night with CPAP and prn during the day with dyspnea and headache  Migraine headache. - followed by Dr. Sarina Ill with [facility]  Time Spent Involved in Patient Care on Day of Examination:  37 minutes  Follow up:   Patient Instructions  Follow up in 4 months  Medication List:   Allergies as of 10/11/2022       Reactions   Sulfonamide Derivatives    REACTION: hives        Medication List        Accurate as of October 11, 2022  4:42 PM. If you have any questions, ask your nurse or doctor.          STOP taking these medications    azithromycin 250 MG tablet Commonly known as: ZITHROMAX Stopped by: Chesley Mires, MD   predniSONE 5 MG tablet Commonly known as: DELTASONE Stopped by: Chesley Mires, MD       TAKE these medications    albuterol (2.5 MG/3ML) 0.083% nebulizer solution Commonly known as: PROVENTIL Take 3 mLs (2.5  mg total) by nebulization every 6 (six) hours as needed for wheezing or shortness of breath.   albuterol 108 (90 Base) MCG/ACT inhaler Commonly known as: Proventil HFA Inhale 2 puffs into the lungs 4 (four) times daily as needed. Reported on 09/03/2015   aspirin EC 81 MG tablet Take 81 mg by mouth daily.   atenolol 25 MG tablet Commonly known as: TENORMIN Take 1 tablet (25 mg total) by mouth daily.   azaTHIOprine 50 MG tablet Commonly known as: IMURAN TAKE 2 TABLETS BY MOUTH DAILY   benzonatate 200 MG capsule Commonly known as: TESSALON Take 1 capsule (200 mg total) by mouth 3 (three) times daily as needed.   cefdinir 300 MG capsule Commonly known as: OMNICEF Take 1 capsule (300 mg total) by mouth 2 (two) times daily.   diclofenac Sodium 1 % Gel Commonly known as: VOLTAREN APPLY 2 TO 4 GRAMS TOPICALLY TO AFFECTED JOINT FOUR TIMES DAILY AS NEEDED   DULoxetine 60 MG capsule Commonly known as: CYMBALTA TAKE 1 CAPSULE(60 MG) BY MOUTH DAILY   DULoxetine 30 MG capsule Commonly known as: CYMBALTA TAKE ONE CAPSULE BY MOUTH DAILY FOR TOTAL OF 90 MG DAILY   Emgality 120 MG/ML Soaj Generic drug: Galcanezumab-gnlm ADMINISTER 1 ML UNDER THE SKIN EVERY 30  DAYS   Enbrel Mini 50 MG/ML Soct Generic drug: Etanercept INSERT 1 MINI CARTRIDGE INTO AUTOINJECTOR AND INJECT 50 MG UNDER THE SKIN EVERY 7 DAYS   gabapentin 100 MG capsule Commonly known as: NEURONTIN Take 3 capsules (300 mg total) by mouth at bedtime.   guaiFENesin 600 MG 12 hr tablet Commonly known as: Mucinex Take 2 tablets (1,200 mg total) by mouth 2 (two) times daily as needed for cough or to loosen phlegm. What changed: when to take this   meclizine 25 MG tablet Commonly known as: ANTIVERT Take 1 tablet (25 mg total) by mouth 3 (three) times daily as needed for dizziness.   metFORMIN 500 MG tablet Commonly known as: GLUCOPHAGE TAKE 1 TABLET(500 MG) BY MOUTH TWICE DAILY WITH A MEAL   methocarbamol 500 MG tablet Commonly known as: ROBAXIN Take by mouth as needed.   MULTIVITAMIN GUMMIES ADULT PO Take 2 each by mouth daily.   Nurtec 75 MG Tbdp Generic drug: Rimegepant Sulfate DISSOLVE ONE TABLET BY MOUTH DAILY AS NEEDED FOR MIGRAINES. TAKE AS CLOSE TO ONSET OF MIGRAINE AS POSSIBLE. 1 TABLET DAILY MAXIMUM.   sodium chloride HYPERTONIC 3 % nebulizer solution Take by nebulization as needed for other.   telmisartan 20 MG tablet Commonly known as: MICARDIS TAKE 1 TABLET(20 MG) BY MOUTH DAILY   Trelegy Ellipta 100-62.5-25 MCG/ACT Aepb Generic drug: Fluticasone-Umeclidin-Vilant INHALE 1 PUFF INTO THE LUNGS DAILY   Vitamin D 50 MCG (2000 UT) Caps Take by mouth.        Signature:  Chesley Mires, MD [place] Pager - [phone] 10/11/2022, 4:42 PM

## 2022-10-11 NOTE — Patient Instructions (Signed)
Follow up in 4 months 

## 2022-10-16 ENCOUNTER — Encounter: Payer: Self-pay | Admitting: Internal Medicine

## 2022-10-16 MED ORDER — AMOXICILLIN-POT CLAVULANATE 875-125 MG PO TABS: 1.0000 | ORAL_TABLET | Freq: Two times a day (BID) | ORAL | 0 refills | Status: AC

## 2022-10-16 NOTE — Addendum Note (Signed)
Addended by: Binnie Rail on: 10/16/2022 02:38 PM   Modules accepted: Orders

## 2022-10-17 DIAGNOSIS — F410 Panic disorder [episodic paroxysmal anxiety] without agoraphobia: Secondary | ICD-10-CM | POA: Diagnosis not present

## 2022-10-17 NOTE — Telephone Encounter (Signed)
[email or web address]

## 2022-10-24 DIAGNOSIS — F410 Panic disorder [episodic paroxysmal anxiety] without agoraphobia: Secondary | ICD-10-CM | POA: Diagnosis not present

## 2022-10-27 DIAGNOSIS — G4733 Obstructive sleep apnea (adult) (pediatric): Secondary | ICD-10-CM | POA: Diagnosis not present

## 2022-10-27 DIAGNOSIS — J471 Bronchiectasis with (acute) exacerbation: Secondary | ICD-10-CM | POA: Diagnosis not present

## 2022-10-31 DIAGNOSIS — F410 Panic disorder [episodic paroxysmal anxiety] without agoraphobia: Secondary | ICD-10-CM | POA: Diagnosis not present

## 2022-11-01 ENCOUNTER — Ambulatory Visit: Payer: Federal, State, Local not specified - PPO | Admitting: Adult Health

## 2022-11-01 ENCOUNTER — Encounter: Payer: Self-pay | Admitting: Adult Health

## 2022-11-01 ENCOUNTER — Telehealth: Payer: Self-pay | Admitting: *Deleted

## 2022-11-01 VITALS — BP 124/70 | HR 71 | Ht 68.0 in | Wt 297.2 lb

## 2022-11-01 DIAGNOSIS — G4485 Primary stabbing headache: Secondary | ICD-10-CM

## 2022-11-01 DIAGNOSIS — G43709 Chronic migraine without aura, not intractable, without status migrainosus: Secondary | ICD-10-CM

## 2022-11-01 NOTE — Progress Notes (Signed)
PATIENT: Jill Harper DOB: 1966/04/07  REASON FOR VISIT: follow up HISTORY FROM: patient  Chief Complaint  Patient presents with   Follow-up    Pt in 7 Pt here for Migraine f/u Pt has has questions about Nurtec Pt states insurance is only covering 10 tablets a year .      HISTORY OF PRESENT ILLNESS: Today 11/01/22:  Jill Harper is a 57 y.o. female with a history of migraine headaches and fibromuscular dysplasia. Returns today for follow-up.  Feels that Emgality is working well. Having issues getting nurtec- pharmacy is telling her only 10 tablets allowed in a year. On average about 3 migraines a month. Weather triggered. Get aura that consistent of tingling on the left side of face, difficulty process words. Has this happen about 4 times a month but no headache pain comes. Each one of her migraines (with pain) starts with this aura. Also having vertigo- went to vestibular rehab- only had 1 episode since then.   Gabapentin has been helpful for the sudden onset of stabbing headache. Has not had an episode where is awakes from sleep in pain.    05/01/22: Jill Harper is a 57 year old female with a history of migraine headaches and fibromuscular dysplasia.  She returns today for follow-up.  She feels that Verdell Face is working well although she states in the last few months she is having 6-8 headaches a month. The headache may last 2-3 days. Reports approximately 12-15 headache days a month. Uses nurtec for a migraine. She has an actual migraine about 5 times a month.  She states that she does not always take Nurtec at the onset of a migraine.  Sometimes she waits to see if the headache will get more severe.  She was also concerned about overusing the medication.  She returns today for an evaluation  08/14/21: Jill Harper is a 57 year old female with a history of migraine headaches.  She returns today for follow-up.  She states that her migraines have been under relatively good control.  She states is  very rare for her to have 1-2 headaches a month.  Emgality has been lasting at least 4 weeks.  Relpax and Nurtec to offer her benefit if she has to take them.  Patient was getting stabbing headaches during the night.  She states that these episodes have also reduced.  She states on occasion she will have a mild headache later in the evening.  This past Friday and Saturday she did wake up early morning with a stabbing headache.  She did use Relpax with good benefit.  She is inquiring if gabapentin can be increased.  Patient has FMD.  She states that in a support group she is part of imaging a full body scan to identify any other areas that has been affected by FMD.  She is curious if this needs to be completed.  She has an appointment next month with Dr. Delynn Flavin.  02/07/21: Jill Harper is a 57 year old female with a history of migraine headaches.  She returns today for follow-up.  She reports that she has approximately 2 migraines a week.  She typically can take Nurtec or Relpax and the headache resolves fairly quickly.  She uses Ajovy for preventative therapy however this is not covered through her insurance she has been using a co-pay savings card.  She would like to switch to [employer] which is covered through her insurance.  She states that taking gabapentin at bedtime has reduced the severity and  frequency of the stabbing headaches that she was receiving.  She does report that she often gets them during the day as well.  At least 2 times a week.  She states that it only lasts for several minutes and afterwards she may feel a little foggy in her head will be tender but that resolves and she is able to carry on with her day.  She does use oxygen at night.  She returns today for an evaluation.  11/03/20: Jill Harper is a 57 year old female with a history of migraine headaches.  She is currently taking Ajovy every 3 weeks.  She has Relpax and Nurtec that she rotates nightly.  Reports that she has sharp shooting pains  in the left parietal region that radiates to the temporal region.  This only occurs at night usually wakes her up from sleep.  She has had this type of discomfort before however it has become more frequent recently.  She states that it only lasts for several seconds to minutes before it resolves.  Patient states that she began taking Nurtec every other night and reports that when she took Nurtec she did not wake up with this discomfort.  She states that she did not added Relpax and will take it on the nights that she did not take Nurtec and this also eliminated the discomfort at night. the patient wears a CPAP machine with supplemental oxygen at night managed by [name].  She states during the day she may have 3 to 4 days a week that she has a mild headache.  She states that the severity has definitely improved.  She typically does not take anything for these headaches.  She reports that sometimes the headaches can last all day long.  She does feel that Ajovy has improved the severity of her headaches.    HISTORY ,(copied from Dr. Cathren Laine note)  When she has a headache/migraine the relpax works. She may have to take 2 doses. Sometimes tylenol will work alone. She has weired feeling and stabbing pain sometimes and lasts a few minutes in the left parietal area. She feels the Ajovy is not as effective, wears out, we discussed changing to Emgality or possibly taking Ajovy q3weeks, gave her sampes, she will try Ajovt q3weeks. Try Nurtec.      Interval history September 23, 2019: Patient here for follow-up, she was last seen in December for hand pain and EMG nerve conduction study did show carpal tunnel syndrome.  PMHx vitamin B12 deficiency, sleep apnea, rheumatoid arthritis, pulmonary fibrosis, polymyositis, osteoarthritis, migraines, joint pain, interstitial lung disease, fibromyalgia, fibromuscular dysplasia, bronchiectasis and abnormal liver function test. She saw Dr. Donzetta Matters and vein in vascular for  discoloration of her skin, she gets blue tips of fingers and nails, diagnosed with raynaud's phenomenon related to her RA, no vascular intervention, reviewed his notes and discussed with patient as above. She saw Dr. Mervin Hack for the CTS and they are going to monitor, the symptoms have subsided and her symptoms were slight atypical for CTS on the right, she had ulnar neuropathy on the left and she noticed she did have symptoms and she was given prednisone which helped but it still some and goes, Ajovy working for migraines not daily headaches, she can have an occassional headache and it generally subsides on its own occipital on the left feels hot.    Interval history 07/20/2019: Patient here for a new chief complaint as requested by Dr. Estanislado Pandy for hand pain. PMHx vitamin B12 deficiency,  sleep apnea, rheumatoid arthritis, pulmonary fibrosis, polymyositis, osteoarthritis, migraines, joint pain, interstitial lung disease, fibromyalgia, fibromuscular dysplasia, bronchiectasis and abnormal liver function test.  In August or September, she had hand pain. Her hands felt cool, right > left, she started getting cramping in the right hand, not significantly painful just cramped, difficult time straightening it, massage helped. A month later it happened again, more painful, and she could see it "drawing", started getting colder to the touch, she woke up with excruciating pain in the hand and forearm ventrally and she was cramped again. She took 2 alleve. Severe, some numbness, pain felt like tightness, severe and visibly "drawing" up. Massaging helped finger by finger. MRI cervical spine was normal.  She has weakness in her hands. Progressively worsening. Pain in both shoulders. Some numbness in the first 4 digits. No prior diagnosis of CTS. No injury. She also felt a big knot on the inside of her wrist. Like a muscle spasm. No other focal neurologic deficits, associated symptoms, inciting events or modifiable factors.      Personally reviewed MRI cervical spine 07/08/2019: Normal   Cbc/cmp normal 03/2019   Interval history 06/08/2019: Carotid arteries on CTA c/w FMD. Discussed Fibromuscular Dysplasia, sent to Dr. Estanislado Pandy and being followed there. She has sharp pain in the back of the head, stops her because it is so severe, so painful it stops her in her tracks. Leaves her weak afterwards. Can go weeks without it. It is random, unknown what triggers, brief, severe. Also continues to have migraines, left sided, pulsating, pounding, contsant pain, behind the eye, feels she may get ptosis, no lacrimation or rhinorrhea. She has light and sound sensitivity. She has daily headaches, no medication overuse, no aura. She has 8 migraine days a month with moderate or severe quality and can last 24-72 hours ongoing for > year at this frequency and severity and quality. For FMD and BO meds and a daily asa. Tried atenolol, relpax, telmisartan, verapamil, nortriptyline, topamax. She has some brain fog, discussed normal aging, chronic pain and lifestyle. She has had neck pain, she has numbness and tingling in the left hand and arm, ongoing for >  Months, failed conservative measures, under the care of a physician.     HPI:  Jill Harper is a 57 y.o. female here as requested by Dr. Quay Harper for headaches. PMHx RA, bronchiectasis, polymyositis on imuran follows with Rheumatology, never smoked, interstitial lung disease, fibromyalgia, migraines, mitral valve prolapse, abnormal liver function test. She was supposed to have a sleep study but did not complete that (per notes [name]).  She reports she has headaches that come on during exertion and dissipate without medication when she slows down or alters activities. These are different from her usual migraines. She just had sleep test a few weeks ago, snoring, stops breathing. She does not have the results. She wakes up at night feeling like she can't breathe. She wakes with with "new" headache in  the setting of sleep problems for at least 5-6 months. She may have the same headache later in the day. The headache is a fogginedd across  the forehead and more on the left.She wakes with this headache in the setting if snoring and lung problems. The headache is worse with exertion, walking and talking, worse with valsalva and also with sex/orgasm. Rest helps and sitting still and it dissipates slowly. Can be moderately severe. Here with husband who provides much information.   Headache medications used: Atenolol, Flexeril, Relpax,  Reviewed notes, labs and imaging from outside physicians, which showed:   Personally reviewed images from 2013 and agree with the following    Reviewed [name] notes.  Last time she was seen was 07/28/2018.  Patient has a history of rheumatoid arthritis, polymyositis followed in rheumatology.  Also bronchiectasis.  She was supposed to have a sleep study that was previously ordered but she did not.  Epworth Sleepiness Scale 7 06/16/2018.   ANA negative, rheumatoid factor XX 9, anti-CCP 213, SCL 70, SSA SSB negative, Jo 1+.  Patient's last echocardiogram 05/29/2017 with ejection fraction 60 to 65%, moderate left ventricular hypertrophy, grade 1 DD.  CT of the sinuses were negative for acute disease.    Called Dr. Juanetta Gosling office and requested results of sleep testing    REVIEW OF SYSTEMS: Out of a complete 14 system review of symptoms, the patient complains only of the following symptoms, and all other reviewed systems are negative.  ALLERGIES: Allergies  Allergen Reactions   Sulfonamide Derivatives     REACTION: hives    HOME MEDICATIONS: Outpatient Medications Prior to Visit  Medication Sig Dispense Refill   albuterol (PROVENTIL HFA) 108 (90 Base) MCG/ACT inhaler Inhale 2 puffs into the lungs 4 (four) times daily as needed. Reported on 09/03/2015 1 each 4   albuterol (PROVENTIL) (2.5 MG/3ML) 0.083% nebulizer solution Take 3 mLs (2.5 mg total) by  nebulization every 6 (six) hours as needed for wheezing or shortness of breath. 120 vial 5   aspirin EC 81 MG tablet Take 81 mg by mouth daily.     atenolol (TENORMIN) 25 MG tablet Take 1 tablet (25 mg total) by mouth daily. 90 tablet 2   azaTHIOprine (IMURAN) 50 MG tablet TAKE 2 TABLETS BY MOUTH DAILY 60 tablet 2   benzonatate (TESSALON) 200 MG capsule Take 1 capsule (200 mg total) by mouth 3 (three) times daily as needed. 30 capsule 1   Cholecalciferol (VITAMIN D) 50 MCG (2000 UT) CAPS Take by mouth.     diclofenac Sodium (VOLTAREN) 1 % GEL APPLY 2 TO 4 GRAMS TOPICALLY TO AFFECTED JOINT FOUR TIMES DAILY AS NEEDED 400 g 2   DULoxetine (CYMBALTA) 30 MG capsule TAKE ONE CAPSULE BY MOUTH DAILY FOR TOTAL OF 90 MG DAILY 30 capsule 3   DULoxetine (CYMBALTA) 60 MG capsule TAKE 1 CAPSULE(60 MG) BY MOUTH DAILY 90 capsule 1   ENBREL MINI 50 MG/ML SOCT INSERT 1 MINI CARTRIDGE INTO AUTOINJECTOR AND INJECT 50 MG UNDER THE SKIN EVERY 7 DAYS 12 mL 0   gabapentin (NEURONTIN) 100 MG capsule Take 3 capsules (300 mg total) by mouth at bedtime. 270 capsule 3   Galcanezumab-gnlm (EMGALITY) 120 MG/ML SOAJ ADMINISTER 1 ML UNDER THE SKIN EVERY 30 DAYS 3 mL 3   guaiFENesin (MUCINEX) 600 MG 12 hr tablet Take 2 tablets (1,200 mg total) by mouth 2 (two) times daily as needed for cough or to loosen phlegm. (Patient taking differently: Take 1,200 mg by mouth 2 (two) times daily.)     meclizine (ANTIVERT) 25 MG tablet Take 1 tablet (25 mg total) by mouth 3 (three) times daily as needed for dizziness. 30 tablet 0   metFORMIN (GLUCOPHAGE) 500 MG tablet TAKE 1 TABLET(500 MG) BY MOUTH TWICE DAILY WITH A MEAL 180 tablet 1   methocarbamol (ROBAXIN) 500 MG tablet Take by mouth as needed.     Multiple Vitamins-Minerals (MULTIVITAMIN GUMMIES ADULT PO) Take 2 each by mouth daily.     Rimegepant Sulfate (NURTEC) 75 MG  TBDP DISSOLVE ONE TABLET BY MOUTH DAILY AS NEEDED FOR MIGRAINES. TAKE AS CLOSE TO ONSET OF MIGRAINE AS POSSIBLE. 1 TABLET  DAILY MAXIMUM. 15 tablet 11   sodium chloride HYPERTONIC 3 % nebulizer solution Take by nebulization as needed for other. 750 mL 12   telmisartan (MICARDIS) 20 MG tablet TAKE 1 TABLET(20 MG) BY MOUTH DAILY 90 tablet 1   TRELEGY ELLIPTA 100-62.5-25 MCG/ACT AEPB INHALE 1 PUFF INTO THE LUNGS DAILY 60 each 5   No facility-administered medications prior to visit.    PAST MEDICAL HISTORY: Past Medical History:  Diagnosis Date   Abnormal liver function test    Bronchiectasis        Dyspnea    Fibromuscular dysplasia (HCC)    Fibromyalgia    Heart murmur    ILD (interstitial lung disease) ([place])    Joint pain    Menorrhagia    MIGRAINE HEADACHE    MVP (mitral valve prolapse)    Osteoarthritis    Polymyositis (HCC)    Dr [name]; chronic MTX   Pulmonary fibrosis (HCC)    Rheumatoid arthritis ([place])    Sleep apnea    Vertigo    Vitamin B 12 deficiency    Vitamin D deficiency     PAST SURGICAL HISTORY: Past Surgical History:  Procedure Laterality Date   ablation uterine  2010   CARDIAC CATHETERIZATION  2002   normal   DILATION AND CURETTAGE OF UTERUS  08-31-08   Dr [name]   IR [facility] SEL COM CAROTID INNOMINATE BILAT MOD SED  09/18/2018   IR ANGIO VERTEBRAL SEL VERTEBRAL BILAT MOD SED  09/18/2018   IR US GUIDE VASC ACCESS RIGHT  09/18/2018    FAMILY HISTORY: Family History  Problem Relation Age of Onset   Diabetes Mother    Fibromyalgia Mother    Ulcers Mother    Heart failure Mother    Thyroid disease Mother    Obesity Mother    Multiple myeloma Father    Hypertension Father    Stroke Father    Lupus Sister    Other Sister        abdominal adhesions resulting in bowel obstruction   Migraines Sister    Heart disease Brother        bypass surgery   Rheum arthritis Sister    Multiple myeloma Sister    Hypertension Sister    Heart attack Sister    Diabetes Sister    Hypertension Sister    Rheum arthritis Sister    Diabetes Sister     Diabetes Brother    Headache Other        siblings with headaches but not diagnosed as migraines    SOCIAL HISTORY: Social History   Socioeconomic History   Marital status: Married    Spouse name: Katonya Scoble   Number of children: 1   Years of education: Not on file   Highest education level: High school graduate  Occupational History   Occupation: Compliant and Injury Clerk  Tobacco Use   Smoking status: Never    Passive exposure: Never   Smokeless tobacco: Never   Tobacco comments:    Married, lives with spouse. 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  Vaping Use   Vaping Use: Never used  Substance and Sexual Activity   Alcohol use: Not Currently    Comment: maybe a wine cooler once every other year   Drug use: Never  Sexual activity: Yes    Birth control/protection: Surgical  Other Topics Concern   Not on file  Social History Narrative   Exercise: trying to walk - limited by fatigue   Lives at home with husband    Right handed   Caffeine: none    Social Determinants of Health   Financial Resource Strain: Not on file  Food Insecurity: Not on file  Transportation Needs: Not on file  Physical Activity: Not on file  Stress: Not on file  Social Connections: Not on file  Intimate Partner Violence: Not on file      PHYSICAL EXAM  Vitals:   11/01/22 1109  BP: 124/70  Pulse: 71  Weight: 297 lb 3.2 oz (134.8 kg)  Height: 5\' 8"  (1.727 m)    Body mass index is 45.19 kg/m.  Generalized: Well developed, in no acute distress   Neurological examination  Mentation: Alert oriented to time, place, history taking. Follows all commands speech and language fluent Cranial nerve II-XII: Pupils were equal round reactive to light. Extraocular movements were full, visual field were full on confrontational test. Facial sensation and strength were normal. . Head turning and shoulder shrug  were normal and symmetric. Motor: The motor testing  reveals 5 over 5 strength of all 4 extremities. Good symmetric motor tone is noted throughout.  Sensory: Sensory testing is intact to soft touch on all 4 extremities. No evidence of extinction is noted.  Coordination: Cerebellar testing reveals good finger-nose-finger and heel-to-shin bilaterally.  Gait and station: Gait is normal.    DIAGNOSTIC DATA (LABS, IMAGING, TESTING) - I reviewed patient records, labs, notes, testing and imaging myself where available.  Lab Results  Component Value Date   WBC 8.5 08/21/2022   HGB 13.0 08/21/2022   HCT 39.2 08/21/2022   MCV 85.0 08/21/2022   PLT 287 08/21/2022      Component Value Date/Time   NA 137 09/18/2022 1615   NA 141 10/12/2019 1432   K 4.4 09/18/2022 1615   CL 103 09/18/2022 1615   CO2 29 09/18/2022 1615   GLUCOSE 95 09/18/2022 1615   BUN 7 09/18/2022 1615   BUN 11 10/12/2019 1432   CREATININE 0.71 09/18/2022 1615   CREATININE 0.83 08/21/2022 1521   CALCIUM 10.0 09/18/2022 1615   PROT 8.5 (H) 09/18/2022 1615   PROT 7.7 10/12/2019 1432   ALBUMIN 4.3 09/18/2022 1615   ALBUMIN 4.6 10/12/2019 1432   AST 18 09/18/2022 1615   ALT 14 09/18/2022 1615   ALKPHOS 73 09/18/2022 1615   BILITOT 0.5 09/18/2022 1615   BILITOT 0.3 10/12/2019 1432   GFRNONAA 98 01/18/2021 1626   GFRAA 114 01/18/2021 1626   Lab Results  Component Value Date   CHOL 123 09/18/2022   HDL 38.90 (L) 09/18/2022   LDLCALC 67 09/18/2022   TRIG 88.0 09/18/2022   CHOLHDL 3 09/18/2022   Lab Results  Component Value Date   HGBA1C 5.9 09/18/2022   Lab Results  Component Value Date   [id number] (H) 07/11/2021   Lab Results  Component Value Date   TSH 4.32 09/18/2022      ASSESSMENT AND PLAN 57 y.o. year old female  has a past medical history of Abnormal liver function test, Bronchiectasis, Dyspnea, Fibromuscular dysplasia (HCC), Fibromyalgia, Heart murmur, ILD (interstitial lung disease) ([place]), Joint pain, Menorrhagia, MIGRAINE HEADACHE, MVP  (mitral valve prolapse), Osteoarthritis, Polymyositis ([place]), Pulmonary fibrosis ([place]), Rheumatoid arthritis ([place]), Sleep apnea, Vertigo, Vitamin B 12 deficiency, and Vitamin D deficiency. here  with:  1.  Migraine headache   --Continue Emgality 120 mg monthly injection --Continue Nurtec for abortive therapy.will call pharmacy to verify -- We will see if this is beneficial if not we may need to change her preventative medicine in the future -- Not a candidate for triptans due to diagnosis of FMD  2.  Stabbing headache  -- Increase gabapentin to 300 mg at bedtime   Follow-up in 6 months or sooner if needed   Ward Givens, MSN, NP-C 11/01/2022, 11:12 AM [facility] [address], [place], [place] [address] [phone]

## 2022-11-04 ENCOUNTER — Encounter: Payer: Self-pay | Admitting: Internal Medicine

## 2022-11-04 ENCOUNTER — Ambulatory Visit (HOSPITAL_COMMUNITY)
Admission: RE | Admit: 2022-11-04 | Discharge: 2022-11-04 | Disposition: A | Discharge status: Home / Self Care | Payer: Federal, State, Local not specified - PPO | Source: Ambulatory Visit | Attending: Emergency Medicine | Admitting: Emergency Medicine

## 2022-11-04 ENCOUNTER — Encounter (HOSPITAL_COMMUNITY): Payer: Self-pay

## 2022-11-04 VITALS — BP 124/82 | HR 81 | Temp 98.2°F | Resp 18

## 2022-11-04 DIAGNOSIS — Z9981 Dependence on supplemental oxygen: Secondary | ICD-10-CM | POA: Diagnosis not present

## 2022-11-04 DIAGNOSIS — J849 Interstitial pulmonary disease, unspecified: Secondary | ICD-10-CM | POA: Diagnosis not present

## 2022-11-04 DIAGNOSIS — U071 COVID-19: Secondary | ICD-10-CM | POA: Diagnosis not present

## 2022-11-04 MED ORDER — PAXLOVID (300/100) 20 X 150 MG & 10 X 100MG PO TBPK: 3.0000 | ORAL_TABLET | Freq: Two times a day (BID) | ORAL | 0 refills | Status: AC

## 2022-11-04 MED ORDER — BENZONATATE 100 MG PO CAPS: 100.0000 mg | ORAL_CAPSULE | Freq: Three times a day (TID) | ORAL | 0 refills | Status: DC

## 2022-11-04 NOTE — Discharge Instructions (Addendum)
Please start taking the Paxlovid as advised via the packaging and start this as soon as possible.  You take the [facility] as needed for cough.  You can alternate between Tylenol and ibuprofen as needed for fever, aches and pains.  Please ensure you are drinking plenty of water, you can do warm saline gargles, sleep with a humidifier, and do tea with warm honey if you develop sore throat.   Please do not take your Rimegepant (Nurtec) or your RA medications with Paxlovid and discuss with your rheumatologist on Monday when you should restart your RA medications.  Please return to clinic or seek immediate care if you develop worsening of shortness of breath, high fever despite medication, chest pain, wheezing or any worsening of condition.

## 2022-11-04 NOTE — ED Triage Notes (Signed)
Pt reports that Friday started having body aches, lower back pain, cough that is more productive and different than normal cough, chills, congestion, fatigue. Home covid test positive on Saturday. Called PCP on call line and triage nurse instructed patient to go be seen for positive test before can get prescription for Paxlovid.  Pt has multiple medical problems and on 3 L oxygen via nasal canula that normally wears at night and as needed but since Friday has to use consistently.  Taking Mucinex, Tylenol and Voltaren gel.

## 2022-11-04 NOTE — Progress Notes (Deleted)
Subjective:    Patient ID: Jill Harper, female    DOB: 26-Aug-1965, 57 y.o.   MRN: [id number]      HPI Jill Harper is here for No chief complaint on file.   Covid pos 3/30.       Medications and allergies reviewed with patient and updated if appropriate.  Current Outpatient Medications on File Prior to Visit  Medication Sig Dispense Refill   albuterol (PROVENTIL HFA) 108 (90 Base) MCG/ACT inhaler Inhale 2 puffs into the lungs 4 (four) times daily as needed. Reported on 09/03/2015 1 each 4   albuterol (PROVENTIL) (2.5 MG/3ML) 0.083% nebulizer solution Take 3 mLs (2.5 mg total) by nebulization every 6 (six) hours as needed for wheezing or shortness of breath. 120 vial 5   aspirin EC 81 MG tablet Take 81 mg by mouth daily.     atenolol (TENORMIN) 25 MG tablet Take 1 tablet (25 mg total) by mouth daily. 90 tablet 2   azaTHIOprine (IMURAN) 50 MG tablet TAKE 2 TABLETS BY MOUTH DAILY 60 tablet 2   benzonatate (TESSALON) 200 MG capsule Take 1 capsule (200 mg total) by mouth 3 (three) times daily as needed. 30 capsule 1   Cholecalciferol (VITAMIN D) 50 MCG (2000 UT) CAPS Take by mouth.     diclofenac Sodium (VOLTAREN) 1 % GEL APPLY 2 TO 4 GRAMS TOPICALLY TO AFFECTED JOINT FOUR TIMES DAILY AS NEEDED 400 g 2   DULoxetine (CYMBALTA) 30 MG capsule TAKE ONE CAPSULE BY MOUTH DAILY FOR TOTAL OF 90 MG DAILY 30 capsule 3   DULoxetine (CYMBALTA) 60 MG capsule TAKE 1 CAPSULE(60 MG) BY MOUTH DAILY 90 capsule 1   ENBREL MINI 50 MG/ML SOCT INSERT 1 MINI CARTRIDGE INTO AUTOINJECTOR AND INJECT 50 MG UNDER THE SKIN EVERY 7 DAYS 12 mL 0   gabapentin (NEURONTIN) 100 MG capsule Take 3 capsules (300 mg total) by mouth at bedtime. 270 capsule 3   Galcanezumab-gnlm (EMGALITY) 120 MG/ML SOAJ ADMINISTER 1 ML UNDER THE SKIN EVERY 30 DAYS 3 mL 3   guaiFENesin (MUCINEX) 600 MG 12 hr tablet Take 2 tablets (1,200 mg total) by mouth 2 (two) times daily as needed for cough or to loosen phlegm. (Patient taking differently: Take  1,200 mg by mouth 2 (two) times daily.)     meclizine (ANTIVERT) 25 MG tablet Take 1 tablet (25 mg total) by mouth 3 (three) times daily as needed for dizziness. 30 tablet 0   metFORMIN (GLUCOPHAGE) 500 MG tablet TAKE 1 TABLET(500 MG) BY MOUTH TWICE DAILY WITH A MEAL 180 tablet 1   methocarbamol (ROBAXIN) 500 MG tablet Take by mouth as needed.     Multiple Vitamins-Minerals (MULTIVITAMIN GUMMIES ADULT PO) Take 2 each by mouth daily.     Rimegepant Sulfate (NURTEC) 75 MG TBDP DISSOLVE ONE TABLET BY MOUTH DAILY AS NEEDED FOR MIGRAINES. TAKE AS CLOSE TO ONSET OF MIGRAINE AS POSSIBLE. 1 TABLET DAILY MAXIMUM. 15 tablet 11   sodium chloride HYPERTONIC 3 % nebulizer solution Take by nebulization as needed for other. 750 mL 12   telmisartan (MICARDIS) 20 MG tablet TAKE 1 TABLET(20 MG) BY MOUTH DAILY 90 tablet 1   TRELEGY ELLIPTA 100-62.5-25 MCG/ACT AEPB INHALE 1 PUFF INTO THE LUNGS DAILY 60 each 5   No current facility-administered medications on file prior to visit.    Review of Systems     Objective:  There were no vitals filed for this visit. BP Readings from Last 3 Encounters:  11/01/22 124/70  10/11/22 122/80  10/05/22 124/76   Wt Readings from Last 3 Encounters:  11/01/22 297 lb 3.2 oz (134.8 kg)  10/11/22 282 lb 9.6 oz (128.2 kg)  10/05/22 294 lb (133.4 kg)   There is no height or weight on file to calculate BMI.    Physical Exam         Assessment & Plan:    See Problem List for Assessment and Plan of chronic medical problems.

## 2022-11-04 NOTE — ED Provider Notes (Signed)
[facility]    CSN: [id number] Arrival date & time: 11/04/22  1527      History   Chief Complaint Chief Complaint  Patient presents with   appt    Covid Positive    HPI Jill Harper is a 57 y.o. female.   Patient reports testing COVID-19 positive from a home test yesterday.  Friday she started having bodyaches, lower back pain and productive cough with fatigue and chills, nasal congestion and fatigue.  She contacted her primary care provider who suggested she be seen and to receive the antiviral.   She is on 3 L of oxygen via nasal cannula during the daytime at her baseline, has been using this consistently since Friday..  She has been taking over-the-counter Tylenol, Mucinex and Voltaren gel as needed.  Denies worsening shortness of breath from baseline or chest pain.  Past medical history significant for interstitial lung disease, pulmonary fibrosis, rheumatoid arthritis, sleep apnea and obesity.  The history is provided by the patient.    Past Medical History:  Diagnosis Date   Abnormal liver function test    Bronchiectasis        Dyspnea    Fibromuscular dysplasia (HCC)    Fibromyalgia    Heart murmur    ILD (interstitial lung disease) ([place])    Joint pain    Menorrhagia    MIGRAINE HEADACHE    MVP (mitral valve prolapse)    Osteoarthritis    Polymyositis (HCC)    Dr [name]; chronic MTX   Pulmonary fibrosis ([place])    Rheumatoid arthritis ([place])    Sleep apnea    Vertigo    Vitamin B 12 deficiency    Vitamin D deficiency     Patient Active Problem List   Diagnosis Date Noted   Left arm pain 09/13/2022   Left arm swelling 09/13/2022   Right upper quadrant pain 08/15/2022   Palpitations-related to MVP 04/02/2022   Nocturnal hypoxia 01/16/2022   Fibromuscular dysplasia of wall of intracranial artery ([place]) 12/20/2021   Anxiety 09/06/2021   Tingling 06/28/2021   Occipital neuralgia of left side 123XX123   Metabolic syndrome X  123XX123   Entrapment of left ulnar nerve 07/27/2019   Right hand pain 07/27/2019   Bilateral carpal tunnel syndrome 07/21/2019   Costochondritis 03/16/2019   Rib pain on right side 03/16/2019   Prediabetes 11/27/2018   Fibromuscular dysplasia ([place]) 10/17/2018   Hypertension 10/17/2018   Bronchiectasis with (acute) exacerbation ([place]) 10/08/2018   OSA (obstructive sleep apnea) 08/20/2018   Rheumatoid arthritis ([place]) - Dr Estanislado Pandy 04/17/2018   ILD (interstitial lung disease) ([place]) 04/17/2018   Dyspnea 04/01/2018   Bronchiectasis ([place]) 09/02/2017   LVH (left ventricular hypertrophy), mild 06/02/2017   Mitral regurgitation 05/22/2017   Jo-1 antibody positive 01/29/2017   Primary osteoarthritis of both feet 01/29/2017   Primary osteoarthritis of both hands 01/29/2017   Left shoulder tendonitis 01/17/2017   Vitamin D deficiency 04/26/2016   Cough 07/18/2015   URI (upper respiratory infection) 07/12/2015   MVP (mitral valve prolapse)    Erythema nodosum    INCONTINENCE, URGE 05/26/2010   Migraine headache 02/15/2010   Fibromyalgia 06/20/2009   Vertigo 03/05/2008   Polymyositis ([place]) 02/11/2008   BRONCHIECTASIS 02/10/2008    Past Surgical History:  Procedure Laterality Date   ablation uterine  2010   CARDIAC CATHETERIZATION  2002   normal   DILATION AND CURETTAGE OF UTERUS  08-31-08   Dr [name]   IR ANGIO  INTRA EXTRACRAN SEL COM CAROTID INNOMINATE BILAT MOD SED  09/18/2018   IR ANGIO VERTEBRAL SEL VERTEBRAL BILAT MOD SED  09/18/2018   IR US GUIDE VASC ACCESS RIGHT  09/18/2018    OB History     Gravida  1   Para      Term      Preterm      AB      Living  1      SAB      IAB      Ectopic      Multiple      Live Births               Home Medications    Prior to Admission medications   Medication Sig Start Date End Date Taking? Authorizing Provider  benzonatate (TESSALON) 100 MG capsule Take 1 capsule (100 mg total) by mouth every 8  (eight) hours. 11/04/22  Yes Louretta Shorten, [place], FNP  nirmatrelvir & ritonavir (PAXLOVID, 300/100,) 20 x 150 MG & 10 x 100MG  TBPK Take 3 tablets by mouth 2 (two) times daily for 5 days. 11/04/22 11/09/22 Yes Louretta Shorten, [place], FNP  albuterol (PROVENTIL HFA) 108 (90 Base) MCG/ACT inhaler Inhale 2 puffs into the lungs 4 (four) times daily as needed. Reported on 09/03/2015 01/15/22   Clayton Bibles, NP  albuterol (PROVENTIL) (2.5 MG/3ML) 0.083% nebulizer solution Take 3 mLs (2.5 mg total) by nebulization every 6 (six) hours as needed for wheezing or shortness of breath. 05/16/18   Chesley Mires, MD  aspirin EC 81 MG tablet Take 81 mg by mouth daily.    [provider]  atenolol (TENORMIN) 25 MG tablet Take 1 tablet (25 mg total) by mouth daily. 05/15/22   Binnie Rail, MD  azaTHIOprine (IMURAN) 50 MG tablet TAKE 2 TABLETS BY MOUTH DAILY 08/16/22   Bo Merino, MD  Cholecalciferol (VITAMIN D) 50 MCG (2000 UT) CAPS Take by mouth.    [provider]  diclofenac Sodium (VOLTAREN) 1 % GEL APPLY 2 TO 4 GRAMS TOPICALLY TO AFFECTED JOINT FOUR TIMES DAILY AS NEEDED 04/23/22   Ofilia Neas, PA-C  DULoxetine (CYMBALTA) 30 MG capsule TAKE ONE CAPSULE BY MOUTH DAILY FOR TOTAL OF 90 MG DAILY 07/23/22   Burns, Claudina Lick, MD  DULoxetine (CYMBALTA) 60 MG capsule TAKE 1 CAPSULE(60 MG) BY MOUTH DAILY 05/28/22   Binnie Rail, MD  ENBREL MINI 50 MG/ML SOCT INSERT 1 MINI CARTRIDGE INTO AUTOINJECTOR AND INJECT 50 MG UNDER THE SKIN EVERY 7 DAYS 09/04/22   Ofilia Neas, PA-C  gabapentin (NEURONTIN) 100 MG capsule Take 3 capsules (300 mg total) by mouth at bedtime. 05/01/22   Ward Givens, NP  Galcanezumab-gnlm (EMGALITY) 120 MG/ML SOAJ ADMINISTER 1 ML UNDER THE SKIN EVERY 30 DAYS 05/01/22   Ward Givens, NP  guaiFENesin (MUCINEX) 600 MG 12 hr tablet Take 2 tablets (1,200 mg total) by mouth 2 (two) times daily as needed for cough or to loosen phlegm. Patient taking differently: Take 1,200 mg by mouth  2 (two) times daily. 05/16/18   Chesley Mires, MD  meclizine (ANTIVERT) 25 MG tablet Take 1 tablet (25 mg total) by mouth 3 (three) times daily as needed for dizziness. 06/27/22   Binnie Rail, MD  metFORMIN (GLUCOPHAGE) 500 MG tablet TAKE 1 TABLET(500 MG) BY MOUTH TWICE DAILY WITH A MEAL 09/11/22   Burns, Claudina Lick, MD  methocarbamol (ROBAXIN) 500 MG tablet Take by mouth as needed.    [provider]  Multiple Vitamins-Minerals (MULTIVITAMIN GUMMIES ADULT PO) Take 2 each by mouth daily.    [provider]  Rimegepant Sulfate (NURTEC) 75 MG TBDP DISSOLVE ONE TABLET BY MOUTH DAILY AS NEEDED FOR MIGRAINES. TAKE AS CLOSE TO ONSET OF MIGRAINE AS POSSIBLE. 1 TABLET DAILY MAXIMUM. 05/01/22   Ward Givens, NP  sodium chloride HYPERTONIC 3 % nebulizer solution Take by nebulization as needed for other. 01/29/22   Cobb, Karie Schwalbe, NP  telmisartan (MICARDIS) 20 MG tablet TAKE 1 TABLET(20 MG) BY MOUTH DAILY 09/13/22   Binnie Rail, MD  TRELEGY ELLIPTA 100-62.5-25 MCG/ACT AEPB INHALE 1 PUFF INTO THE LUNGS DAILY 08/20/22   Cobb, Karie Schwalbe, NP    Family History Family History  Problem Relation Age of Onset   Diabetes Mother    Fibromyalgia Mother    Ulcers Mother    Heart failure Mother    Thyroid disease Mother    Obesity Mother    Multiple myeloma Father    Hypertension Father    Stroke Father    Lupus Sister    Other Sister        abdominal adhesions resulting in bowel obstruction   Migraines Sister    Heart disease Brother        bypass surgery   Rheum arthritis Sister    Multiple myeloma Sister    Hypertension Sister    Heart attack Sister    Diabetes Sister    Hypertension Sister    Rheum arthritis Sister    Diabetes Sister    Diabetes Brother    Headache Other        siblings with headaches but not diagnosed as migraines    Social History Social History   Tobacco Use   Smoking status: Never    Passive exposure: Never   Smokeless tobacco: Never   Tobacco  comments:    Married, lives with spouse. 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  HENT:  Negative for ear pain and sore throat.   Eyes:  Negative for pain and visual disturbance.  Respiratory:  Positive for cough and shortness of breath.   Cardiovascular:  Negative for chest pain and palpitations.  Gastrointestinal:  Negative for abdominal pain and vomiting.  Genitourinary:  Negative for dysuria and hematuria.  Musculoskeletal:  Negative for arthralgias and back pain.  Skin:  Negative for color change and rash.  Neurological:  Negative for seizures and syncope.  All other systems reviewed and are negative.    Physical Exam Triage Vital Signs ED Triage Vitals [11/04/22 1602]  Enc Vitals Group     BP 124/82     Pulse Rate 81     Resp 18     Temp 98.2 F (36.8 C)     Temp Source Oral     SpO2 98 %     Weight      Height      Head Circumference      Peak Flow      Pain Score      Pain Loc      Pain Edu?  Excl. in [place]?    No data found.  Updated Vital Signs BP 124/82 (BP Location: Left Arm)   Pulse 81   Temp 98.2 F (36.8 C) (Oral)   Resp 18   SpO2 98%   Visual Acuity Right Eye Distance:   Left Eye Distance:   Bilateral Distance:    Right Eye Near:   Left Eye Near:    Bilateral Near:     Physical Exam Vitals and nursing note reviewed.  Constitutional:      General: She is not in acute distress.    Appearance: She is well-developed.  HENT:     Head: Normocephalic and atraumatic.     Right Ear: External ear normal.     Left Ear: External ear normal.     Nose: Nose normal.     Mouth/Throat:     Mouth: Mucous membranes are  moist.  Eyes:     General: No scleral icterus.       Right eye: No discharge.        Left eye: No discharge.     Conjunctiva/sclera: Conjunctivae normal.  Cardiovascular:     Rate and Rhythm: Normal rate and regular rhythm.     Heart sounds: Normal heart sounds. No murmur heard. Pulmonary:     Effort: Pulmonary effort is normal. No respiratory distress.     Breath sounds: Normal breath sounds.     Comments: Stable on 3 L O2 via [place] Musculoskeletal:        General: No swelling. Normal range of motion.     Cervical back: Normal range of motion and neck supple.  Lymphadenopathy:     Cervical: Cervical adenopathy present.  Skin:    General: Skin is warm and dry.     Capillary Refill: Capillary refill takes less than 2 seconds.  Neurological:     Mental Status: She is alert and oriented to person, place, and time.  Psychiatric:        Mood and Affect: Mood normal.      UC Treatments / Results  Labs (all labs ordered are listed, but only abnormal results are displayed) Labs Reviewed - No data to display  EKG   Radiology No results found.  Procedures Procedures (including critical care time)  Medications Ordered in UC Medications - No data to display  Initial Impression / Assessment and Plan / UC Course  I have reviewed the triage vital signs and the nursing notes.  Pertinent labs & imaging results that were available during my care of the patient were reviewed by me and considered in my medical decision making (see chart for details).  Vitals in triage reviewed, patient is hemodynamically stable.  98% on 3 L via nasal cannula, lungs vesicular posteriorly.  Tested positive for COVID-19 with at home test yesterday, due to multiple comorbidities, will start on the antiviral Paxlovid.  Medications without interactions to Paxlovid besides Nurtec, advised to avoid during Paxlovid therapy, advised to withhold RA medications and contact her provider tomorrow to see when she should  restart these, despite lack of interaction.  Discussed symptomatic relief and follow-up precautions.  Patient verbalized understanding, no questions at this time.      Final Clinical Impressions(s) / UC Diagnoses   Final diagnoses:  [id number]     Discharge Instructions      Please start taking the Paxlovid as advised via the packaging and start this as soon as possible.  You take the [facility] as needed for cough.  You can alternate between Tylenol and ibuprofen as needed for fever, aches and pains.  Please ensure you are drinking plenty of water, you can do warm saline gargles, sleep with a humidifier, and do tea with warm honey if you develop sore throat.   Please do not take your Rimegepant (Nurtec) or your RA medications with Paxlovid and discuss with your rheumatologist on Monday when you should restart your RA medications.  Please return to clinic or seek immediate care if you develop worsening of shortness of breath, high fever despite medication, chest pain, wheezing or any worsening of condition.      ED Prescriptions     Medication Sig Dispense Auth. Provider   nirmatrelvir & ritonavir (PAXLOVID, 300/100,) 20 x 150 MG & 10 x 100MG  TBPK Take 3 tablets by mouth 2 (two) times daily for 5 days. 30 tablet Louretta Shorten, [place], [place]   benzonatate (TESSALON) 100 MG capsule Take 1 capsule (100 mg total) by mouth every 8 (eight) hours. 21 capsule Arland Usery, [place], [place]      PDMP not reviewed this encounter.   Cereniti Curb, [place] N, [place] 11/04/22 [phone]

## 2022-11-05 ENCOUNTER — Encounter (HOSPITAL_BASED_OUTPATIENT_CLINIC_OR_DEPARTMENT_OTHER): Payer: Self-pay | Admitting: Pulmonary Disease

## 2022-11-05 ENCOUNTER — Ambulatory Visit: Payer: Federal, State, Local not specified - PPO | Admitting: Internal Medicine

## 2022-11-05 ENCOUNTER — Telehealth: Payer: Self-pay | Admitting: *Deleted

## 2022-11-05 NOTE — Telephone Encounter (Signed)
Please advise 

## 2022-11-05 NOTE — Telephone Encounter (Signed)
Patient contacted the office and left message stating she has tested positive for Covid. Patient states she was seem at urgent care and was prescribed Paxlovid. She was advised to contact our office regarding her medications.   Returned call to patient and advised her she will need to hold her Enbrel and Arava for 2 weeks after her symptoms have resolved. Patient expressed understanding.

## 2022-11-14 ENCOUNTER — Encounter: Payer: Self-pay | Admitting: Internal Medicine

## 2022-11-14 ENCOUNTER — Ambulatory Visit: Payer: Federal, State, Local not specified - PPO | Admitting: Internal Medicine

## 2022-11-14 VITALS — BP 128/80 | HR 75 | Temp 98.5°F | Ht 68.0 in | Wt 292.0 lb

## 2022-11-14 DIAGNOSIS — I341 Nonrheumatic mitral (valve) prolapse: Secondary | ICD-10-CM

## 2022-11-14 DIAGNOSIS — R7303 Prediabetes: Secondary | ICD-10-CM

## 2022-11-14 DIAGNOSIS — M797 Fibromyalgia: Secondary | ICD-10-CM

## 2022-11-14 DIAGNOSIS — I10 Essential (primary) hypertension: Secondary | ICD-10-CM

## 2022-11-14 DIAGNOSIS — F419 Anxiety disorder, unspecified: Secondary | ICD-10-CM

## 2022-11-14 NOTE — Assessment & Plan Note (Signed)
Chronic Lab Results  Component Value Date   HGBA1C 5.9 09/18/2022    Low sugar / carb diet Stressed regular exercise

## 2022-11-14 NOTE — Assessment & Plan Note (Signed)
Chronic Blood pressure well-controlled Continue current medications-atenolol 25 mg daily, telmisartan 20 mg daily 

## 2022-11-14 NOTE — Assessment & Plan Note (Signed)
Chronic Has chronic pain, associated brain fog-symptoms varied in intensity Continue Cymbalta 90 mg daily, gabapentin 300 mg at bedtime Will work on increasing activity 

## 2022-11-14 NOTE — Patient Instructions (Addendum)
         Medications changes include :   none     Return for 2-3 month follow up.

## 2022-11-14 NOTE — Assessment & Plan Note (Signed)
Chronic Occasional palpitations or pinching like pain Continue atenolol 25 mg daily-can take an extra dose if needed 

## 2022-11-14 NOTE — Progress Notes (Signed)
Subjective:    Patient ID: Jill Harper, female    DOB: 07/13/1966, 57 y.o.   MRN: [id number]     HPI Jill Harper is here for follow up of her chronic medical problems.  Had covid for first time the end of last month - took paxlovid.  She still has fatigue.  Still has cough - both cough and fatigue are improving but very slow.  Using her lifevest.  Sleeping well.  Taking vitamin d, vitamin c, b complex.   Eating one good meal a day.    More good days than bad with the anxiety.    Medications and allergies reviewed with patient and updated if appropriate.  Current Outpatient Medications on File Prior to Visit  Medication Sig Dispense Refill   albuterol (PROVENTIL HFA) 108 (90 Base) MCG/ACT inhaler Inhale 2 puffs into the lungs 4 (four) times daily as needed. Reported on 09/03/2015 1 each 4   albuterol (PROVENTIL) (2.5 MG/3ML) 0.083% nebulizer solution Take 3 mLs (2.5 mg total) by nebulization every 6 (six) hours as needed for wheezing or shortness of breath. 120 vial 5   aspirin EC 81 MG tablet Take 81 mg by mouth daily.     atenolol (TENORMIN) 25 MG tablet Take 1 tablet (25 mg total) by mouth daily. 90 tablet 2   azaTHIOprine (IMURAN) 50 MG tablet TAKE 2 TABLETS BY MOUTH DAILY 60 tablet 2   Cholecalciferol (VITAMIN D) 50 MCG (2000 UT) CAPS Take by mouth.     diclofenac Sodium (VOLTAREN) 1 % GEL APPLY 2 TO 4 GRAMS TOPICALLY TO AFFECTED JOINT FOUR TIMES DAILY AS NEEDED 400 g 2   DULoxetine (CYMBALTA) 30 MG capsule TAKE ONE CAPSULE BY MOUTH DAILY FOR TOTAL OF 90 MG DAILY 30 capsule 3   DULoxetine (CYMBALTA) 60 MG capsule TAKE 1 CAPSULE(60 MG) BY MOUTH DAILY 90 capsule 1   ENBREL MINI 50 MG/ML SOCT INSERT 1 MINI CARTRIDGE INTO AUTOINJECTOR AND INJECT 50 MG UNDER THE SKIN EVERY 7 DAYS 12 mL 0   gabapentin (NEURONTIN) 100 MG capsule Take 3 capsules (300 mg total) by mouth at bedtime. 270 capsule 3   Galcanezumab-gnlm (EMGALITY) 120 MG/ML SOAJ ADMINISTER 1 ML UNDER THE SKIN EVERY 30 DAYS 3 mL  3   guaiFENesin (MUCINEX) 600 MG 12 hr tablet Take 2 tablets (1,200 mg total) by mouth 2 (two) times daily as needed for cough or to loosen phlegm. (Patient taking differently: Take 1,200 mg by mouth 2 (two) times daily.)     meclizine (ANTIVERT) 25 MG tablet Take 1 tablet (25 mg total) by mouth 3 (three) times daily as needed for dizziness. 30 tablet 0   metFORMIN (GLUCOPHAGE) 500 MG tablet TAKE 1 TABLET(500 MG) BY MOUTH TWICE DAILY WITH A MEAL 180 tablet 1   methocarbamol (ROBAXIN) 500 MG tablet Take by mouth as needed.     Multiple Vitamins-Minerals (MULTIVITAMIN GUMMIES ADULT PO) Take 2 each by mouth daily.     Rimegepant Sulfate (NURTEC) 75 MG TBDP DISSOLVE ONE TABLET BY MOUTH DAILY AS NEEDED FOR MIGRAINES. TAKE AS CLOSE TO ONSET OF MIGRAINE AS POSSIBLE. 1 TABLET DAILY MAXIMUM. 15 tablet 11   sodium chloride HYPERTONIC 3 % nebulizer solution Take by nebulization as needed for other. 750 mL 12   telmisartan (MICARDIS) 20 MG tablet TAKE 1 TABLET(20 MG) BY MOUTH DAILY 90 tablet 1   TRELEGY ELLIPTA 100-62.5-25 MCG/ACT AEPB INHALE 1 PUFF INTO THE LUNGS DAILY 60 each 5   No  current facility-administered medications on file prior to visit.     Review of Systems  Constitutional:  Positive for fatigue.  Respiratory:  Positive for cough, shortness of breath and wheezing.   Cardiovascular:  Positive for chest pain (one day with covid). Negative for palpitations and leg swelling.  Neurological:  Positive for dizziness. Negative for headaches.       Objective:   Vitals:   11/14/22 1508  BP: 128/80  Pulse: 75  Temp: 98.5 F (36.9 C)  SpO2: 97%   BP Readings from Last 3 Encounters:  11/14/22 128/80  11/04/22 124/82  11/01/22 124/70   Wt Readings from Last 3 Encounters:  11/14/22 292 lb (132.5 kg)  11/01/22 297 lb 3.2 oz (134.8 kg)  10/11/22 282 lb 9.6 oz (128.2 kg)   Body mass index is 44.4 kg/m.    Physical Exam Constitutional:      General: She is not in acute distress.     Appearance: Normal appearance.  HENT:     Head: Normocephalic and atraumatic.  Eyes:     Conjunctiva/sclera: Conjunctivae normal.  Cardiovascular:     Rate and Rhythm: Normal rate and regular rhythm.     Heart sounds: Normal heart sounds.  Pulmonary:     Effort: Pulmonary effort is normal. No respiratory distress.     Breath sounds: Normal breath sounds. No wheezing.  Musculoskeletal:     Cervical back: Neck supple.     Right lower leg: No edema.     Left lower leg: No edema.  Lymphadenopathy:     Cervical: No cervical adenopathy.  Skin:    General: Skin is warm and dry.     Findings: No rash.  Neurological:     Mental Status: She is alert. Mental status is at baseline.  Psychiatric:        Mood and Affect: Mood normal.        Behavior: Behavior normal.        Lab Results  Component Value Date   WBC 8.5 08/21/2022   HGB 13.0 08/21/2022   HCT 39.2 08/21/2022   PLT 287 08/21/2022   GLUCOSE 95 09/18/2022   CHOL 123 09/18/2022   TRIG 88.0 09/18/2022   HDL 38.90 (L) 09/18/2022   LDLCALC 67 09/18/2022   ALT 14 09/18/2022   AST 18 09/18/2022   NA 137 09/18/2022   K 4.4 09/18/2022   CL 103 09/18/2022   CREATININE 0.71 09/18/2022   BUN 7 09/18/2022   CO2 29 09/18/2022   TSH 4.32 09/18/2022   INR 1.03 09/18/2018   HGBA1C 5.9 09/18/2022     Assessment & Plan:    See Problem List for Assessment and Plan of chronic medical problems.

## 2022-11-14 NOTE — Assessment & Plan Note (Signed)
Chronic Fairly controlled Has more good days than bad days - can use her techniques to help with the anxiety Continue duloxetine 90 mg daily Speaking with a therapist once a week

## 2022-11-18 ENCOUNTER — Other Ambulatory Visit: Payer: Self-pay | Admitting: Internal Medicine

## 2022-11-19 ENCOUNTER — Encounter: Payer: Self-pay | Admitting: *Deleted

## 2022-11-19 ENCOUNTER — Other Ambulatory Visit: Payer: Self-pay

## 2022-11-19 ENCOUNTER — Other Ambulatory Visit: Payer: Self-pay | Admitting: Rheumatology

## 2022-11-19 NOTE — Telephone Encounter (Signed)
Last Fill: 08/16/2022  Labs: 08/21/2022 CBC and CMP normal   Next Visit: 12/18/2022   Last Visit: 09/19/2022  DX: Polymyositis   Current Dose per office note 09/19/2022: Imuran 100 mg by mouth daily   Message sent to patient via my chart to advise patient she is due to update labs.   Okay to refill Imuran?

## 2022-11-19 NOTE — Telephone Encounter (Signed)
Received another Emgality PA.   

## 2022-11-20 ENCOUNTER — Other Ambulatory Visit: Payer: Self-pay | Admitting: *Deleted

## 2022-11-20 ENCOUNTER — Encounter: Payer: Self-pay | Admitting: Internal Medicine

## 2022-11-20 DIAGNOSIS — Z79899 Other long term (current) drug therapy: Secondary | ICD-10-CM

## 2022-11-20 DIAGNOSIS — M3320 Polymyositis, organ involvement unspecified: Secondary | ICD-10-CM

## 2022-11-20 NOTE — Progress Notes (Unsigned)
Subjective:    Patient ID: Jill Harper, female    DOB: Jan 14, 1966, 57 y.o.   MRN: [id number]      HPI Chavy is here for No chief complaint on file.   Back pain -      Medications and allergies reviewed with patient and updated if appropriate.  Current Outpatient Medications on File Prior to Visit  Medication Sig Dispense Refill   DULoxetine (CYMBALTA) 30 MG capsule TAKE ONE CAPSULE BY MOUTH DAILY FOR TOTAL OF 90 MG DAILY 30 capsule 3   albuterol (PROVENTIL HFA) 108 (90 Base) MCG/ACT inhaler Inhale 2 puffs into the lungs 4 (four) times daily as needed. Reported on 09/03/2015 1 each 4   albuterol (PROVENTIL) (2.5 MG/3ML) 0.083% nebulizer solution Take 3 mLs (2.5 mg total) by nebulization every 6 (six) hours as needed for wheezing or shortness of breath. 120 vial 5   aspirin EC 81 MG tablet Take 81 mg by mouth daily.     atenolol (TENORMIN) 25 MG tablet Take 1 tablet (25 mg total) by mouth daily. 90 tablet 2   azaTHIOprine (IMURAN) 50 MG tablet TAKE 2 TABLETS BY MOUTH DAILY 60 tablet 0   Cholecalciferol (VITAMIN D) 50 MCG (2000 UT) CAPS Take by mouth.     diclofenac Sodium (VOLTAREN) 1 % GEL APPLY 2 TO 4 GRAMS TOPICALLY TO AFFECTED JOINT FOUR TIMES DAILY AS NEEDED 400 g 2   DULoxetine (CYMBALTA) 60 MG capsule TAKE 1 CAPSULE(60 MG) BY MOUTH DAILY 90 capsule 1   ENBREL MINI 50 MG/ML SOCT INSERT 1 MINI CARTRIDGE INTO AUTOINJECTOR AND INJECT 50 MG UNDER THE SKIN EVERY 7 DAYS 12 mL 0   gabapentin (NEURONTIN) 100 MG capsule Take 3 capsules (300 mg total) by mouth at bedtime. 270 capsule 3   Galcanezumab-gnlm (EMGALITY) 120 MG/ML SOAJ ADMINISTER 1 ML UNDER THE SKIN EVERY 30 DAYS 3 mL 3   guaiFENesin (MUCINEX) 600 MG 12 hr tablet Take 2 tablets (1,200 mg total) by mouth 2 (two) times daily as needed for cough or to loosen phlegm. (Patient taking differently: Take 1,200 mg by mouth 2 (two) times daily.)     meclizine (ANTIVERT) 25 MG tablet Take 1 tablet (25 mg total) by mouth 3 (three) times  daily as needed for dizziness. 30 tablet 0   metFORMIN (GLUCOPHAGE) 500 MG tablet TAKE 1 TABLET(500 MG) BY MOUTH TWICE DAILY WITH A MEAL 180 tablet 1   methocarbamol (ROBAXIN) 500 MG tablet Take by mouth as needed.     Multiple Vitamins-Minerals (MULTIVITAMIN GUMMIES ADULT PO) Take 2 each by mouth daily.     Rimegepant Sulfate (NURTEC) 75 MG TBDP DISSOLVE ONE TABLET BY MOUTH DAILY AS NEEDED FOR MIGRAINES. TAKE AS CLOSE TO ONSET OF MIGRAINE AS POSSIBLE. 1 TABLET DAILY MAXIMUM. 15 tablet 11   sodium chloride HYPERTONIC 3 % nebulizer solution Take by nebulization as needed for other. 750 mL 12   telmisartan (MICARDIS) 20 MG tablet TAKE 1 TABLET(20 MG) BY MOUTH DAILY 90 tablet 1   TRELEGY ELLIPTA 100-62.5-25 MCG/ACT AEPB INHALE 1 PUFF INTO THE LUNGS DAILY 60 each 5   No current facility-administered medications on file prior to visit.    Review of Systems     Objective:  There were no vitals filed for this visit. BP Readings from Last 3 Encounters:  11/14/22 128/80  11/04/22 124/82  11/01/22 124/70   Wt Readings from Last 3 Encounters:  11/14/22 292 lb (132.5 kg)  11/01/22 297 lb 3.2 oz (  134.8 kg)  10/11/22 282 lb 9.6 oz (128.2 kg)   There is no height or weight on file to calculate BMI.    Physical Exam         Assessment & Plan:    See Problem List for Assessment and Plan of chronic medical problems.

## 2022-11-20 NOTE — Patient Instructions (Addendum)
      Blood work was ordered.   The lab is on the first floor.    Medications changes include :       A referral was ordered for XXX.     Someone will call you to schedule an appointment.    Return if symptoms worsen or fail to improve.

## 2022-11-21 ENCOUNTER — Ambulatory Visit: Payer: Federal, State, Local not specified - PPO | Admitting: Internal Medicine

## 2022-11-21 VITALS — BP 130/72 | HR 78 | Temp 98.2°F | Ht 68.0 in | Wt 294.0 lb

## 2022-11-21 DIAGNOSIS — M549 Dorsalgia, unspecified: Secondary | ICD-10-CM

## 2022-11-21 DIAGNOSIS — F410 Panic disorder [episodic paroxysmal anxiety] without agoraphobia: Secondary | ICD-10-CM | POA: Diagnosis not present

## 2022-11-21 LAB — CBC WITH DIFFERENTIAL/PLATELET
Absolute Monocytes: 853 cells/uL (ref 200–950)
Basophils Absolute: 20 cells/uL (ref 0–200)
Basophils Relative: 0.2 %
Eosinophils Absolute: 167 cells/uL (ref 15–500)
Eosinophils Relative: 1.7 %
HCT: 39.8 % (ref 35.0–45.0)
Hemoglobin: 13 g/dL (ref 11.7–15.5)
Lymphs Abs: 1882 cells/uL (ref 850–3900)
MCH: 27 pg (ref 27.0–33.0)
MCHC: 32.7 g/dL (ref 32.0–36.0)
MCV: 82.7 fL (ref 80.0–100.0)
MPV: 11 fL (ref 7.5–12.5)
Monocytes Relative: 8.7 %
Neutro Abs: 6880 cells/uL (ref 1500–7800)
Neutrophils Relative %: 70.2 %
Platelets: 284 10*3/uL (ref 140–400)
RBC: 4.81 10*6/uL (ref 3.80–5.10)
RDW: 12.3 % (ref 11.0–15.0)
Total Lymphocyte: 19.2 %
WBC: 9.8 10*3/uL (ref 3.8–10.8)

## 2022-11-21 LAB — COMPLETE METABOLIC PANEL WITH GFR
AG Ratio: 1.1 (calc) (ref 1.0–2.5)
ALT: 18 U/L (ref 6–29)
AST: 17 U/L (ref 10–35)
Albumin: 4.1 g/dL (ref 3.6–5.1)
Alkaline phosphatase (APISO): 68 U/L (ref 37–153)
BUN: 13 mg/dL (ref 7–25)
CO2: 25 mmol/L (ref 20–32)
Calcium: 9.4 mg/dL (ref 8.6–10.4)
Chloride: 107 mmol/L (ref 98–110)
Creat: 0.67 mg/dL (ref 0.50–1.03)
Globulin: 3.6 g/dL (calc) (ref 1.9–3.7)
Glucose, Bld: 97 mg/dL (ref 65–139)
Potassium: 4.3 mmol/L (ref 3.5–5.3)
Sodium: 139 mmol/L (ref 135–146)
Total Bilirubin: 0.3 mg/dL (ref 0.2–1.2)
Total Protein: 7.7 g/dL (ref 6.1–8.1)
eGFR: 102 mL/min/{1.73_m2} (ref >=60)

## 2022-11-21 LAB — CK: Total CK: 48 U/L (ref 29–143)

## 2022-11-21 MED ORDER — METHOCARBAMOL 500 MG PO TABS: 500.0000 mg | ORAL_TABLET | Freq: Three times a day (TID) | ORAL | 3 refills | Status: DC | PRN

## 2022-11-21 NOTE — Assessment & Plan Note (Signed)
Acute on chronic back pain Has chronic pain across her lower back that has been going on for a while and typically flares with certain activities and improves with rest-this is unchanged Since yesterday morning she has had pain in her right lower back to her right middle back-a deep ache/pressure pain The pain is not worse with movement and there are no related GI/GU symptoms Area is tender on exam, but she does have some generalized muscle tenderness which may be from the fibromyalgia Pain is slightly better today There are no alarming symptoms or findings on exam Will try conservative treatment for couple of days and hopefully will improve and resolve-she will let me know if it is not improving Take Tylenol, use heat Methocarbamol 500 mg 3 times daily as needed-she has taken this in the past and tolerated it well Call or return if no improvement

## 2022-11-21 NOTE — Progress Notes (Signed)
CK WNL

## 2022-11-21 NOTE — Progress Notes (Signed)
CBC and CMP are normal.

## 2022-11-27 ENCOUNTER — Telehealth: Payer: Self-pay | Admitting: *Deleted

## 2022-11-27 ENCOUNTER — Other Ambulatory Visit: Payer: Self-pay | Admitting: Physician Assistant

## 2022-11-27 DIAGNOSIS — J471 Bronchiectasis with (acute) exacerbation: Secondary | ICD-10-CM | POA: Diagnosis not present

## 2022-11-27 DIAGNOSIS — G4733 Obstructive sleep apnea (adult) (pediatric): Secondary | ICD-10-CM | POA: Diagnosis not present

## 2022-11-27 DIAGNOSIS — M0579 Rheumatoid arthritis with rheumatoid factor of multiple sites without organ or systems involvement: Secondary | ICD-10-CM

## 2022-11-27 NOTE — Telephone Encounter (Signed)
Last Fill: 09/04/2022  Labs: 11/20/2022 CBC and CMP are normal.   TB Gold: 02/14/2022   Next Visit: 12/18/2022  Last Visit: 09/19/2022  JY:[id number]   Current Dose per office note 09/19/2022: Enbrel 50 mg sq injections once weekly   Okay to refill Enbrel?

## 2022-11-27 NOTE — Telephone Encounter (Signed)
Received fax from [facility] that PA needed for Emgality . Plan phone# [phone]. Pt ID: [id number]

## 2022-11-28 DIAGNOSIS — F410 Panic disorder [episodic paroxysmal anxiety] without agoraphobia: Secondary | ICD-10-CM | POA: Diagnosis not present

## 2022-11-30 ENCOUNTER — Other Ambulatory Visit: Payer: Self-pay | Admitting: Internal Medicine

## 2022-11-30 ENCOUNTER — Other Ambulatory Visit (HOSPITAL_COMMUNITY): Payer: Self-pay

## 2022-11-30 NOTE — Telephone Encounter (Signed)
Pharmacy Patient Advocate Encounter   Received notification from [facility] that prior authorization for Emgality 120MG /ML auto-injectors (migraine) is required/requested.    PA submitted on 11/30/2022 to (ins) [employer] via [employer] or [facility]) confirmation # [id number] Status is pending

## 2022-12-03 ENCOUNTER — Other Ambulatory Visit (HOSPITAL_COMMUNITY): Payer: Self-pay

## 2022-12-03 NOTE — Telephone Encounter (Signed)
Pharmacy Patient Advocate Encounter  Prior Authorization for Emgality 120MG /ML auto-injectors (migraine)  has been approved by [employer] (ins).    PA # N/A- will be in the communication letter Effective dates: 12/03/2022 through 12/03/2023

## 2022-12-04 NOTE — Telephone Encounter (Signed)
Phone room: please call pt and let her know Emgality approved. She should now be able to pick this up from her pharmacy.

## 2022-12-04 NOTE — Telephone Encounter (Signed)
Called pt. Informed her of message nurse [facility] sent. Pt said thank you so much.

## 2022-12-05 DIAGNOSIS — F410 Panic disorder [episodic paroxysmal anxiety] without agoraphobia: Secondary | ICD-10-CM | POA: Diagnosis not present

## 2022-12-05 NOTE — Progress Notes (Unsigned)
Office Visit Note  Patient: Jill Harper             Date of Birth: 1966/04/06           MRN: [id number]             PCP: Pincus Sanes, MD Referring: Pincus Sanes, MD Visit Date: 12/18/2022 Occupation: @GUAROCC @  Subjective:  Recent gap in therapy   History of Present Illness: Jill Harper is a 57 y.o. female with history of polymyositis, seropositive rheumatoid arthritis, and ILD.  Patient remains on Enbrel 50 mg sq injections once weekly (02/18/20-1st injection) and Imuran 100 mg by mouth daily. Patient states she was diagnosed with covid-19 on easter Sunday. She recently held both medications for about 3 weeks while recovering from covid-19 infection. She states she is having increased pain and swelling in both hands which she attributes to the recent gap in therapy.  She has intermittent pain and swelling in both knees and both ankles.  She is starting to notice gradual improvement in her energy level and arthralgias.   Patient states that she did have a fibromyalgia flare after the COVID-19 infection.  She was having increased trapezius muscle tension tenderness bilaterally as well as muscle spasms in her lower back.  She is been taking methocarbamol more frequently to alleviate her symptoms which has been helpful.  She remains on gabapentin and Cymbalta as prescribed.    Activities of Daily Living:  Patient reports morning stiffness for 5-7 minutes.   Patient Reports nocturnal pain.  Difficulty dressing/grooming: Denies Difficulty climbing stairs: Reports Difficulty getting out of chair: Reports Difficulty using hands for taps, buttons, cutlery, and/or writing: Reports  Review of Systems  Constitutional:  Positive for fatigue.  HENT:  Negative for mouth sores and mouth dryness.   Eyes:  Negative for dryness.  Respiratory:  Positive for cough, shortness of breath and wheezing.   Cardiovascular:  Negative for chest pain and palpitations.  Gastrointestinal:  Negative for blood  in stool, constipation and diarrhea.  Endocrine: Negative for increased urination.  Genitourinary:  Negative for involuntary urination.  Musculoskeletal:  Positive for joint pain, gait problem, joint pain, joint swelling, myalgias, muscle weakness, morning stiffness, muscle tenderness and myalgias.  Skin:  Positive for sensitivity to sunlight. Negative for color change, rash and hair loss.  Allergic/Immunologic: Positive for susceptible to infections.  Neurological:  Positive for dizziness and headaches.  Hematological:  Negative for swollen glands.  Psychiatric/Behavioral:  Positive for depressed mood and sleep disturbance. The patient is nervous/anxious.     PMFS History:  Patient Active Problem List   Diagnosis Date Noted   Back pain 11/21/2022   Left arm pain 09/13/2022   Left arm swelling 09/13/2022   Right upper quadrant pain 08/15/2022   Palpitations-related to MVP 04/02/2022   Nocturnal hypoxia 01/16/2022   Fibromuscular dysplasia of wall of intracranial artery (HCC) 12/20/2021   Anxiety 09/06/2021   Tingling 06/28/2021   Occipital neuralgia of left side 01/06/2020   Metabolic syndrome X 07/28/2019   Entrapment of left ulnar nerve 07/27/2019   Right hand pain 07/27/2019   Bilateral carpal tunnel syndrome 07/21/2019   Costochondritis 03/16/2019   Rib pain on right side 03/16/2019   Prediabetes 11/27/2018   Fibromuscular dysplasia (HCC) 10/17/2018   Hypertension 10/17/2018   Bronchiectasis with (acute) exacerbation (HCC) 10/08/2018   OSA (obstructive sleep apnea) 08/20/2018   Rheumatoid arthritis (HCC) - Dr Corliss Skains 04/17/2018   ILD (interstitial lung disease) (HCC) 04/17/2018  Dyspnea 04/01/2018   Bronchiectasis (HCC) 09/02/2017   LVH (left ventricular hypertrophy), mild 06/02/2017   Mitral regurgitation 05/22/2017   Jo-1 antibody positive 01/29/2017   Primary osteoarthritis of both feet 01/29/2017   Primary osteoarthritis of both hands 01/29/2017   Left shoulder  tendonitis 01/17/2017   Vitamin D deficiency 04/26/2016   Cough 07/18/2015   URI (upper respiratory infection) 07/12/2015   MVP (mitral valve prolapse)    Erythema nodosum    INCONTINENCE, URGE 05/26/2010   Migraine headache 02/15/2010   Fibromyalgia 06/20/2009   Vertigo 03/05/2008   Polymyositis (HCC) 02/11/2008   BRONCHIECTASIS 02/10/2008    Past Medical History:  Diagnosis Date   Abnormal liver function test    Bronchiectasis        Dyspnea    Fibromuscular dysplasia (HCC)    Fibromyalgia    Heart murmur    ILD (interstitial lung disease) (HCC)    Joint pain    Menorrhagia    MIGRAINE HEADACHE    MVP (mitral valve prolapse)    Osteoarthritis    Polymyositis (HCC)    Dr [name]; chronic MTX   Pulmonary fibrosis (HCC)    Rheumatoid arthritis (HCC)    Sleep apnea    Vertigo    Vitamin B 12 deficiency    Vitamin D deficiency     Family History  Problem Relation Age of Onset   Diabetes Mother    Fibromyalgia Mother    Ulcers Mother    Heart failure Mother    Thyroid disease Mother    Obesity Mother    Multiple myeloma Father    Hypertension Father    Stroke Father    Lupus Sister    Other Sister        abdominal adhesions resulting in bowel obstruction   Migraines Sister    Heart disease Brother        bypass surgery   Rheum arthritis Sister    Multiple myeloma Sister    Hypertension Sister    Heart attack Sister    Diabetes Sister    Hypertension Sister    Rheum arthritis Sister    Diabetes Sister    Diabetes Brother    Headache Other        siblings with headaches but not diagnosed as migraines   Past Surgical History:  Procedure Laterality Date   ablation uterine  2010   CARDIAC CATHETERIZATION  2002   normal   DILATION AND CURETTAGE OF UTERUS  08-31-08   Dr Zelphia Cairo   IR ANGIO INTRA EXTRACRAN SEL COM CAROTID INNOMINATE BILAT MOD SED  09/18/2018   IR ANGIO VERTEBRAL SEL VERTEBRAL BILAT MOD SED  09/18/2018   IR US GUIDE VASC  ACCESS RIGHT  09/18/2018   Social History   Social History Narrative   Exercise: trying to walk - limited by fatigue   Lives at home with husband    Right handed   Caffeine: none    Immunization History  Administered Date(s) Administered   Influenza Split 04/10/2011, 06/18/2012, 05/06/2013   Influenza Whole 05/19/2008, 05/15/2009, 06/01/2010   Influenza,inj,Quad PF,6+ Mos 04/20/2014, 05/26/2015, 05/31/2016, 04/05/2017, 06/16/2018, 04/22/2019, 04/22/2020   Influenza-Unspecified 04/11/2021, 04/02/2022   PFIZER(Purple Top)SARS-COV-2 Vaccination 10/27/2019, 11/19/2019   Pneumococcal Conjugate-13 01/29/2017   Pneumococcal Polysaccharide-23 08/07/2007, 06/16/2018   Td 07/12/2009   Tdap 01/06/2020   Unspecified SARS-COV-2 Vaccination 10/27/2019, 11/19/2019, 04/04/2021     Objective: Vital Signs: BP 117/76 (BP Location: Left Arm, Patient Position: Sitting, Cuff Size: Large)  Pulse 72   Resp 18   Ht 5\' 8"  (1.727 m)   Wt (!) 301 lb 3.2 oz (136.6 kg)   BMI 45.80 kg/m    Physical Exam Vitals and nursing note reviewed.  Constitutional:      Appearance: She is well-developed.  HENT:     Head: Normocephalic and atraumatic.  Eyes:     Conjunctiva/sclera: Conjunctivae normal.  Cardiovascular:     Rate and Rhythm: Normal rate and regular rhythm.     Heart sounds: Normal heart sounds.  Pulmonary:     Effort: Pulmonary effort is normal.     Breath sounds: Normal breath sounds.  Abdominal:     General: Bowel sounds are normal.     Palpations: Abdomen is soft.  Musculoskeletal:     Cervical back: Normal range of motion.  Lymphadenopathy:     Cervical: No cervical adenopathy.  Skin:    General: Skin is warm and dry.     Capillary Refill: Capillary refill takes less than 2 seconds.  Neurological:     Mental Status: She is alert and oriented to person, place, and time.  Psychiatric:        Behavior: Behavior normal.      Musculoskeletal Exam: C-spine has limited range of motion  without rotation.  Trapezius muscle tension tenderness bilaterally.  Painful range of motion of the lumbar spine.  Shoulder joints, elbow joints, wrist joints have good range of motion with no inflammation.  Incomplete left fist formation.  Tenderness of the PIP joints.  Hip joints have good range of motion with no groin pain.  Tenderness over bilateral trochanteric bursa.  Knee joints have good range of motion with no warmth or effusion.  Ankle joints have tenderness upon palpation bilaterally.  Inflammation noted in the left ankle.  CDAI Exam: CDAI Score: 0.9  Patient Global: 5 mm; Provider Global: 4 mm Swollen: 1 ; Tender: 1  Joint Exam 12/18/2022      Right  Left  Ankle     Swollen Tender     Investigation: No additional findings.  Imaging: No results found.  Recent Labs: Lab Results  Component Value Date   WBC 9.8 11/20/2022   HGB 13.0 11/20/2022   PLT 284 11/20/2022   NA 139 11/20/2022   K 4.3 11/20/2022   CL 107 11/20/2022   CO2 25 11/20/2022   GLUCOSE 97 11/20/2022   BUN 13 11/20/2022   CREATININE 0.67 11/20/2022   BILITOT 0.3 11/20/2022   ALKPHOS 73 09/18/2022   AST 17 11/20/2022   ALT 18 11/20/2022   PROT 7.7 11/20/2022   ALBUMIN 4.3 09/18/2022   CALCIUM 9.4 11/20/2022   GFRAA 114 01/18/2021   QFTBGOLDPLUS NEGATIVE 02/14/2022    Speciality Comments: Prior therapy includes: MTX (d/c due to ILD progresssion)  Procedures:  No procedures performed Allergies: Sulfonamide derivatives      Assessment / Plan:     Visit Diagnoses: Polymyositis (HCC) - Dx by Dr. Kellie Simmering 2007, CK 801, Jo-1 positive: She is not exhibiting any signs or symptoms of a polymyositis flare.  She has clinically been doing well on the current treatment regimen.  She has no muscular weakness at this time.  She has been experiencing some increased myofascial pain and muscle fatigue due to underlying fibromyalgia.  CK within normal limits--48 on 11/20/2022.  Standing orders for CK remain in  place. No medication changes will be made at this time.  She is advised to notify us if she develops signs  or symptoms of a myositis flare.  She will follow-up in the office in 3 months or sooner if needed.  High risk medication use - Enbrel 50 mg sq injections once weekly (02/18/20-1st injection) and Imuran 100 mg by mouth daily. d/c MTX due to progressioin of ILD.  CBC and CMP were within normal limits on 11/20/2022.  Her next lab work will be due in July.  Standing orders for CBC and CMP remain in place. TB Gold negative on 02/14/2022. Patient was diagnosed with COVID-19 on Easter Sunday.  She held both Enbrel and Imuran for 3 weeks until her symptoms resolved.   Discussed the importance of holding Enbrel and Imuran if she develops signs or symptoms of an infection and to resume once the infection has completely cleared. - Plan: QuantiFERON-TB Gold Plus  Screening for tuberculosis - Future order for TB gold placed today. Plan: QuantiFERON-TB Gold Plus  Rheumatoid arthritis involving multiple sites with positive rheumatoid factor (HCC) - - +RF, +CCP: Patient presents today experiencing increased pain in both hands, both knees, and both ankle joints.  She has been having increased tenderness and stiffness in the PIP joints and has a difficult time making a complete fist with the left hand.  Both knee joints have good range of motion with no warmth or effusion on exam.  She has tenderness over both ankles with some inflammation in the left ankle.  She recently had a gap in therapy for 3 weeks at which time she held Enbrel and Imuran while recovering from a COVID-19 infection.  She has resumed both medications since her symptoms have resolved.  Her arthralgias and joint inflammation have gradually started to improve.  Patient declined a prednisone taper at this time.  She will remain on Enbrel and Imuran as prescribed.  She was advised to notify us if her symptoms persist or worsen.  Jo-1 antibody  positive  ILD (interstitial lung disease) (HCC) - - Followed by Dr. Thamas Jaegers, NP at [facility] pulm.  Completed pulmonary rehab. Stable.  She remains on imuran and enbrel as prescribed.   Bronchiectasis without complication (HCC): Followed by Dr. Thamas Jaegers, NP: Patient is using smart vest twice daily which has been helpful.  Bilateral carpal tunnel syndrome: Currently asymptomatic.  Primary osteoarthritis of both hands: She has been experiencing increased pain and stiffness in her PIP joints.  She has difficulty making a complete fist with the left hand today.  Primary osteoarthritis of both feet: She has been experiencing increased pain in both ankle joints.  She has tenderness and inflammation in the left ankle noted on examination today.  Fibromyalgia: She has been experiencing a flare of fibromyalgia recently.  She has had increased trapezius muscle tension and tenderness bilaterally.  She has been experiencing increased muscle spasms in her lower back.  She remains on Cymbalta, gabapentin, and methocarbamol as prescribed.  She has had to take methocarbamol more frequently which has been helping to alleviate her symptoms.  Her energy level has gradually been improving.  Trapezius muscle spasm: She is having trapezius tension and tenderness bilaterally.  She is been experiencing muscle spasms intermittently.  She takes methocarbamol 500 mg 1 tablet every 8 hours as needed for muscle spasms which has been helpful.  Other fatigue: She has been experiencing increased fatigue which she attributes to a recent COVID-19 infection.  She has been trying to regulate her sleep cycle.  Discussed the importance of regular exercise and good sleep hygiene.  Raynaud's syndrome without gangrene: Not  currently active.   Other medical conditions are listed as follows:  Fibromuscular dysplasia (HCC) - Multifocal fibromuscular dysplasia of the bilateral internal carotid arteries-confirmed by  catheter-based angiography-Followed by Dr. Selena Batten.  Erythema nodosum  History of mitral valve prolapse - Dr. Selena Batten  History of migraine  Vitamin D deficiency: She is taking vitamin D 2000 units daily.     Orders: Orders Placed This Encounter  Procedures   QuantiFERON-TB Gold Plus   No orders of the defined types were placed in this encounter.    Follow-Up Instructions: Return in about 3 months (around 03/20/2023) for Rheumatoid arthritis, Polymyositis.   Gearldine Bienenstock, PA-C  Note - This record has been created using Dragon software.  Chart creation errors have been sought, but may not always  have been located. Such creation errors do not reflect on  the standard of medical care.

## 2022-12-12 DIAGNOSIS — F4310 Post-traumatic stress disorder, unspecified: Secondary | ICD-10-CM | POA: Diagnosis not present

## 2022-12-17 DIAGNOSIS — F4310 Post-traumatic stress disorder, unspecified: Secondary | ICD-10-CM | POA: Diagnosis not present

## 2022-12-18 ENCOUNTER — Ambulatory Visit: Payer: Federal, State, Local not specified - PPO | Attending: Physician Assistant | Admitting: Physician Assistant

## 2022-12-18 ENCOUNTER — Other Ambulatory Visit: Payer: Self-pay | Admitting: Physician Assistant

## 2022-12-18 ENCOUNTER — Encounter: Payer: Self-pay | Admitting: Physician Assistant

## 2022-12-18 VITALS — BP 117/76 | HR 72 | Resp 18 | Ht 68.0 in | Wt 301.2 lb

## 2022-12-18 DIAGNOSIS — M62838 Other muscle spasm: Secondary | ICD-10-CM

## 2022-12-18 DIAGNOSIS — L52 Erythema nodosum: Secondary | ICD-10-CM

## 2022-12-18 DIAGNOSIS — Z8669 Personal history of other diseases of the nervous system and sense organs: Secondary | ICD-10-CM

## 2022-12-18 DIAGNOSIS — M19042 Primary osteoarthritis, left hand: Secondary | ICD-10-CM

## 2022-12-18 DIAGNOSIS — E559 Vitamin D deficiency, unspecified: Secondary | ICD-10-CM

## 2022-12-18 DIAGNOSIS — G5603 Carpal tunnel syndrome, bilateral upper limbs: Secondary | ICD-10-CM

## 2022-12-18 DIAGNOSIS — M19071 Primary osteoarthritis, right ankle and foot: Secondary | ICD-10-CM

## 2022-12-18 DIAGNOSIS — M0579 Rheumatoid arthritis with rheumatoid factor of multiple sites without organ or systems involvement: Secondary | ICD-10-CM

## 2022-12-18 DIAGNOSIS — R768 Other specified abnormal immunological findings in serum: Secondary | ICD-10-CM

## 2022-12-18 DIAGNOSIS — M3320 Polymyositis, organ involvement unspecified: Secondary | ICD-10-CM

## 2022-12-18 DIAGNOSIS — Z79899 Other long term (current) drug therapy: Secondary | ICD-10-CM

## 2022-12-18 DIAGNOSIS — M19041 Primary osteoarthritis, right hand: Secondary | ICD-10-CM

## 2022-12-18 DIAGNOSIS — Z111 Encounter for screening for respiratory tuberculosis: Secondary | ICD-10-CM

## 2022-12-18 DIAGNOSIS — I773 Arterial fibromuscular dysplasia: Secondary | ICD-10-CM

## 2022-12-18 DIAGNOSIS — I7300 Raynaud's syndrome without gangrene: Secondary | ICD-10-CM

## 2022-12-18 DIAGNOSIS — J849 Interstitial pulmonary disease, unspecified: Secondary | ICD-10-CM

## 2022-12-18 DIAGNOSIS — Z8679 Personal history of other diseases of the circulatory system: Secondary | ICD-10-CM

## 2022-12-18 DIAGNOSIS — R5383 Other fatigue: Secondary | ICD-10-CM

## 2022-12-18 DIAGNOSIS — J479 Bronchiectasis, uncomplicated: Secondary | ICD-10-CM

## 2022-12-18 DIAGNOSIS — M797 Fibromyalgia: Secondary | ICD-10-CM

## 2022-12-18 DIAGNOSIS — M19072 Primary osteoarthritis, left ankle and foot: Secondary | ICD-10-CM

## 2022-12-18 NOTE — Telephone Encounter (Signed)
Last Fill: 11/19/2022  Labs: 11/20/2022 CBC and CMP are normal.   Next Visit: 12/18/2022  Last Visit: 09/19/2022  DX: Polymyositis   Current Dose per office note on 09/19/2022: Imuran 100 mg by mouth daily.   Okay to refill Imuran?

## 2022-12-18 NOTE — Patient Instructions (Signed)
Standing Labs We placed an order today for your standing lab work.   Please have your standing labs drawn in July and every 3 months   Please have your labs drawn 2 weeks prior to your appointment so that the provider can discuss your lab results at your appointment, if possible.  Please note that you may see your imaging and lab results in MyChart before we have reviewed them. We will contact you once all results are reviewed. Please allow our office up to 72 hours to thoroughly review all of the results before contacting the office for clarification of your results.  WALK-IN LAB HOURS  Monday through Thursday from 8:00 am -12:30 pm and 1:00 pm-5:00 pm and Friday from 8:00 am-12:00 pm.  Patients with office visits requiring labs will be seen before walk-in labs.  You may encounter longer than normal wait times. Please allow additional time. Wait times may be shorter on  Monday and Thursday afternoons.  We do not book appointments for walk-in labs. We appreciate your patience and understanding with our staff.   Labs are drawn by [facility]. Please bring your co-pay at the time of your lab draw.  You may receive a bill from [facility] for your lab work.  Please note if you are on Hydroxychloroquine and and an order has been placed for a Hydroxychloroquine level,  you will need to have it drawn 4 hours or more after your last dose.  If you wish to have your labs drawn at another location, please call the office 24 hours in advance so we can fax the orders.  The office is located at [address], [address], [place], [place] [address]   If you have any questions regarding directions or hours of operation,  please call [phone].   As a reminder, please drink plenty of water prior to coming for your lab work. Thanks!  

## 2022-12-26 DIAGNOSIS — F4310 Post-traumatic stress disorder, unspecified: Secondary | ICD-10-CM | POA: Diagnosis not present

## 2022-12-27 DIAGNOSIS — J471 Bronchiectasis with (acute) exacerbation: Secondary | ICD-10-CM | POA: Diagnosis not present

## 2022-12-27 DIAGNOSIS — G4733 Obstructive sleep apnea (adult) (pediatric): Secondary | ICD-10-CM | POA: Diagnosis not present

## 2023-01-02 DIAGNOSIS — F4310 Post-traumatic stress disorder, unspecified: Secondary | ICD-10-CM | POA: Diagnosis not present

## 2023-01-07 DIAGNOSIS — F4310 Post-traumatic stress disorder, unspecified: Secondary | ICD-10-CM | POA: Diagnosis not present

## 2023-01-07 NOTE — Progress Notes (Unsigned)
Subjective:    Patient ID: Jill Harper, female    DOB: Jun 10, 1966, 58 y.o.   MRN: [id number]      HPI Jill Harper is here for No chief complaint on file.    Pain left ribs   History of right-sided rib pain 03/2019-thought to be costochondritis.  Relieved by  aleve bid x 1 week   Medications and allergies reviewed with patient and updated if appropriate.  Current Outpatient Medications on File Prior to Visit  Medication Sig Dispense Refill   albuterol (PROVENTIL HFA) 108 (90 Base) MCG/ACT inhaler Inhale 2 puffs into the lungs 4 (four) times daily as needed. Reported on 09/03/2015 1 each 4   albuterol (PROVENTIL) (2.5 MG/3ML) 0.083% nebulizer solution Take 3 mLs (2.5 mg total) by nebulization every 6 (six) hours as needed for wheezing or shortness of breath. 120 vial 5   aspirin EC 81 MG tablet Take 81 mg by mouth daily.     atenolol (TENORMIN) 25 MG tablet Take 1 tablet (25 mg total) by mouth daily. 90 tablet 2   azaTHIOprine (IMURAN) 50 MG tablet TAKE 2 TABLETS BY MOUTH DAILY 180 tablet 0   Cholecalciferol (VITAMIN D) 50 MCG (2000 UT) CAPS Take by mouth.     diclofenac Sodium (VOLTAREN) 1 % GEL APPLY 2 TO 4 GRAMS TOPICALLY TO AFFECTED JOINT FOUR TIMES DAILY AS NEEDED 400 g 2   DULoxetine (CYMBALTA) 30 MG capsule TAKE ONE CAPSULE BY MOUTH DAILY FOR TOTAL OF 90 MG DAILY 30 capsule 3   DULoxetine (CYMBALTA) 60 MG capsule TAKE 1 CAPSULE(60 MG) BY MOUTH DAILY 90 capsule 1   ENBREL MINI 50 MG/ML SOCT INSERT 1 MINI CARTRIDGE INTO AUTOINJECTOR AND INJECT 50 MG UNDER THE SKIN EVERY 7 DAYS 12 mL 0   gabapentin (NEURONTIN) 100 MG capsule Take 3 capsules (300 mg total) by mouth at bedtime. 270 capsule 3   Galcanezumab-gnlm (EMGALITY) 120 MG/ML SOAJ ADMINISTER 1 ML UNDER THE SKIN EVERY 30 DAYS 3 mL 3   guaiFENesin (MUCINEX) 600 MG 12 hr tablet Take 2 tablets (1,200 mg total) by mouth 2 (two) times daily as needed for cough or to loosen phlegm. (Patient taking differently: Take 1,200 mg by mouth 2  (two) times daily.)     meclizine (ANTIVERT) 25 MG tablet Take 1 tablet (25 mg total) by mouth 3 (three) times daily as needed for dizziness. 30 tablet 0   metFORMIN (GLUCOPHAGE) 500 MG tablet TAKE 1 TABLET(500 MG) BY MOUTH TWICE DAILY WITH A MEAL 180 tablet 1   methocarbamol (ROBAXIN) 500 MG tablet Take 1 tablet (500 mg total) by mouth every 8 (eight) hours as needed. 60 tablet 3   Multiple Vitamins-Minerals (MULTIVITAMIN GUMMIES ADULT PO) Take 2 each by mouth daily.     Rimegepant Sulfate (NURTEC) 75 MG TBDP DISSOLVE ONE TABLET BY MOUTH DAILY AS NEEDED FOR MIGRAINES. TAKE AS CLOSE TO ONSET OF MIGRAINE AS POSSIBLE. 1 TABLET DAILY MAXIMUM. 15 tablet 11   sodium chloride HYPERTONIC 3 % nebulizer solution Take by nebulization as needed for other. 750 mL 12   telmisartan (MICARDIS) 20 MG tablet TAKE 1 TABLET(20 MG) BY MOUTH DAILY 90 tablet 1   TRELEGY ELLIPTA 100-62.5-25 MCG/ACT AEPB INHALE 1 PUFF INTO THE LUNGS DAILY 60 each 5   No current facility-administered medications on file prior to visit.    Review of Systems     Objective:  There were no vitals filed for this visit. BP Readings from Last 3 Encounters:  12/18/22 117/76  11/21/22 130/72  11/14/22 128/80   Wt Readings from Last 3 Encounters:  12/18/22 (!) 301 lb 3.2 oz (136.6 kg)  11/21/22 294 lb (133.4 kg)  11/14/22 292 lb (132.5 kg)   There is no height or weight on file to calculate BMI.    Physical Exam         Assessment & Plan:    See Problem List for Assessment and Plan of chronic medical problems.

## 2023-01-08 ENCOUNTER — Ambulatory Visit: Payer: Federal, State, Local not specified - PPO | Admitting: Internal Medicine

## 2023-01-08 ENCOUNTER — Encounter: Payer: Self-pay | Admitting: Internal Medicine

## 2023-01-08 VITALS — BP 120/76 | HR 78 | Temp 98.3°F | Ht 68.0 in | Wt 296.0 lb

## 2023-01-08 DIAGNOSIS — R0781 Pleurodynia: Secondary | ICD-10-CM | POA: Diagnosis not present

## 2023-01-08 DIAGNOSIS — R0789 Other chest pain: Secondary | ICD-10-CM

## 2023-01-08 DIAGNOSIS — I10 Essential (primary) hypertension: Secondary | ICD-10-CM

## 2023-01-08 NOTE — Patient Instructions (Addendum)
       Medications changes include :   continue what you are doing.      Return if symptoms worsen or fail to improve.

## 2023-01-08 NOTE — Assessment & Plan Note (Signed)
Chronic Blood pressure well-controlled Continue current medications-atenolol 25 mg daily, telmisartan 20 mg daily 

## 2023-01-08 NOTE — Assessment & Plan Note (Addendum)
Acute Started last week Agree pain is musculoskeletal in nature And has improved with conservative measures-ice, heat, Tylenol alternating with Aleve She also took a muscle relaxer which may have helped Pain has improved, but still there No concerning symptoms of pneumonia, infection or cardiac cause Continue conservative treatment-Aleve alternating with Tylenol, ice and heat Continue methocarbamol 500 mg every 8 hours as needed Advised stretching Call if symptoms do not continue to improve

## 2023-01-14 DIAGNOSIS — F4310 Post-traumatic stress disorder, unspecified: Secondary | ICD-10-CM | POA: Diagnosis not present

## 2023-01-21 DIAGNOSIS — F4310 Post-traumatic stress disorder, unspecified: Secondary | ICD-10-CM | POA: Diagnosis not present

## 2023-01-28 DIAGNOSIS — F4310 Post-traumatic stress disorder, unspecified: Secondary | ICD-10-CM | POA: Diagnosis not present

## 2023-02-04 DIAGNOSIS — F4310 Post-traumatic stress disorder, unspecified: Secondary | ICD-10-CM | POA: Diagnosis not present

## 2023-02-09 ENCOUNTER — Other Ambulatory Visit: Payer: Self-pay | Admitting: Internal Medicine

## 2023-02-11 DIAGNOSIS — F4310 Post-traumatic stress disorder, unspecified: Secondary | ICD-10-CM | POA: Diagnosis not present

## 2023-02-18 DIAGNOSIS — F4310 Post-traumatic stress disorder, unspecified: Secondary | ICD-10-CM | POA: Diagnosis not present

## 2023-02-19 ENCOUNTER — Other Ambulatory Visit: Payer: Self-pay | Admitting: Nurse Practitioner

## 2023-02-20 ENCOUNTER — Other Ambulatory Visit: Payer: Self-pay | Admitting: Physician Assistant

## 2023-02-20 DIAGNOSIS — M0579 Rheumatoid arthritis with rheumatoid factor of multiple sites without organ or systems involvement: Secondary | ICD-10-CM

## 2023-02-20 NOTE — Telephone Encounter (Signed)
Last Fill: 11/27/2022  Labs: 11/20/2022 CBC and CMP are normal.   TB Gold: 02/15/2023 Neg    Next Visit: 03/20/2023  Last Visit: 12/18/2022  WJ:[id number]   Current Dose per office note 12/18/2022: Enbrel 50 mg sq injections once weekly   Patient advised she is due to update labs.   Okay to refill Enbrel?

## 2023-02-25 DIAGNOSIS — F4310 Post-traumatic stress disorder, unspecified: Secondary | ICD-10-CM | POA: Diagnosis not present

## 2023-02-26 ENCOUNTER — Other Ambulatory Visit: Payer: Self-pay | Admitting: *Deleted

## 2023-02-26 DIAGNOSIS — Z79899 Other long term (current) drug therapy: Secondary | ICD-10-CM | POA: Diagnosis not present

## 2023-02-26 DIAGNOSIS — Z111 Encounter for screening for respiratory tuberculosis: Secondary | ICD-10-CM | POA: Diagnosis not present

## 2023-02-26 DIAGNOSIS — M3320 Polymyositis, organ involvement unspecified: Secondary | ICD-10-CM | POA: Diagnosis not present

## 2023-02-26 LAB — CBC WITH DIFFERENTIAL/PLATELET
HCT: 37.8 % (ref 35.0–45.0)
Lymphs Abs: 1501 cells/uL (ref 850–3900)
Neutro Abs: 4442 cells/uL (ref 1500–7800)
RDW: 12.9 % (ref 11.0–15.0)
Total Lymphocyte: 22.4 %

## 2023-02-27 ENCOUNTER — Encounter (HOSPITAL_BASED_OUTPATIENT_CLINIC_OR_DEPARTMENT_OTHER): Payer: Self-pay | Admitting: Pulmonary Disease

## 2023-02-27 ENCOUNTER — Ambulatory Visit (HOSPITAL_BASED_OUTPATIENT_CLINIC_OR_DEPARTMENT_OTHER): Payer: Federal, State, Local not specified - PPO | Admitting: Pulmonary Disease

## 2023-02-27 VITALS — BP 124/78 | HR 73 | Resp 18 | Ht 68.0 in | Wt 272.8 lb

## 2023-02-27 DIAGNOSIS — G4733 Obstructive sleep apnea (adult) (pediatric): Secondary | ICD-10-CM | POA: Diagnosis not present

## 2023-02-27 DIAGNOSIS — J479 Bronchiectasis, uncomplicated: Secondary | ICD-10-CM | POA: Diagnosis not present

## 2023-02-27 DIAGNOSIS — G4734 Idiopathic sleep related nonobstructive alveolar hypoventilation: Secondary | ICD-10-CM | POA: Diagnosis not present

## 2023-02-27 DIAGNOSIS — J4540 Moderate persistent asthma, uncomplicated: Secondary | ICD-10-CM

## 2023-02-27 DIAGNOSIS — J849 Interstitial pulmonary disease, unspecified: Secondary | ICD-10-CM

## 2023-02-27 LAB — COMPLETE METABOLIC PANEL WITH GFR
AG Ratio: 1.3 (calc) (ref 1.0–2.5)
AST: 16 U/L (ref 10–35)
Albumin: 4 g/dL (ref 3.6–5.1)
Alkaline phosphatase (APISO): 61 U/L (ref 37–153)
Calcium: 9.3 mg/dL (ref 8.6–10.4)
Creat: 0.63 mg/dL (ref 0.50–1.03)
Potassium: 4.7 mmol/L (ref 3.5–5.3)
Sodium: 140 mmol/L (ref 135–146)
Total Bilirubin: 0.5 mg/dL (ref 0.2–1.2)
Total Protein: 7 g/dL (ref 6.1–8.1)
eGFR: 103 mL/min/{1.73_m2} (ref >=60)

## 2023-02-27 LAB — CBC WITH DIFFERENTIAL/PLATELET
Basophils Relative: 0.6 %
Eosinophils Absolute: 168 cells/uL (ref 15–500)
Hemoglobin: 12.4 g/dL (ref 11.7–15.5)
MCHC: 32.8 g/dL (ref 32.0–36.0)
Monocytes Relative: 8.2 %
RBC: 4.48 10*6/uL (ref 3.80–5.10)

## 2023-02-27 NOTE — Progress Notes (Signed)
[facility]  Chief Complaint  Patient presents with   Follow-up    4 month fu. Doing pretty good.     Constitutional:  BP 124/78   Pulse 73   Resp 18   Ht 5\' 8"  (1.727 m)   Wt 272 lb 12.8 oz (123.7 kg)   SpO2 97%   BMI 41.48 kg/m   Past Medical History:  Fibromyalgia, Migraine HA, MVP, OA, Fibromuscular dysplasia, Erythema nodosum, Vit D deficiency  Past Surgical History:  Her  has a past surgical history that includes Cardiac catheterization (2002); Dilation and curettage of uterus (08-31-08); ablation uterine (2010); IR ANGIO VERTEBRAL SEL VERTEBRAL BILAT MOD SED (09/18/2018); IR ANGIO INTRA EXTRACRAN SEL COM CAROTID INNOMINATE BILAT MOD SED (09/18/2018); and IR US Guide Vasc Access Right (09/18/2018).  Brief Summary:  Jill Harper is a 57 y.o. female with bronchiectasis in the setting of seropositive rheumatoid arthritis and polymyositis, and obstructive sleep apnea.      Subjective:   Doing better since last visit.  Has up and down days, but more up than down now.  Has more trouble in hot, humid weather.  Developed costochondritis and had to stop using chest vest.  Has intermittent cough with clear sputum.  No issues using CPAP.  Uses oxygen at night with CPAP and prn during the day.  Not getting headaches at night anymore.      Physical Exam:   Appearance - well kempt   ENMT - no sinus tenderness, no oral exudate, no LAN, Mallampati 3 airway, no stridor  Respiratory - equal breath sounds bilaterally, no wheezing or rales  CV - s1s2 regular rate and rhythm, no murmurs  Ext - no clubbing, no edema  Skin - no rashes  Psych - normal mood and affect         Pulmonary testing:  PFT 10/30/11 >> FEV1 3.31 (114%), FEV1% 85, TLC 6.06 (108%), DLCO 77%, no BD PFT 11/18/12 >> FEV1 3.18 (111%), FEV1% 84, TLC 5.34 (95%), DLCO 67%, no BD PFT 12/02/15 >> FEV1 2.96 (108%), FEV1% 92, TLC 4.95 (86%), DLCO 65%, no BD PFT 04/17/18 >>  FEV1 2.46 (101%), FEV1% 90, DLCO 76% PFT 02/05/19 >> FEV1 2.30 (87%), FEV1% 89, TLC 4.21 (74%), DLCO 81%, no BD PFT 02/17/20 >> FEV1 2.18 (83%), FEV1% 90, TLC 4.22 (74%), DLCO 78% PFT 07/27/21 >> FEV1 2.41 (93%), FEV1% 89, TLC 4.04 (71%), DLCO 80%  Chest Imaging:  CT chest 03/23/05 >> patchy b/l lower lung ASD with cylindrical BTX CT chest 02/17/08 >> peripheral and basilar predominant subpleural GGO CT chest 12/01/08 >> no change HRCT chest 09/07/15 >> scattered GGO HRCT chest 04/15/18 >> patchy confluent subpleural and peripheral peribronchovascular reticulation and ground-glass opacity throughout both lungs with a strong basilar gradient, traction BTX HRCT chest 03/02/20 >> widespread patchy GGO, septal thickening, mild cylindrical BTX, craniocaudal gradient with mild progression, mild air trapping HRCT chest 10/25/21 >> patchy GGO, septal thickening, small amount of subpleural reticulation, mild cylindrical BTX without signifcant change (fibrotic NSIP versus probable UIP)  Labs:  Quantiferon gold 01/31/17 >> negative Serology 01/31/17 >> RF 29, CCP 213, Jo-1 > 8; ANA, SCL 70, SSA/SSB, ENA Sm negative Quantiferon gold 01/31/17 >> negative Ig 01/31/17 >> IgG 1992, IgA 147, IgM 136  Sleep Tests:  HST 07/28/18 >> AHI 5, SpO2 low 83% ONO with CPAP 11/05/18 >> test time 6 hrs 24 min, baseline SpO2 90%, low SpO2 80%; spent 1 hr 15 min with  SpO2 < 88% Auto CPAP 01/29/23 to 02/27/23 >> used on 29 of 30 nights with average 11 hrs 56 min.  Average AHI 3.4 with median CPAP 9 and 95 th percentile CPAP 13 cm H2O  Cardiac Tests:  Echo 04/20/22 >> EF 55 to 60%, mild LVH  Social History:  She  reports that she has never smoked. She has never been exposed to tobacco smoke. She has never used smokeless tobacco. She reports that she does not currently use alcohol. She reports that she does not use drugs.  Family History:  Her family history includes Diabetes in her brother, mother, sister, and sister; Fibromyalgia in  her mother; Headache in an other family member; Heart attack in her sister; Heart disease in her brother; Heart failure in her mother; Hypertension in her father, sister, and sister; Lupus in her sister; Migraines in her sister; Multiple myeloma in her father and sister; Obesity in her mother; Other in her sister; Rheum arthritis in her sister and sister; Stroke in her father; Thyroid disease in her mother; Ulcers in her mother.      Assessment/Plan:   ILD with UIP pattern and Bronchiectasis in setting of RA and polymyositis. Moderate, persistent asthma. - continue trelegy 100 one puff daily - continue mucinex, albuterol, hypertonic saline bid - will see if she can start using chest vest again; explained she can contact the supply company to have setting adjusted if she develops chest discomfort - she isn't able to tolerate flutter valve due to worsening headache  Rheumatoid arthritis with positive RF and CCP, polymyositis. - followed by Dr. Pollyann Savoy with rheumatology - maintained on imuran and enbrel   Obstructive sleep apnea. - she is compliant with CPAP and reports benefit from therapy - she uses [employer] for her DME - her current CPAP was ordered on 08/20/18 - continue auto CPAP 7 to 15 cm H2O   Nocturnal hypoxemia. - 2.5 liters oxygen at night with CPAP and prn during the day with dyspnea and headache  Migraine headache. - followed by Dr. Naomie Dean with [facility]  Time Spent Involved in Patient Care on Day of Examination:  26 minutes  Follow up:   Patient Instructions  Follow up in 4 months  Medication List:   Allergies as of 02/27/2023       Reactions   Sulfonamide Derivatives    REACTION: hives        Medication List        Accurate as of February 27, 2023 11:23 AM. If you have any questions, ask your nurse or doctor.          albuterol (2.5 MG/3ML) 0.083% nebulizer solution Commonly known as: PROVENTIL Take 3 mLs (2.5 mg total) by  nebulization every 6 (six) hours as needed for wheezing or shortness of breath.   albuterol 108 (90 Base) MCG/ACT inhaler Commonly known as: Proventil HFA Inhale 2 puffs into the lungs 4 (four) times daily as needed. Reported on 09/03/2015   aspirin EC 81 MG tablet Take 81 mg by mouth daily.   atenolol 25 MG tablet Commonly known as: TENORMIN TAKE 1 TABLET(25 MG) BY MOUTH DAILY   azaTHIOprine 50 MG tablet Commonly known as: IMURAN TAKE 2 TABLETS BY MOUTH DAILY   diclofenac Sodium 1 % Gel Commonly known as: VOLTAREN APPLY 2 TO 4 GRAMS TOPICALLY TO AFFECTED JOINT FOUR TIMES DAILY AS NEEDED   DULoxetine 30 MG capsule Commonly known as: CYMBALTA TAKE ONE CAPSULE BY MOUTH DAILY FOR TOTAL  OF 90 MG DAILY   DULoxetine 60 MG capsule Commonly known as: CYMBALTA TAKE 1 CAPSULE(60 MG) BY MOUTH DAILY   Emgality 120 MG/ML Soaj Generic drug: Galcanezumab-gnlm ADMINISTER 1 ML UNDER THE SKIN EVERY 30 DAYS   Enbrel Mini 50 MG/ML injection Generic drug: etanercept INSERT 1 MINI CARTRIDGE INTO AUTOINJECTOR AND INJECT 50 MG UNDER THE SKIN EVERY 7 DAYS   gabapentin 100 MG capsule Commonly known as: NEURONTIN Take 3 capsules (300 mg total) by mouth at bedtime.   guaiFENesin 600 MG 12 hr tablet Commonly known as: Mucinex Take 2 tablets (1,200 mg total) by mouth 2 (two) times daily as needed for cough or to loosen phlegm. What changed: when to take this   meclizine 25 MG tablet Commonly known as: ANTIVERT Take 1 tablet (25 mg total) by mouth 3 (three) times daily as needed for dizziness.   metFORMIN 500 MG tablet Commonly known as: GLUCOPHAGE TAKE 1 TABLET(500 MG) BY MOUTH TWICE DAILY WITH A MEAL   methocarbamol 500 MG tablet Commonly known as: ROBAXIN Take 1 tablet (500 mg total) by mouth every 8 (eight) hours as needed.   MULTIVITAMIN GUMMIES ADULT PO Take 2 each by mouth daily.   Nurtec 75 MG Tbdp Generic drug: Rimegepant Sulfate DISSOLVE ONE TABLET BY MOUTH DAILY AS NEEDED  FOR MIGRAINES. TAKE AS CLOSE TO ONSET OF MIGRAINE AS POSSIBLE. 1 TABLET DAILY MAXIMUM.   sodium chloride HYPERTONIC 3 % nebulizer solution Take by nebulization as needed for other.   telmisartan 20 MG tablet Commonly known as: MICARDIS TAKE 1 TABLET(20 MG) BY MOUTH DAILY   Trelegy Ellipta 100-62.5-25 MCG/ACT Aepb Generic drug: Fluticasone-Umeclidin-Vilant INHALE 1 PUFF INTO THE LUNGS DAILY   Vitamin D 50 MCG (2000 UT) Caps Take by mouth.        Signature:  [name], MD [facility]monary/Critical [facility] Pager - [phone] 02/27/2023, 11:23 AM

## 2023-02-27 NOTE — Patient Instructions (Signed)
Follow up in 4 months 

## 2023-02-27 NOTE — Progress Notes (Signed)
CBC and CMP are normal.

## 2023-02-27 NOTE — Progress Notes (Signed)
CK WNL

## 2023-02-28 LAB — COMPLETE METABOLIC PANEL WITH GFR
ALT: 14 U/L (ref 6–29)
BUN: 8 mg/dL (ref 7–25)
CO2: 27 mmol/L (ref 20–32)
Chloride: 108 mmol/L (ref 98–110)
Globulin: 3 g/dL (calc) (ref 1.9–3.7)
Glucose, Bld: 108 mg/dL — ABNORMAL HIGH (ref 65–99)

## 2023-02-28 LAB — CBC WITH DIFFERENTIAL/PLATELET
Absolute Monocytes: 549 cells/uL (ref 200–950)
Basophils Absolute: 40 cells/uL (ref 0–200)
Eosinophils Relative: 2.5 %
MCH: 27.7 pg (ref 27.0–33.0)
MCV: 84.4 fL (ref 80.0–100.0)
MPV: 11.2 fL (ref 7.5–12.5)
Neutrophils Relative %: 66.3 %
Platelets: 257 10*3/uL (ref 140–400)
WBC: 6.7 10*3/uL (ref 3.8–10.8)

## 2023-02-28 LAB — QUANTIFERON-TB GOLD PLUS
Mitogen-NIL: 8.8 IU/mL
NIL: 0.04 IU/mL
TB1-NIL: <0 IU/mL
TB2-NIL: <0 IU/mL

## 2023-02-28 LAB — CK: Total CK: 70 U/L (ref 29–143)

## 2023-02-28 NOTE — Progress Notes (Signed)
TB gold negative

## 2023-03-04 DIAGNOSIS — F4310 Post-traumatic stress disorder, unspecified: Secondary | ICD-10-CM | POA: Diagnosis not present
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
## 2023-03-12 ENCOUNTER — Encounter (INDEPENDENT_AMBULATORY_CARE_PROVIDER_SITE_OTHER): Payer: Federal, State, Local not specified - PPO | Admitting: Internal Medicine

## 2023-03-12 DIAGNOSIS — G479 Sleep disorder, unspecified: Secondary | ICD-10-CM

## 2023-03-12 DIAGNOSIS — F419 Anxiety disorder, unspecified: Secondary | ICD-10-CM

## 2023-03-13 ENCOUNTER — Other Ambulatory Visit: Payer: Self-pay | Admitting: Internal Medicine

## 2023-03-13 DIAGNOSIS — G479 Sleep disorder, unspecified: Secondary | ICD-10-CM

## 2023-03-13 DIAGNOSIS — F419 Anxiety disorder, unspecified: Secondary | ICD-10-CM

## 2023-03-13 DIAGNOSIS — F4310 Post-traumatic stress disorder, unspecified: Secondary | ICD-10-CM | POA: Diagnosis not present

## 2023-03-14 NOTE — Telephone Encounter (Signed)

## 2023-03-15 ENCOUNTER — Other Ambulatory Visit: Payer: Self-pay | Admitting: Internal Medicine

## 2023-03-15 DIAGNOSIS — R7303 Prediabetes: Secondary | ICD-10-CM

## 2023-03-18 ENCOUNTER — Other Ambulatory Visit: Payer: Self-pay | Admitting: Physician Assistant

## 2023-03-18 DIAGNOSIS — Z13820 Encounter for screening for osteoporosis: Secondary | ICD-10-CM | POA: Diagnosis not present

## 2023-03-18 DIAGNOSIS — F4310 Post-traumatic stress disorder, unspecified: Secondary | ICD-10-CM | POA: Diagnosis not present

## 2023-03-18 DIAGNOSIS — M0579 Rheumatoid arthritis with rheumatoid factor of multiple sites without organ or systems involvement: Secondary | ICD-10-CM

## 2023-03-18 DIAGNOSIS — Z1231 Encounter for screening mammogram for malignant neoplasm of breast: Secondary | ICD-10-CM | POA: Diagnosis not present

## 2023-03-18 LAB — HM DEXA SCAN

## 2023-03-19 NOTE — Telephone Encounter (Signed)
Last Fill: 02/20/2023 (30 day supply)   Labs: 47/23/2024 CBC and CMP are normal    TB Gold: 02/15/2023 Neg     Next Visit: 03/20/2023   Last Visit: 12/18/2022   [id number]    Current Dose per office note 12/18/2022: Enbrel 50 mg sq injections once weekly    Okay to refill Enbrel?

## 2023-03-20 ENCOUNTER — Encounter: Payer: Self-pay | Admitting: Physician Assistant

## 2023-03-20 ENCOUNTER — Ambulatory Visit: Payer: Federal, State, Local not specified - PPO | Attending: Physician Assistant | Admitting: Physician Assistant

## 2023-03-20 ENCOUNTER — Other Ambulatory Visit: Payer: Self-pay | Admitting: Physician Assistant

## 2023-03-20 VITALS — BP 117/71 | HR 78 | Ht 67.5 in | Wt 301.0 lb

## 2023-03-20 DIAGNOSIS — L52 Erythema nodosum: Secondary | ICD-10-CM

## 2023-03-20 DIAGNOSIS — G5603 Carpal tunnel syndrome, bilateral upper limbs: Secondary | ICD-10-CM

## 2023-03-20 DIAGNOSIS — R5383 Other fatigue: Secondary | ICD-10-CM

## 2023-03-20 DIAGNOSIS — I7300 Raynaud's syndrome without gangrene: Secondary | ICD-10-CM

## 2023-03-20 DIAGNOSIS — M797 Fibromyalgia: Secondary | ICD-10-CM

## 2023-03-20 DIAGNOSIS — Z8669 Personal history of other diseases of the nervous system and sense organs: Secondary | ICD-10-CM

## 2023-03-20 DIAGNOSIS — J849 Interstitial pulmonary disease, unspecified: Secondary | ICD-10-CM

## 2023-03-20 DIAGNOSIS — J479 Bronchiectasis, uncomplicated: Secondary | ICD-10-CM

## 2023-03-20 DIAGNOSIS — I773 Arterial fibromuscular dysplasia: Secondary | ICD-10-CM

## 2023-03-20 DIAGNOSIS — M3320 Polymyositis, organ involvement unspecified: Secondary | ICD-10-CM | POA: Diagnosis not present

## 2023-03-20 DIAGNOSIS — M19041 Primary osteoarthritis, right hand: Secondary | ICD-10-CM

## 2023-03-20 DIAGNOSIS — M0579 Rheumatoid arthritis with rheumatoid factor of multiple sites without organ or systems involvement: Secondary | ICD-10-CM | POA: Diagnosis not present

## 2023-03-20 DIAGNOSIS — M19042 Primary osteoarthritis, left hand: Secondary | ICD-10-CM

## 2023-03-20 DIAGNOSIS — Z8679 Personal history of other diseases of the circulatory system: Secondary | ICD-10-CM

## 2023-03-20 DIAGNOSIS — R768 Other specified abnormal immunological findings in serum: Secondary | ICD-10-CM | POA: Diagnosis not present

## 2023-03-20 DIAGNOSIS — M19071 Primary osteoarthritis, right ankle and foot: Secondary | ICD-10-CM

## 2023-03-20 DIAGNOSIS — Z79899 Other long term (current) drug therapy: Secondary | ICD-10-CM

## 2023-03-20 DIAGNOSIS — M19072 Primary osteoarthritis, left ankle and foot: Secondary | ICD-10-CM

## 2023-03-20 DIAGNOSIS — M62838 Other muscle spasm: Secondary | ICD-10-CM

## 2023-03-20 NOTE — Patient Instructions (Signed)

## 2023-03-20 NOTE — Telephone Encounter (Signed)
Last Fill: 12/18/2022  Labs: 02/26/2023 CBC and CMP are normal.   Next Visit: 03/20/2023  Last Visit: 12/18/2022  DX: Polymyositis   Current Dose per office note 12/18/2022: Imuran 100 mg by mouth daily.   Okay to refill Imuran?

## 2023-03-25 ENCOUNTER — Ambulatory Visit: Payer: Federal, State, Local not specified - PPO | Admitting: Internal Medicine

## 2023-03-25 DIAGNOSIS — F4310 Post-traumatic stress disorder, unspecified: Secondary | ICD-10-CM | POA: Diagnosis not present

## 2023-03-27 ENCOUNTER — Other Ambulatory Visit: Payer: Self-pay | Admitting: Nurse Practitioner

## 2023-03-27 DIAGNOSIS — M810 Age-related osteoporosis without current pathological fracture: Secondary | ICD-10-CM | POA: Diagnosis not present

## 2023-03-27 DIAGNOSIS — Z01419 Encounter for gynecological examination (general) (routine) without abnormal findings: Secondary | ICD-10-CM | POA: Diagnosis not present

## 2023-03-27 DIAGNOSIS — J849 Interstitial pulmonary disease, unspecified: Secondary | ICD-10-CM

## 2023-03-27 DIAGNOSIS — J471 Bronchiectasis with (acute) exacerbation: Secondary | ICD-10-CM

## 2023-04-01 DIAGNOSIS — F4310 Post-traumatic stress disorder, unspecified: Secondary | ICD-10-CM | POA: Diagnosis not present

## 2023-04-10 ENCOUNTER — Other Ambulatory Visit: Payer: Self-pay | Admitting: Adult Health

## 2023-04-11 ENCOUNTER — Telehealth: Payer: Self-pay

## 2023-04-11 ENCOUNTER — Other Ambulatory Visit (HOSPITAL_COMMUNITY): Payer: Self-pay

## 2023-04-11 DIAGNOSIS — F4310 Post-traumatic stress disorder, unspecified: Secondary | ICD-10-CM | POA: Diagnosis not present

## 2023-04-11 NOTE — Telephone Encounter (Signed)
*  [facility]  Pharmacy Patient Advocate Encounter   Received notification from Fax that prior authorization for Nurtec 75MG  dispersible tablets  is required/requested.   Insurance verification completed.   The patient is insured through [employer] [facility] .   Per test claim: PA required; PA submitted to [facility] via CoverMyMeds Key/confirmation #/EOC [id number] Status is pending

## 2023-04-11 NOTE — Telephone Encounter (Signed)
Pharmacy Patient Advocate Encounter  Received notification from [employer] [facility] that Prior Authorization for Nurtec 75MG  dispersible tablets  has been APPROVED from 04/11/2023 to 04/09/2024

## 2023-04-16 DIAGNOSIS — F4310 Post-traumatic stress disorder, unspecified: Secondary | ICD-10-CM | POA: Diagnosis not present

## 2023-04-20 ENCOUNTER — Other Ambulatory Visit: Payer: Self-pay | Admitting: Internal Medicine

## 2023-04-23 DIAGNOSIS — F4310 Post-traumatic stress disorder, unspecified: Secondary | ICD-10-CM | POA: Diagnosis not present

## 2023-04-29 DIAGNOSIS — J471 Bronchiectasis with (acute) exacerbation: Secondary | ICD-10-CM | POA: Diagnosis not present

## 2023-04-29 DIAGNOSIS — G4733 Obstructive sleep apnea (adult) (pediatric): Secondary | ICD-10-CM | POA: Diagnosis not present

## 2023-04-30 DIAGNOSIS — F4310 Post-traumatic stress disorder, unspecified: Secondary | ICD-10-CM | POA: Diagnosis not present

## 2023-05-07 DIAGNOSIS — F4310 Post-traumatic stress disorder, unspecified: Secondary | ICD-10-CM | POA: Diagnosis not present

## 2023-05-10 ENCOUNTER — Other Ambulatory Visit: Payer: Self-pay | Admitting: Adult Health

## 2023-05-13 ENCOUNTER — Other Ambulatory Visit: Payer: Self-pay | Admitting: Adult Health

## 2023-05-14 NOTE — Patient Instructions (Addendum)
     Medications changes include :   cough syrup, augmentin and eye drops     Return if symptoms worsen or fail to improve.  

## 2023-05-14 NOTE — Progress Notes (Unsigned)
Subjective:    Patient ID: Jill Harper, female    DOB: [birth year], 57 y.o.   MRN: [id number]      HPI Jill Harper is here for No chief complaint on file.   She is here for an acute visit for cold symptoms.   Her symptoms started   She is experiencing   She has tried taking       Medications and allergies reviewed with patient and updated if appropriate.  Current Outpatient Medications on File Prior to Visit  Medication Sig Dispense Refill   albuterol (PROVENTIL) (2.5 MG/3ML) 0.083% nebulizer solution Take 3 mLs (2.5 mg total) by nebulization every 6 (six) hours as needed for wheezing or shortness of breath. 120 vial 5   albuterol (VENTOLIN HFA) 108 (90 Base) MCG/ACT inhaler INHALE 2 PUFFS BY MOUTH INTO THE LUNGS FOUR TIMES DAILY AS NEEDED 8.5 g 1   aspirin EC 81 MG tablet Take 81 mg by mouth daily.     atenolol (TENORMIN) 25 MG tablet TAKE 1 TABLET(25 MG) BY MOUTH DAILY 90 tablet 2   azaTHIOprine (IMURAN) 50 MG tablet TAKE 2 TABLETS BY MOUTH DAILY 180 tablet 0   Cholecalciferol (VITAMIN D) 50 MCG (2000 UT) CAPS Take by mouth.     diclofenac Sodium (VOLTAREN) 1 % GEL APPLY 2 TO 4 GRAMS TOPICALLY TO AFFECTED JOINT FOUR TIMES DAILY AS NEEDED 400 g 2   DULoxetine (CYMBALTA) 30 MG capsule TAKE ONE CAPSULE BY MOUTH DAILY FOR TOAL OF 90 MG DAILY 30 capsule 3   DULoxetine (CYMBALTA) 60 MG capsule TAKE 1 CAPSULE(60 MG) BY MOUTH DAILY 90 capsule 1   ENBREL MINI 50 MG/ML injection INSERT 1 MINI CARTRIDGE INTO AUTOINJECTOR AND INJECT 50 MG UNDER THE SKIN EVERY 7 DAYS 12 mL 0   gabapentin (NEURONTIN) 100 MG capsule TAKE 3 CAPSULES(300 MG) BY MOUTH AT BEDTIME 270 capsule 0   Galcanezumab-gnlm (EMGALITY) 120 MG/ML SOAJ ADMINISTER 1 ML UNDER THE SKIN EVERY 30 DAYS 3 mL 0   guaiFENesin (MUCINEX) 600 MG 12 hr tablet Take 2 tablets (1,200 mg total) by mouth 2 (two) times daily as needed for cough or to loosen phlegm. (Patient taking differently: Take 1,200 mg by mouth 2 (two) times daily.)      meclizine (ANTIVERT) 25 MG tablet Take 1 tablet (25 mg total) by mouth 3 (three) times daily as needed for dizziness. 30 tablet 0   metFORMIN (GLUCOPHAGE) 500 MG tablet TAKE 1 TABLET(500 MG) BY MOUTH TWICE DAILY WITH A MEAL 180 tablet 1   methocarbamol (ROBAXIN) 500 MG tablet Take 1 tablet (500 mg total) by mouth every 8 (eight) hours as needed. 60 tablet 3   Multiple Vitamins-Minerals (MULTIVITAMIN GUMMIES ADULT PO) Take 2 each by mouth daily.     Rimegepant Sulfate (NURTEC) 75 MG TBDP DISSOLVE ONE TABLET BY MOUTH DAILY AS NEEDED FOR MIGRAINES. TAKE AS CLOSE TO ONSET OF MIGRAINE AS POSSIBLE. 1 TABLET DAILY MAXIMUM. 15 tablet 11   sodium chloride HYPERTONIC 3 % nebulizer solution Take by nebulization as needed for other. 750 mL 12   telmisartan (MICARDIS) 20 MG tablet TAKE 1 TABLET(20 MG) BY MOUTH DAILY 90 tablet 1   TRELEGY ELLIPTA 100-62.5-25 MCG/ACT AEPB INHALE 1 PUFF INTO THE LUNGS DAILY 60 each 5   No current facility-administered medications on file prior to visit.    Review of Systems     Objective:  There were no vitals filed for this visit. BP Readings from Last 3 Encounters:  03/20/23 117/71  02/27/23 124/78  01/08/23 120/76   Wt Readings from Last 3 Encounters:  03/20/23 (!) 301 lb (136.5 kg)  02/27/23 272 lb 12.8 oz (123.7 kg)  01/08/23 296 lb (134.3 kg)   There is no height or weight on file to calculate BMI.    Physical Exam Constitutional:      General: She is not in acute distress.    Appearance: Normal appearance. She is not ill-appearing.  HENT:     Head: Normocephalic and atraumatic.     Right Ear: Tympanic membrane, ear canal and external ear normal.     Left Ear: Tympanic membrane, ear canal and external ear normal.     Mouth/Throat:     Mouth: Mucous membranes are moist.     Pharynx: No oropharyngeal exudate or posterior oropharyngeal erythema.  Eyes:     Conjunctiva/sclera: Conjunctivae normal.  Cardiovascular:     Rate and Rhythm: Normal rate and  regular rhythm.  Pulmonary:     Effort: Pulmonary effort is normal. No respiratory distress.     Breath sounds: Normal breath sounds. No wheezing or rales.  Musculoskeletal:     Cervical back: Neck supple. No tenderness.  Lymphadenopathy:     Cervical: No cervical adenopathy.  Skin:    General: Skin is warm and dry.  Neurological:     Mental Status: She is alert.            Assessment & Plan:    See Problem List for Assessment and Plan of chronic medical problems.

## 2023-05-15 ENCOUNTER — Encounter: Payer: Self-pay | Admitting: Internal Medicine

## 2023-05-15 ENCOUNTER — Telehealth: Payer: Self-pay | Admitting: Adult Health

## 2023-05-15 ENCOUNTER — Ambulatory Visit: Payer: Federal, State, Local not specified - PPO | Admitting: Internal Medicine

## 2023-05-15 VITALS — BP 130/84 | HR 76 | Temp 98.2°F | Ht 67.5 in | Wt 302.0 lb

## 2023-05-15 DIAGNOSIS — I10 Essential (primary) hypertension: Secondary | ICD-10-CM | POA: Diagnosis not present

## 2023-05-15 DIAGNOSIS — J069 Acute upper respiratory infection, unspecified: Secondary | ICD-10-CM | POA: Diagnosis not present

## 2023-05-15 DIAGNOSIS — R051 Acute cough: Secondary | ICD-10-CM

## 2023-05-15 DIAGNOSIS — J471 Bronchiectasis with (acute) exacerbation: Secondary | ICD-10-CM

## 2023-05-15 LAB — POC INFLUENZA A&B (BINAX/QUICKVUE)
Influenza A, POC: NEGATIVE
Influenza B, POC: NEGATIVE

## 2023-05-15 LAB — POC COVID19 BINAXNOW: SARS Coronavirus 2 Ag: NEGATIVE

## 2023-05-15 NOTE — Assessment & Plan Note (Signed)
Chronic Blood pressure well-controlled Continue current medications-atenolol 25 mg daily, telmisartan 20 mg daily 

## 2023-05-15 NOTE — Telephone Encounter (Signed)
..   Pt understands that although there may be some limitations with this type of visit, we will take all precautions to reduce any security or privacy concerns.  Pt understands that this will be treated like an in office visit and we will file with pt's insurance, and there may be a patient responsible charge related to this service. ? ?

## 2023-05-15 NOTE — Assessment & Plan Note (Signed)
Acute Symptoms likely viral in nature Rapid COVID and flu tests are negative At this point we both agree she does not need any antibiotics or steroids, but given her bronchiectasis we have a low threshold to start antibiotics and/or steroids Continue daily inhalers, neb treatments, pulmonary vest, oxygen via nasal cannula Start Mucinex Continue symptomatic treatment with over-the-counter cold medications, Tylenol/ibuprofen Increase rest and fluids Call if symptoms worsen or do not improve

## 2023-05-15 NOTE — Assessment & Plan Note (Signed)
Acute Secondary to viral URI Has been using her inhalers, neb treatments Using pulmonary vest On oxygen via nasal cannula Has not been taking Mucinex-advised to restart Has prescription cough syrup at home she can use, other over-the-counter cold medications if needed Will call if her symptoms worsen/do not improve

## 2023-05-16 ENCOUNTER — Telehealth: Payer: Federal, State, Local not specified - PPO | Admitting: Adult Health

## 2023-05-16 DIAGNOSIS — G4485 Primary stabbing headache: Secondary | ICD-10-CM

## 2023-05-16 DIAGNOSIS — G43009 Migraine without aura, not intractable, without status migrainosus: Secondary | ICD-10-CM | POA: Diagnosis not present

## 2023-05-16 DIAGNOSIS — G43709 Chronic migraine without aura, not intractable, without status migrainosus: Secondary | ICD-10-CM

## 2023-05-16 MED ORDER — NURTEC 75 MG PO TBDP: ORAL_TABLET | ORAL | 11 refills | Status: AC

## 2023-05-16 MED ORDER — GABAPENTIN 100 MG PO CAPS: 300.0000 mg | ORAL_CAPSULE | Freq: Every day | ORAL | 3 refills | Status: DC

## 2023-05-16 MED ORDER — EMGALITY 120 MG/ML ~~LOC~~ SOAJ: SUBCUTANEOUS | 4 refills | Status: DC

## 2023-05-16 NOTE — Patient Instructions (Signed)
Your Plan:  Continue Emgality monthly injection  Continue Nurtec for abortive therapy  Continue gabapentin 300 mg at bedtime    Thank you for coming to see [name] at [facility]. I hope we have been able to provide you high quality care today.  You may receive a patient satisfaction survey over the next few weeks. We would appreciate your feedback and comments so that we may continue to improve ourselves and the health of our patients.

## 2023-05-16 NOTE — Progress Notes (Signed)
PATIENT: Jill Harper DOB: [birth year]  REASON FOR VISIT: follow up HISTORY FROM: patient  Virtual Visit via Video Note  I connected with Jill Harper on 05/16/23 at  1:30 PM EDT by a video enabled telemedicine application located remotely at [facility] and verified that I am speaking with the correct person using two identifiers who was located at their own home.   I discussed the limitations of evaluation and management by telemedicine and the availability of in person appointments. The patient expressed understanding and agreed to proceed.    HISTORY OF PRESENT ILLNESS: Today 05/16/23:  Jill Harper is a 57 y.o. female with a history of Migraine headaches and FMD. Returns today for follow-up.  She reports overall her migraines have increased in the last month.  She feels that this may be due to cooler weather.  She is still happy with her current medication regimen.  She continues on Emgality monthly, gabapentin 300 mg at bedtime and Nurtec for abortive therapy.  She states that Nurtec works well she just has to get better about taking it as soon as she gets a headache.   11/01/22: Jill Harper is a 57 y.o. female with a history of migraine headaches and fibromuscular dysplasia. Returns today for follow-up.  Feels that Emgality is working well. Having issues getting nurtec- pharmacy is telling her only 10 tablets allowed in a year. On average about 3 migraines a month. Weather triggered. Get aura that consistent of tingling on the left side of face, difficulty process words. Has this happen about 4 times a month but no headache pain comes. Each one of her migraines (with pain) starts with this aura. Also having vertigo- went to vestibular rehab- only had 1 episode since then.   Gabapentin has been helpful for the sudden onset of stabbing headache. Has not had an episode where is awakes from sleep in pain.    05/01/22: Jill Harper is a 57 year old female with a history  of migraine headaches and fibromuscular dysplasia.  She returns today for follow-up.  She feels that Ronnell [place] is working well although she states in the last few months she is having 6-8 headaches a month. The headache may last 2-3 days. Reports approximately 12-15 headache days a month. Uses nurtec for a migraine. She has an actual migraine about 5 times a month.  She states that she does not always take Nurtec at the onset of a migraine.  Sometimes she waits to see if the headache will get more severe.  She was also concerned about overusing the medication.  She returns today for an evaluation  08/14/21: Jill Harper is a 57 year old female with a history of migraine headaches.  She returns today for follow-up.  She states that her migraines have been under relatively good control.  She states is very rare for her to have 1-2 headaches a month.  Emgality has been lasting at least 4 weeks.  Relpax and Nurtec to offer her benefit if she has to take them.  Patient was getting stabbing headaches during the night.  She states that these episodes have also reduced.  She states on occasion she will have a mild headache later in the evening.  This past Friday and Saturday she did wake up early morning with a stabbing headache.  She did use Relpax with good benefit.  She is inquiring if gabapentin can be increased.  Patient has FMD.  She states that in a support group she  is part of imaging a full body scan to identify any other areas that has been affected by FMD.  She is curious if this needs to be completed.  She has an appointment next month with Dr. Jearld Fenton.  02/07/21: Jill Harper is a 57 year old female with a history of migraine headaches.  She returns today for follow-up.  She reports that she has approximately 2 migraines a week.  She typically can take Nurtec or Relpax and the headache resolves fairly quickly.  She uses Ajovy for preventative therapy however this is not covered through her insurance she has been  using a co-pay savings card.  She would like to switch to [employer] which is covered through her insurance.  She states that taking gabapentin at bedtime has reduced the severity and frequency of the stabbing headaches that she was receiving.  She does report that she often gets them during the day as well.  At least 2 times a week.  She states that it only lasts for several minutes and afterwards she may feel a little foggy in her head will be tender but that resolves and she is able to carry on with her day.  She does use oxygen at night.  She returns today for an evaluation.  11/03/20: Jill Harper is a 57 year old female with a history of migraine headaches.  She is currently taking Ajovy every 3 weeks.  She has Relpax and Nurtec that she rotates nightly.  Reports that she has sharp shooting pains in the left parietal region that radiates to the temporal region.  This only occurs at night usually wakes her up from sleep.  She has had this type of discomfort before however it has become more frequent recently.  She states that it only lasts for several seconds to minutes before it resolves.  Patient states that she began taking Nurtec every other night and reports that when she took Nurtec she did not wake up with this discomfort.  She states that she did not added Relpax and will take it on the nights that she did not take Nurtec and this also eliminated the discomfort at night. the patient wears a CPAP machine with supplemental oxygen at night managed by Dr. Craige Cotta.  She states during the day she may have 3 to 4 days a week that she has a mild headache.  She states that the severity has definitely improved.  She typically does not take anything for these headaches.  She reports that sometimes the headaches can last all day long.  She does feel that Ajovy has improved the severity of her headaches.    HISTORY ,(copied from Dr. Trevor Mace note)  When she has a headache/migraine the relpax works. She may have to take  2 doses. Sometimes tylenol will work alone. She has weired feeling and stabbing pain sometimes and lasts a few minutes in the left parietal area. She feels the Ajovy is not as effective, wears out, we discussed changing to Emgality or possibly taking Ajovy q3weeks, gave her sampes, she will try Ajovt q3weeks. Try Nurtec.      Interval history September 23, 2019: Patient here for follow-up, she was last seen in December for hand pain and EMG nerve conduction study did show carpal tunnel syndrome.  PMHx vitamin B12 deficiency, sleep apnea, rheumatoid arthritis, pulmonary fibrosis, polymyositis, osteoarthritis, migraines, joint pain, interstitial lung disease, fibromyalgia, fibromuscular dysplasia, bronchiectasis and abnormal liver function test. She saw Dr. Randie Heinz and vein in vascular for discoloration of her skin,  she gets blue tips of fingers and nails, diagnosed with raynaud's phenomenon related to her RA, no vascular intervention, reviewed his notes and discussed with patient as above. She saw Dr. Christella Noa for the CTS and they are going to monitor, the symptoms have subsided and her symptoms were slight atypical for CTS on the right, she had ulnar neuropathy on the left and she noticed she did have symptoms and she was given prednisone which helped but it still some and goes, Ajovy working for migraines not daily headaches, she can have an occassional headache and it generally subsides on its own occipital on the left feels hot.    Interval history 07/20/2019: Patient here for a new chief complaint as requested by Dr. Corliss Skains for hand pain. PMHx vitamin B12 deficiency, sleep apnea, rheumatoid arthritis, pulmonary fibrosis, polymyositis, osteoarthritis, migraines, joint pain, interstitial lung disease, fibromyalgia, fibromuscular dysplasia, bronchiectasis and abnormal liver function test.  In August or September, she had hand pain. Her hands felt cool, right > left, she started getting cramping in the right hand,  not significantly painful just cramped, difficult time straightening it, massage helped. A month later it happened again, more painful, and she could see it "drawing", started getting colder to the touch, she woke up with excruciating pain in the hand and forearm ventrally and she was cramped again. She took 2 alleve. Severe, some numbness, pain felt like tightness, severe and visibly "drawing" up. Massaging helped finger by finger. MRI cervical spine was normal.  She has weakness in her hands. Progressively worsening. Pain in both shoulders. Some numbness in the first 4 digits. No prior diagnosis of CTS. No injury. She also felt a big knot on the inside of her wrist. Like a muscle spasm. No other focal neurologic deficits, associated symptoms, inciting events or modifiable factors.     Personally reviewed MRI cervical spine 07/08/2019: Normal   Cbc/cmp normal 03/2019   Interval history 06/08/2019: Carotid arteries on CTA c/w FMD. Discussed Fibromuscular Dysplasia, sent to Dr. Corliss Skains and being followed there. She has sharp pain in the back of the head, stops her because it is so severe, so painful it stops her in her tracks. Leaves her weak afterwards. Can go weeks without it. It is random, unknown what triggers, brief, severe. Also continues to have migraines, left sided, pulsating, pounding, contsant pain, behind the eye, feels she may get ptosis, no lacrimation or rhinorrhea. She has light and sound sensitivity. She has daily headaches, no medication overuse, no aura. She has 8 migraine days a month with moderate or severe quality and can last 24-72 hours ongoing for > year at this frequency and severity and quality. For FMD and BO meds and a daily asa. Tried atenolol, relpax, telmisartan, verapamil, nortriptyline, topamax. She has some brain fog, discussed normal aging, chronic pain and lifestyle. She has had neck pain, she has numbness and tingling in the left hand and arm, ongoing for >  Months, failed  conservative measures, under the care of a physician.     HPI:  Jill Harper is a 57 y.o. female here as requested by Dr. Lawerance Bach for headaches. PMHx RA, bronchiectasis, polymyositis on imuran follows with Rheumatology, never smoked, interstitial lung disease, fibromyalgia, migraines, mitral valve prolapse, abnormal liver function test. She was supposed to have a sleep study but did not complete that (per notes Elisha Headland).  She reports she has headaches that come on during exertion and dissipate without medication when she slows down or alters activities.  These are different from her usual migraines. She just had sleep test a few weeks ago, snoring, stops breathing. She does not have the results. She wakes up at night feeling like she can't breathe. She wakes with with "new" headache in the setting of sleep problems for at least 5-6 months. She may have the same headache later in the day. The headache is a fogginedd across  the forehead and more on the left.She wakes with this headache in the setting if snoring and lung problems. The headache is worse with exertion, walking and talking, worse with valsalva and also with sex/orgasm. Rest helps and sitting still and it dissipates slowly. Can be moderately severe. Here with husband who provides much information.   Headache medications used: Atenolol, Flexeril, Relpax,   Reviewed notes, labs and imaging from outside physicians, which showed:   Personally reviewed images from 2013 and agree with the following    Reviewed Lorrene Reid notes.  Last time she was seen was 07/28/2018.  Patient has a history of rheumatoid arthritis, polymyositis followed in rheumatology.  Also bronchiectasis.  She was supposed to have a sleep study that was previously ordered but she did not.  Epworth Sleepiness Scale 7 06/16/2018.   ANA negative, rheumatoid factor XX 9, anti-CCP 213, SCL 70, SSA SSB negative, Jo 1+.  Patient's last echocardiogram 05/29/2017 with ejection fraction  60 to 65%, moderate left ventricular hypertrophy, grade 1 DD.  CT of the sinuses were negative for acute disease.    Called Dr. Evlyn Courier office and requested results of sleep testing    REVIEW OF SYSTEMS: Out of a complete 14 system review of symptoms, the patient complains only of the following symptoms, and all other reviewed systems are negative.  ALLERGIES: Allergies  Allergen Reactions   Sulfonamide Derivatives     REACTION: hives    HOME MEDICATIONS: Outpatient Medications Prior to Visit  Medication Sig Dispense Refill   albuterol (PROVENTIL) (2.5 MG/3ML) 0.083% nebulizer solution Take 3 mLs (2.5 mg total) by nebulization every 6 (six) hours as needed for wheezing or shortness of breath. 120 vial 5   albuterol (VENTOLIN HFA) 108 (90 Base) MCG/ACT inhaler INHALE 2 PUFFS BY MOUTH INTO THE LUNGS FOUR TIMES DAILY AS NEEDED 8.5 g 1   alendronate (FOSAMAX) 70 MG tablet Take 1 tablet by mouth once a week.     aspirin EC 81 MG tablet Take 81 mg by mouth daily.     atenolol (TENORMIN) 25 MG tablet TAKE 1 TABLET(25 MG) BY MOUTH DAILY 90 tablet 2   azaTHIOprine (IMURAN) 50 MG tablet TAKE 2 TABLETS BY MOUTH DAILY 180 tablet 0   Cholecalciferol (VITAMIN D) 50 MCG (2000 UT) CAPS Take by mouth.     diclofenac Sodium (VOLTAREN) 1 % GEL APPLY 2 TO 4 GRAMS TOPICALLY TO AFFECTED JOINT FOUR TIMES DAILY AS NEEDED 400 g 2   DULoxetine (CYMBALTA) 30 MG capsule TAKE ONE CAPSULE BY MOUTH DAILY FOR TOAL OF 90 MG DAILY 30 capsule 3   DULoxetine (CYMBALTA) 60 MG capsule TAKE 1 CAPSULE(60 MG) BY MOUTH DAILY 90 capsule 1   ENBREL MINI 50 MG/ML injection INSERT 1 MINI CARTRIDGE INTO AUTOINJECTOR AND INJECT 50 MG UNDER THE SKIN EVERY 7 DAYS 12 mL 0   gabapentin (NEURONTIN) 100 MG capsule TAKE 3 CAPSULES(300 MG) BY MOUTH AT BEDTIME 270 capsule 0   Galcanezumab-gnlm (EMGALITY) 120 MG/ML SOAJ ADMINISTER 1 ML UNDER THE SKIN EVERY 30 DAYS 3 mL 0   guaiFENesin (MUCINEX) 600 MG  12 hr tablet Take 2 tablets (1,200 mg  total) by mouth 2 (two) times daily as needed for cough or to loosen phlegm. (Patient taking differently: Take 1,200 mg by mouth 2 (two) times daily.)     meclizine (ANTIVERT) 25 MG tablet Take 1 tablet (25 mg total) by mouth 3 (three) times daily as needed for dizziness. 30 tablet 0   metFORMIN (GLUCOPHAGE) 500 MG tablet TAKE 1 TABLET(500 MG) BY MOUTH TWICE DAILY WITH A MEAL 180 tablet 1   methocarbamol (ROBAXIN) 500 MG tablet Take 1 tablet (500 mg total) by mouth every 8 (eight) hours as needed. 60 tablet 3   Multiple Vitamins-Minerals (MULTIVITAMIN GUMMIES ADULT PO) Take 2 each by mouth daily.     Rimegepant Sulfate (NURTEC) 75 MG TBDP DISSOLVE ONE TABLET BY MOUTH DAILY AS NEEDED FOR MIGRAINES. TAKE AS CLOSE TO ONSET OF MIGRAINE AS POSSIBLE. 1 TABLET DAILY MAXIMUM. 15 tablet 11   sodium chloride HYPERTONIC 3 % nebulizer solution Take by nebulization as needed for other. 750 mL 12   telmisartan (MICARDIS) 20 MG tablet TAKE 1 TABLET(20 MG) BY MOUTH DAILY 90 tablet 1   TRELEGY ELLIPTA 100-62.5-25 MCG/ACT AEPB INHALE 1 PUFF INTO THE LUNGS DAILY 60 each 5   No facility-administered medications prior to visit.    PAST MEDICAL HISTORY: Past Medical History:  Diagnosis Date   Abnormal liver function test    Bronchiectasis        Dyspnea    Fibromuscular dysplasia (HCC)    Fibromyalgia    Heart murmur    ILD (interstitial lung disease) (HCC)    Joint pain    Menorrhagia    MIGRAINE HEADACHE    MVP (mitral valve prolapse)    Osteoarthritis    Polymyositis (HCC)    Dr [name]; chronic MTX   Pulmonary fibrosis (HCC)    Rheumatoid arthritis (HCC)    Sleep apnea    Vertigo    Vitamin B 12 deficiency    Vitamin D deficiency     PAST SURGICAL HISTORY: Past Surgical History:  Procedure Laterality Date   ablation uterine  2010   CARDIAC CATHETERIZATION  2002   normal   DILATION AND CURETTAGE OF UTERUS  08-31-08   Dr Zelphia Cairo   IR ANGIO INTRA EXTRACRAN SEL COM CAROTID  INNOMINATE BILAT MOD SED  09/18/2018   IR ANGIO VERTEBRAL SEL VERTEBRAL BILAT MOD SED  09/18/2018   IR US GUIDE VASC ACCESS RIGHT  09/18/2018    FAMILY HISTORY: Family History  Problem Relation Age of Onset   Diabetes Mother    Fibromyalgia Mother    Ulcers Mother    Heart failure Mother    Thyroid disease Mother    Obesity Mother    Multiple myeloma Father    Hypertension Father    Stroke Father    Lupus Sister    Other Sister        abdominal adhesions resulting in bowel obstruction   Migraines Sister    Heart disease Brother        bypass surgery   Rheum arthritis Sister    Multiple myeloma Sister    Hypertension Sister    Heart attack Sister    Diabetes Sister    Hypertension Sister    Rheum arthritis Sister    Diabetes Sister    Diabetes Brother    Headache Other        siblings with headaches but not diagnosed as migraines    SOCIAL HISTORY:  Social History   Socioeconomic History   Marital status: Married    Spouse name: Daysy Santini   Number of children: 1   Years of education: Not on file   Highest education level: 12th grade  Occupational History   Occupation: Compliant and Injury Clerk  Tobacco Use   Smoking status: Never    Passive exposure: Never   Smokeless tobacco: Never   Tobacco comments:    Married, lives with spouse. 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[employer]  Vaping Use   Vaping status: Never Used  Substance and Sexual Activity   Alcohol use: Not Currently    Comment: maybe a wine cooler once every other year   Drug use: Never   Sexual activity: Yes    Birth control/protection: Surgical  Other Topics Concern   Not on file  Social History Narrative   Exercise: trying to walk - limited by fatigue   Lives at home with husband    Right handed   Caffeine: none    Social Determinants of Health   Financial Resource Strain: Low Risk  (11/14/2022)   Overall Financial Resource Strain (CARDIA)    Difficulty of Paying  Living Expenses: Not very hard  Food Insecurity: No Food Insecurity (11/14/2022)   Hunger Vital Sign    Worried About Running Out of Food in the Last Year: Never true    Ran Out of Food in the Last Year: Never true  Transportation Needs: No Transportation Needs (11/14/2022)   PRAPARE - Administrator, Civil Service (Medical): No    Lack of Transportation (Non-Medical): No  Physical Activity: Unknown (11/14/2022)   Exercise Vital Sign    Days of Exercise per Week: 0 days    Minutes of Exercise per Session: Not on file  Stress: No Stress Concern Present (11/14/2022)   [employer] of Occupational Health - Occupational Stress Questionnaire    Feeling of Stress : Only a little  Social Connections: Socially Integrated (11/14/2022)   Social Connection and Isolation Panel [NHANES]    Frequency of Communication with Friends and Family: More than three times a week    Frequency of Social Gatherings with Friends and Family: Patient declined    Attends Religious Services: More than 4 times per year    Active Member of [employer] or Organizations: Yes    Attends Engineer, structural: More than 4 times per year    Marital Status: Married  Catering manager Violence: Not on file      PHYSICAL EXAM    Generalized: Well developed, in no acute distress   Neurological examination  Mentation: Alert oriented to time, place, history taking. Follows all commands speech and language fluent Cranial nerve II-XII: Facial symmetry noted   DIAGNOSTIC DATA (LABS, IMAGING, TESTING) - I reviewed patient records, labs, notes, testing and imaging myself where available.  Lab Results  Component Value Date   WBC 6.7 02/26/2023   HGB 12.4 02/26/2023   HCT 37.8 02/26/2023   MCV 84.4 02/26/2023   PLT 257 02/26/2023      Component Value Date/Time   NA 140 02/26/2023 1144   NA 141 10/12/2019 1432   K 4.7 02/26/2023 1144   CL 108 02/26/2023 1144   CO2 27 02/26/2023 1144   GLUCOSE 108  (H) 02/26/2023 1144   BUN 8 02/26/2023 1144   BUN 11 10/12/2019 1432   CREATININE 0.63 02/26/2023 1144   CALCIUM 9.3 02/26/2023 1144   PROT  7.0 02/26/2023 1144   PROT 7.7 10/12/2019 1432   ALBUMIN 4.3 09/18/2022 1615   ALBUMIN 4.6 10/12/2019 1432   AST 16 02/26/2023 1144   ALT 14 02/26/2023 1144   ALKPHOS 73 09/18/2022 1615   BILITOT 0.5 02/26/2023 1144   BILITOT 0.3 10/12/2019 1432   GFRNONAA 98 01/18/2021 1626   GFRAA 114 01/18/2021 1626   Lab Results  Component Value Date   CHOL 123 09/18/2022   HDL 38.90 (L) 09/18/2022   LDLCALC 67 09/18/2022   TRIG 88.0 09/18/2022   CHOLHDL 3 09/18/2022   Lab Results  Component Value Date   HGBA1C 5.9 09/18/2022   Lab Results  Component Value Date   VITAMINB12 921 (H) 07/11/2021   Lab Results  Component Value Date   TSH 4.32 09/18/2022      ASSESSMENT AND PLAN 57 y.o. year old female  has a past medical history of Abnormal liver function test, Bronchiectasis, Dyspnea, Fibromuscular dysplasia (HCC), Fibromyalgia, Heart murmur, ILD (interstitial lung disease) (HCC), Joint pain, Menorrhagia, MIGRAINE HEADACHE, MVP (mitral valve prolapse), Osteoarthritis, Polymyositis (HCC), Pulmonary fibrosis (HCC), Rheumatoid arthritis (HCC), Sleep apnea, Vertigo, Vitamin B 12 deficiency, and Vitamin D deficiency. here with:  1.  Migraine headache   --Continue Emgality 120 mg monthly injection --Continue Nurtec for abortive therapy.will call pharmacy to verify -- We discussed potentially changing her medications however she is happy with her current regimen. -- Not a candidate for triptans due to diagnosis of FMD  2.  Stabbing headache  -- Continue gabapentin to 300 mg at bedtime   Follow-up in 6 months or sooner if needed   Butch Penny, MSN, NP-C 05/16/2023, 1:37 PM [facility] [address], [address] [place], [place] [address] [phone]

## 2023-05-23 DIAGNOSIS — F4310 Post-traumatic stress disorder, unspecified: Secondary | ICD-10-CM | POA: Diagnosis not present

## 2023-05-26 ENCOUNTER — Other Ambulatory Visit: Payer: Self-pay | Admitting: Internal Medicine

## 2023-05-27 DIAGNOSIS — I773 Arterial fibromuscular dysplasia: Secondary | ICD-10-CM | POA: Diagnosis not present

## 2023-05-27 DIAGNOSIS — I10 Essential (primary) hypertension: Secondary | ICD-10-CM | POA: Diagnosis not present

## 2023-05-29 ENCOUNTER — Other Ambulatory Visit: Payer: Self-pay | Admitting: Nurse Practitioner

## 2023-05-29 DIAGNOSIS — J471 Bronchiectasis with (acute) exacerbation: Secondary | ICD-10-CM | POA: Diagnosis not present

## 2023-05-29 DIAGNOSIS — G4733 Obstructive sleep apnea (adult) (pediatric): Secondary | ICD-10-CM | POA: Diagnosis not present

## 2023-05-29 DIAGNOSIS — J849 Interstitial pulmonary disease, unspecified: Secondary | ICD-10-CM

## 2023-05-30 NOTE — Progress Notes (Signed)
Subjective:    Patient ID: ANAYAH CHANDLER, female    DOB: [birth year], 57 y.o.   MRN: [id number]      HPI Ersie is here for  Chief Complaint  Patient presents with   Medical Management of Chronic Issues    Thyroid labs from gyn -   tsh 5.030.  Denies any new symptoms hypothyroidism.   She was placed on Fosamax for osteoporosis.  Coughing has improved-has taken a long time to improve, but she did not need any further treatment.  Saw Dr. Selena Batten earlier this week for her FMD.   Medications and allergies reviewed with patient and updated if appropriate.  Current Outpatient Medications on File Prior to Visit  Medication Sig Dispense Refill   albuterol (PROVENTIL) (2.5 MG/3ML) 0.083% nebulizer solution Take 3 mLs (2.5 mg total) by nebulization every 6 (six) hours as needed for wheezing or shortness of breath. 120 vial 5   albuterol (VENTOLIN HFA) 108 (90 Base) MCG/ACT inhaler INHALE 2 PUFFS BY MOUTH INTO THE LUNGS FOUR TIMES DAILY AS NEEDED 8.5 g 1   alendronate (FOSAMAX) 70 MG tablet Take 1 tablet by mouth once a week.     aspirin EC 81 MG tablet Take 81 mg by mouth daily.     atenolol (TENORMIN) 25 MG tablet TAKE 1 TABLET(25 MG) BY MOUTH DAILY 90 tablet 2   azaTHIOprine (IMURAN) 50 MG tablet TAKE 2 TABLETS BY MOUTH DAILY 180 tablet 0   Cholecalciferol (VITAMIN D) 50 MCG (2000 UT) CAPS Take by mouth.     diclofenac Sodium (VOLTAREN) 1 % GEL APPLY 2 TO 4 GRAMS TOPICALLY TO AFFECTED JOINT FOUR TIMES DAILY AS NEEDED 400 g 2   DULoxetine (CYMBALTA) 30 MG capsule TAKE ONE CAPSULE BY MOUTH DAILY FOR TOAL OF 90 MG DAILY 30 capsule 3   DULoxetine (CYMBALTA) 60 MG capsule TAKE 1 CAPSULE(60 MG) BY MOUTH DAILY 90 capsule 1   ENBREL MINI 50 MG/ML injection INSERT 1 MINI CARTRIDGE INTO AUTOINJECTOR AND INJECT 50 MG UNDER THE SKIN EVERY 7 DAYS 12 mL 0   gabapentin (NEURONTIN) 100 MG capsule Take 3 capsules (300 mg total) by mouth at bedtime. TAKE 3 CAPSULES(300 MG) BY MOUTH AT BEDTIME 270 capsule 3    Galcanezumab-gnlm (EMGALITY) 120 MG/ML SOAJ ADMINISTER 1 ML UNDER THE SKIN EVERY 30 DAYS 3 mL 4   guaiFENesin (MUCINEX) 600 MG 12 hr tablet Take 2 tablets (1,200 mg total) by mouth 2 (two) times daily as needed for cough or to loosen phlegm. (Patient taking differently: Take 1,200 mg by mouth 2 (two) times daily.)     meclizine (ANTIVERT) 25 MG tablet Take 1 tablet (25 mg total) by mouth 3 (three) times daily as needed for dizziness. 30 tablet 0   metFORMIN (GLUCOPHAGE) 500 MG tablet TAKE 1 TABLET(500 MG) BY MOUTH TWICE DAILY WITH A MEAL 180 tablet 1   methocarbamol (ROBAXIN) 500 MG tablet Take 1 tablet (500 mg total) by mouth every 8 (eight) hours as needed. 60 tablet 3   Multiple Vitamins-Minerals (MULTIVITAMIN GUMMIES ADULT PO) Take 2 each by mouth daily.     Rimegepant Sulfate (NURTEC) 75 MG TBDP DISSOLVE ONE TABLET BY MOUTH DAILY AS NEEDED FOR MIGRAINES. TAKE AS CLOSE TO ONSET OF MIGRAINE AS POSSIBLE. 1 TABLET DAILY MAXIMUM. 15 tablet 11   sodium chloride HYPERTONIC 3 % nebulizer solution Take by nebulization as needed for other. 750 mL 12   telmisartan (MICARDIS) 20 MG tablet TAKE 1 TABLET(20 MG) BY MOUTH  DAILY 90 tablet 1   TRELEGY ELLIPTA 100-62.5-25 MCG/ACT AEPB INHALE 1 PUFF INTO THE LUNGS DAILY 60 each 5   No current facility-administered medications on file prior to visit.    Review of Systems     Objective:   Vitals:   05/31/23 1435  BP: 116/68  Pulse: 68  Temp: 98 F (36.7 C)  SpO2: 97%   BP Readings from Last 3 Encounters:  05/31/23 116/68  05/15/23 130/84  03/20/23 117/71   Wt Readings from Last 3 Encounters:  05/31/23 (!) 306 lb (138.8 kg)  05/15/23 (!) 302 lb (137 kg)  03/20/23 (!) 301 lb (136.5 kg)   Body mass index is 47.22 kg/m.    Physical Exam Constitutional:      General: She is not in acute distress.    Appearance: Normal appearance.  HENT:     Head: Normocephalic and atraumatic.  Eyes:     Conjunctiva/sclera: Conjunctivae normal.   Cardiovascular:     Rate and Rhythm: Normal rate and regular rhythm.     Heart sounds: Normal heart sounds.  Pulmonary:     Effort: Pulmonary effort is normal. No respiratory distress.     Breath sounds: Normal breath sounds. No wheezing.  Musculoskeletal:     Right lower leg: No edema.     Left lower leg: No edema.  Skin:    General: Skin is warm and dry.     Findings: No rash.  Neurological:     Mental Status: She is alert. Mental status is at baseline.  Psychiatric:        Mood and Affect: Mood normal.        Behavior: Behavior normal.            Assessment & Plan:    See Problem List for Assessment and Plan of chronic medical problems.

## 2023-05-30 NOTE — Patient Instructions (Addendum)
     Flu immunization administered today.       Medications changes include :   none      Return in about 3 months (around 08/31/2023) for follow up.

## 2023-05-31 ENCOUNTER — Ambulatory Visit: Payer: Federal, State, Local not specified - PPO | Admitting: Internal Medicine

## 2023-05-31 ENCOUNTER — Encounter: Payer: Self-pay | Admitting: Internal Medicine

## 2023-05-31 VITALS — BP 116/68 | HR 68 | Temp 98.0°F | Ht 67.5 in | Wt 306.0 lb

## 2023-05-31 DIAGNOSIS — I10 Essential (primary) hypertension: Secondary | ICD-10-CM | POA: Diagnosis not present

## 2023-05-31 DIAGNOSIS — E038 Other specified hypothyroidism: Secondary | ICD-10-CM | POA: Diagnosis not present

## 2023-05-31 DIAGNOSIS — Z23 Encounter for immunization: Secondary | ICD-10-CM | POA: Diagnosis not present

## 2023-05-31 NOTE — Assessment & Plan Note (Signed)
Chronic Elevated TSH, normal free T4, free T3 and thyroid antibodies Last TSH here was in the normal range in February 2024 Recent TSH in GYN was slightly elevated, but would have been normal at our lab At this point she is asymptomatic so we will continue to monitor If she becomes symptomatic or continues to have subclinical hypothyroidism we will consider starting levothyroxine She does have clinical osteoporosis Recheck TFTs in 3 months

## 2023-05-31 NOTE — Assessment & Plan Note (Signed)
Chronic Blood pressure well-controlled Continue current medications-atenolol 25 mg daily, telmisartan 20 mg daily 

## 2023-06-03 ENCOUNTER — Telehealth: Payer: Self-pay | Admitting: Nurse Practitioner

## 2023-06-04 DIAGNOSIS — F4310 Post-traumatic stress disorder, unspecified: Secondary | ICD-10-CM | POA: Diagnosis not present

## 2023-06-08 NOTE — Telephone Encounter (Signed)
Routing to Katie to be on the lookout for the placard that was dropped off.

## 2023-06-11 ENCOUNTER — Other Ambulatory Visit: Payer: Self-pay | Admitting: Physician Assistant

## 2023-06-11 DIAGNOSIS — M0579 Rheumatoid arthritis with rheumatoid factor of multiple sites without organ or systems involvement: Secondary | ICD-10-CM

## 2023-06-11 DIAGNOSIS — F4310 Post-traumatic stress disorder, unspecified: Secondary | ICD-10-CM | POA: Diagnosis not present

## 2023-06-11 NOTE — Telephone Encounter (Signed)
Last Fill: 03/19/2023  Labs: 02/26/2023 CBC and CMP are normal.   TB Gold: 02/26/2023 Neg    Next Visit: 06/25/2023  Last Visit: 03/20/2023  [id number]   Current Dose per office note 03/20/2023: Enbrel 50 mg sq injections once weekly   Patient to update labs at upcoming appointment on 06/25/2023  Okay to refill Enbrel?

## 2023-06-11 NOTE — Progress Notes (Deleted)
Office Visit Note  Patient: Jill Harper             Date of Birth: [birth year]           MRN: [id number]             PCP: Pincus Sanes, MD Referring: Pincus Sanes, MD Visit Date: 06/25/2023 Occupation: @GUAROCC @  Subjective:  No chief complaint on file.   History of Present Illness: Jill Harper is a 57 y.o. female ***     Activities of Daily Living:  Patient reports morning stiffness for *** {minute/hour:19697}.   Patient {ACTIONS;DENIES/REPORTS:21021675::"Denies"} nocturnal pain.  Difficulty dressing/grooming: {ACTIONS;DENIES/REPORTS:21021675::"Denies"} Difficulty climbing stairs: {ACTIONS;DENIES/REPORTS:21021675::"Denies"} Difficulty getting out of chair: {ACTIONS;DENIES/REPORTS:21021675::"Denies"} Difficulty using hands for taps, buttons, cutlery, and/or writing: {ACTIONS;DENIES/REPORTS:21021675::"Denies"}  No Rheumatology ROS completed.   PMFS History:  Patient Active Problem List   Diagnosis Date Noted   Subclinical hypothyroidism 05/31/2023   Rib pain on left side 01/08/2023   Back pain 11/21/2022   Left arm pain 09/13/2022   Left arm swelling 09/13/2022   Right upper quadrant pain 08/15/2022   Palpitations-related to MVP 04/02/2022   Nocturnal hypoxia 01/16/2022   Fibromuscular dysplasia of wall of intracranial artery (HCC) 12/20/2021   Anxiety 09/06/2021   Tingling 06/28/2021   Occipital neuralgia of left side 01/06/2020   Metabolic syndrome X 07/28/2019   Entrapment of left ulnar nerve 07/27/2019   Right hand pain 07/27/2019   Bilateral carpal tunnel syndrome 07/21/2019   Costochondritis 03/16/2019   Rib pain on right side 03/16/2019   Prediabetes 11/27/2018   Fibromuscular dysplasia (HCC) 10/17/2018   Hypertension 10/17/2018   Bronchiectasis with (acute) exacerbation (HCC) 10/08/2018   OSA (obstructive sleep apnea) 08/20/2018   Rheumatoid arthritis (HCC) - Dr Corliss Skains 04/17/2018   ILD (interstitial lung disease) (HCC) 04/17/2018   Dyspnea  04/01/2018   Bronchiectasis (HCC) 09/02/2017   LVH (left ventricular hypertrophy), mild 06/02/2017   Mitral regurgitation 05/22/2017   Jo-1 antibody positive 01/29/2017   Primary osteoarthritis of both feet 01/29/2017   Primary osteoarthritis of both hands 01/29/2017   Left shoulder tendonitis 01/17/2017   Vitamin D deficiency 04/26/2016   Cough 07/18/2015   URI (upper respiratory infection) 07/12/2015   MVP (mitral valve prolapse)    Erythema nodosum    INCONTINENCE, URGE 05/26/2010   Migraine headache 02/15/2010   Fibromyalgia 06/20/2009   Vertigo 03/05/2008   Polymyositis (HCC) 02/11/2008   BRONCHIECTASIS 02/10/2008    Past Medical History:  Diagnosis Date   Abnormal liver function test    Bronchiectasis        Dyspnea    Fibromuscular dysplasia (HCC)    Fibromyalgia    Heart murmur    ILD (interstitial lung disease) (HCC)    Joint pain    Menorrhagia    MIGRAINE HEADACHE    MVP (mitral valve prolapse)    Osteoarthritis    Polymyositis (HCC)    Dr [name]; chronic MTX   Pulmonary fibrosis (HCC)    Rheumatoid arthritis (HCC)    Sleep apnea    Vertigo    Vitamin B 12 deficiency    Vitamin D deficiency     Family History  Problem Relation Age of Onset   Diabetes Mother    Fibromyalgia Mother    Ulcers Mother    Heart failure Mother    Thyroid disease Mother    Obesity Mother    Multiple myeloma Father    Hypertension Father    Stroke Father  Lupus Sister    Other Sister        abdominal adhesions resulting in bowel obstruction   Migraines Sister    Heart disease Brother        bypass surgery   Rheum arthritis Sister    Multiple myeloma Sister    Hypertension Sister    Heart attack Sister    Diabetes Sister    Hypertension Sister    Rheum arthritis Sister    Diabetes Sister    Diabetes Brother    Headache Other        siblings with headaches but not diagnosed as migraines   Past Surgical History:  Procedure Laterality Date   ablation  uterine  2010   CARDIAC CATHETERIZATION  2002   normal   DILATION AND CURETTAGE OF UTERUS  08-31-08   Dr Zelphia [place]   IR ANGIO INTRA EXTRACRAN SEL COM CAROTID INNOMINATE BILAT MOD SED  09/18/2018   IR ANGIO VERTEBRAL SEL VERTEBRAL BILAT MOD SED  09/18/2018   IR US GUIDE VASC ACCESS RIGHT  09/18/2018   Social History   Social History Narrative   Exercise: trying to walk - limited by fatigue   Lives at home with husband    Right handed   Caffeine: none    Immunization History  Administered Date(s) Administered   Influenza Split 04/10/2011, 06/18/2012, 05/06/2013   Influenza Whole 05/19/2008, 05/15/2009, 06/01/2010   Influenza, Seasonal, Injecte, Preservative Fre 05/31/2023   Influenza,inj,Quad PF,6+ Mos 04/20/2014, 05/26/2015, 05/31/2016, 04/05/2017, 06/16/2018, 04/22/2019, 04/22/2020   Influenza-Unspecified 04/11/2021, 04/02/2022   PFIZER(Purple Top)SARS-COV-2 Vaccination 10/27/2019, 11/19/2019   Pneumococcal Conjugate-13 01/29/2017   Pneumococcal Polysaccharide-23 08/07/2007, 06/16/2018   Td 07/12/2009   Tdap 01/06/2020   Unspecified SARS-COV-2 Vaccination 10/27/2019, 11/19/2019, 04/04/2021   Zoster, Live 06/06/2022     Objective: Vital Signs: There were no vitals taken for this visit.   Physical Exam   Musculoskeletal Exam: ***  CDAI Exam: CDAI Score: -- Patient Global: --; Provider Global: -- Swollen: --; Tender: -- Joint Exam 06/25/2023   No joint exam has been documented for this visit   There is currently no information documented on the homunculus. Go to the Rheumatology activity and complete the homunculus joint exam.  Investigation: No additional findings.  Imaging: No results found.  Recent Labs: Lab Results  Component Value Date   WBC 6.7 02/26/2023   HGB 12.4 02/26/2023   PLT 257 02/26/2023   NA 140 02/26/2023   K 4.7 02/26/2023   CL 108 02/26/2023   CO2 27 02/26/2023   GLUCOSE 108 (H) 02/26/2023   BUN 8 02/26/2023   CREATININE 0.63  02/26/2023   BILITOT 0.5 02/26/2023   ALKPHOS 73 09/18/2022   AST 16 02/26/2023   ALT 14 02/26/2023   PROT 7.0 02/26/2023   ALBUMIN 4.3 09/18/2022   CALCIUM 9.3 02/26/2023   GFRAA 114 01/18/2021   QFTBGOLDPLUS NEGATIVE 02/26/2023    Speciality Comments: Prior therapy includes: MTX (d/c due to ILD progresssion)  Procedures:  No procedures performed Allergies: Sulfonamide derivatives   Assessment / Plan:     Visit Diagnoses: Polymyositis (HCC)  Rheumatoid arthritis involving multiple sites with positive rheumatoid factor (HCC)  High risk medication use  Jo-1 antibody positive  ILD (interstitial lung disease) (HCC)  Bronchiectasis without complication (HCC)  Bilateral carpal tunnel syndrome  Raynaud's syndrome without gangrene  Primary osteoarthritis of both hands  Primary osteoarthritis of both feet  Fibromyalgia  Trapezius muscle spasm  Other fatigue  Fibromuscular dysplasia (HCC)  Erythema nodosum  History of mitral valve prolapse  History of migraine  Vitamin D deficiency  Orders: No orders of the defined types were placed in this encounter.  No orders of the defined types were placed in this encounter.   Face-to-face time spent with patient was *** minutes. Greater than 50% of time was spent in counseling and coordination of care.  Follow-Up Instructions: No follow-ups on file.   Gearldine Bienenstock, PA-C  Note - This record has been created using Dragon software.  Chart creation errors have been sought, but may not always  have been located. Such creation errors do not reflect on  the standard of medical care.

## 2023-06-12 NOTE — Telephone Encounter (Signed)
Called patient to let her know the handicap placard is ready for pickup. Patient verbalized understanding. Placed in front office filing cabinet. Nothing further needed.

## 2023-06-12 NOTE — Telephone Encounter (Signed)
Completed yesterday. Amy left it up front for her

## 2023-06-18 DIAGNOSIS — F4310 Post-traumatic stress disorder, unspecified: Secondary | ICD-10-CM | POA: Diagnosis not present

## 2023-06-19 ENCOUNTER — Other Ambulatory Visit: Payer: Self-pay | Admitting: *Deleted

## 2023-06-19 DIAGNOSIS — M0579 Rheumatoid arthritis with rheumatoid factor of multiple sites without organ or systems involvement: Secondary | ICD-10-CM | POA: Diagnosis not present

## 2023-06-19 DIAGNOSIS — Z79899 Other long term (current) drug therapy: Secondary | ICD-10-CM | POA: Diagnosis not present

## 2023-06-20 LAB — CBC WITH DIFFERENTIAL/PLATELET
Absolute Lymphocytes: 1434 {cells}/uL (ref 850–3900)
Absolute Monocytes: 672 {cells}/uL (ref 200–950)
Basophils Absolute: 32 {cells}/uL (ref 0–200)
Basophils Relative: 0.4 %
Eosinophils Absolute: 170 {cells}/uL (ref 15–500)
Eosinophils Relative: 2.1 %
HCT: 37.9 % (ref 35.0–45.0)
Hemoglobin: 12.5 g/dL (ref 11.7–15.5)
MCH: 28.4 pg (ref 27.0–33.0)
MCHC: 33 g/dL (ref 32.0–36.0)
MCV: 86.1 fL (ref 80.0–100.0)
MPV: 11 fL (ref 7.5–12.5)
Monocytes Relative: 8.3 %
Neutro Abs: 5792 {cells}/uL (ref 1500–7800)
Neutrophils Relative %: 71.5 %
Platelets: 267 10*3/uL (ref 140–400)
RBC: 4.4 10*6/uL (ref 3.80–5.10)
RDW: 12.3 % (ref 11.0–15.0)
Total Lymphocyte: 17.7 %
WBC: 8.1 10*3/uL (ref 3.8–10.8)

## 2023-06-20 LAB — COMPLETE METABOLIC PANEL WITH GFR
AG Ratio: 1.3 (calc) (ref 1.0–2.5)
ALT: 13 U/L (ref 6–29)
AST: 15 U/L (ref 10–35)
Albumin: 4.2 g/dL (ref 3.6–5.1)
Alkaline phosphatase (APISO): 60 U/L (ref 37–153)
BUN: 10 mg/dL (ref 7–25)
CO2: 28 mmol/L (ref 20–32)
Calcium: 9.4 mg/dL (ref 8.6–10.4)
Chloride: 105 mmol/L (ref 98–110)
Creat: 0.61 mg/dL (ref 0.50–1.03)
Globulin: 3.3 g/dL (ref 1.9–3.7)
Glucose, Bld: 107 mg/dL — ABNORMAL HIGH (ref 65–99)
Potassium: 4.6 mmol/L (ref 3.5–5.3)
Sodium: 139 mmol/L (ref 135–146)
Total Bilirubin: 0.3 mg/dL (ref 0.2–1.2)
Total Protein: 7.5 g/dL (ref 6.1–8.1)
eGFR: 104 mL/min/{1.73_m2} (ref >=60)

## 2023-06-20 LAB — CK: Total CK: 65 U/L (ref 29–143)

## 2023-06-20 NOTE — Progress Notes (Signed)
CMP normal, CK normal

## 2023-06-22 ENCOUNTER — Other Ambulatory Visit: Payer: Self-pay | Admitting: Rheumatology

## 2023-06-24 NOTE — Telephone Encounter (Signed)
Last Fill: 03/20/2023  Labs: 06/19/2023 CBC WNL CMP normal, CK normal   Next Visit: 07/09/2023  Last Visit: 03/20/2023  DX: Polymyositis   Current Dose per office note 03/20/2023: Imuran 100 mg by mouth daily   Okay to refill Imuran?

## 2023-06-25 ENCOUNTER — Ambulatory Visit: Payer: Federal, State, Local not specified - PPO | Admitting: Physician Assistant

## 2023-06-25 DIAGNOSIS — M3320 Polymyositis, organ involvement unspecified: Secondary | ICD-10-CM

## 2023-06-25 DIAGNOSIS — I7300 Raynaud's syndrome without gangrene: Secondary | ICD-10-CM

## 2023-06-25 DIAGNOSIS — L52 Erythema nodosum: Secondary | ICD-10-CM

## 2023-06-25 DIAGNOSIS — E559 Vitamin D deficiency, unspecified: Secondary | ICD-10-CM

## 2023-06-25 DIAGNOSIS — Z79899 Other long term (current) drug therapy: Secondary | ICD-10-CM

## 2023-06-25 DIAGNOSIS — J849 Interstitial pulmonary disease, unspecified: Secondary | ICD-10-CM

## 2023-06-25 DIAGNOSIS — M19071 Primary osteoarthritis, right ankle and foot: Secondary | ICD-10-CM

## 2023-06-25 DIAGNOSIS — M0579 Rheumatoid arthritis with rheumatoid factor of multiple sites without organ or systems involvement: Secondary | ICD-10-CM

## 2023-06-25 DIAGNOSIS — M19041 Primary osteoarthritis, right hand: Secondary | ICD-10-CM

## 2023-06-25 DIAGNOSIS — J479 Bronchiectasis, uncomplicated: Secondary | ICD-10-CM

## 2023-06-25 DIAGNOSIS — Z8679 Personal history of other diseases of the circulatory system: Secondary | ICD-10-CM

## 2023-06-25 DIAGNOSIS — M797 Fibromyalgia: Secondary | ICD-10-CM

## 2023-06-25 DIAGNOSIS — R5383 Other fatigue: Secondary | ICD-10-CM

## 2023-06-25 DIAGNOSIS — F4310 Post-traumatic stress disorder, unspecified: Secondary | ICD-10-CM | POA: Diagnosis not present

## 2023-06-25 DIAGNOSIS — G5603 Carpal tunnel syndrome, bilateral upper limbs: Secondary | ICD-10-CM

## 2023-06-25 DIAGNOSIS — R768 Other specified abnormal immunological findings in serum: Secondary | ICD-10-CM

## 2023-06-25 DIAGNOSIS — Z8669 Personal history of other diseases of the nervous system and sense organs: Secondary | ICD-10-CM

## 2023-06-25 DIAGNOSIS — M62838 Other muscle spasm: Secondary | ICD-10-CM

## 2023-06-25 DIAGNOSIS — I773 Arterial fibromuscular dysplasia: Secondary | ICD-10-CM

## 2023-06-25 NOTE — Progress Notes (Unsigned)
Office Visit Note  Patient: Jill Harper             Date of Birth: 03-Jun-1966           MRN: [id number]             PCP: Pincus Sanes, MD Referring: Pincus Sanes, MD Visit Date: 07/09/2023 Occupation: @GUAROCC @  Subjective:  Pain in both hands   History of Present Illness: Jill Harper is a 57 y.o. female with history of seropositive rheumatoid arthritis, polymyositis, and ILD.  Patient remains on  Enbrel 50 mg sq injections once weekly (02/18/20-1st injection) and Imuran 100 mg by mouth daily.  Jill Harper continues to tolerate combination therapy without any side effects.  Jill Harper has not had any gaps in therapy.  Patient states that Jill Harper has had increased pain and stiffness involving both hands which Jill Harper attributes to the colder weather temperatures.  Jill Harper is also having ongoing myofascial pain and fatigue secondary to fibromyalgia.  Patient has upcoming appointment scheduled at [facility] pulmonary in 07/17/2023.  Jill Harper will be having updated pulmonary function testing and will be scheduled for high-resolution chest CT.  Jill Harper has been using 2 L of oxygen at night as well as if having to go to appointments for prolonged periods of time. Jill Harper denies any recent or recurrent infections.   Activities of Daily Living:  Patient reports morning stiffness for 30 minutes.   Patient Reports nocturnal pain.  Difficulty dressing/grooming: Reports Difficulty climbing stairs: Reports Difficulty getting out of chair: Denies Difficulty using hands for taps, buttons, cutlery, and/or writing: Reports  Review of Systems  Constitutional:  Positive for fatigue.  HENT:  Positive for mouth dryness. Negative for mouth sores.   Eyes:  Positive for dryness.  Respiratory:  Positive for shortness of breath.   Cardiovascular:  Negative for chest pain and palpitations.  Gastrointestinal:  Negative for blood in stool, constipation and diarrhea.  Endocrine: Negative for increased urination.  Genitourinary:  Negative for  involuntary urination.  Musculoskeletal:  Positive for joint pain, joint pain, joint swelling, myalgias, muscle weakness, morning stiffness, muscle tenderness and myalgias. Negative for gait problem.  Skin:  Positive for sensitivity to sunlight. Negative for color change, rash and hair loss.  Allergic/Immunologic: Positive for susceptible to infections.  Neurological:  Positive for dizziness and headaches.  Hematological:  Negative for swollen glands.  Psychiatric/Behavioral:  Positive for depressed mood and sleep disturbance. The patient is nervous/anxious.     PMFS History:  Patient Active Problem List   Diagnosis Date Noted   Subclinical hypothyroidism 05/31/2023   Rib pain on left side 01/08/2023   Back pain 11/21/2022   Left arm pain 09/13/2022   Left arm swelling 09/13/2022   Right upper quadrant pain 08/15/2022   Palpitations-related to MVP 04/02/2022   Nocturnal hypoxia 01/16/2022   Fibromuscular dysplasia of wall of intracranial artery (HCC) 12/20/2021   Anxiety 09/06/2021   Tingling 06/28/2021   Occipital neuralgia of left side 01/06/2020   Metabolic syndrome X 07/28/2019   Entrapment of left ulnar nerve 07/27/2019   Right hand pain 07/27/2019   Bilateral carpal tunnel syndrome 07/21/2019   Costochondritis 03/16/2019   Rib pain on right side 03/16/2019   Prediabetes 11/27/2018   Fibromuscular dysplasia (HCC) 10/17/2018   Hypertension 10/17/2018   Bronchiectasis with (acute) exacerbation (HCC) 10/08/2018   OSA (obstructive sleep apnea) 08/20/2018   Rheumatoid arthritis (HCC) - Dr Corliss Skains 04/17/2018   ILD (interstitial lung disease) (HCC) 04/17/2018   Dyspnea  04/01/2018   Bronchiectasis (HCC) 09/02/2017   LVH (left ventricular hypertrophy), mild 06/02/2017   Mitral regurgitation 05/22/2017   Jo-1 antibody positive 01/29/2017   Primary osteoarthritis of both feet 01/29/2017   Primary osteoarthritis of both hands 01/29/2017   Left shoulder tendonitis 01/17/2017    Vitamin D deficiency 04/26/2016   Cough 07/18/2015   URI (upper respiratory infection) 07/12/2015   MVP (mitral valve prolapse)    Erythema nodosum    INCONTINENCE, URGE 05/26/2010   Migraine headache 02/15/2010   Fibromyalgia 06/20/2009   Vertigo 03/05/2008   Polymyositis (HCC) 02/11/2008   BRONCHIECTASIS 02/10/2008    Past Medical History:  Diagnosis Date   Abnormal liver function test    Bronchiectasis        Dyspnea    Fibromuscular dysplasia (HCC)    Fibromyalgia    Heart murmur    ILD (interstitial lung disease) (HCC)    Joint pain    Menorrhagia    MIGRAINE HEADACHE    MVP (mitral valve prolapse)    Osteoarthritis    Polymyositis (HCC)    Dr [name]; chronic MTX   Pulmonary fibrosis (HCC)    Rheumatoid arthritis (HCC)    Sleep apnea    Vertigo    Vitamin B 12 deficiency    Vitamin D deficiency     Family History  Problem Relation Age of Onset   Diabetes Mother    Fibromyalgia Mother    Ulcers Mother    Heart failure Mother    Thyroid disease Mother    Obesity Mother    Multiple myeloma Father    Hypertension Father    Stroke Father    Lupus Sister    Other Sister        abdominal adhesions resulting in bowel obstruction   Migraines Sister    Heart disease Brother        bypass surgery   Rheum arthritis Sister    Multiple myeloma Sister    Hypertension Sister    Heart attack Sister    Diabetes Sister    Hypertension Sister    Rheum arthritis Sister    Diabetes Sister    Diabetes Brother    Headache Other        siblings with headaches but not diagnosed as migraines   Past Surgical History:  Procedure Laterality Date   ablation uterine  2010   CARDIAC CATHETERIZATION  2002   normal   DILATION AND CURETTAGE OF UTERUS  08-31-08   Dr Zelphia Cairo   IR ANGIO INTRA EXTRACRAN SEL COM CAROTID INNOMINATE BILAT MOD SED  09/18/2018   IR ANGIO VERTEBRAL SEL VERTEBRAL BILAT MOD SED  09/18/2018   IR US GUIDE VASC ACCESS RIGHT  09/18/2018    Social History   Social History Narrative   Exercise: trying to walk - limited by fatigue   Lives at home with husband    Right handed   Caffeine: none    Immunization History  Administered Date(s) Administered   Influenza Split 04/10/2011, 06/18/2012, 05/06/2013   Influenza Whole 05/19/2008, 05/15/2009, 06/01/2010   Influenza, Seasonal, Injecte, Preservative Fre 05/31/2023   Influenza,inj,Quad PF,6+ Mos 04/20/2014, 05/26/2015, 05/31/2016, 04/05/2017, 06/16/2018, 04/22/2019, 04/22/2020   Influenza-Unspecified 04/11/2021, 04/02/2022   PFIZER(Purple Top)SARS-COV-2 Vaccination 10/27/2019, 11/19/2019   Pneumococcal Conjugate-13 01/29/2017   Pneumococcal Polysaccharide-23 08/07/2007, 06/16/2018   Td 07/12/2009   Tdap 01/06/2020   Unspecified SARS-COV-2 Vaccination 10/27/2019, 11/19/2019, 04/04/2021   Zoster, Live 06/06/2022     Objective: Vital Signs: BP  127/81 (BP Location: Left Arm, Patient Position: Sitting, Cuff Size: Normal)   Pulse 77   Resp 16   Ht 5\' 8"  (1.727 m)   Wt (!) 305 lb (138.3 kg)   BMI 46.38 kg/m    Physical Exam Vitals and nursing note reviewed.  Constitutional:      Appearance: Jill Harper is well-developed.  HENT:     Head: Normocephalic and atraumatic.  Eyes:     Conjunctiva/sclera: Conjunctivae normal.  Cardiovascular:     Rate and Rhythm: Normal rate and regular rhythm.     Heart sounds: Normal heart sounds.  Pulmonary:     Effort: Pulmonary effort is normal.     Breath sounds: Normal breath sounds.  Abdominal:     General: Bowel sounds are normal.     Palpations: Abdomen is soft.  Musculoskeletal:     Cervical back: Normal range of motion.  Lymphadenopathy:     Cervical: No cervical adenopathy.  Skin:    General: Skin is warm and dry.     Capillary Refill: Capillary refill takes less than 2 seconds.  Neurological:     Mental Status: Jill Harper is alert and oriented to person, place, and time.  Psychiatric:        Behavior: Behavior normal.       Musculoskeletal Exam: C-spine has good ROM. Trapezius muscle tension and tenderness bilaterally. Shoulder joints have good ROM.  Elbow joints, wrist joints, MCPs, PIPs, and DIPs good ROM with no synovitis.  Tenderness of the right 1st-4th PIP joints.  Hip joints have good ROM with some discomfort bilaterally.  Knee joints have good ROM with no warmth or effusion.  Ankle joints have good ROM with no tenderness or joint swelling.    CDAI Exam: CDAI Score: -- Patient Global: 20 / 100; Provider Global: 60 / 100 Swollen: --; Tender: -- Joint Exam 07/09/2023   No joint exam has been documented for this visit   There is currently no information documented on the homunculus. Go to the Rheumatology activity and complete the homunculus joint exam.  Investigation: No additional findings.  Imaging: No results found.  Recent Labs: Lab Results  Component Value Date   WBC 8.1 06/19/2023   HGB 12.5 06/19/2023   PLT 267 06/19/2023   NA 139 06/19/2023   K 4.6 06/19/2023   CL 105 06/19/2023   CO2 28 06/19/2023   GLUCOSE 107 (H) 06/19/2023   BUN 10 06/19/2023   CREATININE 0.61 06/19/2023   BILITOT 0.3 06/19/2023   ALKPHOS 73 09/18/2022   AST 15 06/19/2023   ALT 13 06/19/2023   PROT 7.5 06/19/2023   ALBUMIN 4.3 09/18/2022   CALCIUM 9.4 06/19/2023   GFRAA 114 01/18/2021   QFTBGOLDPLUS NEGATIVE 02/26/2023    Speciality Comments: Prior therapy includes: MTX (d/c due to ILD progresssion)  Procedures:  No procedures performed Allergies: Sulfonamide derivatives     Assessment / Plan:     Visit Diagnoses: Rheumatoid arthritis involving multiple sites with positive rheumatoid factor (HCC) - +RF, +CCP: Patient presents today with increased pain and stiffness involving both hands which Jill Harper attributes to cooler weather temperatures.  Jill Harper has been putting her hands under warm water first thing in the morning and has been massaging her hands to help alleviate the aching and stiffness.  Discussed  the use of arthritis compression gloves.  On examination Jill Harper has tenderness over the right 1st through 4th PIP joints and the left first and second PIP joints.  No synovitis was noted.  Jill Harper was able to make a complete fist bilaterally.  Jill Harper remains on Enbrel 50 mg sq injections once weekly and Imuran 100 mg daily.  Jill Harper is tolerating combination therapy without any side effects and has not had any recent gaps in therapy.  No medication changes will be made at this time.  Jill Harper was vies notify us if Jill Harper develops increased joint pain or joint swelling.  Jill Harper will follow-up in the office in 3 months or sooner if needed.   High risk medication use - Enbrel 50 mg sq injections once weekly (02/18/20-1st injection) and Imuran 100 mg by mouth daily. d/c MTX due to progressioin of ILD. CBC and CMP updated on 06/19/23. Her next lab work will be due in February and every 3 months. Standing orders for CBC and CMP remain in place.  TB gold negative on 02/26/23.  No recent or recurrent infections. Discussed the importance of holding enbrel and imuran if Jill Harper develops signs or symptoms of an infection and to resume once the infection has completely cleared.    Polymyositis (HCC) - Dx by Dr. Kellie Simmering 2007, CK 801, Jo-1 positive: Jill Harper is not exhibiting any signs or symptoms of a polymyositis flare.  No upper extremity or lower extremity weakness noted at this time.  Jill Harper continues to have myofascial pain and muscle fatigue due to underlying fibromyalgia.  CK WNL-65 on 06/29/23.   Jill Harper will notify us if Jill Harper develops signs or symptoms of a flare.  Plan to continue to check CK every 3 months with routine lab work.   Jo-1 antibody positive  ILD (interstitial lung disease) (HCC) - Followed by Dr. Thamas Jaegers, NP at [facility] pulm.  Completed pulmonary rehab. Stable.  Last HRCT March 2023 without disease progression.  Has not had PFTs since December 2022.  Scheduled to have updated PFTs on 07/17/2023.  Jill Harper will also be scheduled  for an updated high-resolution chest CT.  Bronchiectasis without complication (HCC) - Followed by Dr. Thamas Jaegers, NP: Patient is using smart vest twice daily which has been helpful.  Reviewed office visit note from 07/01/2023--stable without any recent exacerbations.  Recommended continuing mucociliary clearance and bronchodilator therapies.  Bilateral carpal tunnel syndrome: Not currently symptomatic.   Raynaud's syndrome without gangrene: Not currently symptomatic.   Primary osteoarthritis of both hands: Patient presents today with increased pain and stiffness involving both hands.  Jill Harper attributes the increased discomfort to weather change.  Discussed the use of arthritis compression gloves.  No synovitis was noted on examination today.  Jill Harper was advised to notify us if Jill Harper develops any new or worsening symptoms.  Primary osteoarthritis of both feet: Jill Harper is not experiencing any increased discomfort in her feet at this time.   Fibromyalgia: Patient continues to experience myalgias and muscle tenderness due to fibromyalgia.  Jill Harper has been experiencing increased discomfort and fatigue which Jill Harper attributes to weather change.  Jill Harper currently rates her fibromyalgia pain a 7 out of 10.  Trapezius muscle spasm: Jill Harper has trapezius muscle tension tenderness bilaterally.  Other fatigue: Stable.    Vitamin D deficiency - Future order for vitamin D placed today to be updated with next lab work. Plan: VITAMIN D 25 Hydroxy (Vit-D Deficiency, Fractures)  Other medical conditions are listed as follows:   Fibromuscular dysplasia (HCC) - - multifocal fibromuscular dysplasia of the bilateral internal carotid arteries-confirmed by catheter-based angiography-Followed by Dr. Selena Batten.  Erythema nodosum: No recurrence.   History of mitral valve prolapse  History of migraine    Orders:  Orders Placed This Encounter  Procedures   VITAMIN D 25 Hydroxy (Vit-D Deficiency, Fractures)   No orders of the  defined types were placed in this encounter.    Follow-Up Instructions: Return in about 3 months (around 10/07/2023) for Rheumatoid arthritis.   Gearldine Bienenstock, PA-C  Note - This record has been created using Dragon software.  Chart creation errors have been sought, but may not always  have been located. Such creation errors do not reflect on  the standard of medical care.

## 2023-06-29 DIAGNOSIS — G4733 Obstructive sleep apnea (adult) (pediatric): Secondary | ICD-10-CM | POA: Diagnosis not present

## 2023-06-29 DIAGNOSIS — J471 Bronchiectasis with (acute) exacerbation: Secondary | ICD-10-CM | POA: Diagnosis not present

## 2023-07-01 ENCOUNTER — Encounter: Payer: Self-pay | Admitting: Nurse Practitioner

## 2023-07-01 ENCOUNTER — Ambulatory Visit: Payer: Federal, State, Local not specified - PPO | Admitting: Nurse Practitioner

## 2023-07-01 VITALS — BP 134/70 | HR 97 | Temp 98.0°F | Ht 68.0 in | Wt 305.2 lb

## 2023-07-01 DIAGNOSIS — J849 Interstitial pulmonary disease, unspecified: Secondary | ICD-10-CM

## 2023-07-01 DIAGNOSIS — G4733 Obstructive sleep apnea (adult) (pediatric): Secondary | ICD-10-CM

## 2023-07-01 DIAGNOSIS — G4734 Idiopathic sleep related nonobstructive alveolar hypoventilation: Secondary | ICD-10-CM

## 2023-07-01 DIAGNOSIS — J479 Bronchiectasis, uncomplicated: Secondary | ICD-10-CM

## 2023-07-01 MED ORDER — SODIUM CHLORIDE 3 % IN NEBU: INHALATION_SOLUTION | RESPIRATORY_TRACT | 11 refills | Status: AC

## 2023-07-01 NOTE — Assessment & Plan Note (Signed)
Continue supplemental oxygen at night and as needed during the day for goal greater than 88 to 90%

## 2023-07-01 NOTE — Assessment & Plan Note (Signed)
Excellent compliance and control on download.  Receives benefit from use.  Aware of proper care/use.  Safe driving practices reviewed.  Encouraged to continue using nightly.

## 2023-07-01 NOTE — Progress Notes (Signed)
@Patient  ID: Jill Harper, female    DOB: Dec 02, 1965, 57 y.o.   MRN: [id number]  Chief Complaint  Patient presents with   Follow-up    No concerns.    Referring provider: Pincus Sanes, MD  HPI: 57 year old female, never smoker followed for ILD in the setting of seropositive RA and polymyositis, bronchiectasis, OSA on CPAP and nocturnal hypoxemia. She is a former patient of Dr. Evlyn Courier and last seen in office on 02/27/2023. Past medical history significant for migraines, MR, fibromuscular dysplasia, HTN, anxiety.  She is followed by rheumatology and is on Imuran and Enbrel.  TEST/EVENTS:  07/28/2018 HST: AHI 5, SpO2 low 83% 11/05/2018 ONO on CPAP >> 1 hr 15 min </88%  07/28/2019 PFTs: FVC 78, FEV1 88, ratio 89, TLC 71, DLCOcor 80.  Mild to moderate restrictive defect 10/23/2021 HRCT chest: There is a small hiatal hernia.  There are patchy areas of groundglass attenuation, septal thickening, small amount of subpleural reticulation, mild cylindrical bronchiectasis and peripheral bronchiolectasis.  There is a definitive craniocaudal gradient; unchanged when compared to previous.  No frank honeycombing is present.  Categorized as probable UIP; however, given stability, not entirely able to rule out NSIP  02/27/2023: OV with Dr. Craige Cotta.  Doing better since last visit.  Has up-and-down days but more ups then down.  Does have trouble in hot, humid weather.  Developed costochondritis and had to stop using chest vest.  Has intermittent cough with clear sputum.  No issues using CPAP.  Uses oxygen at night and as needed during the day.  Not getting headaches at nighttime anymore.  Advised to start using chest vest again as needed and as tolerated.  Unable to tolerate flutter valve due to worsening headaches.  Follow-up with rheumatology as scheduled.  07/01/2023: Today-follow-up Patient presents today for follow-up.  She has been doing well since her last visit.  Has not had any flareups requiring treatment.   Feels like her breathing is at her baseline.  Has an occasional morning cough, which is normal for her.  No excessive or purulent sputum production.  Not had any fevers, chills, hemoptysis. No issues with leg swelling or orthopnea. Uses CPAP nightly and receives benefit from use. She does use her oxygen some when she goes out of the house. She wears it at night bled through her CPAP. No low O2 levels at home. Has not had PFTs in over a year. Able to do vest therapy 1-2 times a day. She's not been using hypertonic nebs recently and done okay without but would like a refill on hand. Still uses Trelegy. Rarely uses albuterol.   06/01/2023-06/30/2023: CPAP 7-15 cmH2O 30/30 days; 100% >4 hr; average use 12 hours 54 minutes Pressure 95th 13.3 Leaks 95th 11 AHI 4.8  Allergies  Allergen Reactions   Sulfonamide Derivatives     REACTION: hives    Immunization History  Administered Date(s) Administered   Influenza Split 04/10/2011, 06/18/2012, 05/06/2013   Influenza Whole 05/19/2008, 05/15/2009, 06/01/2010   Influenza, Seasonal, Injecte, Preservative Fre 05/31/2023   Influenza,inj,Quad PF,6+ Mos 04/20/2014, 05/26/2015, 05/31/2016, 04/05/2017, 06/16/2018, 04/22/2019, 04/22/2020   Influenza-Unspecified 04/11/2021, 04/02/2022   PFIZER(Purple Top)SARS-COV-2 Vaccination 10/27/2019, 11/19/2019   Pneumococcal Conjugate-13 01/29/2017   Pneumococcal Polysaccharide-23 08/07/2007, 06/16/2018   Td 07/12/2009   Tdap 01/06/2020   Unspecified SARS-COV-2 Vaccination 10/27/2019, 11/19/2019, 04/04/2021   Zoster, Live 06/06/2022    Past Medical History:  Diagnosis Date   Abnormal liver function test    Bronchiectasis  Dyspnea    Fibromuscular dysplasia (HCC)    Fibromyalgia    Heart murmur    ILD (interstitial lung disease) (HCC)    Joint pain    Menorrhagia    MIGRAINE HEADACHE    MVP (mitral valve prolapse)    Osteoarthritis    Polymyositis (HCC)    Dr [name]; chronic MTX    Pulmonary fibrosis (HCC)    Rheumatoid arthritis (HCC)    Sleep apnea    Vertigo    Vitamin B 12 deficiency    Vitamin D deficiency     Tobacco History: Social History   Tobacco Use  Smoking Status Never   Passive exposure: Never  Smokeless Tobacco Never  Tobacco Comments   Married, lives with spouse. 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 120 vial 5   albuterol (VENTOLIN HFA) 108 (90 Base) MCG/ACT inhaler INHALE 2 PUFFS BY MOUTH INTO THE LUNGS FOUR TIMES DAILY AS NEEDED 8.5 g 1   alendronate (FOSAMAX) 70 MG tablet Take 1 tablet by mouth once a week.     aspirin EC 81 MG tablet Take 81 mg by mouth daily.     atenolol (TENORMIN) 25 MG tablet TAKE 1 TABLET(25 MG) BY MOUTH DAILY 90 tablet 2   azaTHIOprine (IMURAN) 50 MG tablet TAKE 2 TABLETS BY MOUTH DAILY 180 tablet 0   Cholecalciferol (VITAMIN D) 50 MCG (2000 UT) CAPS Take by mouth.     diclofenac Sodium (VOLTAREN) 1 % GEL APPLY 2 TO 4 GRAMS TOPICALLY TO AFFECTED JOINT FOUR TIMES DAILY AS NEEDED 400 g 2   DULoxetine (CYMBALTA) 30 MG capsule TAKE ONE CAPSULE BY MOUTH DAILY FOR TOAL OF 90 MG DAILY 30 capsule 3   DULoxetine (CYMBALTA) 60 MG capsule TAKE 1 CAPSULE(60 MG) BY MOUTH DAILY 90 capsule 1   ENBREL MINI 50 MG/ML injection INSERT 1 MINI CARTRIDGE INTO AUTOINJECTOR AND INJECT 50 MG UNDER THE SKIN EVERY 7 DAYS 4 mL 0   gabapentin (NEURONTIN) 100 MG capsule Take 3 capsules (300 mg total) by mouth at bedtime. TAKE 3 CAPSULES(300 MG) BY MOUTH AT BEDTIME 270 capsule 3   Galcanezumab-gnlm (EMGALITY) 120 MG/ML SOAJ  ADMINISTER 1 ML UNDER THE SKIN EVERY 30 DAYS 3 mL 4   guaiFENesin (MUCINEX) 600 MG 12 hr tablet Take 2 tablets (1,200 mg total) by mouth 2 (two) times daily as needed for cough or to loosen phlegm. (Patient taking differently: Take 1,200 mg by mouth 2 (two) times daily.)     meclizine (ANTIVERT) 25 MG tablet Take 1 tablet (25 mg total) by mouth 3 (three) times daily as needed for dizziness. 30 tablet 0   metFORMIN (GLUCOPHAGE) 500 MG tablet TAKE 1 TABLET(500 MG) BY MOUTH TWICE DAILY WITH A MEAL 180 tablet 1   methocarbamol (ROBAXIN) 500 MG tablet Take 1 tablet (500 mg total) by mouth every 8 (eight) hours as needed. 60 tablet 3   Multiple Vitamins-Minerals (MULTIVITAMIN GUMMIES ADULT PO) Take 2 each by mouth daily.     Rimegepant Sulfate (NURTEC) 75 MG TBDP DISSOLVE ONE TABLET BY MOUTH DAILY AS NEEDED  FOR MIGRAINES. TAKE AS CLOSE TO ONSET OF MIGRAINE AS POSSIBLE. 1 TABLET DAILY MAXIMUM. 15 tablet 11   telmisartan (MICARDIS) 20 MG tablet TAKE 1 TABLET(20 MG) BY MOUTH DAILY 90 tablet 1   TRELEGY ELLIPTA 100-62.5-25 MCG/ACT AEPB INHALE 1 PUFF INTO THE LUNGS DAILY 60 each 5   sodium chloride HYPERTONIC 3 % nebulizer solution Take by nebulization as needed for other. 750 mL 12   No facility-administered medications prior to visit.     Review of Systems:   Constitutional: No weight loss or gain, night sweats, fevers, chills, fatigue, lassitude HEENT: No headaches, difficulty swallowing, tooth/dental problems, or sore throat. No sneezing, itching, ear ache, nasal congestion, or post nasal drip CV:  No chest pain, orthopnea, PND, swelling in lower extremities, anasarca, dizziness, palpitations, syncope Resp: +shortness of breath with exertion (baseline); occasional cough (baseline); rare chest congestion (baseline). No excess mucus or change in color of mucus. No hemoptysis. No wheezing.  No chest wall deformity GI:  No heartburn, indigestion, abdominal pain, nausea, vomiting, diarrhea, change in bowel  habits, loss of appetite, bloody stools.  Skin: No rash, lesions, ulcerations MSK:  No joint pain or swelling.  No decreased range of motion.  No back pain. Neuro: No dizziness or lightheadedness.  Psych: No depression or anxiety. Mood stable.     Physical Exam:  BP 134/70 (BP Location: Right Arm, Patient Position: Sitting, Cuff Size: Large)   Pulse 97   Temp 98 F (36.7 C) (Oral)   Ht 5\' 8"  (1.727 m)   Wt (!) 305 lb 3.2 oz (138.4 kg)   SpO2 97%   BMI 46.41 kg/m   GEN: Pleasant, interactive, well-appearing; obese; in no acute distress. HEENT:  Normocephalic and atraumatic. PERRLA. Sclera white. Nasal turbinates pink, moist and patent bilaterally. No rhinorrhea present. Oropharynx pink and moist, without exudate or edema. No lesions, ulcerations, or postnasal drip.  NECK:  Supple w/ fair ROM. No JVD present. Normal carotid impulses w/o bruits. Thyroid symmetrical with no goiter or nodules palpated. No lymphadenopathy.   CV: RRR, no m/r/g, no peripheral edema. Pulses intact, +2 bilaterally. No cyanosis, pallor or clubbing. PULMONARY:  Unlabored, regular breathing. Clear bilaterally A&P w/o wheeze/rales/rhonchi. No accessory muscle use. No dullness to percussion. GI: BS present and normoactive. Soft, non-tender to palpation. No organomegaly or masses detected. MSK: No erythema, warmth or tenderness. Cap refil <2 sec all extrem. No deformities or joint swelling noted.  Neuro: A/Ox3. No focal deficits noted.   Skin: Warm, no lesions or rashe Psych: Normal affect and behavior. Judgement and thought content appropriate.     Lab Results:  CBC    Component Value Date/Time   WBC 8.1 06/19/2023 1111   RBC 4.40 06/19/2023 1111   HGB 12.5 06/19/2023 1111   HGB 14.2 10/12/2019 1432   HCT 37.9 06/19/2023 1111   HCT 41.9 10/12/2019 1432   PLT 267 06/19/2023 1111   PLT 292 10/12/2019 1432   MCV 86.1 06/19/2023 1111   MCV 86 10/12/2019 1432   MCH 28.4 06/19/2023 1111   MCHC 33.0  06/19/2023 1111   RDW 12.3 06/19/2023 1111   RDW 12.1 10/12/2019 1432   LYMPHSABS 1,501 02/26/2023 1144   LYMPHSABS 1.3 10/12/2019 1432   MONOABS 0.7 07/11/2021 0813   EOSABS 170 06/19/2023 1111   EOSABS 0.1 10/12/2019 1432   BASOSABS 32 06/19/2023 1111   BASOSABS 0.0 10/12/2019 1432    BMET    Component Value Date/Time   NA 139 06/19/2023 1111  NA 141 10/12/2019 1432   K 4.6 06/19/2023 1111   CL 105 06/19/2023 1111   CO2 28 06/19/2023 1111   GLUCOSE 107 (H) 06/19/2023 1111   BUN 10 06/19/2023 1111   BUN 11 10/12/2019 1432   CREATININE 0.61 06/19/2023 1111   CALCIUM 9.4 06/19/2023 1111   GFRNONAA 98 01/18/2021 1626   GFRAA 114 01/18/2021 1626    BNP No results found for: "BNP"   Imaging:  No results found.  Administration History     None          Latest Ref Rng & Units 07/27/2021    3:59 PM 02/17/2020    4:03 PM 02/05/2019    3:59 PM 04/17/2018    2:54 PM 12/02/2015   11:56 AM  PFT Results  FVC-Pre L 2.55  2.42  2.58  2.74  3.40   FVC-Predicted Pre % 78  73  78  90  99   FVC-Post L 2.69  2.42  2.56   3.24   FVC-Predicted Post % 82  73  77   94   Pre FEV1/FVC % % 90  86  89  90  87   Post FEV1/FCV % % 89  90  86   92   FEV1-Pre L 2.29  2.09  2.30  2.46  2.97   FEV1-Predicted Pre % 88  80  87  101  108   FEV1-Post L 2.41  2.18  2.21   2.96   DLCO uncorrected ml/min/mmHg 18.89  18.58  19.43  20.26  19.92   DLCO UNC% % 80  78  81  76  65   DLCO corrected ml/min/mmHg 18.89  18.42  19.68  20.01  19.46   DLCO COR %Predicted % 80  77  82  75  64   DLVA Predicted % 120  125  120  98  79   TLC L 4.04  4.22  4.21   4.95   TLC % Predicted % 71  74  74   86   RV % Predicted % 66  74  69   76     Lab Results  Component Value Date   NITRICOXIDE 9 12/11/2017        Assessment & Plan:   Bronchiectasis (HCC) Stable without any recent exacerbations.  Continue current mucociliary clearance and bronchodilator therapies.  Advised to stay up-to-date on  vaccines.  Avoid sick exposures.  Action plan in place. She would like referral to Dr. Craige Cotta at [facility]. Placed today. Advised to follow up with [name] if there is an issue with insurance or acute symptoms in interim.   Patient Instructions  Continue Albuterol inhaler 2 puffs or 3 mL neb every 6 hours as needed for shortness of breath or wheezing. Notify if symptoms persist despite rescue inhaler/neb use.  Continue Trelegy 1 puff daily. Brush tongue and rinse mouth afterwards Continue mucinex 1200 mg Twice daily for chest congestion/cough Continue supplemental oxygen bled through CPAP every night and as needed with activity for goal >88-90% Continue hypertonic saline nebs Twice daily as needed for cough/congestion  Continue vest therapy twice daily as needed for chest congestion/cough   Referral placed to Dr. Evlyn Courier office  Schedule next available pulmonary function test.  If symptoms do not improve or worsen, please contact office for sooner follow up or seek emergency care.   ILD (interstitial lung disease) (HCC) CT ILD in setting of seropositive RA, polymyositis.  She is on Enbrel and Imuran.  Last HRCT March 2023 without disease progression.  Has not had PFTs since December 2022.  Will order repeat pulmonary function testing for surveillance.  She is clinically stable at this point.  Follow-up with rheumatology as scheduled  OSA (obstructive sleep apnea) Excellent compliance and control on download.  Receives benefit from use.  Aware of proper care/use.  Safe driving practices reviewed.  Encouraged to continue using nightly.  Nocturnal hypoxia Continue supplemental oxygen at night and as needed during the day for goal greater than 88 to 90%      I spent 32 minutes of dedicated to the care of this patient on the date of this encounter to include pre-visit review of records, face-to-face time with the patient discussing conditions above, post visit ordering of testing, clinical  documentation with the electronic health record, making appropriate referrals as documented, and communicating necessary findings to members of the patients care team.  Noemi Chapel, NP 07/01/2023  Pt aware and understands NP's role.

## 2023-07-01 NOTE — Patient Instructions (Addendum)
Continue Albuterol inhaler 2 puffs or 3 mL neb every 6 hours as needed for shortness of breath or wheezing. Notify if symptoms persist despite rescue inhaler/neb use.  Continue Trelegy 1 puff daily. Brush tongue and rinse mouth afterwards Continue mucinex 1200 mg Twice daily for chest congestion/cough Continue supplemental oxygen bled through CPAP every night and as needed with activity for goal >88-90% Continue hypertonic saline nebs Twice daily as needed for cough/congestion  Continue vest therapy twice daily as needed for chest congestion/cough   Referral placed to Dr. Evlyn Courier office  Schedule next available pulmonary function test.  If symptoms do not improve or worsen, please contact office for sooner follow up or seek emergency care.

## 2023-07-01 NOTE — Assessment & Plan Note (Addendum)
CT ILD in setting of seropositive RA, polymyositis.  She is on Enbrel and Imuran.  Last HRCT March 2023 without disease progression.  Has not had PFTs since December 2022.  Will order repeat pulmonary function testing for surveillance.  She is clinically stable at this point.  Follow-up with rheumatology as scheduled

## 2023-07-01 NOTE — Assessment & Plan Note (Addendum)
Stable without any recent exacerbations.  Continue current mucociliary clearance and bronchodilator therapies.  Advised to stay up-to-date on vaccines.  Avoid sick exposures.  Action plan in place. She would like referral to Dr. Craige Cotta at [facility]. Placed today. Advised to follow up with [name] if there is an issue with insurance or acute symptoms in interim.   Patient Instructions  Continue Albuterol inhaler 2 puffs or 3 mL neb every 6 hours as needed for shortness of breath or wheezing. Notify if symptoms persist despite rescue inhaler/neb use.  Continue Trelegy 1 puff daily. Brush tongue and rinse mouth afterwards Continue mucinex 1200 mg Twice daily for chest congestion/cough Continue supplemental oxygen bled through CPAP every night and as needed with activity for goal >88-90% Continue hypertonic saline nebs Twice daily as needed for cough/congestion  Continue vest therapy twice daily as needed for chest congestion/cough   Referral placed to Dr. Evlyn Courier office  Schedule next available pulmonary function test.  If symptoms do not improve or worsen, please contact office for sooner follow up or seek emergency care.

## 2023-07-02 DIAGNOSIS — F4310 Post-traumatic stress disorder, unspecified: Secondary | ICD-10-CM | POA: Diagnosis not present

## 2023-07-09 ENCOUNTER — Ambulatory Visit: Payer: Federal, State, Local not specified - PPO | Attending: Physician Assistant | Admitting: Physician Assistant

## 2023-07-09 ENCOUNTER — Other Ambulatory Visit: Payer: Self-pay | Admitting: Physician Assistant

## 2023-07-09 ENCOUNTER — Encounter: Payer: Self-pay | Admitting: Physician Assistant

## 2023-07-09 VITALS — BP 127/81 | HR 77 | Resp 16 | Ht 68.0 in | Wt 305.0 lb

## 2023-07-09 DIAGNOSIS — R768 Other specified abnormal immunological findings in serum: Secondary | ICD-10-CM

## 2023-07-09 DIAGNOSIS — M0579 Rheumatoid arthritis with rheumatoid factor of multiple sites without organ or systems involvement: Secondary | ICD-10-CM

## 2023-07-09 DIAGNOSIS — M797 Fibromyalgia: Secondary | ICD-10-CM

## 2023-07-09 DIAGNOSIS — E559 Vitamin D deficiency, unspecified: Secondary | ICD-10-CM

## 2023-07-09 DIAGNOSIS — F4310 Post-traumatic stress disorder, unspecified: Secondary | ICD-10-CM | POA: Diagnosis not present

## 2023-07-09 DIAGNOSIS — M19071 Primary osteoarthritis, right ankle and foot: Secondary | ICD-10-CM

## 2023-07-09 DIAGNOSIS — M3320 Polymyositis, organ involvement unspecified: Secondary | ICD-10-CM

## 2023-07-09 DIAGNOSIS — Z79899 Other long term (current) drug therapy: Secondary | ICD-10-CM

## 2023-07-09 DIAGNOSIS — I773 Arterial fibromuscular dysplasia: Secondary | ICD-10-CM

## 2023-07-09 DIAGNOSIS — M19041 Primary osteoarthritis, right hand: Secondary | ICD-10-CM

## 2023-07-09 DIAGNOSIS — I7300 Raynaud's syndrome without gangrene: Secondary | ICD-10-CM

## 2023-07-09 DIAGNOSIS — L52 Erythema nodosum: Secondary | ICD-10-CM

## 2023-07-09 DIAGNOSIS — R5383 Other fatigue: Secondary | ICD-10-CM

## 2023-07-09 DIAGNOSIS — J849 Interstitial pulmonary disease, unspecified: Secondary | ICD-10-CM

## 2023-07-09 DIAGNOSIS — G5603 Carpal tunnel syndrome, bilateral upper limbs: Secondary | ICD-10-CM

## 2023-07-09 DIAGNOSIS — Z8679 Personal history of other diseases of the circulatory system: Secondary | ICD-10-CM

## 2023-07-09 DIAGNOSIS — M19042 Primary osteoarthritis, left hand: Secondary | ICD-10-CM

## 2023-07-09 DIAGNOSIS — M19072 Primary osteoarthritis, left ankle and foot: Secondary | ICD-10-CM

## 2023-07-09 DIAGNOSIS — J479 Bronchiectasis, uncomplicated: Secondary | ICD-10-CM

## 2023-07-09 DIAGNOSIS — M62838 Other muscle spasm: Secondary | ICD-10-CM

## 2023-07-09 DIAGNOSIS — Z8669 Personal history of other diseases of the nervous system and sense organs: Secondary | ICD-10-CM

## 2023-07-09 NOTE — Telephone Encounter (Signed)
Last Fill: 06/11/2023 (30 day supply)  Labs: 06/19/2023 CBC WNL CMP normal, CK normal   TB Gold: 02/26/2023 Neg    Next Visit: 10/08/2023  Last Visit: 07/09/2023  DX: Rheumatoid arthritis involving multiple sites with positive rheumatoid facto   Current Dose per office note 07/08/2023: Enbrel 50 mg sq injections once weekly   Okay to refill Enbrel?

## 2023-07-09 NOTE — Patient Instructions (Signed)

## 2023-07-13 ENCOUNTER — Other Ambulatory Visit: Payer: Self-pay | Admitting: Internal Medicine

## 2023-07-14 ENCOUNTER — Encounter: Payer: Self-pay | Admitting: Internal Medicine

## 2023-07-14 NOTE — Progress Notes (Unsigned)
Subjective:    Patient ID: Jill Harper, female    DOB: 04-11-1966, 57 y.o.   MRN: [id number]      HPI Jill Harper is here for No chief complaint on file.   Obesity -      Medications and allergies reviewed with patient and updated if appropriate.  Current Outpatient Medications on File Prior to Visit  Medication Sig Dispense Refill   albuterol (PROVENTIL) (2.5 MG/3ML) 0.083% nebulizer solution Take 3 mLs (2.5 mg total) by nebulization every 6 (six) hours as needed for wheezing or shortness of breath. 120 vial 5   albuterol (VENTOLIN HFA) 108 (90 Base) MCG/ACT inhaler INHALE 2 PUFFS BY MOUTH INTO THE LUNGS FOUR TIMES DAILY AS NEEDED 8.5 g 1   alendronate (FOSAMAX) 70 MG tablet Take 1 tablet by mouth once a week.     aspirin EC 81 MG tablet Take 81 mg by mouth daily.     atenolol (TENORMIN) 25 MG tablet TAKE 1 TABLET(25 MG) BY MOUTH DAILY 90 tablet 2   azaTHIOprine (IMURAN) 50 MG tablet TAKE 2 TABLETS BY MOUTH DAILY 180 tablet 0   Cholecalciferol (VITAMIN D) 50 MCG (2000 UT) CAPS Take by mouth.     diclofenac Sodium (VOLTAREN) 1 % GEL APPLY 2 TO 4 GRAMS TOPICALLY TO AFFECTED JOINT FOUR TIMES DAILY AS NEEDED 400 g 2   DULoxetine (CYMBALTA) 30 MG capsule TAKE ONE CAPSULE BY MOUTH DAILY FOR TOAL OF 90 MG DAILY 30 capsule 3   DULoxetine (CYMBALTA) 60 MG capsule TAKE 1 CAPSULE(60 MG) BY MOUTH DAILY 90 capsule 1   ENBREL MINI 50 MG/ML injection INSERT 1 MINI CARTRIDGE INTO AUTOINJECTOR AND INJECT 50 MG UNDER THE SKIN EVERY 7 DAYS 12 mL 0   gabapentin (NEURONTIN) 100 MG capsule Take 3 capsules (300 mg total) by mouth at bedtime. TAKE 3 CAPSULES(300 MG) BY MOUTH AT BEDTIME 270 capsule 3   Galcanezumab-gnlm (EMGALITY) 120 MG/ML SOAJ ADMINISTER 1 ML UNDER THE SKIN EVERY 30 DAYS 3 mL 4   guaiFENesin (MUCINEX) 600 MG 12 hr tablet Take 2 tablets (1,200 mg total) by mouth 2 (two) times daily as needed for cough or to loosen phlegm. (Patient taking differently: Take 1,200 mg by mouth 2 (two) times  daily.)     meclizine (ANTIVERT) 25 MG tablet Take 1 tablet (25 mg total) by mouth 3 (three) times daily as needed for dizziness. 30 tablet 0   metFORMIN (GLUCOPHAGE) 500 MG tablet TAKE 1 TABLET(500 MG) BY MOUTH TWICE DAILY WITH A MEAL 180 tablet 1   methocarbamol (ROBAXIN) 500 MG tablet Take 1 tablet (500 mg total) by mouth every 8 (eight) hours as needed. 60 tablet 3   Multiple Vitamins-Minerals (MULTIVITAMIN GUMMIES ADULT PO) Take 2 each by mouth daily.     Rimegepant Sulfate (NURTEC) 75 MG TBDP DISSOLVE ONE TABLET BY MOUTH DAILY AS NEEDED FOR MIGRAINES. TAKE AS CLOSE TO ONSET OF MIGRAINE AS POSSIBLE. 1 TABLET DAILY MAXIMUM. 15 tablet 11   sodium chloride HYPERTONIC 3 % nebulizer solution Take 3 mL Twice daily via nebulizer as needed for cough/congestion 180 mL 11   telmisartan (MICARDIS) 20 MG tablet TAKE 1 TABLET(20 MG) BY MOUTH DAILY 90 tablet 1   TRELEGY ELLIPTA 100-62.5-25 MCG/ACT AEPB INHALE 1 PUFF INTO THE LUNGS DAILY 60 each 5   No current facility-administered medications on file prior to visit.    Review of Systems     Objective:  There were no vitals filed for this visit. BP  Readings from Last 3 Encounters:  07/09/23 127/81  07/01/23 134/70  05/31/23 116/68   Wt Readings from Last 3 Encounters:  07/09/23 (!) 305 lb (138.3 kg)  07/01/23 (!) 305 lb 3.2 oz (138.4 kg)  05/31/23 (!) 306 lb (138.8 kg)   There is no height or weight on file to calculate BMI.    Physical Exam         Assessment & Plan:    See Problem List for Assessment and Plan of chronic medical problems.

## 2023-07-15 ENCOUNTER — Ambulatory Visit: Payer: Federal, State, Local not specified - PPO | Admitting: Internal Medicine

## 2023-07-15 VITALS — BP 136/72 | HR 88 | Temp 98.3°F | Ht 68.0 in | Wt 307.0 lb

## 2023-07-15 DIAGNOSIS — Z6841 Body Mass Index (BMI) 40.0 and over, adult: Secondary | ICD-10-CM

## 2023-07-15 MED ORDER — TIRZEPATIDE-WEIGHT MANAGEMENT 2.5 MG/0.5ML ~~LOC~~ SOLN: 2.5000 mg | SUBCUTANEOUS | 0 refills | Status: DC

## 2023-07-15 NOTE — Patient Instructions (Addendum)
         Medications changes include :   zepbound 2.5 mg weekly       Return for follow up as scheduled.

## 2023-07-15 NOTE — Assessment & Plan Note (Signed)
Chronic Has several comorbidities including fibromuscular dysplasia, bronchiectasis on oxygen therapy, hypertension, sleep apnea, polymyositis, prediabetes, rheumatoid arthritis, osteoarthritis Would benefit greatly from weight loss-she is making effort, but needs help Start Zepbound 2.5 mg weekly-discussed possible side effects including nausea, GERD, constipation, diarrhea, rare risk of pancreatitis, vision issues, gastroparesis Titrate slowly

## 2023-07-16 DIAGNOSIS — F4310 Post-traumatic stress disorder, unspecified: Secondary | ICD-10-CM | POA: Diagnosis not present

## 2023-07-17 ENCOUNTER — Ambulatory Visit (HOSPITAL_BASED_OUTPATIENT_CLINIC_OR_DEPARTMENT_OTHER): Payer: Federal, State, Local not specified - PPO | Admitting: Internal Medicine

## 2023-07-17 DIAGNOSIS — J849 Interstitial pulmonary disease, unspecified: Secondary | ICD-10-CM | POA: Diagnosis not present

## 2023-07-17 LAB — PULMONARY FUNCTION TEST
DL/VA % pred: 124 %
DL/VA: 5.12 ml/min/mmHg/L
DLCO cor % pred: 91 %
DLCO cor: 21.32 ml/min/mmHg
DLCO unc % pred: 88 %
DLCO unc: 20.71 ml/min/mmHg
FEF 25-75 Post: 3.42 L/s
FEF 25-75 Pre: 3.37 L/s
FEF2575-%Change-Post: 1 %
FEF2575-%Pred-Post: 125 %
FEF2575-%Pred-Pre: 123 %
FEV1-%Change-Post: 0 %
FEV1-%Pred-Post: 80 %
FEV1-%Pred-Pre: 80 %
FEV1-Post: 2.43 L
FEV1-Pre: 2.43 L
FEV1FVC-%Change-Post: 4 %
FEV1FVC-%Pred-Pre: 109 %
FEV6-%Change-Post: -4 %
FEV6-%Pred-Post: 71 %
FEV6-%Pred-Pre: 75 %
FEV6-Post: 2.7 L
FEV6-Pre: 2.84 L
FEV6FVC-%Pred-Post: 103 %
FEV6FVC-%Pred-Pre: 103 %
FVC-%Change-Post: -4 %
FVC-%Pred-Post: 69 %
FVC-%Pred-Pre: 72 %
FVC-Post: 2.7 L
FVC-Pre: 2.84 L
Post FEV1/FVC ratio: 90 %
Post FEV6/FVC ratio: 100 %
Pre FEV1/FVC ratio: 86 %
Pre FEV6/FVC Ratio: 100 %
RV % pred: 75 %
RV: 1.61 L
TLC % pred: 81 %
TLC: 4.61 L

## 2023-07-17 NOTE — Patient Instructions (Signed)
Full PFT Performed Today  

## 2023-07-17 NOTE — Progress Notes (Signed)
Full PFT Performed Today  

## 2023-07-19 ENCOUNTER — Other Ambulatory Visit (HOSPITAL_COMMUNITY): Payer: Self-pay

## 2023-07-19 ENCOUNTER — Encounter: Payer: Self-pay | Admitting: Internal Medicine

## 2023-07-19 ENCOUNTER — Telehealth: Payer: Self-pay

## 2023-07-19 NOTE — Telephone Encounter (Signed)
Pharmacy Patient Advocate Encounter   Received notification from CoverMyMeds that prior authorization for Zepbound 2.5MG /0.5ML pen-injectors is required/requested.   Insurance verification completed.   The patient is insured through [facility] .   Per test claim: PA required; PA submitted to above mentioned insurance via CoverMyMeds Key/confirmation #/EOC [id number] Status is pending

## 2023-07-22 ENCOUNTER — Telehealth: Payer: Self-pay

## 2023-07-22 ENCOUNTER — Other Ambulatory Visit (HOSPITAL_COMMUNITY): Payer: Self-pay

## 2023-07-23 DIAGNOSIS — F4310 Post-traumatic stress disorder, unspecified: Secondary | ICD-10-CM | POA: Diagnosis not present

## 2023-07-25 ENCOUNTER — Other Ambulatory Visit (HOSPITAL_COMMUNITY): Payer: Self-pay

## 2023-07-26 ENCOUNTER — Other Ambulatory Visit (HOSPITAL_COMMUNITY): Payer: Self-pay

## 2023-07-26 NOTE — Telephone Encounter (Signed)
Copied from CRM [id number]. Topic: Clinical - Prescription Issue >> Jul 26, 2023  3:51 PM Jill Harper wrote:  Reason for CRM: Patient states that [employer] is needing a criteria form sent over for patient prescription for Zepbound. They also advised her to make sure everything is completed on it and submitted as soon as possible because they have to close it out soon.

## 2023-07-29 DIAGNOSIS — G4733 Obstructive sleep apnea (adult) (pediatric): Secondary | ICD-10-CM | POA: Diagnosis not present

## 2023-07-29 DIAGNOSIS — J471 Bronchiectasis with (acute) exacerbation: Secondary | ICD-10-CM | POA: Diagnosis not present

## 2023-08-01 ENCOUNTER — Encounter: Payer: Self-pay | Admitting: Internal Medicine

## 2023-08-02 ENCOUNTER — Encounter: Payer: Self-pay | Admitting: Radiology

## 2023-08-02 NOTE — Telephone Encounter (Signed)
Pharmacy Patient Advocate Encounter  Received notification from [facility] that Prior Authorization for Zepbound 2.5 MG/0.5ML INJECTION has been DENIED.  Full denial letter will be uploaded to the media tab. See denial reason below.

## 2023-08-05 ENCOUNTER — Telehealth: Payer: Self-pay

## 2023-08-05 DIAGNOSIS — F4310 Post-traumatic stress disorder, unspecified: Secondary | ICD-10-CM | POA: Diagnosis not present

## 2023-08-05 NOTE — Telephone Encounter (Signed)
Resend script for Zepbound on Thursday

## 2023-08-12 ENCOUNTER — Other Ambulatory Visit: Payer: Self-pay | Admitting: Adult Health

## 2023-08-13 DIAGNOSIS — F4310 Post-traumatic stress disorder, unspecified: Secondary | ICD-10-CM | POA: Diagnosis not present

## 2023-08-14 ENCOUNTER — Ambulatory Visit: Payer: Federal, State, Local not specified - PPO | Admitting: Internal Medicine

## 2023-08-20 DIAGNOSIS — F4310 Post-traumatic stress disorder, unspecified: Secondary | ICD-10-CM | POA: Diagnosis not present

## 2023-08-25 ENCOUNTER — Other Ambulatory Visit: Payer: Self-pay | Admitting: Nurse Practitioner

## 2023-08-27 DIAGNOSIS — F4310 Post-traumatic stress disorder, unspecified: Secondary | ICD-10-CM | POA: Diagnosis not present

## 2023-08-27 DIAGNOSIS — J849 Interstitial pulmonary disease, unspecified: Secondary | ICD-10-CM | POA: Diagnosis not present

## 2023-08-27 DIAGNOSIS — J479 Bronchiectasis, uncomplicated: Secondary | ICD-10-CM | POA: Diagnosis not present

## 2023-08-27 DIAGNOSIS — G4733 Obstructive sleep apnea (adult) (pediatric): Secondary | ICD-10-CM | POA: Diagnosis not present

## 2023-08-27 DIAGNOSIS — J4540 Moderate persistent asthma, uncomplicated: Secondary | ICD-10-CM | POA: Insufficient documentation

## 2023-08-27 NOTE — Progress Notes (Unsigned)
Subjective:    Patient ID: Jill Harper, female    DOB: 11/14/1965, 58 y.o.   MRN: [id number]     HPI Jill Harper is here for follow up of her chronic medical problems.  Should have A1c, tsh, vit d  Medications and allergies reviewed with patient and updated if appropriate.  Current Outpatient Medications on File Prior to Visit  Medication Sig Dispense Refill   albuterol (PROVENTIL) (2.5 MG/3ML) 0.083% nebulizer solution Take 3 mLs (2.5 mg total) by nebulization every 6 (six) hours as needed for wheezing or shortness of breath. 120 vial 5   albuterol (VENTOLIN HFA) 108 (90 Base) MCG/ACT inhaler INHALE 2 PUFFS BY MOUTH INTO THE LUNGS FOUR TIMES DAILY AS NEEDED 8.5 g 1   alendronate (FOSAMAX) 70 MG tablet Take 1 tablet by mouth once a week.     aspirin EC 81 MG tablet Take 81 mg by mouth daily.     atenolol (TENORMIN) 25 MG tablet TAKE 1 TABLET(25 MG) BY MOUTH DAILY 90 tablet 2   azaTHIOprine (IMURAN) 50 MG tablet TAKE 2 TABLETS BY MOUTH DAILY 180 tablet 0   Cholecalciferol (VITAMIN D) 50 MCG (2000 UT) CAPS Take by mouth.     diclofenac Sodium (VOLTAREN) 1 % GEL APPLY 2 TO 4 GRAMS TOPICALLY TO AFFECTED JOINT FOUR TIMES DAILY AS NEEDED 400 g 2   DULoxetine (CYMBALTA) 30 MG capsule TAKE ONE CAPSULE BY MOUTH DAILY FOR TOAL OF 90 MG DAILY 30 capsule 3   DULoxetine (CYMBALTA) 60 MG capsule TAKE 1 CAPSULE(60 MG) BY MOUTH DAILY 90 capsule 1   ENBREL MINI 50 MG/ML injection INSERT 1 MINI CARTRIDGE INTO AUTOINJECTOR AND INJECT 50 MG UNDER THE SKIN EVERY 7 DAYS 12 mL 0   gabapentin (NEURONTIN) 100 MG capsule Take 3 capsules (300 mg total) by mouth at bedtime. TAKE 3 CAPSULES(300 MG) BY MOUTH AT BEDTIME 270 capsule 3   Galcanezumab-gnlm (EMGALITY) 120 MG/ML SOAJ ADMINISTER 1 ML UNDER THE SKIN EVERY 30 DAYS 3 mL 4   guaiFENesin (MUCINEX) 600 MG 12 hr tablet Take 2 tablets (1,200 mg total) by mouth 2 (two) times daily as needed for cough or to loosen phlegm. (Patient taking differently: Take 1,200 mg  by mouth 2 (two) times daily.)     meclizine (ANTIVERT) 25 MG tablet Take 1 tablet (25 mg total) by mouth 3 (three) times daily as needed for dizziness. 30 tablet 0   metFORMIN (GLUCOPHAGE) 500 MG tablet TAKE 1 TABLET(500 MG) BY MOUTH TWICE DAILY WITH A MEAL 180 tablet 1   methocarbamol (ROBAXIN) 500 MG tablet Take 1 tablet (500 mg total) by mouth every 8 (eight) hours as needed. 60 tablet 3   Multiple Vitamins-Minerals (MULTIVITAMIN GUMMIES ADULT PO) Take 2 each by mouth daily.     Rimegepant Sulfate (NURTEC) 75 MG TBDP DISSOLVE ONE TABLET BY MOUTH DAILY AS NEEDED FOR MIGRAINES. TAKE AS CLOSE TO ONSET OF MIGRAINE AS POSSIBLE. 1 TABLET DAILY MAXIMUM. 15 tablet 11   sodium chloride HYPERTONIC 3 % nebulizer solution Take 3 mL Twice daily via nebulizer as needed for cough/congestion 180 mL 11   telmisartan (MICARDIS) 20 MG tablet TAKE 1 TABLET(20 MG) BY MOUTH DAILY 90 tablet 1   tirzepatide (ZEPBOUND) 2.5 MG/0.5ML injection vial Inject 2.5 mg into the skin once a week. 2 mL 0   TRELEGY ELLIPTA 100-62.5-25 MCG/ACT AEPB INHALE 1 PUFF INTO THE LUNGS DAILY 60 each 5   No current facility-administered medications on file prior to  visit.     Review of Systems     Objective:  There were no vitals filed for this visit. BP Readings from Last 3 Encounters:  07/15/23 136/72  07/09/23 127/81  07/01/23 134/70   Wt Readings from Last 3 Encounters:  07/17/23 297 lb 9.6 oz (135 kg)  07/15/23 (!) 307 lb (139.3 kg)  07/09/23 (!) 305 lb (138.3 kg)   There is no height or weight on file to calculate BMI.    Physical Exam     Lab Results  Component Value Date   WBC 8.1 06/19/2023   HGB 12.5 06/19/2023   HCT 37.9 06/19/2023   PLT 267 06/19/2023   GLUCOSE 107 (H) 06/19/2023   CHOL 123 09/18/2022   TRIG 88.0 09/18/2022   HDL 38.90 (L) 09/18/2022   LDLCALC 67 09/18/2022   ALT 13 06/19/2023   AST 15 06/19/2023   NA 139 06/19/2023   K 4.6 06/19/2023   CL 105 06/19/2023   CREATININE 0.61  06/19/2023   BUN 10 06/19/2023   CO2 28 06/19/2023   TSH 4.32 09/18/2022   INR 1.03 09/18/2018   HGBA1C 5.9 09/18/2022     Assessment & Plan:    See Problem List for Assessment and Plan of chronic medical problems.

## 2023-08-27 NOTE — Patient Instructions (Incomplete)
      Blood work was ordered.       Medications changes include :   None    A referral was ordered and someone will call you to schedule an appointment.     No follow-ups on file.

## 2023-08-28 ENCOUNTER — Ambulatory Visit: Payer: Federal, State, Local not specified - PPO | Admitting: Internal Medicine

## 2023-08-28 ENCOUNTER — Encounter: Payer: Self-pay | Admitting: Internal Medicine

## 2023-08-28 VITALS — BP 112/60 | HR 73 | Temp 98.1°F | Resp 18 | Ht 68.0 in | Wt 311.0 lb

## 2023-08-28 DIAGNOSIS — I341 Nonrheumatic mitral (valve) prolapse: Secondary | ICD-10-CM

## 2023-08-28 DIAGNOSIS — E038 Other specified hypothyroidism: Secondary | ICD-10-CM | POA: Diagnosis not present

## 2023-08-28 DIAGNOSIS — F419 Anxiety disorder, unspecified: Secondary | ICD-10-CM

## 2023-08-28 DIAGNOSIS — I10 Essential (primary) hypertension: Secondary | ICD-10-CM

## 2023-08-28 DIAGNOSIS — Z6841 Body Mass Index (BMI) 40.0 and over, adult: Secondary | ICD-10-CM

## 2023-08-28 DIAGNOSIS — I773 Arterial fibromuscular dysplasia: Secondary | ICD-10-CM | POA: Diagnosis not present

## 2023-08-28 DIAGNOSIS — R7303 Prediabetes: Secondary | ICD-10-CM

## 2023-08-28 DIAGNOSIS — E559 Vitamin D deficiency, unspecified: Secondary | ICD-10-CM

## 2023-08-28 DIAGNOSIS — M797 Fibromyalgia: Secondary | ICD-10-CM

## 2023-08-28 NOTE — Assessment & Plan Note (Signed)
Chronic Has chronic pain, associated brain fog-symptoms varied in intensity Continue Cymbalta 90 mg daily, gabapentin 300 mg at bedtime Stressed regular exercise

## 2023-08-28 NOTE — Assessment & Plan Note (Signed)
Chronic Lab Results  Component Value Date   HGBA1C 5.9 09/18/2022    Low sugar / carb diet Stressed regular exercise

## 2023-08-28 NOTE — Assessment & Plan Note (Addendum)
Chronic Taking vitamin D daily 

## 2023-08-28 NOTE — Assessment & Plan Note (Signed)
Chronic Has several comorbidities including fibromuscular dysplasia, bronchiectasis on oxygen therapy, hypertension, sleep apnea, polymyositis, prediabetes, rheumatoid arthritis, osteoarthritis She wants to work on weight loss Currently not exercising-that she needs to start exercising-discussed videos online if she did not want to go to the gym, walking inside the house She is eating fairly healthy and has improved her diet recently Discussed that she needs to decrease her calorie intake in order to promote weight loss, but continue to eat a healthy diet

## 2023-08-28 NOTE — Assessment & Plan Note (Addendum)
Chronic controlled Has more good days than bad days - can use her techniques to help with the anxiety Continue duloxetine 90 mg daily Speaking with a therapist once a week

## 2023-08-28 NOTE — Assessment & Plan Note (Addendum)
Chronic Blood pressure well-controlled CBC, CMP Continue current medications-atenolol 25 mg daily, telmisartan 20 mg daily

## 2023-08-28 NOTE — Assessment & Plan Note (Addendum)
Chronic Occasional palpitations or pinching like pain Occasionally has pain - occurred last Friday evening - lasted until early Saturday morning Continue atenolol 25 mg daily-can take an extra dose if needed

## 2023-08-28 NOTE — Assessment & Plan Note (Signed)
Chronic  Following with Dr Maudie Mercury in [place]

## 2023-08-28 NOTE — Assessment & Plan Note (Signed)
Chronic Elevated TSH, normal free T4, free T3 and thyroid antibodies Check TFTs she is asymptomatic so we will continue to monitor If she becomes symptomatic or continues to have subclinical hypothyroidism we will consider starting levothyroxine She does have clinical osteoporosis Recheck TFTs in 3 months

## 2023-08-29 DIAGNOSIS — G4733 Obstructive sleep apnea (adult) (pediatric): Secondary | ICD-10-CM | POA: Diagnosis not present

## 2023-08-29 DIAGNOSIS — J471 Bronchiectasis with (acute) exacerbation: Secondary | ICD-10-CM | POA: Diagnosis not present

## 2023-09-09 ENCOUNTER — Other Ambulatory Visit (INDEPENDENT_AMBULATORY_CARE_PROVIDER_SITE_OTHER): Payer: Federal, State, Local not specified - PPO

## 2023-09-09 DIAGNOSIS — I10 Essential (primary) hypertension: Secondary | ICD-10-CM

## 2023-09-09 DIAGNOSIS — E038 Other specified hypothyroidism: Secondary | ICD-10-CM

## 2023-09-09 DIAGNOSIS — R7303 Prediabetes: Secondary | ICD-10-CM | POA: Diagnosis not present

## 2023-09-09 LAB — COMPREHENSIVE METABOLIC PANEL
ALT: 13 U/L (ref 0–35)
AST: 18 U/L (ref 0–37)
Albumin: 3.9 g/dL (ref 3.5–5.2)
Alkaline Phosphatase: 45 U/L (ref 39–117)
BUN: 11 mg/dL (ref 6–23)
CO2: 27 meq/L (ref 19–32)
Calcium: 9 mg/dL (ref 8.4–10.5)
Chloride: 104 meq/L (ref 96–112)
Creatinine, Ser: 0.63 mg/dL (ref 0.40–1.20)
GFR: 98.19 mL/min (ref >60.00)
Glucose, Bld: 120 mg/dL — ABNORMAL HIGH (ref 70–99)
Potassium: 4.2 meq/L (ref 3.5–5.1)
Sodium: 136 meq/L (ref 135–145)
Total Bilirubin: 0.4 mg/dL (ref 0.2–1.2)
Total Protein: 7.5 g/dL (ref 6.0–8.3)

## 2023-09-09 LAB — LIPID PANEL
Cholesterol: 120 mg/dL (ref 0–200)
HDL: 39.2 mg/dL (ref >39.00)
LDL Cholesterol: 63 mg/dL (ref 0–99)
NonHDL: 80.57
Total CHOL/HDL Ratio: 3
Triglycerides: 88 mg/dL (ref 0.0–149.0)
VLDL: 17.6 mg/dL (ref 0.0–40.0)

## 2023-09-09 LAB — TSH: TSH: 3.83 u[IU]/mL (ref 0.35–5.50)

## 2023-09-09 LAB — HEMOGLOBIN A1C: Hgb A1c MFr Bld: 6.2 % (ref 4.6–6.5)

## 2023-09-09 LAB — T4, FREE: Free T4: 0.67 ng/dL (ref 0.60–1.60)

## 2023-09-09 LAB — T3, FREE: T3, Free: 3.2 pg/mL (ref 2.3–4.2)

## 2023-09-10 ENCOUNTER — Encounter: Payer: Self-pay | Admitting: Internal Medicine

## 2023-09-10 DIAGNOSIS — F4310 Post-traumatic stress disorder, unspecified: Secondary | ICD-10-CM | POA: Diagnosis not present

## 2023-09-17 DIAGNOSIS — F4310 Post-traumatic stress disorder, unspecified: Secondary | ICD-10-CM | POA: Diagnosis not present

## 2023-09-21 ENCOUNTER — Other Ambulatory Visit: Payer: Self-pay | Admitting: Physician Assistant

## 2023-09-23 NOTE — Telephone Encounter (Signed)
Last Fill: 06/24/2023  Labs: 06/19/2023 CMP normal, CK normal CBC WNL   Next Visit: 10/08/2023  Last Visit: 07/09/2023  DX: Rheumatoid arthritis involving multiple sites with positive rheumatoid factor   Current Dose per office note 07/09/2023: Imuran 100 mg by mouth daily   Patient to update labs at upcoming appointment on 10/08/2023  Okay to refill Imuran?

## 2023-09-24 NOTE — Progress Notes (Deleted)
 Office Visit Note  Patient: Jill Harper             Date of Birth: February 18, 1966           MRN: [id number]             PCP: Pincus Sanes, MD Referring: Pincus Sanes, MD Visit Date: 10/08/2023 Occupation: @GUAROCC @  Subjective:    History of Present Illness: CARNELL CASAMENTO is a 58 y.o. female with history of seropositive rheumatoid arthritis, polymyositis, and ILD.  Patient remains on Enbrel 50 mg sq injections once weekly (02/18/20-1st injection) and Imuran 100 mg by mouth daily.   CMP updated on 09/09/23.  CBC updated on 06/19/23.  TB gold negative on 02/26/23.  Discussed the importance of holding enbrel and imurna if she develops signs or symptoms of an infection and to resume once the infection has completely cleared.    Activities of Daily Living:  Patient reports morning stiffness for *** {minute/hour:19697}.   Patient {ACTIONS;DENIES/REPORTS:21021675::"Denies"} nocturnal pain.  Difficulty dressing/grooming: {ACTIONS;DENIES/REPORTS:21021675::"Denies"} Difficulty climbing stairs: {ACTIONS;DENIES/REPORTS:21021675::"Denies"} Difficulty getting out of chair: {ACTIONS;DENIES/REPORTS:21021675::"Denies"} Difficulty using hands for taps, buttons, cutlery, and/or writing: {ACTIONS;DENIES/REPORTS:21021675::"Denies"}  No Rheumatology ROS completed.   PMFS History:  Patient Active Problem List   Diagnosis Date Noted   Subclinical hypothyroidism 05/31/2023   Rib pain on left side 01/08/2023   Back pain 11/21/2022   Left arm pain 09/13/2022   Left arm swelling 09/13/2022   Right upper quadrant pain 08/15/2022   Palpitations-related to MVP 04/02/2022   Nocturnal hypoxia 01/16/2022   Fibromuscular dysplasia of wall of intracranial artery (HCC) 12/20/2021   Anxiety 09/06/2021   Tingling 06/28/2021   Occipital neuralgia of left side 01/06/2020   Metabolic syndrome X 07/28/2019   Entrapment of left ulnar nerve 07/27/2019   Right hand pain 07/27/2019   Bilateral carpal tunnel syndrome  07/21/2019   Costochondritis 03/16/2019   Rib pain on right side 03/16/2019   Prediabetes 11/27/2018   Fibromuscular dysplasia (HCC) 10/17/2018   Hypertension 10/17/2018   Bronchiectasis with (acute) exacerbation (HCC) 10/08/2018   OSA (obstructive sleep apnea) 08/20/2018   Rheumatoid arthritis (HCC) - Dr Corliss Skains 04/17/2018   ILD (interstitial lung disease) (HCC) 04/17/2018   Dyspnea 04/01/2018   Bronchiectasis (HCC) 09/02/2017   LVH (left ventricular hypertrophy), mild 06/02/2017   Mitral regurgitation 05/22/2017   Jo-1 antibody positive 01/29/2017   Primary osteoarthritis of both feet 01/29/2017   Primary osteoarthritis of both hands 01/29/2017   Left shoulder tendonitis 01/17/2017   Vitamin D deficiency 04/26/2016   Morbid obesity with BMI of 45.0-49.9, adult (HCC) 03/26/2016   Cough 07/18/2015   MVP (mitral valve prolapse)    Erythema nodosum    INCONTINENCE, URGE 05/26/2010   Migraine headache 02/15/2010   Fibromyalgia 06/20/2009   Vertigo 03/05/2008   Polymyositis (HCC) 02/11/2008   BRONCHIECTASIS 02/10/2008    Past Medical History:  Diagnosis Date   Abnormal liver function test    Bronchiectasis        Dyspnea    Fibromuscular dysplasia (HCC)    Fibromyalgia    Heart murmur    ILD (interstitial lung disease) (HCC)    Joint pain    Menorrhagia    MIGRAINE HEADACHE    MVP (mitral valve prolapse)    Osteoarthritis    Polymyositis (HCC)    Dr [name]; chronic MTX   Pulmonary fibrosis (HCC)    Rheumatoid arthritis (HCC)    Sleep apnea    Vertigo  Vitamin B 12 deficiency    Vitamin D deficiency     Family History  Problem Relation Age of Onset   Diabetes Mother    Fibromyalgia Mother    Ulcers Mother    Heart failure Mother    Thyroid disease Mother    Obesity Mother    Multiple myeloma Father    Hypertension Father    Stroke Father    Lupus Sister    Other Sister        abdominal adhesions resulting in bowel obstruction   Migraines  Sister    Heart disease Brother        bypass surgery   Rheum arthritis Sister    Multiple myeloma Sister    Hypertension Sister    Heart attack Sister    Diabetes Sister    Hypertension Sister    Rheum arthritis Sister    Diabetes Sister    Diabetes Brother    Headache Other        siblings with headaches but not diagnosed as migraines   Past Surgical History:  Procedure Laterality Date   ablation uterine  2010   CARDIAC CATHETERIZATION  2002   normal   DILATION AND CURETTAGE OF UTERUS  08-31-08   Dr Zelphia Cairo   IR ANGIO INTRA EXTRACRAN SEL COM CAROTID INNOMINATE BILAT MOD SED  09/18/2018   IR ANGIO VERTEBRAL SEL VERTEBRAL BILAT MOD SED  09/18/2018   IR US GUIDE VASC ACCESS RIGHT  09/18/2018   Social History   Social History Narrative   Exercise: trying to walk - limited by fatigue   Lives at home with husband    Right handed   Caffeine: none    Immunization History  Administered Date(s) Administered   Influenza Split 04/10/2011, 06/18/2012, 05/06/2013   Influenza Whole 05/19/2008, 05/15/2009, 06/01/2010   Influenza, Seasonal, Injecte, Preservative Fre 05/31/2023   Influenza,inj,Quad PF,6+ Mos 04/20/2014, 05/26/2015, 05/31/2016, 04/05/2017, 06/16/2018, 04/22/2019, 04/22/2020   Influenza-Unspecified 04/11/2021, 04/02/2022   PFIZER(Purple Top)SARS-COV-2 Vaccination 10/27/2019, 11/19/2019   Pneumococcal Conjugate-13 01/29/2017   Pneumococcal Polysaccharide-23 08/07/2007, 06/16/2018   Td 07/12/2009   Tdap 01/06/2020   Unspecified SARS-COV-2 Vaccination 10/27/2019, 11/19/2019, 04/04/2021   Zoster, Live 06/06/2022     Objective: Vital Signs: There were no vitals taken for this visit.   Physical Exam Vitals and nursing note reviewed.  Constitutional:      Appearance: She is well-developed.  HENT:     Head: Normocephalic and atraumatic.  Eyes:     Conjunctiva/sclera: Conjunctivae normal.  Cardiovascular:     Rate and Rhythm: Normal rate and regular rhythm.      Heart sounds: Normal heart sounds.  Pulmonary:     Effort: Pulmonary effort is normal.     Breath sounds: Normal breath sounds.  Abdominal:     General: Bowel sounds are normal.     Palpations: Abdomen is soft.  Musculoskeletal:     Cervical back: Normal range of motion.  Lymphadenopathy:     Cervical: No cervical adenopathy.  Skin:    General: Skin is warm and dry.     Capillary Refill: Capillary refill takes less than 2 seconds.  Neurological:     Mental Status: She is alert and oriented to person, place, and time.  Psychiatric:        Behavior: Behavior normal.      Musculoskeletal Exam: ***  CDAI Exam: CDAI Score: -- Patient Global: --; Provider Global: -- Swollen: --; Tender: -- Joint Exam 10/08/2023   No  joint exam has been documented for this visit   There is currently no information documented on the homunculus. Go to the Rheumatology activity and complete the homunculus joint exam.  Investigation: No additional findings.  Imaging: No results found.  Recent Labs: Lab Results  Component Value Date   WBC 8.1 06/19/2023   HGB 12.5 06/19/2023   PLT 267 06/19/2023   NA 136 09/09/2023   K 4.2 09/09/2023   CL 104 09/09/2023   CO2 27 09/09/2023   GLUCOSE 120 (H) 09/09/2023   BUN 11 09/09/2023   CREATININE 0.63 09/09/2023   BILITOT 0.4 09/09/2023   ALKPHOS 45 09/09/2023   AST 18 09/09/2023   ALT 13 09/09/2023   PROT 7.5 09/09/2023   ALBUMIN 3.9 09/09/2023   CALCIUM 9.0 09/09/2023   GFRAA 114 01/18/2021   QFTBGOLDPLUS NEGATIVE 02/26/2023    Speciality Comments: Prior therapy includes: MTX (d/c due to ILD progresssion)  Procedures:  No procedures performed Allergies: Sulfonamide derivatives   Assessment / Plan:     Visit Diagnoses: Rheumatoid arthritis involving multiple sites with positive rheumatoid factor (HCC)  High risk medication use  Polymyositis (HCC)  Jo-1 antibody positive  ILD (interstitial lung disease) (HCC)  Bronchiectasis  without complication (HCC)  Bilateral carpal tunnel syndrome  Raynaud's syndrome without gangrene  Primary osteoarthritis of both hands  Primary osteoarthritis of both feet  Fibromyalgia  Trapezius muscle spasm  Other fatigue  Erythema nodosum  History of mitral valve prolapse  History of migraine  Fibromuscular dysplasia (HCC)  Vitamin D deficiency  Orders: No orders of the defined types were placed in this encounter.  No orders of the defined types were placed in this encounter.   Face-to-face time spent with patient was *** minutes. Greater than 50% of time was spent in counseling and coordination of care.  Follow-Up Instructions: No follow-ups on file.   Gearldine Bienenstock, PA-C  Note - This record has been created using Dragon software.  Chart creation errors have been sought, but may not always  have been located. Such creation errors do not reflect on  the standard of medical care.

## 2023-09-25 ENCOUNTER — Other Ambulatory Visit: Payer: Self-pay | Admitting: Rheumatology

## 2023-09-25 DIAGNOSIS — M0579 Rheumatoid arthritis with rheumatoid factor of multiple sites without organ or systems involvement: Secondary | ICD-10-CM

## 2023-09-25 NOTE — Telephone Encounter (Signed)
Last Fill: 07/09/2023  Labs: 06/19/2023 CBC WNL CMP normal, CK normal   TB Gold: 02/26/2023 Neg    Next Visit: 10/08/2023  Last Visit: 07/09/2023  DX: Rheumatoid arthritis involving multiple sites with positive rheumatoid factor   Current Dose per office note 07/09/2023: Enbrel 50 mg sq injections once weekly   Patient to update her labs at upcoming appointment on 10/08/2023  Okay to refill Enbrel?

## 2023-09-29 DIAGNOSIS — J471 Bronchiectasis with (acute) exacerbation: Secondary | ICD-10-CM | POA: Diagnosis not present

## 2023-09-29 DIAGNOSIS — G4733 Obstructive sleep apnea (adult) (pediatric): Secondary | ICD-10-CM | POA: Diagnosis not present

## 2023-10-01 DIAGNOSIS — F4310 Post-traumatic stress disorder, unspecified: Secondary | ICD-10-CM | POA: Diagnosis not present

## 2023-10-03 ENCOUNTER — Ambulatory Visit: Payer: Self-pay | Admitting: Internal Medicine

## 2023-10-03 NOTE — Telephone Encounter (Signed)
 Copied from CRM [id number]. Topic: Clinical - Red Word Triage >> Oct 03, 2023  1:40 PM Gurney Maxin H wrote: [facility] that prompted transfer to Nurse Triage: Respiratory issues affecting her lungs and breathing, coughing up dark mucus, was having joint pain and body pain.  Chief Complaint: cough with dark tan phlegm Symptoms: sore throat on one side, back and joint pain/chest feels heavy Frequency: Monday Pertinent Negatives: Patient denies radiating chest pain or dizziness Disposition: [] ED /[] Urgent Care (no appt availability in office) / [x] Appointment(In office/virtual)/ []  [facility]/ [] Home Care/ [] Refused Recommended Disposition /[] [facility]/ []  Follow-up with PCP Additional Notes: appt made for in office visit- pt has h/o chronic lung disease  Reason for Disposition  [1] Known COPD or other severe lung disease (i.e., bronchiectasis, cystic fibrosis, lung surgery) AND [2] worsening symptoms (i.e., increased sputum purulence or amount, increased breathing difficulty  Answer Assessment - Initial Assessment Questions 1. ONSET: "When did the cough begin?"      Yesterday  2. SEVERITY: "How bad is the cough today?"      Not able to rest 3. SPUTUM: "Describe the color of your sputum" (none, dry cough; clear, white, yellow, green)     Dark tan  4. HEMOPTYSIS: "Are you coughing up any blood?" If so ask: "How much?" (flecks, streaks, tablespoons, etc.)     no 5. DIFFICULTY BREATHING: "Are you having difficulty breathing?" If Yes, ask: "How bad is it?" (e.g., mild, moderate, severe)    - MILD: No SOB at rest, mild SOB with walking, speaks normally in sentences, can lie down, no retractions, pulse < 100.    - MODERATE: SOB at rest, SOB with minimal exertion and prefers to sit, cannot lie down flat, speaks in phrases, mild retractions, audible wheezing, pulse 100-120.    - SEVERE: Very SOB at rest, speaks in single words, struggling to breathe, sitting hunched forward,  retractions, pulse > 120      No SOB only with exertion  6. FEVER: "Do you have a fever?" If Yes, ask: "What is your temperature, how was it measured, and when did it start?"     100.3 7. CARDIAC HISTORY: "Do you have any history of heart disease?" (e.g., heart attack, congestive heart failure)      MVP 8. LUNG HISTORY: "Do you have any history of lung disease?"  (e.g., pulmonary embolus, asthma, emphysema)     Interstitial lung disease 9. PE RISK FACTORS: "Do you have a history of blood clots?" (or: recent major surgery, recent prolonged travel, bedridden)     no 10. OTHER SYMPTOMS: "Do you have any other symptoms?" (e.g., runny nose, wheezing, chest pain)       Sore throat on one side, joint pain (RA arthritis) back spasms, Vest PT , chest feels heavy- back and front- non radiating - no chest pain  Protocols used: Cough - Acute Productive-A-AH

## 2023-10-04 ENCOUNTER — Encounter: Payer: Self-pay | Admitting: Internal Medicine

## 2023-10-04 ENCOUNTER — Ambulatory Visit (INDEPENDENT_AMBULATORY_CARE_PROVIDER_SITE_OTHER): Payer: Federal, State, Local not specified - PPO

## 2023-10-04 ENCOUNTER — Ambulatory Visit: Payer: Federal, State, Local not specified - PPO | Admitting: Internal Medicine

## 2023-10-04 VITALS — BP 126/74 | HR 100 | Temp 99.0°F | Ht 68.0 in | Wt 309.0 lb

## 2023-10-04 DIAGNOSIS — R051 Acute cough: Secondary | ICD-10-CM | POA: Diagnosis not present

## 2023-10-04 DIAGNOSIS — R062 Wheezing: Secondary | ICD-10-CM | POA: Diagnosis not present

## 2023-10-04 DIAGNOSIS — R059 Cough, unspecified: Secondary | ICD-10-CM | POA: Diagnosis not present

## 2023-10-04 DIAGNOSIS — R0602 Shortness of breath: Secondary | ICD-10-CM | POA: Diagnosis not present

## 2023-10-04 DIAGNOSIS — R918 Other nonspecific abnormal finding of lung field: Secondary | ICD-10-CM | POA: Diagnosis not present

## 2023-10-04 DIAGNOSIS — J9611 Chronic respiratory failure with hypoxia: Secondary | ICD-10-CM

## 2023-10-04 DIAGNOSIS — R6889 Other general symptoms and signs: Secondary | ICD-10-CM

## 2023-10-04 LAB — POC COVID19 BINAXNOW: SARS Coronavirus 2 Ag: NEGATIVE

## 2023-10-04 LAB — POCT INFLUENZA A/B
Influenza A, POC: NEGATIVE
Influenza B, POC: NEGATIVE

## 2023-10-04 MED ORDER — PREDNISONE 10 MG PO TABS: ORAL_TABLET | ORAL | 0 refills | Status: DC

## 2023-10-04 MED ORDER — LEVOFLOXACIN 500 MG PO TABS: 500.0000 mg | ORAL_TABLET | Freq: Every day | ORAL | 0 refills | Status: DC

## 2023-10-04 MED ORDER — HYDROCODONE BIT-HOMATROP MBR 5-1.5 MG/5ML PO SOLN: 5.0000 mL | Freq: Four times a day (QID) | ORAL | 0 refills | Status: AC | PRN

## 2023-10-04 NOTE — Patient Instructions (Signed)
 The Covid, Flu, and RSV testing is negative  Please take all new medication as prescribed - the antibiotic, cough medicine, and prednisone,  Please continue all other medications as before, and refills have been done if requested.  Please have the pharmacy call with any other refills you may need.  Please keep your appointments with your specialists as you may have planned  Please go to the XRAY Department in the first floor for the x-ray testing  You will be contacted by phone if any changes need to be made immediately.  Otherwise, you will receive a letter about your results with an explanation, but please check with MyChart first.

## 2023-10-04 NOTE — Progress Notes (Signed)
 Patient ID: Jill Harper, female   DOB: 1966/02/03, 58 y.o.   MRN: [id number]        Chief Complaint: follow up cough, wheezing, chronic resp failure       HPI:  Jill Harper is a 57 y.o. female Here with acute onset mild to mod 4 days ST, HA, general weakness and malaise, with prod cough greenish sputum, but Pt denies chest pain, increased sob or doe, wheezing, orthopnea, PND, increased LE swelling, palpitations, dizziness or syncope, except for mild worsening sob wheezing since 2 days.   Pt denies polydipsia, polyuria, or new focal neuro s/s.         Wt Readings from Last 3 Encounters:  10/04/23 (!) 309 lb (140.2 kg)  08/28/23 (!) 311 lb (141.1 kg)  07/17/23 297 lb 9.6 oz (135 kg)   BP Readings from Last 3 Encounters:  10/04/23 126/74  08/28/23 112/60  07/15/23 136/72         Past Medical History:  Diagnosis Date   Abnormal liver function test    Bronchiectasis        Dyspnea    Fibromuscular dysplasia (HCC)    Fibromyalgia    Heart murmur    ILD (interstitial lung disease) (HCC)    Joint pain    Menorrhagia    MIGRAINE HEADACHE    MVP (mitral valve prolapse)    Osteoarthritis    Polymyositis (HCC)    Dr [name]; chronic MTX   Pulmonary fibrosis (HCC)    Rheumatoid arthritis (HCC)    Sleep apnea    Vertigo    Vitamin B 12 deficiency    Vitamin D deficiency    Past Surgical History:  Procedure Laterality Date   ablation uterine  2010   CARDIAC CATHETERIZATION  2002   normal   DILATION AND CURETTAGE OF UTERUS  08-31-08   Dr Zelphia Cairo   IR ANGIO INTRA EXTRACRAN SEL COM CAROTID INNOMINATE BILAT MOD SED  09/18/2018   IR ANGIO VERTEBRAL SEL VERTEBRAL BILAT MOD SED  09/18/2018   IR US GUIDE VASC ACCESS RIGHT  09/18/2018    reports that she has never smoked. She has never been exposed to tobacco smoke. She has never used smokeless tobacco. She reports that she does not currently use alcohol. She reports that she does not use drugs. family history includes  Diabetes in her brother, mother, sister, and sister; Fibromyalgia in her mother; Headache in an other family member; Heart attack in her sister; Heart disease in her brother; Heart failure in her mother; Hypertension in her father, sister, and sister; Lupus in her sister; Migraines in her sister; Multiple myeloma in her father and sister; Obesity in her mother; Other in her sister; Rheum arthritis in her sister and sister; Stroke in her father; Thyroid disease in her mother; Ulcers in her mother. Allergies  Allergen Reactions   Sulfonamide Derivatives     REACTION: hives   Current Outpatient Medications on File Prior to Visit  Medication Sig Dispense Refill   albuterol (PROVENTIL) (2.5 MG/3ML) 0.083% nebulizer solution Take 3 mLs (2.5 mg total) by nebulization every 6 (six) hours as needed for wheezing or shortness of breath. 120 vial 5   albuterol (VENTOLIN HFA) 108 (90 Base) MCG/ACT inhaler INHALE 2 PUFFS BY MOUTH INTO THE LUNGS FOUR TIMES DAILY AS NEEDED 8.5 g 1   alendronate (FOSAMAX) 70 MG tablet Take 1 tablet by mouth once a week.     aspirin EC 81 MG tablet  Take 81 mg by mouth daily.     atenolol (TENORMIN) 25 MG tablet TAKE 1 TABLET(25 MG) BY MOUTH DAILY 90 tablet 2   azaTHIOprine (IMURAN) 50 MG tablet TAKE 2 TABLETS BY MOUTH DAILY 180 tablet 0   Cholecalciferol (VITAMIN D) 50 MCG (2000 UT) CAPS Take by mouth.     diclofenac Sodium (VOLTAREN) 1 % GEL APPLY 2 TO 4 GRAMS TOPICALLY TO AFFECTED JOINT FOUR TIMES DAILY AS NEEDED 400 g 2   DULoxetine (CYMBALTA) 30 MG capsule TAKE ONE CAPSULE BY MOUTH DAILY FOR TOAL OF 90 MG DAILY 30 capsule 3   DULoxetine (CYMBALTA) 60 MG capsule TAKE 1 CAPSULE(60 MG) BY MOUTH DAILY 90 capsule 1   ENBREL MINI 50 MG/ML injection INSERT 1 MINI CARTRIDGE INTO AUTOINJECTOR AND INJECT 50 MG UNDER THE SKIN EVERY 7 DAYS 4 mL 0   gabapentin (NEURONTIN) 100 MG capsule Take 3 capsules (300 mg total) by mouth at bedtime. TAKE 3 CAPSULES(300 MG) BY MOUTH AT BEDTIME 270  capsule 3   Galcanezumab-gnlm (EMGALITY) 120 MG/ML SOAJ ADMINISTER 1 ML UNDER THE SKIN EVERY 30 DAYS 3 mL 4   guaiFENesin (MUCINEX) 600 MG 12 hr tablet Take 2 tablets (1,200 mg total) by mouth 2 (two) times daily as needed for cough or to loosen phlegm. (Patient taking differently: Take 1,200 mg by mouth 2 (two) times daily.)     meclizine (ANTIVERT) 25 MG tablet Take 1 tablet (25 mg total) by mouth 3 (three) times daily as needed for dizziness. 30 tablet 0   metFORMIN (GLUCOPHAGE) 500 MG tablet TAKE 1 TABLET(500 MG) BY MOUTH TWICE DAILY WITH A MEAL 180 tablet 1   methocarbamol (ROBAXIN) 500 MG tablet Take 1 tablet (500 mg total) by mouth every 8 (eight) hours as needed. 60 tablet 3   Multiple Vitamins-Minerals (MULTIVITAMIN GUMMIES ADULT PO) Take 2 each by mouth daily.     Rimegepant Sulfate (NURTEC) 75 MG TBDP DISSOLVE ONE TABLET BY MOUTH DAILY AS NEEDED FOR MIGRAINES. TAKE AS CLOSE TO ONSET OF MIGRAINE AS POSSIBLE. 1 TABLET DAILY MAXIMUM. 15 tablet 11   sodium chloride HYPERTONIC 3 % nebulizer solution Take 3 mL Twice daily via nebulizer as needed for cough/congestion 180 mL 11   telmisartan (MICARDIS) 20 MG tablet TAKE 1 TABLET(20 MG) BY MOUTH DAILY 90 tablet 1   tirzepatide (ZEPBOUND) 2.5 MG/0.5ML injection vial Inject 2.5 mg into the skin once a week. 2 mL 0   TRELEGY ELLIPTA 100-62.5-25 MCG/ACT AEPB INHALE 1 PUFF INTO THE LUNGS DAILY 60 each 5   No current facility-administered medications on file prior to visit.        ROS:  All others reviewed and negative.  Objective        PE:  BP 126/74 (BP Location: Right Arm, Patient Position: Sitting, Cuff Size: Normal)   Pulse 100   Temp 99 F (37.2 C) (Oral)   Ht 5\' 8"  (1.727 m)   Wt (!) 309 lb (140.2 kg)   SpO2 99%   BMI 46.98 kg/m                 Constitutional: Pt appears mild ill               HENT: Head: NCAT.                Right Ear: External ear normal.                 Left Ear: External ear normal.  Eyes: .  Pupils are equal, round, and reactive to light. Conjunctivae and EOM are normal               Nose: without d/c or deformity               Neck: Neck supple. Gross normal ROM               Cardiovascular: Normal rate and regular rhythm.                 Pulmonary/Chest: Effort normal and breath sounds without rales but with mild bilat wheezing.                Abd:  Soft, NT, ND, + BS, no organomegaly               Neurological: Pt is alert. At baseline orientation, motor grossly intact               Skin: Skin is warm. No rashes, no other new lesions, LE edema - trace bilateral               Psychiatric: Pt behavior is normal without agitation   Micro: none  Cardiac tracings I have personally interpreted today:  none  Pertinent Radiological findings (summarize): none   Lab Results  Component Value Date   WBC 8.1 06/19/2023   HGB 12.5 06/19/2023   HCT 37.9 06/19/2023   PLT 267 06/19/2023   GLUCOSE 120 (H) 09/09/2023   CHOL 120 09/09/2023   TRIG 88.0 09/09/2023   HDL 39.20 09/09/2023   LDLCALC 63 09/09/2023   ALT 13 09/09/2023   AST 18 09/09/2023   NA 136 09/09/2023   K 4.2 09/09/2023   CL 104 09/09/2023   CREATININE 0.63 09/09/2023   BUN 11 09/09/2023   CO2 27 09/09/2023   TSH 3.83 09/09/2023   INR 1.03 09/18/2018   HGBA1C 6.2 09/09/2023   POCT - COVID - neg                 Flu - neg                RSV - neg  Assessment/Plan:  Jill Harper is a 58 y.o. Black or African American [2] female with  has a past medical history of Abnormal liver function test, Bronchiectasis, Dyspnea, Fibromuscular dysplasia (HCC), Fibromyalgia, Heart murmur, ILD (interstitial lung disease) (HCC), Joint pain, Menorrhagia, MIGRAINE HEADACHE, MVP (mitral valve prolapse), Osteoarthritis, Polymyositis (HCC), Pulmonary fibrosis (HCC), Rheumatoid arthritis (HCC), Sleep apnea, Vertigo, Vitamin B 12 deficiency, and Vitamin D deficiency.  Chronic hypoxemic respiratory failure (HCC) Overall stable, cont  home o2  Cough Mild to mod, for antibx course levaquin 500 every day,cough med prn, for cxr r/o pna,  to f/u any worsening symptoms or concerns  Wheezing Mild to mod, for prednisone taper,,  to f/u any worsening symptoms or concerns  Followup: Return if symptoms worsen or fail to improve.  Oliver Barre, MD 10/06/2023 8:53 PM [facility] Internal Medicine

## 2023-10-06 ENCOUNTER — Encounter: Payer: Self-pay | Admitting: Internal Medicine

## 2023-10-06 DIAGNOSIS — J9611 Chronic respiratory failure with hypoxia: Secondary | ICD-10-CM | POA: Insufficient documentation

## 2023-10-06 NOTE — Assessment & Plan Note (Signed)
 Mild to mod, for antibx course levaquin 500 every day,cough med prn, for cxr r/o pna,  to f/u any worsening symptoms or concerns

## 2023-10-06 NOTE — Assessment & Plan Note (Signed)
Mild to mod, for prednisone taper,,  to f/u any worsening symptoms or concerns  

## 2023-10-06 NOTE — Assessment & Plan Note (Signed)
 Overall stable, cont home o2

## 2023-10-07 NOTE — Progress Notes (Unsigned)
 Office Visit Note  Patient: Jill Harper             Date of Birth: Mar 30, 1966           MRN: [id number]             PCP: Pincus Sanes, MD Referring: Pincus Sanes, MD Visit Date: 10/17/2023 Occupation: @GUAROCC @  Subjective:  Recent infection  History of Present Illness: Jill Harper is a 58 y.o. female with history of seropositive rheumatoid arthritis, polymyositis, and ILD.  She is prescribed Enbrel 50 mg sq injections once weekly (02/18/20-1st injection) and Imuran 100 mg by mouth daily.  Patient reports that she was evaluated by Dr. Jonny Ruiz on 10/04/2023 for a sore throat, headache, generalized weakness, and a productive cough.  She was prescribed a 10-day course of Levaquin which she completed on Sunday.  Patient states that on Tuesday she was experiencing mild costochondritis so she held off on using her vest therapy that afternoon.  Patient states that otherwise she has been using the chest vest twice a day.  Patient states to help manage the costochondritis pain she took Aleve and alternated between using heat and ice on Tuesday.  Patient states that yesterday she tried using Voltaren gel and her symptoms have improved.  Patient states that while off of Imuran and Enbrel while recovering and taking Levaquin she noticed increased discomfort and stiffness in both hands and both knee joints.  Activities of Daily Living:  Patient reports morning stiffness for 5-10 minutes.   Patient Reports nocturnal pain.  Difficulty dressing/grooming: Reports Difficulty climbing stairs: Reports Difficulty getting out of chair: Denies Difficulty using hands for taps, buttons, cutlery, and/or writing: Reports  Review of Systems  Constitutional:  Positive for fatigue.  HENT:  Positive for mouth dryness. Negative for mouth sores.   Eyes:  Positive for dryness.  Respiratory:  Positive for shortness of breath.   Cardiovascular:  Negative for chest pain and palpitations.  Gastrointestinal:  Negative for  blood in stool, constipation and diarrhea.  Endocrine: Negative for increased urination.  Genitourinary:  Negative for involuntary urination.  Musculoskeletal:  Positive for joint pain, joint pain, joint swelling, myalgias, muscle weakness, morning stiffness, muscle tenderness and myalgias. Negative for gait problem.  Skin:  Positive for color change and sensitivity to sunlight. Negative for rash and hair loss.  Allergic/Immunologic: Positive for susceptible to infections.  Neurological:  Positive for headaches. Negative for dizziness.  Hematological:  Positive for swollen glands.  Psychiatric/Behavioral:  Negative for depressed mood and sleep disturbance. The patient is not nervous/anxious.     PMFS History:  Patient Active Problem List   Diagnosis Date Noted   Chronic hypoxemic respiratory failure (HCC) 10/06/2023   Wheezing 10/04/2023   Moderate persistent asthma without complication 08/27/2023   Subclinical hypothyroidism 05/31/2023   Osteoporosis 03/27/2023   Rib pain on left side 01/08/2023   Back pain 11/21/2022   Left arm pain 09/13/2022   Left arm swelling 09/13/2022   Right upper quadrant pain 08/15/2022   Palpitations-related to MVP 04/02/2022   Nocturnal hypoxia 01/16/2022   Fibromuscular dysplasia of wall of intracranial artery (HCC) 12/20/2021   Anxiety 09/06/2021   Tingling 06/28/2021   Occipital neuralgia of left side 01/06/2020   Metabolic syndrome X 07/28/2019   Entrapment of left ulnar nerve 07/27/2019   Right hand pain 07/27/2019   Bilateral carpal tunnel syndrome 07/21/2019   Costochondritis 03/16/2019   Rib pain on right side 03/16/2019   Prediabetes 11/27/2018  Fibromuscular dysplasia (HCC) 10/17/2018   Hypertension 10/17/2018   Bronchiectasis with (acute) exacerbation (HCC) 10/08/2018   OSA (obstructive sleep apnea) 08/20/2018   Rheumatoid arthritis (HCC) - Dr Corliss Skains 04/17/2018   ILD (interstitial lung disease) (HCC) 04/17/2018   Dyspnea  04/01/2018   Bronchiectasis (HCC) 09/02/2017   LVH (left ventricular hypertrophy), mild 06/02/2017   Mitral regurgitation 05/22/2017   Jo-1 antibody positive 01/29/2017   Primary osteoarthritis of both feet 01/29/2017   Primary osteoarthritis of both hands 01/29/2017   Left shoulder tendonitis 01/17/2017   Vitamin D deficiency 04/26/2016   Morbid obesity with BMI of 45.0-49.9, adult (HCC) 03/26/2016   Cough 07/18/2015   MVP (mitral valve prolapse)    Erythema nodosum    INCONTINENCE, URGE 05/26/2010   Migraine headache 02/15/2010   Fibromyalgia 06/20/2009   Vertigo 03/05/2008   Polymyositis (HCC) 02/11/2008   BRONCHIECTASIS 02/10/2008    Past Medical History:  Diagnosis Date   Abnormal liver function test    Bronchiectasis        Dyspnea    Fibromuscular dysplasia (HCC)    Fibromyalgia    Heart murmur    ILD (interstitial lung disease) (HCC)    Joint pain    Menorrhagia    MIGRAINE HEADACHE    MVP (mitral valve prolapse)    Osteoarthritis    Polymyositis (HCC)    Dr [name]; chronic MTX   Pulmonary fibrosis (HCC)    Rheumatoid arthritis (HCC)    Sleep apnea    Vertigo    Vitamin B 12 deficiency    Vitamin D deficiency     Family History  Problem Relation Age of Onset   Diabetes Mother    Fibromyalgia Mother    Ulcers Mother    Heart failure Mother    Thyroid disease Mother    Obesity Mother    Multiple myeloma Father    Hypertension Father    Stroke Father    Lupus Sister    Other Sister        abdominal adhesions resulting in bowel obstruction   Migraines Sister    Heart disease Brother        bypass surgery   Rheum arthritis Sister    Multiple myeloma Sister    Hypertension Sister    Heart attack Sister    Diabetes Sister    Hypertension Sister    Rheum arthritis Sister    Diabetes Sister    Diabetes Brother    Headache Other        siblings with headaches but not diagnosed as migraines   Past Surgical History:  Procedure Laterality  Date   ablation uterine  2010   CARDIAC CATHETERIZATION  2002   normal   DILATION AND CURETTAGE OF UTERUS  08-31-08   Dr Zelphia Cairo   IR ANGIO INTRA EXTRACRAN SEL COM CAROTID INNOMINATE BILAT MOD SED  09/18/2018   IR ANGIO VERTEBRAL SEL VERTEBRAL BILAT MOD SED  09/18/2018   IR US GUIDE VASC ACCESS RIGHT  09/18/2018   Social History   Social History Narrative   Exercise: trying to walk - limited by fatigue   Lives at home with husband    Right handed   Caffeine: none    Immunization History  Administered Date(s) Administered   Influenza Split 04/10/2011, 06/18/2012, 05/06/2013   Influenza Whole 05/19/2008, 05/15/2009, 06/01/2010   Influenza, Seasonal, Injecte, Preservative Fre 05/31/2023   Influenza,inj,Quad PF,6+ Mos 04/20/2014, 05/26/2015, 05/31/2016, 04/05/2017, 06/16/2018, 04/22/2019, 04/22/2020   Influenza-Unspecified 04/11/2021, 04/02/2022  PFIZER(Purple Top)SARS-COV-2 Vaccination 10/27/2019, 11/19/2019   Pneumococcal Conjugate-13 01/29/2017   Pneumococcal Polysaccharide-23 08/07/2007, 06/16/2018   Td 07/12/2009   Tdap 01/06/2020   Unspecified SARS-COV-2 Vaccination 10/27/2019, 11/19/2019, 04/04/2021   Zoster, Live 06/06/2022     Objective: Vital Signs: BP 124/80 (BP Location: Left Arm, Cuff Size: Large)   Pulse 93   Resp 12   Ht 5\' 8"  (1.727 m)   Wt (!) 312 lb 12.8 oz (141.9 kg)   BMI 47.56 kg/m    Physical Exam Vitals and nursing note reviewed.  Constitutional:      Appearance: She is well-developed.  HENT:     Head: Normocephalic and atraumatic.  Eyes:     Conjunctiva/sclera: Conjunctivae normal.  Cardiovascular:     Rate and Rhythm: Normal rate and regular rhythm.     Heart sounds: Normal heart sounds.  Pulmonary:     Effort: Pulmonary effort is normal.     Breath sounds: Normal breath sounds.  Abdominal:     General: Bowel sounds are normal.     Palpations: Abdomen is soft.  Musculoskeletal:     Cervical back: Normal range of motion.   Lymphadenopathy:     Cervical: No cervical adenopathy.  Skin:    General: Skin is warm and dry.     Capillary Refill: Capillary refill takes less than 2 seconds.  Neurological:     Mental Status: She is alert and oriented to person, place, and time.  Psychiatric:        Behavior: Behavior normal.      Musculoskeletal Exam: C-spine has good range of motion.  Shoulder joints have good range of motion with no discomfort.  Elbow joints, wrist joints, MCPs, PIPs, DIPs have good range of motion with no synovitis.  Complete fist formation bilaterally.  Hip joints have good range of motion with no discomfort.  Knee joints have good range of motion with no warmth or effusion.  Ankle joints have good range of motion with no tenderness or joint swelling.  CDAI Exam: CDAI Score: -- Patient Global: --; Provider Global: -- Swollen: --; Tender: -- Joint Exam 10/17/2023   No joint exam has been documented for this visit   There is currently no information documented on the homunculus. Go to the Rheumatology activity and complete the homunculus joint exam.  Investigation: No additional findings.  Imaging: No results found.  Recent Labs: Lab Results  Component Value Date   WBC 8.1 06/19/2023   HGB 12.5 06/19/2023   PLT 267 06/19/2023   NA 136 09/09/2023   K 4.2 09/09/2023   CL 104 09/09/2023   CO2 27 09/09/2023   GLUCOSE 120 (H) 09/09/2023   BUN 11 09/09/2023   CREATININE 0.63 09/09/2023   BILITOT 0.4 09/09/2023   ALKPHOS 45 09/09/2023   AST 18 09/09/2023   ALT 13 09/09/2023   PROT 7.5 09/09/2023   ALBUMIN 3.9 09/09/2023   CALCIUM 9.0 09/09/2023   GFRAA 114 01/18/2021   QFTBGOLDPLUS NEGATIVE 02/26/2023    Speciality Comments: Prior therapy includes: MTX (d/c due to ILD progresssion)  Procedures:  No procedures performed Allergies: Sulfonamide derivatives     Assessment / Plan:     Visit Diagnoses: Rheumatoid arthritis involving multiple sites with positive rheumatoid  factor (HCC) - +RF, +CCP: She has no synovitis on examination today.  She has been experiencing some increased pain and stiffness in both hands and both knee joints which she attributes to having a recent gap in therapy.  Patient was recently  treated with a 10-day course of Levaquin for a cough and wheezing prescribed by Dr. Jonny Ruiz.  Her symptoms have completely resolved and she is planning on resuming Enbrel and Imuran tomorrow.  She was advised to notify us if she develops any signs or symptoms of a flare.  She will follow-up in the office in 3 months or sooner if needed.  Association of heart disease with rheumatoid arthritis was discussed. Need to monitor blood pressure, cholesterol, and to exercise 30-60 minutes on daily basis was discussed.  Patient will be working with a nutritionist and is trying to follow a Mediterranean style diet.  She is also started chair yoga.  High risk medication use - Enbrel 50 mg sq injections once weekly (02/18/20-1st injection) and Imuran 100 mg by mouth daily. d/c MTX due to progressioin of ILD.  CMP updated on 09/09/23.  CBC 06/19/23.  Orders for CBC released today.  TB gold negative 02/26/23.  Lipid panel Wnl on 09/09/23.   Discussed the importance of holding Enbrel if she develops signs or symptoms of an infection and to resume once the infection has completely cleared.  - Plan: CBC with Differential/Platelet, COMPLETE METABOLIC PANEL WITH GFR  Polymyositis (HCC) - Dx by Dr. Kellie Simmering 2007, CK 801, Jo-1 positive: No signs or symptoms of a myositis flare.  Overall she is clinically been doing well on the current treatment regimen.  No medication changes will be made.  Plan to repeat CK today. - Plan: CK  Jo-1 antibody positive  ILD (interstitial lung disease) (HCC) - Followed by Dr. Thamas Jaegers, NP at [facility] pulm.  Completed pulmonary rehab. Stable.  Last HRCT March 2023 without disease progression.  Bronchiectasis without complication (HCC) - Followed by Dr.  Thamas Jaegers, NP: He is using vest therapy twice a day.  Bilateral carpal tunnel syndrome: Not currently symptomatic.   Raynaud's syndrome without gangrene: Intermittent.  No signs of sclerodactyly.  No digital ulcers.  Primary osteoarthritis of both hands: She has noticed some increased pain and stiffness involving both hands while holding Enbrel and Imuran but overall her symptoms have been well-controlled on the current treatment regimen.  No synovitis noted.   Primary osteoarthritis of both feet: She is not experiencing any increased discomfort in her feet at this time.  Fibromyalgia: Patient experiences intermittent myalgias and muscle tenderness due to fibromyalgia.  She has some trapezius muscle tension and tenderness bilaterally.  Patient recently had a bout of costochondritis which improved with the use of Aleve and alternating ice and heat.  Trapezius muscle spasm: Chronic trapezius muscle tension.  No muscle spasms currently.   Other fatigue: Chronic.  Patient has started chair yoga and has been making dietary changes for weight loss.   History of osteopenia - Followed by gynecologist.  She is taking fosamax 70 mg 1 tablet by mouth once weekly.  Vitamin D deficiency: She is taking vitamin D 2000 units daily.  Other medical conditions are listed as follows:  Other medical conditions are listed as follows:  Fibromuscular dysplasia (HCC) - multifocal fibromuscular dysplasia of the bilateral internal carotid arteries-confirmed by catheter-based angiography-Followed by Dr. Selena Batten.  Erythema nodosum  History of migraine  History of mitral valve prolapse  Orders: Orders Placed This Encounter  Procedures   CBC with Differential/Platelet   CK   COMPLETE METABOLIC PANEL WITH GFR   No orders of the defined types were placed in this encounter.  Follow-Up Instructions: Return in about 3 months (around 01/17/2024) for Rheumatoid arthritis.   Ladona Ridgel  Fransisca Kaufmann, PA-C  Note -  This record has been created using [employer].  Chart creation errors have been sought, but may not always  have been located. Such creation errors do not reflect on  the standard of medical care.

## 2023-10-08 ENCOUNTER — Ambulatory Visit: Payer: Federal, State, Local not specified - PPO | Admitting: Physician Assistant

## 2023-10-08 DIAGNOSIS — E559 Vitamin D deficiency, unspecified: Secondary | ICD-10-CM

## 2023-10-08 DIAGNOSIS — M19041 Primary osteoarthritis, right hand: Secondary | ICD-10-CM

## 2023-10-08 DIAGNOSIS — Z8679 Personal history of other diseases of the circulatory system: Secondary | ICD-10-CM

## 2023-10-08 DIAGNOSIS — M19071 Primary osteoarthritis, right ankle and foot: Secondary | ICD-10-CM

## 2023-10-08 DIAGNOSIS — I773 Arterial fibromuscular dysplasia: Secondary | ICD-10-CM

## 2023-10-08 DIAGNOSIS — Z79899 Other long term (current) drug therapy: Secondary | ICD-10-CM

## 2023-10-08 DIAGNOSIS — Z8669 Personal history of other diseases of the nervous system and sense organs: Secondary | ICD-10-CM

## 2023-10-08 DIAGNOSIS — I7300 Raynaud's syndrome without gangrene: Secondary | ICD-10-CM

## 2023-10-08 DIAGNOSIS — M3320 Polymyositis, organ involvement unspecified: Secondary | ICD-10-CM

## 2023-10-08 DIAGNOSIS — G5603 Carpal tunnel syndrome, bilateral upper limbs: Secondary | ICD-10-CM

## 2023-10-08 DIAGNOSIS — J479 Bronchiectasis, uncomplicated: Secondary | ICD-10-CM

## 2023-10-08 DIAGNOSIS — M62838 Other muscle spasm: Secondary | ICD-10-CM

## 2023-10-08 DIAGNOSIS — R5383 Other fatigue: Secondary | ICD-10-CM

## 2023-10-08 DIAGNOSIS — L52 Erythema nodosum: Secondary | ICD-10-CM

## 2023-10-08 DIAGNOSIS — R768 Other specified abnormal immunological findings in serum: Secondary | ICD-10-CM

## 2023-10-08 DIAGNOSIS — M0579 Rheumatoid arthritis with rheumatoid factor of multiple sites without organ or systems involvement: Secondary | ICD-10-CM

## 2023-10-08 DIAGNOSIS — M797 Fibromyalgia: Secondary | ICD-10-CM

## 2023-10-08 DIAGNOSIS — J849 Interstitial pulmonary disease, unspecified: Secondary | ICD-10-CM

## 2023-10-17 ENCOUNTER — Ambulatory Visit: Payer: Self-pay | Attending: Physician Assistant | Admitting: Physician Assistant

## 2023-10-17 ENCOUNTER — Encounter: Payer: Self-pay | Admitting: Physician Assistant

## 2023-10-17 VITALS — BP 124/80 | HR 93 | Resp 12 | Ht 68.0 in | Wt 312.8 lb

## 2023-10-17 DIAGNOSIS — R768 Other specified abnormal immunological findings in serum: Secondary | ICD-10-CM

## 2023-10-17 DIAGNOSIS — M3320 Polymyositis, organ involvement unspecified: Secondary | ICD-10-CM | POA: Diagnosis not present

## 2023-10-17 DIAGNOSIS — G5603 Carpal tunnel syndrome, bilateral upper limbs: Secondary | ICD-10-CM

## 2023-10-17 DIAGNOSIS — R5383 Other fatigue: Secondary | ICD-10-CM

## 2023-10-17 DIAGNOSIS — I773 Arterial fibromuscular dysplasia: Secondary | ICD-10-CM

## 2023-10-17 DIAGNOSIS — E559 Vitamin D deficiency, unspecified: Secondary | ICD-10-CM

## 2023-10-17 DIAGNOSIS — M0579 Rheumatoid arthritis with rheumatoid factor of multiple sites without organ or systems involvement: Secondary | ICD-10-CM | POA: Diagnosis not present

## 2023-10-17 DIAGNOSIS — M62838 Other muscle spasm: Secondary | ICD-10-CM

## 2023-10-17 DIAGNOSIS — Z8669 Personal history of other diseases of the nervous system and sense organs: Secondary | ICD-10-CM

## 2023-10-17 DIAGNOSIS — M797 Fibromyalgia: Secondary | ICD-10-CM

## 2023-10-17 DIAGNOSIS — Z8739 Personal history of other diseases of the musculoskeletal system and connective tissue: Secondary | ICD-10-CM

## 2023-10-17 DIAGNOSIS — I7300 Raynaud's syndrome without gangrene: Secondary | ICD-10-CM

## 2023-10-17 DIAGNOSIS — M19041 Primary osteoarthritis, right hand: Secondary | ICD-10-CM

## 2023-10-17 DIAGNOSIS — Z79899 Other long term (current) drug therapy: Secondary | ICD-10-CM | POA: Diagnosis not present

## 2023-10-17 DIAGNOSIS — J479 Bronchiectasis, uncomplicated: Secondary | ICD-10-CM

## 2023-10-17 DIAGNOSIS — J849 Interstitial pulmonary disease, unspecified: Secondary | ICD-10-CM

## 2023-10-17 DIAGNOSIS — M19042 Primary osteoarthritis, left hand: Secondary | ICD-10-CM

## 2023-10-17 DIAGNOSIS — L52 Erythema nodosum: Secondary | ICD-10-CM

## 2023-10-17 DIAGNOSIS — M19071 Primary osteoarthritis, right ankle and foot: Secondary | ICD-10-CM

## 2023-10-17 DIAGNOSIS — M19072 Primary osteoarthritis, left ankle and foot: Secondary | ICD-10-CM

## 2023-10-17 DIAGNOSIS — Z8679 Personal history of other diseases of the circulatory system: Secondary | ICD-10-CM

## 2023-10-17 NOTE — Patient Instructions (Signed)
Standing Labs We placed an order today for your standing lab work.   Please have your standing labs drawn in mid-June and every 3 months   Please have your labs drawn 2 weeks prior to your appointment so that the provider can discuss your lab results at your appointment, if possible.  Please note that you may see your imaging and lab results in MyChart before we have reviewed them. We will contact you once all results are reviewed. Please allow our office up to 72 hours to thoroughly review all of the results before contacting the office for clarification of your results.  WALK-IN LAB HOURS  Monday through Thursday from 8:00 am -12:30 pm and 1:00 pm-5:00 pm and Friday from 8:00 am-12:00 pm.  Patients with office visits requiring labs will be seen before walk-in labs.  You may encounter longer than normal wait times. Please allow additional time. Wait times may be shorter on  Monday and Thursday afternoons.  We do not book appointments for walk-in labs. We appreciate your patience and understanding with our staff.   Labs are drawn by [facility]. Please bring your co-pay at the time of your lab draw.  You may receive a bill from [facility] for your lab work.  Please note if you are on Hydroxychloroquine and and an order has been placed for a Hydroxychloroquine level,  you will need to have it drawn 4 hours or more after your last dose.  If you wish to have your labs drawn at another location, please call the office 24 hours in advance so we can fax the orders.  The office is located at [address], [place], [place] [address]   If you have any questions regarding directions or hours of operation,  please call [phone].   As a reminder, please drink plenty of water prior to coming for your lab work. Thanks!

## 2023-10-18 LAB — CBC WITH DIFFERENTIAL/PLATELET
Absolute Lymphocytes: 1642 {cells}/uL (ref 850–3900)
Absolute Monocytes: 922 {cells}/uL (ref 200–950)
Basophils Absolute: 38 {cells}/uL (ref 0–200)
Basophils Relative: 0.4 %
Eosinophils Absolute: 221 {cells}/uL (ref 15–500)
Eosinophils Relative: 2.3 %
HCT: 38.1 % (ref 35.0–45.0)
Hemoglobin: 12.6 g/dL (ref 11.7–15.5)
MCH: 27.8 pg (ref 27.0–33.0)
MCHC: 33.1 g/dL (ref 32.0–36.0)
MCV: 84.1 fL (ref 80.0–100.0)
MPV: 10.9 fL (ref 7.5–12.5)
Monocytes Relative: 9.6 %
Neutro Abs: 6778 {cells}/uL (ref 1500–7800)
Neutrophils Relative %: 70.6 %
Platelets: 290 10*3/uL (ref 140–400)
RBC: 4.53 10*6/uL (ref 3.80–5.10)
RDW: 12.3 % (ref 11.0–15.0)
Total Lymphocyte: 17.1 %
WBC: 9.6 10*3/uL (ref 3.8–10.8)

## 2023-10-18 LAB — COMPLETE METABOLIC PANEL WITH GFR
AG Ratio: 1.3 (calc) (ref 1.0–2.5)
ALT: 14 U/L (ref 6–29)
AST: 17 U/L (ref 10–35)
Albumin: 4.1 g/dL (ref 3.6–5.1)
Alkaline phosphatase (APISO): 54 U/L (ref 37–153)
BUN: 14 mg/dL (ref 7–25)
CO2: 27 mmol/L (ref 20–32)
Calcium: 9.6 mg/dL (ref 8.6–10.4)
Chloride: 106 mmol/L (ref 98–110)
Creat: 0.64 mg/dL (ref 0.50–1.03)
Globulin: 3.2 g/dL (ref 1.9–3.7)
Glucose, Bld: 111 mg/dL — ABNORMAL HIGH (ref 65–99)
Potassium: 4.1 mmol/L (ref 3.5–5.3)
Sodium: 140 mmol/L (ref 135–146)
Total Bilirubin: 0.4 mg/dL (ref 0.2–1.2)
Total Protein: 7.3 g/dL (ref 6.1–8.1)
eGFR: 103 mL/min/{1.73_m2} (ref >=60)

## 2023-10-18 LAB — CK: Total CK: 61 U/L (ref 21–240)

## 2023-10-18 NOTE — Progress Notes (Signed)
 Glucose is 111. Rest of CMP WNL.  CBC WNL  CK WNL

## 2023-10-21 ENCOUNTER — Encounter: Payer: Self-pay | Admitting: Internal Medicine

## 2023-10-22 DIAGNOSIS — F4310 Post-traumatic stress disorder, unspecified: Secondary | ICD-10-CM | POA: Diagnosis not present

## 2023-10-24 ENCOUNTER — Other Ambulatory Visit: Payer: Self-pay | Admitting: Physician Assistant

## 2023-10-24 ENCOUNTER — Other Ambulatory Visit: Payer: Self-pay | Admitting: Internal Medicine

## 2023-10-24 DIAGNOSIS — M0579 Rheumatoid arthritis with rheumatoid factor of multiple sites without organ or systems involvement: Secondary | ICD-10-CM

## 2023-10-24 NOTE — Telephone Encounter (Signed)
 Last Fill: 09/25/2023  Labs: 10/17/2023 Glucose is 111. Rest of CMP WNL. CBC WNL CK WNL  TB Gold: 02/26/2023 negative    Next Visit: 01/07/2024  Last Visit: 10/17/2023  [id number] arthritis involving multiple sites with positive rheumatoid factor   Current Dose per office note on 10/17/2023: Enbrel 50 mg sq injections once weekly   Okay to refill Enbrel?

## 2023-10-29 DIAGNOSIS — F4310 Post-traumatic stress disorder, unspecified: Secondary | ICD-10-CM | POA: Diagnosis not present

## 2023-10-31 ENCOUNTER — Telehealth: Payer: Self-pay | Admitting: Pharmacist

## 2023-10-31 NOTE — Telephone Encounter (Signed)
 Submitted a Prior Authorization RENEWAL request to [employer] for ENBREL via fax. Will update once we receive a response.  Fax: [phone] Phone: [phone] Case # [id number]

## 2023-11-01 NOTE — Telephone Encounter (Signed)
 Received notification from [facility] regarding a prior authorization for ENBREL. Authorization has been APPROVED from 10/01/2023 to 04/28/2025. Approval letter sent to scan center.  Chesley Mires, PharmD, MPH, BCPS, CPP Clinical Pharmacist (Rheumatology and Pulmonology)

## 2023-11-04 ENCOUNTER — Other Ambulatory Visit: Payer: Self-pay | Admitting: Internal Medicine

## 2023-11-05 DIAGNOSIS — F4310 Post-traumatic stress disorder, unspecified: Secondary | ICD-10-CM | POA: Diagnosis not present

## 2023-11-12 DIAGNOSIS — F4310 Post-traumatic stress disorder, unspecified: Secondary | ICD-10-CM | POA: Diagnosis not present

## 2023-11-13 ENCOUNTER — Other Ambulatory Visit: Payer: Self-pay | Admitting: Internal Medicine

## 2023-11-21 DIAGNOSIS — F4310 Post-traumatic stress disorder, unspecified: Secondary | ICD-10-CM | POA: Diagnosis not present

## 2023-11-25 NOTE — Patient Instructions (Incomplete)
   Medications changes include :   fluoxetine 20 mg daily,  xanax 0.5 mg twice daily as needed for panic attack.  Buspar 5 mg at night as needed for sleep.

## 2023-11-25 NOTE — Progress Notes (Signed)
 Subjective:    Patient ID: Jill Harper, female    DOB: February 14, 1966, 58 y.o.   MRN: [id number]     HPI Julieanne is here for follow up of her chronic medical problems.  No labs needed  Medications and allergies reviewed with patient and updated if appropriate.  Current Outpatient Medications on File Prior to Visit  Medication Sig Dispense Refill   albuterol  (PROVENTIL ) (2.5 MG/3ML) 0.083% nebulizer solution Take 3 mLs (2.5 mg total) by nebulization every 6 (six) hours as needed for wheezing or shortness of breath. 120 vial 5   albuterol  (VENTOLIN  HFA) 108 (90 Base) MCG/ACT inhaler INHALE 2 PUFFS BY MOUTH INTO THE LUNGS FOUR TIMES DAILY AS NEEDED 8.5 g 1   alendronate (FOSAMAX) 70 MG tablet Take 1 tablet by mouth once a week.     aspirin EC 81 MG tablet Take 81 mg by mouth daily.     atenolol  (TENORMIN ) 25 MG tablet TAKE 1 TABLET(25 MG) BY MOUTH DAILY 90 tablet 2   azaTHIOprine  (IMURAN ) 50 MG tablet TAKE 2 TABLETS BY MOUTH DAILY 180 tablet 0   Cholecalciferol (VITAMIN D ) 50 MCG (2000 UT) CAPS Take by mouth.     diclofenac  Sodium (VOLTAREN ) 1 % GEL APPLY 2 TO 4 GRAMS TOPICALLY TO AFFECTED JOINT FOUR TIMES DAILY AS NEEDED 400 g 2   DULoxetine  (CYMBALTA ) 30 MG capsule TAKE ONE CAPSULE BY MOUTH DAILY FOR TOAL OF 90 MG DAILY 30 capsule 3   DULoxetine  (CYMBALTA ) 60 MG capsule TAKE 1 CAPSULE(60 MG) BY MOUTH DAILY 90 capsule 1   ENBREL  MINI 50 MG/ML injection INSERT 1 MINI CARTRIDGE INTO AUTOINJECTOR AND INJECT 50 MG UNDER THE SKIN EVERY 7 DAYS 12 mL 0   gabapentin  (NEURONTIN ) 100 MG capsule Take 3 capsules (300 mg total) by mouth at bedtime. TAKE 3 CAPSULES(300 MG) BY MOUTH AT BEDTIME 270 capsule 3   Galcanezumab -gnlm (EMGALITY ) 120 MG/ML SOAJ ADMINISTER 1 ML UNDER THE SKIN EVERY 30 DAYS 3 mL 4   guaiFENesin  (MUCINEX ) 600 MG 12 hr tablet Take 2 tablets (1,200 mg total) by mouth 2 (two) times daily as needed for cough or to loosen phlegm. (Patient taking differently: Take 1,200 mg by mouth 2  (two) times daily.)     levofloxacin  (LEVAQUIN ) 500 MG tablet Take 1 tablet (500 mg total) by mouth daily. (Patient not taking: Reported on 10/17/2023) 10 tablet 0   meclizine  (ANTIVERT ) 25 MG tablet Take 1 tablet (25 mg total) by mouth 3 (three) times daily as needed for dizziness. 30 tablet 0   metFORMIN  (GLUCOPHAGE ) 500 MG tablet TAKE 1 TABLET(500 MG) BY MOUTH TWICE DAILY WITH A MEAL 180 tablet 1   methocarbamol  (ROBAXIN ) 500 MG tablet Take 1 tablet (500 mg total) by mouth every 8 (eight) hours as needed. 60 tablet 3   Multiple Vitamins-Minerals (MULTIVITAMIN GUMMIES ADULT PO) Take 2 each by mouth daily.     predniSONE  (DELTASONE ) 10 MG tablet 3 tabs by mouth per day for 3 days,2tabs per day for 3 days,1tab per day for 3 days (Patient not taking: Reported on 10/17/2023) 18 tablet 0   Rimegepant Sulfate (NURTEC) 75 MG TBDP DISSOLVE ONE TABLET BY MOUTH DAILY AS NEEDED FOR MIGRAINES. TAKE AS CLOSE TO ONSET OF MIGRAINE AS POSSIBLE. 1 TABLET DAILY MAXIMUM. 15 tablet 11   sodium chloride  HYPERTONIC 3 % nebulizer solution Take 3 mL Twice daily via nebulizer as needed for cough/congestion 180 mL 11   telmisartan  (MICARDIS ) 20 MG tablet  TAKE 1 TABLET(20 MG) BY MOUTH DAILY 90 tablet 1   tirzepatide (ZEPBOUND) 2.5 MG/0.5ML injection vial Inject 2.5 mg into the skin once a week. (Patient not taking: Reported on 10/17/2023) 2 mL 0   TRELEGY ELLIPTA  100-62.5-25 MCG/ACT AEPB INHALE 1 PUFF INTO THE LUNGS DAILY 60 each 5   No current facility-administered medications on file prior to visit.     Review of Systems     Objective:  There were no vitals filed for this visit. BP Readings from Last 3 Encounters:  10/17/23 124/80  10/04/23 126/74  08/28/23 112/60   Wt Readings from Last 3 Encounters:  10/17/23 (!) 312 lb 12.8 oz (141.9 kg)  10/04/23 (!) 309 lb (140.2 kg)  08/28/23 (!) 311 lb (141.1 kg)   There is no height or weight on file to calculate BMI.    Physical Exam     Lab Results  Component  Value Date   WBC 9.6 10/17/2023   HGB 12.6 10/17/2023   HCT 38.1 10/17/2023   PLT 290 10/17/2023   GLUCOSE 111 (H) 10/17/2023   CHOL 120 09/09/2023   TRIG 88.0 09/09/2023   HDL 39.20 09/09/2023   LDLCALC 63 09/09/2023   ALT 14 10/17/2023   AST 17 10/17/2023   NA 140 10/17/2023   K 4.1 10/17/2023   CL 106 10/17/2023   CREATININE 0.64 10/17/2023   BUN 14 10/17/2023   CO2 27 10/17/2023   TSH 3.83 09/09/2023   INR 1.03 09/18/2018   HGBA1C 6.2 09/09/2023     Assessment & Plan:    See Problem List for Assessment and Plan of chronic medical problems.

## 2023-11-26 ENCOUNTER — Ambulatory Visit: Payer: Self-pay | Admitting: Internal Medicine

## 2023-11-26 ENCOUNTER — Encounter: Payer: Self-pay | Admitting: Internal Medicine

## 2023-11-26 VITALS — BP 118/68 | HR 93 | Temp 98.3°F | Ht 68.0 in | Wt 312.0 lb

## 2023-11-26 DIAGNOSIS — I10 Essential (primary) hypertension: Secondary | ICD-10-CM | POA: Diagnosis not present

## 2023-11-26 DIAGNOSIS — M797 Fibromyalgia: Secondary | ICD-10-CM

## 2023-11-26 DIAGNOSIS — I773 Arterial fibromuscular dysplasia: Secondary | ICD-10-CM

## 2023-11-26 DIAGNOSIS — R7303 Prediabetes: Secondary | ICD-10-CM

## 2023-11-26 DIAGNOSIS — E038 Other specified hypothyroidism: Secondary | ICD-10-CM | POA: Diagnosis not present

## 2023-11-26 DIAGNOSIS — F419 Anxiety disorder, unspecified: Secondary | ICD-10-CM

## 2023-11-26 NOTE — Assessment & Plan Note (Addendum)
 Chronic Has chronic pain, associated brain fog-symptoms varied in intensity Had recent flare - still in flare but improving Continue Cymbalta  90 mg daily, gabapentin  300 mg at bedtime Stressed regular exercise

## 2023-11-26 NOTE — Assessment & Plan Note (Signed)
Chronic  Following with Dr Maudie Mercury in [place]

## 2023-11-26 NOTE — Assessment & Plan Note (Signed)
 Chronic TSH periodically elevated 2 and half months ago TSH, free T4, free T3 were normal and thyroid  antibodies normal in recent past Monitor only- No need for medication

## 2023-11-26 NOTE — Assessment & Plan Note (Addendum)
 Chronic Lab Results  Component Value Date   HGBA1C 6.2 09/09/2023   Low sugar / carb diet Stressed regular exercise Stop metformin  for now since it does not seem to be helping

## 2023-11-26 NOTE — Assessment & Plan Note (Addendum)
 Chronic Overall controlled Denies panic attacks Has more good days than bad days - can use her techniques to help with the anxiety Continue duloxetine  90 mg daily Speaking with a therapist once a week

## 2023-11-26 NOTE — Assessment & Plan Note (Signed)
Chronic Blood pressure well-controlled Continue current medications-atenolol 25 mg daily, telmisartan 20 mg daily 

## 2023-12-03 ENCOUNTER — Telehealth: Payer: Self-pay

## 2023-12-03 NOTE — Telephone Encounter (Signed)
 Pharmacy Patient Advocate Encounter   Received notification from CoverMyMeds that prior authorization for Emgality  120MG /ML auto-injectors (migraine) is required/requested.   Insurance verification completed.   The patient is insured through [facility] .   Per test claim: PA required; PA submitted to above mentioned insurance via CoverMyMeds Key/confirmation #/EOC [id number] Status is pending

## 2023-12-04 ENCOUNTER — Other Ambulatory Visit (HOSPITAL_COMMUNITY): Payer: Self-pay

## 2023-12-04 NOTE — Telephone Encounter (Signed)
 Pharmacy Patient Advocate Encounter  Received notification from [facility] that Prior Authorization for Emgality  120MG /ML auto-injectors (migraine) has been APPROVED from 12/03/2023 to 12/02/2024. Unable to obtain price due to refill too soon rejection, last fill date 11/13/2023 next available fill date6/15/2025   PA #/Case ID/Reference #: N/A

## 2023-12-10 DIAGNOSIS — F4310 Post-traumatic stress disorder, unspecified: Secondary | ICD-10-CM | POA: Diagnosis not present

## 2023-12-23 ENCOUNTER — Other Ambulatory Visit: Payer: Self-pay | Admitting: Physician Assistant

## 2023-12-23 NOTE — Telephone Encounter (Signed)
 Last Fill: 09/23/2023  Labs: 10/17/2023 Glucose is 111. Rest of CMP WNL. CBC WNL CK WNL  Next Visit: 01/07/2024  Last Visit: 10/17/2023  DX: Rheumatoid arthritis involving multiple sites with positive rheumatoid factor   Current Dose per office note 10/17/2023: Imuran  100 mg by mouth daily.   Okay to refill Imuran ?

## 2023-12-24 DIAGNOSIS — F4310 Post-traumatic stress disorder, unspecified: Secondary | ICD-10-CM | POA: Diagnosis not present

## 2023-12-24 NOTE — Progress Notes (Signed)
 Office Visit Note  Patient: Jill Harper             Date of Birth: 07/27/1966           MRN: [id number]             PCP: Colene Dauphin, MD Referring: Colene Dauphin, MD Visit Date: 01/07/2024 Occupation: @GUAROCC @  Subjective:  Medication management  History of Present Illness: Jill Harper is a 58 y.o. female with seropositive rheumatoid arthritis, polymyositis and ILD.  She returns today after her last visit in March 2025.  She continues to have some discomfort in her hands.  She has not noticed any joint swelling.  She denies any increased muscle weakness.  She continues to be on Enbrel  50 mg subcu weekly along with Imuran  100 mg p.o. daily which she has been tolerating well.  Patient was evaluated by Dr. Matilde Son on August 27, 2023.  According to his note ILD with UIP pattern and bronchiectasis.  He advised continuing Mucinex , albuterol .  He mentioned that her PFTs were clinically stable and improved.  He decided to hold off on the repeat CT scan of the chest.  He recommended continuing current treatment.  She remains on 2.5 L of oxygen  at night with CPAP.  During the day she is on 2 L of oxygen .  Activities of Daily Living:  Patient reports morning stiffness for less than 5 minutes.   Patient Reports nocturnal pain.  Difficulty dressing/grooming: Reports Difficulty climbing stairs: Reports Difficulty getting out of chair: Reports Difficulty using hands for taps, buttons, cutlery, and/or writing: Reports  Review of Systems  Constitutional:  Positive for fatigue.  HENT:  Negative for mouth sores and mouth dryness.   Eyes:  Positive for dryness.  Respiratory:  Positive for cough, shortness of breath and wheezing.   Cardiovascular:  Positive for palpitations. Negative for chest pain.  Gastrointestinal:  Negative for blood in stool, constipation and diarrhea.  Endocrine: Positive for increased urination.  Genitourinary:  Positive for involuntary urination.  Musculoskeletal:   Positive for joint pain, joint pain, myalgias, morning stiffness, muscle tenderness and myalgias. Negative for gait problem, joint swelling and muscle weakness.  Skin:  Positive for sensitivity to sunlight. Negative for color change, rash and hair loss.  Allergic/Immunologic: Positive for susceptible to infections.  Neurological:  Positive for dizziness and headaches.  Hematological:  Negative for swollen glands.  Psychiatric/Behavioral:  Positive for sleep disturbance. Negative for depressed mood. The patient is not nervous/anxious.     PMFS History:  Patient Active Problem List   Diagnosis Date Noted   Chronic hypoxemic respiratory failure (HCC) 10/06/2023   Wheezing 10/04/2023   Moderate persistent asthma without complication 08/27/2023   Subclinical hypothyroidism 05/31/2023   Osteoporosis 03/27/2023   Rib pain on left side 01/08/2023   Back pain 11/21/2022   Left arm pain 09/13/2022   Left arm swelling 09/13/2022   Right upper quadrant pain 08/15/2022   Palpitations-related to MVP 04/02/2022   Nocturnal hypoxia 01/16/2022   Fibromuscular dysplasia of wall of intracranial artery (HCC) 12/20/2021   Anxiety 09/06/2021   Tingling 06/28/2021   Occipital neuralgia of left side 01/06/2020   Metabolic syndrome X 07/28/2019   Entrapment of left ulnar nerve 07/27/2019   Right hand pain 07/27/2019   Bilateral carpal tunnel syndrome 07/21/2019   Costochondritis 03/16/2019   Rib pain on right side 03/16/2019   Prediabetes 11/27/2018   Fibromuscular dysplasia (HCC) 10/17/2018   Hypertension 10/17/2018   Bronchiectasis  with (acute) exacerbation (HCC) 10/08/2018   OSA (obstructive sleep apnea) 08/20/2018   Rheumatoid arthritis (HCC) - Dr Alvira Josephs 04/17/2018   ILD (interstitial lung disease) (HCC) 04/17/2018   Dyspnea 04/01/2018   Bronchiectasis (HCC) 09/02/2017   LVH (left ventricular hypertrophy), mild 06/02/2017   Mitral regurgitation 05/22/2017   Jo-1 antibody positive 01/29/2017    Primary osteoarthritis of both feet 01/29/2017   Primary osteoarthritis of both hands 01/29/2017   Left shoulder tendonitis 01/17/2017   Vitamin D  deficiency 04/26/2016   Morbid obesity with BMI of 45.0-49.9, adult (HCC) 03/26/2016   Cough 07/18/2015   MVP (mitral valve prolapse)    Erythema nodosum    INCONTINENCE, URGE 05/26/2010   Migraine headache 02/15/2010   Fibromyalgia 06/20/2009   Vertigo 03/05/2008   Polymyositis (HCC) 02/11/2008   BRONCHIECTASIS 02/10/2008    Past Medical History:  Diagnosis Date   Abnormal liver function test    Bronchiectasis        Dyspnea    Fibromuscular dysplasia (HCC)    Fibromyalgia    Heart murmur    ILD (interstitial lung disease) (HCC)    Joint pain    Menorrhagia    MIGRAINE HEADACHE    MVP (mitral valve prolapse)    Osteoarthritis    Polymyositis (HCC)    Dr Zandra Hew; chronic MTX   Pulmonary fibrosis (HCC)    Rheumatoid arthritis (HCC)    Sleep apnea    Vertigo    Vitamin B 12 deficiency    Vitamin D  deficiency     Family History  Problem Relation Age of Onset   Diabetes Mother    Fibromyalgia Mother    Ulcers Mother    Heart failure Mother    Thyroid  disease Mother    Obesity Mother    Multiple myeloma Father    Hypertension Father    Stroke Father    Lupus Sister    Other Sister        abdominal adhesions resulting in bowel obstruction   Migraines Sister    Hypertension Sister    Rheum arthritis Sister    Multiple myeloma Sister    Hypertension Sister    Osteoarthritis Sister    Heart attack Sister    Diabetes Sister    Hypertension Sister    Diabetes Sister    Kidney disease Sister    Heart disease Brother        bypass surgery   Diabetes Brother    Diabetes Brother    Heart disease Brother    Diabetes Brother    Stroke Brother    Healthy Daughter    Headache Other        siblings with headaches but not diagnosed as migraines   Past Surgical History:  Procedure Laterality Date   ablation  uterine  2010   CARDIAC CATHETERIZATION  2002   normal   DILATION AND CURETTAGE OF UTERUS  08-31-08   Dr Ashby Lawman   IR ANGIO INTRA EXTRACRAN SEL COM CAROTID INNOMINATE BILAT MOD SED  09/18/2018   IR ANGIO VERTEBRAL SEL VERTEBRAL BILAT MOD SED  09/18/2018   IR US  GUIDE VASC ACCESS RIGHT  09/18/2018   Social History   Social History Narrative   Exercise: trying to walk - limited by fatigue   Lives at home with husband    Right handed   Caffeine: none    Immunization History  Administered Date(s) Administered   Influenza Split 04/10/2011, 06/18/2012, 05/06/2013   Influenza Whole 05/19/2008, 05/15/2009, 06/01/2010  Influenza, Seasonal, Injecte, Preservative Fre 05/31/2023   Influenza,inj,Quad PF,6+ Mos 04/20/2014, 05/26/2015, 05/31/2016, 04/05/2017, 06/16/2018, 04/22/2019, 04/22/2020   Influenza-Unspecified 04/11/2021, 04/02/2022   PFIZER(Purple Top)SARS-COV-2 Vaccination 10/27/2019, 11/19/2019   Pneumococcal Conjugate-13 01/29/2017   Pneumococcal Polysaccharide-23 08/07/2007, 06/16/2018   Td 07/12/2009   Tdap 01/06/2020   Unspecified SARS-COV-2 Vaccination 10/27/2019, 11/19/2019, 04/04/2021   Zoster, Live 06/06/2022     Objective: Vital Signs: BP 109/74 (BP Location: Left Arm, Patient Position: Sitting, Cuff Size: Large)   Pulse 78   Resp (!) 78   Ht 5\' 8"  (1.727 m)   Wt (!) 307 lb 3.2 oz (139.3 kg)   BMI 46.71 kg/m    Physical Exam Vitals and nursing note reviewed.  Constitutional:      Appearance: She is well-developed.  HENT:     Head: Normocephalic and atraumatic.  Eyes:     Conjunctiva/sclera: Conjunctivae normal.  Cardiovascular:     Rate and Rhythm: Normal rate and regular rhythm.     Heart sounds: Normal heart sounds.  Pulmonary:     Effort: Pulmonary effort is normal.     Breath sounds: Normal breath sounds.  Abdominal:     General: Bowel sounds are normal.     Palpations: Abdomen is soft.  Musculoskeletal:     Cervical back: Normal range of  motion.  Lymphadenopathy:     Cervical: No cervical adenopathy.  Skin:    General: Skin is warm and dry.     Capillary Refill: Capillary refill takes less than 2 seconds.  Neurological:     Mental Status: She is alert and oriented to person, place, and time.  Psychiatric:        Behavior: Behavior normal.      Musculoskeletal Exam: Good range of motion of the cervical spine.  There was no tenderness over thoracic or lumbar spine.  Shoulders, elbows, wrist joints, MCPs PIPs and DIPs Juengel range of motion with no synovitis.  Hip joints and knee joints with good range of motion without any warmth swelling or effusion.  There was no tenderness over ankles or MTPs.  CDAI Exam: CDAI Score: -- Patient Global: --; Provider Global: -- Swollen: --; Tender: -- Joint Exam 01/07/2024   No joint exam has been documented for this visit   There is currently no information documented on the homunculus. Go to the Rheumatology activity and complete the homunculus joint exam.  Investigation: No additional findings.  Imaging: No results found.  Recent Labs: Lab Results  Component Value Date   WBC 9.0 12/31/2023   HGB 12.7 12/31/2023   PLT 266 12/31/2023   NA 139 12/31/2023   K 4.6 12/31/2023   CL 104 12/31/2023   CO2 30 12/31/2023   GLUCOSE 113 (H) 12/31/2023   BUN 9 12/31/2023   CREATININE 0.62 12/31/2023   BILITOT 0.4 12/31/2023   ALKPHOS 45 09/09/2023   AST 15 12/31/2023   ALT 13 12/31/2023   PROT 7.3 12/31/2023   ALBUMIN 3.9 09/09/2023   CALCIUM 9.2 12/31/2023   GFRAA 114 01/18/2021   QFTBGOLDPLUS NEGATIVE 02/26/2023    Speciality Comments: Prior therapy includes: MTX (d/c due to ILD progresssion)  Procedures:  No procedures performed Allergies: Sulfonamide derivatives   Assessment / Plan:     Visit Diagnoses: Rheumatoid arthritis involving multiple sites with positive rheumatoid factor (HCC) - +RF, +CCP: Patient denies having a flare of rheumatoid arthritis.  She  states she continues to have some stiffness in her hands especially over the PIP joints.  She  continues to be on Enbrel  and Humira in combination without any interruption.  She has not had any recent infections since the last visit.  High risk medication use - Enbrel  50 mg sq injections once weekly (02/18/20-1st injection) and Imuran  100 mg by mouth daily. d/c MTX due to progressioin of ILD. -04/01/2024 CBC and CMP were normal.  She was advised to get labs every 3 months.  TB Gold was negative on February 26, 2024.  Will check TB Gold with her next labs.  Information immunization was placed in the AVS.  She was advised to hold Enbrel  and Imuran  if she develops an infection resume after infection resolves.  Plan: QuantiFERON-TB Gold Plus  Polymyositis (HCC) - Dx by Dr. Bernadine Briar 2007, CK 801, Jo-1 positive: CK has been within normal limits.  She had no increased muscular weakness or tenderness.  Jo-1 antibody positive  ILD (interstitial lung disease) (HCC) -she is followed by Dr[facility]ilde [facility] at [facility].  Completed pulmonary rehab. Stable.  Last HRCT March 2023 without disease progression.  Dr. Christiane Cowing decided to hold off on the repeat high-resolution CT.  According to his note from January 2025 her PFTs were better.  She is on 2 L of oxygen  per nasal cannula.  At night she uses 2.5 L of oxygen  with CPAP.  Bronchiectasis without complication (HCC) - Followed by Dr. Matilde Son: Patient denies any recent infections.  Bilateral carpal tunnel syndrome-currently not symptomatic.  Raynaud's syndrome without gangrene-she denies any recent flares.  Primary osteoarthritis of both hands-she continues to have some stiffness in her bilateral PIP joints.  No synovitis was noted.  Joint protection muscle strengthening was discussed.  Primary osteoarthritis of both feet-proper fitting shoes were advised.  Fibromyalgia-she continues to have some generalized pain and discomfort from fibromyalgia.  Need for regular exercise and  stretching was discussed.  Trapezius muscle spasm-stretching exercises were discussed.  Other fatigue-related to ILD and myofascial pain.  History of osteopenia - Followed by gynecologist.  She is taking fosamax 70 mg 1 tablet by mouth once weekly.  Vitamin D  deficiency-she takes 5 Emili supplement.  Fibromuscular dysplasia (HCC) - multifocal fibromuscular dysplasia of the bilateral internal carotid arteries-confirmed by catheter-based angiography-Followed by Dr. Burdette Carolin.  Erythema nodosum-no recent episodes.  History of mitral valve prolapse  History of migraine  Orders: Orders Placed This Encounter  Procedures   QuantiFERON-TB Gold Plus   No orders of the defined types were placed in this encounter.   Follow-Up Instructions: Return in about 3 months (around 04/08/2024) for Rheumatoid arthritis.   Nicholas Bari, MD  Note - This record has been created using Animal nutritionist.  Chart creation errors have been sought, but may not always  have been located. Such creation errors do not reflect on  the standard of medical care.

## 2023-12-31 ENCOUNTER — Other Ambulatory Visit: Payer: Self-pay | Admitting: *Deleted

## 2023-12-31 DIAGNOSIS — M0579 Rheumatoid arthritis with rheumatoid factor of multiple sites without organ or systems involvement: Secondary | ICD-10-CM | POA: Diagnosis not present

## 2023-12-31 DIAGNOSIS — M3320 Polymyositis, organ involvement unspecified: Secondary | ICD-10-CM | POA: Diagnosis not present

## 2023-12-31 DIAGNOSIS — F4310 Post-traumatic stress disorder, unspecified: Secondary | ICD-10-CM | POA: Diagnosis not present

## 2023-12-31 DIAGNOSIS — Z79899 Other long term (current) drug therapy: Secondary | ICD-10-CM | POA: Diagnosis not present

## 2024-01-01 ENCOUNTER — Ambulatory Visit: Payer: Self-pay | Admitting: Rheumatology

## 2024-01-01 LAB — COMPREHENSIVE METABOLIC PANEL WITH GFR
AG Ratio: 1.3 (calc) (ref 1.0–2.5)
ALT: 13 U/L (ref 6–29)
AST: 15 U/L (ref 10–35)
Albumin: 4.1 g/dL (ref 3.6–5.1)
Alkaline phosphatase (APISO): 58 U/L (ref 37–153)
BUN: 9 mg/dL (ref 7–25)
CO2: 30 mmol/L (ref 20–32)
Calcium: 9.2 mg/dL (ref 8.6–10.4)
Chloride: 104 mmol/L (ref 98–110)
Creat: 0.62 mg/dL (ref 0.50–1.03)
Globulin: 3.2 g/dL (ref 1.9–3.7)
Glucose, Bld: 113 mg/dL — ABNORMAL HIGH (ref 65–99)
Potassium: 4.6 mmol/L (ref 3.5–5.3)
Sodium: 139 mmol/L (ref 135–146)
Total Bilirubin: 0.4 mg/dL (ref 0.2–1.2)
Total Protein: 7.3 g/dL (ref 6.1–8.1)
eGFR: 103 mL/min/{1.73_m2} (ref >=60)

## 2024-01-01 LAB — CBC WITH DIFFERENTIAL/PLATELET
Absolute Lymphocytes: 1557 {cells}/uL (ref 850–3900)
Absolute Monocytes: 738 {cells}/uL (ref 200–950)
Basophils Absolute: 27 {cells}/uL (ref 0–200)
Basophils Relative: 0.3 %
Eosinophils Absolute: 198 {cells}/uL (ref 15–500)
Eosinophils Relative: 2.2 %
HCT: 39.7 % (ref 35.0–45.0)
Hemoglobin: 12.7 g/dL (ref 11.7–15.5)
MCH: 28.2 pg (ref 27.0–33.0)
MCHC: 32 g/dL (ref 32.0–36.0)
MCV: 88.2 fL (ref 80.0–100.0)
MPV: 11 fL (ref 7.5–12.5)
Monocytes Relative: 8.2 %
Neutro Abs: 6480 {cells}/uL (ref 1500–7800)
Neutrophils Relative %: 72 %
Platelets: 266 10*3/uL (ref 140–400)
RBC: 4.5 10*6/uL (ref 3.80–5.10)
RDW: 12.7 % (ref 11.0–15.0)
Total Lymphocyte: 17.3 %
WBC: 9 10*3/uL (ref 3.8–10.8)

## 2024-01-01 LAB — CK: Total CK: 59 U/L (ref 21–240)

## 2024-01-01 NOTE — Progress Notes (Signed)
 CBC, CMP and CK are normal except glucose is mildly elevated, probably not a fasting sample.

## 2024-01-07 ENCOUNTER — Encounter: Payer: Self-pay | Admitting: Rheumatology

## 2024-01-07 ENCOUNTER — Ambulatory Visit: Payer: Federal, State, Local not specified - PPO | Attending: Rheumatology | Admitting: Rheumatology

## 2024-01-07 VITALS — BP 109/74 | HR 78 | Resp 78 | Ht 68.0 in | Wt 307.2 lb

## 2024-01-07 DIAGNOSIS — Z8669 Personal history of other diseases of the nervous system and sense organs: Secondary | ICD-10-CM

## 2024-01-07 DIAGNOSIS — J849 Interstitial pulmonary disease, unspecified: Secondary | ICD-10-CM

## 2024-01-07 DIAGNOSIS — M19041 Primary osteoarthritis, right hand: Secondary | ICD-10-CM

## 2024-01-07 DIAGNOSIS — Z8679 Personal history of other diseases of the circulatory system: Secondary | ICD-10-CM

## 2024-01-07 DIAGNOSIS — M19071 Primary osteoarthritis, right ankle and foot: Secondary | ICD-10-CM

## 2024-01-07 DIAGNOSIS — I773 Arterial fibromuscular dysplasia: Secondary | ICD-10-CM

## 2024-01-07 DIAGNOSIS — R768 Other specified abnormal immunological findings in serum: Secondary | ICD-10-CM | POA: Diagnosis not present

## 2024-01-07 DIAGNOSIS — M3320 Polymyositis, organ involvement unspecified: Secondary | ICD-10-CM | POA: Diagnosis not present

## 2024-01-07 DIAGNOSIS — R5383 Other fatigue: Secondary | ICD-10-CM

## 2024-01-07 DIAGNOSIS — M62838 Other muscle spasm: Secondary | ICD-10-CM

## 2024-01-07 DIAGNOSIS — F4310 Post-traumatic stress disorder, unspecified: Secondary | ICD-10-CM | POA: Diagnosis not present

## 2024-01-07 DIAGNOSIS — M0579 Rheumatoid arthritis with rheumatoid factor of multiple sites without organ or systems involvement: Secondary | ICD-10-CM | POA: Diagnosis not present

## 2024-01-07 DIAGNOSIS — M19072 Primary osteoarthritis, left ankle and foot: Secondary | ICD-10-CM

## 2024-01-07 DIAGNOSIS — Z79899 Other long term (current) drug therapy: Secondary | ICD-10-CM

## 2024-01-07 DIAGNOSIS — M19042 Primary osteoarthritis, left hand: Secondary | ICD-10-CM

## 2024-01-07 DIAGNOSIS — L52 Erythema nodosum: Secondary | ICD-10-CM

## 2024-01-07 DIAGNOSIS — G5603 Carpal tunnel syndrome, bilateral upper limbs: Secondary | ICD-10-CM

## 2024-01-07 DIAGNOSIS — Z8739 Personal history of other diseases of the musculoskeletal system and connective tissue: Secondary | ICD-10-CM

## 2024-01-07 DIAGNOSIS — M797 Fibromyalgia: Secondary | ICD-10-CM

## 2024-01-07 DIAGNOSIS — I7300 Raynaud's syndrome without gangrene: Secondary | ICD-10-CM

## 2024-01-07 DIAGNOSIS — J479 Bronchiectasis, uncomplicated: Secondary | ICD-10-CM

## 2024-01-07 DIAGNOSIS — E559 Vitamin D deficiency, unspecified: Secondary | ICD-10-CM

## 2024-01-07 NOTE — Patient Instructions (Addendum)
 Standing Labs We placed an order today for your standing lab work.   Please have your standing labs drawn in August and every 3 months  Please have your labs drawn 2 weeks prior to your appointment so that the provider can discuss your lab results at your appointment, if possible.  Please note that you may see your imaging and lab results in MyChart before we have reviewed them. We will contact you once all results are reviewed. Please allow our office up to 72 hours to thoroughly review all of the results before contacting the office for clarification of your results.  WALK-IN LAB HOURS  Monday through Thursday from 8:00 am -12:30 pm and 1:00 pm-4:00 pm and Friday from 8:00 am-12:00 pm.  Patients with office visits requiring labs will be seen before walk-in labs.  You may encounter longer than normal wait times. Please allow additional time. Wait times may be shorter on  Monday and Thursday afternoons.  We do not book appointments for walk-in labs. We appreciate your patience and understanding with our staff.   Labs are drawn by [facility]. Please bring your co-pay at the time of your lab draw.  You may receive a bill from [facility] for your lab work.  Please note if you are on Hydroxychloroquine and and an order has been placed for a Hydroxychloroquine level,  you will need to have it drawn 4 hours or more after your last dose.  If you wish to have your labs drawn at another location, please call the office 24 hours in advance so we can fax the orders.  The office is located at [address], [address], [place], [place] [address]   If you have any questions regarding directions or hours of operation,  please call [phone].   As a reminder, please drink plenty of water prior to coming for your lab work. Thanks!   Vaccines You are taking a medication(s) that can suppress your immune system.  The following immunizations are recommended: Flu annually Covid-19  Td/Tdap (tetanus,  diphtheria, pertussis) every 10 years Pneumonia (Prevnar 15 then Pneumovax 23 at least 1 year apart.  Alternatively, can take Prevnar 20 without needing additional dose) Shingrix: 2 doses from 4 weeks to 6 months apart  Please check with your PCP to make sure you are up to date.   If you have signs or symptoms of an infection or start antibiotics: First, call your PCP for workup of your infection. Hold your medication through the infection, until you complete your antibiotics, and until symptoms resolve if you take the following: Injectable medication (Actemra, Benlysta, Cimzia, Cosentyx, Enbrel, Humira, Kevzara, Orencia, Remicade, Simponi, Stelara, Taltz, Tremfya) Methotrexate  Leflunomide  (Arava ) Mycophenolate (Cellcept) Xeljanz, Olumiant, or Rinvoq

## 2024-01-15 ENCOUNTER — Ambulatory Visit: Payer: Federal, State, Local not specified - PPO | Admitting: Adult Health

## 2024-01-16 ENCOUNTER — Other Ambulatory Visit: Payer: Self-pay | Admitting: Internal Medicine

## 2024-01-16 DIAGNOSIS — F4310 Post-traumatic stress disorder, unspecified: Secondary | ICD-10-CM | POA: Diagnosis not present

## 2024-01-22 ENCOUNTER — Other Ambulatory Visit: Payer: Self-pay | Admitting: Physician Assistant

## 2024-01-22 DIAGNOSIS — M0579 Rheumatoid arthritis with rheumatoid factor of multiple sites without organ or systems involvement: Secondary | ICD-10-CM

## 2024-01-22 NOTE — Telephone Encounter (Signed)
 Last Fill: 10/24/2023  Labs: 12/31/2023 CBC, CMP and CK are normal except glucose is mildly elevated, probably not a fasting sample.   TB Gold: 02/26/2023 Neg    Next Visit: 04/08/2024  Last Visit: 01/07/2024  [id number] arthritis involving multiple sites with positive rheumatoid factor   Current Dose per office note 01/07/2024: Enbrel  50 mg sq injections once weekly   Okay to refill Enbrel ?

## 2024-01-27 ENCOUNTER — Encounter: Payer: Self-pay | Admitting: Adult Health

## 2024-01-27 ENCOUNTER — Ambulatory Visit: Admitting: Adult Health

## 2024-01-27 VITALS — BP 107/64 | HR 69 | Ht 68.0 in | Wt 310.0 lb

## 2024-01-27 DIAGNOSIS — G43E09 Chronic migraine with aura, not intractable, without status migrainosus: Secondary | ICD-10-CM | POA: Diagnosis not present

## 2024-01-27 MED ORDER — EMGALITY 120 MG/ML ~~LOC~~ SOAJ: SUBCUTANEOUS | 4 refills | Status: DC

## 2024-01-27 NOTE — Progress Notes (Signed)
 PATIENT: Jill Harper DOB: 08-01-1966  REASON FOR VISIT: follow up HISTORY FROM: patient PRIMARY NEUROLOGIST: Dr. Ines   Chief Complaint  Patient presents with   Follow-up    Pt in 19 alone Pt here for Migraine f/u Pt having issues filling Nurtec Pt states Migraines are being Managed better Pt states had aura and than had a nosebleed for 3 days and finally had headache on right side of head Pt on continuous 02 2L      HISTORY OF PRESENT ILLNESS: Today 01/27/24   Jill Harper is a 58 y.o. female who has been followed in this office for migraine headache. Returns today for follow-up.  Patient reports overall she has been doing well.  She has approximately 1-2 mild headaches a week.  She only has 2 migraines a month.  Typically when she takes Nurtec her headache will resolve within an hour.  She does get an aura with her migraines.  Reports that the right or left side of her face will start to tingle.  This is usually followed by dizziness and then the headache.  She denies photophobia, phonophobia, nausea, vomiting.  She states after migraine she usually feels washed out.  She does see Dr. Shellia at [facility] he currently has her on 2 L of oxygen .  She states that during the summer months she will wear the oxygen  during the day if she is out.  She is also seeing a [facility] for FMD.  She returns today for an evaluation.    HISTORY 05/16/23:  Jill Harper is a 58 y.o. female with a history of Migraine headaches and FMD. Returns today for follow-up.  She reports overall her migraines have increased in the last month.  She feels that this may be due to cooler weather.  She is still happy with her current medication regimen.  She continues on Emgality  monthly, gabapentin  300 mg at bedtime and Nurtec for abortive therapy.  She states that Nurtec works well she just has to get better about taking it as soon as she gets a headache.   11/01/22: Jill Harper is a 58 y.o.  female with a history of migraine headaches and fibromuscular dysplasia. Returns today for follow-up.  Feels that Emgality  is working well. Having issues getting nurtec- pharmacy is telling her only 10 tablets allowed in a year. On average about 3 migraines a month. Weather triggered. Get aura that consistent of tingling on the left side of face, difficulty process words. Has this happen about 4 times a month but no headache pain comes. Each one of her migraines (with pain) starts with this aura. Also having vertigo- went to vestibular rehab- only had 1 episode since then.   Gabapentin  has been helpful for the sudden onset of stabbing headache. Has not had an episode where is awakes from sleep in pain.    05/01/22: Jill Harper is a 58 year old female with a history of migraine headaches and fibromuscular dysplasia.  She returns today for follow-up.  She feels that Emgality  is working well although she states in the last few months she is having 6-8 headaches a month. The headache may last 2-3 days. Reports approximately 12-15 headache days a month. Uses nurtec for a migraine. She has an actual migraine about 5 times a month.  She states that she does not always take Nurtec at the onset of a migraine.  Sometimes she waits to see if the headache will get  more severe.  She was also concerned about overusing the medication.  She returns today for an evaluation  08/14/21: Jill Harper is a 58 year old female with a history of migraine headaches.  She returns today for follow-up.  She states that her migraines have been under relatively good control.  She states is very rare for her to have 1-2 headaches a month.  Emgality  has been lasting at least 4 weeks.  Relpax  and Nurtec to offer her benefit if she has to take them.  Patient was getting stabbing headaches during the night.  She states that these episodes have also reduced.  She states on occasion she will have a mild headache later in the evening.  This past Friday  and Saturday she did wake up early morning with a stabbing headache.  She did use Relpax  with good benefit.  She is inquiring if gabapentin  can be increased.  Patient has FMD.  She states that in a support group she is part of imaging a full body scan to identify any other areas that has been affected by FMD.  She is curious if this needs to be completed.  She has an appointment next month with Dr. Ahe\rn.  02/07/21: Jill Harper is a 58 year old female with a history of migraine headaches.  She returns today for follow-up.  She reports that she has approximately 2 migraines a week.  She typically can take Nurtec or Relpax  and the headache resolves fairly quickly.  She uses Ajovy  for preventative therapy however this is not covered through her insurance she has been using a co-pay savings card.  She would like to switch to Emgality  which is covered through her insurance.  She states that taking gabapentin  at bedtime has reduced the severity and frequency of the stabbing headaches that she was receiving.  She does report that she often gets them during the day as well.  At least 2 times a week.  She states that it only lasts for several minutes and afterwards she may feel a little foggy in her head will be tender but that resolves and she is able to carry on with her day.  She does use oxygen  at night.  She returns today for an evaluation.  11/03/20: Jill Harper is a 58 year old female with a history of migraine headaches.  She is currently taking Ajovy  every 3 weeks.  She has Relpax  and Nurtec that she rotates nightly.  Reports that she has sharp shooting pains in the left parietal region that radiates to the temporal region.  This only occurs at night usually wakes her up from sleep.  She has had this type of discomfort before however it has become more frequent recently.  She states that it only lasts for several seconds to minutes before it resolves.  Patient states that she began taking Nurtec every other night  and reports that when she took Nurtec she did not wake up with this discomfort.  She states that she did not added Relpax  and will take it on the nights that she did not take Nurtec and this also eliminated the discomfort at night. the patient wears a CPAP machine with supplemental oxygen  at night managed by Dr. Shellia.  She states during the day she may have 3 to 4 days a week that she has a mild headache.  She states that the severity has definitely improved.  She typically does not take anything for these headaches.  She reports that sometimes the headaches can last all day  long.  She does feel that Ajovy  has improved the severity of her headaches.    HISTORY ,(copied from Dr. Sharion note)  When she has a headache/migraine the relpax  works. She may have to take 2 doses. Sometimes tylenol  will work alone. She has weired feeling and stabbing pain sometimes and lasts a few minutes in the left parietal area. She feels the Ajovy  is not as effective, wears out, we discussed changing to Emgality  or possibly taking Ajovy  q3weeks, gave her sampes, she will try Ajovt q3weeks. Try Nurtec.      Interval history September 23, 2019: Patient here for follow-up, she was last seen in December for hand pain and EMG nerve conduction study did show carpal tunnel syndrome.  PMHx vitamin B12 deficiency, sleep apnea, rheumatoid arthritis, pulmonary fibrosis, polymyositis, osteoarthritis, migraines, joint pain, interstitial lung disease, fibromyalgia, fibromuscular dysplasia, bronchiectasis and abnormal liver function test. She saw Dr. Sheree and vein in vascular for discoloration of her skin, she gets blue tips of fingers and nails, diagnosed with raynaud's phenomenon related to her RA, no vascular intervention, reviewed his notes and discussed with patient as above. She saw Dr. Marchelle for the CTS and they are going to monitor, the symptoms have subsided and her symptoms were slight atypical for CTS on the right, she had ulnar  neuropathy on the left and she noticed she did have symptoms and she was given prednisone  which helped but it still some and goes, Ajovy  working for migraines not daily headaches, she can have an occassional headache and it generally subsides on its own occipital on the left feels hot.    Interval history 07/20/2019: Patient here for a new chief complaint as requested by Dr. Dolphus for hand pain. PMHx vitamin B12 deficiency, sleep apnea, rheumatoid arthritis, pulmonary fibrosis, polymyositis, osteoarthritis, migraines, joint pain, interstitial lung disease, fibromyalgia, fibromuscular dysplasia, bronchiectasis and abnormal liver function test.  In August or September, she had hand pain. Her hands felt cool, right > left, she started getting cramping in the right hand, not significantly painful just cramped, difficult time straightening it, massage helped. A month later it happened again, more painful, and she could see it drawing, started getting colder to the touch, she woke up with excruciating pain in the hand and forearm ventrally and she was cramped again. She took 2 alleve. Severe, some numbness, pain felt like tightness, severe and visibly drawing up. Massaging helped finger by finger. MRI cervical spine was normal.  She has weakness in her hands. Progressively worsening. Pain in both shoulders. Some numbness in the first 4 digits. No prior diagnosis of CTS. No injury. She also felt a big knot on the inside of her wrist. Like a muscle spasm. No other focal neurologic deficits, associated symptoms, inciting events or modifiable factors.     Personally reviewed MRI cervical spine 07/08/2019: Normal   Cbc/cmp normal 03/2019   Interval history 06/08/2019: Carotid arteries on CTA c/w FMD. Discussed Fibromuscular Dysplasia, sent to Dr. Dolphus and being followed there. She has sharp pain in the back of the head, stops her because it is so severe, so painful it stops her in her tracks. Leaves her weak  afterwards. Can go weeks without it. It is random, unknown what triggers, brief, severe. Also continues to have migraines, left sided, pulsating, pounding, contsant pain, behind the eye, feels she may get ptosis, no lacrimation or rhinorrhea. She has light and sound sensitivity. She has daily headaches, no medication overuse, no aura. She has 8 migraine  days a month with moderate or severe quality and can last 24-72 hours ongoing for > year at this frequency and severity and quality. For FMD and BO meds and a daily asa. Tried atenolol , relpax , telmisartan , verapamil , nortriptyline, topamax. She has some brain fog, discussed normal aging, chronic pain and lifestyle. She has had neck pain, she has numbness and tingling in the left hand and arm, ongoing for >  Months, failed conservative measures, under the care of a physician.     HPI:  Jill Harper is a 58 y.o. female here as requested by Dr. Geofm for headaches. PMHx RA, bronchiectasis, polymyositis on imuran  follows with Rheumatology, never smoked, interstitial lung disease, fibromyalgia, migraines, mitral valve prolapse, abnormal liver function test. She was supposed to have a sleep study but did not complete that (per notes Redell Prescott).  She reports she has headaches that come on during exertion and dissipate without medication when she slows down or alters activities. These are different from her usual migraines. She just had sleep test a few weeks ago, snoring, stops breathing. She does not have the results. She wakes up at night feeling like she can't breathe. She wakes with with new headache in the setting of sleep problems for at least 5-6 months. She may have the same headache later in the day. The headache is a fogginedd across  the forehead and more on the left.She wakes with this headache in the setting if snoring and lung problems. The headache is worse with exertion, walking and talking, worse with valsalva and also with sex/orgasm. Rest helps and  sitting still and it dissipates slowly. Can be moderately severe. Here with husband who provides much information.   Headache medications used: Atenolol , Flexeril, Relpax ,   Reviewed notes, labs and imaging from outside physicians, which showed:   Personally reviewed images from 2013 and agree with the following    Reviewed Redell Spearing notes.  Last time she was seen was 07/28/2018.  Patient has a history of rheumatoid arthritis, polymyositis followed in rheumatology.  Also bronchiectasis.  She was supposed to have a sleep study that was previously ordered but she did not.  Epworth Sleepiness Scale 7 06/16/2018.   ANA negative, rheumatoid factor XX 9, anti-CCP 213, SCL 70, SSA SSB negative, Jo 1+.  Patient's last echocardiogram 05/29/2017 with ejection fraction 60 to 65%, moderate left ventricular hypertrophy, grade 1 DD.  CT of the sinuses were negative for acute disease.    Called Dr. Magdaleno office and requested results of sleep testing  REVIEW OF SYSTEMS: Out of a complete 14 system review of symptoms, the patient complains only of the following symptoms, and all other reviewed systems are negative.   Listed in HPI  ALLERGIES: Allergies  Allergen Reactions   Sulfonamide Derivatives     REACTION: hives    HOME MEDICATIONS: Outpatient Medications Prior to Visit  Medication Sig Dispense Refill   albuterol  (PROVENTIL ) (2.5 MG/3ML) 0.083% nebulizer solution Take 3 mLs (2.5 mg total) by nebulization every 6 (six) hours as needed for wheezing or shortness of breath. 120 vial 5   albuterol  (VENTOLIN  HFA) 108 (90 Base) MCG/ACT inhaler INHALE 2 PUFFS BY MOUTH INTO THE LUNGS FOUR TIMES DAILY AS NEEDED 8.5 g 1   alendronate (FOSAMAX) 70 MG tablet Take 1 tablet by mouth once a week.     aspirin EC 81 MG tablet Take 81 mg by mouth daily.     atenolol  (TENORMIN ) 25 MG tablet TAKE 1 TABLET(25 MG) BY  MOUTH DAILY 90 tablet 2   azaTHIOprine  (IMURAN ) 50 MG tablet TAKE 2 TABLETS BY MOUTH DAILY 180  tablet 0   Cholecalciferol (VITAMIN D ) 50 MCG (2000 UT) CAPS Take by mouth.     diclofenac  Sodium (VOLTAREN ) 1 % GEL APPLY 2 TO 4 GRAMS TOPICALLY TO AFFECTED JOINT FOUR TIMES DAILY AS NEEDED 400 g 2   DULoxetine  (CYMBALTA ) 30 MG capsule TAKE ONE CAPSULE BY MOUTH DAILY FOR TOAL OF 90 MG DAILY 30 capsule 3   DULoxetine  (CYMBALTA ) 60 MG capsule TAKE 1 CAPSULE(60 MG) BY MOUTH DAILY 90 capsule 1   ENBREL  MINI 50 MG/ML injection INSERT 1 MINI CARTRIDGE INTO AUTOINJECTOR AND INJECT 50 MG UNDER THE SKIN EVERY 7 DAYS 12 mL 0   gabapentin  (NEURONTIN ) 100 MG capsule Take 3 capsules (300 mg total) by mouth at bedtime. TAKE 3 CAPSULES(300 MG) BY MOUTH AT BEDTIME 270 capsule 3   guaiFENesin  (MUCINEX ) 600 MG 12 hr tablet Take 2 tablets (1,200 mg total) by mouth 2 (two) times daily as needed for cough or to loosen phlegm. (Patient taking differently: Take 1,200 mg by mouth 2 (two) times daily.)     meclizine  (ANTIVERT ) 25 MG tablet Take 1 tablet (25 mg total) by mouth 3 (three) times daily as needed for dizziness. 30 tablet 0   methocarbamol  (ROBAXIN ) 500 MG tablet Take 1 tablet (500 mg total) by mouth every 8 (eight) hours as needed. 60 tablet 3   Multiple Vitamins-Minerals (MULTIVITAMIN GUMMIES ADULT PO) Take 2 each by mouth daily.     Rimegepant Sulfate (NURTEC) 75 MG TBDP DISSOLVE ONE TABLET BY MOUTH DAILY AS NEEDED FOR MIGRAINES. TAKE AS CLOSE TO ONSET OF MIGRAINE AS POSSIBLE. 1 TABLET DAILY MAXIMUM. 15 tablet 11   sodium chloride  HYPERTONIC 3 % nebulizer solution Take 3 mL Twice daily via nebulizer as needed for cough/congestion 180 mL 11   telmisartan  (MICARDIS ) 20 MG tablet TAKE 1 TABLET(20 MG) BY MOUTH DAILY 90 tablet 1   TRELEGY ELLIPTA  100-62.5-25 MCG/ACT AEPB INHALE 1 PUFF INTO THE LUNGS DAILY 60 each 5   Galcanezumab -gnlm (EMGALITY ) 120 MG/ML SOAJ ADMINISTER 1 ML UNDER THE SKIN EVERY 30 DAYS 3 mL 4   No facility-administered medications prior to visit.    PAST MEDICAL HISTORY: Past Medical History:   Diagnosis Date   Abnormal liver function test    Bronchiectasis        Dyspnea    Fibromuscular dysplasia (HCC)    Fibromyalgia    Heart murmur    ILD (interstitial lung disease) (HCC)    Joint pain    Menorrhagia    MIGRAINE HEADACHE    MVP (mitral valve prolapse)    Osteoarthritis    Polymyositis (HCC)    Dr Elsie Chol; chronic MTX   Pulmonary fibrosis (HCC)    Rheumatoid arthritis (HCC)    Sleep apnea    Vertigo    Vitamin B 12 deficiency    Vitamin D  deficiency     PAST SURGICAL HISTORY: Past Surgical History:  Procedure Laterality Date   ablation uterine  2010   CARDIAC CATHETERIZATION  2002   normal   DILATION AND CURETTAGE OF UTERUS  08-31-08   Dr Truman Corona   IR ANGIO INTRA EXTRACRAN SEL COM CAROTID INNOMINATE BILAT MOD SED  09/18/2018   IR ANGIO VERTEBRAL SEL VERTEBRAL BILAT MOD SED  09/18/2018   IR US  GUIDE VASC ACCESS RIGHT  09/18/2018    FAMILY HISTORY: Family History  Problem Relation Age of Onset  Diabetes Mother    Fibromyalgia Mother    Ulcers Mother    Heart failure Mother    Thyroid  disease Mother    Obesity Mother    Multiple myeloma Father    Hypertension Father    Stroke Father    Lupus Sister    Other Sister        abdominal adhesions resulting in bowel obstruction   Migraines Sister    Hypertension Sister    Rheum arthritis Sister    Multiple myeloma Sister    Hypertension Sister    Osteoarthritis Sister    Heart attack Sister    Diabetes Sister    Hypertension Sister    Diabetes Sister    Kidney disease Sister    Heart disease Brother        bypass surgery   Diabetes Brother    Diabetes Brother    Heart disease Brother    Diabetes Brother    Stroke Brother    Healthy Daughter    Headache Other        siblings with headaches but not diagnosed as migraines    SOCIAL HISTORY: Social History   Socioeconomic History   Marital status: Married    Spouse name: Fiza Nation   Number of children: 1   Years of  education: Not on file   Highest education level: 12th grade  Occupational History   Occupation: Compliant and Injury Clerk  Tobacco Use   Smoking status: Never    Passive exposure: Never   Smokeless tobacco: Never   Tobacco comments:    Married, lives with spouse. 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  Vaping Use   Vaping status: Never Used  Substance and Sexual Activity   Alcohol use: Not Currently    Comment: maybe a wine cooler once every other year   Drug use: Never   Sexual activity: Yes    Birth control/protection: Surgical  Other Topics Concern   Not on file  Social History Narrative   Exercise: trying to walk - limited by fatigue   Lives at home with husband    Right handed   Caffeine: none    Retired    Teacher, early years/pre Strain: Low Risk  (10/03/2023)   Overall Financial Resource Strain (CARDIA)    Difficulty of Paying Living Expenses: Not hard at all  Food Insecurity: No Food Insecurity (10/03/2023)   Hunger Vital Sign    Worried About Running Out of Food in the Last Year: Never true    Ran Out of Food in the Last Year: Never true  Transportation Needs: No Transportation Needs (10/03/2023)   PRAPARE - Administrator, Civil Service (Medical): No    Lack of Transportation (Non-Medical): No  Physical Activity: Insufficiently Active (10/03/2023)   Exercise Vital Sign    Days of Exercise per Week: 3 days    Minutes of Exercise per Session: 10 min  Stress: No Stress Concern Present (10/03/2023)   [employer] of Occupational Health - Occupational Stress Questionnaire    Feeling of Stress : Only a little  Social Connections: Socially Integrated (10/03/2023)   Social Connection and Isolation Panel    Frequency of Communication with Friends and Family: More than three times a week    Frequency of Social Gatherings with Friends and Family: Patient declined    Attends Religious Services: More than  4 times per year    Active  Member of Clubs or Organizations: Patient declined    Attends Engineer, structural: More than 4 times per year    Marital Status: Married  Catering manager Violence: Not on file      PHYSICAL EXAM  Vitals:   01/27/24 0907  BP: 107/64  Pulse: 69  Weight: (!) 310 lb (140.6 kg)  Height: 5' 8 (1.727 m)   Body mass index is 47.14 kg/m.  Generalized: Well developed, in no acute distress   Neurological examination  Mentation: Alert oriented to time, place, history taking. Follows all commands speech and language fluent Cranial nerve II-XII: Pupils were equal round reactive to light. Extraocular movements were full, visual field were full on confrontational test. Facial sensation and strength were normal. Uvula tongue midline. Head turning and shoulder shrug  were normal and symmetric. Motor: The motor testing reveals 5 over 5 strength of all 4 extremities. Good symmetric motor tone is noted throughout.  Sensory: Sensory testing is intact to soft touch on all 4 extremities. No evidence of extinction is noted.  Coordination: Cerebellar testing reveals good finger-nose-finger and heel-to-shin bilaterally.  Gait and station: Gait is normal.    DIAGNOSTIC DATA (LABS, IMAGING, TESTING) - I reviewed patient records, labs, notes, testing and imaging myself where available.  Lab Results  Component Value Date   WBC 9.0 12/31/2023   HGB 12.7 12/31/2023   HCT 39.7 12/31/2023   MCV 88.2 12/31/2023   PLT 266 12/31/2023      Component Value Date/Time   NA 139 12/31/2023 1125   NA 141 10/12/2019 1432   K 4.6 12/31/2023 1125   CL 104 12/31/2023 1125   CO2 30 12/31/2023 1125   GLUCOSE 113 (H) 12/31/2023 1125   BUN 9 12/31/2023 1125   BUN 11 10/12/2019 1432   CREATININE 0.62 12/31/2023 1125   CALCIUM 9.2 12/31/2023 1125   PROT 7.3 12/31/2023 1125   PROT 7.7 10/12/2019 1432   ALBUMIN 3.9 09/09/2023 0950   ALBUMIN 4.6 10/12/2019 1432   AST 15  12/31/2023 1125   ALT 13 12/31/2023 1125   ALKPHOS 45 09/09/2023 0950   BILITOT 0.4 12/31/2023 1125   BILITOT 0.3 10/12/2019 1432   GFRNONAA 98 01/18/2021 1626   GFRAA 114 01/18/2021 1626   Lab Results  Component Value Date   CHOL 120 09/09/2023   HDL 39.20 09/09/2023   LDLCALC 63 09/09/2023   TRIG 88.0 09/09/2023   CHOLHDL 3 09/09/2023   Lab Results  Component Value Date   HGBA1C 6.2 09/09/2023   Lab Results  Component Value Date   VITAMINB12 921 (H) 07/11/2021   Lab Results  Component Value Date   TSH 3.83 09/09/2023      ASSESSMENT AND PLAN 58 y.o. year old female  has a past medical history of Abnormal liver function test, Bronchiectasis, Dyspnea, Fibromuscular dysplasia (HCC), Fibromyalgia, Heart murmur, ILD (interstitial lung disease) (HCC), Joint pain, Menorrhagia, MIGRAINE HEADACHE, MVP (mitral valve prolapse), Osteoarthritis, Polymyositis (HCC), Pulmonary fibrosis (HCC), Rheumatoid arthritis (HCC), Sleep apnea, Vertigo, Vitamin B 12 deficiency, and Vitamin D  deficiency. here with:  Migraine headaches  Continue Emgality  monthly injection Continue Nurtec for abortive therapy Advised that if her headache frequency or severity worsen she should let us  know I did educate the patient regarding the increased stroke risk with a diagnosis of migraine with aura and estrogen.  The patient is not currently on estrogen but did  offer her education should this come up in the future   Meds ordered this  encounter  Medications   Galcanezumab -gnlm (EMGALITY ) 120 MG/ML SOAJ    Sig: ADMINISTER 1 ML UNDER THE SKIN EVERY 30 DAYS    Dispense:  3 mL    Refill:  4    Supervising Provider:   AHERN, ANTONIA B [[id number]]   No orders of the defined types were placed in this encounter.    Duwaine Russell, MSN, NP-C 01/27/2024, 9:45 AM [facility] [address], [address] [place], [place] [address] [phone]  The patient's condition requires frequent monitoring  and adjustments in the treatment plan, reflecting the ongoing complexity of care.  This provider is the continuing focal point for all needed services for this condition.

## 2024-01-27 NOTE — Patient Instructions (Signed)
 Your Plan:  Continue Emgality  for prevention  Continue Nurtec for abortive therapy If your symptoms worsen or you develop new symptoms please let us  know.       Thank you for coming to see us  at [facility]. I hope we have been able to provide you high quality care today.  You may receive a patient satisfaction survey over the next few weeks. We would appreciate your feedback and comments so that we may continue to improve ourselves and the health of our patients.

## 2024-01-28 DIAGNOSIS — F4310 Post-traumatic stress disorder, unspecified: Secondary | ICD-10-CM | POA: Diagnosis not present

## 2024-02-06 DIAGNOSIS — F4310 Post-traumatic stress disorder, unspecified: Secondary | ICD-10-CM | POA: Diagnosis not present

## 2024-02-17 DIAGNOSIS — F4310 Post-traumatic stress disorder, unspecified: Secondary | ICD-10-CM | POA: Diagnosis not present

## 2024-02-20 DIAGNOSIS — G4733 Obstructive sleep apnea (adult) (pediatric): Secondary | ICD-10-CM | POA: Diagnosis not present

## 2024-02-24 ENCOUNTER — Other Ambulatory Visit: Payer: Self-pay | Admitting: Internal Medicine

## 2024-02-25 DIAGNOSIS — F4310 Post-traumatic stress disorder, unspecified: Secondary | ICD-10-CM | POA: Diagnosis not present

## 2024-03-03 DIAGNOSIS — F4310 Post-traumatic stress disorder, unspecified: Secondary | ICD-10-CM | POA: Diagnosis not present

## 2024-03-12 ENCOUNTER — Other Ambulatory Visit: Payer: Self-pay | Admitting: *Deleted

## 2024-03-12 DIAGNOSIS — M0579 Rheumatoid arthritis with rheumatoid factor of multiple sites without organ or systems involvement: Secondary | ICD-10-CM

## 2024-03-12 DIAGNOSIS — Z79899 Other long term (current) drug therapy: Secondary | ICD-10-CM

## 2024-03-12 DIAGNOSIS — M3320 Polymyositis, organ involvement unspecified: Secondary | ICD-10-CM

## 2024-03-13 ENCOUNTER — Ambulatory Visit: Payer: Self-pay | Admitting: Rheumatology

## 2024-03-13 NOTE — Progress Notes (Signed)
 CBC, CMP and CK are within normal limits.  TB Gold is pending.

## 2024-03-15 ENCOUNTER — Other Ambulatory Visit: Payer: Self-pay | Admitting: Internal Medicine

## 2024-03-17 LAB — COMPREHENSIVE METABOLIC PANEL WITH GFR
AG Ratio: 1.2 (calc) (ref 1.0–2.5)
ALT: 12 U/L (ref 6–29)
AST: 18 U/L (ref 10–35)
Albumin: 4 g/dL (ref 3.6–5.1)
Alkaline phosphatase (APISO): 56 U/L (ref 37–153)
BUN: 11 mg/dL (ref 7–25)
CO2: 28 mmol/L (ref 20–32)
Calcium: 9.3 mg/dL (ref 8.6–10.4)
Chloride: 105 mmol/L (ref 98–110)
Creat: 0.68 mg/dL (ref 0.50–1.03)
Globulin: 3.3 g/dL (ref 1.9–3.7)
Glucose, Bld: 118 mg/dL — ABNORMAL HIGH (ref 65–99)
Potassium: 5 mmol/L (ref 3.5–5.3)
Sodium: 140 mmol/L (ref 135–146)
Total Bilirubin: 0.5 mg/dL (ref 0.2–1.2)
Total Protein: 7.3 g/dL (ref 6.1–8.1)
eGFR: 101 mL/min/1.73m2 (ref >=60)

## 2024-03-17 LAB — CBC WITH DIFFERENTIAL/PLATELET
Absolute Lymphocytes: 1466 {cells}/uL (ref 850–3900)
Absolute Monocytes: 671 {cells}/uL (ref 200–950)
Basophils Absolute: 39 {cells}/uL (ref 0–200)
Basophils Relative: 0.5 %
Eosinophils Absolute: 218 {cells}/uL (ref 15–500)
Eosinophils Relative: 2.8 %
HCT: 39.1 % (ref 35.0–45.0)
Hemoglobin: 12.5 g/dL (ref 11.7–15.5)
MCH: 28 pg (ref 27.0–33.0)
MCHC: 32 g/dL (ref 32.0–36.0)
MCV: 87.7 fL (ref 80.0–100.0)
MPV: 11 fL (ref 7.5–12.5)
Monocytes Relative: 8.6 %
Neutro Abs: 5405 {cells}/uL (ref 1500–7800)
Neutrophils Relative %: 69.3 %
Platelets: 267 Thousand/uL (ref 140–400)
RBC: 4.46 Million/uL (ref 3.80–5.10)
RDW: 12.3 % (ref 11.0–15.0)
Total Lymphocyte: 18.8 %
WBC: 7.8 Thousand/uL (ref 3.8–10.8)

## 2024-03-17 LAB — QUANTIFERON-TB GOLD PLUS
Mitogen-NIL: 7.52 [IU]/mL
NIL: 0.02 [IU]/mL
QuantiFERON-TB Gold Plus: NEGATIVE
TB1-NIL: 0 [IU]/mL
TB2-NIL: 0 [IU]/mL

## 2024-03-17 LAB — CK: Total CK: 64 U/L (ref 21–240)

## 2024-03-17 NOTE — Progress Notes (Signed)
 CBC, CMP, CK are within normal limits except glucose is mildly elevated, probably not a fasting sample.  TB Gold is negative.

## 2024-03-24 ENCOUNTER — Other Ambulatory Visit: Payer: Self-pay

## 2024-03-24 ENCOUNTER — Other Ambulatory Visit: Payer: Self-pay | Admitting: Physician Assistant

## 2024-03-24 ENCOUNTER — Other Ambulatory Visit: Payer: Self-pay | Admitting: Nurse Practitioner

## 2024-03-24 DIAGNOSIS — J471 Bronchiectasis with (acute) exacerbation: Secondary | ICD-10-CM

## 2024-03-24 DIAGNOSIS — J849 Interstitial pulmonary disease, unspecified: Secondary | ICD-10-CM

## 2024-03-24 NOTE — Telephone Encounter (Signed)
 Last Fill: 12/23/2023  Labs: 03/12/2024 CBC, CMP, CK are within normal limits except glucose is mildly elevated, probably not a fasting sample. TB Gold is negative.   Next Visit: 04/08/2024  Last Visit: 01/07/2024  DX:  Rheumatoid arthritis involving multiple sites with positive rheumatoid factor (HCC)   Current Dose per office note 01/07/2024: Imuran  100 mg by mouth daily   Okay to refill Imuran ?

## 2024-03-24 NOTE — Telephone Encounter (Signed)
 Last Fill: 04/23/2022  Next Visit: 04/08/2024  Last Visit: 01/07/2024  Dx: Rheumatoid arthritis involving multiple sites with positive rheumatoid factor   Current Dose per office note on 01/07/2024: dose not discussed  Okay to refill Voltaren  Gel?

## 2024-03-26 NOTE — Progress Notes (Unsigned)
 Office Visit Note  Patient: Jill Harper             Date of Birth: 1966/05/19           MRN: [id number]             PCP: Geofm Glade PARAS, MD Referring: Geofm Glade PARAS, MD Visit Date: 04/08/2024 Occupation: @GUAROCC @  Subjective:  Medication monitoring   History of Present Illness: Jill Harper is a 58 y.o. female with history of  rheumatoid arthritis and polymyositis.  Patient remains on Enbrel  50 mg sq injections once weekly (02/18/20-1st injection) and Imuran  100 mg by mouth daily.  She is tolerating combination therapy without any side effects and has not had any recent gaps in therapy.  Patient states that she has noticed an increased discomfort due to ongoing osteoarthritis in her hands and feet typically with weather change.  She denies any joint swelling at this time.  She has been taking Tylenol  as needed and occasionally will use Voltaren  gel topically for pain relief.  If her symptoms are more severe she will take Aleve or ibuprofen sparingly.  She experiences intermittent myalgias and muscle tenderness due to fibromyalgia.  She denies any signs or symptoms of a myositis flare. Patient reports that she has been out of daycare of her dentist due to an increased frequency of cavities.  Patient states that she will require wisdom teeth removal x 4 and will be establishing care with an oral surgeon.  Patient states that she has noticed increased mouth dryness over the past 6 weeks. Patient states that she had an updated mammogram last week and is otherwise up-to-date with age-related cancer screenings.  Patient states that she does need to schedule an annual skin cancer screening. She denies any recurrent infections. She continues to use a chest vest twice daily which has helped to manage symptoms of bronchiectasis.   Activities of Daily Living:  Patient reports morning stiffness for less than 5 minutes.   Patient Denies nocturnal pain.  Difficulty dressing/grooming: Denies Difficulty  climbing stairs: Reports Difficulty getting out of chair: Denies Difficulty using hands for taps, buttons, cutlery, and/or writing: Reports  Review of Systems  Constitutional:  Positive for fatigue.  HENT:  Positive for mouth dryness. Negative for mouth sores.   Eyes:  Positive for dryness.  Respiratory:  Positive for shortness of breath.   Cardiovascular:  Negative for chest pain and palpitations.  Gastrointestinal:  Negative for blood in stool, constipation and diarrhea.  Endocrine: Negative for increased urination.  Genitourinary:  Negative for involuntary urination.  Musculoskeletal:  Positive for joint pain, joint pain, joint swelling, myalgias, muscle weakness, morning stiffness, muscle tenderness and myalgias. Negative for gait problem.  Skin:  Positive for color change and sensitivity to sunlight. Negative for rash and hair loss.  Allergic/Immunologic: Positive for susceptible to infections.  Neurological:  Positive for headaches. Negative for dizziness.  Hematological:  Positive for swollen glands.  Psychiatric/Behavioral:  Negative for depressed mood and sleep disturbance. The patient is not nervous/anxious.     PMFS History:  Patient Active Problem List   Diagnosis Date Noted   Chronic hypoxemic respiratory failure (HCC) 10/06/2023   Wheezing 10/04/2023   Moderate persistent asthma without complication 08/27/2023   Subclinical hypothyroidism 05/31/2023   Osteoporosis 03/27/2023   Rib pain on left side 01/08/2023   Back pain 11/21/2022   Left arm pain 09/13/2022   Left arm swelling 09/13/2022   Right upper quadrant pain 08/15/2022  Palpitations-related to MVP 04/02/2022   Nocturnal hypoxia 01/16/2022   Fibromuscular dysplasia of wall of intracranial artery (HCC) 12/20/2021   Anxiety 09/06/2021   Tingling 06/28/2021   Occipital neuralgia of left side 01/06/2020   Metabolic syndrome X 07/28/2019   Entrapment of left ulnar nerve 07/27/2019   Right hand pain  07/27/2019   Bilateral carpal tunnel syndrome 07/21/2019   Costochondritis 03/16/2019   Rib pain on right side 03/16/2019   Prediabetes 11/27/2018   Fibromuscular dysplasia (HCC) 10/17/2018   Hypertension 10/17/2018   Bronchiectasis with (acute) exacerbation (HCC) 10/08/2018   OSA (obstructive sleep apnea) 08/20/2018   Rheumatoid arthritis (HCC) - Dr Dolphus 04/17/2018   ILD (interstitial lung disease) (HCC) 04/17/2018   Dyspnea 04/01/2018   Bronchiectasis (HCC) 09/02/2017   LVH (left ventricular hypertrophy), mild 06/02/2017   Mitral regurgitation 05/22/2017   Jo-1 antibody positive 01/29/2017   Primary osteoarthritis of both feet 01/29/2017   Primary osteoarthritis of both hands 01/29/2017   Left shoulder tendonitis 01/17/2017   Vitamin D  deficiency 04/26/2016   Morbid obesity with BMI of 45.0-49.9, adult (HCC) 03/26/2016   Cough 07/18/2015   MVP (mitral valve prolapse)    Erythema nodosum    INCONTINENCE, URGE 05/26/2010   Migraine headache 02/15/2010   Fibromyalgia 06/20/2009   Vertigo 03/05/2008   Polymyositis (HCC) 02/11/2008   BRONCHIECTASIS 02/10/2008    Past Medical History:  Diagnosis Date   Abnormal liver function test    Bronchiectasis        Dyspnea    Fibromuscular dysplasia (HCC)    Fibromyalgia    Heart murmur    ILD (interstitial lung disease) (HCC)    Joint pain    Menorrhagia    MIGRAINE HEADACHE    MVP (mitral valve prolapse)    Osteoarthritis    Polymyositis (HCC)    Dr Elsie Chol; chronic MTX   Pulmonary fibrosis (HCC)    Rheumatoid arthritis (HCC)    Sleep apnea    Vertigo    Vitamin B 12 deficiency    Vitamin D  deficiency     Family History  Problem Relation Age of Onset   Diabetes Mother    Fibromyalgia Mother    Ulcers Mother    Heart failure Mother    Thyroid  disease Mother    Obesity Mother    Multiple myeloma Father    Hypertension Father    Stroke Father    Lupus Sister    Other Sister        abdominal adhesions  resulting in bowel obstruction   Migraines Sister    Hypertension Sister    Rheum arthritis Sister    Multiple myeloma Sister    Hypertension Sister    Osteoarthritis Sister    Heart attack Sister    Diabetes Sister    Hypertension Sister    Diabetes Sister    Kidney disease Sister    Heart disease Brother        bypass surgery   Diabetes Brother    Diabetes Brother    Heart disease Brother    Diabetes Brother    Stroke Brother    Healthy Daughter    Headache Other        siblings with headaches but not diagnosed as migraines   Past Surgical History:  Procedure Laterality Date   ablation uterine  2010   CARDIAC CATHETERIZATION  2002   normal   DILATION AND CURETTAGE OF UTERUS  08-31-08   Dr Truman Corona   IR  ANGIO INTRA EXTRACRAN SEL COM CAROTID INNOMINATE BILAT MOD SED  09/18/2018   IR ANGIO VERTEBRAL SEL VERTEBRAL BILAT MOD SED  09/18/2018   IR US  GUIDE VASC ACCESS RIGHT  09/18/2018   Social History   Social History Narrative   Exercise: trying to walk - limited by fatigue   Lives at home with husband    Right handed   Caffeine: none    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  Administered Date(s) Administered   Influenza Split 04/10/2011, 06/18/2012, 05/06/2013   Influenza Whole 05/19/2008, 05/15/2009, 06/01/2010   Influenza, Seasonal, Injecte, Preservative Fre 05/31/2023   Influenza,inj,Quad PF,6+ Mos 04/20/2014, 05/26/2015, 05/31/2016, 04/05/2017, 06/16/2018, 04/22/2019, 04/22/2020   Influenza-Unspecified 04/11/2021, 04/02/2022   PFIZER(Purple Top)SARS-COV-2 Vaccination 10/27/2019, 11/19/2019   Pneumococcal Conjugate-13 01/29/2017   Pneumococcal Polysaccharide-23 08/07/2007, 06/16/2018   Td 07/12/2009   Tdap 01/06/2020   Unspecified SARS-COV-2 Vaccination 10/27/2019, 11/19/2019, 04/04/2021   Zoster, Live 06/06/2022     Objective: Vital Signs: BP 119/76 (BP Location: Left Arm, Patient Position: Sitting, Cuff Size: Large)   Pulse 83   Resp 14   Ht 5' 8 (1.727  m)   Wt (!) 309 lb (140.2 kg)   BMI 46.98 kg/m    Physical Exam Vitals and nursing note reviewed.  Constitutional:      Appearance: She is well-developed.  HENT:     Head: Normocephalic and atraumatic.  Eyes:     Conjunctiva/sclera: Conjunctivae normal.  Cardiovascular:     Rate and Rhythm: Normal rate and regular rhythm.     Heart sounds: Normal heart sounds.  Pulmonary:     Effort: Pulmonary effort is normal.     Breath sounds: Normal breath sounds.  Abdominal:     General: Bowel sounds are normal.     Palpations: Abdomen is soft.  Musculoskeletal:     Cervical back: Normal range of motion.  Lymphadenopathy:     Cervical: No cervical adenopathy.  Skin:    General: Skin is warm and dry.     Capillary Refill: Capillary refill takes less than 2 seconds.  Neurological:     Mental Status: She is alert and oriented to person, place, and time.  Psychiatric:        Behavior: Behavior normal.      Musculoskeletal Exam: C-spine has good range of motion.  Trapezius muscle tension and tenderness bilaterally.  Shoulder joints, elbow joints, wrist joints, MCPs, PIPs, DIPs have good range of motion with no synovitis.  Complete fist formation bilaterally.  Some tenderness over the left second MCP joint but no synovitis noted.  Hip joints have good range of motion with no groin pain.  Knee joints have good range of motion no warmth or effusion.  Ankle joints have good range of motion no tenderness or joint swelling.  CDAI Exam: CDAI Score: -- Patient Global: --; Provider Global: -- Swollen: --; Tender: -- Joint Exam 04/08/2024   No joint exam has been documented for this visit   There is currently no information documented on the homunculus. Go to the Rheumatology activity and complete the homunculus joint exam.  Investigation: No additional findings.  Imaging: No results found.  Recent Labs: Lab Results  Component Value Date   WBC 7.8 03/12/2024   HGB 12.5 03/12/2024    PLT 267 03/12/2024   NA 140 03/12/2024   K 5.0 03/12/2024   CL 105 03/12/2024   CO2 28 03/12/2024   GLUCOSE 118 (H) 03/12/2024   BUN 11 03/12/2024   CREATININE  0.68 03/12/2024   BILITOT 0.5 03/12/2024   ALKPHOS 45 09/09/2023   AST 18 03/12/2024   ALT 12 03/12/2024   PROT 7.3 03/12/2024   ALBUMIN 3.9 09/09/2023   CALCIUM 9.3 03/12/2024   GFRAA 114 01/18/2021   QFTBGOLDPLUS NEGATIVE 03/12/2024    Speciality Comments: Prior therapy includes: MTX (d/c due to ILD progresssion)  Procedures:  No procedures performed Allergies: Sulfonamide derivatives      Assessment / Plan:     Visit Diagnoses: Rheumatoid arthritis involving multiple sites with positive rheumatoid factor (HCC) - +RF, +CCP: She has no synovitis on examination today.  She has not had any signs or symptoms of a flare.  She has clinically been doing well on Enbrel  50 mg subcu days injections once weekly and Imuran  100 mg daily.  She is tolerating combination therapy without any side effects and has not had any recent gaps in therapy.  No medication changes will be made at this time.  She was advised to notify us  if she develops any signs or symptoms of a flare.  She will follow-up in the office in 3 months or sooner if needed.  High risk medication use - Enbrel  50 mg sq injections once weekly (02/18/20-1st injection) and Imuran  100 mg by mouth daily. d/c MTX due to progressioin of ILD. CBC and CMP updated on 03/12/24.  Her next lab work will be due in November and every 3 months.  TB gold negative on 03/12/24.   Lipid panel updated on 09/09/23.  Discussed the importance of holding enbrel  and imuran  if she develops signs or symptoms of an infection and to resume once the infection has completely cleared.  Patient plans on proceeding with wisdom tooth extractions and is aware that she should hold Enbrel  and imuran  prior to the procedure-she will require clearance by the oral surgeon prior to resuming therapy if there is no signs of  delayed healing or infection. Patient plans on scheduling an annual skin cancer screening.  Polymyositis (HCC) - Dx by Dr. Everlean 2007, CK 801, Jo-1 positive: CK has been within normal limits: She has not had any signs or symptoms of a myositis flare.  She is clinically been doing well on the current treatment regimen.  She has not had any recent gaps in therapy.  CK was within normal limits on 03/12/2024.  No medication changes will be made at this time.  She is advised to notify us  if she develops any signs or symptoms of a myositis flare.  She will continue to require CK every 3 months for close monitoring. Discussed the importance of age-related cancer screenings due to the increased risk for malignancy in patients with myositis.  She voiced understanding she had updated mammogram last week and is planning to schedule her annual skin cancer screening.  Jo-1 antibody positive  ILD (interstitial lung disease) (HCC) - She is followed by Dr. Shellia at [facility].  Completed pulmonary rehab.  Bronchiectasis without complication (HCC) - Followed by Dr. Shellia:  She has been using the chest vest twice daily which she finds to be helpful.  Bilateral carpal tunnel syndrome: Not currently symptomatic.  Raynaud's syndrome without gangrene: Not currently symptomatic.  No signs of sclerodactyly.  Primary osteoarthritis of both hands: Patient continues to experience intermittent arthralgias and joint stiffness in both hands due to underlying osteoarthritis.  She has no synovitis on examination today.  Her symptoms due to underlying osteoarthritis have been exacerbated by weather change.  Discussed the importance of joint  protection and muscle strengthening.  She plans on continuing to take Tylenol  as needed and using Voltaren  gel as needed for pain relief.  Primary osteoarthritis of both feet: She has good range of motion of both ankle joints with no tenderness or synovitis.  She is wearing proper fitting  shoes.  Fibromyalgia: Patient continues to experience intermittent myalgias and muscle tenderness due to fibromyalgia.  She takes methocarbamol  as needed for muscle spasms and remains on Cymbalta  as prescribed.  Trapezius muscle spasm: She takes methocarbamol  500 mg 1 tablet every 8 hours as needed for muscle spasms.  Other fatigue - Related to ILD and myofascial pain.  History of osteopenia - Followed by gynecologist.  She is taking fosamax 70 mg 1 tablet by mouth once weekly.  Vitamin D  deficiency: She is taking vitamin D  2000 units daily.  Other medical conditions are listed as follows:  Fibromuscular dysplasia (HCC) - multifocal fibromuscular dysplasia of the bilateral internal carotid arteries-confirmed by catheter-based angiography-Followed by Dr. Luke.  Erythema nodosum  History of migraine  History of mitral valve prolapse  Orders: No orders of the defined types were placed in this encounter.  No orders of the defined types were placed in this encounter.    Follow-Up Instructions: Return in about 3 months (around 07/08/2024) for Rheumatoid arthritis, Polymyositis.   Waddell CHRISTELLA Craze, PA-C  Note - This record has been created using Dragon software.  Chart creation errors have been sought, but may not always  have been located. Such creation errors do not reflect on  the standard of medical care.

## 2024-03-31 LAB — HM MAMMOGRAPHY

## 2024-04-07 ENCOUNTER — Other Ambulatory Visit: Payer: Self-pay | Admitting: Physician Assistant

## 2024-04-07 DIAGNOSIS — M0579 Rheumatoid arthritis with rheumatoid factor of multiple sites without organ or systems involvement: Secondary | ICD-10-CM

## 2024-04-08 ENCOUNTER — Encounter: Payer: Self-pay | Admitting: Physician Assistant

## 2024-04-08 ENCOUNTER — Ambulatory Visit: Attending: Physician Assistant | Admitting: Physician Assistant

## 2024-04-08 VITALS — BP 119/76 | HR 83 | Resp 14 | Ht 68.0 in | Wt 309.0 lb

## 2024-04-08 DIAGNOSIS — R5383 Other fatigue: Secondary | ICD-10-CM

## 2024-04-08 DIAGNOSIS — M0579 Rheumatoid arthritis with rheumatoid factor of multiple sites without organ or systems involvement: Secondary | ICD-10-CM

## 2024-04-08 DIAGNOSIS — M3320 Polymyositis, organ involvement unspecified: Secondary | ICD-10-CM

## 2024-04-08 DIAGNOSIS — M797 Fibromyalgia: Secondary | ICD-10-CM

## 2024-04-08 DIAGNOSIS — Z8679 Personal history of other diseases of the circulatory system: Secondary | ICD-10-CM

## 2024-04-08 DIAGNOSIS — L52 Erythema nodosum: Secondary | ICD-10-CM

## 2024-04-08 DIAGNOSIS — Z8669 Personal history of other diseases of the nervous system and sense organs: Secondary | ICD-10-CM

## 2024-04-08 DIAGNOSIS — M62838 Other muscle spasm: Secondary | ICD-10-CM

## 2024-04-08 DIAGNOSIS — G5603 Carpal tunnel syndrome, bilateral upper limbs: Secondary | ICD-10-CM

## 2024-04-08 DIAGNOSIS — E559 Vitamin D deficiency, unspecified: Secondary | ICD-10-CM

## 2024-04-08 DIAGNOSIS — M19072 Primary osteoarthritis, left ankle and foot: Secondary | ICD-10-CM

## 2024-04-08 DIAGNOSIS — I7300 Raynaud's syndrome without gangrene: Secondary | ICD-10-CM

## 2024-04-08 DIAGNOSIS — R768 Other specified abnormal immunological findings in serum: Secondary | ICD-10-CM | POA: Diagnosis not present

## 2024-04-08 DIAGNOSIS — I773 Arterial fibromuscular dysplasia: Secondary | ICD-10-CM

## 2024-04-08 DIAGNOSIS — J479 Bronchiectasis, uncomplicated: Secondary | ICD-10-CM

## 2024-04-08 DIAGNOSIS — Z79899 Other long term (current) drug therapy: Secondary | ICD-10-CM | POA: Diagnosis not present

## 2024-04-08 DIAGNOSIS — M19071 Primary osteoarthritis, right ankle and foot: Secondary | ICD-10-CM

## 2024-04-08 DIAGNOSIS — J849 Interstitial pulmonary disease, unspecified: Secondary | ICD-10-CM

## 2024-04-08 DIAGNOSIS — M19041 Primary osteoarthritis, right hand: Secondary | ICD-10-CM

## 2024-04-08 DIAGNOSIS — M19042 Primary osteoarthritis, left hand: Secondary | ICD-10-CM

## 2024-04-08 DIAGNOSIS — Z8739 Personal history of other diseases of the musculoskeletal system and connective tissue: Secondary | ICD-10-CM

## 2024-04-08 NOTE — Patient Instructions (Signed)
 Standing Labs We placed an order today for your standing lab work.   Please have your standing labs drawn in November and every 3 months   Please have your labs drawn 2 weeks prior to your appointment so that the provider can discuss your lab results at your appointment, if possible.  Please note that you may see your imaging and lab results in MyChart before we have reviewed them. We will contact you once all results are reviewed. Please allow our office up to 72 hours to thoroughly review all of the results before contacting the office for clarification of your results.  WALK-IN LAB HOURS  Monday through Thursday from 8:00 am -12:30 pm and 1:00 pm-4:30 pm and Friday from 8:00 am-12:00 pm.  Patients with office visits requiring labs will be seen before walk-in labs.  You may encounter longer than normal wait times. Please allow additional time. Wait times may be shorter on  Monday and Thursday afternoons.  We do not book appointments for walk-in labs. We appreciate your patience and understanding with our staff.   Labs are drawn by [facility]. Please bring your co-pay at the time of your lab draw.  You may receive a bill from [facility] for your lab work.  Please note if you are on Hydroxychloroquine  and and an order has been placed for a Hydroxychloroquine  level,  you will need to have it drawn 4 hours or more after your last dose.  If you wish to have your labs drawn at another location, please call the office 24 hours in advance so we can fax the orders.  The office is located at [address], [place], [place] [address]   If you have any questions regarding directions or hours of operation,  please call [phone].   As a reminder, please drink plenty of water prior to coming for your lab work. Thanks!

## 2024-04-08 NOTE — Telephone Encounter (Signed)
 Last Fill: 01/22/2024  Labs: 03/12/2024 CBC, CMP and CK are within normal limits   TB Gold: 03/12/2024 Neg    Next Visit: 04/08/2024  Last Visit: 01/07/2024  IK:Myzlfjunpi arthritis involving multiple sites with positive rheumatoid factor   Current Dose per office note 01/07/2024: Enbrel  50 mg sq injections once weekly   Okay to refill Enbrel ?

## 2024-04-10 ENCOUNTER — Telehealth: Payer: Self-pay | Admitting: Pharmacist

## 2024-04-10 NOTE — Telephone Encounter (Signed)
 Pharmacy Patient Advocate Encounter   Received notification from CoverMyMeds that prior authorization for Nurtec 75MG  dispersible tablets is required/requested.   Insurance verification completed.   The patient is insured through [employer] [facility] .   Per test claim: PA required; PA submitted to above mentioned insurance via Latent Key/confirmation #/[id number] Status is pending

## 2024-04-13 NOTE — Telephone Encounter (Signed)
 Pharmacy Patient Advocate Encounter  Received notification from [employer] [facility] that Prior Authorization for Nurtec has been APPROVED from 03/11/2024 to 04/10/2025   PA #/Case ID/Reference #: [id number]

## 2024-04-16 ENCOUNTER — Telehealth: Payer: Self-pay | Admitting: Pharmacy Technician

## 2024-04-16 ENCOUNTER — Encounter (HOSPITAL_COMMUNITY): Payer: Self-pay

## 2024-04-16 ENCOUNTER — Other Ambulatory Visit (HOSPITAL_COMMUNITY): Payer: Self-pay

## 2024-04-16 ENCOUNTER — Ambulatory Visit (HOSPITAL_COMMUNITY)
Admission: EM | Admit: 2024-04-16 | Discharge: 2024-04-16 | Disposition: A | Discharge status: Home / Self Care | Attending: Emergency Medicine | Admitting: Emergency Medicine

## 2024-04-16 DIAGNOSIS — R52 Pain, unspecified: Secondary | ICD-10-CM | POA: Diagnosis not present

## 2024-04-16 DIAGNOSIS — Z20822 Contact with and (suspected) exposure to covid-19: Secondary | ICD-10-CM

## 2024-04-16 LAB — POC SARS CORONAVIRUS 2 AG -  ED: SARS Coronavirus 2 Ag: NEGATIVE

## 2024-04-16 NOTE — Telephone Encounter (Signed)
 Pharmacy Patient Advocate Encounter  Received notification from [employer] [facility] that Prior Authorization for Emgality  120MG /ML auto-injectors (migraine)  has been CANCELLED due toYour PA request has been closed. NO PA NEEDED CURENT PA ON FILE    PA #/Case ID/Reference #: [id number]

## 2024-04-16 NOTE — ED Triage Notes (Signed)
 Patient here today with c/o body aches, chills, fatigue, headache, and loss of appetite X 1 day. She has been taking Tylenol  and Advil with some relief. Sunday evening she was with some friends and those friends tested positive for Covid.

## 2024-04-16 NOTE — Discharge Instructions (Signed)
 COVID testing was negative today in clinic.  If you continue to have generalized bodyaches, develop fever, headache, and severe sore throat consider retesting at home.  Alternate between 600 mg of ibuprofen and 500 mg of Tylenol  every 4-6 hours for body aches and headache.  Ensure you are staying well-hydrated and getting plenty of rest.  Return to clinic for new or urgent symptoms.

## 2024-04-16 NOTE — ED Provider Notes (Signed)
 [facility]    CSN: [id number] Arrival date & time: 04/16/24  1320      History   Chief Complaint Chief Complaint  Patient presents with   Generalized Body Aches    HPI Jill Harper is a 58 y.o. female.   Patient presents to clinic over concern of generalized bodyaches, chills, fatigue and a headache with diminished appetite that started yesterday.  Patient does suffer from autoimmune conditions and is unsure if this is a flareup of her RA or if this is viral in nature.  Over the weekend her and her husband went out to dinner with another couple, the next day they called to let her know that they tested positive for COVID-19.  Does not have any fevers.  The history is provided by the patient and medical records.    Past Medical History:  Diagnosis Date   Abnormal liver function test    Bronchiectasis        Dyspnea    Fibromuscular dysplasia (HCC)    Fibromyalgia    Heart murmur    ILD (interstitial lung disease) (HCC)    Joint pain    Menorrhagia    MIGRAINE HEADACHE    MVP (mitral valve prolapse)    Osteoarthritis    Polymyositis (HCC)    Dr Elsie Chol; chronic MTX   Pulmonary fibrosis (HCC)    Rheumatoid arthritis (HCC)    Sleep apnea    Vertigo    Vitamin B 12 deficiency    Vitamin D  deficiency     Patient Active Problem List   Diagnosis Date Noted   Chronic hypoxemic respiratory failure (HCC) 10/06/2023   Wheezing 10/04/2023   Moderate persistent asthma without complication 08/27/2023   Subclinical hypothyroidism 05/31/2023   Osteoporosis 03/27/2023   Rib pain on left side 01/08/2023   Back pain 11/21/2022   Left arm pain 09/13/2022   Left arm swelling 09/13/2022   Right upper quadrant pain 08/15/2022   Palpitations-related to MVP 04/02/2022   Nocturnal hypoxia 01/16/2022   Fibromuscular dysplasia of wall of intracranial artery (HCC) 12/20/2021   Anxiety 09/06/2021   Tingling 06/28/2021   Occipital neuralgia of left side  01/06/2020   Metabolic syndrome X 07/28/2019   Entrapment of left ulnar nerve 07/27/2019   Right hand pain 07/27/2019   Bilateral carpal tunnel syndrome 07/21/2019   Costochondritis 03/16/2019   Rib pain on right side 03/16/2019   Prediabetes 11/27/2018   Fibromuscular dysplasia (HCC) 10/17/2018   Hypertension 10/17/2018   Bronchiectasis with (acute) exacerbation (HCC) 10/08/2018   OSA (obstructive sleep apnea) 08/20/2018   Rheumatoid arthritis (HCC) - Dr Dolphus 04/17/2018   ILD (interstitial lung disease) (HCC) 04/17/2018   Dyspnea 04/01/2018   Bronchiectasis (HCC) 09/02/2017   LVH (left ventricular hypertrophy), mild 06/02/2017   Mitral regurgitation 05/22/2017   Jo-1 antibody positive 01/29/2017   Primary osteoarthritis of both feet 01/29/2017   Primary osteoarthritis of both hands 01/29/2017   Left shoulder tendonitis 01/17/2017   Vitamin D  deficiency 04/26/2016   Morbid obesity with BMI of 45.0-49.9, adult (HCC) 03/26/2016   Cough 07/18/2015   MVP (mitral valve prolapse)    Erythema nodosum    INCONTINENCE, URGE 05/26/2010   Migraine headache 02/15/2010   Fibromyalgia 06/20/2009   Vertigo 03/05/2008   Polymyositis (HCC) 02/11/2008   BRONCHIECTASIS 02/10/2008    Past Surgical History:  Procedure Laterality Date   ablation uterine  2010   CARDIAC CATHETERIZATION  2002   normal   DILATION AND  CURETTAGE OF UTERUS  08-31-08   Dr Truman Corona   IR ANGIO INTRA EXTRACRAN SEL COM CAROTID INNOMINATE BILAT MOD SED  09/18/2018   IR ANGIO VERTEBRAL SEL VERTEBRAL BILAT MOD SED  09/18/2018   IR US  GUIDE VASC ACCESS RIGHT  09/18/2018    OB History     Gravida  1   Para      Term      Preterm      AB      Living  1      SAB      IAB      Ectopic      Multiple      Live Births               Home Medications    Prior to Admission medications   Medication Sig Start Date End Date Taking? Authorizing Provider  albuterol  (PROVENTIL ) (2.5 MG/3ML)  0.083% nebulizer solution Take 3 mLs (2.5 mg total) by nebulization every 6 (six) hours as needed for wheezing or shortness of breath. 05/16/18   Shellia Oh, MD  albuterol  (VENTOLIN  HFA) 108 (90 Base) MCG/ACT inhaler INHALE 2 PUFFS BY MOUTH INTO THE LUNGS FOUR TIMES DAILY AS NEEDED 03/26/24   Cobb, Katherine V, NP  alendronate (FOSAMAX) 70 MG tablet Take 1 tablet by mouth once a week. 04/16/23   [provider]  aspirin EC 81 MG tablet Take 81 mg by mouth daily.    [provider]  atenolol  (TENORMIN ) 25 MG tablet TAKE 1 TABLET(25 MG) BY MOUTH DAILY 11/04/23   Geofm Glade PARAS, MD  azaTHIOprine  (IMURAN ) 50 MG tablet TAKE 2 TABLETS BY MOUTH DAILY 03/24/24   Dolphus Reiter, MD  Cholecalciferol (VITAMIN D ) 50 MCG (2000 UT) CAPS Take by mouth.    [provider]  diclofenac  Sodium (VOLTAREN ) 1 % GEL APPLY 2 TO 4 GRAMS TOPICALLY TO AFFECTED JOINT FOUR TIMES DAILY AS NEEDED 03/24/24   Dolphus Reiter, MD  DULoxetine  (CYMBALTA ) 30 MG capsule TAKE ONE CAPSULE BY MOUTH DAILY FOR TOAL OF 90 MG DAILY 03/16/24   Burns, Glade PARAS, MD  DULoxetine  (CYMBALTA ) 60 MG capsule TAKE 1 CAPSULE(60 MG) BY MOUTH DAILY 01/17/24   Geofm Glade PARAS, MD  ENBREL  MINI 50 MG/ML injection INSERT 1 MINI CARTRIDGE INTO AUTOINJECTOR AND INJECT 50 MG UNDER THE SKIN EVERY 7 DAYS 04/08/24   Cheryl Waddell HERO, PA-C  gabapentin  (NEURONTIN ) 100 MG capsule Take 3 capsules (300 mg total) by mouth at bedtime. TAKE 3 CAPSULES(300 MG) BY MOUTH AT BEDTIME 05/16/23   Millikan, Megan, NP  Galcanezumab -gnlm (EMGALITY ) 120 MG/ML SOAJ ADMINISTER 1 ML UNDER THE SKIN EVERY 30 DAYS 01/27/24   Millikan, Megan, NP  guaiFENesin  (MUCINEX ) 600 MG 12 hr tablet Take 2 tablets (1,200 mg total) by mouth 2 (two) times daily as needed for cough or to loosen phlegm. 05/16/18   Sood, Vineet, MD  meclizine  (ANTIVERT ) 25 MG tablet Take 1 tablet (25 mg total) by mouth 3 (three) times daily as needed for dizziness. 06/27/22   Geofm Glade PARAS, MD   methocarbamol  (ROBAXIN ) 500 MG tablet TAKE 1 TABLET(500 MG) BY MOUTH EVERY 8 HOURS AS NEEDED 02/24/24   Geofm Glade PARAS, MD  Multiple Vitamins-Minerals (MULTIVITAMIN GUMMIES ADULT PO) Take 2 each by mouth daily.    [provider]  Rimegepant Sulfate (NURTEC) 75 MG TBDP DISSOLVE ONE TABLET BY MOUTH DAILY AS NEEDED FOR MIGRAINES. TAKE AS CLOSE TO ONSET OF MIGRAINE AS POSSIBLE. 1 TABLET DAILY MAXIMUM.  05/16/23   Sherryl Bouchard, NP  sodium chloride  HYPERTONIC 3 % nebulizer solution Take 3 mL Twice daily via nebulizer as needed for cough/congestion 07/01/23   Cobb, Comer GAILS, NP  telmisartan  (MICARDIS ) 20 MG tablet TAKE 1 TABLET(20 MG) BY MOUTH DAILY 10/24/23   Geofm Glade PARAS, MD  TRELEGY ELLIPTA  100-62.5-25 MCG/ACT AEPB INHALE 1 PUFF INTO THE LUNGS DAILY 03/26/24   Cobb, Comer GAILS, NP    Family History Family History  Problem Relation Age of Onset   Diabetes Mother    Fibromyalgia Mother    Ulcers Mother    Heart failure Mother    Thyroid  disease Mother    Obesity Mother    Multiple myeloma Father    Hypertension Father    Stroke Father    Lupus Sister    Other Sister        abdominal adhesions resulting in bowel obstruction   Migraines Sister    Hypertension Sister    Rheum arthritis Sister    Multiple myeloma Sister    Hypertension Sister    Osteoarthritis Sister    Heart attack Sister    Diabetes Sister    Hypertension Sister    Diabetes Sister    Kidney disease Sister    Heart disease Brother        bypass surgery   Diabetes Brother    Diabetes Brother    Heart disease Brother    Diabetes Brother    Stroke Brother    Healthy Daughter    Headache Other        siblings with headaches but not diagnosed as migraines    Social History Social History   Tobacco Use   Smoking status: Never    Passive exposure: Never   Smokeless tobacco: Never   Tobacco comments:    Married, lives with spouse. 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 [employer]  Vaping Use   Vaping status: Never Used  Substance Use Topics   Alcohol use: Not Currently    Comment: maybe a wine cooler once every other year   Drug use: Never     Allergies   Sulfa antibiotics and Sulfonamide derivatives   Review of Systems Review of Systems  Per HPI  Physical Exam Triage Vital Signs ED Triage Vitals  Encounter Vitals Group     BP 04/16/24 1416 127/68     Girls Systolic BP Percentile --      Girls Diastolic BP Percentile --      Boys Systolic BP Percentile --      Boys Diastolic BP Percentile --      Pulse Rate 04/16/24 1416 64     Resp 04/16/24 1416 16     Temp 04/16/24 1416 98.9 F (37.2 C)     Temp Source 04/16/24 1416 Oral     SpO2 04/16/24 1416 94 %     Weight --      Height --      Head Circumference --      Peak Flow --      Pain Score 04/16/24 1417 6     Pain Loc --      Pain Education --      Exclude from Growth Chart --    No data found.  Updated Vital Signs BP 127/68 (BP Location: Right Arm)   Pulse 64   Temp 98.9 F (37.2 C) (Oral)   Resp 16   SpO2 94%   Visual Acuity Right  Eye Distance:   Left Eye Distance:   Bilateral Distance:    Right Eye Near:   Left Eye Near:    Bilateral Near:     Physical Exam Vitals and nursing note reviewed.  Constitutional:      Appearance: Normal appearance.  HENT:     Head: Normocephalic and atraumatic.     Right Ear: External ear normal.     Left Ear: External ear normal.     Nose: Nose normal.     Mouth/Throat:     Mouth: Mucous membranes are moist.     Pharynx: Posterior oropharyngeal erythema present.  Eyes:     Conjunctiva/sclera: Conjunctivae normal.  Cardiovascular:     Rate and Rhythm: Normal rate and regular rhythm.     Heart sounds: Normal heart sounds. No murmur heard. Pulmonary:     Effort: Pulmonary effort is normal. No respiratory distress.     Breath sounds: Normal breath sounds.  Skin:    General: Skin is warm and dry.  Neurological:     General:  No focal deficit present.     Mental Status: She is alert and oriented to person, place, and time.  Psychiatric:        Mood and Affect: Mood normal.        Behavior: Behavior normal.      UC Treatments / Results  Labs (all labs ordered are listed, but only abnormal results are displayed) Labs Reviewed  POC SARS CORONAVIRUS 2 AG -  ED    EKG   Radiology No results found.  Procedures Procedures (including critical care time)  Medications Ordered in UC Medications - No data to display  Initial Impression / Assessment and Plan / UC Course  I have reviewed the triage vital signs and the nursing notes.  Pertinent labs & imaging results that were available during my care of the patient were reviewed by me and considered in my medical decision making (see chart for details).  Vitals and triage reviewed, patient is hemodynamically stable.  Lungs vesicular, heart with regular rate and rhythm.  Patient here pharynx erythema present on exam.  POC COVID testing negative.  Discussed presentation could be viral or flareup of autoimmune conditions.  Symptomatic management discussed.  Plan of care, follow-up care return precautions given, no questions at this time.    Final Clinical Impressions(s) / UC Diagnoses   Final diagnoses:  Exposure to COVID-19 virus  Generalized body aches     Discharge Instructions      COVID testing was negative today in clinic.  If you continue to have generalized bodyaches, develop fever, headache, and severe sore throat consider retesting at home.  Alternate between 600 mg of ibuprofen and 500 mg of Tylenol  every 4-6 hours for body aches and headache.  Ensure you are staying well-hydrated and getting plenty of rest.  Return to clinic for new or urgent symptoms.    ED Prescriptions   None    PDMP not reviewed this encounter.   Dreama, Patrisha Hausmann  N, FNP 04/16/24 1506

## 2024-04-16 NOTE — Telephone Encounter (Signed)
 Pharmacy Patient Advocate Encounter   Received notification from Fax that prior authorization for Emgality  120MG /ML auto-injectors (migraine)  is required/requested.   Insurance verification completed.   The patient is insured through [facility] .   Per test claim: PA required; PA started via CoverMyMeds. KEY [id number] . Waiting for clinical questions to populate.

## 2024-04-16 NOTE — Telephone Encounter (Signed)
 Clinical Questions have been submitted

## 2024-05-01 ENCOUNTER — Other Ambulatory Visit (HOSPITAL_COMMUNITY): Payer: Self-pay

## 2024-05-01 ENCOUNTER — Telehealth: Payer: Self-pay

## 2024-05-04 ENCOUNTER — Other Ambulatory Visit (HOSPITAL_COMMUNITY): Payer: Self-pay

## 2024-05-04 NOTE — Telephone Encounter (Signed)
 Pharmacy Patient Advocate Encounter   Received notification from CoverMyMeds that prior authorization for Emgality  is required/requested.   Insurance verification completed.   The patient is insured through [employer] .   Per test claim: PA required; PA submitted to above mentioned insurance via Fax Key/confirmation #/EOC N/A Status is pending  Faxed PA form along with clinicals to [phone]

## 2024-05-06 NOTE — Telephone Encounter (Signed)
 Pharmacy Patient Advocate Encounter  Received notification from [facility] that Prior Authorization for Emgality  has been APPROVED from 04/04/2024 to 05/04/2025   PA #/Case ID/Reference #: [id number]/A

## 2024-05-26 ENCOUNTER — Ambulatory Visit (INDEPENDENT_AMBULATORY_CARE_PROVIDER_SITE_OTHER)

## 2024-05-26 ENCOUNTER — Ambulatory Visit: Attending: Cardiology | Admitting: Cardiology

## 2024-05-26 ENCOUNTER — Encounter: Payer: Self-pay | Admitting: Cardiology

## 2024-05-26 VITALS — BP 118/80 | HR 72 | Ht 68.0 in | Wt 304.2 lb

## 2024-05-26 DIAGNOSIS — R002 Palpitations: Secondary | ICD-10-CM | POA: Insufficient documentation

## 2024-05-26 DIAGNOSIS — I773 Arterial fibromuscular dysplasia: Secondary | ICD-10-CM | POA: Insufficient documentation

## 2024-05-26 DIAGNOSIS — R011 Cardiac murmur, unspecified: Secondary | ICD-10-CM | POA: Diagnosis not present

## 2024-05-26 DIAGNOSIS — Z6841 Body Mass Index (BMI) 40.0 and over, adult: Secondary | ICD-10-CM | POA: Diagnosis present

## 2024-05-26 DIAGNOSIS — I517 Cardiomegaly: Secondary | ICD-10-CM | POA: Diagnosis present

## 2024-05-26 DIAGNOSIS — I10 Essential (primary) hypertension: Secondary | ICD-10-CM | POA: Insufficient documentation

## 2024-05-26 DIAGNOSIS — E782 Mixed hyperlipidemia: Secondary | ICD-10-CM | POA: Diagnosis present

## 2024-05-26 NOTE — Progress Notes (Signed)
 Cardiology Office Note:    Date:  05/26/2024   ID:  Jill Harper, DOB 1966/03/29, MRN [id number]  PCP:  Jill Glade PARAS, MD  Cardiologist:  Jill Huntsman, DO  Electrophysiologist:  None   Referring MD: Jill Glade PARAS, MD   ' I am ok   History of Present Illness:    Jill Harper is a 58 y.o. female with a hx of fibromuscular dysplasia, fibromyalgia, OSA, reported mitral valve prolapse, rheumatoid arthritis, polymyositis, erythema nodosum.  She follows with Dr. Mardeen Harper at [facility] in [place], [place] .  Per Dr. Mariella note she had a cardiac catheterization in 2002, cerebral angiogram May 2020.  Testing reviewed.  Care everywhere transcribed from Dr. Mariella note,  Today she tells me that the only issue is that she has had some intermittent palpitations.  Which comes and goes.  This been going on for years now.  No chest pain no shortness of breath.   Past Medical History:  Diagnosis Date   Abnormal liver function test    Bronchiectasis        Dyspnea    Fibromuscular dysplasia    Fibromyalgia    Heart murmur    ILD (interstitial lung disease) (HCC)    Joint pain    Menorrhagia    MIGRAINE HEADACHE    MVP (mitral valve prolapse)    Osteoarthritis    Polymyositis (HCC)    Dr Jill Harper; chronic MTX   Pulmonary fibrosis (HCC)    Rheumatoid arthritis (HCC)    Sleep apnea    Vertigo    Vitamin B 12 deficiency    Vitamin D  deficiency     Past Surgical History:  Procedure Laterality Date   ablation uterine  2010   CARDIAC CATHETERIZATION  2002   normal   DILATION AND CURETTAGE OF UTERUS  08-31-08   Dr Jill Harper   IR ANGIO INTRA EXTRACRAN SEL COM CAROTID INNOMINATE BILAT MOD SED  09/18/2018   IR ANGIO VERTEBRAL SEL VERTEBRAL BILAT MOD SED  09/18/2018   IR US  GUIDE VASC ACCESS RIGHT  09/18/2018    Current Medications: Current Meds  Medication Sig   albuterol  (PROVENTIL ) (2.5 MG/3ML) 0.083% nebulizer solution Take 3 mLs (2.5 mg total) by nebulization  every 6 (six) hours as needed for wheezing or shortness of breath.   albuterol  (VENTOLIN  HFA) 108 (90 Base) MCG/ACT inhaler INHALE 2 PUFFS BY MOUTH INTO THE LUNGS FOUR TIMES DAILY AS NEEDED   alendronate (FOSAMAX) 70 MG tablet Take 1 tablet by mouth once a week.   aspirin EC 81 MG tablet Take 81 mg by mouth daily.   atenolol  (TENORMIN ) 25 MG tablet TAKE 1 TABLET(25 MG) BY MOUTH DAILY   azaTHIOprine  (IMURAN ) 50 MG tablet TAKE 2 TABLETS BY MOUTH DAILY   Cholecalciferol (VITAMIN D ) 50 MCG (2000 UT) CAPS Take by mouth.   diclofenac  Sodium (VOLTAREN ) 1 % GEL APPLY 2 TO 4 GRAMS TOPICALLY TO AFFECTED JOINT FOUR TIMES DAILY AS NEEDED   DULoxetine  (CYMBALTA ) 30 MG capsule TAKE ONE CAPSULE BY MOUTH DAILY FOR TOAL OF 90 MG DAILY   DULoxetine  (CYMBALTA ) 60 MG capsule TAKE 1 CAPSULE(60 MG) BY MOUTH DAILY   ENBREL  MINI 50 MG/ML injection INSERT 1 MINI CARTRIDGE INTO AUTOINJECTOR AND INJECT 50 MG UNDER THE SKIN EVERY 7 DAYS   gabapentin  (NEURONTIN ) 100 MG capsule Take 3 capsules (300 mg total) by mouth at bedtime. TAKE 3 CAPSULES(300 MG) BY MOUTH AT BEDTIME   Galcanezumab -gnlm (EMGALITY ) 120 MG/ML SOAJ  ADMINISTER 1 ML UNDER THE SKIN EVERY 30 DAYS   guaiFENesin  (MUCINEX ) 600 MG 12 hr tablet Take 2 tablets (1,200 mg total) by mouth 2 (two) times daily as needed for cough or to loosen phlegm.   meclizine  (ANTIVERT ) 25 MG tablet Take 1 tablet (25 mg total) by mouth 3 (three) times daily as needed for dizziness.   methocarbamol  (ROBAXIN ) 500 MG tablet TAKE 1 TABLET(500 MG) BY MOUTH EVERY 8 HOURS AS NEEDED   Multiple Vitamins-Minerals (MULTIVITAMIN GUMMIES ADULT PO) Take 2 each by mouth daily.   Rimegepant Sulfate (NURTEC) 75 MG TBDP DISSOLVE ONE TABLET BY MOUTH DAILY AS NEEDED FOR MIGRAINES. TAKE AS CLOSE TO ONSET OF MIGRAINE AS POSSIBLE. 1 TABLET DAILY MAXIMUM.   sodium chloride  HYPERTONIC 3 % nebulizer solution Take 3 mL Twice daily via nebulizer as needed for cough/congestion   telmisartan  (MICARDIS ) 20 MG tablet  TAKE 1 TABLET(20 MG) BY MOUTH DAILY   TRELEGY ELLIPTA  100-62.5-25 MCG/ACT AEPB INHALE 1 PUFF INTO THE LUNGS DAILY     Allergies:   Sulfa antibiotics and Sulfonamide derivatives   Social History   Socioeconomic History   Marital status: Married    Spouse name: Jill Harper   Number of children: 1   Years of education: Not on file   Highest education level: 12th grade  Occupational History   Occupation: Compliant and Injury Clerk  Tobacco Use   Smoking status: Never    Passive exposure: Never   Smokeless tobacco: Never   Tobacco comments:    Married, lives with spouse. 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  Vaping Use   Vaping status: Never Used  Substance and Sexual Activity   Alcohol use: Not Currently    Comment: maybe a wine cooler once every other year   Drug use: Never   Sexual activity: Yes    Birth control/protection: Surgical  Other Topics Concern   Not on file  Social History Narrative   Exercise: trying to walk - limited by fatigue   Lives at home with husband    Right handed   Caffeine: none    Retired    Teacher, early years/pre Strain: Low Risk  (10/03/2023)   Overall Financial Resource Strain (CARDIA)    Difficulty of Paying Living Expenses: Not hard at all  Food Insecurity: Low Risk  (04/09/2024)   Received from [facility]   Hunger Vital Sign    Within the past 12 months, you worried that your food would run out before you got money to buy more: Never true    Within the past 12 months, the food you bought just didn't last and you didn't have money to get more. : Never true  Transportation Needs: No Transportation Needs (04/09/2024)   Received from [facility]    In the past 12 months, has lack of reliable transportation kept you from medical appointments, meetings, work or from getting things needed for daily living? : No  Physical Activity: Insufficiently Active (10/03/2023)    Exercise Vital Sign    Days of Exercise per Week: 3 days    Minutes of Exercise per Session: 10 Jill  Stress: No Stress Concern Present (10/03/2023)   [employer] of Occupational Health - Occupational Stress Questionnaire    Feeling of Stress : Only a little  Social Connections: Socially Integrated (10/03/2023)   Social Connection and Isolation Panel    Frequency of Communication with Friends  and Family: More than three times a week    Frequency of Social Gatherings with Friends and Family: Patient declined    Attends Religious Services: More than 4 times per year    Active Member of [employer] or Organizations: Patient declined    Attends Engineer, structural: More than 4 times per year    Marital Status: Married     Family History: The patient's family history includes Diabetes in her brother, brother, brother, mother, sister, and sister; Fibromyalgia in her mother; Headache in an other family member; Healthy in her daughter; Heart attack in her sister; Heart disease in her brother and brother; Heart failure in her mother; Hypertension in her father, sister, sister, and sister; Kidney disease in her sister; Lupus in her sister; Migraines in her sister; Multiple myeloma in her father and sister; Obesity in her mother; Osteoarthritis in her sister; Other in her sister; Rheum arthritis in her sister; Stroke in her brother and father; Thyroid  disease in her mother; Ulcers in her mother.  ROS:   Review of Systems  Constitution: Negative for decreased appetite, fever and weight gain.  HENT: Negative for congestion, ear discharge, hoarse voice and sore throat.   Eyes: Negative for discharge, redness, vision loss in right eye and visual halos.  Cardiovascular: Negative for chest pain, dyspnea on exertion, leg swelling, orthopnea and palpitations.  Respiratory: Negative for cough, hemoptysis, shortness of breath and snoring.   Endocrine: Negative for heat intolerance and polyphagia.   Hematologic/Lymphatic: Negative for bleeding problem. Does not bruise/bleed easily.  Skin: Negative for flushing, nail changes, rash and suspicious lesions.  Musculoskeletal: Negative for arthritis, joint pain, muscle cramps, myalgias, neck pain and stiffness.  Gastrointestinal: Negative for abdominal pain, bowel incontinence, diarrhea and excessive appetite.  Genitourinary: Negative for decreased libido, genital sores and incomplete emptying.  Neurological: Negative for brief paralysis, focal weakness, headaches and loss of balance.  Psychiatric/Behavioral: Negative for altered mental status, depression and suicidal ideas.  Allergic/Immunologic: Negative for HIV exposure and persistent infections.    EKGs/Labs/Other Studies Reviewed:    The following studies were reviewed today:   EKG:  The ekg ordered today demonstrates   As reviewed from Care Everywhere transcribes from Dr. Mariella note:  TTE 04/20/22 1. Left ventricular ejection fraction, by estimation, is 55 to 60%. The left ventricle has normal function. The left ventricle has no regional wall motion abnormalities. There is mild concentric left ventricular hypertrophy. Left ventricular diastolic  parameters were normal.  2. Right ventricular systolic function is normal. The right ventricular size is normal. Tricuspid regurgitation signal is inadequate for assessing PA pressure.  3. The mitral valve is grossly normal. Trivial mitral valve regurgitation. No evidence of mitral stenosis.  4. The aortic valve is tricuspid. Aortic valve regurgitation is not visualized. No aortic stenosis is present.  5. The inferior vena cava is normal in size with greater than 50% respiratory variability, suggesting right atrial pressure of 3 mmHg.   CTA head/neck 04/15/22 IMPRESSION:  No change since the prior study. Findings of fibromuscular disease  of the cervical internal carotid arteries. No evidence of acute  dissection, flow limiting stenosis  or pseudo aneurysm   CTA c/a/p 03/27/22 No evidence of fibromuscular dysplasia within the chest, abdomen or pelvis.   Outside Paper Medical Record Review personally performed  Cerebral angiogram 09/18/2018 Moderate FMD changes involving the mid cervical segment of the left  internal carotid artery with no significant stenosis.   Moderate to severe fibromuscular dysplastic change involving  the  right internal carotid artery distal to the bulb associated with  suggestion of a high-grade stenosis on the AP projection at the  proximal aspect of the FMD-like changes.   Transient retrograde opacification of the right internal carotid  artery supraclinoid segment via the right posterior communicating  artery.   CTA head/neck 09/04/2018 1. Patent carotid and vertebral arteries. No dissection, aneurysm,  or hemodynamically significant stenosis utilizing NASCET criteria.  2. Patent anterior and posterior intracranial circulation. No large  vessel occlusion, aneurysm, or significant stenosis.  3. Non stenotic beaded irregularity of mid and upper cervical  internal carotid arteries compatible with fibromuscular dysplasia.  4. Negative noncontrast CT of the head. No abnormal enhancement  after contrast administration.   CCTA 07/18/2018 IMPRESSION:  1. Coronary calcium score of 0. This was 0 percentile for age and  sex matched control.   2. Normal coronary origin with left dominance.   3. This study quality is affected by patient's size, however there  is no evidence of CAD.    Recent Labs: 09/09/2023: TSH 3.83 03/12/2024: ALT 12; BUN 11; Creat 0.68; Hemoglobin 12.5; Platelets 267; Potassium 5.0; Sodium 140  Recent Lipid Panel    Component Value Date/Time   Harper 120 09/09/2023 0950   Harper 154 10/12/2019 1432   TRIG 88.0 09/09/2023 0950   HDL 39.20 09/09/2023 0950   HDL 61 10/12/2019 1432   CHOLHDL 3 09/09/2023 0950   VLDL 17.6 09/09/2023 0950   LDLCALC 63 09/09/2023 0950    LDLCALC 83 10/12/2019 1432   Physical Exam:    VS:  BP 118/80 (BP Location: Left Arm, Patient Position: Sitting, Cuff Size: Large)   Pulse 72   Ht 5' 8 (1.727 m)   Wt (!) 304 lb 3.2 oz (138 kg)   SpO2 94%   BMI 46.25 kg/m     Wt Readings from Last 3 Encounters:  05/26/24 (!) 304 lb 3.2 oz (138 kg)  04/08/24 (!) 309 lb (140.2 kg)  01/27/24 (!) 310 lb (140.6 kg)     GEN: Well nourished, well developed in no acute distress HEENT: Normal NECK: No JVD; No carotid bruits LYMPHATICS: No lymphadenopathy CARDIAC: S1S2 noted,RRR, no murmurs, rubs, gallops RESPIRATORY:  Clear to auscultation without rales, wheezing or rhonchi  ABDOMEN: Soft, non-tender, non-distended, +bowel sounds, no guarding. EXTREMITIES: No edema, No cyanosis, no clubbing MUSCULOSKELETAL:  No deformity  SKIN: Warm and dry NEUROLOGIC:  Alert and oriented x 3, non-focal PSYCHIATRIC:  Normal affect, good insight  ASSESSMENT:    1. Hypertension, unspecified type   2. Fibromuscular dysplasia   3. Cardiac murmur   4. Palpitations   5. LVH (left ventricular hypertrophy), mild   6. Mixed hyperlipidemia   7. Morbid obesity with BMI of 45.0-49.9, adult (HCC)    PLAN:    Fibromuscular dysplasia with hypertension -  Arterial fibromuscular dysplasia with essential hypertension. Blood pressure well-controlled with atenolol  and telmisartan . - Continue atenolol  and telmisartan  for blood pressure control.  Obstructive sleep apnea - Obstructive sleep apnea managed with CPAP therapy. Pulmonologist adjusted CPAP settings for oxygen  desaturation. - Continue CPAP therapy with oxygen  supplementation.  Prediabetes - Prediabetes managed with dietary modifications.  Blood work scheduled to monitor glucose levels.   Evaluation of mitral valve disease and cardiac murmur -  Mitral valve prolapse not confirmed on recent echocardiogram. Symptoms include exertional fatigue, holosystolic murmur detected. Atenolol  may alleviate  symptoms. - Order echocardiogram to evaluate mitral valve status.  She has experience of palpitations a will  be beneficial to place a monitor on the patient and make sure she is not experiencing any breakthrough arrhythmias.  The patient understands the need to lose weight with diet and exercise. We have discussed specific strategies for this.  The patient is in agreement with the above plan. The patient left the office in stable condition.  The patient will follow up in   Medication Adjustments/Labs and Tests Ordered: Current medicines are reviewed at length with the patient today.  Concerns regarding medicines are outlined above.  Orders Placed This Encounter  Procedures   LONG TERM MONITOR (3-14 DAYS)   EKG 12-Lead   ECHOCARDIOGRAM COMPLETE   No orders of the defined types were placed in this encounter.   Patient Instructions  Medication Instructions:  Your physician recommends that you continue on your current medications as directed. Please refer to the Current Medication list given to you today.  *If you need a refill on your cardiac medications before your next appointment, please call your pharmacy*   Testing/Procedures: Your physician has requested that you have an echocardiogram. Echocardiography is a painless test that uses sound waves to create images of your heart. It provides your doctor with information about the size and shape of your heart and how well your heart's chambers and valves are working. This procedure takes approximately one hour. There are no restrictions for this procedure. Please do NOT wear cologne, perfume, aftershave, or lotions (deodorant is allowed). Please arrive 15 minutes prior to your appointment time.  Please note: We ask at that you not bring children with you during ultrasound (echo/ vascular) testing. Due to room size and safety concerns, children are not allowed in the ultrasound rooms during exams. Our front office staff cannot provide  observation of children in our lobby area while testing is being conducted. An adult accompanying a patient to their appointment will only be allowed in the ultrasound room at the discretion of the ultrasound technician under special circumstances. We apologize for any inconvenience.  ZIO XT- Long Term Monitor Instructions  Your physician has requested you wear a ZIO patch monitor for 14 days.  This is a single patch monitor. [employer] supplies one patch monitor per enrollment. Additional stickers are not available. Please do not apply patch if you will be having a Nuclear Stress Test,  Echocardiogram, Cardiac CT, MRI, or Chest Xray during the period you would be wearing the  monitor. The patch cannot be worn during these tests. You cannot remove and re-apply the  ZIO XT patch monitor.  Your ZIO patch monitor will be mailed 3 day USPS to your address on file. It may take 3-5 days  to receive your monitor after you have been enrolled.  Once you have received your monitor, please review the enclosed instructions. Your monitor  has already been registered assigning a specific monitor serial # to you.  Billing and Patient Assistance Program Information  We have supplied [employer] with any of your insurance information on file for billing purposes. [employer] offers a sliding scale Patient Assistance Program for patients that do not have  insurance, or whose insurance does not completely cover the cost of the ZIO monitor.  You must apply for the Patient Assistance Program to qualify for this discounted rate.  To apply, please call [employer] at [phone], select option 4, select option 2, ask to apply for  Patient Assistance Program. Meredeth will ask your household income, and how many people  are in your household. They will quote your out-of-pocket  cost based on that information.  [employer] will also be able to set up a 80-month, interest-free payment plan if needed.  Applying the monitor   Shave  hair from upper left chest.  Hold abrader disc by orange tab. Rub abrader in 40 strokes over the upper left chest as  indicated in your monitor instructions.  Clean area with 4 enclosed alcohol pads. Let dry.  Apply patch as indicated in monitor instructions. Patch will be placed under collarbone on left  side of chest with arrow pointing upward.  Rub patch adhesive wings for 2 minutes. Remove white label marked 1. Remove the white  label marked 2. Rub patch adhesive wings for 2 additional minutes.  While looking in a mirror, press and release button in center of patch. A small green light will  flash 3-4 times. This will be your only indicator that the monitor has been turned on.  Do not shower for the first 24 hours. You may shower after the first 24 hours.  Press the button if you feel a symptom. You will hear a small click. Record Date, Time and  Symptom in the Patient Logbook.  When you are ready to remove the patch, follow instructions on the last 2 pages of Patient  Logbook. Stick patch monitor onto the last page of Patient Logbook.  Place Patient Logbook in the blue and white box. Use locking tab on box and tape box closed  securely. The blue and white box has prepaid postage on it. Please place it in the mailbox as  soon as possible. Your physician should have your test results approximately 7 days after the  monitor has been mailed back to [facility].  Call [facility] Customer Care at [phone] if you have questions regarding  your ZIO XT patch monitor. Call them immediately if you see an orange light blinking on your  monitor.  If your monitor falls off in less than 4 days, contact our Monitor department at [phone].  If your monitor becomes loose or falls off after 4 days call [employer] at [phone] for  suggestions on securing your monitor   Follow-Up: At [facility], you and your health needs are our priority.  As part of our continuing  mission to provide you with exceptional heart care, our providers are all part of one team.  This team includes your primary Cardiologist (physician) and Advanced Practice Providers or APPs (Physician Assistants and Nurse Practitioners) who all work together to provide you with the care you need, when you need it.  Your next appointment:   16 week(s) via MyChart  Provider:   Myeisha Kruser, DO          Adopting a Healthy Lifestyle.  Know what a healthy weight is for you (roughly BMI <25) and aim to maintain this   Aim for 7+ servings of fruits and vegetables daily   65-80+ fluid ounces of water or unsweet tea for healthy kidneys   Limit to max 1 drink of alcohol per day; avoid smoking/tobacco   Limit animal fats in diet for cholesterol and heart health - choose grass fed whenever available   Avoid highly processed foods, and foods high in saturated/trans fats   Aim for low stress - take time to unwind and care for your mental health   Aim for 150 Jill of moderate intensity exercise weekly for heart health, and weights twice weekly for bone health   Aim for 7-9 hours of sleep daily   When  it comes to diets, agreement about the perfect plan isnt easy to find, even among the experts. Experts at the [facility] [employer] developed an idea known as the Healthy Eating Plate. Just imagine a plate divided into logical, healthy portions.   The emphasis is on diet quality:   Load up on vegetables and fruits - one-half of your plate: Aim for color and variety, and remember that potatoes dont count.   Go for whole grains - one-quarter of your plate: Whole wheat, barley, wheat berries, quinoa, oats, Molloy rice, and foods made with them. If you want pasta, go with whole wheat pasta.   Protein power - one-quarter of your plate: Fish, chicken, beans, and nuts are all healthy, versatile protein sources. Limit red meat.   The diet, however, does go beyond the plate, offering a few  other suggestions.   Use healthy plant oils, such as olive, canola, soy, corn, sunflower and peanut. Check the labels, and avoid partially hydrogenated oil, which have unhealthy trans fats.   If youre thirsty, drink water. Coffee and tea are good in moderation, but skip sugary drinks and limit milk and dairy products to one or two daily servings.   The type of carbohydrate in the diet is more important than the amount. Some sources of carbohydrates, such as vegetables, fruits, whole grains, and beans-are healthier than others.   Finally, stay active  Signed, Jill Huntsman, DO  05/26/2024 4:19 PM    [facility]

## 2024-05-26 NOTE — Patient Instructions (Signed)
 Medication Instructions:  Your physician recommends that you continue on your current medications as directed. Please refer to the Current Medication list given to you today.  *If you need a refill on your cardiac medications before your next appointment, please call your pharmacy*   Testing/Procedures: Your physician has requested that you have an echocardiogram. Echocardiography is a painless test that uses sound waves to create images of your heart. It provides your doctor with information about the size and shape of your heart and how well your heart's chambers and valves are working. This procedure takes approximately one hour. There are no restrictions for this procedure. Please do NOT wear cologne, perfume, aftershave, or lotions (deodorant is allowed). Please arrive 15 minutes prior to your appointment time.  Please note: We ask at that you not bring children with you during ultrasound (echo/ vascular) testing. Due to room size and safety concerns, children are not allowed in the ultrasound rooms during exams. Our front office staff cannot provide observation of children in our lobby area while testing is being conducted. An adult accompanying a patient to their appointment will only be allowed in the ultrasound room at the discretion of the ultrasound technician under special circumstances. We apologize for any inconvenience.  ZIO XT- Long Term Monitor Instructions  Your physician has requested you wear a ZIO patch monitor for 14 days.  This is a single patch monitor. [employer] supplies one patch monitor per enrollment. Additional stickers are not available. Please do not apply patch if you will be having a Nuclear Stress Test,  Echocardiogram, Cardiac CT, MRI, or Chest Xray during the period you would be wearing the  monitor. The patch cannot be worn during these tests. You cannot remove and re-apply the  ZIO XT patch monitor.  Your ZIO patch monitor will be mailed 3 day USPS to your  address on file. It may take 3-5 days  to receive your monitor after you have been enrolled.  Once you have received your monitor, please review the enclosed instructions. Your monitor  has already been registered assigning a specific monitor serial # to you.  Billing and Patient Assistance Program Information  We have supplied [employer] with any of your insurance information on file for billing purposes. [employer] offers a sliding scale Patient Assistance Program for patients that do not have  insurance, or whose insurance does not completely cover the cost of the ZIO monitor.  You must apply for the Patient Assistance Program to qualify for this discounted rate.  To apply, please call [employer] at [phone], select option 4, select option 2, ask to apply for  Patient Assistance Program. Meredeth will ask your household income, and how many people  are in your household. They will quote your out-of-pocket cost based on that information.  [employer] will also be able to set up a 5-month, interest-free payment plan if needed.  Applying the monitor   Shave hair from upper left chest.  Hold abrader disc by orange tab. Rub abrader in 40 strokes over the upper left chest as  indicated in your monitor instructions.  Clean area with 4 enclosed alcohol pads. Let dry.  Apply patch as indicated in monitor instructions. Patch will be placed under collarbone on left  side of chest with arrow pointing upward.  Rub patch adhesive wings for 2 minutes. Remove white label marked 1. Remove the white  label marked 2. Rub patch adhesive wings for 2 additional minutes.  While looking in a mirror, press and release  button in center of patch. A small green light will  flash 3-4 times. This will be your only indicator that the monitor has been turned on.  Do not shower for the first 24 hours. You may shower after the first 24 hours.  Press the button if you feel a symptom. You will hear a small click. Record  Date, Time and  Symptom in the Patient Logbook.  When you are ready to remove the patch, follow instructions on the last 2 pages of Patient  Logbook. Stick patch monitor onto the last page of Patient Logbook.  Place Patient Logbook in the blue and white box. Use locking tab on box and tape box closed  securely. The blue and white box has prepaid postage on it. Please place it in the mailbox as  soon as possible. Your physician should have your test results approximately 7 days after the  monitor has been mailed back to [facility]  Call [facility] Customer Care at [phone] if you have questions regarding  your ZIO XT patch monitor. Call them immediately if you see an orange light blinking on your  monitor.  If your monitor falls off in less than 4 days, contact our Monitor department at [phone].  If your monitor becomes loose or falls off after 4 days call [employer] at [phone] for  suggestions on securing your monitor   Follow-Up: At [facility], you and your health needs are our priority.  As part of our continuing mission to provide you with exceptional heart care, our providers are all part of one team.  This team includes your primary Cardiologist (physician) and Advanced Practice Providers or APPs (Physician Assistants and Nurse Practitioners) who all work together to provide you with the care you need, when you need it.  Your next appointment:   16 week(s) via MyChart  Provider:   Kardie Tobb, DO

## 2024-05-26 NOTE — Progress Notes (Unsigned)
 Enrolled for [employer] to mail a ZIO XT long term holter monitor to the patients address on file.

## 2024-05-28 ENCOUNTER — Encounter: Payer: Self-pay | Admitting: Internal Medicine

## 2024-05-28 ENCOUNTER — Other Ambulatory Visit: Payer: Self-pay | Admitting: Internal Medicine

## 2024-05-28 NOTE — Patient Instructions (Addendum)

## 2024-05-28 NOTE — Progress Notes (Unsigned)
 Subjective:    Patient ID: Jill Harper, female    DOB: [birth year], 58 y.o.   MRN: [id number]      HPI Jill Harper is here for a Physical exam and her chronic medical problems.    Overall doing well.  He has no major concerns.  There has been a little increased stress because her mother-in-law has dementia and her and her husband are helping to care for her.  Recently she has not been exercising as regularly, but was doing well prior to this.   Medications and allergies reviewed with patient and updated if appropriate.  Current Outpatient Medications on File Prior to Visit  Medication Sig Dispense Refill   albuterol  (PROVENTIL ) (2.5 MG/3ML) 0.083% nebulizer solution Take 3 mLs (2.5 mg total) by nebulization every 6 (six) hours as needed for wheezing or shortness of breath. 120 vial 5   albuterol  (VENTOLIN  HFA) 108 (90 Base) MCG/ACT inhaler INHALE 2 PUFFS BY MOUTH INTO THE LUNGS FOUR TIMES DAILY AS NEEDED 8.5 g 1   alendronate (FOSAMAX) 70 MG tablet Take 1 tablet by mouth once a week.     aspirin EC 81 MG tablet Take 81 mg by mouth daily.     atenolol  (TENORMIN ) 25 MG tablet TAKE 1 TABLET(25 MG) BY MOUTH DAILY 90 tablet 2   azaTHIOprine  (IMURAN ) 50 MG tablet TAKE 2 TABLETS BY MOUTH DAILY 180 tablet 0   Cholecalciferol (VITAMIN D ) 50 MCG (2000 UT) CAPS Take by mouth.     diclofenac  Sodium (VOLTAREN ) 1 % GEL APPLY 2 TO 4 GRAMS TOPICALLY TO AFFECTED JOINT FOUR TIMES DAILY AS NEEDED 400 g 2   DULoxetine  (CYMBALTA ) 30 MG capsule TAKE ONE CAPSULE BY MOUTH DAILY FOR TOAL OF 90 MG DAILY 30 capsule 3   DULoxetine  (CYMBALTA ) 60 MG capsule TAKE 1 CAPSULE(60 MG) BY MOUTH DAILY 90 capsule 1   ENBREL  MINI 50 MG/ML injection INSERT 1 MINI CARTRIDGE INTO AUTOINJECTOR AND INJECT 50 MG UNDER THE SKIN EVERY 7 DAYS 12 mL 0   gabapentin  (NEURONTIN ) 100 MG capsule Take 3 capsules (300 mg total) by mouth at bedtime. TAKE 3 CAPSULES(300 MG) BY MOUTH AT BEDTIME 270 capsule 3   Galcanezumab -gnlm (EMGALITY ) 120  MG/ML SOAJ ADMINISTER 1 ML UNDER THE SKIN EVERY 30 DAYS 3 mL 4   guaiFENesin  (MUCINEX ) 600 MG 12 hr tablet Take 2 tablets (1,200 mg total) by mouth 2 (two) times daily as needed for cough or to loosen phlegm.     meclizine  (ANTIVERT ) 25 MG tablet Take 1 tablet (25 mg total) by mouth 3 (three) times daily as needed for dizziness. 30 tablet 0   methocarbamol  (ROBAXIN ) 500 MG tablet TAKE 1 TABLET(500 MG) BY MOUTH EVERY 8 HOURS AS NEEDED 60 tablet 3   Multiple Vitamins-Minerals (MULTIVITAMIN GUMMIES ADULT PO) Take 2 each by mouth daily.     Rimegepant Sulfate (NURTEC) 75 MG TBDP DISSOLVE ONE TABLET BY MOUTH DAILY AS NEEDED FOR MIGRAINES. TAKE AS CLOSE TO ONSET OF MIGRAINE AS POSSIBLE. 1 TABLET DAILY MAXIMUM. 15 tablet 11   sodium chloride  HYPERTONIC 3 % nebulizer solution Take 3 mL Twice daily via nebulizer as needed for cough/congestion 180 mL 11   telmisartan  (MICARDIS ) 20 MG tablet TAKE 1 TABLET(20 MG) BY MOUTH DAILY 90 tablet 1   TRELEGY ELLIPTA  100-62.5-25 MCG/ACT AEPB INHALE 1 PUFF INTO THE LUNGS DAILY 60 each 5   No current facility-administered medications on file prior to visit.    Review of Systems  Constitutional:  Negative for fever.  Eyes:  Negative for visual disturbance.  Respiratory:  Positive for cough, shortness of breath and wheezing.   Cardiovascular:  Positive for chest pain and palpitations. Negative for leg swelling.  Gastrointestinal:  Negative for abdominal pain, blood in stool, constipation and diarrhea.       No gerd  Genitourinary:  Negative for dysuria.  Musculoskeletal:  Positive for arthralgias (mild). Negative for back pain.  Skin:  Negative for rash.  Neurological:  Positive for light-headedness and headaches.  Psychiatric/Behavioral:  Positive for dysphoric mood. The patient is nervous/anxious.        Objective:   Vitals:   05/29/24 1004  BP: 122/74  Pulse: 63  Temp: 98.7 F (37.1 C)  SpO2: 95%   Filed Weights   05/29/24 1004  Weight: (!) 301 lb  (136.5 kg)   Body mass index is 45.77 kg/m.  BP Readings from Last 3 Encounters:  05/29/24 122/74  05/26/24 118/80  04/16/24 127/68    Wt Readings from Last 3 Encounters:  05/29/24 (!) 301 lb (136.5 kg)  05/26/24 (!) 304 lb 3.2 oz (138 kg)  04/08/24 (!) 309 lb (140.2 kg)       Physical Exam Constitutional: She appears well-developed and well-nourished. No distress.  HENT:  Head: Normocephalic and atraumatic.  Right Ear: External ear normal. Normal ear canal and TM Left Ear: External ear normal.  Normal ear canal and TM Mouth/Throat: Oropharynx is clear and moist.  Eyes: Conjunctivae normal.  Neck: Neck supple. No tracheal deviation present. No thyromegaly present.  No carotid bruit  Cardiovascular: Normal rate, regular rhythm and normal heart sounds.   No murmur heard.  No edema. Pulmonary/Chest: Effort normal and breath sounds normal. No respiratory distress. She has no wheezes. She has no rales.  Breast: deferred   Abdominal: Soft. She exhibits no distension. There is diffuse mild tenderness which is chronic.  Lymphadenopathy: She has no cervical adenopathy.  Skin: Skin is warm and dry. She is not diaphoretic.  Psychiatric: She has a normal mood and affect. Her behavior is normal.     Lab Results  Component Value Date   WBC 7.8 03/12/2024   HGB 12.5 03/12/2024   HCT 39.1 03/12/2024   PLT 267 03/12/2024   GLUCOSE 118 (H) 03/12/2024   CHOL 120 09/09/2023   TRIG 88.0 09/09/2023   HDL 39.20 09/09/2023   LDLCALC 63 09/09/2023   ALT 12 03/12/2024   AST 18 03/12/2024   NA 140 03/12/2024   K 5.0 03/12/2024   CL 105 03/12/2024   CREATININE 0.68 03/12/2024   BUN 11 03/12/2024   CO2 28 03/12/2024   TSH 3.83 09/09/2023   INR 1.03 09/18/2018   HGBA1C 6.2 09/09/2023         Assessment & Plan:   Physical exam: Screening blood work  ordered Exercise  was doing 4/week - family issues came up and threw her schedule off -- trying to do something regularly  -  moving more Weight  obese - working on weight loss Substance abuse  none   Reviewed recommended immunizations.   Health Maintenance  Topic Date Due   Medicare Annual Wellness (AWV)  Never done   DEXA SCAN  02/22/2018   Pneumococcal Vaccine: 50+ Years (4 of 4 - PCV20 or PCV21) 06/17/2023   Mammogram  12/21/2023   Influenza Vaccine  03/06/2024   COVID-19 Vaccine (6 - 2025-26 season) 06/13/2024 (Originally 04/06/2024)   Hepatitis B Vaccines 19-59 Average Risk (1 of 3 -  19+ 3-dose series) 05/29/2025 (Originally [birth year]1984)   Cervical Cancer Screening (HPV/Pap Cotest)  12/25/2024   Colonoscopy  04/20/2026   DTaP/Tdap/Td (3 - Td or Tdap) 01/05/2030   Hepatitis C Screening  Completed   HIV Screening  Completed   HPV VACCINES  Aged Out   Meningococcal B Vaccine  Aged Out   Zoster Vaccines- Shingrix  Discontinued          See Problem List for Assessment and Plan of chronic medical problems.

## 2024-05-29 ENCOUNTER — Ambulatory Visit (INDEPENDENT_AMBULATORY_CARE_PROVIDER_SITE_OTHER): Admitting: Internal Medicine

## 2024-05-29 VITALS — BP 122/74 | HR 63 | Temp 98.7°F | Ht 68.0 in | Wt 301.0 lb

## 2024-05-29 DIAGNOSIS — I341 Nonrheumatic mitral (valve) prolapse: Secondary | ICD-10-CM

## 2024-05-29 DIAGNOSIS — R7303 Prediabetes: Secondary | ICD-10-CM | POA: Diagnosis not present

## 2024-05-29 DIAGNOSIS — E559 Vitamin D deficiency, unspecified: Secondary | ICD-10-CM | POA: Diagnosis not present

## 2024-05-29 DIAGNOSIS — E038 Other specified hypothyroidism: Secondary | ICD-10-CM

## 2024-05-29 DIAGNOSIS — F419 Anxiety disorder, unspecified: Secondary | ICD-10-CM

## 2024-05-29 DIAGNOSIS — M069 Rheumatoid arthritis, unspecified: Secondary | ICD-10-CM

## 2024-05-29 DIAGNOSIS — M797 Fibromyalgia: Secondary | ICD-10-CM

## 2024-05-29 DIAGNOSIS — Z23 Encounter for immunization: Secondary | ICD-10-CM | POA: Diagnosis not present

## 2024-05-29 DIAGNOSIS — I10 Essential (primary) hypertension: Secondary | ICD-10-CM | POA: Diagnosis not present

## 2024-05-29 DIAGNOSIS — I773 Arterial fibromuscular dysplasia: Secondary | ICD-10-CM

## 2024-05-29 DIAGNOSIS — Z0000 Encounter for general adult medical examination without abnormal findings: Secondary | ICD-10-CM | POA: Diagnosis not present

## 2024-05-29 DIAGNOSIS — I340 Nonrheumatic mitral (valve) insufficiency: Secondary | ICD-10-CM

## 2024-05-29 DIAGNOSIS — R002 Palpitations: Secondary | ICD-10-CM

## 2024-05-29 LAB — CBC WITH DIFFERENTIAL/PLATELET
Basophils Absolute: 0 K/uL (ref 0.0–0.1)
Basophils Relative: 0.3 % (ref 0.0–3.0)
Eosinophils Absolute: 0.2 K/uL (ref 0.0–0.7)
Eosinophils Relative: 2.3 % (ref 0.0–5.0)
HCT: 39 % (ref 36.0–46.0)
Hemoglobin: 13 g/dL (ref 12.0–15.0)
Lymphocytes Relative: 19.3 % (ref 12.0–46.0)
Lymphs Abs: 1.6 K/uL (ref 0.7–4.0)
MCHC: 33.2 g/dL (ref 30.0–36.0)
MCV: 85 fl (ref 78.0–100.0)
Monocytes Absolute: 0.7 K/uL (ref 0.1–1.0)
Monocytes Relative: 7.9 % (ref 3.0–12.0)
Neutro Abs: 5.9 K/uL (ref 1.4–7.7)
Neutrophils Relative %: 70.2 % (ref 43.0–77.0)
Platelets: 267 K/uL (ref 150.0–400.0)
RBC: 4.59 Mil/uL (ref 3.87–5.11)
RDW: 13.3 % (ref 11.5–15.5)
WBC: 8.4 K/uL (ref 4.0–10.5)

## 2024-05-29 LAB — COMPREHENSIVE METABOLIC PANEL WITH GFR
ALT: 14 U/L (ref 0–35)
AST: 17 U/L (ref 0–37)
Albumin: 4.2 g/dL (ref 3.5–5.2)
Alkaline Phosphatase: 53 U/L (ref 39–117)
BUN: 12 mg/dL (ref 6–23)
CO2: 28 meq/L (ref 19–32)
Calcium: 9.5 mg/dL (ref 8.4–10.5)
Chloride: 105 meq/L (ref 96–112)
Creatinine, Ser: 0.63 mg/dL (ref 0.40–1.20)
GFR: 97.69 mL/min (ref >60.00)
Glucose, Bld: 108 mg/dL — ABNORMAL HIGH (ref 70–99)
Potassium: 4.1 meq/L (ref 3.5–5.1)
Sodium: 140 meq/L (ref 135–145)
Total Bilirubin: 0.6 mg/dL (ref 0.2–1.2)
Total Protein: 7.8 g/dL (ref 6.0–8.3)

## 2024-05-29 LAB — LIPID PANEL
Cholesterol: 136 mg/dL (ref 0–200)
HDL: 38.3 mg/dL — ABNORMAL LOW (ref >39.00)
LDL Cholesterol: 76 mg/dL (ref 0–99)
NonHDL: 98.14
Total CHOL/HDL Ratio: 4
Triglycerides: 111 mg/dL (ref 0.0–149.0)
VLDL: 22.2 mg/dL (ref 0.0–40.0)

## 2024-05-29 LAB — VITAMIN D 25 HYDROXY (VIT D DEFICIENCY, FRACTURES): VITD: 29.39 ng/mL — ABNORMAL LOW (ref 30.00–100.00)

## 2024-05-29 LAB — HEMOGLOBIN A1C: Hgb A1c MFr Bld: 6.1 % (ref 4.6–6.5)

## 2024-05-29 LAB — TSH: TSH: 2.96 u[IU]/mL (ref 0.35–5.50)

## 2024-05-29 MED ORDER — DULOXETINE HCL 30 MG PO CPEP: 30.0000 mg | ORAL_CAPSULE | Freq: Every day | ORAL | 3 refills | Status: DC

## 2024-05-29 MED ORDER — DULOXETINE HCL 60 MG PO CPEP: 60.0000 mg | ORAL_CAPSULE | Freq: Every day | ORAL | 1 refills | Status: AC

## 2024-05-29 MED ORDER — TELMISARTAN 20 MG PO TABS: ORAL_TABLET | ORAL | 3 refills | Status: AC

## 2024-05-29 MED ORDER — ATENOLOL 25 MG PO TABS: 25.0000 mg | ORAL_TABLET | Freq: Every day | ORAL | 2 refills | Status: AC

## 2024-05-29 NOTE — Addendum Note (Signed)
 Addended by: CLAUDENE TOBIAS PARAS on: 05/29/2024 12:56 PM   Modules accepted: Orders

## 2024-05-29 NOTE — Assessment & Plan Note (Signed)
 Chronic Has chronic pain, associated brain fog-symptoms vary in intensity Has intermittent flares Continue Cymbalta  90 mg daily, gabapentin  300 mg at bedtime Stressed regular exercise

## 2024-05-29 NOTE — Assessment & Plan Note (Signed)
 Chronic Blood pressure well-controlled CBC, CMP Continue current medications-atenolol 25 mg daily, telmisartan 20 mg daily

## 2024-05-29 NOTE — Assessment & Plan Note (Signed)
Chronic Taking vitamin D daily Check level

## 2024-05-29 NOTE — Assessment & Plan Note (Signed)
Chronic  Following with Dr Maudie Mercury in [place]

## 2024-05-29 NOTE — Assessment & Plan Note (Signed)
 Chronic Lab Results  Component Value Date   HGBA1C 6.2 09/09/2023   Check A1c Low sugar / carb diet Stressed regular exercise

## 2024-05-29 NOTE — Assessment & Plan Note (Addendum)
 Chronic Controlled Continue atenolol  to 25 mg daily Following with cardiology-echocardiogram and monitor for 2 weeks ordered to reevaluate

## 2024-05-29 NOTE — Assessment & Plan Note (Signed)
 Chronic TSH periodically elevated thyroid  antibodies normal in recent past Check TSH

## 2024-05-29 NOTE — Assessment & Plan Note (Signed)
 Chronic Echocardiogram 2023 showed trivial MR Following with cardiology

## 2024-05-29 NOTE — Assessment & Plan Note (Addendum)
 Chronic Last echocardiogram did not show mitral valve prolapse Occasional palpitations or pinching like pain Occasionally has pain Follows with cardiology-echocardiogram and monitor ordered Continue atenolol  25 mg daily-can take an extra dose if needed

## 2024-05-29 NOTE — Assessment & Plan Note (Signed)
Chronic Management per rheumatology On azathioprine, Enbrel

## 2024-05-29 NOTE — Assessment & Plan Note (Signed)
 Chronic Overall controlled Denies panic attacks Continue duloxetine  90 mg daily Speaking with a therapist once a week

## 2024-05-31 ENCOUNTER — Ambulatory Visit: Payer: Self-pay | Admitting: Internal Medicine

## 2024-06-04 ENCOUNTER — Other Ambulatory Visit: Payer: Self-pay | Admitting: Adult Health

## 2024-06-25 NOTE — Progress Notes (Unsigned)
 Office Visit Note  Patient: Jill Harper             Date of Birth: 09/27/1965           MRN: [id number]             PCP: Geofm Glade PARAS, MD Referring: Geofm Glade PARAS, MD Visit Date: 07/09/2024 Occupation: Data Unavailable  Subjective:  Hand pain and stiffness   History of Present Illness: Jill Harper is a 58 y.o. female with history of seropositive rheumatoid arthritis.  Patient remains on Enbrel  50 mg sq injections once weekly (02/18/20-1st injection) and Imuran  100 mg by mouth daily.  She is tolerating combination therapy without any side effects.  She has not any recent gaps in therapy.  Patient reports that she has noticed an increase stiffness pursing in the mornings as well soreness in both hands which she attributes to colder weather temperatures.  She has been using Voltaren  gel topically as needed for pain relief.  Patient states that she has also noticed some increased fatigue. Patient continues to have chronic sicca symptoms.  She has been using lotions for dry mouth as well as an oral rinse. Patient states that she recently had lab work performed on 05/29/2024 by her PCP.  Patient reports that her vitamin D  was borderline low.  Patient realized that she was taking vitamin D  5000 units 3 days a week instead of daily but has since increased the dose to daily dosing. She denies any recent or recurrent infections.   Activities of Daily Living:  Patient reports morning stiffness for 5 minutes.   Patient Reports nocturnal pain.  Difficulty dressing/grooming: Reports Difficulty climbing stairs: Reports Difficulty getting out of chair: Denies Difficulty using hands for taps, buttons, cutlery, and/or writing: Reports  Review of Systems  Constitutional:  Positive for fatigue.  HENT:  Positive for mouth dryness. Negative for mouth sores.   Eyes:  Positive for dryness.  Respiratory:  Positive for shortness of breath.   Cardiovascular:  Positive for chest pain and palpitations.   Gastrointestinal:  Negative for blood in stool, constipation and diarrhea.  Endocrine: Negative for increased urination.  Genitourinary:  Negative for involuntary urination.  Musculoskeletal:  Positive for joint pain, gait problem, joint pain, joint swelling, myalgias, muscle weakness, morning stiffness, muscle tenderness and myalgias.  Skin:  Positive for color change and sensitivity to sunlight. Negative for rash and hair loss.  Allergic/Immunologic: Positive for susceptible to infections.  Neurological:  Positive for dizziness and headaches.  Hematological:  Negative for swollen glands.  Psychiatric/Behavioral:  Positive for sleep disturbance. Negative for depressed mood. The patient is nervous/anxious.     PMFS History:  Patient Active Problem List   Diagnosis Date Noted   Chronic hypoxemic respiratory failure (HCC) 10/06/2023   Wheezing 10/04/2023   Moderate persistent asthma without complication 08/27/2023   Subclinical hypothyroidism 05/31/2023   Osteoporosis 03/27/2023   Rib pain on left side 01/08/2023   Back pain 11/21/2022   Left arm pain 09/13/2022   Left arm swelling 09/13/2022   Right upper quadrant pain 08/15/2022   Palpitations-related to MVP 04/02/2022   Nocturnal hypoxia 01/16/2022   Fibromuscular dysplasia of wall of intracranial artery 12/20/2021   Anxiety 09/06/2021   Tingling 06/28/2021   Occipital neuralgia of left side 01/06/2020   Metabolic syndrome X 07/28/2019   Entrapment of left ulnar nerve 07/27/2019   Right hand pain 07/27/2019   Bilateral carpal tunnel syndrome 07/21/2019   Costochondritis 03/16/2019  Rib pain on right side 03/16/2019   Prediabetes 11/27/2018   Fibromuscular dysplasia 10/17/2018   Hypertension 10/17/2018   Bronchiectasis with (acute) exacerbation (HCC) 10/08/2018   OSA (obstructive sleep apnea) 08/20/2018   Rheumatoid arthritis (HCC) - Dr Dolphus 04/17/2018   ILD (interstitial lung disease) (HCC) 04/17/2018   Dyspnea  04/01/2018   Bronchiectasis (HCC) 09/02/2017   LVH (left ventricular hypertrophy), mild 06/02/2017   Mitral regurgitation 05/22/2017   Jo-1 antibody positive 01/29/2017   Primary osteoarthritis of both feet 01/29/2017   Primary osteoarthritis of both hands 01/29/2017   Left shoulder tendonitis 01/17/2017   Vitamin D  deficiency 04/26/2016   Morbid obesity with BMI of 45.0-49.9, adult (HCC) 03/26/2016   Cough 07/18/2015   MVP (mitral valve prolapse)    Erythema nodosum    INCONTINENCE, URGE 05/26/2010   Migraine headache 02/15/2010   Fibromyalgia 06/20/2009   Vertigo 03/05/2008   Polymyositis (HCC) 02/11/2008   BRONCHIECTASIS 02/10/2008    Past Medical History:  Diagnosis Date   Abnormal liver function test    Anxiety July 29, 2022   Bronchiectasis        Depression 07/29/01   Following father's death. Wellbutrin for short period of time   Dyspnea    Fibromuscular dysplasia    Fibromyalgia    Heart murmur    Hypertension 07-30-2019   FMD related   ILD (interstitial lung disease) (HCC)    Joint pain    Menorrhagia    MIGRAINE HEADACHE    MVP (mitral valve prolapse)    Osteoarthritis    Oxygen  deficiency 07-30-19   Polymyositis (HCC)    Dr Elsie Chol; chronic MTX   Pulmonary fibrosis (HCC)    Rheumatoid arthritis (HCC)    Sleep apnea    Vertigo    Vitamin B 12 deficiency    Vitamin D  deficiency     Family History  Problem Relation Age of Onset   Diabetes Mother    Fibromyalgia Mother    Ulcers Mother    Heart failure Mother    Thyroid  disease Mother    Obesity Mother    Hypertension Mother    Multiple myeloma Father    Hypertension Father    Stroke Father    Lupus Sister    Other Sister        abdominal adhesions resulting in bowel obstruction   Migraines Sister    Hypertension Sister    Hypertension Sister    Rheum arthritis Sister    Multiple myeloma Sister    Arthritis Sister    COPD Sister    Obesity Sister    Hypertension Sister    Osteoarthritis Sister     Arthritis Sister    Heart attack Sister    Heart disease Sister    Diabetes Sister    Hypertension Sister    Diabetes Sister    Kidney disease Sister    Hypertension Sister    Heart disease Brother        bypass surgery   Diabetes Brother    Diabetes Brother    Heart disease Brother    Diabetes Brother    Stroke Brother    Healthy Daughter    Headache Other        siblings with headaches but not diagnosed as migraines   Arthritis Maternal Grandfather    Heart disease Sister    Stroke Brother    Past Surgical History:  Procedure Laterality Date   ablation uterine  July 29, 2009   CARDIAC CATHETERIZATION  29-Jul-2001  normal   DILATION AND CURETTAGE OF UTERUS  08-31-08   Dr Truman Corona   IR ANGIO INTRA EXTRACRAN SEL COM CAROTID INNOMINATE BILAT MOD SED  09/18/2018   IR ANGIO VERTEBRAL SEL VERTEBRAL BILAT MOD SED  09/18/2018   IR US  GUIDE VASC ACCESS RIGHT  09/18/2018   Social History   Tobacco Use   Smoking status: Never    Passive exposure: Never   Smokeless tobacco: Never   Tobacco comments:    Married, lives with spouse. 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  Vaping Use   Vaping status: Never Used  Substance Use Topics   Alcohol use: Not Currently    Comment: maybe a wine cooler once every other year   Drug use: Never   Social History   Social History Narrative   Exercise: trying to walk - limited by fatigue   Lives at home with husband    Right handed   Caffeine: none    Retired      Financial Risk Analyst History  Administered Date(s) Administered   Influenza Split 04/10/2011, 06/18/2012, 05/06/2013   Influenza Whole 05/19/2008, 05/15/2009, 06/01/2010   Influenza, Seasonal, Injecte, Preservative Fre 05/31/2023, 05/29/2024   Influenza,inj,Quad PF,6+ Mos 04/20/2014, 05/26/2015, 05/31/2016, 04/05/2017, 06/16/2018, 04/22/2019, 04/22/2020   Influenza-Unspecified 04/11/2021, 04/02/2022   PFIZER(Purple Top)SARS-COV-2 Vaccination 10/27/2019,  11/19/2019   Pneumococcal Conjugate-13 01/29/2017   Pneumococcal Polysaccharide-23 08/07/2007, 06/16/2018   Td 07/12/2009   Tdap 01/06/2020   Unspecified SARS-COV-2 Vaccination 10/27/2019, 11/19/2019, 04/04/2021   Zoster, Live 06/06/2022     Objective: Vital Signs: BP 128/81   Pulse 62   Temp (!) 97 F (36.1 C)   Resp 14   Ht 5' 8 (1.727 m)   Wt 299 lb 6.4 oz (135.8 kg)   BMI 45.52 kg/m    Physical Exam Vitals and nursing note reviewed.  Constitutional:      Appearance: She is well-developed.  HENT:     Head: Normocephalic and atraumatic.  Eyes:     Conjunctiva/sclera: Conjunctivae normal.  Cardiovascular:     Rate and Rhythm: Normal rate and regular rhythm.     Heart sounds: Normal heart sounds.  Pulmonary:     Effort: Pulmonary effort is normal.     Breath sounds: Normal breath sounds.  Abdominal:     General: Bowel sounds are normal.     Palpations: Abdomen is soft.  Musculoskeletal:     Cervical back: Normal range of motion.  Lymphadenopathy:     Cervical: No cervical adenopathy.  Skin:    General: Skin is warm and dry.     Capillary Refill: Capillary refill takes less than 2 seconds.  Neurological:     Mental Status: She is alert and oriented to person, place, and time.  Psychiatric:        Behavior: Behavior normal.      Musculoskeletal Exam: C-spine, thoracic spine, lumbar spine have good range of motion.  Trapezius muscle tension and tenderness bilaterally.  No midline spinal tenderness.  No SI joint tenderness.  Shoulder joints, elbow joints, wrist joints, MCPs, PIPs, DIPs have good range of motion with no synovitis.  Complete fist formation bilaterally.  Hip joints have good range of motion with no groin pain.  Knee joints have good range of motion no warmth or effusion.  Ankle joints have good range of motion no tenderness or joint swelling.    CDAI Exam: CDAI Score: -- Patient Global: --; Provider Global: -- Swollen: --; Tender: --  Joint Exam  07/09/2024   No joint exam has been documented for this visit   There is currently no information documented on the homunculus. Go to the Rheumatology activity and complete the homunculus joint exam.  Investigation: No additional findings.  Imaging: ECHOCARDIOGRAM COMPLETE Result Date: 07/01/2024    ECHOCARDIOGRAM REPORT   Patient Name:   ARCADIA GORGAS  Date of Exam: 07/01/2024 Medical Rec #:  [id number]     Height:       68.0 in Accession #:    [id number]    Weight:       301.0 lb Date of Birth:  [birth year]      BSA:          2.432 m Patient Age:    58 years      BP:           118/80 mmHg Patient Gender: F             HR:           65 bpm. Exam Location:  [address] Procedure: 2D Echo, Cardiac Doppler and Color Doppler (Both Spectral and Color            Flow Doppler were utilized during procedure). Indications:    R01.1 Murmur  History:        Patient has prior history of Echocardiogram examinations, most                 recent 04/20/2022. ILD, Signs/Symptoms:Murmur; Risk Factors:Sleep                 Apnea, Morbid obesity, Hypertension and Dyslipidemia.  Sonographer:    Elsie Bohr RDCS Referring Phys: [id number] KARDIE TOBB  Sonographer Comments: Technically challenging study due to limited acoustic windows, Technically difficult study due to poor echo windows and patient is obese. Image acquisition challenging due to patient body habitus. IMPRESSIONS  1. Left ventricular ejection fraction, by estimation, is 55 to 60%. The left ventricle has normal function. Left ventricular endocardial border not optimally defined to evaluate regional wall motion. There is mild concentric left ventricular hypertrophy. Left ventricular diastolic parameters are consistent with Grade I diastolic dysfunction (impaired relaxation).  2. Right ventricular systolic function is normal. The right ventricular size is normal. Tricuspid regurgitation signal is inadequate for assessing PA pressure.  3. The mitral valve is normal  in structure. No evidence of mitral valve regurgitation. No evidence of mitral stenosis.  4. The aortic valve is tricuspid and the leaflets do not appear to have restricted motion.     There is mild thickening of the aortic valve. Aortic valve regurgitation is not visualized. Very mild aortic valve stenosis. By planimetry the aortic valve area is 1.5 cm sq.  5. The inferior vena cava is normal in size with greater than 50% respiratory variability, suggesting right atrial pressure of 3 mmHg.  6. Coronary sinus flow into the right atrium appears remarkably brisk, although the coronary sinus does not appear to be dilated.  7. Left atrial size was mild to moderately dilated. Comparison(s): Prior images reviewed side by side. The aortic valve gradients are slightly higher. FINDINGS  Left Ventricle: Left ventricular ejection fraction, by estimation, is 55 to 60%. The left ventricle has normal function. Left ventricular endocardial border not optimally defined to evaluate regional wall motion. The left ventricular internal cavity size was normal in size. There is mild concentric left ventricular hypertrophy. Left ventricular diastolic parameters are consistent with Grade I diastolic dysfunction (impaired relaxation).  Right Ventricle: The right ventricular size is normal. Right vetricular wall thickness was not well visualized. Right ventricular systolic function is normal. Tricuspid regurgitation signal is inadequate for assessing PA pressure. Left Atrium: Left atrial size was mild to moderately dilated. Right Atrium: Coronary sinus flow into the right atrium appears remarkably brisk, although the coronary sinus does not appear to be dilated. Right atrial size was normal in size. Pericardium: There is no evidence of pericardial effusion. Mitral Valve: The mitral valve is normal in structure. Mild mitral annular calcification. No evidence of mitral valve regurgitation. No evidence of mitral valve stenosis. Tricuspid Valve:  The tricuspid valve is normal in structure. Tricuspid valve regurgitation is trivial. Aortic Valve: The aortic valve is tricuspid. There is mild thickening of the aortic valve. Aortic valve regurgitation is not visualized. Mild aortic stenosis is present. Aortic valve mean gradient measures 11.0 mmHg. Aortic valve peak gradient measures 21.0 mmHg. Aortic valve area, by VTI measures 1.40 cm. Pulmonic Valve: The pulmonic valve was grossly normal. Pulmonic valve regurgitation is not visualized. No evidence of pulmonic stenosis. Aorta: The aortic root and ascending aorta are structurally normal, with no evidence of dilitation. Venous: The inferior vena cava is normal in size with greater than 50% respiratory variability, suggesting right atrial pressure of 3 mmHg. IAS/Shunts: No atrial level shunt detected by color flow Doppler.  LEFT VENTRICLE PLAX 2D LVIDd:         4.60 cm   Diastology LVIDs:         3.20 cm   LV e' medial:    5.55 cm/s LV PW:         1.20 cm   LV E/e' medial:  14.2 LV IVS:        1.30 cm   LV e' lateral:   8.16 cm/s LVOT diam:     2.00 cm   LV E/e' lateral: 9.6 LV SV:         71 LV SV Index:   29 LVOT Area:     3.14 cm  RIGHT VENTRICLE             IVC RV S prime:     16.10 cm/s  IVC diam: 0.90 cm TAPSE (M-mode): 2.4 cm RVSP:           28.6 mmHg LEFT ATRIUM           Index        RIGHT ATRIUM           Index LA diam:      4.60 cm 1.89 cm/m   RA Pressure: 3.00 mmHg LA Vol (A4C): 86.9 ml 35.74 ml/m  RA Area:     13.00 cm                                    RA Volume:   30.90 ml  12.71 ml/m  AORTIC VALVE AV Area (Vmax):    1.28 cm AV Area (Vmean):   1.41 cm AV Area (VTI):     1.40 cm AV Vmax:           229.00 cm/s AV Vmean:          154.500 cm/s AV VTI:            0.510 m AV Peak Grad:      21.0 mmHg AV Mean Grad:      11.0 mmHg LVOT Vmax:  93.40 cm/s LVOT Vmean:        69.400 cm/s LVOT VTI:          0.227 m LVOT/AV VTI ratio: 0.44  AORTA Ao Root diam: 3.50 cm Ao Asc diam:  3.10 cm MITRAL  VALVE                TRICUSPID VALVE MV Area (PHT): 2.46 cm     TR Peak grad:   25.6 mmHg MV Decel Time: 308 msec     TR Vmax:        253.00 cm/s MV E velocity: 78.60 cm/s   Estimated RAP:  3.00 mmHg MV A velocity: 104.00 cm/s  RVSP:           28.6 mmHg MV E/A ratio:  0.76                             SHUNTS                             Systemic VTI:  0.23 m                             Systemic Diam: 2.00 cm Mihai Croitoru MD Electronically signed by Jerel Balding MD Signature Date/Time: 07/01/2024/1:10:29 PM    Final    LONG TERM MONITOR (3-14 DAYS) Result Date: 06/30/2024 Patch Wear Time:  13 days and 23 hours (2025-10-25T09:05:00-0400 to 2025-11-08T08:05:00-0500) Patient had a min HR of 52 bpm, max HR of 162 bpm, and avg HR of 77 bpm. Predominant underlying rhythm was Sinus Rhythm. 3 Supraventricular Tachycardia runs occurred, the run with the fastest interval lasting 14 beats with a max rate of 162 bpm (avg 143 bpm); the run with the fastest interval was also the longest. Isolated SVEs were rare (<1.0%), SVE Couplets were rare (<1.0%), and SVE Triplets were rare (<1.0%). Isolated VEs were rare (<1.0%), VE Couplets were rare (<1.0%), and no VE Triplets were present. Symptoms associated with sinus rhythm. Conclusion: Study showed evidence of rare asymptomatic paroxysmal supraventricular tachycardia    Recent Labs: Lab Results  Component Value Date   WBC 8.4 05/29/2024   HGB 13.0 05/29/2024   PLT 267.0 05/29/2024   NA 140 05/29/2024   K 4.1 05/29/2024   CL 105 05/29/2024   CO2 28 05/29/2024   GLUCOSE 108 (H) 05/29/2024   BUN 12 05/29/2024   CREATININE 0.63 05/29/2024   BILITOT 0.6 05/29/2024   ALKPHOS 53 05/29/2024   AST 17 05/29/2024   ALT 14 05/29/2024   PROT 7.8 05/29/2024   ALBUMIN 4.2 05/29/2024   CALCIUM 9.5 05/29/2024   GFRAA 114 01/18/2021   QFTBGOLDPLUS NEGATIVE 03/12/2024    Speciality Comments: Prior therapy includes: MTX (d/c due to ILD progresssion)  Procedures:  No  procedures performed Allergies: Sulfa antibiotics and Sulfonamide derivatives     Assessment / Plan:     Visit Diagnoses: Rheumatoid arthritis involving multiple sites with positive rheumatoid factor (HCC) - +RF, +CCP: Patient has noted some increased arthralgias and joint stiffness especially first thing in the mornings which she attributes to colder weather temperatures.  She has no synovitis on examination today.  She has been applying Voltaren  gel topically as needed for pain relief.  She remains on Enbrel  50 mg subcutaneous injections once weekly and is taking Imuran  100 mg daily.  She is tolerating  combination therapy without any side effects and has not had any gaps in therapy.  No recent or recurrent infections.  No medication changes will be made at this time.  She was advised to notify us  if she develops any signs or symptoms of a flare.  She will follow-up in the office in 5 months or sooner if needed.  High risk medication use - Enbrel  50 mg sq injections once weekly (02/18/20-1st injection) and Imuran  100 mg by mouth daily. d/c MTX due to progressioin of ILD. CBC and CMP updated 05/29/24.  She plans on returning for updated lab work at the end of January and every 3 months to monitor for drug toxicity. TB gold negative on 03/12/24.  No recent or recurrent infections.  Discussed the importance of holding enbrel  and imuran  if she develops signs or symptoms of an infection and to resume once the infection has completely cleared.   Polymyositis (HCC) - Dx by Dr. Everlean 2007, CK 801, Jo-1 positive: CK has been within normal limits: She is not exhibiting any signs or symptoms of a polymyositis flare at this time.  She has full-strength of upper and lower extremities on examination today.  CK WNL on 03/12/24. CK will be rechecked today.   - Plan: CK  Jo-1 antibody positive  ILD (interstitial lung disease) (HCC) - She is followed by Dr. Shellia at [facility].  Completed pulmonary  rehab.  Bronchiectasis without complication [facility]): She is using the chest vest twice daily.  No recent exacerbations or antibiotic use.  Bilateral carpal tunnel syndrome: Not currently symptomatic.  Raynaud's syndrome without gangrene: No signs or symptoms of sclerodactyly.  No digital ulcerations.  Primary osteoarthritis of both hands: PIP and DIP thickening consistent with osteoarthritis of both hands.  No synovitis.  She has noted some increased arthralgias and joint stiffness but has been using Voltaren  gel topically as needed for pain relief.  Primary osteoarthritis of both feet: Ankle joints have good range of motion with no tenderness or joint swelling.  Fibromyalgia: She has intermittent myalgias and muscle tenderness due to fibromyalgia.  She has been taking gabapentin  300 mg at bedtime, Cymbalta  90 mg daily, and methocarbamol  500 mg 1 tablet every 8 hours as needed.  Discussed the importance of regular exercise and good sleep hygiene.  Trapezius muscle spasm - She takes methocarbamol  500 mg 1 tablet every 8 hours as needed for muscle spasms.  Other fatigue: Patient has noticed an increased fatigue recently.  Discussed the importance of regular exercise and good sleep hygiene.  History of osteopenia - Followed by gynecologist.  She is taking fosamax 70 mg 1 tablet by mouth once weekly.  She is also taking vitamin D  5000 units daily.  Vitamin D  deficiency -Vitamin D  was 29.39 on 05/29/2024.  Patient has increased vitamin D  to 5000 units daily.  Plan to recheck vitamin D  with her next lab work at the end of January.  Future order placed today for plan: VITAMIN D  25 Hydroxy (Vit-D Deficiency, Fractures)  Other medical conditions are listed as follows:   Fibromuscular dysplasia  Erythema nodosum  History of migraine  History of mitral valve prolapse  Orders: Orders Placed This Encounter  Procedures   CK   VITAMIN D  25 Hydroxy (Vit-D Deficiency, Fractures)   No orders of the  defined types were placed in this encounter.    Follow-Up Instructions: Return in about 3 months (around 10/07/2024) for Rheumatoid arthritis.   Waddell CHRISTELLA Craze, PA-C  Note - This  record has been created using [employer].  Chart creation errors have been sought, but may not always  have been located. Such creation errors do not reflect on  the standard of medical care.

## 2024-06-30 DIAGNOSIS — R002 Palpitations: Secondary | ICD-10-CM | POA: Diagnosis not present

## 2024-07-01 ENCOUNTER — Ambulatory Visit (HOSPITAL_COMMUNITY)
Admission: RE | Admit: 2024-07-01 | Discharge: 2024-07-01 | Disposition: A | Discharge status: Home / Self Care | Source: Ambulatory Visit | Attending: Cardiology | Admitting: Cardiology

## 2024-07-01 DIAGNOSIS — R011 Cardiac murmur, unspecified: Secondary | ICD-10-CM | POA: Insufficient documentation

## 2024-07-01 LAB — ECHOCARDIOGRAM COMPLETE
AR max vel: 1.28 cm2
AV Area VTI: 1.4 cm2
AV Area mean vel: 1.41 cm2
AV Mean grad: 11 mmHg
AV Peak grad: 21 mmHg
Ao pk vel: 2.29 m/s
Area-P 1/2: 2.46 cm2
S' Lateral: 3.2 cm

## 2024-07-03 ENCOUNTER — Ambulatory Visit: Payer: Self-pay | Admitting: Cardiology

## 2024-07-07 ENCOUNTER — Other Ambulatory Visit: Payer: Self-pay | Admitting: Physician Assistant

## 2024-07-07 DIAGNOSIS — M0579 Rheumatoid arthritis with rheumatoid factor of multiple sites without organ or systems involvement: Secondary | ICD-10-CM

## 2024-07-07 NOTE — Telephone Encounter (Signed)
 Last Fill: 04/08/2024  Labs: 05/29/2024 Glucose 108  TB Gold: 03/12/2024 Neg    Next Visit: 07/09/2024  Last Visit: 04/08/2024  IK:Myzlfjunpi arthritis involving multiple sites with positive rheumatoid factor   Current Dose per office note 04/08/2024: Enbrel  50 mg sq injections once weekly   Okay to refill Enbrel ?

## 2024-07-09 ENCOUNTER — Encounter: Payer: Self-pay | Admitting: Physician Assistant

## 2024-07-09 ENCOUNTER — Ambulatory Visit: Attending: Physician Assistant | Admitting: Physician Assistant

## 2024-07-09 VITALS — BP 128/81 | HR 62 | Temp 97.0°F | Resp 14 | Ht 68.0 in | Wt 299.4 lb

## 2024-07-09 DIAGNOSIS — M19042 Primary osteoarthritis, left hand: Secondary | ICD-10-CM

## 2024-07-09 DIAGNOSIS — Z8739 Personal history of other diseases of the musculoskeletal system and connective tissue: Secondary | ICD-10-CM

## 2024-07-09 DIAGNOSIS — Z79899 Other long term (current) drug therapy: Secondary | ICD-10-CM

## 2024-07-09 DIAGNOSIS — M797 Fibromyalgia: Secondary | ICD-10-CM | POA: Diagnosis not present

## 2024-07-09 DIAGNOSIS — M0579 Rheumatoid arthritis with rheumatoid factor of multiple sites without organ or systems involvement: Secondary | ICD-10-CM | POA: Diagnosis not present

## 2024-07-09 DIAGNOSIS — G5603 Carpal tunnel syndrome, bilateral upper limbs: Secondary | ICD-10-CM

## 2024-07-09 DIAGNOSIS — M3320 Polymyositis, organ involvement unspecified: Secondary | ICD-10-CM

## 2024-07-09 DIAGNOSIS — M19071 Primary osteoarthritis, right ankle and foot: Secondary | ICD-10-CM | POA: Diagnosis not present

## 2024-07-09 DIAGNOSIS — R7689 Other specified abnormal immunological findings in serum: Secondary | ICD-10-CM | POA: Diagnosis not present

## 2024-07-09 DIAGNOSIS — R5383 Other fatigue: Secondary | ICD-10-CM

## 2024-07-09 DIAGNOSIS — M19041 Primary osteoarthritis, right hand: Secondary | ICD-10-CM

## 2024-07-09 DIAGNOSIS — Z8679 Personal history of other diseases of the circulatory system: Secondary | ICD-10-CM

## 2024-07-09 DIAGNOSIS — J849 Interstitial pulmonary disease, unspecified: Secondary | ICD-10-CM

## 2024-07-09 DIAGNOSIS — I7300 Raynaud's syndrome without gangrene: Secondary | ICD-10-CM | POA: Diagnosis not present

## 2024-07-09 DIAGNOSIS — L52 Erythema nodosum: Secondary | ICD-10-CM

## 2024-07-09 DIAGNOSIS — M19072 Primary osteoarthritis, left ankle and foot: Secondary | ICD-10-CM

## 2024-07-09 DIAGNOSIS — I773 Arterial fibromuscular dysplasia: Secondary | ICD-10-CM

## 2024-07-09 DIAGNOSIS — J479 Bronchiectasis, uncomplicated: Secondary | ICD-10-CM

## 2024-07-09 DIAGNOSIS — Z8669 Personal history of other diseases of the nervous system and sense organs: Secondary | ICD-10-CM

## 2024-07-09 DIAGNOSIS — E559 Vitamin D deficiency, unspecified: Secondary | ICD-10-CM

## 2024-07-09 DIAGNOSIS — M62838 Other muscle spasm: Secondary | ICD-10-CM

## 2024-07-09 NOTE — Patient Instructions (Signed)
 Standing Labs We placed an order today for your standing lab work. Please have your standing labs drawn at the end of January and every 3 months    Please have your labs drawn 2 weeks prior to your appointment so that the provider can discuss your lab results at your appointment, if possible.  Please note that you may see your imaging and lab results in MyChart before we have reviewed them. We will contact you once all results are reviewed. Please allow our office up to 72 hours to thoroughly review all of the results before contacting the office for clarification of your results.  WALK-IN LAB HOURS  Monday through Thursday from 8:00 am - 4:30 pm and Friday from 8:00 am-12:00 pm.  Patients with office visits requiring labs will be seen before walk-in labs.  You may encounter longer than normal wait times. Please allow additional time. Wait times may be shorter on  Monday and Thursday afternoons.  We do not book appointments for walk-in labs. We appreciate your patience and understanding with our staff.   Labs are drawn by [facility]. Please bring your co-pay at the time of your lab draw.  You may receive a bill from [facility] for your lab work.  Please note if you are on Hydroxychloroquine and and an order has been placed for a Hydroxychloroquine level,  you will need to have it drawn 4 hours or more after your last dose.  If you wish to have your labs drawn at another location, please call the office 24 hours in advance so we can fax the orders.  The office is located at [address], [place], [place] [address]   If you have any questions regarding directions or hours of operation,  please call [phone].   As a reminder, please drink plenty of water prior to coming for your lab work. Thanks!

## 2024-07-10 ENCOUNTER — Ambulatory Visit: Payer: Self-pay | Admitting: Rheumatology

## 2024-07-10 LAB — CK: Total CK: 62 U/L (ref 21–240)

## 2024-07-10 NOTE — Progress Notes (Signed)
 CK normal

## 2024-07-23 ENCOUNTER — Other Ambulatory Visit: Payer: Self-pay | Admitting: Internal Medicine

## 2024-08-17 ENCOUNTER — Telehealth: Payer: Self-pay

## 2024-08-17 NOTE — Telephone Encounter (Signed)
 Received fax from [employer] requesting 04/2024 OV notes. Pt has not been seen by us  since 06/2023, but was seen by Dr. Shellia at [facility] on 04/2024.  Called and spoke to pt, and confirmed that she is being followed by Dr. Shellia now for her OSA.  Faxed back to [employer] letting them know that pt has not been seen since 2024 and is now seeing Dr. Shellia. Fax confirmation received, NFN.

## 2024-08-20 ENCOUNTER — Other Ambulatory Visit: Payer: Self-pay | Admitting: Adult Health

## 2024-08-27 ENCOUNTER — Telehealth: Payer: Self-pay | Admitting: Pharmacist

## 2024-08-27 NOTE — Telephone Encounter (Signed)
 Submitted a Prior Authorization request to [employer] for ENBREL  via CoverMyMeds. Will update once we receive a response.  Key: [id number]    Sherry Pennant, PharmD, MPH, BCPS, CPP Clinical Pharmacist

## 2024-08-28 ENCOUNTER — Other Ambulatory Visit: Payer: Self-pay | Admitting: Rheumatology

## 2024-08-28 NOTE — Telephone Encounter (Signed)
 Received notification from [facility] regarding a prior authorization for ENBREL . Authorization has been APPROVED from 08/06/2024 to 08/27/2025. Approval letter sent to scan center.  Sherry Pennant, PharmD, MPH, BCPS, CPP Clinical Pharmacist

## 2024-08-30 NOTE — Telephone Encounter (Signed)
 Please advise patient that labs are due in January.

## 2024-08-31 NOTE — Telephone Encounter (Signed)
 Sent patient a clinical cytogeneticist message to remind her about labs.

## 2024-09-02 ENCOUNTER — Telehealth: Payer: Self-pay | Admitting: Adult Health

## 2024-09-02 ENCOUNTER — Encounter: Payer: Self-pay | Admitting: Adult Health

## 2024-09-02 ENCOUNTER — Other Ambulatory Visit: Payer: Self-pay | Admitting: *Deleted

## 2024-09-02 DIAGNOSIS — Z79899 Other long term (current) drug therapy: Secondary | ICD-10-CM

## 2024-09-02 DIAGNOSIS — M3320 Polymyositis, organ involvement unspecified: Secondary | ICD-10-CM

## 2024-09-02 DIAGNOSIS — E559 Vitamin D deficiency, unspecified: Secondary | ICD-10-CM

## 2024-09-02 DIAGNOSIS — M0579 Rheumatoid arthritis with rheumatoid factor of multiple sites without organ or systems involvement: Secondary | ICD-10-CM

## 2024-09-02 NOTE — Telephone Encounter (Signed)
 Provider out rescheduled

## 2024-09-03 ENCOUNTER — Ambulatory Visit: Payer: Self-pay | Admitting: Physician Assistant

## 2024-09-03 LAB — COMPREHENSIVE METABOLIC PANEL WITH GFR
AG Ratio: 1.2 (calc) (ref 1.0–2.5)
ALT: 18 U/L (ref 6–29)
AST: 15 U/L (ref 10–35)
Albumin: 4.1 g/dL (ref 3.6–5.1)
Alkaline phosphatase (APISO): 68 U/L (ref 37–153)
BUN: 14 mg/dL (ref 7–25)
CO2: 29 mmol/L (ref 20–32)
Calcium: 9.7 mg/dL (ref 8.6–10.4)
Chloride: 105 mmol/L (ref 98–110)
Creat: 0.56 mg/dL (ref 0.50–1.03)
Globulin: 3.3 g/dL (ref 1.9–3.7)
Glucose, Bld: 118 mg/dL — ABNORMAL HIGH (ref 65–99)
Potassium: 4.6 mmol/L (ref 3.5–5.3)
Sodium: 141 mmol/L (ref 135–146)
Total Bilirubin: 0.4 mg/dL (ref 0.2–1.2)
Total Protein: 7.4 g/dL (ref 6.1–8.1)
eGFR: 106 mL/min/{1.73_m2}

## 2024-09-03 LAB — CBC WITH DIFFERENTIAL/PLATELET
Absolute Lymphocytes: 1640 {cells}/uL (ref 850–3900)
Absolute Monocytes: 750 {cells}/uL (ref 200–950)
Basophils Absolute: 50 {cells}/uL (ref 0–200)
Basophils Relative: 0.5 %
Eosinophils Absolute: 160 {cells}/uL (ref 15–500)
Eosinophils Relative: 1.6 %
HCT: 40.4 % (ref 35.9–46.0)
Hemoglobin: 13.1 g/dL (ref 11.7–15.5)
MCH: 28.1 pg (ref 27.0–33.0)
MCHC: 32.4 g/dL (ref 31.6–35.4)
MCV: 86.5 fL (ref 81.4–101.7)
MPV: 11.1 fL (ref 7.5–12.5)
Monocytes Relative: 7.5 %
Neutro Abs: 7400 {cells}/uL (ref 1500–7800)
Neutrophils Relative %: 74 %
Platelets: 281 10*3/uL (ref 140–400)
RBC: 4.67 Million/uL (ref 3.80–5.10)
RDW: 12.7 % (ref 11.0–15.0)
Total Lymphocyte: 16.4 %
WBC: 10 10*3/uL (ref 3.8–10.8)

## 2024-09-03 LAB — VITAMIN D 25 HYDROXY (VIT D DEFICIENCY, FRACTURES): Vit D, 25-Hydroxy: 51 ng/mL (ref 30–100)

## 2024-09-03 LAB — CK: Total CK: 46 U/L (ref 21–240)

## 2024-09-03 NOTE — Progress Notes (Signed)
 Vitamin D WNL.

## 2024-09-17 ENCOUNTER — Ambulatory Visit: Admitting: Cardiology

## 2024-10-08 ENCOUNTER — Ambulatory Visit: Admitting: Physician Assistant

## 2024-11-27 ENCOUNTER — Ambulatory Visit: Admitting: Internal Medicine

## 2025-01-21 ENCOUNTER — Telehealth: Admitting: Adult Health

## 2025-01-28 ENCOUNTER — Telehealth: Admitting: Adult Health
